# Patient Record
Sex: Female | Born: 1935 | Race: White | Hispanic: No | Smoking: Former smoker
Health system: Southern US, Community
[De-identification: ages and names within clinical notes are randomized; demographics above are authoritative.]

## PROBLEM LIST (undated history)

## (undated) DIAGNOSIS — N289 Disorder of kidney and ureter, unspecified: Secondary | ICD-10-CM

## (undated) DIAGNOSIS — K449 Diaphragmatic hernia without obstruction or gangrene: Secondary | ICD-10-CM

## (undated) DIAGNOSIS — M1990 Unspecified osteoarthritis, unspecified site: Secondary | ICD-10-CM

## (undated) DIAGNOSIS — Z87891 Personal history of nicotine dependence: Secondary | ICD-10-CM

## (undated) DIAGNOSIS — I4891 Unspecified atrial fibrillation: Secondary | ICD-10-CM

## (undated) DIAGNOSIS — D518 Other vitamin B12 deficiency anemias: Secondary | ICD-10-CM

## (undated) DIAGNOSIS — E039 Hypothyroidism, unspecified: Secondary | ICD-10-CM

## (undated) DIAGNOSIS — M8580 Other specified disorders of bone density and structure, unspecified site: Secondary | ICD-10-CM

## (undated) DIAGNOSIS — Z860100 Personal history of colon polyps, unspecified: Secondary | ICD-10-CM

## (undated) DIAGNOSIS — R413 Other amnesia: Secondary | ICD-10-CM

## (undated) DIAGNOSIS — G4733 Obstructive sleep apnea (adult) (pediatric): Secondary | ICD-10-CM

## (undated) DIAGNOSIS — K219 Gastro-esophageal reflux disease without esophagitis: Secondary | ICD-10-CM

## (undated) DIAGNOSIS — D649 Anemia, unspecified: Secondary | ICD-10-CM

## (undated) DIAGNOSIS — Z9989 Dependence on other enabling machines and devices: Secondary | ICD-10-CM

## (undated) DIAGNOSIS — C50919 Malignant neoplasm of unspecified site of unspecified female breast: Secondary | ICD-10-CM

## (undated) DIAGNOSIS — E669 Obesity, unspecified: Secondary | ICD-10-CM

## (undated) DIAGNOSIS — E119 Type 2 diabetes mellitus without complications: Secondary | ICD-10-CM

## (undated) DIAGNOSIS — I10 Essential (primary) hypertension: Secondary | ICD-10-CM

## (undated) DIAGNOSIS — E785 Hyperlipidemia, unspecified: Secondary | ICD-10-CM

## (undated) DIAGNOSIS — I4892 Unspecified atrial flutter: Secondary | ICD-10-CM

## (undated) HISTORY — DX: Unspecified atrial flutter: I48.92

## (undated) HISTORY — DX: Other specified disorders of bone density and structure, unspecified site: M85.80

## (undated) HISTORY — DX: Obstructive sleep apnea (adult) (pediatric): G47.33

## (undated) HISTORY — DX: Other vitamin B12 deficiency anemias: D51.8

## (undated) HISTORY — DX: Anemia, unspecified: D64.9

## (undated) HISTORY — DX: Type 2 diabetes mellitus without complications: E11.9

## (undated) HISTORY — DX: Malignant neoplasm of unspecified site of unspecified female breast: C50.919

## (undated) HISTORY — PX: COLONOSCOPY: SHX174

## (undated) HISTORY — PX: CATARACT EXTRACTION: SUR2

## (undated) HISTORY — DX: Personal history of colonic polyps: Z86.010

## (undated) HISTORY — DX: Personal history of colon polyps, unspecified: Z86.0100

## (undated) HISTORY — DX: Obesity, unspecified: E66.9

## (undated) HISTORY — DX: Personal history of nicotine dependence: Z87.891

## (undated) HISTORY — DX: Hypothyroidism, unspecified: E03.9

## (undated) HISTORY — DX: Dependence on other enabling machines and devices: Z99.89

## (undated) HISTORY — DX: Essential (primary) hypertension: I10

## (undated) HISTORY — DX: Unspecified atrial fibrillation: I48.91

## (undated) HISTORY — DX: Disorder of kidney and ureter, unspecified: N28.9

## (undated) HISTORY — DX: Hyperlipidemia, unspecified: E78.5

---

## 1972-03-10 HISTORY — PX: BOWEL RESECTION: SHX1257

## 1972-03-10 HISTORY — PX: CHOLECYSTECTOMY: SHX55

## 1973-03-10 HISTORY — PX: TOTAL ABDOMINAL HYSTERECTOMY: SHX209

## 1997-12-25 ENCOUNTER — Ambulatory Visit (HOSPITAL_COMMUNITY): Admission: RE | Admit: 1997-12-25 | Discharge: 1997-12-25 | Payer: Self-pay | Admitting: Family Medicine

## 1998-02-07 ENCOUNTER — Ambulatory Visit (HOSPITAL_COMMUNITY): Admission: RE | Admit: 1998-02-07 | Discharge: 1998-02-07 | Payer: Self-pay | Admitting: Family Medicine

## 1998-04-11 ENCOUNTER — Ambulatory Visit (HOSPITAL_COMMUNITY): Admission: RE | Admit: 1998-04-11 | Discharge: 1998-04-11 | Payer: Self-pay | Admitting: Family Medicine

## 1999-03-11 DIAGNOSIS — C50919 Malignant neoplasm of unspecified site of unspecified female breast: Secondary | ICD-10-CM

## 1999-03-11 HISTORY — DX: Malignant neoplasm of unspecified site of unspecified female breast: C50.919

## 1999-03-11 HISTORY — PX: MASTECTOMY: SHX3

## 1999-03-22 ENCOUNTER — Encounter: Admission: RE | Admit: 1999-03-22 | Discharge: 1999-03-22 | Payer: Self-pay | Admitting: Family Medicine

## 1999-03-22 ENCOUNTER — Encounter: Payer: Self-pay | Admitting: Family Medicine

## 1999-04-02 ENCOUNTER — Encounter: Payer: Self-pay | Admitting: General Surgery

## 1999-04-02 ENCOUNTER — Ambulatory Visit (HOSPITAL_COMMUNITY): Admission: RE | Admit: 1999-04-02 | Discharge: 1999-04-02 | Payer: Self-pay | Admitting: General Surgery

## 1999-04-02 ENCOUNTER — Encounter (INDEPENDENT_AMBULATORY_CARE_PROVIDER_SITE_OTHER): Payer: Self-pay | Admitting: Specialist

## 1999-04-16 ENCOUNTER — Encounter: Admission: RE | Admit: 1999-04-16 | Discharge: 1999-04-16 | Payer: Self-pay | Admitting: General Surgery

## 1999-04-16 ENCOUNTER — Encounter: Payer: Self-pay | Admitting: General Surgery

## 1999-05-01 ENCOUNTER — Encounter: Payer: Self-pay | Admitting: General Surgery

## 1999-05-03 ENCOUNTER — Encounter: Payer: Self-pay | Admitting: General Surgery

## 1999-05-03 ENCOUNTER — Inpatient Hospital Stay (HOSPITAL_COMMUNITY): Admission: RE | Admit: 1999-05-03 | Discharge: 1999-05-05 | Payer: Self-pay | Admitting: General Surgery

## 1999-05-03 ENCOUNTER — Encounter (INDEPENDENT_AMBULATORY_CARE_PROVIDER_SITE_OTHER): Payer: Self-pay | Admitting: Specialist

## 1999-05-30 ENCOUNTER — Encounter: Payer: Self-pay | Admitting: *Deleted

## 1999-05-30 ENCOUNTER — Ambulatory Visit (HOSPITAL_COMMUNITY): Admission: RE | Admit: 1999-05-30 | Discharge: 1999-05-30 | Payer: Self-pay | Admitting: *Deleted

## 1999-06-04 ENCOUNTER — Ambulatory Visit (HOSPITAL_COMMUNITY): Admission: RE | Admit: 1999-06-04 | Discharge: 1999-06-04 | Payer: Self-pay | Admitting: General Surgery

## 1999-06-04 ENCOUNTER — Encounter: Payer: Self-pay | Admitting: General Surgery

## 2000-02-18 ENCOUNTER — Encounter: Admission: RE | Admit: 2000-02-18 | Discharge: 2000-02-18 | Payer: Self-pay | Admitting: *Deleted

## 2000-02-18 ENCOUNTER — Encounter: Payer: Self-pay | Admitting: *Deleted

## 2000-05-11 ENCOUNTER — Ambulatory Visit (HOSPITAL_COMMUNITY): Admission: RE | Admit: 2000-05-11 | Discharge: 2000-05-11 | Payer: Self-pay | Admitting: *Deleted

## 2000-05-11 ENCOUNTER — Encounter: Payer: Self-pay | Admitting: *Deleted

## 2000-08-21 ENCOUNTER — Encounter: Admission: RE | Admit: 2000-08-21 | Discharge: 2000-08-21 | Payer: Self-pay | Admitting: Family Medicine

## 2000-08-21 ENCOUNTER — Encounter: Payer: Self-pay | Admitting: Family Medicine

## 2000-10-22 ENCOUNTER — Ambulatory Visit (HOSPITAL_COMMUNITY): Admission: RE | Admit: 2000-10-22 | Discharge: 2000-10-22 | Payer: Self-pay | Admitting: Internal Medicine

## 2000-10-22 ENCOUNTER — Encounter (INDEPENDENT_AMBULATORY_CARE_PROVIDER_SITE_OTHER): Payer: Self-pay | Admitting: Specialist

## 2001-03-30 ENCOUNTER — Encounter (HOSPITAL_COMMUNITY): Payer: Self-pay | Admitting: Oncology

## 2001-03-30 ENCOUNTER — Ambulatory Visit (HOSPITAL_COMMUNITY): Admission: RE | Admit: 2001-03-30 | Discharge: 2001-03-30 | Payer: Self-pay | Admitting: Oncology

## 2002-05-31 ENCOUNTER — Encounter (HOSPITAL_COMMUNITY): Payer: Self-pay | Admitting: Oncology

## 2002-05-31 ENCOUNTER — Ambulatory Visit (HOSPITAL_COMMUNITY): Admission: RE | Admit: 2002-05-31 | Discharge: 2002-05-31 | Payer: Self-pay | Admitting: Oncology

## 2002-08-26 ENCOUNTER — Encounter: Payer: Self-pay | Admitting: Family Medicine

## 2002-08-26 ENCOUNTER — Encounter: Admission: RE | Admit: 2002-08-26 | Discharge: 2002-08-26 | Payer: Self-pay | Admitting: Family Medicine

## 2003-05-31 ENCOUNTER — Ambulatory Visit (HOSPITAL_COMMUNITY): Admission: RE | Admit: 2003-05-31 | Discharge: 2003-05-31 | Payer: Self-pay | Admitting: Oncology

## 2004-01-12 ENCOUNTER — Encounter: Admission: RE | Admit: 2004-01-12 | Discharge: 2004-01-24 | Payer: Self-pay | Admitting: Family Medicine

## 2004-05-28 ENCOUNTER — Ambulatory Visit (HOSPITAL_COMMUNITY): Admission: RE | Admit: 2004-05-28 | Discharge: 2004-05-28 | Payer: Self-pay | Admitting: Oncology

## 2004-05-28 ENCOUNTER — Ambulatory Visit: Payer: Self-pay | Admitting: Oncology

## 2004-05-29 ENCOUNTER — Encounter: Admission: RE | Admit: 2004-05-29 | Discharge: 2004-05-29 | Payer: Self-pay | Admitting: Family Medicine

## 2004-11-25 ENCOUNTER — Ambulatory Visit: Payer: Self-pay | Admitting: Oncology

## 2004-11-26 ENCOUNTER — Ambulatory Visit (HOSPITAL_COMMUNITY): Admission: RE | Admit: 2004-11-26 | Discharge: 2004-11-26 | Payer: Self-pay | Admitting: Oncology

## 2005-05-23 ENCOUNTER — Ambulatory Visit: Payer: Self-pay | Admitting: Oncology

## 2005-05-26 DIAGNOSIS — D518 Other vitamin B12 deficiency anemias: Secondary | ICD-10-CM

## 2005-05-26 HISTORY — DX: Other vitamin B12 deficiency anemias: D51.8

## 2005-07-08 ENCOUNTER — Ambulatory Visit: Payer: Self-pay | Admitting: Oncology

## 2005-09-01 ENCOUNTER — Ambulatory Visit: Payer: Self-pay | Admitting: Oncology

## 2006-05-20 ENCOUNTER — Ambulatory Visit: Payer: Self-pay | Admitting: Oncology

## 2006-05-25 ENCOUNTER — Ambulatory Visit (HOSPITAL_COMMUNITY): Admission: RE | Admit: 2006-05-25 | Discharge: 2006-05-25 | Payer: Self-pay | Admitting: Oncology

## 2006-05-25 LAB — COMPREHENSIVE METABOLIC PANEL
ALT: 9 U/L (ref 0–35)
AST: 11 U/L (ref 0–37)
Albumin: 3.5 g/dL (ref 3.5–5.2)
Alkaline Phosphatase: 43 U/L (ref 39–117)
BUN: 18 mg/dL (ref 6–23)
CO2: 25 mEq/L (ref 19–32)
Calcium: 8.3 mg/dL — ABNORMAL LOW (ref 8.4–10.5)
Chloride: 101 mEq/L (ref 96–112)
Creatinine, Ser: 1.37 mg/dL — ABNORMAL HIGH (ref 0.40–1.20)
Glucose, Bld: 114 mg/dL — ABNORMAL HIGH (ref 70–99)
Potassium: 4 mEq/L (ref 3.5–5.3)
Sodium: 138 mEq/L (ref 135–145)
Total Bilirubin: 0.6 mg/dL (ref 0.3–1.2)
Total Protein: 6.1 g/dL (ref 6.0–8.3)

## 2006-05-25 LAB — CBC WITH DIFFERENTIAL/PLATELET
BASO%: 0.6 % (ref 0.0–2.0)
Basophils Absolute: 0 10*3/uL (ref 0.0–0.1)
EOS%: 2.6 % (ref 0.0–7.0)
Eosinophils Absolute: 0.1 10*3/uL (ref 0.0–0.5)
HCT: 32.6 % — ABNORMAL LOW (ref 34.8–46.6)
HGB: 11.1 g/dL — ABNORMAL LOW (ref 11.6–15.9)
LYMPH%: 16.8 % (ref 14.0–48.0)
MCH: 31.2 pg (ref 26.0–34.0)
MCHC: 34.2 g/dL (ref 32.0–36.0)
MCV: 91.2 fL (ref 81.0–101.0)
MONO#: 0.4 10*3/uL (ref 0.1–0.9)
MONO%: 10.2 % (ref 0.0–13.0)
NEUT#: 2.9 10*3/uL (ref 1.5–6.5)
NEUT%: 69.8 % (ref 39.6–76.8)
Platelets: 250 10*3/uL (ref 145–400)
RBC: 3.57 10*6/uL — ABNORMAL LOW (ref 3.70–5.32)
RDW: 15.5 % — ABNORMAL HIGH (ref 11.3–14.5)
WBC: 4.1 10*3/uL (ref 3.9–10.0)
lymph#: 0.7 10*3/uL — ABNORMAL LOW (ref 0.9–3.3)

## 2006-05-25 LAB — LACTATE DEHYDROGENASE: LDH: 205 U/L (ref 94–250)

## 2006-05-25 LAB — VITAMIN B12: Vitamin B-12: 210 pg/mL — ABNORMAL LOW (ref 211–911)

## 2006-07-16 ENCOUNTER — Ambulatory Visit: Payer: Self-pay | Admitting: Oncology

## 2006-09-09 ENCOUNTER — Ambulatory Visit: Payer: Self-pay | Admitting: Oncology

## 2006-11-05 ENCOUNTER — Ambulatory Visit: Payer: Self-pay | Admitting: Oncology

## 2006-11-23 LAB — LACTATE DEHYDROGENASE: LDH: 179 U/L (ref 94–250)

## 2006-11-23 LAB — COMPREHENSIVE METABOLIC PANEL
ALT: 9 U/L (ref 0–35)
AST: 12 U/L (ref 0–37)
Albumin: 3.7 g/dL (ref 3.5–5.2)
Alkaline Phosphatase: 38 U/L — ABNORMAL LOW (ref 39–117)
BUN: 29 mg/dL — ABNORMAL HIGH (ref 6–23)
CO2: 21 mEq/L (ref 19–32)
Calcium: 8.7 mg/dL (ref 8.4–10.5)
Chloride: 103 mEq/L (ref 96–112)
Creatinine, Ser: 1.43 mg/dL — ABNORMAL HIGH (ref 0.40–1.20)
Glucose, Bld: 114 mg/dL — ABNORMAL HIGH (ref 70–99)
Potassium: 4.8 mEq/L (ref 3.5–5.3)
Sodium: 138 mEq/L (ref 135–145)
Total Bilirubin: 0.5 mg/dL (ref 0.3–1.2)
Total Protein: 6.3 g/dL (ref 6.0–8.3)

## 2006-11-23 LAB — CBC WITH DIFFERENTIAL/PLATELET
BASO%: 0.4 % (ref 0.0–2.0)
Basophils Absolute: 0 10*3/uL (ref 0.0–0.1)
EOS%: 1.9 % (ref 0.0–7.0)
Eosinophils Absolute: 0.1 10*3/uL (ref 0.0–0.5)
HCT: 32.8 % — ABNORMAL LOW (ref 34.8–46.6)
HGB: 11.3 g/dL — ABNORMAL LOW (ref 11.6–15.9)
LYMPH%: 13.4 % — ABNORMAL LOW (ref 14.0–48.0)
MCH: 31.3 pg (ref 26.0–34.0)
MCHC: 34.3 g/dL (ref 32.0–36.0)
MCV: 91.4 fL (ref 81.0–101.0)
MONO#: 0.4 10*3/uL (ref 0.1–0.9)
MONO%: 8.1 % (ref 0.0–13.0)
NEUT#: 3.5 10*3/uL (ref 1.5–6.5)
NEUT%: 76.2 % (ref 39.6–76.8)
Platelets: 255 10*3/uL (ref 145–400)
RBC: 3.59 10*6/uL — ABNORMAL LOW (ref 3.70–5.32)
RDW: 15.8 % — ABNORMAL HIGH (ref 11.3–14.5)
WBC: 4.6 10*3/uL (ref 3.9–10.0)
lymph#: 0.6 10*3/uL — ABNORMAL LOW (ref 0.9–3.3)

## 2006-11-23 LAB — VITAMIN B12: Vitamin B-12: 622 pg/mL (ref 211–911)

## 2007-01-01 ENCOUNTER — Ambulatory Visit: Payer: Self-pay | Admitting: Oncology

## 2007-03-02 ENCOUNTER — Ambulatory Visit: Payer: Self-pay | Admitting: Oncology

## 2007-04-01 ENCOUNTER — Encounter: Admission: RE | Admit: 2007-04-01 | Discharge: 2007-04-01 | Payer: Self-pay | Admitting: Family Medicine

## 2007-04-23 ENCOUNTER — Ambulatory Visit: Payer: Self-pay | Admitting: Oncology

## 2007-05-06 ENCOUNTER — Inpatient Hospital Stay (HOSPITAL_COMMUNITY): Admission: EM | Admit: 2007-05-06 | Discharge: 2007-05-11 | Payer: Self-pay | Admitting: Emergency Medicine

## 2007-05-25 LAB — CBC WITH DIFFERENTIAL/PLATELET
BASO%: 0.2 % (ref 0.0–2.0)
Basophils Absolute: 0 10*3/uL (ref 0.0–0.1)
EOS%: 0.8 % (ref 0.0–7.0)
Eosinophils Absolute: 0 10*3/uL (ref 0.0–0.5)
HCT: 34.7 % — ABNORMAL LOW (ref 34.8–46.6)
HGB: 11.8 g/dL (ref 11.6–15.9)
LYMPH%: 19.9 % (ref 14.0–48.0)
MCH: 30.7 pg (ref 26.0–34.0)
MCHC: 33.9 g/dL (ref 32.0–36.0)
MCV: 90.5 fL (ref 81.0–101.0)
MONO#: 0.6 10*3/uL (ref 0.1–0.9)
MONO%: 13 % (ref 0.0–13.0)
NEUT#: 2.8 10*3/uL (ref 1.5–6.5)
NEUT%: 66.1 % (ref 39.6–76.8)
Platelets: 357 10*3/uL (ref 145–400)
RBC: 3.84 10*6/uL (ref 3.70–5.32)
RDW: 15.3 % — ABNORMAL HIGH (ref 11.3–14.5)
WBC: 4.3 10*3/uL (ref 3.9–10.0)
lymph#: 0.9 10*3/uL (ref 0.9–3.3)

## 2007-05-25 LAB — COMPREHENSIVE METABOLIC PANEL
ALT: 16 U/L (ref 0–35)
AST: 21 U/L (ref 0–37)
Albumin: 3.2 g/dL — ABNORMAL LOW (ref 3.5–5.2)
Alkaline Phosphatase: 63 U/L (ref 39–117)
BUN: 18 mg/dL (ref 6–23)
CO2: 20 mEq/L (ref 19–32)
Calcium: 7.1 mg/dL — ABNORMAL LOW (ref 8.4–10.5)
Chloride: 103 mEq/L (ref 96–112)
Creatinine, Ser: 1.16 mg/dL (ref 0.40–1.20)
Glucose, Bld: 124 mg/dL — ABNORMAL HIGH (ref 70–99)
Potassium: 4.5 mEq/L (ref 3.5–5.3)
Sodium: 136 mEq/L (ref 135–145)
Total Bilirubin: 0.5 mg/dL (ref 0.3–1.2)
Total Protein: 6 g/dL (ref 6.0–8.3)

## 2007-05-25 LAB — LACTATE DEHYDROGENASE: LDH: 264 U/L — ABNORMAL HIGH (ref 94–250)

## 2007-05-25 LAB — VITAMIN B12: Vitamin B-12: 1290 pg/mL — ABNORMAL HIGH (ref 211–911)

## 2007-06-05 ENCOUNTER — Encounter: Admission: RE | Admit: 2007-06-05 | Discharge: 2007-06-05 | Payer: Self-pay | Admitting: Family Medicine

## 2007-06-09 HISTORY — PX: NM MYOCAR PERF WALL MOTION: HXRAD629

## 2007-06-17 ENCOUNTER — Ambulatory Visit: Payer: Self-pay | Admitting: Oncology

## 2007-08-13 ENCOUNTER — Ambulatory Visit: Payer: Self-pay | Admitting: Oncology

## 2007-09-08 DIAGNOSIS — G4733 Obstructive sleep apnea (adult) (pediatric): Secondary | ICD-10-CM

## 2007-09-08 HISTORY — DX: Obstructive sleep apnea (adult) (pediatric): G47.33

## 2007-10-12 ENCOUNTER — Ambulatory Visit: Payer: Self-pay | Admitting: Oncology

## 2007-11-25 LAB — COMPREHENSIVE METABOLIC PANEL
ALT: 14 U/L (ref 0–35)
AST: 13 U/L (ref 0–37)
Albumin: 3.7 g/dL (ref 3.5–5.2)
Alkaline Phosphatase: 35 U/L — ABNORMAL LOW (ref 39–117)
BUN: 36 mg/dL — ABNORMAL HIGH (ref 6–23)
CO2: 27 mEq/L (ref 19–32)
Calcium: 8.7 mg/dL (ref 8.4–10.5)
Chloride: 99 mEq/L (ref 96–112)
Creatinine, Ser: 1.61 mg/dL — ABNORMAL HIGH (ref 0.40–1.20)
Glucose, Bld: 149 mg/dL — ABNORMAL HIGH (ref 70–99)
Potassium: 4.1 mEq/L (ref 3.5–5.3)
Sodium: 138 mEq/L (ref 135–145)
Total Bilirubin: 0.5 mg/dL (ref 0.3–1.2)
Total Protein: 6.1 g/dL (ref 6.0–8.3)

## 2007-11-25 LAB — CBC WITH DIFFERENTIAL/PLATELET
BASO%: 0.9 % (ref 0.0–2.0)
Basophils Absolute: 0 10*3/uL (ref 0.0–0.1)
EOS%: 2.4 % (ref 0.0–7.0)
Eosinophils Absolute: 0.1 10*3/uL (ref 0.0–0.5)
HCT: 30.3 % — ABNORMAL LOW (ref 34.8–46.6)
HGB: 10.3 g/dL — ABNORMAL LOW (ref 11.6–15.9)
LYMPH%: 23.2 % (ref 14.0–48.0)
MCH: 30.9 pg (ref 26.0–34.0)
MCHC: 33.9 g/dL (ref 32.0–36.0)
MCV: 91.2 fL (ref 81.0–101.0)
MONO#: 0.4 10*3/uL (ref 0.1–0.9)
MONO%: 11.3 % (ref 0.0–13.0)
NEUT#: 2.1 10*3/uL (ref 1.5–6.5)
NEUT%: 62.2 % (ref 39.6–76.8)
Platelets: 241 10*3/uL (ref 145–400)
RBC: 3.32 10*6/uL — ABNORMAL LOW (ref 3.70–5.32)
RDW: 15.4 % — ABNORMAL HIGH (ref 11.3–14.5)
WBC: 3.3 10*3/uL — ABNORMAL LOW (ref 3.9–10.0)
lymph#: 0.8 10*3/uL — ABNORMAL LOW (ref 0.9–3.3)

## 2007-11-25 LAB — VITAMIN B12: Vitamin B-12: 677 pg/mL (ref 211–911)

## 2007-11-25 LAB — LACTATE DEHYDROGENASE: LDH: 187 U/L (ref 94–250)

## 2007-12-03 ENCOUNTER — Ambulatory Visit: Payer: Self-pay | Admitting: Oncology

## 2008-02-01 ENCOUNTER — Ambulatory Visit: Payer: Self-pay | Admitting: Oncology

## 2008-03-24 ENCOUNTER — Ambulatory Visit: Payer: Self-pay | Admitting: Oncology

## 2008-05-19 ENCOUNTER — Ambulatory Visit: Payer: Self-pay | Admitting: Oncology

## 2008-05-23 ENCOUNTER — Ambulatory Visit (HOSPITAL_COMMUNITY): Admission: RE | Admit: 2008-05-23 | Discharge: 2008-05-23 | Payer: Self-pay | Admitting: Oncology

## 2008-05-23 LAB — CBC WITH DIFFERENTIAL/PLATELET
BASO%: 0.6 % (ref 0.0–2.0)
Basophils Absolute: 0 10*3/uL (ref 0.0–0.1)
EOS%: 2.7 % (ref 0.0–7.0)
Eosinophils Absolute: 0.1 10*3/uL (ref 0.0–0.5)
HCT: 35 % (ref 34.8–46.6)
HGB: 11.8 g/dL (ref 11.6–15.9)
LYMPH%: 23.1 % (ref 14.0–49.7)
MCH: 30.8 pg (ref 25.1–34.0)
MCHC: 33.8 g/dL (ref 31.5–36.0)
MCV: 91.2 fL (ref 79.5–101.0)
MONO#: 0.5 10*3/uL (ref 0.1–0.9)
MONO%: 10.1 % (ref 0.0–14.0)
NEUT#: 2.8 10*3/uL (ref 1.5–6.5)
NEUT%: 63.5 % (ref 38.4–76.8)
Platelets: 231 10*3/uL (ref 145–400)
RBC: 3.84 10*6/uL (ref 3.70–5.45)
RDW: 14.6 % — ABNORMAL HIGH (ref 11.2–14.5)
WBC: 4.5 10*3/uL (ref 3.9–10.3)
lymph#: 1 10*3/uL (ref 0.9–3.3)

## 2008-05-23 LAB — COMPREHENSIVE METABOLIC PANEL
ALT: 15 U/L (ref 0–35)
AST: 15 U/L (ref 0–37)
Albumin: 3.9 g/dL (ref 3.5–5.2)
Alkaline Phosphatase: 42 U/L (ref 39–117)
BUN: 40 mg/dL — ABNORMAL HIGH (ref 6–23)
CO2: 23 mEq/L (ref 19–32)
Calcium: 9 mg/dL (ref 8.4–10.5)
Chloride: 101 mEq/L (ref 96–112)
Creatinine, Ser: 1.71 mg/dL — ABNORMAL HIGH (ref 0.40–1.20)
Glucose, Bld: 160 mg/dL — ABNORMAL HIGH (ref 70–99)
Potassium: 4.1 mEq/L (ref 3.5–5.3)
Sodium: 139 mEq/L (ref 135–145)
Total Bilirubin: 0.5 mg/dL (ref 0.3–1.2)
Total Protein: 6.6 g/dL (ref 6.0–8.3)

## 2008-05-23 LAB — VITAMIN B12: Vitamin B-12: 710 pg/mL (ref 211–911)

## 2008-05-23 LAB — LACTATE DEHYDROGENASE: LDH: 187 U/L (ref 94–250)

## 2008-07-17 ENCOUNTER — Ambulatory Visit: Payer: Self-pay | Admitting: Oncology

## 2008-09-08 ENCOUNTER — Ambulatory Visit: Payer: Self-pay | Admitting: Oncology

## 2008-10-09 ENCOUNTER — Ambulatory Visit: Payer: Self-pay | Admitting: Oncology

## 2008-11-30 ENCOUNTER — Ambulatory Visit: Payer: Self-pay | Admitting: Oncology

## 2008-12-05 LAB — COMPREHENSIVE METABOLIC PANEL
ALT: 11 U/L (ref 0–35)
AST: 12 U/L (ref 0–37)
Albumin: 3.5 g/dL (ref 3.5–5.2)
Alkaline Phosphatase: 50 U/L (ref 39–117)
BUN: 32 mg/dL — ABNORMAL HIGH (ref 6–23)
CO2: 24 mEq/L (ref 19–32)
Calcium: 8.6 mg/dL (ref 8.4–10.5)
Chloride: 105 mEq/L (ref 96–112)
Creatinine, Ser: 2.25 mg/dL — ABNORMAL HIGH (ref 0.40–1.20)
Glucose, Bld: 148 mg/dL — ABNORMAL HIGH (ref 70–99)
Potassium: 4.7 mEq/L (ref 3.5–5.3)
Sodium: 142 mEq/L (ref 135–145)
Total Bilirubin: 0.5 mg/dL (ref 0.3–1.2)
Total Protein: 6.1 g/dL (ref 6.0–8.3)

## 2008-12-05 LAB — CBC WITH DIFFERENTIAL/PLATELET
BASO%: 0.5 % (ref 0.0–2.0)
Basophils Absolute: 0 10*3/uL (ref 0.0–0.1)
EOS%: 3 % (ref 0.0–7.0)
Eosinophils Absolute: 0.1 10*3/uL (ref 0.0–0.5)
HCT: 27.8 % — ABNORMAL LOW (ref 34.8–46.6)
HGB: 9.6 g/dL — ABNORMAL LOW (ref 11.6–15.9)
LYMPH%: 16.6 % (ref 14.0–49.7)
MCH: 31.7 pg (ref 25.1–34.0)
MCHC: 34.5 g/dL (ref 31.5–36.0)
MCV: 91.9 fL (ref 79.5–101.0)
MONO#: 0.5 10*3/uL (ref 0.1–0.9)
MONO%: 12.3 % (ref 0.0–14.0)
NEUT#: 2.8 10*3/uL (ref 1.5–6.5)
NEUT%: 67.6 % (ref 38.4–76.8)
Platelets: 208 10*3/uL (ref 145–400)
RBC: 3.02 10*6/uL — ABNORMAL LOW (ref 3.70–5.45)
RDW: 14.9 % — ABNORMAL HIGH (ref 11.2–14.5)
WBC: 4.2 10*3/uL (ref 3.9–10.3)
lymph#: 0.7 10*3/uL — ABNORMAL LOW (ref 0.9–3.3)

## 2008-12-05 LAB — LACTATE DEHYDROGENASE: LDH: 236 U/L (ref 94–250)

## 2008-12-05 LAB — VITAMIN B12: Vitamin B-12: 677 pg/mL (ref 211–911)

## 2008-12-08 ENCOUNTER — Encounter (HOSPITAL_COMMUNITY): Admission: RE | Admit: 2008-12-08 | Discharge: 2009-03-08 | Payer: Self-pay | Admitting: Nephrology

## 2009-01-02 ENCOUNTER — Ambulatory Visit: Payer: Self-pay | Admitting: Oncology

## 2009-02-23 ENCOUNTER — Ambulatory Visit: Payer: Self-pay | Admitting: Oncology

## 2009-03-26 ENCOUNTER — Ambulatory Visit: Payer: Self-pay | Admitting: Oncology

## 2009-05-18 ENCOUNTER — Ambulatory Visit: Payer: Self-pay | Admitting: Oncology

## 2009-05-20 ENCOUNTER — Emergency Department (HOSPITAL_COMMUNITY)
Admission: EM | Admit: 2009-05-20 | Discharge: 2009-05-21 | Payer: Self-pay | Source: Home / Self Care | Admitting: Emergency Medicine

## 2009-05-22 ENCOUNTER — Emergency Department (HOSPITAL_COMMUNITY): Admission: EM | Admit: 2009-05-22 | Discharge: 2009-05-22 | Payer: Self-pay | Admitting: Emergency Medicine

## 2009-05-28 ENCOUNTER — Inpatient Hospital Stay (HOSPITAL_COMMUNITY): Admission: EM | Admit: 2009-05-28 | Discharge: 2009-06-04 | Payer: Self-pay | Admitting: Orthopedic Surgery

## 2009-05-30 ENCOUNTER — Encounter (INDEPENDENT_AMBULATORY_CARE_PROVIDER_SITE_OTHER): Payer: Self-pay | Admitting: Orthopedic Surgery

## 2009-05-30 HISTORY — PX: BACK SURGERY: SHX140

## 2009-05-31 ENCOUNTER — Ambulatory Visit: Payer: Self-pay | Admitting: Physical Medicine & Rehabilitation

## 2009-07-05 ENCOUNTER — Inpatient Hospital Stay (HOSPITAL_COMMUNITY): Admission: EM | Admit: 2009-07-05 | Discharge: 2009-07-09 | Payer: Self-pay | Admitting: Emergency Medicine

## 2009-07-05 ENCOUNTER — Ambulatory Visit: Payer: Self-pay | Admitting: Surgery

## 2009-07-05 ENCOUNTER — Encounter (INDEPENDENT_AMBULATORY_CARE_PROVIDER_SITE_OTHER): Payer: Self-pay | Admitting: Internal Medicine

## 2009-07-06 ENCOUNTER — Encounter (INDEPENDENT_AMBULATORY_CARE_PROVIDER_SITE_OTHER): Payer: Self-pay | Admitting: Internal Medicine

## 2010-01-18 ENCOUNTER — Ambulatory Visit: Payer: Self-pay | Admitting: Oncology

## 2010-01-22 LAB — CBC WITH DIFFERENTIAL/PLATELET
BASO%: 0.5 % (ref 0.0–2.0)
Basophils Absolute: 0 10*3/uL (ref 0.0–0.1)
EOS%: 3.2 % (ref 0.0–7.0)
Eosinophils Absolute: 0.2 10*3/uL (ref 0.0–0.5)
HCT: 37.7 % (ref 34.8–46.6)
HGB: 12.7 g/dL (ref 11.6–15.9)
LYMPH%: 20.9 % (ref 14.0–49.7)
MCH: 30.5 pg (ref 25.1–34.0)
MCHC: 33.6 g/dL (ref 31.5–36.0)
MCV: 90.8 fL (ref 79.5–101.0)
MONO#: 0.5 10*3/uL (ref 0.1–0.9)
MONO%: 9 % (ref 0.0–14.0)
NEUT#: 3.9 10*3/uL (ref 1.5–6.5)
NEUT%: 66.4 % (ref 38.4–76.8)
Platelets: 249 10*3/uL (ref 145–400)
RBC: 4.15 10*6/uL (ref 3.70–5.45)
RDW: 13.7 % (ref 11.2–14.5)
WBC: 5.9 10*3/uL (ref 3.9–10.3)
lymph#: 1.2 10*3/uL (ref 0.9–3.3)

## 2010-01-22 LAB — LACTATE DEHYDROGENASE: LDH: 205 U/L (ref 94–250)

## 2010-01-22 LAB — COMPREHENSIVE METABOLIC PANEL
ALT: 17 U/L (ref 0–35)
AST: 20 U/L (ref 0–37)
Albumin: 4 g/dL (ref 3.5–5.2)
Alkaline Phosphatase: 95 U/L (ref 39–117)
BUN: 23 mg/dL (ref 6–23)
CO2: 27 mEq/L (ref 19–32)
Calcium: 9 mg/dL (ref 8.4–10.5)
Chloride: 101 mEq/L (ref 96–112)
Creatinine, Ser: 1.59 mg/dL — ABNORMAL HIGH (ref 0.40–1.20)
Glucose, Bld: 240 mg/dL — ABNORMAL HIGH (ref 70–99)
Potassium: 3.9 mEq/L (ref 3.5–5.3)
Sodium: 138 mEq/L (ref 135–145)
Total Bilirubin: 0.5 mg/dL (ref 0.3–1.2)
Total Protein: 6.8 g/dL (ref 6.0–8.3)

## 2010-01-22 LAB — VITAMIN B12: Vitamin B-12: 726 pg/mL (ref 211–911)

## 2010-02-07 HISTORY — PX: TRANSTHORACIC ECHOCARDIOGRAM: SHX275

## 2010-03-19 ENCOUNTER — Ambulatory Visit: Payer: Self-pay | Admitting: Oncology

## 2010-03-20 ENCOUNTER — Encounter
Admission: RE | Admit: 2010-03-20 | Discharge: 2010-04-09 | Payer: Self-pay | Source: Home / Self Care | Attending: Family Medicine | Admitting: Family Medicine

## 2010-04-16 ENCOUNTER — Encounter (HOSPITAL_BASED_OUTPATIENT_CLINIC_OR_DEPARTMENT_OTHER): Payer: Medicare Other | Admitting: Oncology

## 2010-04-16 DIAGNOSIS — E538 Deficiency of other specified B group vitamins: Secondary | ICD-10-CM

## 2010-04-25 ENCOUNTER — Encounter: Payer: Medicare Other | Attending: Family Medicine

## 2010-04-25 DIAGNOSIS — Z713 Dietary counseling and surveillance: Secondary | ICD-10-CM | POA: Insufficient documentation

## 2010-04-25 DIAGNOSIS — E119 Type 2 diabetes mellitus without complications: Secondary | ICD-10-CM | POA: Insufficient documentation

## 2010-05-02 ENCOUNTER — Encounter: Payer: Medicare Other | Attending: Family Medicine

## 2010-05-02 DIAGNOSIS — Z713 Dietary counseling and surveillance: Secondary | ICD-10-CM | POA: Insufficient documentation

## 2010-05-02 DIAGNOSIS — E119 Type 2 diabetes mellitus without complications: Secondary | ICD-10-CM | POA: Insufficient documentation

## 2010-05-14 ENCOUNTER — Encounter (HOSPITAL_BASED_OUTPATIENT_CLINIC_OR_DEPARTMENT_OTHER): Payer: Medicare Other | Admitting: Oncology

## 2010-05-14 DIAGNOSIS — E538 Deficiency of other specified B group vitamins: Secondary | ICD-10-CM

## 2010-05-23 ENCOUNTER — Ambulatory Visit: Payer: Self-pay

## 2010-05-28 LAB — BASIC METABOLIC PANEL
BUN: 26 mg/dL — ABNORMAL HIGH (ref 6–23)
BUN: 26 mg/dL — ABNORMAL HIGH (ref 6–23)
BUN: 27 mg/dL — ABNORMAL HIGH (ref 6–23)
BUN: 28 mg/dL — ABNORMAL HIGH (ref 6–23)
CO2: 25 mEq/L (ref 19–32)
CO2: 26 mEq/L (ref 19–32)
CO2: 26 mEq/L (ref 19–32)
CO2: 27 mEq/L (ref 19–32)
Calcium: 7.9 mg/dL — ABNORMAL LOW (ref 8.4–10.5)
Calcium: 8.1 mg/dL — ABNORMAL LOW (ref 8.4–10.5)
Calcium: 8.3 mg/dL — ABNORMAL LOW (ref 8.4–10.5)
Calcium: 8.4 mg/dL (ref 8.4–10.5)
Chloride: 100 mEq/L (ref 96–112)
Chloride: 105 mEq/L (ref 96–112)
Chloride: 100 mEq/L (ref 96–112)
Chloride: 103 mEq/L (ref 96–112)
Creatinine, Ser: 1.86 mg/dL — ABNORMAL HIGH (ref 0.4–1.2)
Creatinine, Ser: 1.94 mg/dL — ABNORMAL HIGH (ref 0.4–1.2)
Creatinine, Ser: 2.02 mg/dL — ABNORMAL HIGH (ref 0.4–1.2)
Creatinine, Ser: 2.24 mg/dL — ABNORMAL HIGH (ref 0.4–1.2)
GFR calc Af Amer: 31 mL/min — ABNORMAL LOW (ref >60)
GFR calc Af Amer: 32 mL/min — ABNORMAL LOW (ref >60)
GFR calc Af Amer: 26 mL/min — ABNORMAL LOW (ref >60)
GFR calc Af Amer: 29 mL/min — ABNORMAL LOW (ref >60)
GFR calc non Af Amer: 25 mL/min — ABNORMAL LOW (ref >60)
GFR calc non Af Amer: 27 mL/min — ABNORMAL LOW (ref >60)
GFR calc non Af Amer: 21 mL/min — ABNORMAL LOW (ref >60)
GFR calc non Af Amer: 24 mL/min — ABNORMAL LOW (ref >60)
Glucose, Bld: 138 mg/dL — ABNORMAL HIGH (ref 70–99)
Glucose, Bld: 92 mg/dL (ref 70–99)
Glucose, Bld: 110 mg/dL — ABNORMAL HIGH (ref 70–99)
Glucose, Bld: 146 mg/dL — ABNORMAL HIGH (ref 70–99)
Potassium: 4.6 mEq/L (ref 3.5–5.1)
Potassium: 5 mEq/L (ref 3.5–5.1)
Potassium: 4.1 mEq/L (ref 3.5–5.1)
Potassium: 4.1 mEq/L (ref 3.5–5.1)
Sodium: 134 mEq/L — ABNORMAL LOW (ref 135–145)
Sodium: 134 mEq/L — ABNORMAL LOW (ref 135–145)
Sodium: 134 mEq/L — ABNORMAL LOW (ref 135–145)
Sodium: 136 mEq/L (ref 135–145)

## 2010-05-28 LAB — GLUCOSE, CAPILLARY
Glucose-Capillary: 100 mg/dL — ABNORMAL HIGH (ref 70–99)
Glucose-Capillary: 140 mg/dL — ABNORMAL HIGH (ref 70–99)
Glucose-Capillary: 141 mg/dL — ABNORMAL HIGH (ref 70–99)
Glucose-Capillary: 171 mg/dL — ABNORMAL HIGH (ref 70–99)
Glucose-Capillary: 182 mg/dL — ABNORMAL HIGH (ref 70–99)
Glucose-Capillary: 74 mg/dL (ref 70–99)
Glucose-Capillary: 111 mg/dL — ABNORMAL HIGH (ref 70–99)
Glucose-Capillary: 113 mg/dL — ABNORMAL HIGH (ref 70–99)
Glucose-Capillary: 113 mg/dL — ABNORMAL HIGH (ref 70–99)
Glucose-Capillary: 132 mg/dL — ABNORMAL HIGH (ref 70–99)
Glucose-Capillary: 139 mg/dL — ABNORMAL HIGH (ref 70–99)
Glucose-Capillary: 151 mg/dL — ABNORMAL HIGH (ref 70–99)
Glucose-Capillary: 157 mg/dL — ABNORMAL HIGH (ref 70–99)
Glucose-Capillary: 159 mg/dL — ABNORMAL HIGH (ref 70–99)
Glucose-Capillary: 163 mg/dL — ABNORMAL HIGH (ref 70–99)
Glucose-Capillary: 168 mg/dL — ABNORMAL HIGH (ref 70–99)
Glucose-Capillary: 175 mg/dL — ABNORMAL HIGH (ref 70–99)
Glucose-Capillary: 212 mg/dL — ABNORMAL HIGH (ref 70–99)
Glucose-Capillary: 72 mg/dL (ref 70–99)
Glucose-Capillary: 77 mg/dL (ref 70–99)

## 2010-05-28 LAB — CBC
HCT: 26.3 % — ABNORMAL LOW (ref 36.0–46.0)
HCT: 24.8 % — ABNORMAL LOW (ref 36.0–46.0)
HCT: 25.9 % — ABNORMAL LOW (ref 36.0–46.0)
HCT: 28 % — ABNORMAL LOW (ref 36.0–46.0)
HCT: 28.8 % — ABNORMAL LOW (ref 36.0–46.0)
Hemoglobin: 8.7 g/dL — ABNORMAL LOW (ref 12.0–15.0)
Hemoglobin: 8.4 g/dL — ABNORMAL LOW (ref 12.0–15.0)
Hemoglobin: 8.6 g/dL — ABNORMAL LOW (ref 12.0–15.0)
Hemoglobin: 9.2 g/dL — ABNORMAL LOW (ref 12.0–15.0)
Hemoglobin: 9.8 g/dL — ABNORMAL LOW (ref 12.0–15.0)
MCHC: 33 g/dL (ref 30.0–36.0)
MCHC: 33 g/dL (ref 30.0–36.0)
MCHC: 33.3 g/dL (ref 30.0–36.0)
MCHC: 33.9 g/dL (ref 30.0–36.0)
MCHC: 34 g/dL (ref 30.0–36.0)
MCV: 93.9 fL (ref 78.0–100.0)
MCV: 92.8 fL (ref 78.0–100.0)
MCV: 92.8 fL (ref 78.0–100.0)
MCV: 93.2 fL (ref 78.0–100.0)
MCV: 93.6 fL (ref 78.0–100.0)
Platelets: 220 10*3/uL (ref 150–400)
Platelets: 209 10*3/uL (ref 150–400)
Platelets: 215 10*3/uL (ref 150–400)
Platelets: 245 10*3/uL (ref 150–400)
Platelets: 252 10*3/uL (ref 150–400)
RBC: 2.8 MIL/uL — ABNORMAL LOW (ref 3.87–5.11)
RBC: 2.67 MIL/uL — ABNORMAL LOW (ref 3.87–5.11)
RBC: 2.79 MIL/uL — ABNORMAL LOW (ref 3.87–5.11)
RBC: 3 MIL/uL — ABNORMAL LOW (ref 3.87–5.11)
RBC: 3.08 MIL/uL — ABNORMAL LOW (ref 3.87–5.11)
RDW: 17.5 % — ABNORMAL HIGH (ref 11.5–15.5)
RDW: 17.6 % — ABNORMAL HIGH (ref 11.5–15.5)
RDW: 17.6 % — ABNORMAL HIGH (ref 11.5–15.5)
RDW: 17.7 % — ABNORMAL HIGH (ref 11.5–15.5)
RDW: 17.9 % — ABNORMAL HIGH (ref 11.5–15.5)
WBC: 6.2 10*3/uL (ref 4.0–10.5)
WBC: 5.3 10*3/uL (ref 4.0–10.5)
WBC: 5.7 10*3/uL (ref 4.0–10.5)
WBC: 6.7 10*3/uL (ref 4.0–10.5)
WBC: 7.2 10*3/uL (ref 4.0–10.5)

## 2010-05-28 LAB — RETICULOCYTES
RBC.: 3.17 MIL/uL — ABNORMAL LOW (ref 3.87–5.11)
Retic Count, Absolute: 47.6 10*3/uL (ref 19.0–186.0)
Retic Ct Pct: 1.5 % (ref 0.4–3.1)

## 2010-05-28 LAB — DIFFERENTIAL
Basophils Absolute: 0 10*3/uL (ref 0.0–0.1)
Basophils Absolute: 0 10*3/uL (ref 0.0–0.1)
Basophils Relative: 0 % (ref 0–1)
Basophils Relative: 0 % (ref 0–1)
Eosinophils Absolute: 0.1 10*3/uL (ref 0.0–0.7)
Eosinophils Absolute: 0.1 10*3/uL (ref 0.0–0.7)
Eosinophils Relative: 1 % (ref 0–5)
Eosinophils Relative: 2 % (ref 0–5)
Lymphocytes Relative: 12 % (ref 12–46)
Lymphocytes Relative: 8 % — ABNORMAL LOW (ref 12–46)
Lymphs Abs: 0.5 10*3/uL — ABNORMAL LOW (ref 0.7–4.0)
Lymphs Abs: 0.8 10*3/uL (ref 0.7–4.0)
Monocytes Absolute: 0.7 10*3/uL (ref 0.1–1.0)
Monocytes Absolute: 1 10*3/uL (ref 0.1–1.0)
Monocytes Relative: 13 % — ABNORMAL HIGH (ref 3–12)
Monocytes Relative: 15 % — ABNORMAL HIGH (ref 3–12)
Neutro Abs: 4.4 10*3/uL (ref 1.7–7.7)
Neutro Abs: 4.8 10*3/uL (ref 1.7–7.7)
Neutrophils Relative %: 72 % (ref 43–77)
Neutrophils Relative %: 78 % — ABNORMAL HIGH (ref 43–77)

## 2010-05-28 LAB — URINALYSIS, ROUTINE W REFLEX MICROSCOPIC
Bilirubin Urine: NEGATIVE
Glucose, UA: NEGATIVE mg/dL
Hgb urine dipstick: NEGATIVE
Ketones, ur: NEGATIVE mg/dL
Nitrite: NEGATIVE
Protein, ur: NEGATIVE mg/dL
Specific Gravity, Urine: 1.007 (ref 1.005–1.030)
Urobilinogen, UA: 0.2 mg/dL (ref 0.0–1.0)
pH: 5.5 (ref 5.0–8.0)

## 2010-05-28 LAB — COMPREHENSIVE METABOLIC PANEL
ALT: 14 U/L (ref 0–35)
AST: 17 U/L (ref 0–37)
Albumin: 2.8 g/dL — ABNORMAL LOW (ref 3.5–5.2)
Alkaline Phosphatase: 55 U/L (ref 39–117)
BUN: 32 mg/dL — ABNORMAL HIGH (ref 6–23)
CO2: 26 mEq/L (ref 19–32)
Calcium: 8.5 mg/dL (ref 8.4–10.5)
Chloride: 100 mEq/L (ref 96–112)
Creatinine, Ser: 2.47 mg/dL — ABNORMAL HIGH (ref 0.4–1.2)
GFR calc Af Amer: 23 mL/min — ABNORMAL LOW (ref >60)
GFR calc non Af Amer: 19 mL/min — ABNORMAL LOW (ref >60)
Glucose, Bld: 148 mg/dL — ABNORMAL HIGH (ref 70–99)
Potassium: 3.9 mEq/L (ref 3.5–5.1)
Sodium: 134 mEq/L — ABNORMAL LOW (ref 135–145)
Total Bilirubin: 0.9 mg/dL (ref 0.3–1.2)
Total Protein: 5.3 g/dL — ABNORMAL LOW (ref 6.0–8.3)

## 2010-05-28 LAB — LIPID PANEL
Cholesterol: 115 mg/dL (ref 0–200)
HDL: 42 mg/dL (ref >39)
LDL Cholesterol: 51 mg/dL (ref 0–99)
Total CHOL/HDL Ratio: 2.7 RATIO
Triglycerides: 108 mg/dL (ref <150)
VLDL: 22 mg/dL (ref 0–40)

## 2010-05-28 LAB — HEMOGLOBIN A1C
Hgb A1c MFr Bld: 6.1 % — ABNORMAL HIGH (ref <5.7)
Mean Plasma Glucose: 128 mg/dL — ABNORMAL HIGH (ref <117)

## 2010-05-28 LAB — POCT CARDIAC MARKERS
CKMB, poc: <1 ng/mL — ABNORMAL LOW (ref 1.0–8.0)
Myoglobin, poc: 101 ng/mL (ref 12–200)
Troponin i, poc: <0.05 ng/mL (ref 0.00–0.09)

## 2010-05-28 LAB — CARDIAC PANEL(CRET KIN+CKTOT+MB+TROPI)
CK, MB: 0.8 ng/mL (ref 0.3–4.0)
CK, MB: 0.9 ng/mL (ref 0.3–4.0)
CK, MB: 0.9 ng/mL (ref 0.3–4.0)
Relative Index: INVALID (ref 0.0–2.5)
Relative Index: INVALID (ref 0.0–2.5)
Relative Index: INVALID (ref 0.0–2.5)
Total CK: 37 U/L (ref 7–177)
Total CK: 41 U/L (ref 7–177)
Total CK: 44 U/L (ref 7–177)
Troponin I: 0.01 ng/mL (ref 0.00–0.06)
Troponin I: 0.02 ng/mL (ref 0.00–0.06)
Troponin I: 0.02 ng/mL (ref 0.00–0.06)

## 2010-05-28 LAB — FOLATE: Folate: 9.9 ng/mL

## 2010-05-28 LAB — FERRITIN: Ferritin: 260 ng/mL (ref 10–291)

## 2010-05-28 LAB — IRON AND TIBC
Iron: 44 ug/dL (ref 42–135)
Saturation Ratios: 17 % — ABNORMAL LOW (ref 20–55)
TIBC: 265 ug/dL (ref 250–470)
UIBC: 221 ug/dL

## 2010-05-28 LAB — VITAMIN B12: Vitamin B-12: 463 pg/mL (ref 211–911)

## 2010-05-28 LAB — POCT I-STAT, CHEM 8
BUN: 37 mg/dL — ABNORMAL HIGH (ref 6–23)
Calcium, Ion: 1.09 mmol/L — ABNORMAL LOW (ref 1.12–1.32)
Chloride: 100 mEq/L (ref 96–112)
Creatinine, Ser: 2.7 mg/dL — ABNORMAL HIGH (ref 0.4–1.2)
Glucose, Bld: 173 mg/dL — ABNORMAL HIGH (ref 70–99)
HCT: 27 % — ABNORMAL LOW (ref 36.0–46.0)
Hemoglobin: 9.2 g/dL — ABNORMAL LOW (ref 12.0–15.0)
Potassium: 4.6 mEq/L (ref 3.5–5.1)
Sodium: 130 mEq/L — ABNORMAL LOW (ref 135–145)
TCO2: 23 mmol/L (ref 0–100)

## 2010-05-28 LAB — URINE CULTURE: Colony Count: >=100000

## 2010-05-28 LAB — HEMOCCULT GUIAC POC 1CARD (OFFICE): Fecal Occult Bld: NEGATIVE

## 2010-05-28 LAB — TSH: TSH: 3.045 u[IU]/mL (ref 0.350–4.500)

## 2010-05-28 LAB — BRAIN NATRIURETIC PEPTIDE: Pro B Natriuretic peptide (BNP): 202 pg/mL — ABNORMAL HIGH (ref 0.0–100.0)

## 2010-06-03 LAB — COMPREHENSIVE METABOLIC PANEL
ALT: 19 U/L (ref 0–35)
AST: 22 U/L (ref 0–37)
Albumin: 2.9 g/dL — ABNORMAL LOW (ref 3.5–5.2)
Alkaline Phosphatase: 115 U/L (ref 39–117)
BUN: 46 mg/dL — ABNORMAL HIGH (ref 6–23)
CO2: 26 mEq/L (ref 19–32)
Calcium: 9 mg/dL (ref 8.4–10.5)
Chloride: 95 mEq/L — ABNORMAL LOW (ref 96–112)
Creatinine, Ser: 2.05 mg/dL — ABNORMAL HIGH (ref 0.4–1.2)
GFR calc Af Amer: 29 mL/min — ABNORMAL LOW (ref >60)
GFR calc non Af Amer: 24 mL/min — ABNORMAL LOW (ref >60)
Glucose, Bld: 238 mg/dL — ABNORMAL HIGH (ref 70–99)
Potassium: 5.3 mEq/L — ABNORMAL HIGH (ref 3.5–5.1)
Sodium: 131 mEq/L — ABNORMAL LOW (ref 135–145)
Total Bilirubin: 0.8 mg/dL (ref 0.3–1.2)
Total Protein: 6.5 g/dL (ref 6.0–8.3)

## 2010-06-03 LAB — GLUCOSE, CAPILLARY
Glucose-Capillary: 100 mg/dL — ABNORMAL HIGH (ref 70–99)
Glucose-Capillary: 100 mg/dL — ABNORMAL HIGH (ref 70–99)
Glucose-Capillary: 105 mg/dL — ABNORMAL HIGH (ref 70–99)
Glucose-Capillary: 106 mg/dL — ABNORMAL HIGH (ref 70–99)
Glucose-Capillary: 112 mg/dL — ABNORMAL HIGH (ref 70–99)
Glucose-Capillary: 120 mg/dL — ABNORMAL HIGH (ref 70–99)
Glucose-Capillary: 125 mg/dL — ABNORMAL HIGH (ref 70–99)
Glucose-Capillary: 132 mg/dL — ABNORMAL HIGH (ref 70–99)
Glucose-Capillary: 133 mg/dL — ABNORMAL HIGH (ref 70–99)
Glucose-Capillary: 133 mg/dL — ABNORMAL HIGH (ref 70–99)
Glucose-Capillary: 137 mg/dL — ABNORMAL HIGH (ref 70–99)
Glucose-Capillary: 147 mg/dL — ABNORMAL HIGH (ref 70–99)
Glucose-Capillary: 149 mg/dL — ABNORMAL HIGH (ref 70–99)
Glucose-Capillary: 153 mg/dL — ABNORMAL HIGH (ref 70–99)
Glucose-Capillary: 158 mg/dL — ABNORMAL HIGH (ref 70–99)
Glucose-Capillary: 161 mg/dL — ABNORMAL HIGH (ref 70–99)
Glucose-Capillary: 171 mg/dL — ABNORMAL HIGH (ref 70–99)
Glucose-Capillary: 178 mg/dL — ABNORMAL HIGH (ref 70–99)
Glucose-Capillary: 179 mg/dL — ABNORMAL HIGH (ref 70–99)
Glucose-Capillary: 187 mg/dL — ABNORMAL HIGH (ref 70–99)
Glucose-Capillary: 189 mg/dL — ABNORMAL HIGH (ref 70–99)
Glucose-Capillary: 200 mg/dL — ABNORMAL HIGH (ref 70–99)
Glucose-Capillary: 211 mg/dL — ABNORMAL HIGH (ref 70–99)
Glucose-Capillary: 240 mg/dL — ABNORMAL HIGH (ref 70–99)
Glucose-Capillary: 252 mg/dL — ABNORMAL HIGH (ref 70–99)
Glucose-Capillary: 253 mg/dL — ABNORMAL HIGH (ref 70–99)
Glucose-Capillary: 92 mg/dL (ref 70–99)

## 2010-06-03 LAB — DIFFERENTIAL
Basophils Absolute: 0 10*3/uL (ref 0.0–0.1)
Basophils Relative: 0 % (ref 0–1)
Eosinophils Absolute: 0 10*3/uL (ref 0.0–0.7)
Eosinophils Relative: 1 % (ref 0–5)
Lymphocytes Relative: 8 % — ABNORMAL LOW (ref 12–46)
Lymphs Abs: 0.6 10*3/uL — ABNORMAL LOW (ref 0.7–4.0)
Monocytes Absolute: 0.6 10*3/uL (ref 0.1–1.0)
Monocytes Relative: 8 % (ref 3–12)
Neutro Abs: 6.1 10*3/uL (ref 1.7–7.7)
Neutrophils Relative %: 83 % — ABNORMAL HIGH (ref 43–77)

## 2010-06-03 LAB — BASIC METABOLIC PANEL
BUN: 23 mg/dL (ref 6–23)
CO2: 22 mEq/L (ref 19–32)
Calcium: 8.4 mg/dL (ref 8.4–10.5)
Chloride: 101 mEq/L (ref 96–112)
Creatinine, Ser: 1.53 mg/dL — ABNORMAL HIGH (ref 0.4–1.2)
GFR calc Af Amer: 40 mL/min — ABNORMAL LOW (ref >60)
GFR calc non Af Amer: 33 mL/min — ABNORMAL LOW (ref >60)
Glucose, Bld: 119 mg/dL — ABNORMAL HIGH (ref 70–99)
Potassium: 3.8 mEq/L (ref 3.5–5.1)
Sodium: 133 mEq/L — ABNORMAL LOW (ref 135–145)

## 2010-06-03 LAB — URINALYSIS, ROUTINE W REFLEX MICROSCOPIC
Glucose, UA: NEGATIVE mg/dL
Hgb urine dipstick: NEGATIVE
Ketones, ur: NEGATIVE mg/dL
Nitrite: NEGATIVE
Protein, ur: NEGATIVE mg/dL
Specific Gravity, Urine: 1.02 (ref 1.005–1.030)
Urobilinogen, UA: 0.2 mg/dL (ref 0.0–1.0)
pH: 5 (ref 5.0–8.0)

## 2010-06-03 LAB — URINE MICROSCOPIC-ADD ON

## 2010-06-03 LAB — CBC
HCT: 35.9 % — ABNORMAL LOW (ref 36.0–46.0)
HCT: 37.8 % (ref 36.0–46.0)
Hemoglobin: 11.8 g/dL — ABNORMAL LOW (ref 12.0–15.0)
Hemoglobin: 12.7 g/dL (ref 12.0–15.0)
MCHC: 33 g/dL (ref 30.0–36.0)
MCHC: 33.6 g/dL (ref 30.0–36.0)
MCV: 92.4 fL (ref 78.0–100.0)
MCV: 92.8 fL (ref 78.0–100.0)
Platelets: 267 10*3/uL (ref 150–400)
Platelets: 298 10*3/uL (ref 150–400)
RBC: 3.87 MIL/uL (ref 3.87–5.11)
RBC: 4.09 MIL/uL (ref 3.87–5.11)
RDW: 17.3 % — ABNORMAL HIGH (ref 11.5–15.5)
RDW: 17.5 % — ABNORMAL HIGH (ref 11.5–15.5)
WBC: 4.7 10*3/uL (ref 4.0–10.5)
WBC: 7.3 10*3/uL (ref 4.0–10.5)

## 2010-06-03 LAB — PROTIME-INR
INR: 1.11 (ref 0.00–1.49)
Prothrombin Time: 14.2 seconds (ref 11.6–15.2)

## 2010-06-03 LAB — APTT: aPTT: 36 seconds (ref 24–37)

## 2010-06-11 ENCOUNTER — Encounter (HOSPITAL_BASED_OUTPATIENT_CLINIC_OR_DEPARTMENT_OTHER): Payer: Medicare Other | Admitting: Oncology

## 2010-06-11 DIAGNOSIS — E538 Deficiency of other specified B group vitamins: Secondary | ICD-10-CM

## 2010-06-14 LAB — POCT HEMOGLOBIN-HEMACUE: Hemoglobin: 9.6 g/dL — ABNORMAL LOW (ref 12.0–15.0)

## 2010-07-12 ENCOUNTER — Other Ambulatory Visit (HOSPITAL_COMMUNITY): Payer: Self-pay | Admitting: Oncology

## 2010-07-12 ENCOUNTER — Ambulatory Visit (HOSPITAL_BASED_OUTPATIENT_CLINIC_OR_DEPARTMENT_OTHER): Payer: Medicare Other | Admitting: Oncology

## 2010-07-12 ENCOUNTER — Ambulatory Visit (HOSPITAL_COMMUNITY)
Admission: RE | Admit: 2010-07-12 | Discharge: 2010-07-12 | Disposition: A | Discharge status: Home / Self Care | Payer: Medicare Other | Source: Ambulatory Visit | Attending: Oncology | Admitting: Oncology

## 2010-07-12 DIAGNOSIS — C50919 Malignant neoplasm of unspecified site of unspecified female breast: Secondary | ICD-10-CM | POA: Insufficient documentation

## 2010-07-12 DIAGNOSIS — E538 Deficiency of other specified B group vitamins: Secondary | ICD-10-CM

## 2010-07-12 DIAGNOSIS — I10 Essential (primary) hypertension: Secondary | ICD-10-CM | POA: Insufficient documentation

## 2010-07-12 DIAGNOSIS — Z87891 Personal history of nicotine dependence: Secondary | ICD-10-CM | POA: Insufficient documentation

## 2010-07-12 LAB — COMPREHENSIVE METABOLIC PANEL
ALT: 16 U/L (ref 0–35)
AST: 19 U/L (ref 0–37)
Albumin: 4.1 g/dL (ref 3.5–5.2)
Alkaline Phosphatase: 75 U/L (ref 39–117)
BUN: 24 mg/dL — ABNORMAL HIGH (ref 6–23)
CO2: 29 mEq/L (ref 19–32)
Calcium: 8.9 mg/dL (ref 8.4–10.5)
Chloride: 97 mEq/L (ref 96–112)
Creatinine, Ser: 1.62 mg/dL — ABNORMAL HIGH (ref 0.40–1.20)
Glucose, Bld: 231 mg/dL — ABNORMAL HIGH (ref 70–99)
Potassium: 3.8 mEq/L (ref 3.5–5.3)
Sodium: 136 mEq/L (ref 135–145)
Total Bilirubin: 0.7 mg/dL (ref 0.3–1.2)
Total Protein: 6.9 g/dL (ref 6.0–8.3)

## 2010-07-12 LAB — CBC WITH DIFFERENTIAL/PLATELET
BASO%: 0.4 % (ref 0.0–2.0)
Basophils Absolute: 0 10*3/uL (ref 0.0–0.1)
EOS%: 2.7 % (ref 0.0–7.0)
Eosinophils Absolute: 0.2 10*3/uL (ref 0.0–0.5)
HCT: 37.2 % (ref 34.8–46.6)
HGB: 12.8 g/dL (ref 11.6–15.9)
LYMPH%: 16.6 % (ref 14.0–49.7)
MCH: 31.3 pg (ref 25.1–34.0)
MCHC: 34.4 g/dL (ref 31.5–36.0)
MCV: 90.9 fL (ref 79.5–101.0)
MONO#: 0.7 10*3/uL (ref 0.1–0.9)
MONO%: 11.2 % (ref 0.0–14.0)
NEUT#: 4.1 10*3/uL (ref 1.5–6.5)
NEUT%: 69.1 % (ref 38.4–76.8)
Platelets: 219 10*3/uL (ref 145–400)
RBC: 4.09 10*6/uL (ref 3.70–5.45)
RDW: 12.9 % (ref 11.2–14.5)
WBC: 6 10*3/uL (ref 3.9–10.3)
lymph#: 1 10*3/uL (ref 0.9–3.3)

## 2010-07-12 LAB — LACTATE DEHYDROGENASE: LDH: 189 U/L (ref 94–250)

## 2010-07-12 LAB — VITAMIN B12: Vitamin B-12: 1301 pg/mL — ABNORMAL HIGH (ref 211–911)

## 2010-07-23 NOTE — Consult Note (Signed)
NAME:  Diana [name], Diana [name]          ACCOUNT NO.:  [id number]   MEDICAL RECORD NO.:  [id number]          PATIENT TYPE:  INP   LOCATION:  5525                         FACILITY:  [facility]   PHYSICIAN:  Maree Krabbe, M.D.DATE OF BIRTH:  1933/05/31   DATE OF CONSULTATION:  DATE OF DISCHARGE:                                 CONSULTATION   REASON FOR CONSULTATION:  Hyponatremia   HISTORY:  This is a 77 year old white female with history of breast  cancer and bilateral mastectomy in 2001, diabetes mellitus type 2,  hypertension, hyperlipidemia, and hypothyroidism.  She was in her usual  state of health, but until the last several days prior to admission, she  had a profuse watery diarrhea, too numerous to count with some vomiting  and progressive generalized weakness.  She presented on February 26, was  admitted to the hospital with a serum sodium of 114.  Her creatinine was  1.5 at the time.  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  She  received normal saline at 100-125 cc/hour, with no real change in her  serum sodium concentration.  Today's level was 115.  The patient has  been fully alert and oriented.  Her diarrhea has improved here in the  hospital.   PAST MEDICAL HISTORY:  As above.   MEDICATIONS ON ADMISSION:  Included:  Glucophage, glipizide, Actos,  verapamil 180 daily, Benicar, HCTZ, Lipitor, fluoxetine, Synthroid,  Fosamax, Combigan eye drops,  p.r.n. Lasix and potassium.   ALLERGIES:  CODEINE.   SOCIAL HISTORY:  No alcohol or tobacco.  She lives with her husband who  is currently hospitalized.   FAMILY HISTORY:  Noncontributory.   REVIEW OF SYSTEMS:  She had been having some falls at home.  She has  some bruises on her leg, somewhat of a vague historian.  Denies any  shortness of breath, cough, productive cough, abdominal pain, difficulty  voiding, or ankle swelling.   PHYSICAL EXAM:  VITAL  SIGNS:  Blood pressure 130/80, heart rate 80,  respirations 618, temperature 96.7.  HEENT:  Unremarkable.  NECK:  Supple without JVD.  Neck veins are flat.  CHEST:  Clear throughout bilaterally.  CARDIAC:  Regular rate and rhythm without murmur, rub, or gallop.  She  has scars from prior mastectomy bilaterally.  ABDOMEN:  Obese, nontender, no organomegaly or ascites.  EXTREMITIES:  Trace ankle edema, otherwise no edema.  Moves all  extremities equally.  She has some ecchymosis on the right calf and  shin.  NEUROLOGIC:  She moves all extremities equally, no focal deficits.   LABS:  Sodium 115, potassium 4.4, CO2 24, BUN 15, creatinine 1.3, urine  sodium 48, urine osmolality elevated at 644,000,000 per kilogram,  cortisol level 22.  TSH 5.3, CT of the head showed chronic ischemic  changes.  No acute abnormality.  Chest x-ray on the 26th showed no  active disease.   IMPRESSION:  Severe hyponatremia which appears to be chronic and not  significantly symptomatic.  Suspect SIADH related to medications,  particularly SSRI and/or a thiazide.  Agree with holding these  medications and also holding her angiotensin receptor blocker.  No  response to normal saline might be predicted, given the fact that she  has relatively hypertonic urine with elevated urine osmolality of 644.  She will need hypertonic saline, in order to improve her serum sodium.  Her sodium deficits calculated to be 240 mEq needed to raise the serum  sodium by 8 mEq over 24 hours.  We will administer this IV, as 480 mL of  3% sodium over 24 hours, check labs twice a day, will follow.      Maree Krabbe, M.D.  Electronically Signed     RDS/MEDQ  D:  05/09/2007  T:  05/09/2007  Job:  [id number]

## 2010-07-23 NOTE — Discharge Summary (Signed)
Diana [name], Diana [name]          ACCOUNT NO.:  [id number]   MEDICAL RECORD NO.:  [id number]          PATIENT TYPE:  INP   LOCATION:  5525                         FACILITY:  [facility]   PHYSICIAN:  Elliot Cousin, M.D.    DATE OF BIRTH:  1933/04/05   DATE OF ADMISSION:  05/06/2007  DATE OF DISCHARGE:  05/11/2007                               DISCHARGE SUMMARY   DISCHARGE DIAGNOSES:  1. Altered mental status, thought to be secondary to hyponatremia.  2. Hyponatremia with an admission serum sodium of 114.  Etiology      probably secondary to syndrome of inappropriate antidiuretic      hormone; fluoxetine, and hydrochlorothiazide..  3. Acute renal insufficiency.  4. Borderline hyperkalemia.  5. Hypertension.  6. Diabetes mellitus type 2.  7. Hypothyroidism.  8. Depression.  9. Prior history of nausea, vomiting and diarrhea.   DISCHARGE MEDICATIONS:  1. Do not take Benicar, HCTZ, fluoxetine, Lasix, glipizide ER, and      potassium.  2. Metformin 1000 mg b.i.d.  3. Actos 45 mg daily.  4. Levothyroxine 0.5 mg daily.  5. Verapamil ER 180 mg daily.  6. Lipitor 10 mg daily.  7. Promethazine 25 mg every 4 hours as needed for nausea.  8. ComBgen  1 drop in appropriate eyes b.i.d.  9. Flomax 70 mg daily.  10.MiraLax laxative take as directed and as needed   DISCHARGE DISPOSITION:  The patient is being discharged home in improved  and stable condition on May 11, 2007.  She was advised to follow up  with Dr. Artis Flock for hospital follow-up and blood work in 2-3 days..   CONSULTATIONS:  Nephrologists,  Maree Krabbe, M.D. and Cecille Aver, M.D.   PROCEDURE PERFORMED:  1. CT scan of the head on May 06, 2007.  The results revealed      chronic ischemic changes with no acute abnormalities.  2. Chest x-ray on May 06, 2007.  The results revealed cardiac      enlargement with no active cardiopulmonary disease.   HISTORY OF PRESENT ILLNESS:  The patient is a 77 year old  woman with a  past medical history significant for type 2 diabetes mellitus, breast  cancer, hypothyroidism, and depression.  She presented to the emergency  department on May 06, 2007 with a chief complaint of nausea,  vomiting and diarrhea.  The patient was also noted to have some  confusion.  When she was evaluated in the emergency department, her  serum sodium was found to be low at 114.  The patient was, therefore,  admitted for further evaluation and management.   For additional details, please see the dictated history and physical.   HOSPITAL COURSE:  1. Hyponatremia.  The patient was started on normal saline for volume      repletion, given her recent history of nausea, vomiting and      diarrhea.  The patient was totally afebrile and had a normal white      blood cell count and, therefore, no antibiotics or antiparasitic      medications were given.  During the first 24 hours of the  hospitalization and throughout the hospital course, the patient had      no evidence of nausea, vomiting or diarrhea.  Her diet was advanced      from clear liquids to a diabetic diet and the patient tolerated the      advancement well.  Her serum sodium was followed closely.  For      further evaluation, urine osmolality, urine sodium, blood cortisol,      uric acid level, and a TSH were ordered.  The urine osmolality was      644, the urine sodium was 48, blood cortisol was 22.5, uric acid      was 4.9, and TSH was 5.3.  The lab indices appeared to be more      consistent with syndrome of inappropriate antidiuretic hormone      secretion.  Therefore, following 2 days of treatment with normal      saline, the IV fluids were discontinued.  However, the patient's      serum sodium only improved to 115.  Of note, the      hydrochlorothiazide, Benicar, and fluoxetine were discontinued on      hospital day #2.  For further evaluation and recommendations,      nephrologist Dr. Arlean Hopping was  consulted.  He agreed with the      management up to the point of his assessment.  He started the      patient on hypertonic saline (3%) for 2 days.  The hypertonic      saline proved to be effective  as the patient's serum sodium      improved to 123.  The patient is currently alert and oriented with      no signs of delirium, lethargy or altered mental status.  She has      been ambulating in the hallway with the nursing staff.  The patient      will be discharged home with instructions to discontinue the      fluoxetine, Benicar, HCTZ, and Lasix.  The patient was strongly      advised to follow up with Dr. Artis Flock  in 2-3 days.  2. Mild hyperkalemia.  As the patient's serum sodium improved, so did      her serum potassium.  Over the past few days, her serum potassium      has increased slightly from 4.8 to 5.2.  She will be given      Kayexalate today.  As indicated above, she was advised to follow up      with Dr. Artis Flock in 2-3 days to have not only her serum sodium      rechecked but also her serum potassium.  The patient was advised to      discontinue the Benicar and potassium supplement prior to hospital      discharge.  She voiced understanding.  3. Hypertension.  The patient was maintained on verapamil during the      hospital course.  As indicated above, the Benicar and HCTZ were      discontinued.  Her blood pressures remained stable during the      hospital course.  4. Type 2 diabetes mellitus.  The patient's capillary blood glucose      was well controlled on sliding scale NovoLog, Lantus, and Actos.      The Glucophage and glipizide were withheld during the hospital      course because of acute renal insufficiency and the potential for  hypoglycemia.  Prior to hospital discharge, the patient was advised      to restart metformin and Actos at its previous dose but to not take      the glipizide unless her capillary blood glucose was consistently      above 150.  Her  hemoglobin A1C was 5.3 during the hospital course.  5. Acute renal insufficiency.  At the time of the initial hospital      assessment, the patients BUN was 14 and her creatinine was 1.5.      She was repleted with normal saline as stated above.  As of today      her BUN is 12 and her creatinine is 1.27.  6. Hypothyroidism.  The patient's TSH was within normal limits as      stated above.  She was maintained on Synthroid during hospital      course.   Additional laboratory results during the hospital course include folic  acid 14.1; RPR nonreactive, total cholesterol 163, HDL cholesterol 49  and LDL cholesterol 97.  Urine culture negative.  Vitamin B12 greater  than 2000.      Elliot Cousin, M.D.  Electronically Signed     DF/MEDQ  D:  05/11/2007  T:  05/11/2007  Job:  [id number]   cc:   Quita Skye. Artis Flock, M.D.

## 2010-07-23 NOTE — H&P (Signed)
NAME:  Diana [name], Diana [name]          ACCOUNT NO.:  [id number]   MEDICAL RECORD NO.:  [id number]          PATIENT TYPE:  EMS   LOCATION:  MAJO                         FACILITY:  [facility]   PHYSICIAN:  Hettie Holstein, D.O.    DATE OF BIRTH:  [birth year]   DATE OF ADMISSION:  05/06/2007  DATE OF DISCHARGE:                              HISTORY & PHYSICAL   PRIMARY CARE PHYSICIAN:  Rodolph Bong, M.D.   CHIEF COMPLAINT:  Quite difficult to discern, as the patient is a very  poor historian; however, it is reported by m-STAT  reports that the  patient had diarrhea x8 days.  She went to her primary care physician  and got some Phenergan thought has not taken it.  She had some emesis  upon arrival to the emergency department, though she cannot recall.  In  any event, she does have diabetes.  Her primary caregiver had been her  husband who has been hospitalized due to pneumonia, and Diana [name]  seems out of sorts.  It is unclear whether this is related to her  profound hyponatremia.  Will need to repeat this lab to assure that it  is correct.  In any event, she does have some emesis on her gown.  She  does appear quite slow to respond and nauseated.  She is belching upon  questioning, and history is limited, as she does appear to be a bit  restless.   She is also in the hallway examination area, and currently all ER beds  are full, and she declined to provide a urine sample due to being in the  hallway.  This can be performed when she arrives upstairs.   PAST MEDICAL HISTORY:  1. History of breast cancer status post right mastectomy status post      chemotherapy of the right breast.  I believe this was in 2001.  2. History of diabetes mellitus.  3. Hypertension.  4. Hypothyroidism.  5. Apparently glaucoma based on her medications.  6. Hyperlipidemia.  (Please note that all history is obtained from      what can be gleaned from records and what has been able to be      elicited from Ms.  [name] at the time).   MEDICATIONS:  I contacted Karin Golden on [employer].  Medications were confirmed as follows:  1. Glucophage 1 gram b.i.d.  2. Glipizide ER 10 mg daily.  3. Actos 45 mg daily.  4. Verapamil ER 180 mg daily.  5. Benicar/HCT 20/12.5 daily.  6. Lipitor 10 mg daily.  7. Fluoxetine 20 mg daily.  8. Synthroid 0.05 mg daily.  9. Fosamax 70 mg weekly.  10.Combigan 1 drop b.i.d.  11.Potassium.  12.Lasix on a p.r.n. basis.  It is not clear whether or not Mrs.      [name] is taking this.   ALLERGIES:  CODEINE.   SOCIAL HISTORY:  She denies alcohol.  She states she smoked in the  distant past.  She typically lives with husband, but husband is  currently hospitalized.   FAMILY HISTORY:  Family history cannot be obtained.   REVIEW OF  SYSTEMS:  Review of systems was unreliable, though it was  gleaned that she had been having some falls at home, though she denied  any head trauma.  Her review of systems is very inconsistent, as  sometimes she states that she is having nausea and vomiting and other  times she is not.   PHYSICAL EXAMINATION:  VITAL SIGNS:  Blood pressure 134/88, respirations  18, heart rate 81, respirations 99%, temperature 96.7.  HEENT:  Head is normocephalic, atraumatic.  Extraocular muscles are  intact.  NECK:  Supple, nontender.  No palpable thyromegaly or mass.  GENERAL:  She does appear disoriented, but upon questioning, she is able  to answer some simple questions.  She is oriented.  CARDIOVASCULAR:  Normal S1 and S2.  LUNGS:  Clear bilaterally.  ABDOMEN:  Soft and nontender.  EXTREMITIES:  She does have some bruising about her right toe, with some  brown to purple bruising about her knees and her thigh on the left.  These appear different ages.   LABORATORY DATA:  Sodium 114, potassium 4.4, BUN 14, creatinine 1.5,  glucose 170.  HDL 31, ALT 22, albumin 3.6.  WBC is 6.9, hemoglobin 12.9,  platelet count 274, MCV is 91.    Chest x-ray revealed cardiac enlargement.  There is no active disease.   ASSESSMENT:  1. Alteration of mental status.  Uncertain of Diana [name]'s      baseline.  2. Hyponatremia.  This is I-STAT labs performed in the emergency      department.  Will need a repeat to confirm to assure that this is      accurate.  This certainly could be in the setting of volume      depletion.  Continue diuretics and ACE inhibitor and rehydration.      Will pursue volume replacement and followup.  Basic metabolic panel      this evening to move towards normalization of her sodium and assure      that we are not correcting her too rapidly.  3. Diabetes mellitus  4. Morbid obesity.  5. History of breast cancer.  6. Hyperlipidemia.  7. Falls at home.   PLAN:  At this time, we are ordering a CT to rule out head trauma or  bleed.  Will administer generous IV fluids and follow up basic metabolic  panel this evening to assure appropriate correction of her hyponatremia.  Will check a urine osmolarity and a TSH and administer DVT prophylactic  measures.  This is the end of dictation.  Please cc a copy of to Dr.  Penni Bombard.      Hettie Holstein, D.O.  Electronically Signed     ESS/MEDQ  D:  05/06/2007  T:  05/07/2007  Job:  [id number]   cc:   Rodolph Bong, M.D.

## 2010-07-26 NOTE — Procedures (Signed)
[facility]  Patient:    Diana [name], Diana [name]                 MRN: [id number] Proc. Date: 10/22/00 Adm. Date:  [id number] Attending:  Mervin Hack CC:         Quita Skye. Artis Flock, M.D.   Procedure Report  PROCEDURE:  Colonoscopy.  INDICATIONS:  This 77 year old white female has a history of colon polyps found about 10 years ago in a flexible sigmoidoscopy by Dr. Artis Flock. She also has a family history of colon polyps in her brother and father. She had a previous colon resection in the past for benign disease. The details of this are not known. She is now undergoing a colonoscopy for followup of colon polyps.  ENDOSCOPE:  Olympus single-channel videoscope.  SEDATION:  Versed 8 mg IV, Demerol 80 mg IV.  FINDINGS:  Olympus single-channel videoscope was passed routinely through rectum to sigmoid colon. The patient was monitored by pulse oximetry. Oxygen saturations were satisfactory. Her prep was good.  Rectal tone was decreased. There was a polyp in the rectal ampulla which measured about 5 to 6 mm and it was removed with snare. At the level of 25 cm was a semisessile 1 cm polyp with several lobes which was removed with snare a and sent to pathology in the same specimen bottle. Between 50 and 70 cm from the rectum were two polyps which were ablated with whole biopsies. Both of them were sent to pathology in specimen bottle #2 together with another polyp at 90 cm. Scattered diverticula were noted in the right colon. Two more polyps were found in the right colon, both ablated with whole biopsies and sent to pathology in specimen bottle #3. Colonoscope was then retracted and the colon decompressed. Patient tolerated the procedure well.  IMPRESSION: 1. Multiple colon polyps, transverse and right colon, status post    multiple snare and cautery polypectomies. 2. Universal diverticulosis of the colon.  PLAN: 1. Post-polypectomy instructions which  will include avoidance of aspirin. 2. Await results of the colon pathologies. 3. Patient will need repeat colonoscopy in two years because of the multiple    polyps found. DD:  10/22/00 TD:  10/22/00 Job: [id number]

## 2010-07-26 NOTE — Op Note (Signed)
[facility]  Patient:    Diana [name], Diana [name]                 MRN: [id number] Proc. Date: 06/04/99 Adm. Date:  [id number] Disc. Date: [id number] Attending:  Carson Myrtle                           Operative Report  PREOPERATIVE DIAGNOSES: 1. Bilateral breast cancer. 2. Need for chemotherapy.  POSTOPERATIVE DIAGNOSES: 1. Bilateral breast cancer. 2. Need for chemotherapy.  PROCEDURE:  Insertion of Port-A-Cath.  SURGEON:  Timothy E. Earlene Plater, M.D.  ANESTHESIA:  Local standby.  BRIEF HISTORY:  Diana [name] is well known to me.  She is a 77 year old Caucasian female who upon her first mammogram was found to have bilateral abnormalities.  Core biopsies were done revealing 3 separate synchronous carcinomas of the left  breast, 1 carcinoma of the right breast.  After careful consideration she underwent bilateral mastectomies and bilateral sentinel node biopsies.  She is now prepared to undergo chemotherapy by Dr. Dolan Amen.  They require Port-A-Cath for hat chemotherapy.  The patient agrees and understands.  PROCEDURE:  The patient was brought to room 14, placed supine.  IV started, sedation given.  Arms tucked.  Head and shoulders carefully positioned.  The upper chest and neck were prepped and draped in the usual fashion.  Approach to the right subclavian area after anesthetization of the skin with 0.5% Marcaine.  I was able to locate and puncture the right subclavian vein with the  introducer needle.  A wire was introduced and ascertained to be in good position with fluoroscopy. The puncture needle was removed. The puncture site was enlarged and the #11 introducer was placed over the wire, wire removed, and the introducer in place by fluoroscopy.  The catheter portion of the Port-A-Cath was then irrigated and inserted through the introducer which was then removed.  The catheter was positioned by fluoroscopy to be in good  position.  Then in a separate side in the right upper chest, under local anesthesia, a pocket was prepared for the port of the Port-A-Cath.  This was done without difficulty. Catheter was placed subcutaneously from the puncture site to the port site. All equipment irrigated and then connected and fastened.  It was in good position. It flowed well.  It was then attached to the prepectoral fascia with 3 sutures of interrupted 2-0 Prolene.  Again, it was irrigated and flowed well and seemed to be in good position.  The wound was then closed with Vicryl suture, Steri-strips and OpSite.  Concentrated heparin was then injected percutaneously and again the flow was good.  She tolerated it well.  There were no complications.  The counts were correct and she was removed to recovery room in good condition.  FOLLOWUP:  Follow up will be as an outpatient. DD:  06/04/99 TD:  06/05/99 Job: [id number]

## 2010-08-13 ENCOUNTER — Encounter: Payer: Medicare Other | Attending: Family Medicine | Admitting: Dietician

## 2010-09-30 ENCOUNTER — Encounter: Payer: Self-pay | Admitting: Dietician

## 2010-09-30 ENCOUNTER — Encounter: Payer: Medicare Other | Attending: Family Medicine | Admitting: Dietician

## 2010-09-30 NOTE — Progress Notes (Signed)
  Medical Nutrition Therapy:  Appt start time: 1730 end time:  1800.  Assessment:  Primary concerns today: Blood glucose control and weight loss.   MEDICATIONS: Continues with previous medications.  Currently at 11 units of Lantus insulin, instead of 10 units.  Other medications that computer will not allow for documentation and I have no set dosage include:Potassium and Magnesium (taken on alternate days), Glucosemine, B12 injection monthly, Citracal, and Fish oil,   DIETARY INTAKE:  24-hr recall:  B (10:00-12:00 noon): maybe a cereal or maybe a sandwich.  Snk ( AM) :Generally, reads late and gets up late and has late breakfast  L (3:00 PM):  Eats a variety of items, May have salsa with chips, celery and pimento cheese or a hot dog with onions slaw and chili  Snk (3-4:00 PM): Depends on if she had a lunch meal.  Often skips lunch and has a large afternoon snack. D (7:00-8:00 PM): Times and foods vary.  This may be a large snack or it maybe a full meal. Often will have a shrimp salad with a starchy vegetable. Snk (10-11:00 PM): Some nights has popcorn.  Every night has 5 small dove bars as she reads late into the early morning.  Calories=200.  Recent physical activity: Activity continues to be limited.  Continues to use her cane and experience joint pain in the knees.  Estimated energy needs:We arrived at the fact that she needs to keep calories at 1200 /day.  She has kept a daily diary and counted calories.  She varies from [phone]. Since August 14, 2010, she has lost 7.1 lbs.  I feel that this is due to the fact that she is closely monitoring her diet. She prefers to count calories rather than carbohydrates.   Progress Towards Goal(s):  Some progress. Has demonstrated lower fasting levels over the last 3 weeks.   Nutritional Diagnosis: Impaired nutrient process, glucose with increased fasting blood glucose as evidenced by fasting levels at 145, 183, 141.     Intervention:  Nutrition  Plan to continue to keep the daily calorie diary.  Try to keep intake at approximately 1200 calories per day.  Monitoring/Evaluation:  Dietary intake, exercise, blood glucose levels, and body weight prn.

## 2010-10-01 NOTE — Patient Instructions (Signed)
-  Maintain a daily diary with calorie count, blood glucose levels and at least a weekly weight. -Try to keep calorie intake at approximately 1100-1200 calories per day. -Please let me know the results of your HgA1c on Wednesday.  Leave a message. -Try to incorporate more daily activity into your scheduls.

## 2010-12-02 LAB — COMPREHENSIVE METABOLIC PANEL
ALT: 22
ALT: 27
AST: 31
AST: 32
Albumin: 3.6
Albumin: 2.8 — ABNORMAL LOW
Alkaline Phosphatase: 44
Alkaline Phosphatase: 36 — ABNORMAL LOW
BUN: 14
BUN: 13
CO2: 24
CO2: 24
Calcium: 8.6
Calcium: 7.3 — ABNORMAL LOW
Chloride: 79 — CL
Chloride: 85 — ABNORMAL LOW
Creatinine, Ser: 1.5 — ABNORMAL HIGH
Creatinine, Ser: 1.34 — ABNORMAL HIGH
GFR calc Af Amer: 41 — ABNORMAL LOW
GFR calc Af Amer: 47 — ABNORMAL LOW
GFR calc non Af Amer: 34 — ABNORMAL LOW
GFR calc non Af Amer: 39 — ABNORMAL LOW
Glucose, Bld: 170 — ABNORMAL HIGH
Glucose, Bld: 117 — ABNORMAL HIGH
Potassium: 4.4
Potassium: 4.5
Sodium: 114 — CL
Sodium: 116 — CL
Total Bilirubin: 1
Total Bilirubin: 1.1
Total Protein: 6.9
Total Protein: 5.4 — ABNORMAL LOW

## 2010-12-02 LAB — URIC ACID: Uric Acid, Serum: 4.9

## 2010-12-02 LAB — BASIC METABOLIC PANEL
BUN: 15
BUN: 16
BUN: 19
BUN: 12
BUN: 12
BUN: 12
BUN: 13
CO2: 23
CO2: 23
CO2: 24
CO2: 20
CO2: 23
CO2: 25
CO2: 25
Calcium: 7.3 — ABNORMAL LOW
Calcium: 7.8 — ABNORMAL LOW
Calcium: 7.9 — ABNORMAL LOW
Calcium: 7.2 — ABNORMAL LOW
Calcium: 7.2 — ABNORMAL LOW
Calcium: 7.3 — ABNORMAL LOW
Calcium: 7.7 — ABNORMAL LOW
Chloride: 79 — CL
Chloride: 81 — ABNORMAL LOW
Chloride: 83 — ABNORMAL LOW
Chloride: 85 — ABNORMAL LOW
Chloride: 89 — ABNORMAL LOW
Chloride: 92 — ABNORMAL LOW
Chloride: 93 — ABNORMAL LOW
Creatinine, Ser: 1.3 — ABNORMAL HIGH
Creatinine, Ser: 1.51 — ABNORMAL HIGH
Creatinine, Ser: 1.55 — ABNORMAL HIGH
Creatinine, Ser: 1.19
Creatinine, Ser: 1.19
Creatinine, Ser: 1.27 — ABNORMAL HIGH
Creatinine, Ser: 1.29 — ABNORMAL HIGH
GFR calc Af Amer: 40 — ABNORMAL LOW
GFR calc Af Amer: 41 — ABNORMAL LOW
GFR calc Af Amer: 49 — ABNORMAL LOW
GFR calc Af Amer: 49 — ABNORMAL LOW
GFR calc Af Amer: 50 — ABNORMAL LOW
GFR calc Af Amer: 54 — ABNORMAL LOW
GFR calc Af Amer: 54 — ABNORMAL LOW
GFR calc non Af Amer: 33 — ABNORMAL LOW
GFR calc non Af Amer: 34 — ABNORMAL LOW
GFR calc non Af Amer: 40 — ABNORMAL LOW
GFR calc non Af Amer: 41 — ABNORMAL LOW
GFR calc non Af Amer: 41 — ABNORMAL LOW
GFR calc non Af Amer: 45 — ABNORMAL LOW
GFR calc non Af Amer: 45 — ABNORMAL LOW
Glucose, Bld: 128 — ABNORMAL HIGH
Glucose, Bld: 172 — ABNORMAL HIGH
Glucose, Bld: 234 — ABNORMAL HIGH
Glucose, Bld: 105 — ABNORMAL HIGH
Glucose, Bld: 111 — ABNORMAL HIGH
Glucose, Bld: 130 — ABNORMAL HIGH
Glucose, Bld: 173 — ABNORMAL HIGH
Potassium: 3.8
Potassium: 4.3
Potassium: 4.4
Potassium: 4.8
Potassium: 5.1
Potassium: 5.2 — ABNORMAL HIGH
Potassium: 5.3 — ABNORMAL HIGH
Sodium: 113 — CL
Sodium: 114 — CL
Sodium: 115 — CL
Sodium: 115 — CL
Sodium: 116 — CL
Sodium: 122 — ABNORMAL LOW
Sodium: 123 — ABNORMAL LOW

## 2010-12-02 LAB — DIFFERENTIAL
Basophils Absolute: 0
Basophils Relative: 0
Eosinophils Absolute: 0
Eosinophils Relative: 0
Lymphocytes Relative: 12
Lymphs Abs: 0.8
Monocytes Absolute: 1.1 — ABNORMAL HIGH
Monocytes Relative: 16 — ABNORMAL HIGH
Neutro Abs: 5
Neutrophils Relative %: 72

## 2010-12-02 LAB — URINALYSIS, ROUTINE W REFLEX MICROSCOPIC
Bilirubin Urine: NEGATIVE
Glucose, UA: 100 — AB
Ketones, ur: 15 — AB
Nitrite: NEGATIVE
Protein, ur: 100 — AB
Specific Gravity, Urine: 1.025
Urobilinogen, UA: 0.2
pH: 5.5

## 2010-12-02 LAB — CBC
HCT: 32.8 — ABNORMAL LOW
HCT: 38.1
HCT: 33 — ABNORMAL LOW
Hemoglobin: 11.2 — ABNORMAL LOW
Hemoglobin: 12.9
Hemoglobin: 11.3 — ABNORMAL LOW
MCHC: 33.8
MCHC: 34.2
MCHC: 34.4
MCV: 89.6
MCV: 91.1
MCV: 90.4
Platelets: 252
Platelets: 274
Platelets: 292
RBC: 3.66 — ABNORMAL LOW
RBC: 4.18
RBC: 3.65 — ABNORMAL LOW
RDW: 14
RDW: 14.1
RDW: 14.3
WBC: 6.7
WBC: 6.9
WBC: 3.4 — ABNORMAL LOW

## 2010-12-02 LAB — LIPID PANEL
Cholesterol: 163
HDL: 49
LDL Cholesterol: 97
Total CHOL/HDL Ratio: 3.3
Triglycerides: 86
VLDL: 17

## 2010-12-02 LAB — URINE CULTURE
Colony Count: NO GROWTH
Culture: NO GROWTH

## 2010-12-02 LAB — FOLATE: Folate: 14.1

## 2010-12-02 LAB — URINE MICROSCOPIC-ADD ON

## 2010-12-02 LAB — MAGNESIUM: Magnesium: 1.7

## 2010-12-02 LAB — VITAMIN B12: Vitamin B-12: >2000 — ABNORMAL HIGH (ref 211–911)

## 2010-12-02 LAB — RPR: RPR Ser Ql: NONREACTIVE

## 2010-12-02 LAB — TSH: TSH: 5.303

## 2010-12-02 LAB — CORTISOL: Cortisol, Plasma: 22.5

## 2010-12-02 LAB — LIPASE, BLOOD: Lipase: 30

## 2010-12-02 LAB — HEMOGLOBIN A1C
Hgb A1c MFr Bld: 5.3
Mean Plasma Glucose: 111

## 2010-12-02 LAB — SODIUM, URINE, RANDOM: Sodium, Ur: 48

## 2010-12-02 LAB — OSMOLALITY, URINE: Osmolality, Ur: 644

## 2011-01-02 ENCOUNTER — Encounter: Payer: Self-pay | Admitting: Oncology

## 2011-01-24 ENCOUNTER — Ambulatory Visit: Payer: Medicare Other | Admitting: Oncology

## 2011-01-24 ENCOUNTER — Other Ambulatory Visit: Payer: Medicare Other | Admitting: Lab

## 2011-03-18 ENCOUNTER — Ambulatory Visit (HOSPITAL_BASED_OUTPATIENT_CLINIC_OR_DEPARTMENT_OTHER): Payer: Medicare Other | Admitting: Oncology

## 2011-03-18 ENCOUNTER — Other Ambulatory Visit (HOSPITAL_COMMUNITY): Payer: Self-pay | Admitting: Oncology

## 2011-03-18 ENCOUNTER — Other Ambulatory Visit: Payer: Medicare Other | Admitting: Lab

## 2011-03-18 ENCOUNTER — Encounter: Payer: Self-pay | Admitting: Oncology

## 2011-03-18 VITALS — BP 126/68 | HR 64 | Temp 97.0°F | Ht 68.0 in | Wt 244.7 lb

## 2011-03-18 DIAGNOSIS — C50919 Malignant neoplasm of unspecified site of unspecified female breast: Secondary | ICD-10-CM | POA: Insufficient documentation

## 2011-03-18 DIAGNOSIS — E538 Deficiency of other specified B group vitamins: Secondary | ICD-10-CM

## 2011-03-18 LAB — COMPREHENSIVE METABOLIC PANEL
ALT: 14 U/L (ref 0–35)
AST: 17 U/L (ref 0–37)
Albumin: 3.7 g/dL (ref 3.5–5.2)
Alkaline Phosphatase: 73 U/L (ref 39–117)
BUN: 27 mg/dL — ABNORMAL HIGH (ref 6–23)
CO2: 26 mEq/L (ref 19–32)
Calcium: 8.4 mg/dL (ref 8.4–10.5)
Chloride: 102 mEq/L (ref 96–112)
Creatinine, Ser: 1.7 mg/dL — ABNORMAL HIGH (ref 0.50–1.10)
Glucose, Bld: 281 mg/dL — ABNORMAL HIGH (ref 70–99)
Potassium: 3.7 mEq/L (ref 3.5–5.3)
Sodium: 139 mEq/L (ref 135–145)
Total Bilirubin: 0.7 mg/dL (ref 0.3–1.2)
Total Protein: 6.3 g/dL (ref 6.0–8.3)

## 2011-03-18 LAB — CBC WITH DIFFERENTIAL/PLATELET
BASO%: 0.3 % (ref 0.0–2.0)
Basophils Absolute: 0 10*3/uL (ref 0.0–0.1)
EOS%: 1.6 % (ref 0.0–7.0)
Eosinophils Absolute: 0.1 10*3/uL (ref 0.0–0.5)
HCT: 35.8 % (ref 34.8–46.6)
HGB: 12.1 g/dL (ref 11.6–15.9)
LYMPH%: 8.3 % — ABNORMAL LOW (ref 14.0–49.7)
MCH: 30.9 pg (ref 25.1–34.0)
MCHC: 33.8 g/dL (ref 31.5–36.0)
MCV: 91.3 fL (ref 79.5–101.0)
MONO#: 0.7 10*3/uL (ref 0.1–0.9)
MONO%: 7.3 % (ref 0.0–14.0)
NEUT#: 7.4 10*3/uL — ABNORMAL HIGH (ref 1.5–6.5)
NEUT%: 82.5 % — ABNORMAL HIGH (ref 38.4–76.8)
Platelets: 195 10*3/uL (ref 145–400)
RBC: 3.93 10*6/uL (ref 3.70–5.45)
RDW: 13.5 % (ref 11.2–14.5)
WBC: 9 10*3/uL (ref 3.9–10.3)
lymph#: 0.7 10*3/uL — ABNORMAL LOW (ref 0.9–3.3)

## 2011-03-18 LAB — LACTATE DEHYDROGENASE: LDH: 216 U/L (ref 94–250)

## 2011-03-18 LAB — VITAMIN B12: Vitamin B-12: >2000 pg/mL — ABNORMAL HIGH (ref 211–911)

## 2011-03-18 NOTE — Progress Notes (Signed)
CC:   Marjory Lies, M.D. Nicki Guadalajara, M.D. Cecille Aver, M.D.  HISTORY:  Diana [name] was seen today for followup of her bilateral multicentric multifocal breast cancers dating back to February 2001.  The patient is currently without evidence of disease, now 12 years from the time of diagnosis and bilateral simple mastectomies with bilateral sentinel lymph node biopsies all of which were negative.  Mrs. Back that tells me that her husband passed away approximately 2 years ago.  She is living alone, but seems quite self-sufficient.  She had a rough time of it in March and April of 2011.  She apparently suffered a vertebral fracture in March, required hospitalization and what sounds like kyphoplasty.  She needed to go to [facility] in [place].  There she apparently fell and required another hospitalization.  During that period of time she apparently gained a lot of fluid with her weight getting up to almost 300 pounds.  She apparently had some kidney and heart problems related to fluid overload, but those problems apparently got straightened out in the weeks and months subsequently and the patient seems to be doing quite well over the past year and a half.  She gets around with a cane.  She is really without any major complaints today, certainly none that suggest a recurrence of her breast cancer.  She denies any respiratory problems, any symptoms that sound like congestive heart failure such as orthopnea or PND.  She does have some swelling of her legs.  Her main issue is decreased energy.  PROBLEM LIST:  Fairly extensive and reads as follows. 1. Bilateral multicentric multifocal breast cancer status post     bilateral simple mastectomies and bilateral sentinel lymph node     biopsies all of which were negative.  This dates back to February     of 2001.  The patient had 3 discrete lesions in her left breast     with the largest being 1.4 cm.   She had 1 lesion in her right     breast measuring 1.1 cm.  One sentinel lymph node was removed from     the left axilla, 2 sentinel lymph nodes from the right axilla.     These were all negative.  Hormone receptors were positive, HER-     2/neu was negative.  The patient received 4 cycles of adjuvant     Adriamycin and Cytoxan under the direction of Dr. Nevada Crane.  She     was on Arimidex from September 2001 through September 2006.  She     was last seen by [place] on 07/12/2010.  She is without evidence of     disease now 12 years from the time of diagnosis. 2. Diabetes mellitus type 2. 3. Hypertension. 4. Renal insufficiency. 5. Osteopenia. 6. Colonic polyps. 7. Cardiomegaly with possible history of congestive heart failure. 8. Obesity. 9. Dyslipidemia. 10.History of low vitamin B12 level in March 2007. 11.Status post lumbar kyphoplasty on 05/30/2009.  MEDICATIONS:  The patient is on an extensive list of medicines which were reviewed and recorded.  She tells me that she is now on insulin.  PHYSICAL EXAM:  [name] is very pleasant and interactive.  She is now 78 years old.  Weight is 244 pounds, height 5 feet 8 inches, body surface area 2.31 m square.  Blood pressure 126/68.  Other vital signs are normal.  She is afebrile.  There is no scleral icterus.  Mouth and pharynx are benign.  There is no peripheral adenopathy palpable. Heart/lungs:  Normal.  The patient has had bilateral mastectomies. There is redundant skin, but no evidence for recurrent breast cancer. No axillary adenopathy.  Abdomen:  Massively obese, nontender with no organomegaly or masses palpable.  Extremities:  Trace to 1+ bilateral edema of the lower legs.  No obvious lymphedema of the arms.  LABORATORY DATA:  Today white count 9.0, ANC 7.4, hemoglobin 12.1, hematocrit 35.8, platelets 195,000.  Chemistries and vitamin B12 level are pending.  Chemistries from 07/12/2010 notable for a BUN of 24, creatinine  1.62, glucose 131.  Vitamin B12 level on 07/12/2010 was 1301. The patient had been on vitamin B12 shots at that point in time.  We recommended that she could stop the B12 shots given monthly and try vitamin B12 1000 mcg daily.  We are checking a vitamin B12 level today. We will try to remember to call the patient with the results.  The patient's last chest x-ray was on 07/12/2010.  She does need mammograms as she has had bilateral mastectomies.  IMPRESSION AND PLAN:  [name] continues to do well, now 12 years from the time of diagnosis and her bilateral mastectomies without evidence for recurrent disease.  She is on no medicine for her breast cancer.  We have told the patient that at this point she does not need ongoing follow up, although we would be happy to see her should any questions or concerns arise in the future.  The patient is quite happy with this.    ______________________________ Samul Dada, M.D. DSM/MEDQ  D:  03/18/2011  T:  03/18/2011  Job:  [id number]

## 2011-03-18 NOTE — Progress Notes (Signed)
This office note has been dictated.  #[id number]

## 2011-03-19 ENCOUNTER — Telehealth: Payer: Self-pay | Admitting: Medical Oncology

## 2011-03-19 NOTE — Telephone Encounter (Signed)
I called pt and left her a message regarding her Vit B12. Per Dr. Arline Asp she take it every other day instead of daily. Her level was greater than 2000.

## 2011-05-08 ENCOUNTER — Other Ambulatory Visit (HOSPITAL_COMMUNITY): Payer: Self-pay | Admitting: Family Medicine

## 2011-05-08 ENCOUNTER — Other Ambulatory Visit: Payer: Self-pay | Admitting: Family Medicine

## 2011-05-08 DIAGNOSIS — N6310 Unspecified lump in the right breast, unspecified quadrant: Secondary | ICD-10-CM

## 2011-05-08 DIAGNOSIS — R222 Localized swelling, mass and lump, trunk: Secondary | ICD-10-CM

## 2011-05-09 ENCOUNTER — Ambulatory Visit
Admission: RE | Admit: 2011-05-09 | Discharge: 2011-05-09 | Disposition: A | Discharge status: Home / Self Care | Payer: Medicare Other | Source: Ambulatory Visit | Attending: Family Medicine | Admitting: Family Medicine

## 2011-05-09 DIAGNOSIS — R222 Localized swelling, mass and lump, trunk: Secondary | ICD-10-CM

## 2012-01-22 ENCOUNTER — Encounter: Payer: Self-pay | Admitting: Internal Medicine

## 2012-01-22 ENCOUNTER — Other Ambulatory Visit: Payer: Self-pay | Admitting: Family Medicine

## 2012-01-22 DIAGNOSIS — M810 Age-related osteoporosis without current pathological fracture: Secondary | ICD-10-CM

## 2012-01-22 DIAGNOSIS — Z139 Encounter for screening, unspecified: Secondary | ICD-10-CM

## 2012-01-26 ENCOUNTER — Ambulatory Visit
Admission: RE | Admit: 2012-01-26 | Discharge: 2012-01-26 | Disposition: A | Discharge status: Home / Self Care | Payer: Medicare Other | Source: Ambulatory Visit | Attending: Family Medicine | Admitting: Family Medicine

## 2012-01-26 ENCOUNTER — Other Ambulatory Visit: Payer: Self-pay | Admitting: Family Medicine

## 2012-01-26 DIAGNOSIS — Z139 Encounter for screening, unspecified: Secondary | ICD-10-CM

## 2012-01-27 ENCOUNTER — Ambulatory Visit
Admission: RE | Admit: 2012-01-27 | Discharge: 2012-01-27 | Disposition: A | Discharge status: Home / Self Care | Payer: Medicare Other | Source: Ambulatory Visit | Attending: Family Medicine | Admitting: Family Medicine

## 2012-01-27 DIAGNOSIS — M810 Age-related osteoporosis without current pathological fracture: Secondary | ICD-10-CM

## 2012-01-29 ENCOUNTER — Ambulatory Visit
Admission: RE | Admit: 2012-01-29 | Discharge: 2012-01-29 | Disposition: A | Discharge status: Home / Self Care | Payer: Medicare Other | Source: Ambulatory Visit | Attending: Family Medicine | Admitting: Family Medicine

## 2012-03-05 ENCOUNTER — Ambulatory Visit (AMBULATORY_SURGERY_CENTER): Payer: Medicare Other

## 2012-03-05 VITALS — Ht 66.0 in | Wt 251.6 lb

## 2012-03-05 DIAGNOSIS — Z860100 Personal history of colon polyps, unspecified: Secondary | ICD-10-CM

## 2012-03-05 MED ORDER — MOVIPREP 100 G PO SOLR: 1.0000 | Freq: Once | ORAL | Status: DC

## 2012-03-08 ENCOUNTER — Encounter: Payer: Self-pay | Admitting: Internal Medicine

## 2012-03-19 ENCOUNTER — Ambulatory Visit (AMBULATORY_SURGERY_CENTER): Payer: Medicare Other | Admitting: Internal Medicine

## 2012-03-19 ENCOUNTER — Encounter: Payer: Self-pay | Admitting: Internal Medicine

## 2012-03-19 VITALS — BP 133/72 | HR 52 | Temp 97.6°F | Resp 41 | Ht 66.0 in | Wt 251.0 lb

## 2012-03-19 DIAGNOSIS — Z86010 Personal history of colonic polyps: Secondary | ICD-10-CM

## 2012-03-19 DIAGNOSIS — D126 Benign neoplasm of colon, unspecified: Secondary | ICD-10-CM

## 2012-03-19 LAB — GLUCOSE, CAPILLARY
Glucose-Capillary: 176 mg/dL — ABNORMAL HIGH (ref 70–99)
Glucose-Capillary: 134 mg/dL — ABNORMAL HIGH (ref 70–99)

## 2012-03-19 MED ORDER — SODIUM CHLORIDE 0.9 % IV SOLN: 500.0000 mL | INTRAVENOUS | Status: DC

## 2012-03-19 NOTE — Op Note (Signed)
[facility] [address] [place], [address]   COLONOSCOPY PROCEDURE REPORT  PATIENT: Diana [name], Name  MR#: [id number] BIRTHDATE: 10-28-1933 , 78  yrs. old GENDER: Female ENDOSCOPIST: Hart Carwin, MD REFERRED BY:  Marjory Lies, M.D. PROCEDURE DATE:  03/19/2012 PROCEDURE:   Colonoscopy with snare polypectomy ASA CLASS:   Class III INDICATIONS:Patient's personal history of adenomatous colon polyps and last colonoscopy 2002 showed 3 adenomatous polyps. MEDICATIONS: MAC sedation, administered by CRNA and propofol (Diprivan) 400mg  IV  DESCRIPTION OF PROCEDURE:   After the risks and benefits and of the procedure were explained, informed consent was obtained.  A digital rectal exam revealed no abnormalities of the rectum.    The LB [id number]-[id number]  endoscope was introduced through the anus and advanced to the cecum, which was identified by both the appendix and ileocecal valve .  The quality of the prep was good, using MoviPrep .  The instrument was then slowly withdrawn as the colon was fully examined.     COLON FINDINGS: Four smooth sessile polyps ranging between 5-23mm in size were found in the ascending colon. at 100, 90 80 and70 cm.  A polypectomy was performed using snare cautery.all polyps removed in multiple pieces,  The resection was complete and the polyp tissue was completely retrieved. There was severe diverticulosis of the sigmoid colon    Retroflexed views revealed no abnormalities. The scope was then withdrawn from the patient and the procedure completed.  COMPLICATIONS: There were no complications. ENDOSCOPIC IMPRESSION: Four sessile polyps ranging between 5-59mm in size were found in the ascending colon; polypectomy was performed using snare cautery severe diverticulosis of the sigmoid colon with  narrow lumen and musculat hypertrophy  RECOMMENDATIONS: await pathology report high fiber diet  REPEAT EXAM: In 1 year(s)  for  Colonoscopy. due to multiple polyps and sessile nature of them, to assure complete removal  cc:  _______________________________ eSigned:  Hart Carwin, MD 03/19/2012 12:14 PM     PATIENT NAME:  Diana [name], [place] MR#: [id number]

## 2012-03-19 NOTE — Progress Notes (Signed)
1210 a/ox3 pleased with MAC report to April RN

## 2012-03-19 NOTE — Patient Instructions (Addendum)

## 2012-03-19 NOTE — Progress Notes (Signed)
Patient did not experience any of the following events: a burn prior to discharge; a fall within the facility; wrong site/side/patient/procedure/implant event; or a hospital transfer or hospital admission upon discharge from the facility. (G8907) Patient did not have preoperative order for IV antibiotic SSI prophylaxis. (G8918)  

## 2012-03-22 ENCOUNTER — Telehealth: Payer: Self-pay | Admitting: *Deleted

## 2012-03-22 NOTE — Telephone Encounter (Signed)
  Follow up Call-  Call back number 03/19/2012  Post procedure Call Back phone  # [phone]  Permission to leave phone message Yes     Patient questions:  Do you have a fever, pain , or abdominal swelling? no Pain Score  0 *  Have you tolerated food without any problems? yes  Have you been able to return to your normal activities? yes  Do you have any questions about your discharge instructions: Diet   no Medications  no Follow up visit  no  Do you have questions or concerns about your Care? no  Actions: * If pain score is 4 or above: No action needed, pain <4.

## 2012-03-23 ENCOUNTER — Encounter: Payer: Self-pay | Admitting: Internal Medicine

## 2012-08-17 ENCOUNTER — Other Ambulatory Visit: Payer: Self-pay | Admitting: *Deleted

## 2012-08-17 MED ORDER — VERAPAMIL HCL 240 MG (CO) PO TB24: 240.0000 mg | ORAL_TABLET | Freq: Every day | ORAL | Status: DC

## 2012-09-17 ENCOUNTER — Other Ambulatory Visit: Payer: Self-pay | Admitting: *Deleted

## 2012-09-17 MED ORDER — LOSARTAN POTASSIUM 50 MG PO TABS: 50.0000 mg | ORAL_TABLET | Freq: Every day | ORAL | Status: DC

## 2012-09-17 NOTE — Telephone Encounter (Signed)
Rx was sent to pharmacy electronically. 

## 2012-11-28 ENCOUNTER — Telehealth: Payer: Self-pay | Admitting: *Deleted

## 2012-11-28 NOTE — Telephone Encounter (Signed)
Faxed CPAP order supply back.

## 2012-12-08 ENCOUNTER — Encounter: Payer: Self-pay | Admitting: Cardiovascular Disease

## 2013-02-16 ENCOUNTER — Encounter: Payer: Self-pay | Admitting: Internal Medicine

## 2013-05-05 ENCOUNTER — Encounter: Payer: Self-pay | Admitting: *Deleted

## 2013-05-09 ENCOUNTER — Ambulatory Visit (INDEPENDENT_AMBULATORY_CARE_PROVIDER_SITE_OTHER): Payer: Medicare Other | Admitting: Cardiovascular Disease

## 2013-05-09 ENCOUNTER — Encounter: Payer: Self-pay | Admitting: Cardiovascular Disease

## 2013-05-09 VITALS — BP 140/90 | HR 68 | Ht 66.0 in | Wt 250.4 lb

## 2013-05-09 DIAGNOSIS — R609 Edema, unspecified: Secondary | ICD-10-CM

## 2013-05-09 DIAGNOSIS — N289 Disorder of kidney and ureter, unspecified: Secondary | ICD-10-CM

## 2013-05-09 DIAGNOSIS — I10 Essential (primary) hypertension: Secondary | ICD-10-CM

## 2013-05-09 DIAGNOSIS — G4733 Obstructive sleep apnea (adult) (pediatric): Secondary | ICD-10-CM

## 2013-05-09 DIAGNOSIS — E119 Type 2 diabetes mellitus without complications: Secondary | ICD-10-CM

## 2013-05-09 DIAGNOSIS — R600 Localized edema: Secondary | ICD-10-CM

## 2013-05-09 DIAGNOSIS — Z9989 Dependence on other enabling machines and devices: Secondary | ICD-10-CM

## 2013-05-09 DIAGNOSIS — E039 Hypothyroidism, unspecified: Secondary | ICD-10-CM

## 2013-05-09 NOTE — Patient Instructions (Signed)
Your physician recommends that you schedule a follow-up appointment  And echocardiogram in 1 year.

## 2013-05-10 ENCOUNTER — Telehealth (HOSPITAL_COMMUNITY): Payer: Self-pay | Admitting: *Deleted

## 2013-05-15 ENCOUNTER — Encounter: Payer: Self-pay | Admitting: Cardiovascular Disease

## 2013-05-15 DIAGNOSIS — R600 Localized edema: Secondary | ICD-10-CM | POA: Insufficient documentation

## 2013-05-15 DIAGNOSIS — E119 Type 2 diabetes mellitus without complications: Secondary | ICD-10-CM | POA: Insufficient documentation

## 2013-05-15 DIAGNOSIS — E041 Nontoxic single thyroid nodule: Secondary | ICD-10-CM | POA: Insufficient documentation

## 2013-05-15 DIAGNOSIS — I10 Essential (primary) hypertension: Secondary | ICD-10-CM | POA: Insufficient documentation

## 2013-05-15 DIAGNOSIS — G4733 Obstructive sleep apnea (adult) (pediatric): Secondary | ICD-10-CM | POA: Insufficient documentation

## 2013-05-15 DIAGNOSIS — N289 Disorder of kidney and ureter, unspecified: Secondary | ICD-10-CM | POA: Insufficient documentation

## 2013-05-15 DIAGNOSIS — I129 Hypertensive chronic kidney disease with stage 1 through stage 4 chronic kidney disease, or unspecified chronic kidney disease: Secondary | ICD-10-CM | POA: Insufficient documentation

## 2013-05-15 DIAGNOSIS — E669 Obesity, unspecified: Secondary | ICD-10-CM | POA: Insufficient documentation

## 2013-05-15 DIAGNOSIS — E039 Hypothyroidism, unspecified: Secondary | ICD-10-CM | POA: Insufficient documentation

## 2013-05-15 DIAGNOSIS — N183 Type 2 diabetes mellitus with diabetic chronic kidney disease: Secondary | ICD-10-CM | POA: Insufficient documentation

## 2013-05-15 DIAGNOSIS — Z9989 Dependence on other enabling machines and devices: Secondary | ICD-10-CM

## 2013-05-15 NOTE — Progress Notes (Signed)
Patient ID: Diana [name], female   DOB: Jun 11, 1933, 80 y.o.   MRN: [id number]     HPI: Diana [name] is a 80 y.o. female presents to the office for one year cardiology evaluation.  Diana [name] has a history of hypertension, hyperlipidemia, obesity, type 2 diabetes mellitus, hypothyroidism, renal insufficiency, as well as obstructive sleep apnea for which he is on CPAP therapy. Her last echo Doppler study was in 2011 which showed normal systolic function with grade 1 diastolic dysfunction. She had mild to moderate LA dilatation, mild/moderate annular calcification with mild MR, mild TR, and also had moderately sclerotic aortic valve with peak and mean gradients of 15 and 7 mm, respectively. In 2009 she had undergone a nuclear perfusion study which showed diaphragmatic attenuation but was without ischemia.  Over the past year, she has been on losartan 50 mg furosemide 40 mg and for rapid alternative 40 mg for blood pressure control as well as leg swelling. She has been on Synthroid 50 mcg for her hypothyroidism. She is taking Zocor 20 mg in addition to fish oil for hyperlipidemia. She has been on Amaryl and Lantus insulin for her diabetes.  She lives by herself. She denies recent chest pain or shortness of breath. She did sustain a broken metatarsal in her left foot. She also notes bilateral leg swelling left greater than right. She presents for one-year evaluation.  Past Medical History  Diagnosis Date  . Osteopenia   . Cataract   . Obesity   . Hypertension   . Type 2 diabetes mellitus   . Hyperlipidemia   . Anemia   . Anemia of decreased vitamin B12 absorption 05/26/2005  . Renal insufficiency   . Hx of colonic polyps   . Breast cancer 2001  . Hypothyroidism   . OSA on CPAP 09/2007    AHI 10.6/hr overall, 43.64/hr during REM  . History of tobacco abuse     Past Surgical History  Procedure Laterality Date  . Mastectomy Bilateral 2001    bilateral sentinel lymph nodes  bio  . Back surgery  05/30/2009  . Cataract extraction Bilateral     bilateral  . Cholecystectomy  1974  . Bowel resection  1974  . Total abdominal hysterectomy  1975  . Transthoracic echocardiogram  02/2010    MO=>29%, stage 1 diastolic dysfunction; borderline RV enlargement; LA mild-mod dilated; mild mitral annular calcif & mild MR; mild TR with normal RSVP, AV moderately sclerotics  . Nm myocar perf wall motion  06/2007    dipyridamole; perfusion defect in inferior myocardium consistent with diaphragmatic attenuation, remaining myocardium with no ischemia/infarct, EF 73%; normal, low risk scan     Allergies  Allergen Reactions  . Codeine Sulfate Nausea Only    Current Outpatient Prescriptions  Medication Sig Dispense Refill  . aspirin 81 MG tablet Take 81 mg by mouth daily.       . calcium-vitamin D (OSCAL WITH D) 500-200 MG-UNIT per tablet Take 2 tablets by mouth daily.        . cholecalciferol (VITAMIN D) 1000 UNITS tablet Take 1,000 Units by mouth daily. 2 tabs      . co-enzyme Q-10 30 MG capsule Take 30 mg by mouth 3 (three) times daily.        . COMBIGAN 0.2-0.5 % ophthalmic solution       . Cyanocobalamin (VITAMIN B 12 PO) Take by mouth.        . fish oil-omega-3 fatty acids 1000 MG capsule Take  1 g by mouth daily.       Marland Kitchen FLUoxetine (PROZAC) 20 MG capsule Take 20 mg by mouth.        . furosemide (LASIX) 40 MG tablet Take 40 mg by mouth daily.       Marland Kitchen glimepiride (AMARYL) 1 MG tablet Take 1 mg by mouth 2 (two) times daily before a meal.        . insulin glargine (LANTUS) 100 UNIT/ML injection Inject into the skin at bedtime. Sliding scale      . levothyroxine (SYNTHROID, LEVOTHROID) 50 MCG tablet Take 50 mcg by mouth daily.        Marland Kitchen losartan (COZAAR) 50 MG tablet Take 1 tablet (50 mg total) by mouth daily.  30 tablet  11  . magnesium oxide (MAG-OX) 400 MG tablet Take 400 mg by mouth every other day.        . Multiple Vitamins-Minerals (ICAPS) CAPS Take 1 capsule by mouth  daily.      . multivitamin (THERAGRAN) per tablet Take 1 tablet by mouth daily.        . NON FORMULARY CPAP      . potassium chloride (KLOR-CON) 10 MEQ CR tablet Take 20 mEq by mouth every other day.       . simvastatin (ZOCOR) 20 MG tablet Take 20 mg by mouth at bedtime.        . verapamil (COVERA HS) 240 MG (CO) 24 hr tablet Take 1 tablet (240 mg total) by mouth at bedtime.  30 tablet  6   No current facility-administered medications for this visit.    History   Social History  . Marital Status: Married    Spouse Name: N/A    Number of Children: N/A  . Years of Education: N/A   Occupational History  . Not on file.   Social History Main Topics  . Smoking status: Former Smoker -- 13 years    Types: Cigarettes    Quit date: 05/02/2002  . Smokeless tobacco: Not on file  . Alcohol Use: No  . Drug Use: Not on file  . Sexual Activity: Not on file   Other Topics Concern  . Not on file   Social History Narrative  . No narrative on file   Socially she lives by herself. She is widowed for 4 years. 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 She has a BA degree from college.  Family History  Problem Relation Age of Onset  . Colon cancer Neg Hx   . Heart attack Mother   . Heart attack Father     ROS is negative for fevers, chills or night sweats. She denies skin rash. She denies change in vision or hearing. She denies lymphadenopathy. She does admit to significant musculoskeletal symptoms and states she has bone-on-bone in her knees. She walks with a cane. She denies PND orthopnea. She is unaware of syncope or presyncope. She denies cough or increased sputum production. She denies chest pressure. She denies nausea vomiting or diarrhea. She denies change in bowel or bladder habits. She denies myalgias. She denies claudication. She does admit to leg swelling. She is diabetic. She does have hypothyroidism. She does have obstructive  sleep apnea.  Other comprehensive 14 point system review is negative.  PE BP 140/90  Pulse 68  Ht $R'5\' 6"'dY$  (1.676 m)  Wt 250 lb 6.4 oz (113.581 kg)  BMI 40.44 kg/m2  General: Alert, oriented, no distress.  Skin: normal turgor, no rashes HEENT: Normocephalic, atraumatic. Pupils round and reactive; sclera anicteric;no lid lag. Extraocular muscles intact; no xanthelasmas. Nose without nasal septal hypertrophy Mouth/Parynx benign; Mallinpatti scale 3 Neck: No JVD, no carotid bruits; normal carotid upstroke Lungs: clear to ausculatation and percussion; no wheezing or rales Chest wall: no tenderness to palpitation Heart: RRR, s1 s2 normal; 2/6 systolic murmur in the aortic region no diastolic murmur, rub thrills or heaves Abdomen: soft, nontender; no hepatosplenomehaly, BS+; abdominal aorta nontender and not dilated by palpation. Back: no CVA tenderness Pulses 2+ Extremities: Mild bilateral ankle swelling left greater than right no clubbing cyanosis, Homan's sign negative  Neurologic: grossly nonfocal; cranial nerves grossly normal. Psychologic: normal affect and mood.  ECG (independently read by me): Normal sinus rhythm at 68 beats per minute with atrial premature complex. No significant ST-T changes.  I did review and obstructive sleep apnea download dated from 01/02/2012 through 11/29/2012. This revealed excellent CPAP compliance with use of 98.8%. She is on a fixed CPAP pressure of 13 cm and her AHI was excellent at 2.7. She is averaging 9 hours and 40 minutes of CPAP use daily.  LABS:  BMET    Component Value Date/Time   NA 139 03/18/2011 1135   K 3.7 03/18/2011 1135   CL 102 03/18/2011 1135   CO2 26 03/18/2011 1135   GLUCOSE 281* 03/18/2011 1135   BUN 27* 03/18/2011 1135   CREATININE 1.70* 03/18/2011 1135   CALCIUM 8.4 03/18/2011 1135   GFRNONAA 25* 07/09/2009 0520   GFRAA  Value: 31        The eGFR has been calculated using the MDRD equation. This calculation has not been validated in all  clinical situations. eGFR's persistently <60 mL/min signify possible Chronic Kidney Disease.* 07/09/2009 0520     Hepatic Function Panel     Component Value Date/Time   PROT 6.3 03/18/2011 1135   ALBUMIN 3.7 03/18/2011 1135   AST 17 03/18/2011 1135   ALT 14 03/18/2011 1135   ALKPHOS 73 03/18/2011 1135   BILITOT 0.7 03/18/2011 1135     CBC    Component Value Date/Time   WBC 9.0 03/18/2011 1135   WBC 6.2 07/08/2009 0617   RBC 3.93 03/18/2011 1135   RBC 2.80* 07/08/2009 0617   RBC 3.17* 07/05/2009 1005   HGB 12.1 03/18/2011 1135   HGB 8.7* 07/08/2009 0617   HCT 35.8 03/18/2011 1135   HCT 26.3* 07/08/2009 0617   PLT 195 03/18/2011 1135   PLT 220 07/08/2009 0617   MCV 91.3 03/18/2011 1135   MCV 93.9 07/08/2009 0617   MCH 30.9 03/18/2011 1135   MCHC 33.8 03/18/2011 1135   MCHC 33.0 07/08/2009 0617   RDW 13.5 03/18/2011 1135   RDW 17.5* 07/08/2009 0617   LYMPHSABS 0.7* 03/18/2011 1135   LYMPHSABS 0.5* 07/05/2009 1005   MONOABS 0.7 03/18/2011 1135   MONOABS 0.7 07/05/2009 1005   EOSABS 0.1 03/18/2011 1135   EOSABS 0.1 07/05/2009 1005   BASOSABS 0.0 03/18/2011 1135   BASOSABS 0.0 07/05/2009 1005     BNP    Component Value Date/Time   PROBNP 202.0* 07/05/2009 1005    Lipid Panel     Component Value Date/Time   CHOL  Value: 115        ATP III CLASSIFICATION:  <200     mg/dL   Desirable  200-239  mg/dL   Borderline High  >=240    mg/dL   High  07/05/2009 1005   TRIG 108 07/05/2009 1005   HDL 42 07/05/2009 1005   CHOLHDL 2.7 07/05/2009 1005   VLDL 22 07/05/2009 1005   LDLCALC  Value: 51        Total Cholesterol/HDL:CHD Risk Coronary Heart Disease Risk Table                     Men   Women  1/2 Average Risk   3.4   3.3  Average Risk       5.0   4.4  2 X Average Risk   9.6   7.1  3 X Average Risk  23.4   11.0        Use the calculated Patient Ratio above and the CHD Risk Table to determine the patient's CHD Risk.        ATP III CLASSIFICATION (LDL):  <100     mg/dL   Optimal  100-129  mg/dL   Near or Above                     Optimal  130-159  mg/dL   Borderline  160-189  mg/dL   High  >190     mg/dL   Very High 07/05/2009 1005     RADIOLOGY: No results found.    ASSESSMENT AND PLAN: Diana [name] is a 80 year old female with history of hypertension, hyperlipidemia, obesity, type 2 diabetes mellitus, hypothyroidism, renal insufficiency, as well as obstructive sleep apnea. Her blood pressure today is well controlled on her current therapy as noted above. I reviewed her CPAP download with over a years [facility] continues to show excellent CPAP therapy and usage with AHI at 2.7. She sees Dr. Rolan Lipa for her disease. If she's not had recent laboratory obtained laboratory in the fasting state. Clinically, she is well compensated on her multiple medical regimen. I have recommended that next year she undergo a five-year followup echo Doppler study to further evaluate her systolic and diastolic function and particularly to reassess her aortic valve. I will see her one year for cardiology reevaluation or sooner if problems arise.     Troy Sine, MD, [facility]  05/15/2013 12:49 PM

## 2013-06-09 ENCOUNTER — Telehealth (HOSPITAL_COMMUNITY): Payer: Self-pay | Admitting: *Deleted

## 2013-06-10 ENCOUNTER — Telehealth (HOSPITAL_COMMUNITY): Payer: Self-pay | Admitting: *Deleted

## 2013-07-05 ENCOUNTER — Telehealth (HOSPITAL_COMMUNITY): Payer: Self-pay | Admitting: *Deleted

## 2013-07-12 ENCOUNTER — Ambulatory Visit (HOSPITAL_COMMUNITY)
Admission: RE | Admit: 2013-07-12 | Discharge: 2013-07-12 | Disposition: A | Discharge status: Home / Self Care | Payer: Medicare Other | Source: Ambulatory Visit | Attending: Cardiovascular Disease | Admitting: Cardiovascular Disease

## 2013-07-12 DIAGNOSIS — I369 Nonrheumatic tricuspid valve disorder, unspecified: Secondary | ICD-10-CM

## 2013-07-12 DIAGNOSIS — I10 Essential (primary) hypertension: Secondary | ICD-10-CM

## 2013-07-12 NOTE — Progress Notes (Signed)
2D Echocardiogram Complete.  07/12/2013   Austan Nicholl, RDCS 

## 2013-07-15 ENCOUNTER — Ambulatory Visit (AMBULATORY_SURGERY_CENTER): Payer: Self-pay

## 2013-07-15 ENCOUNTER — Other Ambulatory Visit: Payer: Self-pay | Admitting: Cardiovascular Disease

## 2013-07-15 VITALS — Ht 66.0 in | Wt 253.0 lb

## 2013-07-15 DIAGNOSIS — Z860100 Personal history of colon polyps, unspecified: Secondary | ICD-10-CM

## 2013-07-15 MED ORDER — MOVIPREP 100 G PO SOLR: 1.0000 | Freq: Once | ORAL | Status: DC

## 2013-07-15 NOTE — Progress Notes (Signed)
No allergies to eggs or soy No home oxygen except CPAP No diet/weight loss meds No past problems with anesthesia No email

## 2013-07-15 NOTE — Telephone Encounter (Signed)
Rx refill sent to patient pharmacy   

## 2013-07-29 ENCOUNTER — Encounter: Payer: Self-pay | Admitting: Internal Medicine

## 2013-07-29 ENCOUNTER — Ambulatory Visit (AMBULATORY_SURGERY_CENTER): Payer: Medicare Other | Admitting: Internal Medicine

## 2013-07-29 VITALS — BP 123/71 | HR 67 | Temp 97.2°F | Resp 21 | Ht 66.0 in | Wt 253.0 lb

## 2013-07-29 DIAGNOSIS — Z86010 Personal history of colonic polyps: Secondary | ICD-10-CM

## 2013-07-29 DIAGNOSIS — D126 Benign neoplasm of colon, unspecified: Secondary | ICD-10-CM

## 2013-07-29 LAB — GLUCOSE, CAPILLARY: Glucose-Capillary: 210 mg/dL — ABNORMAL HIGH (ref 70–99)

## 2013-07-29 MED ORDER — FLEET ENEMA 7-19 GM/118ML RE ENEM
1.0000 | ENEMA | Freq: Once | RECTAL | Status: AC
  Administered 2013-07-29: 1 via RECTAL

## 2013-07-29 MED ORDER — FLEET PEDIATRIC 3.5-9.5 GM/59ML RE ENEM: 1.0000 | ENEMA | Freq: Once | RECTAL | Status: DC

## 2013-07-29 MED ORDER — SODIUM CHLORIDE 0.9 % IV SOLN: 500.0000 mL | INTRAVENOUS | Status: DC

## 2013-07-29 NOTE — Progress Notes (Signed)
0935 Fleet enema instilled.  Clear results with occasional fecal particle.

## 2013-07-29 NOTE — Patient Instructions (Addendum)
YOU HAD AN ENDOSCOPIC PROCEDURE TODAY AT THE [facility]: Refer to the procedure report that was given to you for any specific questions about what was found during the examination.  If the procedure report does not answer your questions, please call your gastroenterologist to clarify.  If you requested that your care partner not be given the details of your procedure findings, then the procedure report has been included in a sealed envelope for you to review at your convenience later.  YOU SHOULD EXPECT: Some feelings of bloating in the abdomen. Passage of more gas than usual.  Walking can help get rid of the air that was put into your GI tract during the procedure and reduce the bloating. If you had a lower endoscopy (such as a colonoscopy or flexible sigmoidoscopy) you may notice spotting of blood in your stool or on the toilet paper. If you underwent a bowel prep for your procedure, then you may not have a normal bowel movement for a few days.  DIET: Your first meal following the procedure should be a light meal and then it is ok to progress to your normal diet.  A half-sandwich or bowl of soup is an example of a good first meal.  Heavy or fried foods are harder to digest and may make you feel nauseous or bloated.  Likewise meals heavy in dairy and vegetables can cause extra gas to form and this can also increase the bloating.  Drink plenty of fluids but you should avoid alcoholic beverages for 24 hours.  ACTIVITY: Your care partner should take you home directly after the procedure.  You should plan to take it easy, moving slowly for the rest of the day.  You can resume normal activity the day after the procedure however you should NOT DRIVE or use heavy machinery for 24 hours (because of the sedation medicines used during the test).    SYMPTOMS TO REPORT IMMEDIATELY: A gastroenterologist can be reached at any hour.  During normal business hours, 8:30 AM to 5:00 PM Monday through Friday,  call [phone].  After hours and on weekends, please call the GI answering service at [phone] who will take a message and have the physician on call contact you.   Following lower endoscopy (colonoscopy or flexible sigmoidoscopy):  Excessive amounts of blood in the stool  Significant tenderness or worsening of abdominal pains  Swelling of the abdomen that is new, acute  Fever of 100F or higher  FOLLOW UP: If any biopsies were taken you will be contacted by phone or by letter within the next 1-3 weeks.  Call your gastroenterologist if you have not heard about the biopsies in 3 weeks.  Our staff will call the home number listed on your records the next business day following your procedure to check on you and address any questions or concerns that you may have at that time regarding the information given to you following your procedure. This is a courtesy call and so if there is no answer at the home number and we have not heard from you through the emergency physician on call, we will assume that you have returned to your regular daily activities without incident.  SIGNATURES/CONFIDENTIALITY: You and/or your care partner have signed paperwork which will be entered into your electronic medical record.  These signatures attest to the fact that that the information above on your After Visit Summary has been reviewed and is understood.  Full responsibility of the confidentiality of this   discharge information lies with you and/or your care-partner.  Recommendations Await pathology results No aspirin or anti-inflammatory medications for 2 weeks High fiber diet

## 2013-07-29 NOTE — Op Note (Signed)
[facility]  [employer] [place], [id number]   COLONOSCOPY PROCEDURE REPORT  PATIENT: Diana [name], Diana [name]  MR#: [id number] BIRTHDATE: Aug 04, 1933 , 31  yrs. old GENDER: Female ENDOSCOPIST: Lafayette Dragon, MD REFERRED OV:FIEPP Tollie Pizza, M.D. PROCEDURE DATE:  07/29/2013 PROCEDURE:   Colonoscopy with cold biopsy polypectomy and Colonoscopy with snare polypectomy First Screening Colonoscopy - Avg.  risk and is 50 yrs.  old or older - No.  Prior Negative Screening - Now for repeat screening. N/A  History of Adenoma - Now for follow-up colonoscopy & has been > or = to 3 yrs.  N/A  Polyps Removed Today? Yes. ASA CLASS:   Class II INDICATIONS:mmultiple adenomatous polyps found on colonoscopy in 2002 and in January 2014 one of them was serrated adenoma. MEDICATIONS: MAC sedation, administered by CRNA and Propofol (Diprivan) 220 mg IV  DESCRIPTION OF PROCEDURE:   After the risks benefits and alternatives of the procedure were thoroughly explained, informed consent was obtained.  A digital rectal exam revealed no abnormalities of the rectum.   The LB [id number]  endoscope was introduced through the anus and advanced to the cecum, which was identified by both the appendix and ileocecal valve. No adverse events experienced.   The quality of the prep was Moviprep fair The instrument was then slowly withdrawn as the colon was fully examined.      COLON FINDINGS: There was moderate diverticulosis noted throughout the entire examined colon with associated angulation, tortuosity and muscular hypertrophy.   Eight polypoid shaped semi-pedunculated and sessile  polyps measuring 3- 15 mm in size were found throughout the entire examined colon, most of them in the ascending and transverse colon..  A polypectomy was performed with cold forceps, with a cold snare and using snare cautery.  The resection was complete and the polyp tissue was completely retrieved. Retroflexed  views revealed no abnormalities. The time to cecum=7 minutes 54 seconds.  Withdrawal time=18 minutes 39 seconds.  The scope was withdrawn and the procedure completed. COMPLICATIONS: There were no complications.  ENDOSCOPIC IMPRESSION: 1.   There was moderate diverticulosis noted throughout the entire examined colon 2.   Eight semi-pedunculated and sessile  polyps measuring 3-15 mm in size were found throughout the entire examined colon; polypectomy was performed with cold forceps, with a cold snare and using snare cautery  RECOMMENDATIONS: 1.  Await pathology results 2.  no aspirin or anti-inflammatory medications for 2 weeks High-fiber diet Recall colonoscopy pending path report,we will take into consideration patient's age I deciding whether she should have a recall colonoscopy   eSigned:  Lafayette Dragon, MD 07/29/2013 10:51 AM   cc:   PATIENT NAME:  Diana [name], Diana [name] MR#: [id number]

## 2013-07-29 NOTE — Progress Notes (Signed)
Procedure ends, to recovery, report given and VSS. 

## 2013-07-29 NOTE — Progress Notes (Signed)
Called to room to assist during endoscopic procedure.  Patient ID and intended procedure confirmed with present staff. Received instructions for my participation in the procedure from the performing physician.  

## 2013-08-02 ENCOUNTER — Encounter: Payer: Self-pay | Admitting: *Deleted

## 2013-08-02 ENCOUNTER — Telehealth: Payer: Self-pay | Admitting: *Deleted

## 2013-08-02 LAB — GLUCOSE, CAPILLARY: Glucose-Capillary: 190 mg/dL — ABNORMAL HIGH (ref 70–99)

## 2013-08-02 NOTE — Telephone Encounter (Signed)
  Follow up Call-  Call back number 07/29/2013 03/19/2012  Post procedure Call Back phone  # [phone]  Permission to leave phone message Yes Yes     Patient questions:  Do you have a fever, pain , or abdominal swelling? no Pain Score  0 *  Have you tolerated food without any problems? yes  Have you been able to return to your normal activities? yes  Do you have any questions about your discharge instructions: Diet   no Medications  no Follow up visit  no  Do you have questions or concerns about your Care? no  Actions: * If pain score is 4 or above: No action needed, pain <4.

## 2013-08-05 ENCOUNTER — Encounter: Payer: Self-pay | Admitting: Internal Medicine

## 2013-08-09 ENCOUNTER — Encounter: Payer: Self-pay | Admitting: *Deleted

## 2013-08-12 ENCOUNTER — Other Ambulatory Visit: Payer: Self-pay | Admitting: Cardiovascular Disease

## 2013-08-15 NOTE — Telephone Encounter (Signed)
Rx was sent to pharmacy electronically. 

## 2013-08-16 ENCOUNTER — Other Ambulatory Visit: Payer: Self-pay | Admitting: Cardiovascular Disease

## 2013-08-17 NOTE — Telephone Encounter (Signed)
Refilled #30 capsule with 5 refills on 07/15/2013 Refill request refused.

## 2013-09-17 ENCOUNTER — Other Ambulatory Visit: Payer: Self-pay | Admitting: Cardiovascular Disease

## 2013-09-19 NOTE — Telephone Encounter (Signed)
Refilled # 30 tablet with 9 refills on 08/15/13

## 2014-01-12 ENCOUNTER — Other Ambulatory Visit: Payer: Self-pay | Admitting: Cardiovascular Disease

## 2014-01-13 NOTE — Telephone Encounter (Signed)
E sent to pharmacy 

## 2014-06-20 ENCOUNTER — Ambulatory Visit (INDEPENDENT_AMBULATORY_CARE_PROVIDER_SITE_OTHER): Payer: Medicare Other | Admitting: Cardiovascular Disease

## 2014-06-20 ENCOUNTER — Encounter: Payer: Self-pay | Admitting: Cardiovascular Disease

## 2014-06-20 VITALS — BP 118/82 | HR 82 | Ht 66.0 in | Wt 247.1 lb

## 2014-06-20 DIAGNOSIS — I10 Essential (primary) hypertension: Secondary | ICD-10-CM | POA: Diagnosis not present

## 2014-06-20 DIAGNOSIS — Z9989 Dependence on other enabling machines and devices: Secondary | ICD-10-CM

## 2014-06-20 DIAGNOSIS — R600 Localized edema: Secondary | ICD-10-CM

## 2014-06-20 DIAGNOSIS — N289 Disorder of kidney and ureter, unspecified: Secondary | ICD-10-CM | POA: Diagnosis not present

## 2014-06-20 DIAGNOSIS — E039 Hypothyroidism, unspecified: Secondary | ICD-10-CM

## 2014-06-20 DIAGNOSIS — E119 Type 2 diabetes mellitus without complications: Secondary | ICD-10-CM

## 2014-06-20 DIAGNOSIS — G4733 Obstructive sleep apnea (adult) (pediatric): Secondary | ICD-10-CM

## 2014-06-20 NOTE — Patient Instructions (Signed)
Your physician wants you to follow-up in: 1 year or sooner if needed. You will receive a reminder letter in the mail two months in advance. If you don't receive a letter, please call our office to schedule the follow-up appointment.  

## 2014-06-20 NOTE — Progress Notes (Signed)
Patient ID: Diana [name], female   DOB: 02-22-1934, 81 y.o.   MRN: [id number]     HPI: Diana [name] is a 81 y.o. female presents to the office for one year cardiology evaluation.  [name] has a history of hypertension, hyperlipidemia, obesity, type 2 diabetes mellitus, hypothyroidism, renal insufficiency, as well as obstructive sleep apnea on CPAP therapy.  In 2009  a nuclear perfusion study showed diaphragmatic attenuation but was without ischemia. An echo Doppler study in 2011 showed normal systolic function with grade 1 diastolic dysfunction. She had mild to moderate LA dilatation, mild/moderate annular calcification with mild MR, mild TR, and also had moderately sclerotic aortic valve with peak and mean gradients of 15 and 7 mm, respectively.  Since I saw her one year ago, she underwent a four-year follow-up echo Doppler study in May 2015.  This revealed an ejection fraction of 55-60%.  Left reticular diastolic parameters were normal.  There was trivial aortic insufficiency with mild aortic valve sclerosis without stenosis.  The peak gradient was 13 mm.  Over the past year, she states she has done fairly well.  She denies recurrent episodes of chest pain or significant shortness of breath.  She walks with a cane.  At times she experiences some mild ankle swelling.  She sees Dr. Vanita Ingles for her renal insufficiency.  She is been using her CPAP with 100% compliance.  She has bilateral bone-on-bone of her knees may require future injections.  She tells me she will be seeing Dr. Tollie Pizza next week and laboratory will be obtained.   Past Medical History  Diagnosis Date  . Osteopenia   . Cataract   . Obesity   . Hypertension   . Type 2 diabetes mellitus   . Hyperlipidemia   . Anemia   . Anemia of decreased vitamin B12 absorption 05/26/2005  . Renal insufficiency   . Hx of colonic polyps   . Breast cancer 2001  . Hypothyroidism   . OSA on CPAP 09/2007    AHI 10.6/hr  overall, 43.64/hr during REM  . History of tobacco abuse     Past Surgical History  Procedure Laterality Date  . Mastectomy Bilateral 2001    bilateral sentinel lymph nodes bio  . Back surgery  05/30/2009  . Cataract extraction Bilateral     bilateral  . Cholecystectomy  1974  . Bowel resection  1974  . Total abdominal hysterectomy  1975  . Transthoracic echocardiogram  02/2010    OB=>09%, stage 1 diastolic dysfunction; borderline RV enlargement; LA mild-mod dilated; mild mitral annular calcif & mild MR; mild TR with normal RSVP, AV moderately sclerotics  . Nm myocar perf wall motion  06/2007    dipyridamole; perfusion defect in inferior myocardium consistent with diaphragmatic attenuation, remaining myocardium with no ischemia/infarct, EF 73%; normal, low risk scan     Allergies  Allergen Reactions  . Codeine Sulfate Nausea Only    Current Outpatient Prescriptions  Medication Sig Dispense Refill  . aspirin 81 MG tablet Take 81 mg by mouth daily.     . calcium-vitamin D (OSCAL WITH D) 500-200 MG-UNIT per tablet Take 2 tablets by mouth daily.      . cholecalciferol (VITAMIN D) 1000 UNITS tablet Take 1,000 Units by mouth daily. 2 tabs    . co-enzyme Q-10 30 MG capsule Take 30 mg by mouth 3 (three) times daily.      . COMBIGAN 0.2-0.5 % ophthalmic solution     . Cyanocobalamin (VITAMIN  B 12 PO) Take by mouth.      . fish oil-omega-3 fatty acids 1000 MG capsule Take 1 g by mouth daily.     Marland Kitchen FLUoxetine (PROZAC) 20 MG capsule Take 20 mg by mouth.      . furosemide (LASIX) 40 MG tablet Take 40 mg by mouth daily.     Marland Kitchen glimepiride (AMARYL) 1 MG tablet Take 1 mg by mouth 2 (two) times daily before a meal.      . GLUCOSAMINE-CHONDROITIN PO Take 1 capsule by mouth daily. 1500/1200    . insulin glargine (LANTUS) 100 UNIT/ML injection Inject into the skin at bedtime. Sliding scale    . levothyroxine (SYNTHROID, LEVOTHROID) 50 MCG tablet Take 50 mcg by mouth daily.      Marland Kitchen losartan (COZAAR)  50 MG tablet TAKE 1 TABLET (50 MG TOTAL) BY MOUTH DAILY. 30 tablet 9  . magnesium oxide (MAG-OX) 400 MG tablet Take 400 mg by mouth every other day.      . Multiple Vitamins-Minerals (ICAPS) CAPS Take 1 capsule by mouth daily.    . multivitamin (THERAGRAN) per tablet Take 1 tablet by mouth daily.      . NON FORMULARY CPAP    . OVER THE COUNTER MEDICATION Eye caps- vitamins twice daily    . potassium chloride (KLOR-CON) 10 MEQ CR tablet Take 20 mEq by mouth every other day.     . Probiotic Product (PROBIOTIC DAILY PO) Take 1 capsule by mouth daily.    . simvastatin (ZOCOR) 20 MG tablet Take 20 mg by mouth at bedtime.      . verapamil (VERELAN PM) 240 MG 24 hr capsule TAKE 1 CAPSULE (240 MG TOTAL) BY MOUTH AT BEDTIME. 30 capsule 4   No current facility-administered medications for this visit.    History   Social History  . Marital Status: Married    Spouse Name: N/A  . Number of Children: N/A  . Years of Education: N/A   Occupational History  . Not on file.   Social History Main Topics  . Smoking status: Former Smoker -- 13 years    Types: Cigarettes    Quit date: 05/02/2002  . Smokeless tobacco: Not on file  . Alcohol Use: No  . Drug Use: Not on file  . Sexual Activity: Not on file   Other Topics Concern  . Not on file   Social History Narrative   Socially she lives by herself. She is widowed for 4 years. 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 She has a BA degree from college.  Family History  Problem Relation Age of Onset  . Colon cancer Neg Hx   . Heart attack Mother   . Heart attack Father    ROS General: Negative; No fevers, chills, or night sweats;  HEENT: Negative; No changes in vision or hearing, sinus congestion, difficulty swallowing Pulmonary: Negative; No cough, wheezing, shortness of breath, hemoptysis Cardiovascular: Negative; No chest pain, presyncope, syncope, palpitations GI: Negative; No  nausea, vomiting, diarrhea, or abdominal pain GU: Negative; No dysuria, hematuria, or difficulty voiding Musculoskeletal: Positive for arthritis.  Bone-on-bone in her knees.  She walks with a cane. Hematologic/Oncology: Negative; no easy bruising, bleeding Endocrine: Positive for diabetes mellitus and hypothyroidism. Neuro: Negative; no changes in balance, headaches Skin: Negative; No rashes or skin lesions Psychiatric: Negative; No behavioral problems, depression Sleep: Positive for obstructive sleep apnea on CPAP therapy.  No snoring, daytime sleepiness, hypersomnolence, bruxism,  restless legs, hypnogognic hallucinations, no cataplexy Other comprehensive 14 point system review is negative.   PE BP 118/82 mmHg  Pulse 82  Ht _0  (1.676 m)  Wt 247 lb 1.6 oz (112.084 kg)  BMI 39.90 kg/m2  General: Alert, oriented, no distress.  Skin: normal turgor, no rashes HEENT: Normocephalic, atraumatic. Pupils round and reactive; sclera anicteric;no lid lag. Extraocular muscles intact; no xanthelasmas. Nose without nasal septal hypertrophy Mouth/Parynx benign; Mallinpatti scale 3 Neck: No JVD, no carotid bruits; normal carotid upstroke Lungs: clear to ausculatation and percussion; no wheezing or rales Chest wall: no tenderness to palpitation Heart: RRR, s1 s2 normal; 2/6 systolic murmur in the aortic region no diastolic murmur, rub thrills or heaves Abdomen: soft, nontender; no hepatosplenomehaly, BS+; abdominal aorta nontender and not dilated by palpation. Back: no CVA tenderness Pulses 2+ Extremities: Trace ankle swelling left greater than right no clubbing cyanosis, Homan's sign negative  Neurologic: grossly nonfocal; cranial nerves grossly normal. Psychologic: normal affect and mood.  ECG  (independently read by me): Normal sinus rhythm at 82 bpm.  No ectopy.  QTc interval 453 ms.  ECG (independently read by me): Normal sinus rhythm at 68 beats per minute with atrial premature complex. No  significant ST-T changes.  LABS:  BMET  BMP Latest Ref Rng 03/18/2011 07/12/2010 01/22/2010  Glucose 70 - 99 mg/dL 281(H) 231(H) 240(H)  BUN 6 - 23 mg/dL 27(H) 24(H) 23  Creatinine 0.50 - 1.10 mg/dL 1.70(H) 1.62(H) 1.59(H)  Sodium 135 - 145 mEq/L 139 136 138  Potassium 3.5 - 5.3 mEq/L 3.7 3.8 3.9  Chloride 96 - 112 mEq/L 102 97 101  CO2 19 - 32 mEq/L _1 Calcium 8.4 - 10.5 mg/dL 8.4 8.9 9.0     Hepatic Function Panel   Hepatic Function Latest Ref Rng 03/18/2011 07/12/2010 01/22/2010  Total Protein 6.0 - 8.3 g/dL 6.3 6.9 6.8  Albumin 3.5 - 5.2 g/dL 3.7 4.1 4.0  AST 0 - 37 U/L _2 ALT 0 - 35 U/L _3 Alk Phosphatase 39 - 117 U/L 73 75 95  Total Bilirubin 0.3 - 1.2 mg/dL 0.7 0.7 0.5    CBC  CBC Latest Ref Rng 03/18/2011 07/12/2010 01/22/2010  WBC 3.9 - 10.3 10e3/uL 9.0 6.0 5.9  Hemoglobin 11.6 - 15.9 g/dL 12.1 12.8 12.7  Hematocrit 34.8 - 46.6 % 35.8 37.2 37.7  Platelets 145 - 400 10e3/uL 195 219 249    BNP    Component Value Date/Time   PROBNP 202.0* 07/05/2009 1005    Lipid Panel     Component Value Date/Time   CHOL  07/05/2009 1005    115        ATP III CLASSIFICATION:  <200     mg/dL   Desirable  200-239  mg/dL   Borderline High  >=240    mg/dL   High          TRIG 108 07/05/2009 1005   HDL 42 07/05/2009 1005   CHOLHDL 2.7 07/05/2009 1005   VLDL 22 07/05/2009 1005   LDLCALC  07/05/2009 1005    51        Total Cholesterol/HDL:CHD Risk Coronary Heart Disease Risk Table                     Men   Women  1/2 Average Risk   3.4   3.3  Average Risk       5.0   4.4  2  X Average Risk   9.6   7.1  3 X Average Risk  23.4   11.0        Use the calculated Patient Ratio above and the CHD Risk Table to determine the patient's CHD Risk.        ATP III CLASSIFICATION (LDL):  <100     mg/dL   Optimal  100-129  mg/dL   Near or Above                    Optimal  130-159  mg/dL   Borderline  160-189  mg/dL   High  >190     mg/dL   Very High      RADIOLOGY: No results found.    ASSESSMENT AND PLAN: Ms. Emanii Bugbee is a 81 year old female with history of hypertension, hyperlipidemia, obesity, type 2 diabetes mellitus, hypothyroidism, renal insufficiency, as well as obstructive sleep apnea.  She admits to 100% compliance with CPAP therapy and denies residual snoring or daytime sleepiness.  Her blood pressure today is well controlled on furosemide 40 mg daily, losartan 50 mg and verapamil 240 mg.  She has renal insufficiency and apparently had recent laboratory done by Dr. Corliss Parish.  I do not have these results.  She is on simvastatin 20 mg for hyperlipidemia and is taking fish oil with target LDL less than 70 in this diabetic female.  She is currently taking Lantus insulin in addition to glimepiride for her type 2 diabetes mellitus.  I reviewed her most recent echo Doppler study with her in detail.  This confirms systolic function is normal.  There is aortic sclerosis/minimal stenosis.  She is approaching morbid obesity with her weight and BMI 39.9.  Weight reduction was strongly recommended.  She will also undergoing complete laboratory by Dr. Tollie Pizza.  I will see her one year for cardiology evaluation.  Time spent: 25 minutes   Troy Sine, MD, [facility]  06/20/2014 5:43 PM

## 2014-07-19 ENCOUNTER — Other Ambulatory Visit: Payer: Self-pay | Admitting: Cardiovascular Disease

## 2014-07-19 NOTE — Telephone Encounter (Signed)
Rx has been sent to the pharmacy electronically. ° °

## 2014-07-19 NOTE — Telephone Encounter (Signed)
Rx refill sent to patient pharmacy   

## 2015-04-12 ENCOUNTER — Telehealth: Payer: Self-pay

## 2015-04-12 MED ORDER — VERAPAMIL HCL ER 240 MG PO CP24: 240.0000 mg | ORAL_CAPSULE | Freq: Every day | ORAL | Status: DC

## 2015-04-12 NOTE — Telephone Encounter (Signed)
Rx(s) sent to pharmacy electronically.  

## 2015-04-25 ENCOUNTER — Other Ambulatory Visit: Payer: Self-pay | Admitting: Cardiovascular Disease

## 2015-04-25 ENCOUNTER — Other Ambulatory Visit: Payer: Self-pay | Admitting: *Deleted

## 2015-04-25 NOTE — Telephone Encounter (Signed)
Rx(s) sent to pharmacy electronically. Patient due for MD visit April 2017

## 2015-05-31 DIAGNOSIS — I509 Heart failure, unspecified: Secondary | ICD-10-CM | POA: Insufficient documentation

## 2015-05-31 DIAGNOSIS — E7800 Pure hypercholesterolemia, unspecified: Secondary | ICD-10-CM | POA: Insufficient documentation

## 2015-05-31 DIAGNOSIS — N183 Chronic kidney disease, stage 3 unspecified: Secondary | ICD-10-CM

## 2015-05-31 DIAGNOSIS — M810 Age-related osteoporosis without current pathological fracture: Secondary | ICD-10-CM

## 2015-05-31 HISTORY — DX: Age-related osteoporosis without current pathological fracture: M81.0

## 2015-07-17 ENCOUNTER — Encounter: Payer: Self-pay | Admitting: Cardiovascular Disease

## 2015-07-17 ENCOUNTER — Ambulatory Visit (INDEPENDENT_AMBULATORY_CARE_PROVIDER_SITE_OTHER): Payer: Medicare Other | Admitting: Cardiovascular Disease

## 2015-07-17 VITALS — BP 166/86 | HR 59 | Ht 66.0 in | Wt 246.0 lb

## 2015-07-17 DIAGNOSIS — R600 Localized edema: Secondary | ICD-10-CM

## 2015-07-17 DIAGNOSIS — Z9989 Dependence on other enabling machines and devices: Secondary | ICD-10-CM

## 2015-07-17 DIAGNOSIS — G4733 Obstructive sleep apnea (adult) (pediatric): Secondary | ICD-10-CM | POA: Diagnosis not present

## 2015-07-17 DIAGNOSIS — I10 Essential (primary) hypertension: Secondary | ICD-10-CM

## 2015-07-17 DIAGNOSIS — N289 Disorder of kidney and ureter, unspecified: Secondary | ICD-10-CM

## 2015-07-17 DIAGNOSIS — E039 Hypothyroidism, unspecified: Secondary | ICD-10-CM

## 2015-07-17 NOTE — Patient Instructions (Signed)
Your physician wants you to follow-up in: 6 MONTHS OR SOONER IF NEEDED. You will receive a reminder letter in the mail two months in advance. If you don't receive a letter, please call our office to schedule the follow-up appointment.   If you need a refill on your cardiac medications before your next appointment, please call your pharmacy.   

## 2015-07-18 ENCOUNTER — Encounter: Payer: Self-pay | Admitting: Cardiovascular Disease

## 2015-07-18 NOTE — Progress Notes (Signed)
Patient ID: Diana [name], female   DOB: [birth year], 82 y.o.   MRN: [id number]    Primary M.D.: Stephens Shire, M.D. ] HPI: Diana [name] is a 82 y.o. female presents to the office for one year cardiology evaluation.  Diana [name] has a history of hypertension, hyperlipidemia, obesity, type 2 diabetes mellitus, hypothyroidism, renal insufficiency, as well as obstructive sleep apnea on CPAP therapy.  In 2009  a nuclear perfusion study showed diaphragmatic attenuation but was without ischemia. An echo Doppler study in 2011 showed normal systolic function with grade 1 diastolic dysfunction. She had mild to moderate LA dilatation, mild/moderate annular calcification with mild MR, mild TR, and also had moderately sclerotic aortic valve with peak and mean gradients of 15 and 7 mm, respectively.  Since I saw her one year ago, she underwent a four-year follow-up echo Doppler study in May 2015.  This revealed an ejection fraction of 55-60%.  Left reticular diastolic parameters were normal.  There was trivial aortic insufficiency with mild aortic valve sclerosis without stenosis.  The peak gradient was 13 mm.  Over the past year, she states she has done fairly well.  She denies recurrent episodes of chest pain or significant shortness of breath.  She walks with a cane.  At times she experiences some mild ankle swelling.  She sees Dr.[place] for her renal insufficiency.  She is been using her CPAP with 100% compliance.  She has bilateral bone-on-bone of her knees.  She has experienced some occasional vertigo.  She has been gradually reducing her furosemide and now only takes this as an as-needed basis.  She has a history of hypothyroidism on Synthroid replacement.  She has been on simvastatin for hyperlipidemia.  She is diabetic on insulin and Amaryl.  She tells me blood work was recently done by Dr. Tollie Pizza.  These are not available for my review.   Past Medical History  Diagnosis Date  .  Osteopenia   . Cataract   . Obesity   . Hypertension   . Type 2 diabetes mellitus ([place])   . Hyperlipidemia   . Anemia   . Anemia of decreased vitamin B12 absorption 05/26/2005  . Renal insufficiency   . Hx of colonic polyps   . Breast cancer ([place]) 2001  . Hypothyroidism   . OSA on CPAP 09/2007    AHI 10.6/hr overall, 43.64/hr during REM  . History of tobacco abuse     Past Surgical History  Procedure Laterality Date  . Mastectomy Bilateral 2001    bilateral sentinel lymph nodes bio  . Back surgery  05/30/2009  . Cataract extraction Bilateral     bilateral  . Cholecystectomy  1974  . Bowel resection  1974  . Total abdominal hysterectomy  1975  . Transthoracic echocardiogram  02/2010    HE=>17%, stage 1 diastolic dysfunction; borderline RV enlargement; LA mild-mod dilated; mild mitral annular calcif & mild MR; mild TR with normal RSVP, AV moderately sclerotics  . Nm myocar perf wall motion  06/2007    dipyridamole; perfusion defect in inferior myocardium consistent with diaphragmatic attenuation, remaining myocardium with no ischemia/infarct, EF 73%; normal, low risk scan     Allergies  Allergen Reactions  . Codeine Sulfate Nausea Only    Current Outpatient Prescriptions  Medication Sig Dispense Refill  . aspirin 81 MG tablet Take 81 mg by mouth daily.     . calcium-vitamin D (OSCAL WITH D) 500-200 MG-UNIT per tablet Take 2 tablets by mouth daily.      Marland Kitchen  cholecalciferol (VITAMIN D) 1000 UNITS tablet Take 1,000 Units by mouth daily. 2 tabs    . co-enzyme Q-10 30 MG capsule Take 30 mg by mouth 3 (three) times daily.      . COMBIGAN 0.2-0.5 % ophthalmic solution     . Cyanocobalamin (VITAMIN B 12 PO) Take by mouth.      . fish oil-omega-3 fatty acids 1000 MG capsule Take 1 g by mouth daily.     Marland Kitchen FLUoxetine (PROZAC) 20 MG capsule Take 20 mg by mouth.      . furosemide (LASIX) 40 MG tablet Take 40 mg by mouth daily.     Marland Kitchen glimepiride (AMARYL) 1 MG tablet Take 1 mg by mouth 2  (two) times daily before a meal.      . GLUCOSAMINE-CHONDROITIN PO Take 1 capsule by mouth daily. 1500/1200    . insulin glargine (LANTUS) 100 UNIT/ML injection Inject into the skin at bedtime. Sliding scale    . levothyroxine (SYNTHROID, LEVOTHROID) 50 MCG tablet Take 50 mcg by mouth daily.      Marland Kitchen losartan (COZAAR) 50 MG tablet TAKE 1 TABLET (50 MG TOTAL) BY MOUTH DAILY. 30 tablet 2  . magnesium oxide (MAG-OX) 400 MG tablet Take 400 mg by mouth every other day.      . Multiple Vitamins-Minerals (ICAPS) CAPS Take 1 capsule by mouth daily.    . multivitamin (THERAGRAN) per tablet Take 1 tablet by mouth daily.      . NON FORMULARY CPAP    . OVER THE COUNTER MEDICATION Eye caps- vitamins twice daily    . potassium chloride (KLOR-CON) 10 MEQ CR tablet Take 20 mEq by mouth every other day.     . Probiotic Product (PROBIOTIC DAILY PO) Take 1 capsule by mouth daily.    . simvastatin (ZOCOR) 20 MG tablet Take 20 mg by mouth at bedtime.      . verapamil (VERELAN PM) 240 MG 24 hr capsule Take 1 capsule (240 mg total) by mouth at bedtime. 30 capsule 2   No current facility-administered medications for this visit.    Social History   Social History  . Marital Status: Married    Spouse Name: N/A  . Number of Children: N/A  . Years of Education: N/A   Occupational History  . Not on file.   Social History Main Topics  . Smoking status: Former Smoker -- 13 years    Types: Cigarettes    Quit date: 05/02/2002  . Smokeless tobacco: Not on file  . Alcohol Use: No  . Drug Use: Not on file  . Sexual Activity: Not on file   Other Topics Concern  . Not on file   Social History Narrative   Socially she lives by herself. She is widowed for 5 years. 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 She has a BA degree from college.  Family History  Problem Relation Age of Onset  . Colon cancer Neg Hx   . Heart attack Mother   . Heart attack Father      ROS General: Negative; No fevers, chills, or night sweats;  HEENT: Negative; No changes in vision or hearing, sinus congestion, difficulty swallowing Pulmonary: Negative; No cough, wheezing, shortness of breath, hemoptysis Cardiovascular: Negative; No chest pain, presyncope, syncope, palpitations GI: Negative; No nausea, vomiting, diarrhea, or abdominal pain GU: Negative; No dysuria, hematuria, or difficulty voiding Musculoskeletal: Positive for arthritis.  Bone-on-bone in her knees.  She walks  with a cane. Hematologic/Oncology: Negative; no easy bruising, bleeding Endocrine: Positive for diabetes mellitus and hypothyroidism. Neuro: Negative; no changes in balance, headaches Skin: Negative; No rashes or skin lesions Psychiatric: Negative; No behavioral problems, depression Sleep: Positive for obstructive sleep apnea on CPAP therapy.  No snoring, daytime sleepiness, hypersomnolence, bruxism, restless legs, hypnogognic hallucinations, no cataplexy Other comprehensive 14 point system review is negative.   PE BP 166/86 mmHg  Pulse 59  Ht 5' 6"  (1.676 m)  Wt 246 lb (111.585 kg)  BMI 39.72 kg/m2   Repeat blood pressure by me was 148/88.  Wt Readings from Last 3 Encounters:  07/17/15 246 lb (111.585 kg)  06/20/14 247 lb 1.6 oz (112.084 kg)  07/29/13 253 lb (114.76 kg)   General: Alert, oriented, no distress.  Skin: normal turgor, no rashes HEENT: Normocephalic, atraumatic. Pupils round and reactive; sclera anicteric;no lid lag. Extraocular muscles intact; no xanthelasmas. Nose without nasal septal hypertrophy Mouth/Parynx benign; Mallinpatti scale 3 Neck: No JVD, no carotid bruits; normal carotid upstroke Lungs: clear to ausculatation and percussion; no wheezing or rales Chest wall: no tenderness to palpitation Heart: RRR, s1 s2 normal; 2/6 systolic murmur in the aortic region no diastolic murmur, rub thrills or heaves Abdomen: soft, nontender; no hepatosplenomehaly, BS+;  abdominal aorta nontender and not dilated by palpation. Back: no CVA tenderness Pulses 2+ Extremities: Trace ankle swelling;  no clubbing cyanosis, Homan's sign negative  Neurologic: grossly nonfocal; cranial nerves grossly normal. Psychologic: normal affect and mood.  ECG (independently read by me): Sinus bradycardia 59 bpm.  PR interval 190 ms.  QTc interval 449 ms.  No ST segment changes.  ECG  (independently read by me): Normal sinus rhythm at 82 bpm.  No ectopy.  QTc interval 453 ms.  ECG (independently read by me): Normal sinus rhythm at 68 beats per minute with atrial premature complex. No significant ST-T changes.  LABS:  BMP Latest Ref Rng 03/18/2011 07/12/2010 01/22/2010  Glucose 70 - 99 mg/dL 281(H) 231(H) 240(H)  BUN 6 - 23 mg/dL 27(H) 24(H) 23  Creatinine 0.50 - 1.10 mg/dL 1.70(H) 1.62(H) 1.59(H)  Sodium 135 - 145 mEq/L 139 136 138  Potassium 3.5 - 5.3 mEq/L 3.7 3.8 3.9  Chloride 96 - 112 mEq/L 102 97 101  CO2 19 - 32 mEq/L 26 29 27   Calcium 8.4 - 10.5 mg/dL 8.4 8.9 9.0    Hepatic Function Latest Ref Rng 03/18/2011 07/12/2010 01/22/2010  Total Protein 6.0 - 8.3 g/dL 6.3 6.9 6.8  Albumin 3.5 - 5.2 g/dL 3.7 4.1 4.0  AST 0 - 37 U/L 17 19 20   ALT 0 - 35 U/L 14 16 17   Alk Phosphatase 39 - 117 U/L 73 75 95  Total Bilirubin 0.3 - 1.2 mg/dL 0.7 0.7 0.5    CBC Latest Ref Rng 03/18/2011 07/12/2010 01/22/2010  WBC 3.9 - 10.3 10e3/uL 9.0 6.0 5.9  Hemoglobin 11.6 - 15.9 g/dL 12.1 12.8 12.7  Hematocrit 34.8 - 46.6 % 35.8 37.2 37.7  Platelets 145 - 400 10e3/uL 195 219 249   Lab Results  Component Value Date   MCV 91.3 03/18/2011   MCV 90.9 07/12/2010   MCV 90.8 01/22/2010     Lab Results  Component Value Date   HGBA1C * 07/05/2009    6.1 (NOTE)  According to the ADA Clinical Practice Recommendations for 2011, when HbA1c is used as a screening test:   >=6.5%   Diagnostic of Diabetes Mellitus           (if  abnormal result  is confirmed)  5.7-6.4%   Increased risk of developing Diabetes Mellitus  References:Diagnosis and Classification of Diabetes Mellitus,Diabetes CVEL,3810,17(PZWCH 1):S62-S69 and Standards of Medical Care in         Diabetes - 2011,Diabetes ENID,7824,23  (Suppl 1):S11-S61.   BNP    Component Value Date/Time   PROBNP 202.0* 07/05/2009 1005    Lipid Panel     Component Value Date/Time   CHOL  07/05/2009 1005    115        ATP III CLASSIFICATION:  <200     mg/dL   Desirable  200-239  mg/dL   Borderline High  >=240    mg/dL   High          TRIG 108 07/05/2009 1005   HDL 42 07/05/2009 1005   CHOLHDL 2.7 07/05/2009 1005   VLDL 22 07/05/2009 1005   LDLCALC  07/05/2009 1005    51        Total Cholesterol/HDL:CHD Risk Coronary Heart Disease Risk Table                     Men   Women  1/2 Average Risk   3.4   3.3  Average Risk       5.0   4.4  2 X Average Risk   9.6   7.1  3 X Average Risk  23.4   11.0        Use the calculated Patient Ratio above and the CHD Risk Table to determine the patient's CHD Risk.        ATP III CLASSIFICATION (LDL):  <100     mg/dL   Optimal  100-129  mg/dL   Near or Above                    Optimal  130-159  mg/dL   Borderline  160-189  mg/dL   High  >190     mg/dL   Very High     RADIOLOGY: No results found.    ASSESSMENT AND PLAN: Diana [name] is a 82 year old female who will be turning 82 years old in 4 days.  She has a  history of hypertension, hyperlipidemia, obesity, type 2 diabetes mellitus, hypothyroidism, renal insufficiency, as well as obstructive sleep apnea.  She admits to 100% compliance with CPAP therapy and denies residual snoring or daytime sleepiness.  Recently, she has been on losartan 50 mg daily, verapamil 240 mg at bedtime, Her blood pressure today is elevated and on repeat by me was 148/88.  She has stopped her furosemide due to concerns of her kidneys and drying herself out.  I do not have the  results of her most recent blood work done by Dr. Tollie Pizza.  If her renal function is stable.  It may be possible to slightly titrate her losartan to 75 mg but I will not do this.  Depending upon her renal function.  She continues to have trace ankle edema and has been taking furosemide approximate once or perhaps twice a week.  She continues to be on simvastatin 20 mg for hyperlipidemia.  She denies myalgias.  I do not know the results of her recent lipids.  She is diabetic on insulin.  She tells me  she will be seeing her kidney doctor in the near future.  Presently, I will continue her current medications as prescribed, but she will continue to monitor her blood pressure at home, which he states has been stable in the 355E to 174J systolically.  Her last echo has shown normal systolic function with evidence for aortic sclerosis.She is obese and is borderline morbid obesity with a body mass index of 39.7.  Weight reduction was strongly recommended.  I will see her in 6 months for reevaluation or sooner if problems arise.  Time spent: 25 minutes   Troy Sine, MD, [facility]  07/18/2015 4:51 PM

## 2015-08-05 ENCOUNTER — Other Ambulatory Visit: Payer: Self-pay | Admitting: Cardiovascular Disease

## 2015-08-07 NOTE — Telephone Encounter (Signed)
Rx request sent to pharmacy.  

## 2015-10-09 ENCOUNTER — Other Ambulatory Visit: Payer: Self-pay

## 2015-10-09 MED ORDER — SIMVASTATIN 20 MG PO TABS: 20.0000 mg | ORAL_TABLET | Freq: Every day | ORAL | 1 refills | Status: DC

## 2015-10-09 MED ORDER — VERAPAMIL HCL ER 240 MG PO CP24
240.0000 mg | ORAL_CAPSULE | Freq: Every day | ORAL | 1 refills | Status: DC
Start: 2015-10-09 — End: 2016-06-13

## 2015-10-09 MED ORDER — LOSARTAN POTASSIUM 50 MG PO TABS: ORAL_TABLET | ORAL | 6 refills | Status: DC

## 2015-10-09 MED ORDER — LOSARTAN POTASSIUM 50 MG PO TABS: ORAL_TABLET | ORAL | 1 refills | Status: DC

## 2016-01-30 DIAGNOSIS — H8110 Benign paroxysmal vertigo, unspecified ear: Secondary | ICD-10-CM | POA: Insufficient documentation

## 2016-01-30 DIAGNOSIS — K5900 Constipation, unspecified: Secondary | ICD-10-CM | POA: Insufficient documentation

## 2016-06-09 ENCOUNTER — Telehealth: Payer: Self-pay | Admitting: *Deleted

## 2016-06-09 NOTE — Telephone Encounter (Signed)
Fax received from [facility]  For purpose of CPAP supply order approval  Reviewed and signed by provider Dr. Claiborne Billings on 06/09/16  Faxed back to requesting party on 06/09/16

## 2016-06-13 ENCOUNTER — Other Ambulatory Visit: Payer: Self-pay | Admitting: *Deleted

## 2016-06-13 MED ORDER — VERAPAMIL HCL ER 240 MG PO CP24: 240.0000 mg | ORAL_CAPSULE | Freq: Every day | ORAL | 0 refills | Status: DC

## 2016-07-27 ENCOUNTER — Other Ambulatory Visit: Payer: Self-pay | Admitting: Cardiovascular Disease

## 2016-07-28 NOTE — Telephone Encounter (Signed)
Rx(s) sent to pharmacy electronically.  

## 2016-09-04 ENCOUNTER — Encounter: Payer: Self-pay | Admitting: Cardiovascular Disease

## 2016-09-04 ENCOUNTER — Ambulatory Visit (INDEPENDENT_AMBULATORY_CARE_PROVIDER_SITE_OTHER): Payer: Medicare Other | Admitting: Cardiovascular Disease

## 2016-09-04 VITALS — BP 160/84 | HR 61 | Ht 66.0 in | Wt 237.0 lb

## 2016-09-04 DIAGNOSIS — E118 Type 2 diabetes mellitus with unspecified complications: Secondary | ICD-10-CM | POA: Diagnosis not present

## 2016-09-04 DIAGNOSIS — Z794 Long term (current) use of insulin: Secondary | ICD-10-CM

## 2016-09-04 DIAGNOSIS — G4733 Obstructive sleep apnea (adult) (pediatric): Secondary | ICD-10-CM

## 2016-09-04 DIAGNOSIS — I10 Essential (primary) hypertension: Secondary | ICD-10-CM

## 2016-09-04 DIAGNOSIS — Z9989 Dependence on other enabling machines and devices: Secondary | ICD-10-CM

## 2016-09-04 DIAGNOSIS — N289 Disorder of kidney and ureter, unspecified: Secondary | ICD-10-CM

## 2016-09-04 NOTE — Patient Instructions (Signed)
Your physician recommends that you schedule a follow-up appointment pending new CPAP machine.

## 2016-09-06 NOTE — Progress Notes (Signed)
Patient ID: Diana [name], female   DOB: 01/30/1934, 83 y.o.   MRN: [id number]    Primary M.D.: Stephens Shire, M.D. ] HPI: Diana [name] is a 83 y.o. female presents to the office for one year cardiology/sleep clinic evaluation.  Diana [name] has a history of hypertension, hyperlipidemia, obesity, type 2 diabetes mellitus, hypothyroidism, renal insufficiency, as well as obstructive sleep apnea on CPAP therapy.  In 2009  a nuclear perfusion study showed diaphragmatic attenuation but was without ischemia. An echo Doppler study in 2011 showed normal systolic function with grade 1 diastolic dysfunction. She had mild to moderate LA dilatation, mild/moderate annular calcification with mild MR, mild TR, and also had moderately sclerotic aortic valve with peak and mean gradients of 15 and 7 mm, respectively.  Since I saw her one year ago, she underwent a four-year follow-up echo Doppler study in May 2015.  This revealed an ejection fraction of 55-60%.  Left reticular diastolic parameters were normal.  There was trivial aortic insufficiency with mild aortic valve sclerosis without stenosis.  The peak gradient was 13 mm.  Since I last saw her in May 2017 she denies recurrent episodes of chest pain or significant shortness of breath.  She walks with a cane.  At times she experiences some mild ankle swelling.  She sees Dr.[place] for her renal insufficiency.  She has bilateral bone-on-bone of her knees.  She has experienced occasional positional vertigo and was just given a prescription for meclizine.  She has a history of hypothyroidism on Synthroid replacement.  She has been on simvastatin for hyperlipidemia.  She is diabetic on insulin.   Her initial sleep study was in May 2009 which showed an overall AHI of 10.6, but during REM sleep AHI was 64 per hour.  She continues to use CPAP recently but because of her vertigo issues she has used it very much over the past month.  She has an old 2009  Respironics device and is eligible for a new CPAP device.  At her last download her pressure was 13 cm.  Her machine is old and has a large card which we were unable to obtain a new download today in the office.  When her machine was interrogated, she has only used her device one time over the past 7 days and 13 times over the past 30 days. AHI is 1.9/h and her 30 day AHI is 3.7 per hour.  She has a large leak at 46.9.  Epworth scale was recalculated in the office today and this endorsed at 6, arguing against significant residual daytime sleepiness with the highest chance of dozing while lying down to rest in the afternoon when circumstances per minute and only is very slight chance of dozing while sitting and reading, watching television, was a passenger in a car for an hour without a break.  She presents for evaluation.  Past Medical History:  Diagnosis Date  . Anemia   . Anemia of decreased vitamin B12 absorption 05/26/2005  . Breast cancer ([place]) 2001  . Cataract   . History of tobacco abuse   . Hx of colonic polyps   . Hyperlipidemia   . Hypertension   . Hypothyroidism   . Obesity   . OSA on CPAP 09/2007   AHI 10.6/hr overall, 43.64/hr during REM  . Osteopenia   . Renal insufficiency   . Type 2 diabetes mellitus ([place])     Past Surgical History:  Procedure Laterality Date  . BACK SURGERY  05/30/2009  . BOWEL RESECTION  1974  . CATARACT EXTRACTION Bilateral    bilateral  . CHOLECYSTECTOMY  1974  . MASTECTOMY Bilateral 2001   bilateral sentinel lymph nodes bio  . NM MYOCAR PERF WALL MOTION  06/2007   dipyridamole; perfusion defect in inferior myocardium consistent with diaphragmatic attenuation, remaining myocardium with no ischemia/infarct, EF 73%; normal, low risk scan   . TOTAL ABDOMINAL HYSTERECTOMY  1975  . TRANSTHORACIC ECHOCARDIOGRAM  02/2010   BP=>10%, stage 1 diastolic dysfunction; borderline RV enlargement; LA mild-mod dilated; mild mitral annular calcif & mild MR; mild TR  with normal RSVP, AV moderately sclerotics    Allergies  Allergen Reactions  . Codeine Sulfate Nausea Only    Current Outpatient Prescriptions  Medication Sig Dispense Refill  . aspirin 81 MG tablet Take 162 mg by mouth daily.     . calcium-vitamin D (OSCAL WITH D) 500-200 MG-UNIT per tablet Take 2 tablets by mouth.     . cholecalciferol (VITAMIN D) 1000 UNITS tablet Take 1,000 Units by mouth. 2 tabs    . co-enzyme Q-10 30 MG capsule Take 30 mg by mouth.     . COMBIGAN 0.2-0.5 % ophthalmic solution Place 1 drop into both eyes every 12 (twelve) hours.     . Cyanocobalamin (VITAMIN B 12 PO) Take 1 tablet by mouth daily.     . fish oil-omega-3 fatty acids 1000 MG capsule Take 1 g by mouth daily.     Marland Kitchen FLUoxetine (PROZAC) 20 MG capsule Take 20 mg by mouth daily.     Marland Kitchen FLUoxetine (PROZAC) 20 MG capsule Take 1 capsule by mouth daily.    . furosemide (LASIX) 40 MG tablet Take 40 mg by mouth as needed for edema.     Marland Kitchen glimepiride (AMARYL) 1 MG tablet Take 1 mg by mouth 2 (two) times daily before a meal.      . GLUCOSAMINE-CHONDROITIN PO Take 1 capsule by mouth. 1500/1200     . insulin glargine (LANTUS) 100 UNIT/ML injection Inject 43 Units into the skin at bedtime. Sliding scale    . levothyroxine (SYNTHROID, LEVOTHROID) 50 MCG tablet Take 50 mcg by mouth daily.      Marland Kitchen losartan (COZAAR) 50 MG tablet TAKE 1 TABLET (50 MG TOTAL) BY MOUTH DAILY. 90 tablet 2  . magnesium oxide (MAG-OX) 400 MG tablet Take 400 mg by mouth.     . meclizine (ANTIVERT) 25 MG tablet Take 25 mg by mouth.    . Multiple Vitamins-Minerals (PRESERVISION AREDS 2) CAPS Take 2 capsules by mouth daily.    . multivitamin (THERAGRAN) per tablet Take 1 tablet by mouth daily.      . NON FORMULARY CPAP    . potassium chloride (KLOR-CON) 10 MEQ CR tablet Take 20 mEq by mouth.     . Probiotic Product (PROBIOTIC DAILY PO) Take 1 capsule by mouth daily.    . simvastatin (ZOCOR) 20 MG tablet Take 1 tablet (20 mg total) by mouth at  bedtime. 90 tablet 1  . verapamil (VERELAN PM) 240 MG 24 hr capsule Take 1 capsule (240 mg total) by mouth at bedtime. 90 capsule 0   No current facility-administered medications for this visit.     Social History   Social History  . Marital status: Married    Spouse name: N/A  . Number of children: N/A  . Years of education: N/A   Occupational History  . Not on file.   Social History Main Topics  . Smoking  status: Former Smoker    Years: 13.00    Types: Cigarettes    Quit date: 05/02/2002  . Smokeless tobacco: Never Used  . Alcohol use No  . Drug use: Unknown  . Sexual activity: Not on file   Other Topics Concern  . Not on file   Social History Narrative  . No narrative on file   Socially she lives by herself. She is widowed for 5 years. 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 She has a BA degree from college.  Family History  Problem Relation Age of Onset  . Colon cancer Neg Hx   . Heart attack Mother   . Heart attack Father    ROS General: Negative; No fevers, chills, or night sweats;  HEENT: Negative; No changes in vision or hearing, sinus congestion, difficulty swallowing Pulmonary: Negative; No cough, wheezing, shortness of breath, hemoptysis Cardiovascular: Negative; No chest pain, presyncope, syncope, palpitations GI: Negative; No nausea, vomiting, diarrhea, or abdominal pain GU: Negative; No dysuria, hematuria, or difficulty voiding Musculoskeletal: Positive for arthritis.  Bone-on-bone in her knees.  She walks with a cane. Hematologic/Oncology: Negative; no easy bruising, bleeding Endocrine: Positive for diabetes mellitus and hypothyroidism. Neuro: Negative; no changes in balance, headaches Skin: Negative; No rashes or skin lesions Psychiatric: Negative; No behavioral problems, depression Sleep: Positive for obstructive sleep apnea on CPAP therapy.  No snoring, daytime sleepiness, hypersomnolence,  bruxism, restless legs, hypnogognic hallucinations, no cataplexy Other comprehensive 14 point system review is negative.   PE BP (!) 160/84 (BP Location: Left Arm, Patient Position: Sitting, Cuff Size: Large)   Pulse 61   Ht 5' 6"  (1.676 m)   Wt 237 lb (107.5 kg)   SpO2 100%   BMI 38.25 kg/m    Repeat blood pressure by me was 140/88.  Wt Readings from Last 3 Encounters:  09/04/16 237 lb (107.5 kg)  07/17/15 246 lb (111.6 kg)  06/20/14 247 lb 1.6 oz (112.1 kg)   General: Alert, oriented, no distress.  Skin: normal turgor, no rashes, warm and dry HEENT: Normocephalic, atraumatic. Pupils equal round and reactive to light; sclera anicteric; extraocular muscles intact;  Nose without nasal septal hypertrophy Mouth/Parynx benign; Mallinpatti scale 3 Neck: No JVD, no carotid bruits; normal carotid upstroke Lungs: clear to ausculatation and percussion; no wheezing or rales Chest wall: without tenderness to palpitation Heart: PMI not displaced, RRR, s1 s2 normal, 2/6 systolic murmur in the aortic region, no diastolic murmur, no rubs, gallops, thrills, or heaves Abdomen: soft, nontender; no hepatosplenomehaly, BS+; abdominal aorta nontender and not dilated by palpation. Back: no CVA tenderness Pulses 2+ Musculoskeletal: full range of motion, normal strength, no joint deformities Extremities: Trivial edema no clubbing cyanosis, Homan's sign negative  Neurologic: grossly nonfocal; Cranial nerves grossly wnl Psychologic: Normal mood and affect   07/17/2015 ECG (independently read by me): Sinus bradycardia 59 bpm.  PR interval 190 ms.  QTc interval 449 ms.  No ST segment changes.  April 2016ECG  (independently read by me): Normal sinus rhythm at 82 bpm.  No ectopy.  QTc interval 453 ms.  March 2014 ECG (independently read by me): Normal sinus rhythm at 68 beats per minute with atrial premature complex. No significant ST-T changes.  LABS:  BMP Latest Ref Rng & Units 03/18/2011 07/12/2010  01/22/2010  Glucose 70 - 99 mg/dL 281(H) 231(H) 240(H)  BUN 6 - 23 mg/dL 27(H) 24(H) 23  Creatinine 0.50 - 1.10 mg/dL 1.70(H) 1.62(H) 1.59(H)  Sodium 135 - 145 mEq/L 139 136 138  Potassium 3.5 - 5.3 mEq/L 3.7 3.8 3.9  Chloride 96 - 112 mEq/L 102 97 101  CO2 19 - 32 mEq/L 26 29 27   Calcium 8.4 - 10.5 mg/dL 8.4 8.9 9.0    Hepatic Function Latest Ref Rng & Units 03/18/2011 07/12/2010 01/22/2010  Total Protein 6.0 - 8.3 g/dL 6.3 6.9 6.8  Albumin 3.5 - 5.2 g/dL 3.7 4.1 4.0  AST 0 - 37 U/L 17 19 20   ALT 0 - 35 U/L 14 16 17   Alk Phosphatase 39 - 117 U/L 73 75 95  Total Bilirubin 0.3 - 1.2 mg/dL 0.7 0.7 0.5    CBC Latest Ref Rng & Units 03/18/2011 07/12/2010 01/22/2010  WBC 3.9 - 10.3 10e3/uL 9.0 6.0 5.9  Hemoglobin 11.6 - 15.9 g/dL 12.1 12.8 12.7  Hematocrit 34.8 - 46.6 % 35.8 37.2 37.7  Platelets 145 - 400 10e3/uL 195 219 249   Lab Results  Component Value Date   MCV 91.3 03/18/2011   MCV 90.9 07/12/2010   MCV 90.8 01/22/2010     Lab Results  Component Value Date   HGBA1C (H) 07/05/2009    6.1 (NOTE)                                                                       According to the ADA Clinical Practice Recommendations for 2011, when HbA1c is used as a screening test:   >=6.5%   Diagnostic of Diabetes Mellitus           (if abnormal result  is confirmed)  5.7-6.4%   Increased risk of developing Diabetes Mellitus  References:Diagnosis and Classification of Diabetes Mellitus,Diabetes FVCB,4496,75(FFMBW 1):S62-S69 and Standards of Medical Care in         Diabetes - 2011,Diabetes Care,2011,34  (Suppl 1):S11-S61.   BNP    Component Value Date/Time   PROBNP 202.0 (H) 07/05/2009 1005    Lipid Panel     Component Value Date/Time   CHOL  07/05/2009 1005    115        ATP III CLASSIFICATION:  <200     mg/dL   Desirable  200-239  mg/dL   Borderline High  >=240    mg/dL   High          TRIG 108 07/05/2009 1005   HDL 42 07/05/2009 1005   CHOLHDL 2.7 07/05/2009 1005   VLDL 22  07/05/2009 1005   LDLCALC  07/05/2009 1005    51        Total Cholesterol/HDL:CHD Risk Coronary Heart Disease Risk Table                     Men   Women  1/2 Average Risk   3.4   3.3  Average Risk       5.0   4.4  2 X Average Risk   9.6   7.1  3 X Average Risk  23.4   11.0        Use the calculated Patient Ratio above and the CHD Risk Table to determine the patient's CHD Risk.        ATP III CLASSIFICATION (LDL):  <100     mg/dL  Optimal  100-129  mg/dL   Near or Above                    Optimal  130-159  mg/dL   Borderline  160-189  mg/dL   High  >190     mg/dL   Very High     RADIOLOGY: No results found.  IMPRESSION:  1. OSA on CPAP   2. Essential hypertension, benign   3. Renal insufficiency   4. Morbid obesity, unspecified obesity type ([place])   5. Type 2 diabetes mellitus with complication, with long-term current use of insulin ([place])     ASSESSMENT AND PLAN: Diana [name] is a 83 year old female who has a  history of hypertension, hyperlipidemia, obesity, type 2 diabetes mellitus, hypothyroidism, renal insufficiency, as well as obstructive sleep apnea. Her sleep apnea was severe during REM sleep with an AHI of 64, although overall AHI was 10.62, but RDI was 54 per hour.  She previously was using CPAP with 100% compliance, but interrogation of her machine today reveals that over the past month.  Her use has declined and she states this is due to her positional vertigo.  She was just given a prescription for meclizine, which she has not yet started.  She qualifies for a new machine and would benefit from a ResMed air sent stent auto unit set at 10-20.  However, we may need to document prior improve compliance and if she cannot obtain a new machine.  A new sleep study may be necessary.  [facility] is her DME company and we will contact them to see if she qualifies for new machine without a subsequent sleep study.  I reviewed the records from [facility]  regarding her renal insufficiency.  Her blood pressure today initially was elevated.  There is no edema, and I have suggested that instead of taking furosemide 40 mg as needed for edema to try taking 20 mg every third day.  She continues to be on losartan and depending upon renal function dose adjustment may be needed, but I will defer this to her nephrologist.  She is diabetic on insulin and has morbid obesity.  Weight loss was recommended.  When she gets a new CPAP machine, I will need to see her back in a sleep clinic within 90 days per Medicare compliance guidelines.   Time spent: 25 minutes   Troy Sine, MD, [facility]  09/06/2016 3:17 PM

## 2016-10-26 ENCOUNTER — Other Ambulatory Visit: Payer: Self-pay | Admitting: Cardiovascular Disease

## 2016-10-27 NOTE — Telephone Encounter (Signed)
Rx(s) sent to pharmacy electronically.  

## 2016-11-15 ENCOUNTER — Other Ambulatory Visit: Payer: Self-pay | Admitting: Cardiovascular Disease

## 2016-11-19 ENCOUNTER — Other Ambulatory Visit: Payer: Self-pay | Admitting: Cardiovascular Disease

## 2016-12-04 ENCOUNTER — Encounter: Payer: Self-pay | Admitting: Cardiovascular Disease

## 2016-12-04 ENCOUNTER — Ambulatory Visit (INDEPENDENT_AMBULATORY_CARE_PROVIDER_SITE_OTHER): Payer: Medicare Other | Admitting: Cardiovascular Disease

## 2016-12-04 VITALS — BP 182/84 | HR 70 | Ht 66.0 in | Wt 235.0 lb

## 2016-12-04 DIAGNOSIS — Z794 Long term (current) use of insulin: Secondary | ICD-10-CM | POA: Diagnosis not present

## 2016-12-04 DIAGNOSIS — N289 Disorder of kidney and ureter, unspecified: Secondary | ICD-10-CM

## 2016-12-04 DIAGNOSIS — E118 Type 2 diabetes mellitus with unspecified complications: Secondary | ICD-10-CM | POA: Diagnosis not present

## 2016-12-04 DIAGNOSIS — G4733 Obstructive sleep apnea (adult) (pediatric): Secondary | ICD-10-CM | POA: Diagnosis not present

## 2016-12-04 DIAGNOSIS — Z9989 Dependence on other enabling machines and devices: Secondary | ICD-10-CM | POA: Diagnosis not present

## 2016-12-04 MED ORDER — LOSARTAN POTASSIUM 100 MG PO TABS: ORAL_TABLET | ORAL | 3 refills | Status: DC

## 2016-12-04 NOTE — Patient Instructions (Addendum)
Medication Instructions:  INCREASE Losartan to 100mg  daily-a new prescription has been sent to your pharmacy.  Labwork: Please have lab work at your PCP within 2 weeks.  If you do not get labs at PCP please let me know, we will do labs at our office.  Testing/Procedures: NONE  Follow-Up: Your physician wants you to follow-up in: 6 MONTHS with Dr. Claiborne Billings (sleep clinic). You will receive a reminder letter in the mail two months in advance. If you don't receive a letter, please call our office to schedule the follow-up appointment.   Any Other Special Instructions Will Be Listed Below (If Applicable).     If you need a refill on your cardiac medications before your next appointment, please call your pharmacy.

## 2016-12-05 NOTE — Progress Notes (Signed)
Patient ID: Diana [name], female   DOB: September 24, 1933, 83 y.o.   MRN: [id number]    Primary M.D.: Stephens Shire, M.D. ] HPI: Diana [name] is a 83 y.o. female presents to the office for a cardiology/sleep clinic evaluation after receiving a new CPAP machine..  Diana [name] has a history of hypertension, hyperlipidemia, obesity, type 2 diabetes mellitus, hypothyroidism, renal insufficiency, as well as obstructive sleep apnea on CPAP therapy.  In 2009  a nuclear perfusion study showed diaphragmatic attenuation but was without ischemia. An echo Doppler study in 2011 showed normal systolic function with grade 1 diastolic dysfunction. She had mild to moderate LA dilatation, mild/moderate annular calcification with mild MR, mild TR, and also had moderately sclerotic aortic valve with peak and mean gradients of 15 and 7 mm, respectively.  Since I saw her one year ago, she underwent a four-year follow-up echo Doppler study in May 2015.  This revealed an ejection fraction of 55-60%.  Left reticular diastolic parameters were normal.  There was trivial aortic insufficiency with mild aortic valve sclerosis without stenosis.  The peak gradient was 13 mm.  Since I last saw her in May 2017 she denies recurrent episodes of chest pain or significant shortness of breath.  She walks with a cane.  At times she experiences some mild ankle swelling.  She sees Dr.[place] for her renal insufficiency.  She has bilateral bone-on-bone of her knees.  She has experienced occasional positional vertigo and was just given a prescription for meclizine.  She has a history of hypothyroidism on Synthroid replacement.  She has been on simvastatin for hyperlipidemia.  She is diabetic on insulin.   Her initial sleep study in May 2009 showed an overall AHI of 10.6, but during REM sleep AHI was 64 per hour.  She continues to use CPAP recently but because of her vertigo issues she has used it very much over the past month.  She  has an old 2009 Respironics device and is eligible for a new CPAP device.  At her last download her pressure was 13 cm.  Her machine is old and has a large card which we were unable to obtain a new download today in the office.  When her machine was interrogated, she has only used her device one time over the past 7 days and 13 times over the past 30 days. AHI is 1.9/h and her 30 day AHI is 3.7 per hour.  She has a large leak at 46.9.  Epworth scale was recalculated in the office today and this endorsed at 6, arguing against significant residual daytime sleepiness with the highest chance of dozing while lying down to rest in the afternoon when circumstances per minute and only is very slight chance of dozing while sitting and reading, watching television, was a passenger in a car for an hour without a break.  She presents for evaluation.  Since I last saw her, she had a CPAP replacement ResMed air since 10 AutoSet unit with set up date on 09/24/2016.  She has been set at a 13 cm water pressure.  A download from 11/01/2016 through 11/30/2016 shows 100% compliance with both usage and usage greater than 4 hours.  AHI is excellent at 1.5.  She has been using a ResMed air fifth after 20 medium size mask.  She does have a large leak and despite this AHI continues to be excellent.  An Epworth Sleepiness Scale score was recalculated the office today and this endorsed at  4.  She presents for evaluation.  Past Medical History:  Diagnosis Date  . Anemia   . Anemia of decreased vitamin B12 absorption 05/26/2005  . Breast cancer ([place]) 2001  . Cataract   . History of tobacco abuse   . Hx of colonic polyps   . Hyperlipidemia   . Hypertension   . Hypothyroidism   . Obesity   . OSA on CPAP 09/2007   AHI 10.6/hr overall, 43.64/hr during REM  . Osteopenia   . Renal insufficiency   . Type 2 diabetes mellitus ([place])     Past Surgical History:  Procedure Laterality Date  . BACK SURGERY  05/30/2009  . BOWEL RESECTION   1974  . CATARACT EXTRACTION Bilateral    bilateral  . CHOLECYSTECTOMY  1974  . MASTECTOMY Bilateral 2001   bilateral sentinel lymph nodes bio  . NM MYOCAR PERF WALL MOTION  06/2007   dipyridamole; perfusion defect in inferior myocardium consistent with diaphragmatic attenuation, remaining myocardium with no ischemia/infarct, EF 73%; normal, low risk scan   . TOTAL ABDOMINAL HYSTERECTOMY  1975  . TRANSTHORACIC ECHOCARDIOGRAM  02/2010   PO=>24%, stage 1 diastolic dysfunction; borderline RV enlargement; LA mild-mod dilated; mild mitral annular calcif & mild MR; mild TR with normal RSVP, AV moderately sclerotics    Allergies  Allergen Reactions  . Codeine Sulfate Nausea Only    Current Outpatient Prescriptions  Medication Sig Dispense Refill  . aspirin 81 MG tablet Take 162 mg by mouth daily.     . calcium-vitamin D (OSCAL WITH D) 500-200 MG-UNIT per tablet Take 2 tablets by mouth.     . cholecalciferol (VITAMIN D) 1000 UNITS tablet Take 1,000 Units by mouth. 2 tabs    . co-enzyme Q-10 30 MG capsule Take 30 mg by mouth.     . COMBIGAN 0.2-0.5 % ophthalmic solution Place 1 drop into both eyes every 12 (twelve) hours.     . Cyanocobalamin (VITAMIN B 12 PO) Take 1 tablet by mouth daily.     . fish oil-omega-3 fatty acids 1000 MG capsule Take 1 g by mouth daily.     Marland Kitchen FLUoxetine (PROZAC) 20 MG capsule Take 20 mg by mouth daily.     Marland Kitchen FLUoxetine (PROZAC) 20 MG capsule Take 1 capsule by mouth daily.    . furosemide (LASIX) 40 MG tablet Take 40 mg by mouth as needed for edema.     Marland Kitchen glimepiride (AMARYL) 1 MG tablet Take 1 mg by mouth 2 (two) times daily before a meal.      . GLUCOSAMINE-CHONDROITIN PO Take 1 capsule by mouth. 1500/1200     . insulin glargine (LANTUS) 100 UNIT/ML injection Inject 43 Units into the skin at bedtime. Sliding scale    . levothyroxine (SYNTHROID, LEVOTHROID) 50 MCG tablet Take 50 mcg by mouth daily.      Marland Kitchen losartan (COZAAR) 100 MG tablet TAKE 1 TABLET (100 MG TOTAL)  BY MOUTH DAILY. 90 tablet 3  . magnesium oxide (MAG-OX) 400 MG tablet Take 400 mg by mouth.     . meclizine (ANTIVERT) 25 MG tablet Take 25 mg by mouth.    . Multiple Vitamins-Minerals (PRESERVISION AREDS 2) CAPS Take 2 capsules by mouth daily.    . multivitamin (THERAGRAN) per tablet Take 1 tablet by mouth daily.      . NON FORMULARY CPAP    . potassium chloride (KLOR-CON) 10 MEQ CR tablet Take 20 mEq by mouth.     . Probiotic Product (PROBIOTIC DAILY  PO) Take 1 capsule by mouth daily.    . simvastatin (ZOCOR) 20 MG tablet Take 1 tablet (20 mg total) by mouth at bedtime. 90 tablet 1  . verapamil (VERELAN PM) 240 MG 24 hr capsule TAKE ONE CAPSULE BY MOUTH EVERY NIGHT AT BEDTIME * PLEASE CONTACT DOCTOR'S  OFFICE FOR REFILLS 90 capsule 3   No current facility-administered medications for this visit.     Social History   Social History  . Marital status: Married    Spouse name: N/A  . Number of children: N/A  . Years of education: N/A   Occupational History  . Not on file.   Social History Main Topics  . Smoking status: Former Smoker    Years: 13.00    Types: Cigarettes    Quit date: 05/02/2002  . Smokeless tobacco: Never Used  . Alcohol use No  . Drug use: Unknown  . Sexual activity: Not on file   Other Topics Concern  . Not on file   Social History Narrative  . No narrative on file   Socially she lives by herself. She is widowed for 5 years. 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 She has a BA degree from college.  Family History  Problem Relation Age of Onset  . Colon cancer Neg Hx   . Heart attack Mother   . Heart attack Father    ROS General: Negative; No fevers, chills, or night sweats;  HEENT: Negative; No changes in vision or hearing, sinus congestion, difficulty swallowing Pulmonary: Negative; No cough, wheezing, shortness of breath, hemoptysis Cardiovascular: Negative; No chest pain, presyncope, syncope,  palpitations GI: Negative; No nausea, vomiting, diarrhea, or abdominal pain GU: Negative; No dysuria, hematuria, or difficulty voiding Musculoskeletal: Positive for arthritis.  Bone-on-bone in her knees.  She walks with a cane. Hematologic/Oncology: Negative; no easy bruising, bleeding Endocrine: Positive for diabetes mellitus and hypothyroidism. Neuro: Negative; no changes in balance, headaches Skin: Negative; No rashes or skin lesions Psychiatric: Negative; No behavioral problems, depression Sleep: Positive for obstructive sleep apnea on CPAP therapy.  No snoring, daytime sleepiness, hypersomnolence, bruxism, restless legs, hypnogognic hallucinations, no cataplexy Other comprehensive 14 point system review is negative.   PE BP (!) 182/84   Pulse 70   Ht 5' 6"  (1.676 m)   Wt 235 lb (106.6 kg)   BMI 37.93 kg/m    Repeat blood pressure by me was 174/85  Wt Readings from Last 3 Encounters:  12/04/16 235 lb (106.6 kg)  09/04/16 237 lb (107.5 kg)  07/17/15 246 lb (111.6 kg)     Physical Exam BP (!) 182/84   Pulse 70   Ht 5' 6"  (1.676 m)   Wt 235 lb (106.6 kg)   BMI 37.93 kg/m  General: Alert, oriented, no distress.  Skin: normal turgor, no rashes, warm and dry HEENT: Normocephalic, atraumatic. Pupils equal round and reactive to light; sclera anicteric; extraocular muscles intact; Nose without nasal septal hypertrophy Mouth/Parynx benign; Mallinpatti scale 3 Neck: No JVD, no carotid bruits; normal carotid upstroke Lungs: clear to ausculatation and percussion; no wheezing or rales Chest wall: without tenderness to palpitation Heart: PMI not displaced, RRR, s1 s2 normal, 979 systolic murmur, no diastolic murmur, no rubs, gallops, thrills, or heaves Abdomen: soft, nontender; no hepatosplenomehaly, BS+; abdominal aorta nontender and not dilated by palpation. Back: no CVA tenderness Pulses 2+ Musculoskeletal: full range of motion, normal strength, no joint  deformities Extremities: no clubbing cyanosis or edema,  Homan's sign negative  Neurologic: grossly nonfocal; Cranial nerves grossly wnl Psychologic: Normal mood and affect   07/17/2015 ECG (independently read by me): Sinus bradycardia 59 bpm.  PR interval 190 ms.  QTc interval 449 ms.  No ST segment changes.  April 2016ECG  (independently read by me): Normal sinus rhythm at 82 bpm.  No ectopy.  QTc interval 453 ms.  March 2014 ECG (independently read by me): Normal sinus rhythm at 68 beats per minute with atrial premature complex. No significant ST-T changes.  LABS:  BMP Latest Ref Rng & Units 03/18/2011 07/12/2010 01/22/2010  Glucose 70 - 99 mg/dL 281(H) 231(H) 240(H)  BUN 6 - 23 mg/dL 27(H) 24(H) 23  Creatinine 0.50 - 1.10 mg/dL 1.70(H) 1.62(H) 1.59(H)  Sodium 135 - 145 mEq/L 139 136 138  Potassium 3.5 - 5.3 mEq/L 3.7 3.8 3.9  Chloride 96 - 112 mEq/L 102 97 101  CO2 19 - 32 mEq/L 26 29 27   Calcium 8.4 - 10.5 mg/dL 8.4 8.9 9.0    Hepatic Function Latest Ref Rng & Units 03/18/2011 07/12/2010 01/22/2010  Total Protein 6.0 - 8.3 g/dL 6.3 6.9 6.8  Albumin 3.5 - 5.2 g/dL 3.7 4.1 4.0  AST 0 - 37 U/L 17 19 20   ALT 0 - 35 U/L 14 16 17   Alk Phosphatase 39 - 117 U/L 73 75 95  Total Bilirubin 0.3 - 1.2 mg/dL 0.7 0.7 0.5    CBC Latest Ref Rng & Units 03/18/2011 07/12/2010 01/22/2010  WBC 3.9 - 10.3 10e3/uL 9.0 6.0 5.9  Hemoglobin 11.6 - 15.9 g/dL 12.1 12.8 12.7  Hematocrit 34.8 - 46.6 % 35.8 37.2 37.7  Platelets 145 - 400 10e3/uL 195 219 249   Lab Results  Component Value Date   MCV 91.3 03/18/2011   MCV 90.9 07/12/2010   MCV 90.8 01/22/2010     Lab Results  Component Value Date   HGBA1C (H) 07/05/2009    6.1 (NOTE)                                                                       According to the ADA Clinical Practice Recommendations for 2011, when HbA1c is used as a screening test:   >=6.5%   Diagnostic of Diabetes Mellitus           (if abnormal result  is confirmed)  5.7-6.4%    Increased risk of developing Diabetes Mellitus  References:Diagnosis and Classification of Diabetes Mellitus,Diabetes JYNW,2956,21(HYQMV 1):S62-S69 and Standards of Medical Care in         Diabetes - 2011,Diabetes Care,2011,34  (Suppl 1):S11-S61.   BNP    Component Value Date/Time   PROBNP 202.0 (H) 07/05/2009 1005    Lipid Panel     Component Value Date/Time   CHOL  07/05/2009 1005    115        ATP III CLASSIFICATION:  <200     mg/dL   Desirable  200-239  mg/dL   Borderline High  >=240    mg/dL   High          TRIG 108 07/05/2009 1005   HDL 42 07/05/2009 1005   CHOLHDL 2.7 07/05/2009 1005   VLDL 22 07/05/2009 1005   LDLCALC  07/05/2009 1005  51        Total Cholesterol/HDL:CHD Risk Coronary Heart Disease Risk Table                     Men   Women  1/2 Average Risk   3.4   3.3  Average Risk       5.0   4.4  2 X Average Risk   9.6   7.1  3 X Average Risk  23.4   11.0        Use the calculated Patient Ratio above and the CHD Risk Table to determine the patient's CHD Risk.        ATP III CLASSIFICATION (LDL):  <100     mg/dL   Optimal  100-129  mg/dL   Near or Above                    Optimal  130-159  mg/dL   Borderline  160-189  mg/dL   High  >190     mg/dL   Very High     RADIOLOGY: No results found.  IMPRESSION:  1. OSA on CPAP   2. Renal insufficiency   3. Morbid obesity, unspecified obesity type ([place])   4. Type 2 diabetes mellitus with complication, with long-term current use of insulin ([place])     ASSESSMENT AND PLAN: Diana [name] is a 83 year old female who has a  history of hypertension, hyperlipidemia, obesity, type 2 diabetes mellitus, hypothyroidism, renal insufficiency, as well as obstructive sleep apnea. She was found to have severe sleep apnea during REM sleep on her initial sleep study in 200 with an AHI of 64, although overall AHI was 10.62, but RDI was 54 per hour.  Since I last saw her, she received a new ResMed air 10 AutoSet CPAP  unit.  She is set at 13 cm water pressure.  A download today confirms excellent compliance with 100% usage.  She is averaging 9 hours and 24 minutes of sleep per night.  AHI is excellent at 1.5.  She has a leak.  I discussed with her improved cleansing technique of her cushion which seems to be the source of the leak.  Her blood pressure today is elevated at 182/84 and on repeat by me was 174/85.  She has been on losartan 50 mg, furosemide when necessary, in addition to verapamil 20/40 milligrams for hypertension.  I have recommended titration of losartan to 100 mg.  She is diabetic on Clement per minute and insulin and a recent hemoglobin A1c was 6.7.  She has history of hypothyroidism.  He continues to take levothyroxine 50 g.  I have suggested follow-up laboratory.  With her increased losartan several weeks, which will be done at [facility].  I commended her on her CPAP compliance.  She is morbidly obese with her diabetes mellitus BMI is 37.9.  She also has renal insufficiency history and is also followed by nephrology.  I will see her in 6 months for reevaluation.   Diana Sine, MD, [facility]  12/05/2016 5:54 PM

## 2016-12-23 ENCOUNTER — Telehealth: Payer: Self-pay | Admitting: Cardiovascular Disease

## 2016-12-23 NOTE — Telephone Encounter (Signed)
Spoke with pharmacist  

## 2016-12-23 NOTE — Telephone Encounter (Signed)
New message    Pt c/o medication issue:  1. Name of Medication: verapamil   2. How are you currently taking this medication (dosage and times per day)? 240 mg capsule  3. Are you having a reaction (difficulty breathing--STAT)? no  4. What is your medication issue? Medication is on backorder so pharmacy had to switch to tablet. Is this ok?

## 2017-05-08 LAB — HM DIABETES EYE EXAM

## 2017-10-29 ENCOUNTER — Other Ambulatory Visit: Payer: Self-pay | Admitting: Cardiovascular Disease

## 2017-11-05 ENCOUNTER — Other Ambulatory Visit: Payer: Self-pay | Admitting: Cardiovascular Disease

## 2017-11-08 ENCOUNTER — Other Ambulatory Visit: Payer: Self-pay | Admitting: Cardiovascular Disease

## 2017-11-11 ENCOUNTER — Telehealth: Payer: Self-pay | Admitting: Cardiovascular Disease

## 2017-11-11 ENCOUNTER — Other Ambulatory Visit: Payer: Self-pay

## 2017-11-11 MED ORDER — LOSARTAN POTASSIUM 100 MG PO TABS: ORAL_TABLET | ORAL | 0 refills | Status: DC

## 2017-11-11 MED ORDER — LOSARTAN POTASSIUM 100 MG PO TABS: ORAL_TABLET | ORAL | 3 refills | Status: DC

## 2017-11-11 NOTE — Telephone Encounter (Signed)
New Message:    Estill Bamberg with [facility] has questions about the losartan (COZAAR) 100 MG tablet

## 2017-11-11 NOTE — Telephone Encounter (Signed)
Returned call to [place] at [facility] she stated they just received losartan refill.She wanted to know if ok to dispense 90 tablets instead of 45.Advised ok to dispense 90 tablets.

## 2017-12-03 DIAGNOSIS — Z7409 Other reduced mobility: Secondary | ICD-10-CM | POA: Insufficient documentation

## 2017-12-03 DIAGNOSIS — M129 Arthropathy, unspecified: Secondary | ICD-10-CM | POA: Insufficient documentation

## 2017-12-06 ENCOUNTER — Other Ambulatory Visit: Payer: Self-pay

## 2017-12-06 ENCOUNTER — Emergency Department (HOSPITAL_COMMUNITY): Payer: Medicare Other

## 2017-12-06 ENCOUNTER — Encounter (HOSPITAL_COMMUNITY): Payer: Self-pay

## 2017-12-06 ENCOUNTER — Emergency Department (HOSPITAL_COMMUNITY)
Admission: EM | Admit: 2017-12-06 | Discharge: 2017-12-08 | Disposition: A | Discharge status: Home / Self Care | Payer: Medicare Other | Attending: Emergency Medicine | Admitting: Emergency Medicine

## 2017-12-06 DIAGNOSIS — Z87891 Personal history of nicotine dependence: Secondary | ICD-10-CM | POA: Diagnosis not present

## 2017-12-06 DIAGNOSIS — S300XXA Contusion of lower back and pelvis, initial encounter: Secondary | ICD-10-CM | POA: Insufficient documentation

## 2017-12-06 DIAGNOSIS — Z7982 Long term (current) use of aspirin: Secondary | ICD-10-CM | POA: Diagnosis not present

## 2017-12-06 DIAGNOSIS — N3000 Acute cystitis without hematuria: Secondary | ICD-10-CM | POA: Insufficient documentation

## 2017-12-06 DIAGNOSIS — S0083XA Contusion of other part of head, initial encounter: Secondary | ICD-10-CM | POA: Diagnosis not present

## 2017-12-06 DIAGNOSIS — I10 Essential (primary) hypertension: Secondary | ICD-10-CM | POA: Diagnosis not present

## 2017-12-06 DIAGNOSIS — Z789 Other specified health status: Secondary | ICD-10-CM

## 2017-12-06 DIAGNOSIS — R1030 Lower abdominal pain, unspecified: Secondary | ICD-10-CM | POA: Diagnosis not present

## 2017-12-06 DIAGNOSIS — S8011XA Contusion of right lower leg, initial encounter: Secondary | ICD-10-CM | POA: Insufficient documentation

## 2017-12-06 DIAGNOSIS — Z7409 Other reduced mobility: Secondary | ICD-10-CM | POA: Diagnosis not present

## 2017-12-06 DIAGNOSIS — W06XXXA Fall from bed, initial encounter: Secondary | ICD-10-CM | POA: Insufficient documentation

## 2017-12-06 DIAGNOSIS — S8012XA Contusion of left lower leg, initial encounter: Secondary | ICD-10-CM | POA: Diagnosis not present

## 2017-12-06 DIAGNOSIS — R296 Repeated falls: Secondary | ICD-10-CM | POA: Insufficient documentation

## 2017-12-06 DIAGNOSIS — M5459 Other low back pain: Secondary | ICD-10-CM

## 2017-12-06 DIAGNOSIS — Y999 Unspecified external cause status: Secondary | ICD-10-CM | POA: Insufficient documentation

## 2017-12-06 DIAGNOSIS — Z853 Personal history of malignant neoplasm of breast: Secondary | ICD-10-CM | POA: Insufficient documentation

## 2017-12-06 DIAGNOSIS — E119 Type 2 diabetes mellitus without complications: Secondary | ICD-10-CM | POA: Insufficient documentation

## 2017-12-06 DIAGNOSIS — K118 Other diseases of salivary glands: Secondary | ICD-10-CM | POA: Insufficient documentation

## 2017-12-06 DIAGNOSIS — R531 Weakness: Secondary | ICD-10-CM | POA: Diagnosis not present

## 2017-12-06 DIAGNOSIS — E039 Hypothyroidism, unspecified: Secondary | ICD-10-CM | POA: Insufficient documentation

## 2017-12-06 DIAGNOSIS — Z794 Long term (current) use of insulin: Secondary | ICD-10-CM | POA: Diagnosis not present

## 2017-12-06 DIAGNOSIS — M545 Low back pain: Secondary | ICD-10-CM | POA: Insufficient documentation

## 2017-12-06 DIAGNOSIS — T07XXXA Unspecified multiple injuries, initial encounter: Secondary | ICD-10-CM

## 2017-12-06 DIAGNOSIS — Z79899 Other long term (current) drug therapy: Secondary | ICD-10-CM | POA: Insufficient documentation

## 2017-12-06 DIAGNOSIS — S0003XA Contusion of scalp, initial encounter: Secondary | ICD-10-CM | POA: Diagnosis not present

## 2017-12-06 DIAGNOSIS — Y92003 Bedroom of unspecified non-institutional (private) residence as the place of occurrence of the external cause: Secondary | ICD-10-CM | POA: Insufficient documentation

## 2017-12-06 DIAGNOSIS — Y9384 Activity, sleeping: Secondary | ICD-10-CM | POA: Insufficient documentation

## 2017-12-06 DIAGNOSIS — S6992XA Unspecified injury of left wrist, hand and finger(s), initial encounter: Secondary | ICD-10-CM | POA: Diagnosis present

## 2017-12-06 DIAGNOSIS — S6292XA Unspecified fracture of left wrist and hand, initial encounter for closed fracture: Secondary | ICD-10-CM

## 2017-12-06 HISTORY — DX: Unspecified osteoarthritis, unspecified site: M19.90

## 2017-12-06 LAB — COMPREHENSIVE METABOLIC PANEL
ALT: 41 U/L (ref 0–44)
AST: 45 U/L — ABNORMAL HIGH (ref 15–41)
Albumin: 2.8 g/dL — ABNORMAL LOW (ref 3.5–5.0)
Alkaline Phosphatase: 160 U/L — ABNORMAL HIGH (ref 38–126)
Anion gap: 9 (ref 5–15)
BUN: 36 mg/dL — ABNORMAL HIGH (ref 8–23)
CO2: 23 mmol/L (ref 22–32)
Calcium: 8.3 mg/dL — ABNORMAL LOW (ref 8.9–10.3)
Chloride: 109 mmol/L (ref 98–111)
Creatinine, Ser: 1.33 mg/dL — ABNORMAL HIGH (ref 0.44–1.00)
GFR calc Af Amer: 42 mL/min — ABNORMAL LOW (ref >60)
GFR calc non Af Amer: 36 mL/min — ABNORMAL LOW (ref >60)
Glucose, Bld: 50 mg/dL — ABNORMAL LOW (ref 70–99)
Potassium: 3.9 mmol/L (ref 3.5–5.1)
Sodium: 141 mmol/L (ref 135–145)
Total Bilirubin: 0.8 mg/dL (ref 0.3–1.2)
Total Protein: 5.9 g/dL — ABNORMAL LOW (ref 6.5–8.1)

## 2017-12-06 LAB — CBC WITH DIFFERENTIAL/PLATELET
Basophils Absolute: 0 10*3/uL (ref 0.0–0.1)
Basophils Relative: 0 %
Eosinophils Absolute: 0 10*3/uL (ref 0.0–0.7)
Eosinophils Relative: 0 %
HCT: 34.2 % — ABNORMAL LOW (ref 36.0–46.0)
Hemoglobin: 11 g/dL — ABNORMAL LOW (ref 12.0–15.0)
Lymphocytes Relative: 8 %
Lymphs Abs: 0.8 10*3/uL (ref 0.7–4.0)
MCH: 28.9 pg (ref 26.0–34.0)
MCHC: 32.2 g/dL (ref 30.0–36.0)
MCV: 90 fL (ref 78.0–100.0)
Monocytes Absolute: 1.4 10*3/uL — ABNORMAL HIGH (ref 0.1–1.0)
Monocytes Relative: 13 %
Neutro Abs: 8.4 10*3/uL — ABNORMAL HIGH (ref 1.7–7.7)
Neutrophils Relative %: 79 %
Platelets: 353 10*3/uL (ref 150–400)
RBC: 3.8 MIL/uL — ABNORMAL LOW (ref 3.87–5.11)
RDW: 15.6 % — ABNORMAL HIGH (ref 11.5–15.5)
WBC: 10.6 10*3/uL — ABNORMAL HIGH (ref 4.0–10.5)

## 2017-12-06 LAB — I-STAT TROPONIN, ED: Troponin i, poc: 0.02 ng/mL (ref 0.00–0.08)

## 2017-12-06 LAB — PROTIME-INR
INR: 1.09
Prothrombin Time: 14 seconds (ref 11.4–15.2)

## 2017-12-06 LAB — I-STAT CG4 LACTIC ACID, ED: Lactic Acid, Venous: 0.86 mmol/L (ref 0.5–1.9)

## 2017-12-06 LAB — LIPASE, BLOOD: Lipase: 44 U/L (ref 11–51)

## 2017-12-06 LAB — BRAIN NATRIURETIC PEPTIDE: B Natriuretic Peptide: 143.9 pg/mL — ABNORMAL HIGH (ref 0.0–100.0)

## 2017-12-06 MED ORDER — GLIMEPIRIDE 2 MG PO TABS
2.0000 mg | ORAL_TABLET | Freq: Two times a day (BID) | ORAL | Status: DC
  Administered 2017-12-07 – 2017-12-08 (×3): 2 mg via ORAL
  Filled 2017-12-06 (×5): qty 1

## 2017-12-06 MED ORDER — FUROSEMIDE 20 MG PO TABS
20.0000 mg | ORAL_TABLET | Freq: Every day | ORAL | Status: DC | PRN
  Administered 2017-12-08: 20 mg via ORAL
  Filled 2017-12-06 (×3): qty 1

## 2017-12-06 MED ORDER — LEVOTHYROXINE SODIUM 50 MCG PO TABS
50.0000 ug | ORAL_TABLET | Freq: Every day | ORAL | Status: DC
  Administered 2017-12-07 – 2017-12-08 (×2): 50 ug via ORAL
  Filled 2017-12-06 (×2): qty 1

## 2017-12-06 MED ORDER — FLUOXETINE HCL 20 MG PO CAPS
20.0000 mg | ORAL_CAPSULE | Freq: Every day | ORAL | Status: DC
  Administered 2017-12-07 – 2017-12-08 (×2): 20 mg via ORAL
  Filled 2017-12-06 (×2): qty 1

## 2017-12-06 MED ORDER — COENZYME Q10 30 MG PO CAPS: 30.0000 mg | ORAL_CAPSULE | Freq: Every day | ORAL | Status: DC

## 2017-12-06 MED ORDER — IOPAMIDOL (ISOVUE-300) INJECTION 61%
INTRAVENOUS | Status: AC
  Filled 2017-12-06: qty 100

## 2017-12-06 MED ORDER — OMEGA-3-ACID ETHYL ESTERS 1 G PO CAPS
1.0000 g | ORAL_CAPSULE | Freq: Every day | ORAL | Status: DC
  Administered 2017-12-07 – 2017-12-08 (×2): 1 g via ORAL
  Filled 2017-12-06 (×2): qty 1

## 2017-12-06 MED ORDER — TIMOLOL MALEATE 0.5 % OP SOLN
1.0000 [drp] | Freq: Two times a day (BID) | OPHTHALMIC | Status: DC
  Administered 2017-12-07 – 2017-12-08 (×3): 1 [drp] via OPHTHALMIC
  Filled 2017-12-06: qty 5

## 2017-12-06 MED ORDER — ASPIRIN EC 81 MG PO TBEC
162.0000 mg | DELAYED_RELEASE_TABLET | Freq: Every day | ORAL | Status: DC
  Administered 2017-12-07 – 2017-12-08 (×2): 162 mg via ORAL
  Filled 2017-12-06 (×2): qty 2

## 2017-12-06 MED ORDER — CALCIUM CARBONATE-VITAMIN D 500-200 MG-UNIT PO TABS
2.0000 | ORAL_TABLET | Freq: Every day | ORAL | Status: DC
  Administered 2017-12-07 – 2017-12-08 (×2): 2 via ORAL
  Filled 2017-12-06 (×2): qty 2

## 2017-12-06 MED ORDER — IOPAMIDOL (ISOVUE-300) INJECTION 61%
100.0000 mL | Freq: Once | INTRAVENOUS | Status: AC | PRN
  Administered 2017-12-06: 80 mL via INTRAVENOUS

## 2017-12-06 MED ORDER — BRIMONIDINE TARTRATE 0.2 % OP SOLN
1.0000 [drp] | Freq: Two times a day (BID) | OPHTHALMIC | Status: DC
  Administered 2017-12-07 – 2017-12-08 (×3): 1 [drp] via OPHTHALMIC
  Filled 2017-12-06: qty 5

## 2017-12-06 MED ORDER — BRIMONIDINE TARTRATE-TIMOLOL 0.2-0.5 % OP SOLN: 1.0000 [drp] | Freq: Two times a day (BID) | OPHTHALMIC | Status: DC

## 2017-12-06 MED ORDER — POTASSIUM CHLORIDE CRYS ER 10 MEQ PO TBCR
10.0000 meq | EXTENDED_RELEASE_TABLET | Freq: Every day | ORAL | Status: DC
  Administered 2017-12-07 – 2017-12-08 (×2): 10 meq via ORAL
  Filled 2017-12-06 (×2): qty 1

## 2017-12-06 MED ORDER — INSULIN GLARGINE 100 UNIT/ML ~~LOC~~ SOLN
43.0000 [IU] | Freq: Every day | SUBCUTANEOUS | Status: DC
  Administered 2017-12-08: 43 [IU] via SUBCUTANEOUS
  Filled 2017-12-06 (×2): qty 0.43

## 2017-12-06 MED ORDER — MORPHINE SULFATE (PF) 2 MG/ML IV SOLN
2.0000 mg | Freq: Once | INTRAVENOUS | Status: AC
  Administered 2017-12-06: 2 mg via INTRAVENOUS
  Filled 2017-12-06: qty 1

## 2017-12-06 MED ORDER — LOSARTAN POTASSIUM 50 MG PO TABS
100.0000 mg | ORAL_TABLET | Freq: Every day | ORAL | Status: DC
  Administered 2017-12-07 – 2017-12-08 (×2): 100 mg via ORAL
  Filled 2017-12-06 (×2): qty 2

## 2017-12-06 MED ORDER — TRAMADOL HCL 50 MG PO TABS
50.0000 mg | ORAL_TABLET | Freq: Four times a day (QID) | ORAL | Status: DC | PRN
  Administered 2017-12-07 – 2017-12-08 (×3): 50 mg via ORAL
  Filled 2017-12-06 (×3): qty 1

## 2017-12-06 MED ORDER — OMEGA-3 FATTY ACIDS 1000 MG PO CAPS: 1.0000 g | ORAL_CAPSULE | Freq: Every day | ORAL | Status: DC

## 2017-12-06 MED ORDER — VITAMIN D3 25 MCG (1000 UNIT) PO TABS
1000.0000 [IU] | ORAL_TABLET | Freq: Every day | ORAL | Status: DC
  Administered 2017-12-07 – 2017-12-08 (×2): 1000 [IU] via ORAL
  Filled 2017-12-06 (×2): qty 1

## 2017-12-06 MED ORDER — SODIUM CHLORIDE 0.9 % IV SOLN
Freq: Once | INTRAVENOUS | Status: AC
  Administered 2017-12-06: 15:00:00 via INTRAVENOUS

## 2017-12-06 MED ORDER — APOAEQUORIN 10 MG PO CAPS: ORAL_CAPSULE | Freq: Every day | ORAL | Status: DC

## 2017-12-06 MED ORDER — VERAPAMIL HCL ER 240 MG PO TBCR
240.0000 mg | EXTENDED_RELEASE_TABLET | Freq: Every day | ORAL | Status: DC
  Administered 2017-12-07 (×2): 240 mg via ORAL
  Filled 2017-12-06 (×3): qty 1

## 2017-12-06 MED ORDER — ROSUVASTATIN CALCIUM 20 MG PO TABS
20.0000 mg | ORAL_TABLET | Freq: Every day | ORAL | Status: DC
  Administered 2017-12-07 – 2017-12-08 (×2): 20 mg via ORAL
  Filled 2017-12-06 (×2): qty 1

## 2017-12-06 MED ORDER — VITAMIN B-12 1000 MCG PO TABS
1000.0000 ug | ORAL_TABLET | Freq: Every day | ORAL | Status: DC
  Administered 2017-12-07 – 2017-12-08 (×2): 1000 ug via ORAL
  Filled 2017-12-06 (×2): qty 1

## 2017-12-06 NOTE — ED Provider Notes (Signed)
[name] DEPT Provider Note   CSN: [id number] Arrival date & time: 12/06/17  1241     History   Chief Complaint Chief Complaint  Patient presents with  . Back Pain  . Hand Pain  . Fall    HPI Diana [name] is a 84 y.o. female.  HPI Patient reports she fell almost a month ago and hit the back of her head and required staples.  She seemed to have recovered.  She did not think she had any ongoing serious problems.  At that time she has had other contusions to her back and buttocks.  She continued to live independently and take care of herself.  She reports over the past 2 weeks now she has been steadily declining in her ability to function.  Her back pain has been escalating significantly.  She reported quite low and close to her buttocks.  She reports now it is very hard for her to walk and bear weight due to pain.  She is using a walker and a cane in her home.  She reports her balance has gotten very bad and she has now had another fall.  She rolled out of her bed and injured her hand.  On exam she has facial contusions that she was not aware of.  She reports that must have happened last night as well.  She reports it was extremely difficult but she was able to get herself back up and get into bed.  She reports now she is having so much pain in her back in her extremities that she cannot function independently.  She reports that home health has been arranged but they will be coming 2 times a week to help her with bathing and household tasks.  Patient reports that this point she cannot get up and function independently enough to go to the bathroom or feed herself.  She reports her pain in her lower back\buttocks and legs with the severe she can no longer get up and use her walker as she was previously. Past Medical History:  Diagnosis Date  . Anemia   . Anemia of decreased vitamin B12 absorption 05/26/2005  . Arthritis   . Breast cancer ([place]) 2001  .  Cataract   . History of tobacco abuse   . Hx of colonic polyps   . Hyperlipidemia   . Hypertension   . Hypothyroidism   . Obesity   . OSA on CPAP 09/2007   AHI 10.6/hr overall, 43.64/hr during REM  . Osteopenia   . Renal insufficiency   . Type 2 diabetes mellitus [facility])     Patient Active Problem List   Diagnosis Date Noted  . HTN (hypertension) 05/15/2013  . OSA on CPAP 05/15/2013  . Hypothyroidism 05/15/2013  . DM2 (diabetes mellitus, type 2) ([place]) 05/15/2013  . Lower extremity edema 05/15/2013  . Renal insufficiency 05/15/2013  . Morbid obesity ([place]) 05/15/2013  . Breast cancer ([place]) 03/18/2011    Past Surgical History:  Procedure Laterality Date  . BACK SURGERY  05/30/2009  . BOWEL RESECTION  1974  . CATARACT EXTRACTION Bilateral    bilateral  . CHOLECYSTECTOMY  1974  . MASTECTOMY Bilateral 2001   bilateral sentinel lymph nodes bio  . NM MYOCAR PERF WALL MOTION  06/2007   dipyridamole; perfusion defect in inferior myocardium consistent with diaphragmatic attenuation, remaining myocardium with no ischemia/infarct, EF 73%; normal, low risk scan   . TOTAL ABDOMINAL HYSTERECTOMY  1975  . TRANSTHORACIC ECHOCARDIOGRAM  02/2010   ZO=>10%, stage 1 diastolic dysfunction; borderline RV enlargement; LA mild-mod dilated; mild mitral annular calcif & mild MR; mild TR with normal RSVP, AV moderately sclerotics     OB History   None      Home Medications    Prior to Admission medications   Medication Sig Start Date End Date Taking? Authorizing Provider  aspirin 81 MG tablet Take 162 mg by mouth daily.     [provider]  calcium-vitamin D (OSCAL WITH D) 500-200 MG-UNIT per tablet Take 2 tablets by mouth.     [provider]  cholecalciferol (VITAMIN D) 1000 UNITS tablet Take 1,000 Units by mouth. 2 tabs    [provider]  co-enzyme Q-10 30 MG capsule Take 30 mg by mouth.     [provider]  COMBIGAN 0.2-0.5 % ophthalmic solution Place 1  drop into both eyes every 12 (twelve) hours.  01/13/12   [provider]  Cyanocobalamin (VITAMIN B 12 PO) Take 1 tablet by mouth daily.     [provider]  fish oil-omega-3 fatty acids 1000 MG capsule Take 1 g by mouth daily.     [provider]  FLUoxetine (PROZAC) 20 MG capsule Take 20 mg by mouth daily.     [provider]  FLUoxetine (PROZAC) 20 MG capsule Take 1 capsule by mouth daily. 07/30/16   [provider]  furosemide (LASIX) 40 MG tablet Take 40 mg by mouth as needed for edema.     [provider]  glimepiride (AMARYL) 1 MG tablet Take 1 mg by mouth 2 (two) times daily before a meal.      [provider]  GLUCOSAMINE-CHONDROITIN PO Take 1 capsule by mouth. 1500/1200     [provider]  insulin glargine (LANTUS) 100 UNIT/ML injection Inject 43 Units into the skin at bedtime. Sliding scale    [provider]  levothyroxine (SYNTHROID, LEVOTHROID) 50 MCG tablet Take 50 mcg by mouth daily.      [provider]  losartan (COZAAR) 100 MG tablet TAKE 1 TABLET (100 MG TOTAL) BY MOUTH DAILY. 11/11/17   Troy Sine, MD  losartan (COZAAR) 100 MG tablet TAKE ONE TABLET BY MOUTH DAILY 11/11/17   Troy Sine, MD  magnesium oxide (MAG-OX) 400 MG tablet Take 400 mg by mouth.     [provider]  meclizine (ANTIVERT) 25 MG tablet Take 25 mg by mouth.    [provider]  Multiple Vitamins-Minerals (PRESERVISION AREDS 2) CAPS Take 2 capsules by mouth daily.    [provider]  multivitamin [facility]) per tablet Take 1 tablet by mouth daily.      [provider]  NON FORMULARY CPAP    [provider]  potassium chloride (KLOR-CON) 10 MEQ CR tablet Take 20 mEq by mouth.     [provider]  Probiotic Product (PROBIOTIC DAILY PO) Take 1 capsule by mouth daily.    [provider]  simvastatin (ZOCOR) 20 MG tablet Take 1 tablet (20 mg total) by mouth at  bedtime. 10/09/15   Troy Sine, MD  verapamil (CALAN-SR) 240 MG CR tablet Take 1 tablet (240 mg total) by mouth at bedtime. Please keep upcoming appt for further refills. 11/05/17   Troy Sine, MD    Family History Family History  Problem Relation Age of Onset  . Heart attack Mother   . Heart attack Father   . Colon cancer Neg Hx  Social History Social History   Tobacco Use  . Smoking status: Former Smoker    Years: 13.00    Types: Cigarettes    Last attempt to quit: 05/02/2002    Years since quitting: 15.6  . Smokeless tobacco: Never Used  Substance Use Topics  . Alcohol use: No    Alcohol/week: 0.0 standard drinks  . Drug use: Never     Allergies   Codeine sulfate   Review of Systems Review of Systems 10 Systems reviewed and are negative for acute change except as noted in the HPI.  Physical Exam Updated Vital Signs BP (!) 168/93   Pulse 74   Temp 98.2 F (36.8 C) (Oral)   Resp 18   Ht 5\' 5"  (1.651 m)   Wt 102.1 kg   SpO2 95%   BMI 37.44 kg/m   Physical Exam  Constitutional:  Patient is alert and nontoxic.  No respiratory distress at rest.  Obesity and deconditioning.  HENT:  Patient has a contusion with small hematoma to the right zygomatic arch.  Small hematoma to the right forehead less than 2 cm.  No lacerations.  Mucous membranes slightly dry.  Eyes: Pupils are equal, round, and reactive to light. EOM are normal.  Neck:  C-spine nontender to palpation.  Cardiovascular:  2\6 systolic ejection murmur.  No gross rub or gallop.  Occasional ectopy.  Pulmonary/Chest:  No respiratory distress.  Breath sounds are symmetric with good airflow.  No contusions or abrasions to the thoracic back.  Patient denies palpation to the thoracic back.  She denies pain to palpation of anterior chest wall.  Abdominal:  Patient endorses moderate pain in the central and suprapubic area.  No guarding.  No rash in intertriginous groin fold.  Musculoskeletal:    Patient expresses severe pain when certain movements cause motion in her lower back.  Patient cannot sit upright in the stretcher due to severe pain.  She can roll onto her side.  She reports the pain that she is experiencing is down to the very low back and sacral area.  On examination, patient has residual contusion that is linear and purple on her right buttocks.  This is approximately 15 cm x 4 cm.  No surrounding erythema.  Skin condition is otherwise good.  Patient has a large area of swelling\contusion to the left dorsum of the hand over the fourth and fifth metacarpal.  She has several bruises to the lower extremities.  No deformities however of the lower extremities.  I can put lower extremity range of motion without severe pain into the hip or the knee.  It however on the left exacerbate patient's low back\sacral pain.  Skin: Skin is warm and dry.  Psychiatric: She has a normal mood and affect.     ED Treatments / Results  Labs (all labs ordered are listed, but only abnormal results are displayed) Labs Reviewed  CBC WITH DIFFERENTIAL/PLATELET - Abnormal; Notable for the following components:      Result Value   WBC 10.6 (*)    RBC 3.80 (*)    Hemoglobin 11.0 (*)    HCT 34.2 (*)    RDW 15.6 (*)    Neutro Abs 8.4 (*)    Monocytes Absolute 1.4 (*)    All other components within normal limits  URINE CULTURE  COMPREHENSIVE METABOLIC PANEL  LIPASE, BLOOD  BRAIN NATRIURETIC PEPTIDE  PROTIME-INR  URINALYSIS, ROUTINE W REFLEX MICROSCOPIC  I-STAT CG4 LACTIC ACID, ED  I-STAT TROPONIN, ED  EKG EKG Interpretation  Date/Time:  Sunday December 06 2017 15:14:15 EDT Ventricular Rate:  72 PR Interval:    QRS Duration: 92 QT Interval:  418 QTC Calculation: 458 R Axis:   -15 Text Interpretation:  Sinus rhythm Atrial premature complex Borderline left axis deviation no ischemic changes, no sig change from old Confirmed by Charlesetta Shanks [phone]) on 12/06/2017 3:23:36  PM   Radiology Dg Hand Complete Left  Result Date: 12/06/2017 CLINICAL DATA:  Left hand injury with bruising EXAM: LEFT HAND - COMPLETE 3+ VIEW COMPARISON:  None. FINDINGS: Acute fracture involving the shaft of the fifth metacarpal with mild volar angulation and about 1 bone with of volar displacement of distal fracture fragment. There is overriding of the fracture segments. There is no subluxation. IMPRESSION: Acute displaced and angulated fracture involving the fifth metacarpal Electronically Signed   By: Donavan Foil M.D.   On: 12/06/2017 14:41    Procedures Procedures (including critical care time)  Medications Ordered in ED Medications  morphine 2 MG/ML injection 2 mg (2 mg Intravenous Given 12/06/17 1524)  0.9 %  sodium chloride infusion ( Intravenous New Bag/Given 12/06/17 1524)     Initial Impression / Assessment and Plan / ED Course  I have reviewed the triage vital signs and the nursing notes.  Pertinent labs & imaging results that were available during my care of the patient were reviewed by me and considered in my medical decision making (see chart for details).    Patient has had a fall about a month ago and now subsequent repeat fall.  She has severe lower back pain impeding her ability to ambulate and care for herself in her home.  She also had a repeat injury to her face and head.  CT scan will be obtained.  Positive fracture identified in the left hand.  Plan will be for review of remainder of diagnostic studies by Dr. Zenia Resides.  Patient's final disposition will be dependent on additional identified injuries or metabolic derangements.  Patient has had failure of ADLs and independent living.  Patient will also require social work consultation.  Final Clinical Impressions(s) / ED Diagnoses   Final diagnoses:  Frequent falls  Intractable low back pain  Closed fracture of left hand, initial encounter  Multiple contusions  General weakness  Impaired mobility and activities  of daily living    ED Discharge Orders    None       Charlesetta Shanks, MD 12/06/17 1547

## 2017-12-06 NOTE — ED Triage Notes (Addendum)
Per EMS- Patient's son reports that the patient had a fall on 11/08/17 and has had decreased mobility since the fall. The patient's son reported that the patient is unable to ambulate without assistance and told EMS that he wanted her placed in a facility.  In triage- Patient stated, that she lives by herself and was able to take care of herself until recently. Patient does not have a son, but does have great-nephew who checks on her often. Patient states she needs help because she can not help herself. Patient states she fell out of bed last night and has bruising to the left hand and c/o pain 5th left finger. Patient is able to move.

## 2017-12-06 NOTE — ED Provider Notes (Signed)
Patient sent to me by Dr. Vallery Ridge.  X-rays reviewed and no acute injuries noted.  Patient's lower extremity neurological exam is stable.  No concern for cauda equina.  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  Patient has had gait trouble for quite some time and family have been looking at nursing home placement.  She has a broken hand at this time and therefore since she cannot use a walker has no home health will need nursing home placement.  Orders have been placed   Lacretia Leigh, MD 12/06/17 1826

## 2017-12-06 NOTE — Progress Notes (Signed)
PHARMACIST - PHYSICIAN ORDER COMMUNICATION  CONCERNING: P&T Medication Policy on Herbal Medications  DESCRIPTION:  This patient's order for:  CoEnzymeQ10 & Apoaequorin  has been noted.  This product(s) is classified as an "herbal" or natural product. Due to a lack of definitive safety studies or FDA approval, nonstandard manufacturing practices, plus the potential risk of unknown drug-drug interactions while on inpatient medications, the Pharmacy and Therapeutics Committee does not permit the use of "herbal" or natural products of this type within [facility].   ACTION TAKEN: The pharmacy department is unable to verify this order at this time and your patient has been informed of this safety policy. Please reevaluate patient's clinical condition at discharge and address if the herbal or natural product(s) should be resumed at that time.  Netta Cedars, PharmD, BCPS 12/06/2017@7 :58 PM

## 2017-12-06 NOTE — ED Notes (Signed)
Pt care assumed, obtained verbal report.  Pt is resting and appears comfortable.  Her nephew is at bedside.  Reason for delay explained to nephew.

## 2017-12-07 LAB — URINALYSIS, ROUTINE W REFLEX MICROSCOPIC
Bilirubin Urine: NEGATIVE
Glucose, UA: NEGATIVE mg/dL
Ketones, ur: NEGATIVE mg/dL
Nitrite: POSITIVE — AB
Protein, ur: 30 mg/dL — AB
Specific Gravity, Urine: 1.03 (ref 1.005–1.030)
pH: 6 (ref 5.0–8.0)

## 2017-12-07 LAB — CBG MONITORING, ED
Glucose-Capillary: 43 mg/dL — CL (ref 70–99)
Glucose-Capillary: 153 mg/dL — ABNORMAL HIGH (ref 70–99)
Glucose-Capillary: 169 mg/dL — ABNORMAL HIGH (ref 70–99)

## 2017-12-07 NOTE — ED Notes (Signed)
Pt had tomato soup

## 2017-12-07 NOTE — Evaluation (Signed)
Physical Therapy Evaluation Patient Details Name: Diana [name] MRN: [id number] DOB: 07/08/1933 Today's Date: 12/07/2017   History of Present Illness  Pt in ED with recent fall out of bed with L hand 5th metacarpel displaced fracture, and great amount of low back pain and pain radiating down both legs. Pt reports she fell at the beginning of Sept and had been trying to continue to manage independently at home but it continue to be difficult as her low back pain contiued to increase. She lives alone, prior to the Sept fall completely indepenet , driving, grocery shopping, uses a cane and walker occassionally depending on the pain in her LEs. She does have a Chartered certified accountant that helpws with cleaning 1x/week.   Clinical Impression  Pt with a great amount of pain in her low back with very little movement today. She was able to stand at bedside with RW using L UE and limiting R UE due to R hand metacarpal fracture.Pt with decreased ability and with all mobility at this time, recommending ST-SNF to help pt progress with her mobility to eventually return home independently again.     Follow Up Recommendations SNF    Equipment Recommendations  None recommended by PT    Recommendations for Other Services       Precautions / Restrictions Precautions Required Braces or Orthoses: Other Brace/Splint Other Brace/Splint: L hand in ulnar gutter in soft ace wrap at the moment.  Restrictions Weight Bearing Restrictions: (assuming NWB on L hand at this moment, no orders or reference to this in the chart . just being conservative on the 5th metacarpel displaced fracture at this time until other orders or instruction.  )      Mobility  Bed Mobility Overal bed mobility: Needs Assistance Bed Mobility: Supine to Sit;Sit to Supine     Supine to sit: Mod assist Sit to supine: Max assist   General bed mobility comments: had to assist with LEs and upper body due to the pain. Tried to educate patient with  log roll forst (sidelying ) to decreae the pain, however needs continued instruction and practice of this.   Transfers Overall transfer level: Needs assistance Equipment used: Rolling walker (2 wheeled)(need to try to use platform RW next time . minmized use of R hand with standing today. ) Transfers: Sit to/from Stand Sit to Stand: Mod assist            Ambulation/Gait Ambulation/Gait assistance: Mod assist(attemepted side stepping at bed, but when pt weight shifted she had great pain , so she sat down )              Stairs            Wheelchair Mobility    Modified Rankin (Stroke Patients Only)       Balance                                             Pertinent Vitals/Pain Pain Assessment: 0-10 Pain Score: 8  Pain Location: middle of low back , lumbar area, and then shoots down both legs withe weight bearing.  Pain Descriptors / Indicators: Shooting;Stabbing;Sharp Pain Intervention(s): Limited activity within patient's tolerance    Home Living Family/patient expects to be discharged to:: Skilled nursing facility Living Arrangements: Alone Available Help at Discharge: Family;Friend(s);Available PRN/intermittently Type of Home: House(split level) Home Access: Stairs to enter  Entrance Stairs-Rails: Right Entrance Stairs-Number of Steps: 5 or more  Home Layout: Multi-level Home Equipment: Walker - 2 wheels;Walker - standard;Cane - single point      Prior Function Level of Independence: Independent         Comments: prior to her initial fall beginning of Sept , pt states she was doing fine. Used a cane occassionally, drove, grocery shopped, etc. She does have a Chartered certified accountant for house cleaning and [place]. Since Sept she has been able to move less neediing RW and cane more and then in last 2 weeks has been a lot harder to even do basics. Since fall 2 nights ago, she has not been able to move at all.      Hand Dominance         Extremity/Trunk Assessment        Lower Extremity Assessment Lower Extremity Assessment: Overall WFL for tasks assessed(pt was grossly strong in her LEs , just with little movment she got sharp pains in her low back. )       Communication      Cognition Arousal/Alertness: Awake/alert Behavior During Therapy: WFL for tasks assessed/performed Overall Cognitive Status: Within Functional Limits for tasks assessed                                        General Comments      Exercises     Assessment/Plan    PT Assessment Patient needs continued PT services  PT Problem List Decreased strength;Decreased activity tolerance;Decreased mobility       PT Treatment Interventions Gait training;DME instruction;Therapeutic activities;Therapeutic exercise;Stair training;Functional mobility training;Patient/family education    PT Goals (Current goals can be found in the Care Plan section)  Acute Rehab PT Goals Patient Stated Goal: I want to get better and be independent in my home again .  PT Goal Formulation: With patient Time For Goal Achievement: 12/21/17 Potential to Achieve Goals: Good    Frequency Min 3X/week   Barriers to discharge Decreased caregiver support      Co-evaluation               AM-PAC PT "6 Clicks" Daily Activity  Outcome Measure Difficulty turning over in bed (including adjusting bedclothes, sheets and blankets)?: Unable Difficulty moving from lying on back to sitting on the side of the bed? : Unable Difficulty sitting down on and standing up from a chair with arms (e.g., wheelchair, bedside commode, etc,.)?: Unable Help needed moving to and from a bed to chair (including a wheelchair)?: Total Help needed walking in hospital room?: Total Help needed climbing 3-5 steps with a railing? : Total 6 Click Score: 6    End of Session   Activity Tolerance: Patient limited by pain Patient left: in bed;with call bell/phone within  reach;with nursing/sitter in room Nurse Communication: Mobility status PT Visit Diagnosis: Other abnormalities of gait and mobility (R26.89);Pain Pain - part of body: (back )    Time: 4403-4742 PT Time Calculation (min) (ACUTE ONLY): 30 min   Charges:   PT Evaluation $PT Eval Low Complexity: 1 Low PT Treatments $Therapeutic Activity: 8-22 mins        Clide Dales, PT Acute Rehabilitation Services Pager: [phone] Office: [phone] 12/07/2017   Clide Dales 12/07/2017, 12:29 PM

## 2017-12-07 NOTE — ED Notes (Addendum)
Pt given 8 oz of orange juice and is eating breakfast

## 2017-12-07 NOTE — ED Notes (Signed)
Pt transferred into a hospital bed for comfort.

## 2017-12-07 NOTE — ED Notes (Signed)
Pt to room #29. Pt pleasant, informs this nurse she is unable to care for herself independently, reports fall earlier this month.

## 2017-12-07 NOTE — Progress Notes (Addendum)
CSW consulted for assistance with possible placement of patient. CSW spoke with patient at bedside. Patient lives at home by herself. Patient reports that she has been able to take care of herself up until recently. Patient requesting to go to a rehab facility until she feels stronger. CSW aware patient has [facility] insurance and that authorization may take some time. CSW has requested PT consult to get patient assessed. CSW will continue to follow.  1:12pm- CSW spoke with patient at bedside who has expressed interest in going to [facility]. CSW spoke with Elyse Hsu in admissions [phone], who stated she would have nurse review patient information and call CSW back. Per Elyse Hsu, they do have a female rehab bed available. CSW will await return call.  1:39pm- CSW received return call from Elyse Hsu who stated she would be able to offer patient a bed at [facility]. Per Elyse Hsu, she would submit for authorization now and anticipates they will be able to accept patient tomorrow. CSW to update patient and RN.   Ollen Barges, [place] Work Department  [employer]  [phone]

## 2017-12-07 NOTE — NC FL2 (Signed)
[place] LEVEL OF CARE SCREENING TOOL     IDENTIFICATION  Patient Name: Diana [name] Birthdate: 03-01-1934 Sex: female Admission Date (Current Location): 12/06/2017  [facility] and [place] Number:  Herbalist and Address:  [facility],  [place] [address], [place]      Provider Number: [id number]  Attending Physician Name and Address:  Default, Provider, MD  Relative Name and Phone Number:       Current Level of Care: Hospital Recommended Level of Care: [place] Prior Approval Number:    Date Approved/Denied:   PASRR Number: [id number]  Discharge Plan: SNF    Current Diagnoses: Patient Active Problem List   Diagnosis Date Noted  . HTN (hypertension) 05/15/2013  . OSA on CPAP 05/15/2013  . Hypothyroidism 05/15/2013  . DM2 (diabetes mellitus, type 2) ([place]) 05/15/2013  . Lower extremity edema 05/15/2013  . Renal insufficiency 05/15/2013  . Morbid obesity ([place]) 05/15/2013  . Breast cancer ([place]) 03/18/2011    Orientation RESPIRATION BLADDER Height & Weight     Self, Time, Situation, Place  Normal Incontinent Weight: 225 lb (102.1 kg) Height:  5\' 5"  (165.1 cm)  BEHAVIORAL SYMPTOMS/MOOD NEUROLOGICAL BOWEL NUTRITION STATUS      Incontinent    AMBULATORY STATUS COMMUNICATION OF NEEDS Skin   Extensive Assist Verbally Normal, Bruising                       Personal Care Assistance Level of Assistance  Bathing, Feeding, Dressing Bathing Assistance: Limited assistance Feeding assistance: Independent Dressing Assistance: Limited assistance     Functional Limitations Info             SPECIAL CARE FACTORS FREQUENCY  PT (By licensed PT), OT (By licensed OT)     PT Frequency: 5 OT Frequency: 5            Contractures      Additional Factors Info  Allergies   Allergies Info: Codeine Sulfate           Current Medications (12/07/2017):  This is the current hospital active  medication list Current Facility-Administered Medications  Medication Dose Route Frequency Provider Last Rate Last Dose  . aspirin EC tablet 162 mg  162 mg Oral Daily Lacretia Leigh, MD   162 mg at 12/07/17 1212  . brimonidine (ALPHAGAN) 0.2 % ophthalmic solution 1 drop  1 drop Both Eyes BID Lacretia Leigh, MD   1 drop at 12/07/17 6789   And  . timolol (TIMOPTIC) 0.5 % ophthalmic solution 1 drop  1 drop Both Eyes BID Lacretia Leigh, MD   1 drop at 12/07/17 [phone]  . calcium-vitamin D (OSCAL WITH D) 500-200 MG-UNIT per tablet 2 tablet  2 tablet Oral Q breakfast Lacretia Leigh, MD   2 tablet at 12/07/17 0830  . cholecalciferol (VITAMIN D) tablet 1,000 Units  1,000 Units Oral Daily Lacretia Leigh, MD   1,000 Units at 12/07/17 1213  . FLUoxetine (PROZAC) capsule 20 mg  20 mg Oral Daily Lacretia Leigh, MD   20 mg at 12/07/17 1211  . furosemide (LASIX) tablet 20 mg  20 mg Oral Daily PRN Lacretia Leigh, MD      . glimepiride (AMARYL) tablet 2 mg  2 mg Oral BID WC Lacretia Leigh, MD   2 mg at 12/07/17 0830  . insulin glargine (LANTUS) injection 43 Units  43 Units Subcutaneous QHS Lacretia Leigh, MD      . levothyroxine (SYNTHROID, [place])  tablet 50 mcg  50 mcg Oral QAC breakfast Lacretia Leigh, MD   50 mcg at 12/07/17 0831  . losartan (COZAAR) tablet 100 mg  100 mg Oral Daily Lacretia Leigh, MD   100 mg at 12/07/17 1212  . omega-3 acid ethyl esters (LOVAZA) capsule 1 g  1 g Oral Daily Lacretia Leigh, MD   1 g at 12/07/17 1213  . potassium chloride (K-DUR,KLOR-CON) CR tablet 10 mEq  10 mEq Oral Daily Lacretia Leigh, MD   10 mEq at 12/07/17 1211  . rosuvastatin (CRESTOR) tablet 20 mg  20 mg Oral Daily Lacretia Leigh, MD   20 mg at 12/07/17 1212  . traMADol (ULTRAM) tablet 50 mg  50 mg Oral Q6H PRN Lacretia Leigh, MD   50 mg at 12/07/17 0916  . verapamil (CALAN-SR) CR tablet 240 mg  240 mg Oral QHS Lacretia Leigh, MD   240 mg at 12/07/17 0917  . vitamin B-12 (CYANOCOBALAMIN) tablet 1,000 mcg  1,000 mcg  Oral Daily Lacretia Leigh, MD   1,000 mcg at 12/07/17 1212   Current Outpatient Medications  Medication Sig Dispense Refill  . Apoaequorin (PREVAGEN PO) Take 1 capsule by mouth daily.    Marland Kitchen aspirin 81 MG tablet Take 162 mg by mouth daily.     . COMBIGAN 0.2-0.5 % ophthalmic solution Place 1 drop into both eyes every 12 (twelve) hours.     Marland Kitchen FLUoxetine (PROZAC) 20 MG capsule Take 20 mg by mouth daily.     . furosemide (LASIX) 40 MG tablet Take 20 mg by mouth daily as needed for fluid or edema.     Marland Kitchen glimepiride (AMARYL) 2 MG tablet Take 2 mg by mouth 2 (two) times daily.    . insulin glargine (LANTUS) 100 UNIT/ML injection Inject 43 Units into the skin at bedtime. Sliding scale    . levothyroxine (SYNTHROID, LEVOTHROID) 50 MCG tablet Take 50 mcg by mouth daily.      Marland Kitchen losartan (COZAAR) 100 MG tablet TAKE 1 TABLET (100 MG TOTAL) BY MOUTH DAILY. 90 tablet 3  . Multiple Vitamins-Minerals (PRESERVISION AREDS 2) CAPS Take 2 capsules by mouth daily.    . potassium chloride (KLOR-CON) 10 MEQ CR tablet Take 20 mEq by mouth daily as needed (takes when she takes the lasix.).     Marland Kitchen Probiotic Product (PROBIOTIC DAILY PO) Take 1 capsule by mouth daily.    . rosuvastatin (CRESTOR) 20 MG tablet Take 20 mg by mouth daily.    . traMADol (ULTRAM) 50 MG tablet Take 50 mg by mouth every 6 (six) hours as needed for moderate pain.    . verapamil (CALAN-SR) 240 MG CR tablet Take 1 tablet (240 mg total) by mouth at bedtime. Please keep upcoming appt for further refills. 90 tablet 0  . calcium-vitamin D (OSCAL WITH D) 500-200 MG-UNIT per tablet Take 2 tablets by mouth.     . cholecalciferol (VITAMIN D) 1000 UNITS tablet Take 1,000 Units by mouth. 2 tabs    . co-enzyme Q-10 30 MG capsule Take 30 mg by mouth.     . Cyanocobalamin (VITAMIN B 12 PO) Take 1 tablet by mouth daily.     . fish oil-omega-3 fatty acids 1000 MG capsule Take 1 g by mouth daily.     Marland Kitchen GLUCOSAMINE-CHONDROITIN PO Take 1 capsule by mouth. 1500/1200      . losartan (COZAAR) 100 MG tablet TAKE ONE TABLET BY MOUTH DAILY (Patient not taking: Reported on 12/06/2017) 90 tablet 0  . magnesium  oxide (MAG-OX) 400 MG tablet Take 400 mg by mouth.     . meclizine (ANTIVERT) 25 MG tablet Take 25 mg by mouth.    . multivitamin (THERAGRAN) per tablet Take 1 tablet by mouth daily.      . NON FORMULARY CPAP    . simvastatin (ZOCOR) 20 MG tablet Take 1 tablet (20 mg total) by mouth at bedtime. (Patient not taking: Reported on 12/06/2017) 90 tablet 1     Discharge Medications: Please see discharge summary for a list of discharge medications.  Relevant Imaging Results:  Relevant Lab Results:   Additional Information SSN: [id number]  Ollen Barges, LCSWA

## 2017-12-07 NOTE — ED Notes (Signed)
Linen changed.  Pt incontinent of urine.

## 2017-12-08 MED ORDER — CEPHALEXIN 500 MG PO CAPS: 500.0000 mg | ORAL_CAPSULE | Freq: Three times a day (TID) | ORAL | 0 refills | Status: DC

## 2017-12-08 MED ORDER — CEPHALEXIN 500 MG PO CAPS
500.0000 mg | ORAL_CAPSULE | Freq: Once | ORAL | Status: AC
  Administered 2017-12-08: 500 mg via ORAL
  Filled 2017-12-08: qty 1

## 2017-12-08 MED ORDER — TRAMADOL HCL 50 MG PO TABS: 50.0000 mg | ORAL_TABLET | Freq: Four times a day (QID) | ORAL | 0 refills | Status: DC | PRN

## 2017-12-08 NOTE — ED Notes (Signed)
[facility] called for transport to [facility].

## 2017-12-08 NOTE — ED Provider Notes (Signed)
  Physical Exam  BP (!) 146/78 (BP Location: Right Arm) Comment: 146/78  Pulse 70   Temp 98.4 F (36.9 C) (Oral)   Resp 18 Comment: 18  Ht 5\' 5"  (1.651 m)   Wt 102.1 kg   SpO2 93%   BMI 37.44 kg/m   Physical Exam  ED Course/Procedures     Procedures  MDM  Patient seen this AM. Patient had L wrist fracture and splint placed. Patient unable to care for herself at home so social work and physical therapy involved. She is comfortable this morning. No issues per nursing. Patient able to be placed at [facility]. Patient's pain managed on tramadol in the ED so will continue that at the nursing home. Also had UTI so will start keflex 500 mg TID x 5 days. Will have her follow up with ortho outpatient.       Drenda Freeze, MD 12/08/17 [phone]

## 2017-12-08 NOTE — ED Notes (Signed)
Pt resting in bed with even unlabored respirations. No distress noted. Will continue to monitor.  

## 2017-12-08 NOTE — Progress Notes (Signed)
Patient has been accepted to [facility] for today 10/1. Patient, RN and EDP agreeable to discharge plan. CSW briefly spoke with patient's friend Ginger, at patient's request, regarding discharge plans. Please call report to [phone] and ask for [employer]. RN to call for transport via PT[facility].   No further social work needs. Please reconsult if needs arise.  Ollen Barges, [place] Work Department  [employer]  [phone]

## 2017-12-08 NOTE — Progress Notes (Signed)
CSW still following for discharge planning. CSW currently awaiting insurance authorization from [facility] [place] [facility]. CSW to follow up with facility regarding updates.  Ollen Barges, [place] Work Department  [employer]  [phone]

## 2017-12-08 NOTE — Discharge Instructions (Addendum)
Take tramadol every 6 hrs as needed for pain.   Keep splint in place until you see hand surgery, Dr. Amedeo Plenty. Please call Dr. Vanetta Shawl office for appointment   You have urinary tract infection. Take keflex 500 mg three times daily for 5 days   See primary care doctor  Fall precautions   Return to ER if you have worse weakness, fever, vomiting, another fall.

## 2017-12-09 LAB — URINE CULTURE: Culture: >=100000 — AB

## 2017-12-10 ENCOUNTER — Telehealth: Payer: Self-pay | Admitting: *Deleted

## 2017-12-10 NOTE — Telephone Encounter (Signed)
Post ED Visit - Positive Culture Follow-up  Culture report reviewed by antimicrobial stewardship pharmacist:  []  Elenor Quinones, Pharm.D. []  Heide Guile, Pharm.D., BCPS AQ-ID []  Parks Neptune, Pharm.D., BCPS []  Alycia Rossetti, Pharm.D., BCPS []  [place], Pharm.D., BCPS, AAHIVP []  Legrand Como, Pharm.D., BCPS, AAHIVP []  Salome Arnt, PharmD, BCPS []  Johnnette Gourd, PharmD, BCPS []  Hughes Better, PharmD, BCPS []  Leeroy Cha, PharmD Adin Hector, PharmD  Positive urine culture Treated with *Cephalexin organism sensitive to the same and no further patient follow-up is required at this time.  Harlon [facility] 12/10/2017, 10:55 AM

## 2018-02-01 ENCOUNTER — Ambulatory Visit: Payer: Medicare Other | Admitting: Cardiovascular Disease

## 2018-02-25 ENCOUNTER — Ambulatory Visit: Payer: Medicare Other | Admitting: Cardiovascular Disease

## 2018-12-15 ENCOUNTER — Other Ambulatory Visit: Payer: Self-pay | Admitting: Cardiovascular Disease

## 2018-12-21 ENCOUNTER — Other Ambulatory Visit: Payer: Self-pay | Admitting: Cardiovascular Disease

## 2018-12-22 NOTE — Telephone Encounter (Signed)
Rx(s) sent to pharmacy electronically.  

## 2018-12-29 ENCOUNTER — Other Ambulatory Visit: Payer: Self-pay | Admitting: Cardiovascular Disease

## 2018-12-30 ENCOUNTER — Encounter (HOSPITAL_COMMUNITY): Payer: Self-pay | Admitting: Emergency Medicine

## 2018-12-30 ENCOUNTER — Other Ambulatory Visit: Payer: Self-pay

## 2018-12-30 ENCOUNTER — Emergency Department (HOSPITAL_COMMUNITY): Payer: Medicare Other

## 2018-12-30 ENCOUNTER — Inpatient Hospital Stay (HOSPITAL_COMMUNITY)
Admission: EM | Admit: 2018-12-30 | Discharge: 2019-01-07 | DRG: 368 | Disposition: A | Discharge status: Skilled Nursing Facility | Payer: Medicare Other | Attending: Student | Admitting: Student

## 2018-12-30 DIAGNOSIS — N289 Disorder of kidney and ureter, unspecified: Secondary | ICD-10-CM | POA: Diagnosis present

## 2018-12-30 DIAGNOSIS — N390 Urinary tract infection, site not specified: Secondary | ICD-10-CM | POA: Diagnosis present

## 2018-12-30 DIAGNOSIS — E1122 Type 2 diabetes mellitus with diabetic chronic kidney disease: Secondary | ICD-10-CM | POA: Diagnosis present

## 2018-12-30 DIAGNOSIS — D649 Anemia, unspecified: Secondary | ICD-10-CM

## 2018-12-30 DIAGNOSIS — Z9842 Cataract extraction status, left eye: Secondary | ICD-10-CM

## 2018-12-30 DIAGNOSIS — Z9013 Acquired absence of bilateral breasts and nipples: Secondary | ICD-10-CM

## 2018-12-30 DIAGNOSIS — N183 Chronic kidney disease, stage 3 unspecified: Secondary | ICD-10-CM | POA: Diagnosis present

## 2018-12-30 DIAGNOSIS — E669 Obesity, unspecified: Secondary | ICD-10-CM | POA: Diagnosis present

## 2018-12-30 DIAGNOSIS — E119 Type 2 diabetes mellitus without complications: Secondary | ICD-10-CM

## 2018-12-30 DIAGNOSIS — Z853 Personal history of malignant neoplasm of breast: Secondary | ICD-10-CM

## 2018-12-30 DIAGNOSIS — E039 Hypothyroidism, unspecified: Secondary | ICD-10-CM | POA: Diagnosis present

## 2018-12-30 DIAGNOSIS — Z20828 Contact with and (suspected) exposure to other viral communicable diseases: Secondary | ICD-10-CM | POA: Diagnosis present

## 2018-12-30 DIAGNOSIS — R471 Dysarthria and anarthria: Secondary | ICD-10-CM | POA: Diagnosis not present

## 2018-12-30 DIAGNOSIS — Z9989 Dependence on other enabling machines and devices: Secondary | ICD-10-CM

## 2018-12-30 DIAGNOSIS — Z885 Allergy status to narcotic agent status: Secondary | ICD-10-CM

## 2018-12-30 DIAGNOSIS — Z7982 Long term (current) use of aspirin: Secondary | ICD-10-CM

## 2018-12-30 DIAGNOSIS — B961 Klebsiella pneumoniae [K. pneumoniae] as the cause of diseases classified elsewhere: Secondary | ICD-10-CM | POA: Diagnosis present

## 2018-12-30 DIAGNOSIS — I129 Hypertensive chronic kidney disease with stage 1 through stage 4 chronic kidney disease, or unspecified chronic kidney disease: Secondary | ICD-10-CM

## 2018-12-30 DIAGNOSIS — G9341 Metabolic encephalopathy: Secondary | ICD-10-CM | POA: Diagnosis not present

## 2018-12-30 DIAGNOSIS — Z9049 Acquired absence of other specified parts of digestive tract: Secondary | ICD-10-CM

## 2018-12-30 DIAGNOSIS — E785 Hyperlipidemia, unspecified: Secondary | ICD-10-CM | POA: Diagnosis present

## 2018-12-30 DIAGNOSIS — I10 Essential (primary) hypertension: Secondary | ICD-10-CM | POA: Diagnosis present

## 2018-12-30 DIAGNOSIS — Z6830 Body mass index (BMI) 30.0-30.9, adult: Secondary | ICD-10-CM

## 2018-12-30 DIAGNOSIS — S2242XA Multiple fractures of ribs, left side, initial encounter for closed fracture: Secondary | ICD-10-CM | POA: Diagnosis present

## 2018-12-30 DIAGNOSIS — I472 Ventricular tachycardia: Secondary | ICD-10-CM | POA: Diagnosis not present

## 2018-12-30 DIAGNOSIS — Z87891 Personal history of nicotine dependence: Secondary | ICD-10-CM

## 2018-12-30 DIAGNOSIS — K2101 Gastro-esophageal reflux disease with esophagitis, with bleeding: Principal | ICD-10-CM | POA: Diagnosis present

## 2018-12-30 DIAGNOSIS — E041 Nontoxic single thyroid nodule: Secondary | ICD-10-CM | POA: Diagnosis present

## 2018-12-30 DIAGNOSIS — I480 Paroxysmal atrial fibrillation: Secondary | ICD-10-CM | POA: Diagnosis present

## 2018-12-30 DIAGNOSIS — Z79899 Other long term (current) drug therapy: Secondary | ICD-10-CM

## 2018-12-30 DIAGNOSIS — I672 Cerebral atherosclerosis: Secondary | ICD-10-CM | POA: Diagnosis present

## 2018-12-30 DIAGNOSIS — E871 Hypo-osmolality and hyponatremia: Secondary | ICD-10-CM | POA: Diagnosis present

## 2018-12-30 DIAGNOSIS — E162 Hypoglycemia, unspecified: Secondary | ICD-10-CM | POA: Diagnosis not present

## 2018-12-30 DIAGNOSIS — R4701 Aphasia: Secondary | ICD-10-CM | POA: Diagnosis not present

## 2018-12-30 DIAGNOSIS — Y92009 Unspecified place in unspecified non-institutional (private) residence as the place of occurrence of the external cause: Secondary | ICD-10-CM

## 2018-12-30 DIAGNOSIS — E1165 Type 2 diabetes mellitus with hyperglycemia: Secondary | ICD-10-CM | POA: Diagnosis present

## 2018-12-30 DIAGNOSIS — Z90710 Acquired absence of both cervix and uterus: Secondary | ICD-10-CM

## 2018-12-30 DIAGNOSIS — I4819 Other persistent atrial fibrillation: Secondary | ICD-10-CM | POA: Diagnosis present

## 2018-12-30 DIAGNOSIS — E43 Unspecified severe protein-calorie malnutrition: Secondary | ICD-10-CM | POA: Diagnosis present

## 2018-12-30 DIAGNOSIS — D509 Iron deficiency anemia, unspecified: Secondary | ICD-10-CM | POA: Diagnosis present

## 2018-12-30 DIAGNOSIS — Z86010 Personal history of colonic polyps: Secondary | ICD-10-CM

## 2018-12-30 DIAGNOSIS — Z794 Long term (current) use of insulin: Secondary | ICD-10-CM

## 2018-12-30 DIAGNOSIS — Z993 Dependence on wheelchair: Secondary | ICD-10-CM

## 2018-12-30 DIAGNOSIS — G4733 Obstructive sleep apnea (adult) (pediatric): Secondary | ICD-10-CM | POA: Diagnosis present

## 2018-12-30 DIAGNOSIS — Z79890 Hormone replacement therapy (postmenopausal): Secondary | ICD-10-CM

## 2018-12-30 DIAGNOSIS — W010XXA Fall on same level from slipping, tripping and stumbling without subsequent striking against object, initial encounter: Secondary | ICD-10-CM | POA: Diagnosis present

## 2018-12-30 DIAGNOSIS — R54 Age-related physical debility: Secondary | ICD-10-CM | POA: Diagnosis present

## 2018-12-30 DIAGNOSIS — Z9181 History of falling: Secondary | ICD-10-CM

## 2018-12-30 DIAGNOSIS — R29701 NIHSS score 1: Secondary | ICD-10-CM | POA: Diagnosis not present

## 2018-12-30 DIAGNOSIS — C50919 Malignant neoplasm of unspecified site of unspecified female breast: Secondary | ICD-10-CM | POA: Diagnosis present

## 2018-12-30 DIAGNOSIS — Z9841 Cataract extraction status, right eye: Secondary | ICD-10-CM

## 2018-12-30 DIAGNOSIS — R296 Repeated falls: Secondary | ICD-10-CM | POA: Diagnosis present

## 2018-12-30 DIAGNOSIS — K5909 Other constipation: Secondary | ICD-10-CM | POA: Diagnosis present

## 2018-12-30 DIAGNOSIS — Z8249 Family history of ischemic heart disease and other diseases of the circulatory system: Secondary | ICD-10-CM

## 2018-12-30 DIAGNOSIS — M8580 Other specified disorders of bone density and structure, unspecified site: Secondary | ICD-10-CM | POA: Diagnosis present

## 2018-12-30 DIAGNOSIS — E11649 Type 2 diabetes mellitus with hypoglycemia without coma: Secondary | ICD-10-CM | POA: Diagnosis present

## 2018-12-30 DIAGNOSIS — K449 Diaphragmatic hernia without obstruction or gangrene: Secondary | ICD-10-CM

## 2018-12-30 DIAGNOSIS — S2249XA Multiple fractures of ribs, unspecified side, initial encounter for closed fracture: Secondary | ICD-10-CM | POA: Diagnosis present

## 2018-12-30 DIAGNOSIS — D631 Anemia in chronic kidney disease: Secondary | ICD-10-CM | POA: Diagnosis present

## 2018-12-30 LAB — CBC WITH DIFFERENTIAL/PLATELET
Abs Immature Granulocytes: 0.04 10*3/uL (ref 0.00–0.07)
Basophils Absolute: 0 10*3/uL (ref 0.0–0.1)
Basophils Relative: 0 %
Eosinophils Absolute: 0 10*3/uL (ref 0.0–0.5)
Eosinophils Relative: 0 %
HCT: 20 % — ABNORMAL LOW (ref 36.0–46.0)
Hemoglobin: 5.2 g/dL — CL (ref 12.0–15.0)
Immature Granulocytes: 1 %
Lymphocytes Relative: 7 %
Lymphs Abs: 0.5 10*3/uL — ABNORMAL LOW (ref 0.7–4.0)
MCH: 16.4 pg — ABNORMAL LOW (ref 26.0–34.0)
MCHC: 26 g/dL — ABNORMAL LOW (ref 30.0–36.0)
MCV: 62.9 fL — ABNORMAL LOW (ref 80.0–100.0)
Monocytes Absolute: 0.8 10*3/uL (ref 0.1–1.0)
Monocytes Relative: 12 %
Neutro Abs: 5.4 10*3/uL (ref 1.7–7.7)
Neutrophils Relative %: 80 %
Platelets: 402 10*3/uL — ABNORMAL HIGH (ref 150–400)
RBC: 3.18 MIL/uL — ABNORMAL LOW (ref 3.87–5.11)
RDW: 21.3 % — ABNORMAL HIGH (ref 11.5–15.5)
WBC: 6.7 10*3/uL (ref 4.0–10.5)
nRBC: 1.1 % — ABNORMAL HIGH (ref 0.0–0.2)

## 2018-12-30 LAB — COMPREHENSIVE METABOLIC PANEL
ALT: 19 U/L (ref 0–44)
AST: 25 U/L (ref 15–41)
Albumin: 2.7 g/dL — ABNORMAL LOW (ref 3.5–5.0)
Alkaline Phosphatase: 70 U/L (ref 38–126)
Anion gap: 11 (ref 5–15)
BUN: 16 mg/dL (ref 8–23)
CO2: 21 mmol/L — ABNORMAL LOW (ref 22–32)
Calcium: 8 mg/dL — ABNORMAL LOW (ref 8.9–10.3)
Chloride: 99 mmol/L (ref 98–111)
Creatinine, Ser: 1.17 mg/dL — ABNORMAL HIGH (ref 0.44–1.00)
GFR calc Af Amer: 50 mL/min — ABNORMAL LOW (ref >60)
GFR calc non Af Amer: 43 mL/min — ABNORMAL LOW (ref >60)
Glucose, Bld: 130 mg/dL — ABNORMAL HIGH (ref 70–99)
Potassium: 3.7 mmol/L (ref 3.5–5.1)
Sodium: 131 mmol/L — ABNORMAL LOW (ref 135–145)
Total Bilirubin: 1.4 mg/dL — ABNORMAL HIGH (ref 0.3–1.2)
Total Protein: 5.6 g/dL — ABNORMAL LOW (ref 6.5–8.1)

## 2018-12-30 LAB — PREPARE RBC (CROSSMATCH)

## 2018-12-30 LAB — CBG MONITORING, ED
Glucose-Capillary: 132 mg/dL — ABNORMAL HIGH (ref 70–99)
Glucose-Capillary: 128 mg/dL — ABNORMAL HIGH (ref 70–99)

## 2018-12-30 LAB — POC OCCULT BLOOD, ED: Fecal Occult Bld: NEGATIVE

## 2018-12-30 MED ORDER — SODIUM CHLORIDE 0.9% IV SOLUTION: Freq: Once | INTRAVENOUS | Status: DC

## 2018-12-30 MED ORDER — IOHEXOL 300 MG/ML  SOLN
75.0000 mL | Freq: Once | INTRAMUSCULAR | Status: AC | PRN
  Administered 2018-12-30: 75 mL via INTRAVENOUS

## 2018-12-30 NOTE — ED Triage Notes (Signed)
Pt. BIB [facility] from home with home health RN. EMS reports call out r/t weakness and unable to get up. Pt has also had recent falls and decreased appetite today.   Upon EMS arrival pt. Was very lethargic with initial CBG *44. Given 250 ml of D10. Repeat CBG 151.   EMS VS EKG a fib  BP 129/77 HR 100 RR 19 SpO2 99%

## 2018-12-30 NOTE — ED Notes (Signed)
PA at bedside.

## 2018-12-30 NOTE — ED Provider Notes (Signed)
[facility] EMERGENCY DEPARTMENT Provider Note   CSN: [id number] Arrival date & time: 12/30/18  2103     History   Chief Complaint Chief Complaint  Patient presents with   Hypoglycemia    HPI Diana [name] is a 85 y.o. female with PMHx HTN, HLD, hypothyroidism, OSA on CPAP, diabetes, who presents to the ED via EMS for hypoglycemia.  Per triage report EMS was called from home health RN related to weakness and night inability to get up.  Patient reports that she has "been in and out of it all day."  EMS arrived patient was noticed to be lethargic with initial CBG of 44.  She was given 250 mL of D10 with repeat CBG 151.  CBG in the ED 132.  Patient states that she has had a "catch" in her left side for the past couple of days.  She reports that she has been falling quite frequently recently and reports that she fell a couple of days ago and landed onto her left side.  No head injury or loss of consciousness.  She has no other complaints at this time. Denies fever, chills, shortness of breath, abdominal pain, nausea, vomiting, diarrhea, or any other associated symptoms.        Past Medical History:  Diagnosis Date   Anemia    Anemia of decreased vitamin B12 absorption 05/26/2005   Arthritis    Breast cancer ([place]) 2001   Cataract    History of tobacco abuse    Hx of colonic polyps    Hyperlipidemia    Hypertension    Hypothyroidism    Obesity    OSA on CPAP 09/2007   AHI 10.6/hr overall, 43.64/hr during REM   Osteopenia    Renal insufficiency    Type 2 diabetes mellitus ([place])     Patient Active Problem List   Diagnosis Date Noted   HTN (hypertension) 05/15/2013   OSA on CPAP 05/15/2013   Hypothyroidism 05/15/2013   DM2 (diabetes mellitus, type 2) ([place]) 05/15/2013   Lower extremity edema 05/15/2013   Renal insufficiency 05/15/2013   Morbid obesity ([place]) 05/15/2013   Breast cancer ([place]) 03/18/2011    Past Surgical History:   Procedure Laterality Date   BACK SURGERY  05/30/2009   BOWEL RESECTION  1974   CATARACT EXTRACTION Bilateral    bilateral   CHOLECYSTECTOMY  1974   MASTECTOMY Bilateral 2001   bilateral sentinel lymph nodes bio   NM MYOCAR PERF WALL MOTION  06/2007   dipyridamole; perfusion defect in inferior myocardium consistent with diaphragmatic attenuation, remaining myocardium with no ischemia/infarct, EF 73%; normal, low risk scan    TOTAL ABDOMINAL HYSTERECTOMY  1975   TRANSTHORACIC ECHOCARDIOGRAM  02/2010   123456, stage 1 diastolic dysfunction; borderline RV enlargement; LA mild-mod dilated; mild mitral annular calcif & mild MR; mild TR with normal RSVP, AV moderately sclerotics     OB History   No obstetric history on file.      Home Medications    Prior to Admission medications   Medication Sig Start Date End Date Taking? Authorizing Provider  Apoaequorin (PREVAGEN PO) Take 1 capsule by mouth daily.    [provider]  aspirin 81 MG tablet Take 162 mg by mouth daily.     [provider]  calcium-vitamin D (OSCAL WITH D) 500-200 MG-UNIT per tablet Take 2 tablets by mouth.     [provider]  cephALEXin (KEFLEX) 500 MG capsule Take 1 capsule (500 mg  total) by mouth 3 (three) times daily. 12/08/17   Drenda Freeze, MD  cholecalciferol (VITAMIN D) 1000 UNITS tablet Take 1,000 Units by mouth. 2 tabs    [provider]  co-enzyme Q-10 30 MG capsule Take 30 mg by mouth.     [provider]  COMBIGAN 0.2-0.5 % ophthalmic solution Place 1 drop into both eyes every 12 (twelve) hours.  01/13/12   [provider]  Cyanocobalamin (VITAMIN B 12 PO) Take 1 tablet by mouth daily.     [provider]  fish oil-omega-3 fatty acids 1000 MG capsule Take 1 g by mouth daily.     [provider]  FLUoxetine (PROZAC) 20 MG capsule Take 20 mg by mouth daily.     [provider]  furosemide (LASIX) 40 MG tablet Take 20  mg by mouth daily as needed for fluid or edema.     [provider]  glimepiride (AMARYL) 2 MG tablet Take 2 mg by mouth 2 (two) times daily.    [provider]  GLUCOSAMINE-CHONDROITIN PO Take 1 capsule by mouth. 1500/1200     [provider]  insulin glargine (LANTUS) 100 UNIT/ML injection Inject 43 Units into the skin at bedtime. Sliding scale    [provider]  levothyroxine (SYNTHROID, LEVOTHROID) 50 MCG tablet Take 50 mcg by mouth daily.      [provider]  losartan (COZAAR) 100 MG tablet Take 1 tablet (100 mg total) by mouth daily. NEEDS APPOINTMENT FOR FUTURE REFILLS 12/22/18   Troy Sine, MD  magnesium oxide (MAG-OX) 400 MG tablet Take 400 mg by mouth.     [provider]  meclizine (ANTIVERT) 25 MG tablet Take 25 mg by mouth.    [provider]  Multiple Vitamins-Minerals (PRESERVISION AREDS 2) CAPS Take 2 capsules by mouth daily.    [provider]  multivitamin [facility]) per tablet Take 1 tablet by mouth daily.      [provider]  NON FORMULARY CPAP    [provider]  potassium chloride (KLOR-CON) 10 MEQ CR tablet Take 20 mEq by mouth daily as needed (takes when she takes the lasix.).     [provider]  Probiotic Product (PROBIOTIC DAILY PO) Take 1 capsule by mouth daily.    [provider]  rosuvastatin (CRESTOR) 20 MG tablet Take 20 mg by mouth daily.    [provider]  simvastatin (ZOCOR) 20 MG tablet Take 1 tablet (20 mg total) by mouth at bedtime. Patient not taking: Reported on 12/06/2017 10/09/15   Troy Sine, MD  traMADol (ULTRAM) 50 MG tablet Take 1 tablet (50 mg total) by mouth every 6 (six) hours as needed for moderate pain. 12/08/17   Drenda Freeze, MD  verapamil (CALAN-SR) 240 MG CR tablet Take 1 tablet (240 mg total) by mouth at bedtime. Please keep upcoming appt for further refills. 11/05/17   Troy Sine, MD    Family  History Family History  Problem Relation Age of Onset   Heart attack Mother    Heart attack Father    Colon cancer Neg Hx     Social History Social History   Tobacco Use   Smoking status: Former Smoker    Years: 13.00    Types: Cigarettes    Quit date: 05/02/2002    Years since quitting: 16.6   Smokeless tobacco: Never Used  Substance Use Topics   Alcohol use: No    Alcohol/week: 0.0  standard drinks   Drug use: Never     Allergies   Codeine sulfate   Review of Systems Review of Systems  Constitutional: Negative for chills and fever.  Eyes: Negative for visual disturbance.  Respiratory: Negative for shortness of breath.   Cardiovascular: Positive for chest pain (left rib pain).  Gastrointestinal: Negative for abdominal pain, nausea and vomiting.  All other systems reviewed and are negative.    Physical Exam Updated Vital Signs BP (!) 130/94    Pulse 92    Temp 97.9 F (36.6 C) (Oral)    Resp 12    SpO2 100%   Physical Exam Vitals signs and nursing note reviewed.  Constitutional:      Appearance: She is not ill-appearing.  HENT:     Head: Normocephalic and atraumatic.     Comments: No raccoon's sign or battle's sign. Negative hemotympanum bilaterally.  Eyes:     Conjunctiva/sclera: Conjunctivae normal.  Neck:     Musculoskeletal: Neck supple.  Cardiovascular:     Rate and Rhythm: Normal rate. Rhythm irregular.  Pulmonary:     Effort: Pulmonary effort is normal.     Breath sounds: Normal breath sounds. No wheezing, rhonchi or rales.     Comments: Left lateral rib tenderness to palpation Chest:     Chest wall: Tenderness present.  Abdominal:     Palpations: Abdomen is soft.     Tenderness: There is no abdominal tenderness. There is no right CVA tenderness, left CVA tenderness, guarding or rebound.  Skin:    General: Skin is warm and dry.     Coloration: Skin is pale.  Neurological:     Mental Status: She is alert.      ED Treatments /  Results  Labs (all labs ordered are listed, but only abnormal results are displayed) Labs Reviewed  COMPREHENSIVE METABOLIC PANEL - Abnormal; Notable for the following components:      Result Value   Sodium 131 (*)    CO2 21 (*)    Glucose, Bld 130 (*)    Creatinine, Ser 1.17 (*)    Calcium 8.0 (*)    Total Protein 5.6 (*)    Albumin 2.7 (*)    Total Bilirubin 1.4 (*)    GFR calc non Af Amer 43 (*)    GFR calc Af Amer 50 (*)    All other components within normal limits  CBC WITH DIFFERENTIAL/PLATELET - Abnormal; Notable for the following components:   RBC 3.18 (*)    Hemoglobin 5.2 (*)    HCT 20.0 (*)    MCV 62.9 (*)    MCH 16.4 (*)    MCHC 26.0 (*)    RDW 21.3 (*)    Platelets 402 (*)    nRBC 1.1 (*)    Lymphs Abs 0.5 (*)    All other components within normal limits  CBG MONITORING, ED - Abnormal; Notable for the following components:   Glucose-Capillary 132 (*)    All other components within normal limits  CBG MONITORING, ED - Abnormal; Notable for the following components:   Glucose-Capillary 128 (*)    All other components within normal limits  SARS CORONAVIRUS 2 (TAT 6-24 HRS)  URINALYSIS, ROUTINE W REFLEX MICROSCOPIC  OCCULT BLOOD X 1 CARD TO LAB, STOOL  POC OCCULT BLOOD, ED  TYPE AND SCREEN  PREPARE RBC (CROSSMATCH)  ABO/RH    EKG EKG Interpretation  Date/Time:  Thursday December 30 2018 23:37:49 EDT Ventricular Rate:  109 PR Interval:  QRS Duration: 86 QT Interval:  370 QTC Calculation: 499 R Axis:   -12 Text Interpretation:  Atrial fibrillation Abnormal R-wave progression, late transition Probable LVH with secondary repol abnrm Borderline prolonged QT interval Confirmed by Ripley Fraise [phone]) on 12/30/2018 11:52:46 PM   Radiology Dg Ribs Unilateral W/chest Left  Result Date: 12/30/2018 CLINICAL DATA:  Left-sided rib pain fall EXAM: LEFT RIBS AND CHEST - 3+ VIEW COMPARISON:  07/12/2010 FINDINGS: Single-view chest demonstrates no acute  airspace disease. There may be small left pleural effusion. Mild cardiomegaly with aortic atherosclerosis. No pneumothorax. Clips in the right axillary region. Left rib series demonstrates old appearing left fifth and 6 rib fractures. Acute mildly displaced left seventh, eighth, ninth and tenth anterolateral rib fractures. IMPRESSION: 1. Acute displaced left seventh through tenth rib fractures with adjacent small left effusion. 2. Negative for pneumothorax 3. Cardiomegaly Electronically Signed   By: Donavan Foil M.D.   On: 12/30/2018 22:23    Procedures .Critical Care Performed by: Eustaquio Maize, PA-C Authorized by: Eustaquio Maize, PA-C   Critical care provider statement:    Critical care time (minutes):  45   Critical care was time spent personally by me on the following activities:  Discussions with consultants, evaluation of patient's response to treatment, examination of patient, ordering and performing treatments and interventions, ordering and review of laboratory studies, ordering and review of radiographic studies, pulse oximetry, re-evaluation of patient's condition, obtaining history from patient or surrogate and review of old charts   (including critical care time)  Medications Ordered in ED Medications  0.9 %  sodium chloride infusion (Manually program via Guardrails IV Fluids) (has no administration in time range)  iohexol (OMNIPAQUE) 300 MG/ML solution 75 mL (75 mLs Intravenous Contrast Given 12/30/18 2353)     Initial Impression / Assessment and Plan / ED Course  I have reviewed the triage vital signs and the nursing notes.  Pertinent labs & imaging results that were available during my care of the patient were reviewed by me and considered in my medical decision making (see chart for details).  85 year old female presenting to the ED initially for hypoglycemia - CBG 44 with EMS. Already given 250 CCs D10. CBG - 132. Pt does appear pale on exam. She is complaining of some  left rib pain x a couple of days s/p fall. On exam she is quite tender to lateral ribs on left side. Will obtain CXR. Denies head injury. Pt currently does not have any family or caregiver with her at this time; reports they are on their way. Hoping to get more information when she returns. Will obtain baseline screening labs as well.   Caregiver at bedside - reports that she sees patient every other day.  Reports frequent falls recently and does admit to the left rib pain.  She reports that EMS evaluated patient and did not find anything acute; pt declined coming to the ED after her fall several days ago.   Caregiver went to see patient today and noted that she seemed much weaker than normal; reports able to typically transfer on her own from bed to wheelchair and get around on her own but she was not able to do that today. Pt does normally take her own medications - states she has not had much of an appetite recently but has still been taking all of her medications including her glimepiride.   Xray positive for displaced rib fractures 7th-10th on left; given this will obtain CT Head, CT  neck, and CT chest as pt likely had an impressive fall and could have other fractures.   Prior to going to CT scan nursing staff informed that pt's Hgb 5.2; will transfuse at this time. Will add on CT A/P as there could be concern for bleeding internally s/2 to injuries. FOBT negative. Will consult trauma surgery at this time.   Have discussed case with Dr. Grandville Silos with trauma; he will evaluate patient and await CT scans.   CT scans with findings of rib fracture 6th-9th on left side; no other acute findings. Does not appear to be bleeding internally.   Discussed case with Dr. Grandville Silos who will consult on patient but recommends medicine admission to work up anemia of unknown origin.   12:49 AM At shift change case signed out to Charlann Lange, PA-C, who will consult medicine for admission.   Clinical Course as of  Dec 30 29  Thu Dec 30, 2018  2222 Patient was seen by myself as well as PA provider.  Briefly this is an 85 year old female presents to emergency department with hypoglycemia and recurrent falls.  History is provided in part by her caretaker at the bedside.  Her caretaker reports the patient has had multiple falls in the past week.  The patient lives by herself and only has a caretaker and family stop by to visit.  Today they were concerned that the patient again fallen.  The patient reports that she feels generally weak.  She denies any headache.  She denies any cough congestion or fevers.  She denies any dysuria abdominal pain or diarrhea.  She says she feels tired.  She reports that she takes her medications.  This includes glimepiride which she takes once daily 2 mg.  My exam the patient appears tired.  She appears pale.  She is mildly tachycardic.  She was noted by EMS to have a blood sugar in the 40s and did get glucose from them.  Here her blood sugar is 132.  Differential is broad and includes recurrent hypoglycemia which may be medication induced with her glimepiride, versus anemia, versus infection, versus other.  Will obtain labs and work-up.    [MT]  2235 Multiple rib fractures, will consult trauma, obtain further imaging of head, c-spine, chest   [MT]  2235 Afebrile, lower suspicion for infection at this time   [MT]  2324 Hemoglobin(!!): 5.2 [MT]  2330 Trauma consulted, pending CT imaging to evaluate for internal bleeding.  2 units pRBC ordered   [MT]    Clinical Course User Index [MT] Wyvonnia Dusky, MD         Final Clinical Impressions(s) / ED Diagnoses   Final diagnoses:  Hypoglycemia  Closed fracture of multiple ribs of left side, initial encounter  Anemia, unspecified type    ED Discharge Orders    None       Eustaquio Maize, PA-C 12/31/18 0050    Wyvonnia Dusky, MD 12/31/18 [phone]

## 2018-12-30 NOTE — ED Notes (Signed)
Patient given sandwich and orange juice for po intake.

## 2018-12-31 ENCOUNTER — Emergency Department (HOSPITAL_COMMUNITY): Payer: Medicare Other

## 2018-12-31 ENCOUNTER — Observation Stay (HOSPITAL_BASED_OUTPATIENT_CLINIC_OR_DEPARTMENT_OTHER): Payer: Medicare Other

## 2018-12-31 ENCOUNTER — Encounter (HOSPITAL_COMMUNITY): Payer: Self-pay | Admitting: Internal Medicine

## 2018-12-31 DIAGNOSIS — S2242XA Multiple fractures of ribs, left side, initial encounter for closed fracture: Secondary | ICD-10-CM | POA: Diagnosis not present

## 2018-12-31 DIAGNOSIS — G4733 Obstructive sleep apnea (adult) (pediatric): Secondary | ICD-10-CM | POA: Diagnosis not present

## 2018-12-31 DIAGNOSIS — Z9989 Dependence on other enabling machines and devices: Secondary | ICD-10-CM

## 2018-12-31 DIAGNOSIS — E162 Hypoglycemia, unspecified: Secondary | ICD-10-CM

## 2018-12-31 DIAGNOSIS — I4891 Unspecified atrial fibrillation: Secondary | ICD-10-CM

## 2018-12-31 DIAGNOSIS — D649 Anemia, unspecified: Secondary | ICD-10-CM

## 2018-12-31 DIAGNOSIS — I10 Essential (primary) hypertension: Secondary | ICD-10-CM

## 2018-12-31 DIAGNOSIS — S2249XA Multiple fractures of ribs, unspecified side, initial encounter for closed fracture: Secondary | ICD-10-CM

## 2018-12-31 LAB — FERRITIN: Ferritin: 14 ng/mL (ref 11–307)

## 2018-12-31 LAB — FOLATE: Folate: 10.4 ng/mL (ref >5.9)

## 2018-12-31 LAB — IRON AND TIBC
Iron: 42 ug/dL (ref 28–170)
Saturation Ratios: 13 % (ref 10.4–31.8)
TIBC: 325 ug/dL (ref 250–450)
UIBC: 283 ug/dL

## 2018-12-31 LAB — CBG MONITORING, ED
Glucose-Capillary: 152 mg/dL — ABNORMAL HIGH (ref 70–99)
Glucose-Capillary: 81 mg/dL (ref 70–99)
Glucose-Capillary: 191 mg/dL — ABNORMAL HIGH (ref 70–99)

## 2018-12-31 LAB — RETICULOCYTES
Immature Retic Fract: 18.8 % — ABNORMAL HIGH (ref 2.3–15.9)
RBC.: 4.33 MIL/uL (ref 3.87–5.11)
Retic Count, Absolute: 44.6 10*3/uL (ref 19.0–186.0)
Retic Ct Pct: 1 % (ref 0.4–3.1)

## 2018-12-31 LAB — CBC
HCT: 30.4 % — ABNORMAL LOW (ref 36.0–46.0)
Hemoglobin: 8.8 g/dL — ABNORMAL LOW (ref 12.0–15.0)
MCH: 20.2 pg — ABNORMAL LOW (ref 26.0–34.0)
MCHC: 28.9 g/dL — ABNORMAL LOW (ref 30.0–36.0)
MCV: 69.7 fL — ABNORMAL LOW (ref 80.0–100.0)
Platelets: 379 10*3/uL (ref 150–400)
RBC: 4.36 MIL/uL (ref 3.87–5.11)
RDW: 25.7 % — ABNORMAL HIGH (ref 11.5–15.5)
WBC: 8.8 10*3/uL (ref 4.0–10.5)
nRBC: 2 % — ABNORMAL HIGH (ref 0.0–0.2)

## 2018-12-31 LAB — SARS CORONAVIRUS 2 (TAT 6-24 HRS): SARS Coronavirus 2: NEGATIVE

## 2018-12-31 LAB — COMPREHENSIVE METABOLIC PANEL
ALT: 18 U/L (ref 0–44)
AST: 18 U/L (ref 15–41)
Albumin: 2.6 g/dL — ABNORMAL LOW (ref 3.5–5.0)
Alkaline Phosphatase: 74 U/L (ref 38–126)
Anion gap: 11 (ref 5–15)
BUN: 19 mg/dL (ref 8–23)
CO2: 22 mmol/L (ref 22–32)
Calcium: 7.9 mg/dL — ABNORMAL LOW (ref 8.9–10.3)
Chloride: 100 mmol/L (ref 98–111)
Creatinine, Ser: 1.43 mg/dL — ABNORMAL HIGH (ref 0.44–1.00)
GFR calc Af Amer: 39 mL/min — ABNORMAL LOW (ref >60)
GFR calc non Af Amer: 34 mL/min — ABNORMAL LOW (ref >60)
Glucose, Bld: 209 mg/dL — ABNORMAL HIGH (ref 70–99)
Potassium: 3.8 mmol/L (ref 3.5–5.1)
Sodium: 133 mmol/L — ABNORMAL LOW (ref 135–145)
Total Bilirubin: 1.7 mg/dL — ABNORMAL HIGH (ref 0.3–1.2)
Total Protein: 5.6 g/dL — ABNORMAL LOW (ref 6.5–8.1)

## 2018-12-31 LAB — BRAIN NATRIURETIC PEPTIDE: B Natriuretic Peptide: 613.3 pg/mL — ABNORMAL HIGH (ref 0.0–100.0)

## 2018-12-31 LAB — HEMOGLOBIN A1C
Hgb A1c MFr Bld: 5.2 % (ref 4.8–5.6)
Mean Plasma Glucose: 102.54 mg/dL

## 2018-12-31 LAB — VITAMIN B12: Vitamin B-12: 1040 pg/mL — ABNORMAL HIGH (ref 180–914)

## 2018-12-31 LAB — GLUCOSE, CAPILLARY
Glucose-Capillary: 254 mg/dL — ABNORMAL HIGH (ref 70–99)
Glucose-Capillary: 259 mg/dL — ABNORMAL HIGH (ref 70–99)

## 2018-12-31 LAB — ECHOCARDIOGRAM COMPLETE

## 2018-12-31 LAB — TSH: TSH: 4.185 u[IU]/mL (ref 0.350–4.500)

## 2018-12-31 LAB — ABO/RH: ABO/RH(D): A POS

## 2018-12-31 MED ORDER — SODIUM CHLORIDE 0.9 % IV SOLN
INTRAVENOUS | Status: DC
  Administered 2018-12-31: 19:00:00 via INTRAVENOUS

## 2018-12-31 MED ORDER — ONDANSETRON HCL 4 MG PO TABS: 4.0000 mg | ORAL_TABLET | Freq: Four times a day (QID) | ORAL | Status: DC | PRN

## 2018-12-31 MED ORDER — ROSUVASTATIN CALCIUM 20 MG PO TABS
20.0000 mg | ORAL_TABLET | Freq: Every day | ORAL | Status: DC
  Administered 2018-12-31 – 2019-01-07 (×7): 20 mg via ORAL
  Filled 2018-12-31 (×7): qty 1

## 2018-12-31 MED ORDER — SODIUM CHLORIDE 0.9% IV SOLUTION: Freq: Once | INTRAVENOUS | Status: DC

## 2018-12-31 MED ORDER — ONDANSETRON HCL 4 MG/2ML IJ SOLN: 4.0000 mg | Freq: Four times a day (QID) | INTRAMUSCULAR | Status: DC | PRN

## 2018-12-31 MED ORDER — BRIMONIDINE TARTRATE 0.2 % OP SOLN
1.0000 [drp] | Freq: Two times a day (BID) | OPHTHALMIC | Status: DC
  Administered 2018-12-31 – 2019-01-07 (×14): 1 [drp] via OPHTHALMIC
  Filled 2018-12-31 (×2): qty 5

## 2018-12-31 MED ORDER — ACETAMINOPHEN 650 MG RE SUPP: 650.0000 mg | Freq: Four times a day (QID) | RECTAL | Status: DC | PRN

## 2018-12-31 MED ORDER — TIMOLOL MALEATE 0.5 % OP SOLN
1.0000 [drp] | Freq: Two times a day (BID) | OPHTHALMIC | Status: DC
  Administered 2018-12-31 – 2019-01-07 (×14): 1 [drp] via OPHTHALMIC
  Filled 2018-12-31: qty 5

## 2018-12-31 MED ORDER — LEVOTHYROXINE SODIUM 50 MCG PO TABS
50.0000 ug | ORAL_TABLET | Freq: Every day | ORAL | Status: DC
  Administered 2018-12-31 – 2019-01-07 (×8): 50 ug via ORAL
  Filled 2018-12-31 (×8): qty 1

## 2018-12-31 MED ORDER — ACETAMINOPHEN 325 MG PO TABS: 650.0000 mg | ORAL_TABLET | Freq: Four times a day (QID) | ORAL | Status: DC | PRN

## 2018-12-31 MED ORDER — LOSARTAN POTASSIUM 50 MG PO TABS
100.0000 mg | ORAL_TABLET | Freq: Every day | ORAL | Status: DC
  Administered 2018-12-31: 100 mg via ORAL
  Filled 2018-12-31: qty 2

## 2018-12-31 MED ORDER — POLYETHYLENE GLYCOL 3350 17 G PO PACK
17.0000 g | PACK | Freq: Every day | ORAL | Status: DC
  Administered 2018-12-31 – 2019-01-03 (×3): 17 g via ORAL
  Filled 2018-12-31 (×2): qty 1

## 2018-12-31 MED ORDER — FLUOXETINE HCL 20 MG PO CAPS
20.0000 mg | ORAL_CAPSULE | Freq: Every day | ORAL | Status: DC
  Administered 2018-12-31 – 2019-01-07 (×7): 20 mg via ORAL
  Filled 2018-12-31 (×7): qty 1

## 2018-12-31 MED ORDER — VERAPAMIL HCL ER 240 MG PO TBCR
240.0000 mg | EXTENDED_RELEASE_TABLET | Freq: Every day | ORAL | Status: DC
  Administered 2018-12-31 – 2019-01-01 (×2): 240 mg via ORAL
  Filled 2018-12-31 (×3): qty 1

## 2018-12-31 MED ORDER — BRIMONIDINE TARTRATE-TIMOLOL 0.2-0.5 % OP SOLN: 1.0000 [drp] | Freq: Two times a day (BID) | OPHTHALMIC | Status: DC

## 2018-12-31 NOTE — H&P (Signed)
History and Physical    Diana [name] [id number] DOB: Feb 29, 1934 DOA: 12/30/2018  PCP: [name], PA-C  Patient coming from: Home.  Chief Complaint: Fall.  HPI: Diana [name] is a 85 y.o. female with history of diabetes mellitus type 2 on insulin, hypertension, hypothyroidism, history of breast cancer in remission, sleep apnea had a fall at home and was found to be very weak and patient states she called her neighbor and eventually EMS was called.  On arrival patient was found to be weak and mildly confused and when EMS checked her blood sugar it was around 44 patient was given D50 following which blood sugar improved more than 100 and patient was brought to the ER.  Patient states she has been having frequent falls recently most of the time because she feels weak.  Not sure if she lost consciousness.  Denies hitting her head.  During one of the falls 2 days ago she had a left side of the chest and since been hurting her.  Denies any difficulty breathing of any nausea vomiting or diarrhea but has been having some epigastric discomfort with a poor appetite last few weeks.  ED Course: In the ER patient was afebrile.  Labs show creatinine 1.1 glucose 130 albumin 2.7 total protein 5.6 hemoglobin was 5.2 WBC 6.7 platelets 402.  COVID-19 test was negative stool for occult blood was negative.  Patient had CT head CT C-spine CT chest CT abdomen and pelvis which showed left-sided rib fracture with some continuation of the left chest wall.  Trauma service was consulted.  Given the multiple medical issues including severe anemia hypoglycemic episodes patient is being admitted under medical service.  EKG shows A. fib rate controlled.  This happened to be new.  Review of Systems: As per HPI, rest all negative.   Past Medical History:  Diagnosis Date   Anemia    Anemia of decreased vitamin B12 absorption 05/26/2005   Arthritis    Breast cancer ([place]) 2001   Cataract     History of tobacco abuse    Hx of colonic polyps    Hyperlipidemia    Hypertension    Hypothyroidism    Obesity    OSA on CPAP 09/2007   AHI 10.6/hr overall, 43.64/hr during REM   Osteopenia    Renal insufficiency    Type 2 diabetes mellitus ([place])     Past Surgical History:  Procedure Laterality Date   BACK SURGERY  05/30/2009   BOWEL RESECTION  1974   CATARACT EXTRACTION Bilateral    bilateral   CHOLECYSTECTOMY  1974   MASTECTOMY Bilateral 2001   bilateral sentinel lymph nodes bio   NM MYOCAR PERF WALL MOTION  06/2007   dipyridamole; perfusion defect in inferior myocardium consistent with diaphragmatic attenuation, remaining myocardium with no ischemia/infarct, EF 73%; normal, low risk scan    TOTAL ABDOMINAL HYSTERECTOMY  1975   TRANSTHORACIC ECHOCARDIOGRAM  02/2010   123456, stage 1 diastolic dysfunction; borderline RV enlargement; LA mild-mod dilated; mild mitral annular calcif & mild MR; mild TR with normal RSVP, AV moderately sclerotics     reports that she quit smoking about 16 years ago. Her smoking use included cigarettes. She quit after 13.00 years of use. She has never used smokeless tobacco. She reports that she does not drink alcohol or use drugs.  Allergies  Allergen Reactions   Codeine Sulfate Nausea Only    Family History  Problem Relation Age of Onset   Heart attack Mother  Heart attack Father    Colon cancer Neg Hx     Prior to Admission medications   Medication Sig Start Date End Date Taking? Authorizing Provider  Apoaequorin (PREVAGEN PO) Take 1 capsule by mouth daily.    [provider]  aspirin 81 MG tablet Take 162 mg by mouth daily.     [provider]  calcium-vitamin D (OSCAL WITH D) 500-200 MG-UNIT per tablet Take 2 tablets by mouth.     [provider]  cephALEXin (KEFLEX) 500 MG capsule Take 1 capsule (500 mg total) by mouth 3 (three) times daily. 12/08/17   Drenda Freeze, MD    cholecalciferol (VITAMIN D) 1000 UNITS tablet Take 1,000 Units by mouth. 2 tabs    [provider]  co-enzyme Q-10 30 MG capsule Take 30 mg by mouth.     [provider]  COMBIGAN 0.2-0.5 % ophthalmic solution Place 1 drop into both eyes every 12 (twelve) hours.  01/13/12   [provider]  Cyanocobalamin (VITAMIN B 12 PO) Take 1 tablet by mouth daily.     [provider]  fish oil-omega-3 fatty acids 1000 MG capsule Take 1 g by mouth daily.     [provider]  FLUoxetine (PROZAC) 20 MG capsule Take 20 mg by mouth daily.     [provider]  furosemide (LASIX) 40 MG tablet Take 20 mg by mouth daily as needed for fluid or edema.     [provider]  glimepiride (AMARYL) 2 MG tablet Take 2 mg by mouth 2 (two) times daily.    [provider]  GLUCOSAMINE-CHONDROITIN PO Take 1 capsule by mouth. 1500/1200     [provider]  insulin glargine (LANTUS) 100 UNIT/ML injection Inject 43 Units into the skin at bedtime. Sliding scale    [provider]  levothyroxine (SYNTHROID, LEVOTHROID) 50 MCG tablet Take 50 mcg by mouth daily.      [provider]  losartan (COZAAR) 100 MG tablet Take 1 tablet (100 mg total) by mouth daily. NEEDS APPOINTMENT FOR FUTURE REFILLS 12/22/18   Troy Sine, MD  magnesium oxide (MAG-OX) 400 MG tablet Take 400 mg by mouth.     [provider]  meclizine (ANTIVERT) 25 MG tablet Take 25 mg by mouth.    [provider]  Multiple Vitamins-Minerals (PRESERVISION AREDS 2) CAPS Take 2 capsules by mouth daily.    [provider]  multivitamin [facility]) per tablet Take 1 tablet by mouth daily.      [provider]  NON FORMULARY CPAP    [provider]  potassium chloride (KLOR-CON) 10 MEQ CR tablet Take 20 mEq by mouth daily as needed (takes when she takes the lasix.).     [provider]  Probiotic Product (PROBIOTIC DAILY PO)  Take 1 capsule by mouth daily.    [provider]  rosuvastatin (CRESTOR) 20 MG tablet Take 20 mg by mouth daily.    [provider]  simvastatin (ZOCOR) 20 MG tablet Take 1 tablet (20 mg total) by mouth at bedtime. Patient not taking: Reported on 12/06/2017 10/09/15   Troy Sine, MD  traMADol (ULTRAM) 50 MG tablet Take 1 tablet (50 mg total) by mouth every 6 (six) hours as needed for moderate pain. 12/08/17   Drenda Freeze, MD  verapamil (CALAN-SR) 240 MG CR tablet Take 1 tablet (240 mg total) by mouth at bedtime. Please keep upcoming appt for further refills. 11/05/17  Troy Sine, MD    Physical Exam: Constitutional: Moderately built and nourished. Vitals:   12/31/18 0050 12/31/18 0105 12/31/18 0145 12/31/18 0250  BP: (!) 130/56 (!) 148/85 (!) 142/82 (!) 163/85  Pulse: 100 95 99 86  Resp: 16 18 20 16   Temp: 98.1 F (36.7 C) 98.5 F (36.9 C)  98.3 F (36.8 C)  TempSrc: Oral Oral  Oral  SpO2: 99% 97% 99% 100%   Eyes: Anicteric no pallor. ENMT: No discharge from the ears eyes nose or mouth. Neck: No mass felt.  No neck rigidity. Respiratory: No rhonchi or crepitations. Cardiovascular: S1 S2 heard. Abdomen: Soft nontender bowel sound present. Musculoskeletal: Some ecchymotic areas seen. Skin: Some ecchymotic areas seen. Neurologic: Alert awake oriented to time place and person.  Moves all extremities. Psychiatric: Appears normal.   Labs on Admission: I have personally reviewed following labs and imaging studies  CBC: Recent Labs  Lab 12/30/18 2246  WBC 6.7  NEUTROABS 5.4  HGB 5.2*  HCT 20.0*  MCV 62.9*  PLT [id number]*   Basic Metabolic Panel: Recent Labs  Lab 12/30/18 2246  NA 131*  K 3.7  CL 99  CO2 21*  GLUCOSE 130*  BUN 16  CREATININE 1.17*  CALCIUM 8.0*   GFR: CrCl cannot be calculated (Unknown ideal weight.). Liver Function Tests: Recent Labs  Lab 12/30/18 2246  AST 25  ALT 19  ALKPHOS 70  BILITOT 1.4*  PROT 5.6*    ALBUMIN 2.7*   No results for input(s): LIPASE, AMYLASE in the last 168 hours. No results for input(s): AMMONIA in the last 168 hours. Coagulation Profile: No results for input(s): INR, PROTIME in the last 168 hours. Cardiac Enzymes: No results for input(s): CKTOTAL, CKMB, CKMBINDEX, TROPONINI in the last 168 hours. BNP (last 3 results) No results for input(s): PROBNP in the last 8760 hours. HbA1C: No results for input(s): HGBA1C in the last 72 hours. CBG: Recent Labs  Lab 12/30/18 2108 12/30/18 2322 12/31/18 0241  GLUCAP 132* 128* 152*   Lipid Profile: No results for input(s): CHOL, HDL, LDLCALC, TRIG, CHOLHDL, LDLDIRECT in the last 72 hours. Thyroid Function Tests: No results for input(s): TSH, T4TOTAL, FREET4, T3FREE, THYROIDAB in the last 72 hours. Anemia Panel: No results for input(s): VITAMINB12, FOLATE, FERRITIN, TIBC, IRON, RETICCTPCT in the last 72 hours. Urine analysis:    Component Value Date/Time   COLORURINE YELLOW 12/07/2017 0021   APPEARANCEUR CLEAR 12/07/2017 0021   LABSPEC 1.030 12/07/2017 0021   PHURINE 6.0 12/07/2017 0021   GLUCOSEU NEGATIVE 12/07/2017 0021   HGBUR MODERATE (A) 12/07/2017 0021   BILIRUBINUR NEGATIVE 12/07/2017 0021   KETONESUR NEGATIVE 12/07/2017 0021   PROTEINUR 30 (A) 12/07/2017 0021   UROBILINOGEN 0.2 07/05/2009 0331   NITRITE POSITIVE (A) 12/07/2017 0021   LEUKOCYTESUR SMALL (A) 12/07/2017 0021   Sepsis Labs: @LABRCNTIP (procalcitonin:4,lacticidven:4) )No results found for this or any previous visit (from the past 240 hour(s)).   Radiological Exams on Admission: Dg Ribs Unilateral W/chest Left  Result Date: 12/30/2018 CLINICAL DATA:  Left-sided rib pain fall EXAM: LEFT RIBS AND CHEST - 3+ VIEW COMPARISON:  07/12/2010 FINDINGS: Single-view chest demonstrates no acute airspace disease. There may be small left pleural effusion. Mild cardiomegaly with aortic atherosclerosis. No pneumothorax. Clips in the right axillary region.  Left rib series demonstrates old appearing left fifth and 6 rib fractures. Acute mildly displaced left seventh, eighth, ninth and tenth anterolateral rib fractures. IMPRESSION: 1. Acute displaced left seventh through tenth rib fractures with adjacent small  left effusion. 2. Negative for pneumothorax 3. Cardiomegaly Electronically Signed   By: Donavan Foil M.D.   On: 12/30/2018 22:23   Ct Head Wo Contrast  Result Date: 12/31/2018 CLINICAL DATA:  Head trauma, ataxia EXAM: CT HEAD, cervical spine WITHOUT CONTRAST TECHNIQUE: Contiguous axial images were obtained from the base of the skull through the vertex, cervical spine without intravenous contrast. COMPARISON:  December 06, 2017 FINDINGS: Brain: No evidence of acute territorial infarction, hemorrhage, hydrocephalus,extra-axial collection or mass lesion/mass effect. There is dilatation the ventricles and sulci consistent with age-related atrophy. Low-attenuation changes in the deep white matter consistent with small vessel ischemia. Vascular: No hyperdense vessel or unexpected calcification. Skull: The skull is intact. No fracture or focal lesion identified. Sinuses/Orbits: The visualized paranasal sinuses are clear. There is fluid within the right mastoid air cell. The orbits and globes intact. Other: Unchanged small 1 cm in right parotid space nodule again identified. Cervical spine: Alignment: Physiologic Skull base and vertebrae: Visualized skull base is intact. No atlanto-occipital dissociation. The vertebral body heights are well maintained. No fracture or pathologic osseous lesion seen. Soft tissues and spinal canal: The visualized paraspinal soft tissues are unremarkable. No prevertebral soft tissue swelling is seen. The spinal canal is grossly unremarkable, no large epidural collection or significant canal narrowing. Disc levels: Mild disc height loss with disc osteophyte and uncovertebral osteophytes is most notable at C6-C7. Upper chest: Again noted  is a calcified nodule within the right thyroid lobe. There is chronic left-sided pleural thickening with a posterior fourth healed rib fracture deformity. Other: None IMPRESSION: No acute intracranial abnormality. Findings consistent with age related atrophy and chronic small vessel ischemia Fluid within the right mastoid air cell as on prior exam. No acute fracture or malalignment of the spine. Electronically Signed   By: Prudencio Pair M.D.   On: 12/31/2018 00:42   Ct Chest W Contrast  Result Date: 12/31/2018 CLINICAL DATA:  Fall with left-sided pain. Rib fractures on radiograph. EXAM: CT CHEST, ABDOMEN, AND PELVIS WITH CONTRAST TECHNIQUE: Multidetector CT imaging of the chest, abdomen and pelvis was performed following the standard protocol during bolus administration of intravenous contrast. CONTRAST:  41mL OMNIPAQUE IOHEXOL 300 MG/ML  SOLN COMPARISON:  Rib radiographs yesterday. Chest abdomen pelvis CT 12/06/2017 FINDINGS: CT CHEST FINDINGS Cardiovascular: No acute vascular injury. Aortic atherosclerosis and tortuosity. Normal heart size with coronary artery calcifications. Small pericardial effusion. Mediastinum/Nodes: No mediastinal hemorrhage or hematoma. No pneumomediastinum. Moderate hiatal hernia with fluid-filled esophagus. Peripherally calcified right thyroid nodule. No adenopathy. Lungs/Pleura: Small left pleural effusion and adjacent compressive atelectasis. No pneumothorax. Minor dependent atelectasis in the right lower lobe with trace right pleural effusion. Lungs are otherwise clear. Trachea and mainstem bronchi are patent. Musculoskeletal: Motion artifact partially obscures the known left anterior sixth through ninth rib fractures (reported as 7 through tenth rib fractures on radiograph). No radiographically occult rib fracture. No fracture of the sternum or included clavicles and shoulder girdles. No acute fracture of the lumbar spine. Vertebral body hemangioma within T11. Subcutaneous  contusion involving the left lateral chest wall. No subcutaneous emphysema. CT ABDOMEN PELVIS FINDINGS Hepatobiliary: No hepatic injury or perihepatic hematoma. Gallbladder is surgically absent. Pancreas: No ductal dilatation or inflammation. Parenchymal atrophy without evidence of injury. Spleen: No splenic injury or perisplenic hematoma. Adrenals/Urinary Tract: No adrenal hemorrhage or renal injury identified. Multiple bilateral renal cysts. Absent renal excretion on delayed phase imaging suggest renal dysfunction. Possible solid lesion in the mid left kidney measuring 16 mm, series 3, image  62, not significantly changed from prior. Bladder is unremarkable. Stomach/Bowel: Moderate to large hiatal hernia. No evidence of bowel injury. No mesenteric hematoma. No bowel wall thickening. Distal colonic diverticulosis without diverticulitis. Appendix not confidently visualized. Vascular/Lymphatic: No vascular injury. Aorto bi-iliac atherosclerosis and tortuosity. No retroperitoneal fluid. The IVC is intact. Portal vein is patent. No suspicious adenopathy. Reproductive: Status post hysterectomy. No adnexal masses. Other: No free air or free fluid. Mild patchy contusion in the left flank. Musculoskeletal: Chronic L1 compression fracture with vertebral augmentation. Hemangioma within L3 vertebral body. Multilevel degenerative change in the spine. Bony pelvis is intact. Degenerative change of the pubic symphysis. 1 IMPRESSION: 1. Minimally displaced left anterior sixth through ninth rib fractures. Small left pleural effusion and adjacent compressive atelectasis. No pneumothorax. 2. Subcutaneous contusion of the left lateral chest wall. 3. No additional acute traumatic injury to the chest, abdomen, or pelvis. 4. Moderate to large hiatal hernia with fluid-filled esophagus. Colonic diverticulosis. 5. Absent renal excretion on delayed phase imaging suggests renal dysfunction. Indeterminate lesion in the left mid kidney measuring  16 mm is unchanged from CT 1 year ago. Aortic Atherosclerosis (ICD10-I70.0). Electronically Signed   By: Keith Rake M.D.   On: 12/31/2018 00:37   Ct Cervical Spine Wo Contrast  Result Date: 12/31/2018 CLINICAL DATA:  Head trauma, ataxia EXAM: CT HEAD, cervical spine WITHOUT CONTRAST TECHNIQUE: Contiguous axial images were obtained from the base of the skull through the vertex, cervical spine without intravenous contrast. COMPARISON:  December 06, 2017 FINDINGS: Brain: No evidence of acute territorial infarction, hemorrhage, hydrocephalus,extra-axial collection or mass lesion/mass effect. There is dilatation the ventricles and sulci consistent with age-related atrophy. Low-attenuation changes in the deep white matter consistent with small vessel ischemia. Vascular: No hyperdense vessel or unexpected calcification. Skull: The skull is intact. No fracture or focal lesion identified. Sinuses/Orbits: The visualized paranasal sinuses are clear. There is fluid within the right mastoid air cell. The orbits and globes intact. Other: Unchanged small 1 cm in right parotid space nodule again identified. Cervical spine: Alignment: Physiologic Skull base and vertebrae: Visualized skull base is intact. No atlanto-occipital dissociation. The vertebral body heights are well maintained. No fracture or pathologic osseous lesion seen. Soft tissues and spinal canal: The visualized paraspinal soft tissues are unremarkable. No prevertebral soft tissue swelling is seen. The spinal canal is grossly unremarkable, no large epidural collection or significant canal narrowing. Disc levels: Mild disc height loss with disc osteophyte and uncovertebral osteophytes is most notable at C6-C7. Upper chest: Again noted is a calcified nodule within the right thyroid lobe. There is chronic left-sided pleural thickening with a posterior fourth healed rib fracture deformity. Other: None IMPRESSION: No acute intracranial abnormality. Findings  consistent with age related atrophy and chronic small vessel ischemia Fluid within the right mastoid air cell as on prior exam. No acute fracture or malalignment of the spine. Electronically Signed   By: Prudencio Pair M.D.   On: 12/31/2018 00:42   Ct Abdomen Pelvis W Contrast  Result Date: 12/31/2018 CLINICAL DATA:  Fall with left-sided pain. Rib fractures on radiograph. EXAM: CT CHEST, ABDOMEN, AND PELVIS WITH CONTRAST TECHNIQUE: Multidetector CT imaging of the chest, abdomen and pelvis was performed following the standard protocol during bolus administration of intravenous contrast. CONTRAST:  49mL OMNIPAQUE IOHEXOL 300 MG/ML  SOLN COMPARISON:  Rib radiographs yesterday. Chest abdomen pelvis CT 12/06/2017 FINDINGS: CT CHEST FINDINGS Cardiovascular: No acute vascular injury. Aortic atherosclerosis and tortuosity. Normal heart size with coronary artery calcifications. Small pericardial effusion.  Mediastinum/Nodes: No mediastinal hemorrhage or hematoma. No pneumomediastinum. Moderate hiatal hernia with fluid-filled esophagus. Peripherally calcified right thyroid nodule. No adenopathy. Lungs/Pleura: Small left pleural effusion and adjacent compressive atelectasis. No pneumothorax. Minor dependent atelectasis in the right lower lobe with trace right pleural effusion. Lungs are otherwise clear. Trachea and mainstem bronchi are patent. Musculoskeletal: Motion artifact partially obscures the known left anterior sixth through ninth rib fractures (reported as 7 through tenth rib fractures on radiograph). No radiographically occult rib fracture. No fracture of the sternum or included clavicles and shoulder girdles. No acute fracture of the lumbar spine. Vertebral body hemangioma within T11. Subcutaneous contusion involving the left lateral chest wall. No subcutaneous emphysema. CT ABDOMEN PELVIS FINDINGS Hepatobiliary: No hepatic injury or perihepatic hematoma. Gallbladder is surgically absent. Pancreas: No ductal  dilatation or inflammation. Parenchymal atrophy without evidence of injury. Spleen: No splenic injury or perisplenic hematoma. Adrenals/Urinary Tract: No adrenal hemorrhage or renal injury identified. Multiple bilateral renal cysts. Absent renal excretion on delayed phase imaging suggest renal dysfunction. Possible solid lesion in the mid left kidney measuring 16 mm, series 3, image 62, not significantly changed from prior. Bladder is unremarkable. Stomach/Bowel: Moderate to large hiatal hernia. No evidence of bowel injury. No mesenteric hematoma. No bowel wall thickening. Distal colonic diverticulosis without diverticulitis. Appendix not confidently visualized. Vascular/Lymphatic: No vascular injury. Aorto bi-iliac atherosclerosis and tortuosity. No retroperitoneal fluid. The IVC is intact. Portal vein is patent. No suspicious adenopathy. Reproductive: Status post hysterectomy. No adnexal masses. Other: No free air or free fluid. Mild patchy contusion in the left flank. Musculoskeletal: Chronic L1 compression fracture with vertebral augmentation. Hemangioma within L3 vertebral body. Multilevel degenerative change in the spine. Bony pelvis is intact. Degenerative change of the pubic symphysis. 1 IMPRESSION: 1. Minimally displaced left anterior sixth through ninth rib fractures. Small left pleural effusion and adjacent compressive atelectasis. No pneumothorax. 2. Subcutaneous contusion of the left lateral chest wall. 3. No additional acute traumatic injury to the chest, abdomen, or pelvis. 4. Moderate to large hiatal hernia with fluid-filled esophagus. Colonic diverticulosis. 5. Absent renal excretion on delayed phase imaging suggests renal dysfunction. Indeterminate lesion in the left mid kidney measuring 16 mm is unchanged from CT 1 year ago. Aortic Atherosclerosis (ICD10-I70.0). Electronically Signed   By: Keith Rake M.D.   On: 12/31/2018 00:37    EKG: Independently reviewed.  A. fib rate  controlled.  Assessment/Plan Principal Problem:   Symptomatic anemia Active Problems:   Breast cancer ([facility])   HTN (hypertension)   OSA on CPAP   Hypothyroidism   DM2 (diabetes mellitus, type 2) ([facility])   Hypoglycemia   Ribs, multiple fractures    1. Symptomatic anemia -patient has not noticed any bleeding per rectum or any obvious bleeding.  Stool for occult blood was negative.  Patient is receiving 2 units of packed red blood cell transfusion following which we had a repeat CBC.  Last colonoscopy 2015 showed diverticulosis.  Consult GI in the morning.  Presently does not be appear to be having acute GI bleed. 2. Hyporglycemia with history of diabetes mellitus on insulin with poor appetite lately with inducing hypoglycemia.  Will need to check hemoglobin A1c with hydroxybutyrate cortisol and TSH levels of once patient has had her blood transfusion however.  Holding oral hypoglycemic agents including insulin.  CBG checks every 2. 3. New onset A. Fib -I am not starting anticoagulation given that patient has significant anemia and also has had falls.  Patient is on verapamil for rate control.  Check 2D echo and check TSH when patient blood transfusion is done. 4. Left-sided rib fracture status post fall being followed by trauma surgery with incentive spirometer. 5. Hypothyroidism on Synthroid check TSH we will dose Synthroid IV for now. 6. Hypertension -we will continue home medications.  Including verapamil.  Patient is also on ARB you may need to closely monitor her creatinine. 7. Abnormal left kidney lesion seen in the CAT scan which will need follow-up. 8. Large hiatal hernia seen in the CAT scan.  Will need follow-up. 9. Sleep apnea on CPAP. 10. History of breast cancer in remission. 11. Severe protein calorie malnutrition will need nutrition consult.  Patient's repeat labs has to be ordered once blood transfusion is over.   DVT prophylaxis: SCDs for now given that patient has  significant anemia. Code Status: Full code confirmed with patient. Family Communication: Discussed with patient. Disposition Plan: May need rehab. Consults called: Trauma surgery. Admission status: Observation.   [name] MD [facility] Pager [phone].  If 7PM-7AM, please contact night-coverage [email or web address] Password [email or web address]  12/31/2018, 3:17 AM

## 2018-12-31 NOTE — Evaluation (Signed)
Physical Therapy Evaluation Patient Details Name: Diana [name] MRN: [id number] DOB: 08-25-1933 Today's Date: 12/31/2018   History of Present Illness  85 y.o. female admitted on 12/30/18 for fall with symptomatic anemia s/p 2 units PRBCs, hypoglycemia, A-fib, and rib fractures L anterior 6-9.  Pt with other significant PMH of DM2, renal insufficiency, obesity, HTN, breast CA, anemia (due to poor B12 absorption), back surgery, bowel resection, bil mastectomy.    Clinical Impression  Pt is weak, HR 100-137 A-fib, BPs 150s/90s-100 both supine and seated.  Pt needed two person assist to stand and was unable to side step up to [facility].  She vomited at end of session (RN made aware). Per pt and her caregiver, [name], this is weaker than usual and she has had two falls in the past two weeks.  Pt may need SNF for rehab, but [name] is hopeful to avoid SNF due to "COVID".  I asked for OT to come see her.  She does not have 24/7 assist at discharge which she would need right now to go home safely.   PT to follow acutely for deficits listed below.      Follow Up Recommendations SNF    Equipment Recommendations  None recommended by PT    Recommendations for Other Services OT consult     Precautions / Restrictions Precautions Precautions: Fall Precaution Comments: 2 falls in the past week trying to get to her WC      Mobility  Bed Mobility Overal bed mobility: Needs Assistance Bed Mobility: Supine to Sit;Sit to Supine     Supine to sit: Min assist Sit to supine: Min assist   General bed mobility comments: Min assist to provide a hand to come up to sitting and assist at her feet to return to supine.    Transfers Overall transfer level: Needs assistance Equipment used: 2 person hand held assist Transfers: Sit to/from Stand Sit to Stand: Min assist         General transfer comment: Min assist to stand from high stretcher. Therapist and her housekeeper assisting on each side.     Ambulation/Gait Ambulation/Gait assistance: Mod assist;+2 physical assistance Gait Distance (Feet): 2 Feet Assistive device: 2 person hand held assist Gait Pattern/deviations: Step-to pattern     General Gait Details: Attempted to side step up HOB, but pt very shakey and at risk for buckling over her legs.        Balance Overall balance assessment: Needs assistance Sitting-balance support: Feet unsupported;Bilateral upper extremity supported Sitting balance-Leahy Scale: Fair Sitting balance - Comments: close supervision EOB, bil UE prop   Standing balance support: Bilateral upper extremity supported Standing balance-Leahy Scale: Poor Standing balance comment: needs external support in standing.                              Pertinent Vitals/Pain Pain Assessment: Faces Faces Pain Scale: Hurts little more Pain Location: L ribs Pain Descriptors / Indicators: Grimacing;Guarding Pain Intervention(s): Limited activity within patient's tolerance;Monitored during session;Repositioned    Home Living Family/patient expects to be discharged to:: Private residence Living Arrangements: Alone Available Help at Discharge: Personal care attendant;Available PRN/intermittently Type of Home: House Home Access: Ramped entrance     Home Layout: Two level;Able to live on main level with bedroom/bathroom Home Equipment: Gilford Rile - 2 wheels;Bedside commode;Shower seat;Wheelchair - manual      Prior Function Level of Independence: Needs assistance   Gait / Transfers Assistance Needed: Pt  does most of her mobility from bed to WC, WC to bathroom 2-3 steps to the toilet holding the counters and back to [place].  Per her "housekeeper" she was able to walk further in January 2020 when she had home therapy just after coming home from rehab.   ADL's / Homemaking Assistance Needed: Pt is generally able to toilet herself.  She needs assist bathing and dressing.         Hand Dominance         Extremity/Trunk Assessment   Upper Extremity Assessment Upper Extremity Assessment: Generalized weakness    Lower Extremity Assessment Lower Extremity Assessment: Generalized weakness    Cervical / Trunk Assessment Cervical / Trunk Assessment: Kyphotic  Communication   Communication: Other (comment)(slurred speech)  Cognition Arousal/Alertness: Awake/alert Behavior During Therapy: WFL for tasks assessed/performed Overall Cognitive Status: Within Functional Limits for tasks assessed                                        General Comments General comments (skin integrity, edema, etc.): BPs 150s/90s supine, and 150s/100s seated.  Pt burping throughout and then vomited at end of session (? dizziness?) HR up to 137 in A-fib from 100s at rest, O2 sats stable on RA.        Assessment/Plan    PT Assessment Patient needs continued PT services  PT Problem List Decreased strength;Decreased activity tolerance;Decreased balance;Decreased mobility;Decreased knowledge of use of DME;Cardiopulmonary status limiting activity       PT Treatment Interventions DME instruction;Gait training;Functional mobility training;Therapeutic activities;Therapeutic exercise;Balance training;Patient/family education;Wheelchair mobility training    PT Goals (Current goals can be found in the Care Plan section)  Acute Rehab PT Goals Patient Stated Goal: to feel better, get back home PT Goal Formulation: With patient Time For Goal Achievement: 01/14/19 Potential to Achieve Goals: Good    Frequency Min 3X/week           AM-PAC PT "6 Clicks" Mobility  Outcome Measure Help needed turning from your back to your side while in a flat bed without using bedrails?: A Little Help needed moving from lying on your back to sitting on the side of a flat bed without using bedrails?: A Little Help needed moving to and from a bed to a chair (including a wheelchair)?: A Lot Help needed standing up  from a chair using your arms (e.g., wheelchair or bedside chair)?: A Lot Help needed to walk in hospital room?: A Lot Help needed climbing 3-5 steps with a railing? : Total 6 Click Score: 13    End of Session   Activity Tolerance: Patient limited by fatigue Patient left: in bed;with call bell/phone within reach   PT Visit Diagnosis: Muscle weakness (generalized) (M62.81);Repeated falls (R29.6)    Time: [id number] PT Time Calculation (min) (ACUTE ONLY): 40 min   Charges:       Wells Guiles B. Brennen Camper, PT, DPT  Acute Rehabilitation [phone] pager #[phone] office  @ [facility]: [phone]   PT Evaluation $PT Eval Moderate Complexity: 1 Mod PT Treatments $Therapeutic Activity: 23-37 mins       12/31/2018, 5:24 PM

## 2018-12-31 NOTE — Progress Notes (Signed)
PROGRESS NOTE    [name]  [id number] DOB: [birth year] DOA: 12/30/2018 PCP: [name], PA-C   Brief Narrative:  Diana [name] is Diana [name] 85 y.o. female with history of diabetes mellitus type 2 on insulin, hypertension, hypothyroidism, history of breast cancer in remission, sleep apnea had Dominika Losey fall at home and was found to be very weak and patient states she called her neighbor and eventually EMS was called.  On arrival patient was found to be weak and mildly confused and when EMS checked her blood sugar it was around 44 patient was given D50 following which blood sugar improved more than 100 and patient was brought to the ER.  Patient states she has been having frequent falls recently most of the time because she feels weak.  Not sure if she lost consciousness.  Denies hitting her head.  During one of the falls 2 days ago she had Diana [name] left side of the chest and since been hurting her.  Denies any difficulty breathing of any nausea vomiting or diarrhea but has been having some epigastric discomfort with Diana [name] poor appetite last few weeks.  ED Course: In the ER patient was afebrile.  Labs show creatinine 1.1 glucose 130 albumin 2.7 total protein 5.6 hemoglobin was 5.2 WBC 6.7 platelets 402.  COVID-19 test was negative stool for occult blood was negative.  Patient had CT head CT C-spine CT chest CT abdomen and pelvis which showed left-sided rib fracture with some continuation of the left chest wall.  Trauma service was consulted.  Given the multiple medical issues including severe anemia hypoglycemic episodes patient is being admitted under medical service.  EKG shows Ayelet Gruenewald. fib rate controlled.  This happened to be new.  Assessment & Plan:   Principal Problem:   Symptomatic anemia Active Problems:   Breast cancer (HCC)   HTN (hypertension)   OSA on CPAP   Hypothyroidism   DM2 (diabetes mellitus, type 2) (HCC)   Hypoglycemia   Ribs, multiple fractures   1. Symptomatic anemia -  seems to be most likely cause of her recent falls and weakness.  No obvous GI bleeding.  Negative FOBT. 1. Transfuse 2 units pRBC 2. Follow post transfusion H/H 3. Add on iron panel, b12, folate, ferritin, retics to previous blood draw if possible 4. GI c/s, appreciate recs   2. Hypoglycemia - most likely 2/2 insulin with her amaryl.  Will hold both and follow BG q6.  1. Will need adjustment in dose at d/c vs holding  2. Follow A1c and TSH.  Hold off on checking cortisol for now.  3. New onset Diana [name]. Fib - hold off on anticoagulation for now given severe anemia 1. Follow echo, TSH 2. Continue verapamil, uptitrate as needed 3. Appreciate cardiology c/s  4. Left-sided rib fracture status post fall being followed by trauma surgery.  Pain control, IS, pulm toilet, PT.   5. Hypothyroidism on Synthroid check TSH we will dose Synthroid IV for now.  6. Hypertension -verapamil and losartan   7. Abnormal left kidney lesion seen in the CAT scan which will need follow-up.  8. Large hiatal hernia seen in the CAT scan.  Will need follow-up.  9. Sleep apnea on CPAP.  10. History of breast cancer in remission.  11. Severe protein calorie malnutrition will need nutrition consult.   DVT prophylaxis: SCD Code Status: full  Family Communication: none at bedside Disposition Plan: pending further improvement   Consultants:   GI  Cardiology  Trauma surgery  Procedures:  Echo IMPRESSIONS    1. Left ventricular ejection fraction, by visual estimation, is 60 to 65%. The left ventricle has normal function. Normal left ventricular size. There is no left ventricular hypertrophy.  2. Left ventricular diastolic function could not be evaluated pattern of LV diastolic filling.  3. Global right ventricle has normal systolic function.The right ventricular size is not well visualized. No increase in right ventricular wall thickness.  4. Left atrial size was normal.  5. Right atrial size was not well  visualized.  6. Moderate mitral annular calcification.  7. The mitral valve was not well visualized. Trace mitral valve regurgitation. No evidence of mitral stenosis.  8. The tricuspid valve is normal in structure. Tricuspid valve regurgitation was not visualized by color flow Doppler.  9. The aortic valve is normal in structure. Aortic valve regurgitation was not visualized by color flow Doppler. Structurally normal aortic valve, with no evidence of sclerosis or stenosis. 10. The pulmonic valve was not well visualized. Pulmonic valve regurgitation is not visualized by color flow Doppler. 11. TR signal is inadequate for assessing pulmonary artery systolic pressure.  Antimicrobials:  Anti-infectives (From admission, onward)   None      Subjective: Feels generally poorly Was having low BG's, falling at home  Objective: Vitals:   12/31/18 1645 12/31/18 1700 12/31/18 1715 12/31/18 1730  BP: (!) 170/90 (!) 151/114 (!) 133/91 (!) 155/93  Pulse: (!) 48 (!) 119 (!) 109 (!) 113  Resp: (!) 21 (!) 25 (!) 24 18  Temp:      TempSrc:      SpO2: 95% 98% 98% 96%   No intake or output data in the 24 hours ending 12/31/18 1759 There were no vitals filed for this visit.  Examination:  General exam: Appears calm and comfortable  Respiratory system: Clear to auscultation. Respiratory effort normal. Cardiovascular system: S1 & S2 heard, RRR. Gastrointestinal system: Abdomen is nondistended, soft and nontender. Central nervous system: Alert and oriented. No focal neurological deficits. Extremities: trace LE edema Skin: No rashes, lesions or ulcers Psychiatry: Judgement and insight appear normal. Mood & affect appropriate.     Data Reviewed: I have personally reviewed following labs and imaging studies  CBC: Recent Labs  Lab 12/30/18 2246  WBC 6.7  NEUTROABS 5.4  HGB 5.2*  HCT 20.0*  MCV 62.9*  PLT [id number]*   Basic Metabolic Panel: Recent Labs  Lab 12/30/18 2246  NA 131*  K 3.7  CL  99  CO2 21*  GLUCOSE 130*  BUN 16  CREATININE 1.17*  CALCIUM 8.0*   GFR: CrCl cannot be calculated (Unknown ideal weight.). Liver Function Tests: Recent Labs  Lab 12/30/18 2246  AST 25  ALT 19  ALKPHOS 70  BILITOT 1.4*  PROT 5.6*  ALBUMIN 2.7*   No results for input(s): LIPASE, AMYLASE in the last 168 hours. No results for input(s): AMMONIA in the last 168 hours. Coagulation Profile: No results for input(s): INR, PROTIME in the last 168 hours. Cardiac Enzymes: No results for input(s): CKTOTAL, CKMB, CKMBINDEX, TROPONINI in the last 168 hours. BNP (last 3 results) No results for input(s): PROBNP in the last 8760 hours. HbA1C: No results for input(s): HGBA1C in the last 72 hours. CBG: Recent Labs  Lab 12/30/18 2108 12/30/18 2322 12/31/18 0241 12/31/18 1032 12/31/18 1535  GLUCAP 132* 128* 152* 81 191*   Lipid Profile: No results for input(s): CHOL, HDL, LDLCALC, TRIG, CHOLHDL, LDLDIRECT in the last 72 hours. Thyroid Function Tests: No results for input(s): TSH,  T4TOTAL, FREET4, T3FREE, THYROIDAB in the last 72 hours. Anemia Panel: No results for input(s): VITAMINB12, FOLATE, FERRITIN, TIBC, IRON, RETICCTPCT in the last 72 hours. Sepsis Labs: No results for input(s): PROCALCITON, LATICACIDVEN in the last 168 hours.  Recent Results (from the past 240 hour(s))  SARS CORONAVIRUS 2 (TAT 6-24 HRS) Nasopharyngeal Nasopharyngeal Swab     Status: None   Collection Time: 12/30/18 11:32 PM   Specimen: Nasopharyngeal Swab  Result Value Ref Range Status   SARS Coronavirus 2 NEGATIVE NEGATIVE Final    Comment: (NOTE) SARS-CoV-2 target nucleic acids are NOT DETECTED. The SARS-CoV-2 RNA is generally detectable in upper and lower respiratory specimens during the acute phase of infection. Negative results do not preclude SARS-CoV-2 infection, do not rule out co-infections with other pathogens, and should not be used as the sole basis for treatment or other patient management  decisions. Negative results must be combined with clinical observations, patient history, and epidemiological information. The expected result is Negative. Fact Sheet for Patients: SugarRoll.be Fact Sheet for Healthcare Providers: https://www.woods-mathews.com/ This test is not yet approved or cleared by the [place] FDA and  has been authorized for detection and/or diagnosis of SARS-CoV-2 by FDA under an Emergency Use Authorization (EUA). This EUA will remain  in effect (meaning this test can be used) for the duration of the COVID-19 declaration under Section 56 4(b)(1) of the Act, 21 U.S.C. section 360bbb-3(b)(1), unless the authorization is terminated or revoked sooner. Performed at [facility], [place] [address] [place], [place] [address]          Radiology Studies: Dg Ribs Unilateral W/chest Left  Result Date: 12/30/2018 CLINICAL DATA:  Left-sided rib pain fall EXAM: LEFT RIBS AND CHEST - 3+ VIEW COMPARISON:  07/12/2010 FINDINGS: Single-view chest demonstrates no acute airspace disease. There may be small left pleural effusion. Mild cardiomegaly with aortic atherosclerosis. No pneumothorax. Clips in the right axillary region. Left rib series demonstrates old appearing left fifth and 6 rib fractures. Acute mildly displaced left seventh, eighth, ninth and tenth anterolateral rib fractures. IMPRESSION: 1. Acute displaced left seventh through tenth rib fractures with adjacent small left effusion. 2. Negative for pneumothorax 3. Cardiomegaly Electronically Signed   By: Donavan Foil M.D.   On: 12/30/2018 22:23   Ct Head Wo Contrast  Result Date: 12/31/2018 CLINICAL DATA:  Head trauma, ataxia EXAM: CT HEAD, cervical spine WITHOUT CONTRAST TECHNIQUE: Contiguous axial images were obtained from the base of the skull through the vertex, cervical spine without intravenous contrast. COMPARISON:  December 06, 2017 FINDINGS: Brain: No  evidence of acute territorial infarction, hemorrhage, hydrocephalus,extra-axial collection or mass lesion/mass effect. There is dilatation the ventricles and sulci consistent with age-related atrophy. Low-attenuation changes in the deep white matter consistent with small vessel ischemia. Vascular: No hyperdense vessel or unexpected calcification. Skull: The skull is intact. No fracture or focal lesion identified. Sinuses/Orbits: The visualized paranasal sinuses are clear. There is fluid within the right mastoid air cell. The orbits and globes intact. Other: Unchanged small 1 cm in right parotid space nodule again identified. Cervical spine: Alignment: Physiologic Skull base and vertebrae: Visualized skull base is intact. No atlanto-occipital dissociation. The vertebral body heights are well maintained. No fracture or pathologic osseous lesion seen. Soft tissues and spinal canal: The visualized paraspinal soft tissues are unremarkable. No prevertebral soft tissue swelling is seen. The spinal canal is grossly unremarkable, no large epidural collection or significant canal narrowing. Disc levels: Mild disc height loss with disc osteophyte and  uncovertebral osteophytes is most notable at C6-C7. Upper chest: Again noted is Jaylie Neaves calcified nodule within the right thyroid lobe. There is chronic left-sided pleural thickening with Pearly Apachito posterior fourth healed rib fracture deformity. Other: None IMPRESSION: No acute intracranial abnormality. Findings consistent with age related atrophy and chronic small vessel ischemia Fluid within the right mastoid air cell as on prior exam. No acute fracture or malalignment of the spine. Electronically Signed   By: Prudencio Pair M.D.   On: 12/31/2018 00:42   Ct Chest W Contrast  Result Date: 12/31/2018 CLINICAL DATA:  Fall with left-sided pain. Rib fractures on radiograph. EXAM: CT CHEST, ABDOMEN, AND PELVIS WITH CONTRAST TECHNIQUE: Multidetector CT imaging of the chest, abdomen and pelvis was  performed following the standard protocol during bolus administration of intravenous contrast. CONTRAST:  98mL OMNIPAQUE IOHEXOL 300 MG/ML  SOLN COMPARISON:  Rib radiographs yesterday. Chest abdomen pelvis CT 12/06/2017 FINDINGS: CT CHEST FINDINGS Cardiovascular: No acute vascular injury. Aortic atherosclerosis and tortuosity. Normal heart size with coronary artery calcifications. Small pericardial effusion. Mediastinum/Nodes: No mediastinal hemorrhage or hematoma. No pneumomediastinum. Moderate hiatal hernia with fluid-filled esophagus. Peripherally calcified right thyroid nodule. No adenopathy. Lungs/Pleura: Small left pleural effusion and adjacent compressive atelectasis. No pneumothorax. Minor dependent atelectasis in the right lower lobe with trace right pleural effusion. Lungs are otherwise clear. Trachea and mainstem bronchi are patent. Musculoskeletal: Motion artifact partially obscures the known left anterior sixth through ninth rib fractures (reported as 7 through tenth rib fractures on radiograph). No radiographically occult rib fracture. No fracture of the sternum or included clavicles and shoulder girdles. No acute fracture of the lumbar spine. Vertebral body hemangioma within T11. Subcutaneous contusion involving the left lateral chest wall. No subcutaneous emphysema. CT ABDOMEN PELVIS FINDINGS Hepatobiliary: No hepatic injury or perihepatic hematoma. Gallbladder is surgically absent. Pancreas: No ductal dilatation or inflammation. Parenchymal atrophy without evidence of injury. Spleen: No splenic injury or perisplenic hematoma. Adrenals/Urinary Tract: No adrenal hemorrhage or renal injury identified. Multiple bilateral renal cysts. Absent renal excretion on delayed phase imaging suggest renal dysfunction. Possible solid lesion in the mid left kidney measuring 16 mm, series 3, image 62, not significantly changed from prior. Bladder is unremarkable. Stomach/Bowel: Moderate to large hiatal hernia. No  evidence of bowel injury. No mesenteric hematoma. No bowel wall thickening. Distal colonic diverticulosis without diverticulitis. Appendix not confidently visualized. Vascular/Lymphatic: No vascular injury. Aorto bi-iliac atherosclerosis and tortuosity. No retroperitoneal fluid. The IVC is intact. Portal vein is patent. No suspicious adenopathy. Reproductive: Status post hysterectomy. No adnexal masses. Other: No free air or free fluid. Mild patchy contusion in the left flank. Musculoskeletal: Chronic L1 compression fracture with vertebral augmentation. Hemangioma within L3 vertebral body. Multilevel degenerative change in the spine. Bony pelvis is intact. Degenerative change of the pubic symphysis. 1 IMPRESSION: 1. Minimally displaced left anterior sixth through ninth rib fractures. Small left pleural effusion and adjacent compressive atelectasis. No pneumothorax. 2. Subcutaneous contusion of the left lateral chest wall. 3. No additional acute traumatic injury to the chest, abdomen, or pelvis. 4. Moderate to large hiatal hernia with fluid-filled esophagus. Colonic diverticulosis. 5. Absent renal excretion on delayed phase imaging suggests renal dysfunction. Indeterminate lesion in the left mid kidney measuring 16 mm is unchanged from CT 1 year ago. Aortic Atherosclerosis (ICD10-I70.0). Electronically Signed   By: Keith Rake M.D.   On: 12/31/2018 00:37   Ct Cervical Spine Wo Contrast  Result Date: 12/31/2018 CLINICAL DATA:  Head trauma, ataxia EXAM: CT HEAD, cervical spine WITHOUT CONTRAST  TECHNIQUE: Contiguous axial images were obtained from the base of the skull through the vertex, cervical spine without intravenous contrast. COMPARISON:  December 06, 2017 FINDINGS: Brain: No evidence of acute territorial infarction, hemorrhage, hydrocephalus,extra-axial collection or mass lesion/mass effect. There is dilatation the ventricles and sulci consistent with age-related atrophy. Low-attenuation changes in  the deep white matter consistent with small vessel ischemia. Vascular: No hyperdense vessel or unexpected calcification. Skull: The skull is intact. No fracture or focal lesion identified. Sinuses/Orbits: The visualized paranasal sinuses are clear. There is fluid within the right mastoid air cell. The orbits and globes intact. Other: Unchanged small 1 cm in right parotid space nodule again identified. Cervical spine: Alignment: Physiologic Skull base and vertebrae: Visualized skull base is intact. No atlanto-occipital dissociation. The vertebral body heights are well maintained. No fracture or pathologic osseous lesion seen. Soft tissues and spinal canal: The visualized paraspinal soft tissues are unremarkable. No prevertebral soft tissue swelling is seen. The spinal canal is grossly unremarkable, no large epidural collection or significant canal narrowing. Disc levels: Mild disc height loss with disc osteophyte and uncovertebral osteophytes is most notable at C6-C7. Upper chest: Again noted is Brack Shaddock calcified nodule within the right thyroid lobe. There is chronic left-sided pleural thickening with Chanese Hartsough posterior fourth healed rib fracture deformity. Other: None IMPRESSION: No acute intracranial abnormality. Findings consistent with age related atrophy and chronic small vessel ischemia Fluid within the right mastoid air cell as on prior exam. No acute fracture or malalignment of the spine. Electronically Signed   By: Prudencio Pair M.D.   On: 12/31/2018 00:42   Ct Abdomen Pelvis W Contrast  Result Date: 12/31/2018 CLINICAL DATA:  Fall with left-sided pain. Rib fractures on radiograph. EXAM: CT CHEST, ABDOMEN, AND PELVIS WITH CONTRAST TECHNIQUE: Multidetector CT imaging of the chest, abdomen and pelvis was performed following the standard protocol during bolus administration of intravenous contrast. CONTRAST:  49mL OMNIPAQUE IOHEXOL 300 MG/ML  SOLN COMPARISON:  Rib radiographs yesterday. Chest abdomen pelvis CT  12/06/2017 FINDINGS: CT CHEST FINDINGS Cardiovascular: No acute vascular injury. Aortic atherosclerosis and tortuosity. Normal heart size with coronary artery calcifications. Small pericardial effusion. Mediastinum/Nodes: No mediastinal hemorrhage or hematoma. No pneumomediastinum. Moderate hiatal hernia with fluid-filled esophagus. Peripherally calcified right thyroid nodule. No adenopathy. Lungs/Pleura: Small left pleural effusion and adjacent compressive atelectasis. No pneumothorax. Minor dependent atelectasis in the right lower lobe with trace right pleural effusion. Lungs are otherwise clear. Trachea and mainstem bronchi are patent. Musculoskeletal: Motion artifact partially obscures the known left anterior sixth through ninth rib fractures (reported as 7 through tenth rib fractures on radiograph). No radiographically occult rib fracture. No fracture of the sternum or included clavicles and shoulder girdles. No acute fracture of the lumbar spine. Vertebral body hemangioma within T11. Subcutaneous contusion involving the left lateral chest wall. No subcutaneous emphysema. CT ABDOMEN PELVIS FINDINGS Hepatobiliary: No hepatic injury or perihepatic hematoma. Gallbladder is surgically absent. Pancreas: No ductal dilatation or inflammation. Parenchymal atrophy without evidence of injury. Spleen: No splenic injury or perisplenic hematoma. Adrenals/Urinary Tract: No adrenal hemorrhage or renal injury identified. Multiple bilateral renal cysts. Absent renal excretion on delayed phase imaging suggest renal dysfunction. Possible solid lesion in the mid left kidney measuring 16 mm, series 3, image 62, not significantly changed from prior. Bladder is unremarkable. Stomach/Bowel: Moderate to large hiatal hernia. No evidence of bowel injury. No mesenteric hematoma. No bowel wall thickening. Distal colonic diverticulosis without diverticulitis. Appendix not confidently visualized. Vascular/Lymphatic: No vascular injury. Aorto  bi-iliac atherosclerosis  and tortuosity. No retroperitoneal fluid. The IVC is intact. Portal vein is patent. No suspicious adenopathy. Reproductive: Status post hysterectomy. No adnexal masses. Other: No free air or free fluid. Mild patchy contusion in the left flank. Musculoskeletal: Chronic L1 compression fracture with vertebral augmentation. Hemangioma within L3 vertebral body. Multilevel degenerative change in the spine. Bony pelvis is intact. Degenerative change of the pubic symphysis. 1 IMPRESSION: 1. Minimally displaced left anterior sixth through ninth rib fractures. Small left pleural effusion and adjacent compressive atelectasis. No pneumothorax. 2. Subcutaneous contusion of the left lateral chest wall. 3. No additional acute traumatic injury to the chest, abdomen, or pelvis. 4. Moderate to large hiatal hernia with fluid-filled esophagus. Colonic diverticulosis. 5. Absent renal excretion on delayed phase imaging suggests renal dysfunction. Indeterminate lesion in the left mid kidney measuring 16 mm is unchanged from CT 1 year ago. Aortic Atherosclerosis (ICD10-I70.0). Electronically Signed   By: Keith Rake M.D.   On: 12/31/2018 00:37        Scheduled Meds:  sodium chloride   Intravenous Once   brimonidine  1 drop Both Eyes BID   And   timolol  1 drop Both Eyes BID   FLUoxetine  20 mg Oral Daily   levothyroxine  50 mcg Oral Daily   losartan  100 mg Oral Daily   polyethylene glycol  17 g Oral Daily   rosuvastatin  20 mg Oral Daily   verapamil  240 mg Oral QHS   Continuous Infusions:   LOS: 0 days    Time spent: over 30 min    Fayrene Helper, MD [facility] Pager [email or web address]  If 7PM-7AM, please contact night-coverage [email or web address] Password [email or web address] 12/31/2018, 5:59 PM

## 2018-12-31 NOTE — Consult Note (Signed)
[facility] Consult: 1:20 PM 12/31/2018  LOS: 0 days    Referring Provider: Dr Florene Glen  Primary Care Physician:  [name], PA-C Primary Gastroenterologist:  Delfin Edis    Reason for Consultation:  Anemia.     HPI: Diana [name] is a 85 y.o. female.  Hx OSA.  Hypertension.  Hyperlipidemia.  Hypothyroidism.  Obesity.  CKD.  bil breast cancer, bil mastectomy 2001  Cholecystectomy 1974.  Bowel resection 1974, no details on this surgery. 07/2013 colonoscopy.  For surveillance of adenomatous polyps.  Multiple polyps in 2002 and 03/2012.  Moderate pandiverticulosis.  8 semipedunculated and sessile polyps, 3 to 15 mm in size, found/removed throughout the colon (tubular adenomas, TVA.  no high-grade dysplasia)  Progressive decline over the last few months.  Increasing difficulty taking care of herself.  At baseline she is wheelchair-bound and she has been losing her balance and falling down during attempts to transfer from wheelchair to bed.  When she falls she is so weak its been hard for her to even get back up.  Presented to ED yesterday when she had fallen and was too weak to get up.  No LOC X-rays confirm left-sided rib fractures.  New onset atrial fibrillation. Hb 5.2, MCV 62.   FOBT negative Mild elevation of creatinine to 1.1, normal BUN. Na 131 otherwise normal electrolytes.   T bili slightly elevated at 1.4 and albumin low at 2.7 but otherwise LFTs normal. Covid 19 negative. CTAP with contrast shows moderate to large hiatal hernia with fluid-filled esophagus.  Colonic diverticulosis.  Non-GI findings include rib fractures, small left pleural effusion, left chest wall contusion.  Stable indeterminant 81mm left kidney lesion.  Absent renal excretion suggests renal dysfunction  Has received 2  PRBCs, await follow-up CBC  Patient has never previously received red cell transfusion.  Remotely took iron supplements.  Appetite is fair.  No nausea, vomiting, abdominal pain, dysphagia.  Chronic constipation.  Takes Metamucil at least 2 times, sometimes 3 times a day but still is constipated and requires self disimpaction.  BMs are nonbloody and occur 3 to 4 days a week.  Drinks very little water.  Eats chicken livers regularly.  No excessive or unusual bleeding or bruising.  She is lost an undetermined amount of weight but at least 30 pounds in the last couple of years, more than 10 pounds in the last year.   Past Medical History:  Diagnosis Date   Anemia    Anemia of decreased vitamin B12 absorption 05/26/2005   Arthritis    Breast cancer ([place]) 2001   Cataract    History of tobacco abuse    Hx of colonic polyps    Hyperlipidemia    Hypertension    Hypothyroidism    Obesity    OSA on CPAP 09/2007   AHI 10.6/hr overall, 43.64/hr during REM   Osteopenia    Renal insufficiency    Type 2 diabetes mellitus ([place])     Past Surgical History:  Procedure Laterality Date   BACK SURGERY  05/30/2009   BOWEL RESECTION  1974   CATARACT EXTRACTION Bilateral    bilateral   CHOLECYSTECTOMY  1974   MASTECTOMY Bilateral 2001   bilateral sentinel lymph nodes bio   NM MYOCAR PERF WALL MOTION  06/2007   dipyridamole; perfusion defect in inferior myocardium consistent with diaphragmatic attenuation, remaining myocardium with no ischemia/infarct, EF 73%; normal, low risk scan    TOTAL ABDOMINAL HYSTERECTOMY  1975   TRANSTHORACIC ECHOCARDIOGRAM  02/2010   123456, stage 1 diastolic dysfunction; borderline RV enlargement; LA mild-mod dilated; mild mitral annular calcif & mild MR; mild TR with normal RSVP, AV moderately sclerotics    Prior to Admission medications   Medication Sig Start Date End Date Taking? Authorizing Provider  Apoaequorin (PREVAGEN PO) Take 1 capsule by mouth  daily.   Yes [provider]  aspirin 81 MG tablet Take 81 mg by mouth 2 (two) times daily.    Yes [provider]  COMBIGAN 0.2-0.5 % ophthalmic solution Place 1 drop into both eyes every 12 (twelve) hours.  01/13/12  Yes [provider]  FLUoxetine (PROZAC) 20 MG capsule Take 20 mg by mouth daily.    Yes [provider]  glimepiride (AMARYL) 2 MG tablet Take 2 mg by mouth daily.    Yes [provider]  insulin glargine (LANTUS) 100 UNIT/ML injection Inject 20 Units into the skin at bedtime. Sliding scale   Yes [provider]  levothyroxine (SYNTHROID, LEVOTHROID) 50 MCG tablet Take 50 mcg by mouth daily.     Yes [provider]  losartan (COZAAR) 100 MG tablet Take 1 tablet (100 mg total) by mouth daily. NEEDS APPOINTMENT FOR FUTURE REFILLS Patient taking differently: Take 100 mg by mouth daily.  12/22/18  Yes Troy Sine, MD  Multiple Vitamins-Minerals (PRESERVISION AREDS 2) CAPS Take 2 capsules by mouth daily.   Yes [provider]  rosuvastatin (CRESTOR) 20 MG tablet Take 20 mg by mouth daily.   Yes [provider]  traMADol (ULTRAM) 50 MG tablet Take 1 tablet (50 mg total) by mouth every 6 (six) hours as needed for moderate pain. Patient taking differently: Take 50 mg by mouth 3 (three) times daily as needed for moderate pain.  12/08/17  Yes Drenda Freeze, MD  verapamil (CALAN-SR) 240 MG CR tablet Take 1 tablet (240 mg total) by mouth at bedtime. Please keep upcoming appt for further refills. 11/05/17  Yes Troy Sine, MD    Scheduled Meds:  sodium chloride   Intravenous Once   brimonidine  1 drop Both Eyes BID   And   timolol  1 drop Both Eyes BID   FLUoxetine  20 mg Oral Daily   levothyroxine  50 mcg Oral Daily   losartan  100 mg Oral Daily   rosuvastatin  20 mg Oral Daily   verapamil  240 mg Oral QHS   Infusions:  PRN Meds: acetaminophen **OR** acetaminophen, ondansetron **OR**  ondansetron (ZOFRAN) IV   Allergies as of 12/30/2018 - Review Complete 12/30/2018  Allergen Reaction Noted   Codeine sulfate Nausea Only 01/02/2011    Family History  Problem Relation Age of Onset   Heart attack Mother    Heart attack Father    Colon cancer Neg Hx     Social History   Socioeconomic History   Marital status: Married    Spouse name: Not on file   Number of children: Not on file   Years of education: Not on file   Highest education level: Not on file  Occupational History   Not on file  Social Needs   Financial resource strain: Not on file   Food insecurity    Worry: Not on file    Inability: Not on file   Transportation needs    Medical: Not on file    Non-medical: Not on file  Tobacco Use   Smoking status: Former Smoker    Years: 13.00    Types: Cigarettes    Quit date: 05/02/2002    Years since quitting: 16.6   Smokeless tobacco: Never Used  Substance and Sexual Activity   Alcohol use: No    Alcohol/week: 0.0 standard drinks   Drug use: Never   Sexual activity: Not on file  Lifestyle   Physical activity    Days per week: Not on file    Minutes per session: Not on file   Stress: Not on file  Relationships   Social connections    Talks on phone: Not on file    Gets together: Not on file    Attends religious service: Not on file    Active member of club or organization: Not on file    Attends meetings of clubs or organizations: Not on file    Relationship status: Not on file   Intimate partner violence    Fear of current or ex partner: Not on file    Emotionally abused: Not on file    Physically abused: Not on file    Forced sexual activity: Not on file  Other Topics Concern   Not on file  Social History Narrative   Not on file    REVIEW OF SYSTEMS: Constitutional: Weakness. ENT:  No nose bleeds Pulm: No current shortness of breath. CV:  No palpitations, no LE edema.  No cough, no dyspnea. GU: Incontinent of  urine.  No frequency or hematuria. GI: See HPI Heme: See HPI Transfusions: See HPI Neuro:  No headaches, no peripheral tingling or numbness.  Syncope.  No seizures. Derm:  No itching, no rash or sores.  Endocrine:  No sweats or chills.  No polyuria or dysuria Immunization: No records of previous or current immunizations. Travel:  None beyond local counties in last few months.    PHYSICAL EXAM: Vital signs in last 24 hours: Vitals:   12/31/18 1015 12/31/18 1231  BP: (!) 145/87 (!) 151/91  Pulse:  (!) 103  Resp: 20 (!) 23  Temp:    SpO2:  95%   Wt Readings from Last 3 Encounters:  12/06/17 102.1 kg  12/04/16 106.6 kg  09/04/16 107.5 kg    General: Obese, chronically ill looking, pale WF.  Comfortable, alert. Head: No facial asymmetry or swelling.  No signs of head trauma. Eyes: No scleral icterus.  No conjunctival pallor.  EOMI. Ears: Not hard of hearing Nose: No congestion or discharge Mouth: Oral mucosa moist, pink, clear.  Fair dentition.  Tongue midline.  Smile symmetric. Neck: No JVD, no masses, no thyromegaly Lungs: Clear bilaterally.  No labored breathing or cough. Bilateral mastectomies, no reconstructive surgery.  ICD on left anterior shoulder region. Heart: Slightly irregular, rate in the 90s. Abdomen: Obese.  Soft.  Active bowel sounds.  No HSM, masses, or organomegaly, bruits..  GU: Smells strongly of urine Rectal: Deferred.  Stool tested FOBT negative in the lab late last night. Musc/Skeltl: No joint redness or swelling.  Do not see bruising in the left thorax. Extremities: Slight, nonpitting pedal edema. Neurologic: Alert.  Oriented x3.  Speech a little bit slurred but  accurate.  Moves all 4 limbs, no tremor.  Symmetric weakness on flexion at elbow. Skin: No rash, no sores, no bruising. Nodes: No cervical adenopathy Psych: cooperative, pleasant  Intake/Output from previous day: No intake/output data recorded. Intake/Output this shift: No intake/output  data recorded.  LAB RESULTS: Recent Labs    12/30/18 2246  WBC 6.7  HGB 5.2*  HCT 20.0*  PLT 402*   BMET Lab Results  Component Value Date   NA 131 (L) 12/30/2018   NA 141 12/06/2017   NA 139 03/18/2011   K 3.7 12/30/2018   K 3.9 12/06/2017   K 3.7 03/18/2011   CL 99 12/30/2018   CL 109 12/06/2017   CL 102 03/18/2011   CO2 21 (L) 12/30/2018   CO2 23 12/06/2017   CO2 26 03/18/2011   GLUCOSE 130 (H) 12/30/2018   GLUCOSE 50 (L) 12/06/2017   GLUCOSE 281 (H) 03/18/2011   BUN 16 12/30/2018   BUN 36 (H) 12/06/2017   BUN 27 (H) 03/18/2011   CREATININE 1.17 (H) 12/30/2018   CREATININE 1.33 (H) 12/06/2017   CREATININE 1.70 (H) 03/18/2011   CALCIUM 8.0 (L) 12/30/2018   CALCIUM 8.3 (L) 12/06/2017   CALCIUM 8.4 03/18/2011   LFT Recent Labs    12/30/18 2246  PROT 5.6*  ALBUMIN 2.7*  AST 25  ALT 19  ALKPHOS 70  BILITOT 1.4*   PT/INR Lab Results  Component Value Date   INR 1.09 12/06/2017   INR 1.11 05/28/2009   Hepatitis Panel No results for input(s): HEPBSAG, HCVAB, HEPAIGM, HEPBIGM in the last 72 hours. C-Diff No components found for: CDIFF Lipase     Component Value Date/Time   LIPASE 44 12/06/2017 1436    Drugs of Abuse  No results found for: LABOPIA, COCAINSCRNUR, LABBENZ, AMPHETMU, THCU, LABBARB   RADIOLOGY STUDIES: Dg Ribs Unilateral W/chest Left  Result Date: 12/30/2018 CLINICAL DATA:  Left-sided rib pain fall EXAM: LEFT RIBS AND CHEST - 3+ VIEW COMPARISON:  07/12/2010 FINDINGS: Single-view chest demonstrates no acute airspace disease. There may be small left pleural effusion. Mild cardiomegaly with aortic atherosclerosis. No pneumothorax. Clips in the right axillary region. Left rib series demonstrates old appearing left fifth and 6 rib fractures. Acute mildly displaced left seventh, eighth, ninth and tenth anterolateral rib fractures. IMPRESSION: 1. Acute displaced left seventh through tenth rib fractures with adjacent small left effusion. 2.  Negative for pneumothorax 3. Cardiomegaly Electronically Signed   By: Donavan Foil M.D.   On: 12/30/2018 22:23   Ct Head Wo Contrast  Result Date: 12/31/2018 CLINICAL DATA:  Head trauma, ataxia EXAM: CT HEAD, cervical spine WITHOUT CONTRAST TECHNIQUE: Contiguous axial images were obtained from the base of the skull through the vertex, cervical spine without intravenous contrast. COMPARISON:  December 06, 2017 FINDINGS: Brain: No evidence of acute territorial infarction, hemorrhage, hydrocephalus,extra-axial collection or mass lesion/mass effect. There is dilatation the ventricles and sulci consistent with age-related atrophy. Low-attenuation changes in the deep white matter consistent with small vessel ischemia. Vascular: No hyperdense vessel or unexpected calcification. Skull: The skull is intact. No fracture or focal lesion identified. Sinuses/Orbits: The visualized paranasal sinuses are clear. There is fluid within the right mastoid air cell. The orbits and globes intact. Other: Unchanged small 1 cm in right parotid space nodule again identified. Cervical spine: Alignment: Physiologic Skull base and vertebrae: Visualized skull base is intact. No atlanto-occipital dissociation. The vertebral body heights are well maintained. No fracture or pathologic osseous lesion seen. Soft tissues and spinal  canal: The visualized paraspinal soft tissues are unremarkable. No prevertebral soft tissue swelling is seen. The spinal canal is grossly unremarkable, no large epidural collection or significant canal narrowing. Disc levels: Mild disc height loss with disc osteophyte and uncovertebral osteophytes is most notable at C6-C7. Upper chest: Again noted is a calcified nodule within the right thyroid lobe. There is chronic left-sided pleural thickening with a posterior fourth healed rib fracture deformity. Other: None IMPRESSION: No acute intracranial abnormality. Findings consistent with age related atrophy and chronic  small vessel ischemia Fluid within the right mastoid air cell as on prior exam. No acute fracture or malalignment of the spine. Electronically Signed   By: Prudencio Pair M.D.   On: 12/31/2018 00:42   Ct Chest W Contrast  Result Date: 12/31/2018 CLINICAL DATA:  Fall with left-sided pain. Rib fractures on radiograph. EXAM: CT CHEST, ABDOMEN, AND PELVIS WITH CONTRAST TECHNIQUE: Multidetector CT imaging of the chest, abdomen and pelvis was performed following the standard protocol during bolus administration of intravenous contrast. CONTRAST:  88mL OMNIPAQUE IOHEXOL 300 MG/ML  SOLN COMPARISON:  Rib radiographs yesterday. Chest abdomen pelvis CT 12/06/2017 FINDINGS: CT CHEST FINDINGS Cardiovascular: No acute vascular injury. Aortic atherosclerosis and tortuosity. Normal heart size with coronary artery calcifications. Small pericardial effusion. Mediastinum/Nodes: No mediastinal hemorrhage or hematoma. No pneumomediastinum. Moderate hiatal hernia with fluid-filled esophagus. Peripherally calcified right thyroid nodule. No adenopathy. Lungs/Pleura: Small left pleural effusion and adjacent compressive atelectasis. No pneumothorax. Minor dependent atelectasis in the right lower lobe with trace right pleural effusion. Lungs are otherwise clear. Trachea and mainstem bronchi are patent. Musculoskeletal: Motion artifact partially obscures the known left anterior sixth through ninth rib fractures (reported as 7 through tenth rib fractures on radiograph). No radiographically occult rib fracture. No fracture of the sternum or included clavicles and shoulder girdles. No acute fracture of the lumbar spine. Vertebral body hemangioma within T11. Subcutaneous contusion involving the left lateral chest wall. No subcutaneous emphysema. CT ABDOMEN PELVIS FINDINGS Hepatobiliary: No hepatic injury or perihepatic hematoma. Gallbladder is surgically absent. Pancreas: No ductal dilatation or inflammation. Parenchymal atrophy without  evidence of injury. Spleen: No splenic injury or perisplenic hematoma. Adrenals/Urinary Tract: No adrenal hemorrhage or renal injury identified. Multiple bilateral renal cysts. Absent renal excretion on delayed phase imaging suggest renal dysfunction. Possible solid lesion in the mid left kidney measuring 16 mm, series 3, image 62, not significantly changed from prior. Bladder is unremarkable. Stomach/Bowel: Moderate to large hiatal hernia. No evidence of bowel injury. No mesenteric hematoma. No bowel wall thickening. Distal colonic diverticulosis without diverticulitis. Appendix not confidently visualized. Vascular/Lymphatic: No vascular injury. Aorto bi-iliac atherosclerosis and tortuosity. No retroperitoneal fluid. The IVC is intact. Portal vein is patent. No suspicious adenopathy. Reproductive: Status post hysterectomy. No adnexal masses. Other: No free air or free fluid. Mild patchy contusion in the left flank. Musculoskeletal: Chronic L1 compression fracture with vertebral augmentation. Hemangioma within L3 vertebral body. Multilevel degenerative change in the spine. Bony pelvis is intact. Degenerative change of the pubic symphysis. 1 IMPRESSION: 1. Minimally displaced left anterior sixth through ninth rib fractures. Small left pleural effusion and adjacent compressive atelectasis. No pneumothorax. 2. Subcutaneous contusion of the left lateral chest wall. 3. No additional acute traumatic injury to the chest, abdomen, or pelvis. 4. Moderate to large hiatal hernia with fluid-filled esophagus. Colonic diverticulosis. 5. Absent renal excretion on delayed phase imaging suggests renal dysfunction. Indeterminate lesion in the left mid kidney measuring 16 mm is unchanged from CT 1 year ago. Aortic Atherosclerosis (  ICD10-I70.0). Electronically Signed   By: Keith Rake M.D.   On: 12/31/2018 00:37   Ct Cervical Spine Wo Contrast  Result Date: 12/31/2018 CLINICAL DATA:  Head trauma, ataxia EXAM: CT HEAD,  cervical spine WITHOUT CONTRAST TECHNIQUE: Contiguous axial images were obtained from the base of the skull through the vertex, cervical spine without intravenous contrast. COMPARISON:  December 06, 2017 FINDINGS: Brain: No evidence of acute territorial infarction, hemorrhage, hydrocephalus,extra-axial collection or mass lesion/mass effect. There is dilatation the ventricles and sulci consistent with age-related atrophy. Low-attenuation changes in the deep white matter consistent with small vessel ischemia. Vascular: No hyperdense vessel or unexpected calcification. Skull: The skull is intact. No fracture or focal lesion identified. Sinuses/Orbits: The visualized paranasal sinuses are clear. There is fluid within the right mastoid air cell. The orbits and globes intact. Other: Unchanged small 1 cm in right parotid space nodule again identified. Cervical spine: Alignment: Physiologic Skull base and vertebrae: Visualized skull base is intact. No atlanto-occipital dissociation. The vertebral body heights are well maintained. No fracture or pathologic osseous lesion seen. Soft tissues and spinal canal: The visualized paraspinal soft tissues are unremarkable. No prevertebral soft tissue swelling is seen. The spinal canal is grossly unremarkable, no large epidural collection or significant canal narrowing. Disc levels: Mild disc height loss with disc osteophyte and uncovertebral osteophytes is most notable at C6-C7. Upper chest: Again noted is a calcified nodule within the right thyroid lobe. There is chronic left-sided pleural thickening with a posterior fourth healed rib fracture deformity. Other: None IMPRESSION: No acute intracranial abnormality. Findings consistent with age related atrophy and chronic small vessel ischemia Fluid within the right mastoid air cell as on prior exam. No acute fracture or malalignment of the spine. Electronically Signed   By: Prudencio Pair M.D.   On: 12/31/2018 00:42   Ct Abdomen Pelvis  W Contrast  Result Date: 12/31/2018 CLINICAL DATA:  Fall with left-sided pain. Rib fractures on radiograph. EXAM: CT CHEST, ABDOMEN, AND PELVIS WITH CONTRAST TECHNIQUE: Multidetector CT imaging of the chest, abdomen and pelvis was performed following the standard protocol during bolus administration of intravenous contrast. CONTRAST:  37mL OMNIPAQUE IOHEXOL 300 MG/ML  SOLN COMPARISON:  Rib radiographs yesterday. Chest abdomen pelvis CT 12/06/2017 FINDINGS: CT CHEST FINDINGS Cardiovascular: No acute vascular injury. Aortic atherosclerosis and tortuosity. Normal heart size with coronary artery calcifications. Small pericardial effusion. Mediastinum/Nodes: No mediastinal hemorrhage or hematoma. No pneumomediastinum. Moderate hiatal hernia with fluid-filled esophagus. Peripherally calcified right thyroid nodule. No adenopathy. Lungs/Pleura: Small left pleural effusion and adjacent compressive atelectasis. No pneumothorax. Minor dependent atelectasis in the right lower lobe with trace right pleural effusion. Lungs are otherwise clear. Trachea and mainstem bronchi are patent. Musculoskeletal: Motion artifact partially obscures the known left anterior sixth through ninth rib fractures (reported as 7 through tenth rib fractures on radiograph). No radiographically occult rib fracture. No fracture of the sternum or included clavicles and shoulder girdles. No acute fracture of the lumbar spine. Vertebral body hemangioma within T11. Subcutaneous contusion involving the left lateral chest wall. No subcutaneous emphysema. CT ABDOMEN PELVIS FINDINGS Hepatobiliary: No hepatic injury or perihepatic hematoma. Gallbladder is surgically absent. Pancreas: No ductal dilatation or inflammation. Parenchymal atrophy without evidence of injury. Spleen: No splenic injury or perisplenic hematoma. Adrenals/Urinary Tract: No adrenal hemorrhage or renal injury identified. Multiple bilateral renal cysts. Absent renal excretion on delayed phase  imaging suggest renal dysfunction. Possible solid lesion in the mid left kidney measuring 16 mm, series 3, image 62, not significantly changed  from prior. Bladder is unremarkable. Stomach/Bowel: Moderate to large hiatal hernia. No evidence of bowel injury. No mesenteric hematoma. No bowel wall thickening. Distal colonic diverticulosis without diverticulitis. Appendix not confidently visualized. Vascular/Lymphatic: No vascular injury. Aorto bi-iliac atherosclerosis and tortuosity. No retroperitoneal fluid. The IVC is intact. Portal vein is patent. No suspicious adenopathy. Reproductive: Status post hysterectomy. No adnexal masses. Other: No free air or free fluid. Mild patchy contusion in the left flank. Musculoskeletal: Chronic L1 compression fracture with vertebral augmentation. Hemangioma within L3 vertebral body. Multilevel degenerative change in the spine. Bony pelvis is intact. Degenerative change of the pubic symphysis. 1 IMPRESSION: 1. Minimally displaced left anterior sixth through ninth rib fractures. Small left pleural effusion and adjacent compressive atelectasis. No pneumothorax. 2. Subcutaneous contusion of the left lateral chest wall. 3. No additional acute traumatic injury to the chest, abdomen, or pelvis. 4. Moderate to large hiatal hernia with fluid-filled esophagus. Colonic diverticulosis. 5. Absent renal excretion on delayed phase imaging suggests renal dysfunction. Indeterminate lesion in the left mid kidney measuring 16 mm is unchanged from CT 1 year ago. Aortic Atherosclerosis (ICD10-I70.0). Electronically Signed   By: Keith Rake M.D.   On: 12/31/2018 00:37     IMPRESSION:   *      Microcytic anemia.  *     History adenomatous and serrated adenomatous colon polyps.  Last colonoscopy in 2015 with recurrent adenomas.  No recall colonoscopy suggested due to patient age. Chronic constipation despite regular doses Metamucil.  *     New onset A. Fib.  Rate controlled with verapamil.   Decision not yet made regarding initiating long-term anticoagulation.  *     Physically debilitated.  Mostly spends her time in a wheelchair in bed.  She is able to take steps using cane or other devices for balance when necessary but prefers to avoid walking according to her home caretaker who is in the room today.   PLAN:     *   Patient is quite frail.  I do not think that colonoscopy would be advisable.  She could certainly tolerate EGD.  Dr. [name] will be rounding on her and making decisions as to further work-up.  *     Await pending TSH, iron, B12, folate testing  *      Add MiraLAX daily.     Azucena Freed  12/31/2018, 1:20 PM Phone [phone]

## 2018-12-31 NOTE — Consult Note (Signed)
Reason for Consult: Left-sided rib fractures Referring Physician: Lebron Conners  Diana [name] is an 85 y.o. female.  HPI: 85 year old female with multiple medical problems was referred to the emergency department for hypoglycemia.  During evaluation here it was noted that she had a fall a few days ago and was having left-sided chest wall pain.  Work-up revealed left-sided rib fractures and I was asked to see her in consultation.  Additionally, she was found to be anemic with hemoglobin of 5.2.  She reports that she fell a few days ago out of her wheelchair onto the floor.  Her neighbors helped her up but she has had some chest wall soreness since that time.  She does not think she lost consciousness when she fell.  She reports she has not been eating well at home.  No significant shortness of breath.  Denies upper respiratory infection.  Past Medical History:  Diagnosis Date  . Anemia   . Anemia of decreased vitamin B12 absorption 05/26/2005  . Arthritis   . Breast cancer ([place]) 2001  . Cataract   . History of tobacco abuse   . Hx of colonic polyps   . Hyperlipidemia   . Hypertension   . Hypothyroidism   . Obesity   . OSA on CPAP 09/2007   AHI 10.6/hr overall, 43.64/hr during REM  . Osteopenia   . Renal insufficiency   . Type 2 diabetes mellitus ([place])     Past Surgical History:  Procedure Laterality Date  . BACK SURGERY  05/30/2009  . BOWEL RESECTION  1974  . CATARACT EXTRACTION Bilateral    bilateral  . CHOLECYSTECTOMY  1974  . MASTECTOMY Bilateral 2001   bilateral sentinel lymph nodes bio  . NM MYOCAR PERF WALL MOTION  06/2007   dipyridamole; perfusion defect in inferior myocardium consistent with diaphragmatic attenuation, remaining myocardium with no ischemia/infarct, EF 73%; normal, low risk scan   . TOTAL ABDOMINAL HYSTERECTOMY  1975  . TRANSTHORACIC ECHOCARDIOGRAM  02/2010   123456, stage 1 diastolic dysfunction; borderline RV enlargement; LA mild-mod dilated; mild mitral  annular calcif & mild MR; mild TR with normal RSVP, AV moderately sclerotics    Family History  Problem Relation Age of Onset  . Heart attack Mother   . Heart attack Father   . Colon cancer Neg Hx     Social History:  reports that she quit smoking about 16 years ago. Her smoking use included cigarettes. She quit after 13.00 years of use. She has never used smokeless tobacco. She reports that she does not drink alcohol or use drugs.  Allergies:  Allergies  Allergen Reactions  . Codeine Sulfate Nausea Only    Medications: I have reviewed the patient's current medications.  Results for orders placed or performed during the hospital encounter of 12/30/18 (from the past 48 hour(s))  CBG monitoring, ED     Status: Abnormal   Collection Time: 12/30/18  9:08 PM  Result Value Ref Range   Glucose-Capillary 132 (H) 70 - 99 mg/dL  Comprehensive metabolic panel     Status: Abnormal   Collection Time: 12/30/18 10:46 PM  Result Value Ref Range   Sodium 131 (L) 135 - 145 mmol/L   Potassium 3.7 3.5 - 5.1 mmol/L   Chloride 99 98 - 111 mmol/L   CO2 21 (L) 22 - 32 mmol/L   Glucose, Bld 130 (H) 70 - 99 mg/dL   BUN 16 8 - 23 mg/dL   Creatinine, Ser 1.17 (H) 0.44 -  1.00 mg/dL   Calcium 8.0 (L) 8.9 - 10.3 mg/dL   Total Protein 5.6 (L) 6.5 - 8.1 g/dL   Albumin 2.7 (L) 3.5 - 5.0 g/dL   AST 25 15 - 41 U/L   ALT 19 0 - 44 U/L   Alkaline Phosphatase 70 38 - 126 U/L   Total Bilirubin 1.4 (H) 0.3 - 1.2 mg/dL   GFR calc non Af Amer 43 (L) >60 mL/min   GFR calc Af Amer 50 (L) >60 mL/min   Anion gap 11 5 - 15    Comment: Performed at [facility] [address], [place], [place] [address]  CBC with Differential     Status: Abnormal   Collection Time: 12/30/18 10:46 PM  Result Value Ref Range   WBC 6.7 4.0 - 10.5 K/uL   RBC 3.18 (L) 3.87 - 5.11 MIL/uL   Hemoglobin 5.2 (LL) 12.0 - 15.0 g/dL    Comment: REPEATED TO VERIFY Reticulocyte Hemoglobin testing may be clinically indicated, consider  ordering this additional test [id number] THIS CRITICAL RESULT HAS VERIFIED AND BEEN CALLED TO RN WILSON SHANIECE BY MESSAN HOUEGNIFIO ON 10 22 2020 AT 2316, AND HAS BEEN READ BACK.     HCT 20.0 (L) 36.0 - 46.0 %   MCV 62.9 (L) 80.0 - 100.0 fL   MCH 16.4 (L) 26.0 - 34.0 pg   MCHC 26.0 (L) 30.0 - 36.0 g/dL   RDW 21.3 (H) 11.5 - 15.5 %   Platelets 402 (H) 150 - 400 K/uL   nRBC 1.1 (H) 0.0 - 0.2 %   Neutrophils Relative % 80 %   Neutro Abs 5.4 1.7 - 7.7 K/uL   Lymphocytes Relative 7 %   Lymphs Abs 0.5 (L) 0.7 - 4.0 K/uL   Monocytes Relative 12 %   Monocytes Absolute 0.8 0.1 - 1.0 K/uL   Eosinophils Relative 0 %   Eosinophils Absolute 0.0 0.0 - 0.5 K/uL   Basophils Relative 0 %   Basophils Absolute 0.0 0.0 - 0.1 K/uL   Immature Granulocytes 1 %   Abs Immature Granulocytes 0.04 0.00 - 0.07 K/uL    Comment: Performed at [facility], [place] [address], [place], [place] [address]  CBG monitoring, ED     Status: Abnormal   Collection Time: 12/30/18 11:22 PM  Result Value Ref Range   Glucose-Capillary 128 (H) 70 - 99 mg/dL  Type and screen [place]     Status: None (Preliminary result)   Collection Time: 12/30/18 11:25 PM  Result Value Ref Range   ABO/RH(D) A POS    Antibody Screen NEG    Sample Expiration 01/02/2019,2359    Unit Number [id number]    Blood Component Type RED CELLS,LR    Unit division 00    Status of Unit ALLOCATED    Transfusion Status OK TO TRANSFUSE    Crossmatch Result Compatible    Unit Number [id number]    Blood Component Type RED CELLS,LR    Unit division 00    Status of Unit ISSUED    Transfusion Status OK TO TRANSFUSE    Crossmatch Result      Compatible Performed at [facility], [place] [address], [place], [place] [address]   Prepare RBC     Status: None   Collection Time: 12/30/18 11:25 PM  Result Value Ref Range   Order Confirmation      ORDER PROCESSED BY BLOOD BANK Performed at [facility], [address] [place], [place] [address]   ABO/Rh     Status: None   Collection Time: 12/30/18 11:25 PM  Result Value Ref Range   ABO/RH(D)      A POS Performed at [facility] [address], [place], [place] [address]   POC occult blood, ED     Status: None   Collection Time: 12/30/18 11:49 PM  Result Value Ref Range   Fecal Occult Bld NEGATIVE NEGATIVE    Dg Ribs Unilateral W/chest Left  Result Date: 12/30/2018 CLINICAL DATA:  Left-sided rib pain fall EXAM: LEFT RIBS AND CHEST - 3+ VIEW COMPARISON:  07/12/2010 FINDINGS: Single-view chest demonstrates no acute airspace disease. There may be small left pleural effusion. Mild cardiomegaly with aortic atherosclerosis. No pneumothorax. Clips in the right axillary region. Left rib series demonstrates old appearing left fifth and 6 rib fractures. Acute mildly displaced left seventh, eighth, ninth and tenth anterolateral rib fractures. IMPRESSION: 1. Acute displaced left seventh through tenth rib fractures with adjacent small left effusion. 2. Negative for pneumothorax 3. Cardiomegaly Electronically Signed   By: Donavan Foil M.D.   On: 12/30/2018 22:23   Ct Head Wo Contrast  Result Date: 12/31/2018 CLINICAL DATA:  Head trauma, ataxia EXAM: CT HEAD, cervical spine WITHOUT CONTRAST TECHNIQUE: Contiguous axial images were obtained from the base of the skull through the vertex, cervical spine without intravenous contrast. COMPARISON:  December 06, 2017 FINDINGS: Brain: No evidence of acute territorial infarction, hemorrhage, hydrocephalus,extra-axial collection or mass lesion/mass effect. There is dilatation the ventricles and sulci consistent with age-related atrophy. Low-attenuation changes in the deep white matter consistent with small vessel ischemia. Vascular: No hyperdense vessel or unexpected calcification. Skull: The skull is intact. No fracture or focal lesion identified. Sinuses/Orbits: The visualized paranasal sinuses are clear. There is fluid  within the right mastoid air cell. The orbits and globes intact. Other: Unchanged small 1 cm in right parotid space nodule again identified. Cervical spine: Alignment: Physiologic Skull base and vertebrae: Visualized skull base is intact. No atlanto-occipital dissociation. The vertebral body heights are well maintained. No fracture or pathologic osseous lesion seen. Soft tissues and spinal canal: The visualized paraspinal soft tissues are unremarkable. No prevertebral soft tissue swelling is seen. The spinal canal is grossly unremarkable, no large epidural collection or significant canal narrowing. Disc levels: Mild disc height loss with disc osteophyte and uncovertebral osteophytes is most notable at C6-C7. Upper chest: Again noted is a calcified nodule within the right thyroid lobe. There is chronic left-sided pleural thickening with a posterior fourth healed rib fracture deformity. Other: None IMPRESSION: No acute intracranial abnormality. Findings consistent with age related atrophy and chronic small vessel ischemia Fluid within the right mastoid air cell as on prior exam. No acute fracture or malalignment of the spine. Electronically Signed   By: Prudencio Pair M.D.   On: 12/31/2018 00:42   Ct Chest W Contrast  Result Date: 12/31/2018 CLINICAL DATA:  Fall with left-sided pain. Rib fractures on radiograph. EXAM: CT CHEST, ABDOMEN, AND PELVIS WITH CONTRAST TECHNIQUE: Multidetector CT imaging of the chest, abdomen and pelvis was performed following the standard protocol during bolus administration of intravenous contrast. CONTRAST:  46mL OMNIPAQUE IOHEXOL 300 MG/ML  SOLN COMPARISON:  Rib radiographs yesterday. Chest abdomen pelvis CT 12/06/2017 FINDINGS: CT CHEST FINDINGS Cardiovascular: No acute vascular injury. Aortic atherosclerosis and tortuosity. Normal heart size with coronary artery calcifications. Small pericardial effusion. Mediastinum/Nodes: No mediastinal hemorrhage or hematoma. No pneumomediastinum.  Moderate hiatal hernia  with fluid-filled esophagus. Peripherally calcified right thyroid nodule. No adenopathy. Lungs/Pleura: Small left pleural effusion and adjacent compressive atelectasis. No pneumothorax. Minor dependent atelectasis in the right lower lobe with trace right pleural effusion. Lungs are otherwise clear. Trachea and mainstem bronchi are patent. Musculoskeletal: Motion artifact partially obscures the known left anterior sixth through ninth rib fractures (reported as 7 through tenth rib fractures on radiograph). No radiographically occult rib fracture. No fracture of the sternum or included clavicles and shoulder girdles. No acute fracture of the lumbar spine. Vertebral body hemangioma within T11. Subcutaneous contusion involving the left lateral chest wall. No subcutaneous emphysema. CT ABDOMEN PELVIS FINDINGS Hepatobiliary: No hepatic injury or perihepatic hematoma. Gallbladder is surgically absent. Pancreas: No ductal dilatation or inflammation. Parenchymal atrophy without evidence of injury. Spleen: No splenic injury or perisplenic hematoma. Adrenals/Urinary Tract: No adrenal hemorrhage or renal injury identified. Multiple bilateral renal cysts. Absent renal excretion on delayed phase imaging suggest renal dysfunction. Possible solid lesion in the mid left kidney measuring 16 mm, series 3, image 62, not significantly changed from prior. Bladder is unremarkable. Stomach/Bowel: Moderate to large hiatal hernia. No evidence of bowel injury. No mesenteric hematoma. No bowel wall thickening. Distal colonic diverticulosis without diverticulitis. Appendix not confidently visualized. Vascular/Lymphatic: No vascular injury. Aorto bi-iliac atherosclerosis and tortuosity. No retroperitoneal fluid. The IVC is intact. Portal vein is patent. No suspicious adenopathy. Reproductive: Status post hysterectomy. No adnexal masses. Other: No free air or free fluid. Mild patchy contusion in the left flank.  Musculoskeletal: Chronic L1 compression fracture with vertebral augmentation. Hemangioma within L3 vertebral body. Multilevel degenerative change in the spine. Bony pelvis is intact. Degenerative change of the pubic symphysis. 1 IMPRESSION: 1. Minimally displaced left anterior sixth through ninth rib fractures. Small left pleural effusion and adjacent compressive atelectasis. No pneumothorax. 2. Subcutaneous contusion of the left lateral chest wall. 3. No additional acute traumatic injury to the chest, abdomen, or pelvis. 4. Moderate to large hiatal hernia with fluid-filled esophagus. Colonic diverticulosis. 5. Absent renal excretion on delayed phase imaging suggests renal dysfunction. Indeterminate lesion in the left mid kidney measuring 16 mm is unchanged from CT 1 year ago. Aortic Atherosclerosis (ICD10-I70.0). Electronically Signed   By: Keith Rake M.D.   On: 12/31/2018 00:37   Ct Cervical Spine Wo Contrast  Result Date: 12/31/2018 CLINICAL DATA:  Head trauma, ataxia EXAM: CT HEAD, cervical spine WITHOUT CONTRAST TECHNIQUE: Contiguous axial images were obtained from the base of the skull through the vertex, cervical spine without intravenous contrast. COMPARISON:  December 06, 2017 FINDINGS: Brain: No evidence of acute territorial infarction, hemorrhage, hydrocephalus,extra-axial collection or mass lesion/mass effect. There is dilatation the ventricles and sulci consistent with age-related atrophy. Low-attenuation changes in the deep white matter consistent with small vessel ischemia. Vascular: No hyperdense vessel or unexpected calcification. Skull: The skull is intact. No fracture or focal lesion identified. Sinuses/Orbits: The visualized paranasal sinuses are clear. There is fluid within the right mastoid air cell. The orbits and globes intact. Other: Unchanged small 1 cm in right parotid space nodule again identified. Cervical spine: Alignment: Physiologic Skull base and vertebrae: Visualized  skull base is intact. No atlanto-occipital dissociation. The vertebral body heights are well maintained. No fracture or pathologic osseous lesion seen. Soft tissues and spinal canal: The visualized paraspinal soft tissues are unremarkable. No prevertebral soft tissue swelling is seen. The spinal canal is grossly unremarkable, no large epidural collection or significant canal narrowing. Disc levels: Mild disc height loss with disc osteophyte and uncovertebral osteophytes  is most notable at C6-C7. Upper chest: Again noted is a calcified nodule within the right thyroid lobe. There is chronic left-sided pleural thickening with a posterior fourth healed rib fracture deformity. Other: None IMPRESSION: No acute intracranial abnormality. Findings consistent with age related atrophy and chronic small vessel ischemia Fluid within the right mastoid air cell as on prior exam. No acute fracture or malalignment of the spine. Electronically Signed   By: Prudencio Pair M.D.   On: 12/31/2018 00:42   Ct Abdomen Pelvis W Contrast  Result Date: 12/31/2018 CLINICAL DATA:  Fall with left-sided pain. Rib fractures on radiograph. EXAM: CT CHEST, ABDOMEN, AND PELVIS WITH CONTRAST TECHNIQUE: Multidetector CT imaging of the chest, abdomen and pelvis was performed following the standard protocol during bolus administration of intravenous contrast. CONTRAST:  47mL OMNIPAQUE IOHEXOL 300 MG/ML  SOLN COMPARISON:  Rib radiographs yesterday. Chest abdomen pelvis CT 12/06/2017 FINDINGS: CT CHEST FINDINGS Cardiovascular: No acute vascular injury. Aortic atherosclerosis and tortuosity. Normal heart size with coronary artery calcifications. Small pericardial effusion. Mediastinum/Nodes: No mediastinal hemorrhage or hematoma. No pneumomediastinum. Moderate hiatal hernia with fluid-filled esophagus. Peripherally calcified right thyroid nodule. No adenopathy. Lungs/Pleura: Small left pleural effusion and adjacent compressive atelectasis. No  pneumothorax. Minor dependent atelectasis in the right lower lobe with trace right pleural effusion. Lungs are otherwise clear. Trachea and mainstem bronchi are patent. Musculoskeletal: Motion artifact partially obscures the known left anterior sixth through ninth rib fractures (reported as 7 through tenth rib fractures on radiograph). No radiographically occult rib fracture. No fracture of the sternum or included clavicles and shoulder girdles. No acute fracture of the lumbar spine. Vertebral body hemangioma within T11. Subcutaneous contusion involving the left lateral chest wall. No subcutaneous emphysema. CT ABDOMEN PELVIS FINDINGS Hepatobiliary: No hepatic injury or perihepatic hematoma. Gallbladder is surgically absent. Pancreas: No ductal dilatation or inflammation. Parenchymal atrophy without evidence of injury. Spleen: No splenic injury or perisplenic hematoma. Adrenals/Urinary Tract: No adrenal hemorrhage or renal injury identified. Multiple bilateral renal cysts. Absent renal excretion on delayed phase imaging suggest renal dysfunction. Possible solid lesion in the mid left kidney measuring 16 mm, series 3, image 62, not significantly changed from prior. Bladder is unremarkable. Stomach/Bowel: Moderate to large hiatal hernia. No evidence of bowel injury. No mesenteric hematoma. No bowel wall thickening. Distal colonic diverticulosis without diverticulitis. Appendix not confidently visualized. Vascular/Lymphatic: No vascular injury. Aorto bi-iliac atherosclerosis and tortuosity. No retroperitoneal fluid. The IVC is intact. Portal vein is patent. No suspicious adenopathy. Reproductive: Status post hysterectomy. No adnexal masses. Other: No free air or free fluid. Mild patchy contusion in the left flank. Musculoskeletal: Chronic L1 compression fracture with vertebral augmentation. Hemangioma within L3 vertebral body. Multilevel degenerative change in the spine. Bony pelvis is intact. Degenerative change of  the pubic symphysis. 1 IMPRESSION: 1. Minimally displaced left anterior sixth through ninth rib fractures. Small left pleural effusion and adjacent compressive atelectasis. No pneumothorax. 2. Subcutaneous contusion of the left lateral chest wall. 3. No additional acute traumatic injury to the chest, abdomen, or pelvis. 4. Moderate to large hiatal hernia with fluid-filled esophagus. Colonic diverticulosis. 5. Absent renal excretion on delayed phase imaging suggests renal dysfunction. Indeterminate lesion in the left mid kidney measuring 16 mm is unchanged from CT 1 year ago. Aortic Atherosclerosis (ICD10-I70.0). Electronically Signed   By: Keith Rake M.D.   On: 12/31/2018 00:37    Review of Systems  Constitutional: Negative for chills and fever.  HENT: Negative.   Eyes: Negative.   Respiratory: Negative for  cough and shortness of breath.   Cardiovascular:       Left rib pain  Gastrointestinal: Negative for abdominal pain, nausea and vomiting.  Genitourinary: Negative.   Musculoskeletal: Negative.   Skin: Negative.   Neurological: Negative.   Endo/Heme/Allergies: Negative.   Psychiatric/Behavioral: Negative.    Blood pressure (!) 130/56, pulse 100, temperature 98.1 F (36.7 C), temperature source Oral, resp. rate 16, SpO2 99 %. Physical Exam  Constitutional: She is oriented to person, place, and time. She appears well-developed and well-nourished. No distress.  HENT:  Right Ear: External ear normal.  Left Ear: External ear normal.  Mouth/Throat: Oropharynx is clear and moist.  Eyes: Pupils are equal, round, and reactive to light. EOM are normal.  Neck: Neck supple.  Nontender  Cardiovascular: Normal rate, regular rhythm and normal heart sounds.  Respiratory: Effort normal and breath sounds normal. No respiratory distress. She has no wheezes. She exhibits tenderness.  Left rib tenderness without crepitance  GI: Soft. She exhibits no distension. There is no abdominal tenderness.  There is no rebound and no guarding.  Musculoskeletal:     Comments: Some edema bilateral lower extremities, no focal tenderness  Neurological: She is alert and oriented to person, place, and time. She displays no tremor. She exhibits normal muscle tone. She displays no seizure activity. GCS eye subscore is 4. GCS verbal subscore is 5. GCS motor subscore is 6.  Skin: Skin is warm.  Psychiatric: She has a normal mood and affect.    Assessment/Plan: Status post fall several days ago with left rib fracture 6-9.  Recommend multimodal pain control, pulmonary toilet.  We will follow-up.  Agree with medical admission for anemia work-up and treatment of multiple medical issues.  Zenovia Jarred 12/31/2018, 12:59 AM

## 2018-12-31 NOTE — ED Notes (Signed)
Tele   Breakfast ordered  

## 2018-12-31 NOTE — Progress Notes (Signed)
  Echocardiogram 2D Echocardiogram has been performed.  Diana [name] 12/31/2018, 12:13 PM

## 2018-12-31 NOTE — Progress Notes (Signed)
Subjective: CC: Patient complains of pain along the left chest wall. She has not received her IS yet. She denies any SOB.   Objective: Vital signs in last 24 hours: Temp:  [97.9 F (36.6 C)-98.8 F (37.1 C)] 98.8 F (37.1 C) (10/23 0505) Pulse Rate:  [86-103] 103 (10/23 1231) Resp:  [12-23] 23 (10/23 1231) BP: (106-164)/(56-99) 151/91 (10/23 1231) SpO2:  [95 %-100 %] 95 % (10/23 1231)    Intake/Output from previous day: No intake/output data recorded. Intake/Output this shift: No intake/output data recorded.  PE: Gen:  Alert, NAD, pleasant. Slurred speech Card:  Irr, Irr Pulm: CTA b/l, normal rate and effort. Left sided chest wall tenderness without crepitus Abd: Soft, NT/ND, +BS Ext:  Moves all extremities Skin: no rashes noted, warm and dry  Lab Results:  Recent Labs    12/30/18 2246  WBC 6.7  HGB 5.2*  HCT 20.0*  PLT 402*   BMET Recent Labs    12/30/18 2246  NA 131*  K 3.7  CL 99  CO2 21*  GLUCOSE 130*  BUN 16  CREATININE 1.17*  CALCIUM 8.0*   PT/INR No results for input(s): LABPROT, INR in the last 72 hours. CMP     Component Value Date/Time   NA 131 (L) 12/30/2018 2246   K 3.7 12/30/2018 2246   CL 99 12/30/2018 2246   CO2 21 (L) 12/30/2018 2246   GLUCOSE 130 (H) 12/30/2018 2246   BUN 16 12/30/2018 2246   CREATININE 1.17 (H) 12/30/2018 2246   CALCIUM 8.0 (L) 12/30/2018 2246   PROT 5.6 (L) 12/30/2018 2246   ALBUMIN 2.7 (L) 12/30/2018 2246   AST 25 12/30/2018 2246   ALT 19 12/30/2018 2246   ALKPHOS 70 12/30/2018 2246   BILITOT 1.4 (H) 12/30/2018 2246   GFRNONAA 43 (L) 12/30/2018 2246   GFRAA 50 (L) 12/30/2018 2246   Lipase     Component Value Date/Time   LIPASE 44 12/06/2017 1436       Studies/Results: Dg Ribs Unilateral W/chest Left  Result Date: 12/30/2018 CLINICAL DATA:  Left-sided rib pain fall EXAM: LEFT RIBS AND CHEST - 3+ VIEW COMPARISON:  07/12/2010 FINDINGS: Single-view chest demonstrates no acute airspace  disease. There may be small left pleural effusion. Mild cardiomegaly with aortic atherosclerosis. No pneumothorax. Clips in the right axillary region. Left rib series demonstrates old appearing left fifth and 6 rib fractures. Acute mildly displaced left seventh, eighth, ninth and tenth anterolateral rib fractures. IMPRESSION: 1. Acute displaced left seventh through tenth rib fractures with adjacent small left effusion. 2. Negative for pneumothorax 3. Cardiomegaly Electronically Signed   By: Donavan Foil M.D.   On: 12/30/2018 22:23   Ct Head Wo Contrast  Result Date: 12/31/2018 CLINICAL DATA:  Head trauma, ataxia EXAM: CT HEAD, cervical spine WITHOUT CONTRAST TECHNIQUE: Contiguous axial images were obtained from the base of the skull through the vertex, cervical spine without intravenous contrast. COMPARISON:  December 06, 2017 FINDINGS: Brain: No evidence of acute territorial infarction, hemorrhage, hydrocephalus,extra-axial collection or mass lesion/mass effect. There is dilatation the ventricles and sulci consistent with age-related atrophy. Low-attenuation changes in the deep white matter consistent with small vessel ischemia. Vascular: No hyperdense vessel or unexpected calcification. Skull: The skull is intact. No fracture or focal lesion identified. Sinuses/Orbits: The visualized paranasal sinuses are clear. There is fluid within the right mastoid air cell. The orbits and globes intact. Other: Unchanged small 1 cm in right parotid space nodule again identified. Cervical  spine: Alignment: Physiologic Skull base and vertebrae: Visualized skull base is intact. No atlanto-occipital dissociation. The vertebral body heights are well maintained. No fracture or pathologic osseous lesion seen. Soft tissues and spinal canal: The visualized paraspinal soft tissues are unremarkable. No prevertebral soft tissue swelling is seen. The spinal canal is grossly unremarkable, no large epidural collection or significant  canal narrowing. Disc levels: Mild disc height loss with disc osteophyte and uncovertebral osteophytes is most notable at C6-C7. Upper chest: Again noted is a calcified nodule within the right thyroid lobe. There is chronic left-sided pleural thickening with a posterior fourth healed rib fracture deformity. Other: None IMPRESSION: No acute intracranial abnormality. Findings consistent with age related atrophy and chronic small vessel ischemia Fluid within the right mastoid air cell as on prior exam. No acute fracture or malalignment of the spine. Electronically Signed   By: Prudencio Pair M.D.   On: 12/31/2018 00:42   Ct Chest W Contrast  Result Date: 12/31/2018 CLINICAL DATA:  Fall with left-sided pain. Rib fractures on radiograph. EXAM: CT CHEST, ABDOMEN, AND PELVIS WITH CONTRAST TECHNIQUE: Multidetector CT imaging of the chest, abdomen and pelvis was performed following the standard protocol during bolus administration of intravenous contrast. CONTRAST:  53mL OMNIPAQUE IOHEXOL 300 MG/ML  SOLN COMPARISON:  Rib radiographs yesterday. Chest abdomen pelvis CT 12/06/2017 FINDINGS: CT CHEST FINDINGS Cardiovascular: No acute vascular injury. Aortic atherosclerosis and tortuosity. Normal heart size with coronary artery calcifications. Small pericardial effusion. Mediastinum/Nodes: No mediastinal hemorrhage or hematoma. No pneumomediastinum. Moderate hiatal hernia with fluid-filled esophagus. Peripherally calcified right thyroid nodule. No adenopathy. Lungs/Pleura: Small left pleural effusion and adjacent compressive atelectasis. No pneumothorax. Minor dependent atelectasis in the right lower lobe with trace right pleural effusion. Lungs are otherwise clear. Trachea and mainstem bronchi are patent. Musculoskeletal: Motion artifact partially obscures the known left anterior sixth through ninth rib fractures (reported as 7 through tenth rib fractures on radiograph). No radiographically occult rib fracture. No fracture of  the sternum or included clavicles and shoulder girdles. No acute fracture of the lumbar spine. Vertebral body hemangioma within T11. Subcutaneous contusion involving the left lateral chest wall. No subcutaneous emphysema. CT ABDOMEN PELVIS FINDINGS Hepatobiliary: No hepatic injury or perihepatic hematoma. Gallbladder is surgically absent. Pancreas: No ductal dilatation or inflammation. Parenchymal atrophy without evidence of injury. Spleen: No splenic injury or perisplenic hematoma. Adrenals/Urinary Tract: No adrenal hemorrhage or renal injury identified. Multiple bilateral renal cysts. Absent renal excretion on delayed phase imaging suggest renal dysfunction. Possible solid lesion in the mid left kidney measuring 16 mm, series 3, image 62, not significantly changed from prior. Bladder is unremarkable. Stomach/Bowel: Moderate to large hiatal hernia. No evidence of bowel injury. No mesenteric hematoma. No bowel wall thickening. Distal colonic diverticulosis without diverticulitis. Appendix not confidently visualized. Vascular/Lymphatic: No vascular injury. Aorto bi-iliac atherosclerosis and tortuosity. No retroperitoneal fluid. The IVC is intact. Portal vein is patent. No suspicious adenopathy. Reproductive: Status post hysterectomy. No adnexal masses. Other: No free air or free fluid. Mild patchy contusion in the left flank. Musculoskeletal: Chronic L1 compression fracture with vertebral augmentation. Hemangioma within L3 vertebral body. Multilevel degenerative change in the spine. Bony pelvis is intact. Degenerative change of the pubic symphysis. 1 IMPRESSION: 1. Minimally displaced left anterior sixth through ninth rib fractures. Small left pleural effusion and adjacent compressive atelectasis. No pneumothorax. 2. Subcutaneous contusion of the left lateral chest wall. 3. No additional acute traumatic injury to the chest, abdomen, or pelvis. 4. Moderate to large hiatal hernia with fluid-filled  esophagus. Colonic  diverticulosis. 5. Absent renal excretion on delayed phase imaging suggests renal dysfunction. Indeterminate lesion in the left mid kidney measuring 16 mm is unchanged from CT 1 year ago. Aortic Atherosclerosis (ICD10-I70.0). Electronically Signed   By: Keith Rake M.D.   On: 12/31/2018 00:37   Ct Cervical Spine Wo Contrast  Result Date: 12/31/2018 CLINICAL DATA:  Head trauma, ataxia EXAM: CT HEAD, cervical spine WITHOUT CONTRAST TECHNIQUE: Contiguous axial images were obtained from the base of the skull through the vertex, cervical spine without intravenous contrast. COMPARISON:  December 06, 2017 FINDINGS: Brain: No evidence of acute territorial infarction, hemorrhage, hydrocephalus,extra-axial collection or mass lesion/mass effect. There is dilatation the ventricles and sulci consistent with age-related atrophy. Low-attenuation changes in the deep white matter consistent with small vessel ischemia. Vascular: No hyperdense vessel or unexpected calcification. Skull: The skull is intact. No fracture or focal lesion identified. Sinuses/Orbits: The visualized paranasal sinuses are clear. There is fluid within the right mastoid air cell. The orbits and globes intact. Other: Unchanged small 1 cm in right parotid space nodule again identified. Cervical spine: Alignment: Physiologic Skull base and vertebrae: Visualized skull base is intact. No atlanto-occipital dissociation. The vertebral body heights are well maintained. No fracture or pathologic osseous lesion seen. Soft tissues and spinal canal: The visualized paraspinal soft tissues are unremarkable. No prevertebral soft tissue swelling is seen. The spinal canal is grossly unremarkable, no large epidural collection or significant canal narrowing. Disc levels: Mild disc height loss with disc osteophyte and uncovertebral osteophytes is most notable at C6-C7. Upper chest: Again noted is a calcified nodule within the right thyroid lobe. There is chronic  left-sided pleural thickening with a posterior fourth healed rib fracture deformity. Other: None IMPRESSION: No acute intracranial abnormality. Findings consistent with age related atrophy and chronic small vessel ischemia Fluid within the right mastoid air cell as on prior exam. No acute fracture or malalignment of the spine. Electronically Signed   By: Prudencio Pair M.D.   On: 12/31/2018 00:42   Ct Abdomen Pelvis W Contrast  Result Date: 12/31/2018 CLINICAL DATA:  Fall with left-sided pain. Rib fractures on radiograph. EXAM: CT CHEST, ABDOMEN, AND PELVIS WITH CONTRAST TECHNIQUE: Multidetector CT imaging of the chest, abdomen and pelvis was performed following the standard protocol during bolus administration of intravenous contrast. CONTRAST:  52mL OMNIPAQUE IOHEXOL 300 MG/ML  SOLN COMPARISON:  Rib radiographs yesterday. Chest abdomen pelvis CT 12/06/2017 FINDINGS: CT CHEST FINDINGS Cardiovascular: No acute vascular injury. Aortic atherosclerosis and tortuosity. Normal heart size with coronary artery calcifications. Small pericardial effusion. Mediastinum/Nodes: No mediastinal hemorrhage or hematoma. No pneumomediastinum. Moderate hiatal hernia with fluid-filled esophagus. Peripherally calcified right thyroid nodule. No adenopathy. Lungs/Pleura: Small left pleural effusion and adjacent compressive atelectasis. No pneumothorax. Minor dependent atelectasis in the right lower lobe with trace right pleural effusion. Lungs are otherwise clear. Trachea and mainstem bronchi are patent. Musculoskeletal: Motion artifact partially obscures the known left anterior sixth through ninth rib fractures (reported as 7 through tenth rib fractures on radiograph). No radiographically occult rib fracture. No fracture of the sternum or included clavicles and shoulder girdles. No acute fracture of the lumbar spine. Vertebral body hemangioma within T11. Subcutaneous contusion involving the left lateral chest wall. No subcutaneous  emphysema. CT ABDOMEN PELVIS FINDINGS Hepatobiliary: No hepatic injury or perihepatic hematoma. Gallbladder is surgically absent. Pancreas: No ductal dilatation or inflammation. Parenchymal atrophy without evidence of injury. Spleen: No splenic injury or perisplenic hematoma. Adrenals/Urinary Tract: No adrenal hemorrhage or renal injury  identified. Multiple bilateral renal cysts. Absent renal excretion on delayed phase imaging suggest renal dysfunction. Possible solid lesion in the mid left kidney measuring 16 mm, series 3, image 62, not significantly changed from prior. Bladder is unremarkable. Stomach/Bowel: Moderate to large hiatal hernia. No evidence of bowel injury. No mesenteric hematoma. No bowel wall thickening. Distal colonic diverticulosis without diverticulitis. Appendix not confidently visualized. Vascular/Lymphatic: No vascular injury. Aorto bi-iliac atherosclerosis and tortuosity. No retroperitoneal fluid. The IVC is intact. Portal vein is patent. No suspicious adenopathy. Reproductive: Status post hysterectomy. No adnexal masses. Other: No free air or free fluid. Mild patchy contusion in the left flank. Musculoskeletal: Chronic L1 compression fracture with vertebral augmentation. Hemangioma within L3 vertebral body. Multilevel degenerative change in the spine. Bony pelvis is intact. Degenerative change of the pubic symphysis. 1 IMPRESSION: 1. Minimally displaced left anterior sixth through ninth rib fractures. Small left pleural effusion and adjacent compressive atelectasis. No pneumothorax. 2. Subcutaneous contusion of the left lateral chest wall. 3. No additional acute traumatic injury to the chest, abdomen, or pelvis. 4. Moderate to large hiatal hernia with fluid-filled esophagus. Colonic diverticulosis. 5. Absent renal excretion on delayed phase imaging suggests renal dysfunction. Indeterminate lesion in the left mid kidney measuring 16 mm is unchanged from CT 1 year ago. Aortic Atherosclerosis  (ICD10-I70.0). Electronically Signed   By: Keith Rake M.D.   On: 12/31/2018 00:37    Anti-infectives: Anti-infectives (From admission, onward)   None      Assessment/Plan HTN HLD Hypothyroidism DM2 Tobacco use OSA Remote Hx of Breast CA Hyponatremia  Elevated Cr Hypoalbuminemia  Left Kidney Lesion on CT Symptomatic anemia  Hypoglycemia New onset A. Fib - above per medicine -   Left anterior sixth through ninth rib fractures - Pain control. 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 PT already on board.  FEN - HH/CM VTE - SCDs, chemical prophylaxis on hold for anemia  ID - None    LOS: 0 days    Jillyn Ledger , [facility] 12/31/2018, 2:23 PM Pager: [phone]

## 2018-12-31 NOTE — H&P (View-Only) (Signed)
[facility] Consult: 1:20 PM 12/31/2018  LOS: 0 days    Referring Provider: Dr Florene Glen  Primary Care Physician:  [name], PA-C Primary Gastroenterologist:  Delfin Edis    Reason for Consultation:  Anemia.     HPI: Diana [name] is a 85 y.o. female.  Hx OSA.  Hypertension.  Hyperlipidemia.  Hypothyroidism.  Obesity.  CKD.  bil breast cancer, bil mastectomy 2001  Cholecystectomy 1974.  Bowel resection 1974, no details on this surgery. 07/2013 colonoscopy.  For surveillance of adenomatous polyps.  Multiple polyps in 2002 and 03/2012.  Moderate pandiverticulosis.  8 semipedunculated and sessile polyps, 3 to 15 mm in size, found/removed throughout the colon (tubular adenomas, TVA.  no high-grade dysplasia)  Progressive decline over the last few months.  Increasing difficulty taking care of herself.  At baseline she is wheelchair-bound and she has been losing her balance and falling down during attempts to transfer from wheelchair to bed.  When she falls she is so weak its been hard for her to even get back up.  Presented to ED yesterday when she had fallen and was too weak to get up.  No LOC X-rays confirm left-sided rib fractures.  New onset atrial fibrillation. Hb 5.2, MCV 62.   FOBT negative Mild elevation of creatinine to 1.1, normal BUN. Na 131 otherwise normal electrolytes.   T bili slightly elevated at 1.4 and albumin low at 2.7 but otherwise LFTs normal. Covid 19 negative. CTAP with contrast shows moderate to large hiatal hernia with fluid-filled esophagus.  Colonic diverticulosis.  Non-GI findings include rib fractures, small left pleural effusion, left chest wall contusion.  Stable indeterminant 67mm left kidney lesion.  Absent renal excretion suggests renal dysfunction  Has received 2  PRBCs, await follow-up CBC  Patient has never previously received red cell transfusion.  Remotely took iron supplements.  Appetite is fair.  No nausea, vomiting, abdominal pain, dysphagia.  Chronic constipation.  Takes Metamucil at least 2 times, sometimes 3 times a day but still is constipated and requires self disimpaction.  BMs are nonbloody and occur 3 to 4 days a week.  Drinks very little water.  Eats chicken livers regularly.  No excessive or unusual bleeding or bruising.  She is lost an undetermined amount of weight but at least 30 pounds in the last couple of years, more than 10 pounds in the last year.   Past Medical History:  Diagnosis Date   Anemia    Anemia of decreased vitamin B12 absorption 05/26/2005   Arthritis    Breast cancer ([place]) 2001   Cataract    History of tobacco abuse    Hx of colonic polyps    Hyperlipidemia    Hypertension    Hypothyroidism    Obesity    OSA on CPAP 09/2007   AHI 10.6/hr overall, 43.64/hr during REM   Osteopenia    Renal insufficiency    Type 2 diabetes mellitus ([place])     Past Surgical History:  Procedure Laterality Date   BACK SURGERY  05/30/2009   BOWEL RESECTION  1974   CATARACT EXTRACTION Bilateral    bilateral   CHOLECYSTECTOMY  1974   MASTECTOMY Bilateral 2001   bilateral sentinel lymph nodes bio   NM MYOCAR PERF WALL MOTION  06/2007   dipyridamole; perfusion defect in inferior myocardium consistent with diaphragmatic attenuation, remaining myocardium with no ischemia/infarct, EF 73%; normal, low risk scan    TOTAL ABDOMINAL HYSTERECTOMY  1975   TRANSTHORACIC ECHOCARDIOGRAM  02/2010   123456, stage 1 diastolic dysfunction; borderline RV enlargement; LA mild-mod dilated; mild mitral annular calcif & mild MR; mild TR with normal RSVP, AV moderately sclerotics    Prior to Admission medications   Medication Sig Start Date End Date Taking? Authorizing Provider  Apoaequorin (PREVAGEN PO) Take 1 capsule by mouth  daily.   Yes [provider]  aspirin 81 MG tablet Take 81 mg by mouth 2 (two) times daily.    Yes [provider]  COMBIGAN 0.2-0.5 % ophthalmic solution Place 1 drop into both eyes every 12 (twelve) hours.  01/13/12  Yes [provider]  FLUoxetine (PROZAC) 20 MG capsule Take 20 mg by mouth daily.    Yes [provider]  glimepiride (AMARYL) 2 MG tablet Take 2 mg by mouth daily.    Yes [provider]  insulin glargine (LANTUS) 100 UNIT/ML injection Inject 20 Units into the skin at bedtime. Sliding scale   Yes [provider]  levothyroxine (SYNTHROID, LEVOTHROID) 50 MCG tablet Take 50 mcg by mouth daily.     Yes [provider]  losartan (COZAAR) 100 MG tablet Take 1 tablet (100 mg total) by mouth daily. NEEDS APPOINTMENT FOR FUTURE REFILLS Patient taking differently: Take 100 mg by mouth daily.  12/22/18  Yes Troy Sine, MD  Multiple Vitamins-Minerals (PRESERVISION AREDS 2) CAPS Take 2 capsules by mouth daily.   Yes [provider]  rosuvastatin (CRESTOR) 20 MG tablet Take 20 mg by mouth daily.   Yes [provider]  traMADol (ULTRAM) 50 MG tablet Take 1 tablet (50 mg total) by mouth every 6 (six) hours as needed for moderate pain. Patient taking differently: Take 50 mg by mouth 3 (three) times daily as needed for moderate pain.  12/08/17  Yes Drenda Freeze, MD  verapamil (CALAN-SR) 240 MG CR tablet Take 1 tablet (240 mg total) by mouth at bedtime. Please keep upcoming appt for further refills. 11/05/17  Yes Troy Sine, MD    Scheduled Meds:  sodium chloride   Intravenous Once   brimonidine  1 drop Both Eyes BID   And   timolol  1 drop Both Eyes BID   FLUoxetine  20 mg Oral Daily   levothyroxine  50 mcg Oral Daily   losartan  100 mg Oral Daily   rosuvastatin  20 mg Oral Daily   verapamil  240 mg Oral QHS   Infusions:  PRN Meds: acetaminophen **OR** acetaminophen, ondansetron **OR**  ondansetron (ZOFRAN) IV   Allergies as of 12/30/2018 - Review Complete 12/30/2018  Allergen Reaction Noted   Codeine sulfate Nausea Only 01/02/2011    Family History  Problem Relation Age of Onset   Heart attack Mother    Heart attack Father    Colon cancer Neg Hx     Social History   Socioeconomic History   Marital status: Married    Spouse name: Not on file   Number of children: Not on file   Years of education: Not on file   Highest education level: Not on file  Occupational History   Not on file  Social Needs   Financial resource strain: Not on file   Food insecurity    Worry: Not on file    Inability: Not on file   Transportation needs    Medical: Not on file    Non-medical: Not on file  Tobacco Use   Smoking status: Former Smoker    Years: 13.00    Types: Cigarettes    Quit date: 05/02/2002    Years since quitting: 16.6   Smokeless tobacco: Never Used  Substance and Sexual Activity   Alcohol use: No    Alcohol/week: 0.0 standard drinks   Drug use: Never   Sexual activity: Not on file  Lifestyle   Physical activity    Days per week: Not on file    Minutes per session: Not on file   Stress: Not on file  Relationships   Social connections    Talks on phone: Not on file    Gets together: Not on file    Attends religious service: Not on file    Active member of club or organization: Not on file    Attends meetings of clubs or organizations: Not on file    Relationship status: Not on file   Intimate partner violence    Fear of current or ex partner: Not on file    Emotionally abused: Not on file    Physically abused: Not on file    Forced sexual activity: Not on file  Other Topics Concern   Not on file  Social History Narrative   Not on file    REVIEW OF SYSTEMS: Constitutional: Weakness. ENT:  No nose bleeds Pulm: No current shortness of breath. CV:  No palpitations, no LE edema.  No cough, no dyspnea. GU: Incontinent of  urine.  No frequency or hematuria. GI: See HPI Heme: See HPI Transfusions: See HPI Neuro:  No headaches, no peripheral tingling or numbness.  Syncope.  No seizures. Derm:  No itching, no rash or sores.  Endocrine:  No sweats or chills.  No polyuria or dysuria Immunization: No records of previous or current immunizations. Travel:  None beyond local counties in last few months.    PHYSICAL EXAM: Vital signs in last 24 hours: Vitals:   12/31/18 1015 12/31/18 1231  BP: (!) 145/87 (!) 151/91  Pulse:  (!) 103  Resp: 20 (!) 23  Temp:    SpO2:  95%   Wt Readings from Last 3 Encounters:  12/06/17 102.1 kg  12/04/16 106.6 kg  09/04/16 107.5 kg    General: Obese, chronically ill looking, pale WF.  Comfortable, alert. Head: No facial asymmetry or swelling.  No signs of head trauma. Eyes: No scleral icterus.  No conjunctival pallor.  EOMI. Ears: Not hard of hearing Nose: No congestion or discharge Mouth: Oral mucosa moist, pink, clear.  Fair dentition.  Tongue midline.  Smile symmetric. Neck: No JVD, no masses, no thyromegaly Lungs: Clear bilaterally.  No labored breathing or cough. Bilateral mastectomies, no reconstructive surgery.  ICD on left anterior shoulder region. Heart: Slightly irregular, rate in the 90s. Abdomen: Obese.  Soft.  Active bowel sounds.  No HSM, masses, or organomegaly, bruits..  GU: Smells strongly of urine Rectal: Deferred.  Stool tested FOBT negative in the lab late last night. Musc/Skeltl: No joint redness or swelling.  Do not see bruising in the left thorax. Extremities: Slight, nonpitting pedal edema. Neurologic: Alert.  Oriented x3.  Speech a little bit slurred but  accurate.  Moves all 4 limbs, no tremor.  Symmetric weakness on flexion at elbow. Skin: No rash, no sores, no bruising. Nodes: No cervical adenopathy Psych: cooperative, pleasant  Intake/Output from previous day: No intake/output data recorded. Intake/Output this shift: No intake/output  data recorded.  LAB RESULTS: Recent Labs    12/30/18 2246  WBC 6.7  HGB 5.2*  HCT 20.0*  PLT 402*   BMET Lab Results  Component Value Date   NA 131 (L) 12/30/2018   NA 141 12/06/2017   NA 139 03/18/2011   K 3.7 12/30/2018   K 3.9 12/06/2017   K 3.7 03/18/2011   CL 99 12/30/2018   CL 109 12/06/2017   CL 102 03/18/2011   CO2 21 (L) 12/30/2018   CO2 23 12/06/2017   CO2 26 03/18/2011   GLUCOSE 130 (H) 12/30/2018   GLUCOSE 50 (L) 12/06/2017   GLUCOSE 281 (H) 03/18/2011   BUN 16 12/30/2018   BUN 36 (H) 12/06/2017   BUN 27 (H) 03/18/2011   CREATININE 1.17 (H) 12/30/2018   CREATININE 1.33 (H) 12/06/2017   CREATININE 1.70 (H) 03/18/2011   CALCIUM 8.0 (L) 12/30/2018   CALCIUM 8.3 (L) 12/06/2017   CALCIUM 8.4 03/18/2011   LFT Recent Labs    12/30/18 2246  PROT 5.6*  ALBUMIN 2.7*  AST 25  ALT 19  ALKPHOS 70  BILITOT 1.4*   PT/INR Lab Results  Component Value Date   INR 1.09 12/06/2017   INR 1.11 05/28/2009   Hepatitis Panel No results for input(s): HEPBSAG, HCVAB, HEPAIGM, HEPBIGM in the last 72 hours. C-Diff No components found for: CDIFF Lipase     Component Value Date/Time   LIPASE 44 12/06/2017 1436    Drugs of Abuse  No results found for: LABOPIA, COCAINSCRNUR, LABBENZ, AMPHETMU, THCU, LABBARB   RADIOLOGY STUDIES: Dg Ribs Unilateral W/chest Left  Result Date: 12/30/2018 CLINICAL DATA:  Left-sided rib pain fall EXAM: LEFT RIBS AND CHEST - 3+ VIEW COMPARISON:  07/12/2010 FINDINGS: Single-view chest demonstrates no acute airspace disease. There may be small left pleural effusion. Mild cardiomegaly with aortic atherosclerosis. No pneumothorax. Clips in the right axillary region. Left rib series demonstrates old appearing left fifth and 6 rib fractures. Acute mildly displaced left seventh, eighth, ninth and tenth anterolateral rib fractures. IMPRESSION: 1. Acute displaced left seventh through tenth rib fractures with adjacent small left effusion. 2.  Negative for pneumothorax 3. Cardiomegaly Electronically Signed   By: Donavan Foil M.D.   On: 12/30/2018 22:23   Ct Head Wo Contrast  Result Date: 12/31/2018 CLINICAL DATA:  Head trauma, ataxia EXAM: CT HEAD, cervical spine WITHOUT CONTRAST TECHNIQUE: Contiguous axial images were obtained from the base of the skull through the vertex, cervical spine without intravenous contrast. COMPARISON:  December 06, 2017 FINDINGS: Brain: No evidence of acute territorial infarction, hemorrhage, hydrocephalus,extra-axial collection or mass lesion/mass effect. There is dilatation the ventricles and sulci consistent with age-related atrophy. Low-attenuation changes in the deep white matter consistent with small vessel ischemia. Vascular: No hyperdense vessel or unexpected calcification. Skull: The skull is intact. No fracture or focal lesion identified. Sinuses/Orbits: The visualized paranasal sinuses are clear. There is fluid within the right mastoid air cell. The orbits and globes intact. Other: Unchanged small 1 cm in right parotid space nodule again identified. Cervical spine: Alignment: Physiologic Skull base and vertebrae: Visualized skull base is intact. No atlanto-occipital dissociation. The vertebral body heights are well maintained. No fracture or pathologic osseous lesion seen. Soft tissues and spinal  canal: The visualized paraspinal soft tissues are unremarkable. No prevertebral soft tissue swelling is seen. The spinal canal is grossly unremarkable, no large epidural collection or significant canal narrowing. Disc levels: Mild disc height loss with disc osteophyte and uncovertebral osteophytes is most notable at C6-C7. Upper chest: Again noted is a calcified nodule within the right thyroid lobe. There is chronic left-sided pleural thickening with a posterior fourth healed rib fracture deformity. Other: None IMPRESSION: No acute intracranial abnormality. Findings consistent with age related atrophy and chronic  small vessel ischemia Fluid within the right mastoid air cell as on prior exam. No acute fracture or malalignment of the spine. Electronically Signed   By: Prudencio Pair M.D.   On: 12/31/2018 00:42   Ct Chest W Contrast  Result Date: 12/31/2018 CLINICAL DATA:  Fall with left-sided pain. Rib fractures on radiograph. EXAM: CT CHEST, ABDOMEN, AND PELVIS WITH CONTRAST TECHNIQUE: Multidetector CT imaging of the chest, abdomen and pelvis was performed following the standard protocol during bolus administration of intravenous contrast. CONTRAST:  70mL OMNIPAQUE IOHEXOL 300 MG/ML  SOLN COMPARISON:  Rib radiographs yesterday. Chest abdomen pelvis CT 12/06/2017 FINDINGS: CT CHEST FINDINGS Cardiovascular: No acute vascular injury. Aortic atherosclerosis and tortuosity. Normal heart size with coronary artery calcifications. Small pericardial effusion. Mediastinum/Nodes: No mediastinal hemorrhage or hematoma. No pneumomediastinum. Moderate hiatal hernia with fluid-filled esophagus. Peripherally calcified right thyroid nodule. No adenopathy. Lungs/Pleura: Small left pleural effusion and adjacent compressive atelectasis. No pneumothorax. Minor dependent atelectasis in the right lower lobe with trace right pleural effusion. Lungs are otherwise clear. Trachea and mainstem bronchi are patent. Musculoskeletal: Motion artifact partially obscures the known left anterior sixth through ninth rib fractures (reported as 7 through tenth rib fractures on radiograph). No radiographically occult rib fracture. No fracture of the sternum or included clavicles and shoulder girdles. No acute fracture of the lumbar spine. Vertebral body hemangioma within T11. Subcutaneous contusion involving the left lateral chest wall. No subcutaneous emphysema. CT ABDOMEN PELVIS FINDINGS Hepatobiliary: No hepatic injury or perihepatic hematoma. Gallbladder is surgically absent. Pancreas: No ductal dilatation or inflammation. Parenchymal atrophy without  evidence of injury. Spleen: No splenic injury or perisplenic hematoma. Adrenals/Urinary Tract: No adrenal hemorrhage or renal injury identified. Multiple bilateral renal cysts. Absent renal excretion on delayed phase imaging suggest renal dysfunction. Possible solid lesion in the mid left kidney measuring 16 mm, series 3, image 62, not significantly changed from prior. Bladder is unremarkable. Stomach/Bowel: Moderate to large hiatal hernia. No evidence of bowel injury. No mesenteric hematoma. No bowel wall thickening. Distal colonic diverticulosis without diverticulitis. Appendix not confidently visualized. Vascular/Lymphatic: No vascular injury. Aorto bi-iliac atherosclerosis and tortuosity. No retroperitoneal fluid. The IVC is intact. Portal vein is patent. No suspicious adenopathy. Reproductive: Status post hysterectomy. No adnexal masses. Other: No free air or free fluid. Mild patchy contusion in the left flank. Musculoskeletal: Chronic L1 compression fracture with vertebral augmentation. Hemangioma within L3 vertebral body. Multilevel degenerative change in the spine. Bony pelvis is intact. Degenerative change of the pubic symphysis. 1 IMPRESSION: 1. Minimally displaced left anterior sixth through ninth rib fractures. Small left pleural effusion and adjacent compressive atelectasis. No pneumothorax. 2. Subcutaneous contusion of the left lateral chest wall. 3. No additional acute traumatic injury to the chest, abdomen, or pelvis. 4. Moderate to large hiatal hernia with fluid-filled esophagus. Colonic diverticulosis. 5. Absent renal excretion on delayed phase imaging suggests renal dysfunction. Indeterminate lesion in the left mid kidney measuring 16 mm is unchanged from CT 1 year ago. Aortic Atherosclerosis (  ICD10-I70.0). Electronically Signed   By: Keith Rake M.D.   On: 12/31/2018 00:37   Ct Cervical Spine Wo Contrast  Result Date: 12/31/2018 CLINICAL DATA:  Head trauma, ataxia EXAM: CT HEAD,  cervical spine WITHOUT CONTRAST TECHNIQUE: Contiguous axial images were obtained from the base of the skull through the vertex, cervical spine without intravenous contrast. COMPARISON:  December 06, 2017 FINDINGS: Brain: No evidence of acute territorial infarction, hemorrhage, hydrocephalus,extra-axial collection or mass lesion/mass effect. There is dilatation the ventricles and sulci consistent with age-related atrophy. Low-attenuation changes in the deep white matter consistent with small vessel ischemia. Vascular: No hyperdense vessel or unexpected calcification. Skull: The skull is intact. No fracture or focal lesion identified. Sinuses/Orbits: The visualized paranasal sinuses are clear. There is fluid within the right mastoid air cell. The orbits and globes intact. Other: Unchanged small 1 cm in right parotid space nodule again identified. Cervical spine: Alignment: Physiologic Skull base and vertebrae: Visualized skull base is intact. No atlanto-occipital dissociation. The vertebral body heights are well maintained. No fracture or pathologic osseous lesion seen. Soft tissues and spinal canal: The visualized paraspinal soft tissues are unremarkable. No prevertebral soft tissue swelling is seen. The spinal canal is grossly unremarkable, no large epidural collection or significant canal narrowing. Disc levels: Mild disc height loss with disc osteophyte and uncovertebral osteophytes is most notable at C6-C7. Upper chest: Again noted is a calcified nodule within the right thyroid lobe. There is chronic left-sided pleural thickening with a posterior fourth healed rib fracture deformity. Other: None IMPRESSION: No acute intracranial abnormality. Findings consistent with age related atrophy and chronic small vessel ischemia Fluid within the right mastoid air cell as on prior exam. No acute fracture or malalignment of the spine. Electronically Signed   By: Prudencio Pair M.D.   On: 12/31/2018 00:42   Ct Abdomen Pelvis  W Contrast  Result Date: 12/31/2018 CLINICAL DATA:  Fall with left-sided pain. Rib fractures on radiograph. EXAM: CT CHEST, ABDOMEN, AND PELVIS WITH CONTRAST TECHNIQUE: Multidetector CT imaging of the chest, abdomen and pelvis was performed following the standard protocol during bolus administration of intravenous contrast. CONTRAST:  56mL OMNIPAQUE IOHEXOL 300 MG/ML  SOLN COMPARISON:  Rib radiographs yesterday. Chest abdomen pelvis CT 12/06/2017 FINDINGS: CT CHEST FINDINGS Cardiovascular: No acute vascular injury. Aortic atherosclerosis and tortuosity. Normal heart size with coronary artery calcifications. Small pericardial effusion. Mediastinum/Nodes: No mediastinal hemorrhage or hematoma. No pneumomediastinum. Moderate hiatal hernia with fluid-filled esophagus. Peripherally calcified right thyroid nodule. No adenopathy. Lungs/Pleura: Small left pleural effusion and adjacent compressive atelectasis. No pneumothorax. Minor dependent atelectasis in the right lower lobe with trace right pleural effusion. Lungs are otherwise clear. Trachea and mainstem bronchi are patent. Musculoskeletal: Motion artifact partially obscures the known left anterior sixth through ninth rib fractures (reported as 7 through tenth rib fractures on radiograph). No radiographically occult rib fracture. No fracture of the sternum or included clavicles and shoulder girdles. No acute fracture of the lumbar spine. Vertebral body hemangioma within T11. Subcutaneous contusion involving the left lateral chest wall. No subcutaneous emphysema. CT ABDOMEN PELVIS FINDINGS Hepatobiliary: No hepatic injury or perihepatic hematoma. Gallbladder is surgically absent. Pancreas: No ductal dilatation or inflammation. Parenchymal atrophy without evidence of injury. Spleen: No splenic injury or perisplenic hematoma. Adrenals/Urinary Tract: No adrenal hemorrhage or renal injury identified. Multiple bilateral renal cysts. Absent renal excretion on delayed phase  imaging suggest renal dysfunction. Possible solid lesion in the mid left kidney measuring 16 mm, series 3, image 62, not significantly changed  from prior. Bladder is unremarkable. Stomach/Bowel: Moderate to large hiatal hernia. No evidence of bowel injury. No mesenteric hematoma. No bowel wall thickening. Distal colonic diverticulosis without diverticulitis. Appendix not confidently visualized. Vascular/Lymphatic: No vascular injury. Aorto bi-iliac atherosclerosis and tortuosity. No retroperitoneal fluid. The IVC is intact. Portal vein is patent. No suspicious adenopathy. Reproductive: Status post hysterectomy. No adnexal masses. Other: No free air or free fluid. Mild patchy contusion in the left flank. Musculoskeletal: Chronic L1 compression fracture with vertebral augmentation. Hemangioma within L3 vertebral body. Multilevel degenerative change in the spine. Bony pelvis is intact. Degenerative change of the pubic symphysis. 1 IMPRESSION: 1. Minimally displaced left anterior sixth through ninth rib fractures. Small left pleural effusion and adjacent compressive atelectasis. No pneumothorax. 2. Subcutaneous contusion of the left lateral chest wall. 3. No additional acute traumatic injury to the chest, abdomen, or pelvis. 4. Moderate to large hiatal hernia with fluid-filled esophagus. Colonic diverticulosis. 5. Absent renal excretion on delayed phase imaging suggests renal dysfunction. Indeterminate lesion in the left mid kidney measuring 16 mm is unchanged from CT 1 year ago. Aortic Atherosclerosis (ICD10-I70.0). Electronically Signed   By: Keith Rake M.D.   On: 12/31/2018 00:37     IMPRESSION:   *      Microcytic anemia.  *     History adenomatous and serrated adenomatous colon polyps.  Last colonoscopy in 2015 with recurrent adenomas.  No recall colonoscopy suggested due to patient age. Chronic constipation despite regular doses Metamucil.  *     New onset A. Fib.  Rate controlled with verapamil.   Decision not yet made regarding initiating long-term anticoagulation.  *     Physically debilitated.  Mostly spends her time in a wheelchair in bed.  She is able to take steps using cane or other devices for balance when necessary but prefers to avoid walking according to her home caretaker who is in the room today.   PLAN:     *   Patient is quite frail.  I do not think that colonoscopy would be advisable.  She could certainly tolerate EGD.  Dr. [name] will be rounding on her and making decisions as to further work-up.  *     Await pending TSH, iron, B12, folate testing  *      Add MiraLAX daily.     Azucena Freed  12/31/2018, 1:20 PM Phone [phone]

## 2018-12-31 NOTE — ED Notes (Signed)
ED TO INPATIENT HANDOFF REPORT  ED Nurse Name and Phone #: Annie Main [phone] Name/Age/Gender Diana [name] 85 y.o. female Room/Bed: [id number]  Code Status   Code Status: Full Code  Home/SNF/Other Home Patient oriented to: self, place, time and situation Is this baseline? Yes   Triage Complete: Triage complete  Chief Complaint hypoglycemia  Triage Note Pt. BIB [facility] from home with home health RN. EMS reports call out r/t weakness and unable to get up. Pt has also had recent falls and decreased appetite today.   Upon EMS arrival pt. Was very lethargic with initial CBG *44. Given 250 ml of D10. Repeat CBG 151.   EMS VS EKG a fib  BP 129/77 HR 100 RR 19 SpO2 99%      Allergies Allergies  Allergen Reactions  . Codeine Sulfate Nausea Only    Level of Care/Admitting Diagnosis ED Disposition    ED Disposition Condition Comment   Admit  Hospital Area: [facility] [[id number]]  Level of Care: Telemetry Medical [104]  I expect the patient will be discharged within 24 hours: No (not a candidate for 5C-Observation unit)  Covid Evaluation: Asymptomatic Screening Protocol (No Symptoms)  Diagnosis: Symptomatic anemia [id number]  Admitting Physician: [name] [phone]  Attending Physician: [name] [email or web address]  PT Class (Do Not Modify): Observation [104]  PT Acc Code (Do Not Modify): Observation [10022]       B Medical/Surgery History Past Medical History:  Diagnosis Date  . Anemia   . Anemia of decreased vitamin B12 absorption 05/26/2005  . Arthritis   . Breast cancer ([place]) 2001  . Cataract   . History of tobacco abuse   . Hx of colonic polyps   . Hyperlipidemia   . Hypertension   . Hypothyroidism   . Obesity   . OSA on CPAP 09/2007   AHI 10.6/hr overall, 43.64/hr during REM  . Osteopenia   . Renal insufficiency   . Type 2 diabetes mellitus ([place])    Past Surgical History:  Procedure Laterality Date  . BACK SURGERY   05/30/2009  . BOWEL RESECTION  1974  . CATARACT EXTRACTION Bilateral    bilateral  . CHOLECYSTECTOMY  1974  . MASTECTOMY Bilateral 2001   bilateral sentinel lymph nodes bio  . NM MYOCAR PERF WALL MOTION  06/2007   dipyridamole; perfusion defect in inferior myocardium consistent with diaphragmatic attenuation, remaining myocardium with no ischemia/infarct, EF 73%; normal, low risk scan   . TOTAL ABDOMINAL HYSTERECTOMY  1975  . TRANSTHORACIC ECHOCARDIOGRAM  02/2010   123456, stage 1 diastolic dysfunction; borderline RV enlargement; LA mild-mod dilated; mild mitral annular calcif & mild MR; mild TR with normal RSVP, AV moderately sclerotics     A IV Location/Drains/Wounds Patient Lines/Drains/Airways Status   Active Line/Drains/Airways    Name:   Placement date:   Placement time:   Site:   Days:   Peripheral IV 12/30/18 Left Antecubital   12/30/18    2104    Antecubital   1   External Urinary Catheter   12/06/17    2159    -   390          Intake/Output Last 24 hours No intake or output data in the 24 hours ending 12/31/18 1727  Labs/Imaging Results for orders placed or performed during the hospital encounter of 12/30/18 (from the past 48 hour(s))  CBG monitoring, ED     Status: Abnormal   Collection Time: 12/30/18  9:08 PM  Result Value Ref Range   Glucose-Capillary 132 (H) 70 - 99 mg/dL  Comprehensive metabolic panel     Status: Abnormal   Collection Time: 12/30/18 10:46 PM  Result Value Ref Range   Sodium 131 (L) 135 - 145 mmol/L   Potassium 3.7 3.5 - 5.1 mmol/L   Chloride 99 98 - 111 mmol/L   CO2 21 (L) 22 - 32 mmol/L   Glucose, Bld 130 (H) 70 - 99 mg/dL   BUN 16 8 - 23 mg/dL   Creatinine, Ser 1.17 (H) 0.44 - 1.00 mg/dL   Calcium 8.0 (L) 8.9 - 10.3 mg/dL   Total Protein 5.6 (L) 6.5 - 8.1 g/dL   Albumin 2.7 (L) 3.5 - 5.0 g/dL   AST 25 15 - 41 U/L   ALT 19 0 - 44 U/L   Alkaline Phosphatase 70 38 - 126 U/L   Total Bilirubin 1.4 (H) 0.3 - 1.2 mg/dL   GFR calc non Af  Amer 43 (L) >60 mL/min   GFR calc Af Amer 50 (L) >60 mL/min   Anion gap 11 5 - 15    Comment: Performed at [facility], [place] [address], [place], [place] [address]  CBC with Differential     Status: Abnormal   Collection Time: 12/30/18 10:46 PM  Result Value Ref Range   WBC 6.7 4.0 - 10.5 K/uL   RBC 3.18 (L) 3.87 - 5.11 MIL/uL   Hemoglobin 5.2 (LL) 12.0 - 15.0 g/dL    Comment: REPEATED TO VERIFY Reticulocyte Hemoglobin testing may be clinically indicated, consider ordering this additional test [id number] THIS CRITICAL RESULT HAS VERIFIED AND BEEN CALLED TO RN WILSON SHANIECE BY MESSAN HOUEGNIFIO ON 10 22 2020 AT 2316, AND HAS BEEN READ BACK.     HCT 20.0 (L) 36.0 - 46.0 %   MCV 62.9 (L) 80.0 - 100.0 fL   MCH 16.4 (L) 26.0 - 34.0 pg   MCHC 26.0 (L) 30.0 - 36.0 g/dL   RDW 21.3 (H) 11.5 - 15.5 %   Platelets 402 (H) 150 - 400 K/uL   nRBC 1.1 (H) 0.0 - 0.2 %   Neutrophils Relative % 80 %   Neutro Abs 5.4 1.7 - 7.7 K/uL   Lymphocytes Relative 7 %   Lymphs Abs 0.5 (L) 0.7 - 4.0 K/uL   Monocytes Relative 12 %   Monocytes Absolute 0.8 0.1 - 1.0 K/uL   Eosinophils Relative 0 %   Eosinophils Absolute 0.0 0.0 - 0.5 K/uL   Basophils Relative 0 %   Basophils Absolute 0.0 0.0 - 0.1 K/uL   Immature Granulocytes 1 %   Abs Immature Granulocytes 0.04 0.00 - 0.07 K/uL    Comment: Performed at [facility], [place] [address] [place], [place] [address]  CBG monitoring, ED     Status: Abnormal   Collection Time: 12/30/18 11:22 PM  Result Value Ref Range   Glucose-Capillary 128 (H) 70 - 99 mg/dL  Type and screen [place]     Status: None (Preliminary result)   Collection Time: 12/30/18 11:25 PM  Result Value Ref Range   ABO/RH(D) A POS    Antibody Screen NEG    Sample Expiration 01/02/2019,2359    Unit Number [id number]    Blood Component Type RED CELLS,LR    Unit division 00    Status of Unit ISSUED    Transfusion Status OK TO TRANSFUSE    Crossmatch Result       Compatible Performed  at [facility], [place] [address], [place], [place] [address]    Unit Number [id number]    Blood Component Type RED CELLS,LR    Unit division 00    Status of Unit ISSUED    Transfusion Status OK TO TRANSFUSE    Crossmatch Result Compatible   Prepare RBC     Status: None   Collection Time: 12/30/18 11:25 PM  Result Value Ref Range   Order Confirmation      ORDER PROCESSED BY BLOOD BANK Performed at [facility], [place] [address] [place], [place] [address]   ABO/Rh     Status: None   Collection Time: 12/30/18 11:25 PM  Result Value Ref Range   ABO/RH(D)      A POS Performed at [facility] [address] [place], [place] [address]   SARS CORONAVIRUS 2 (TAT 6-24 HRS) Nasopharyngeal Nasopharyngeal Swab     Status: None   Collection Time: 12/30/18 11:32 PM   Specimen: Nasopharyngeal Swab  Result Value Ref Range   SARS Coronavirus 2 NEGATIVE NEGATIVE    Comment: (NOTE) SARS-CoV-2 target nucleic acids are NOT DETECTED. The SARS-CoV-2 RNA is generally detectable in upper and lower respiratory specimens during the acute phase of infection. Negative results do not preclude SARS-CoV-2 infection, do not rule out co-infections with other pathogens, and should not be used as the sole basis for treatment or other patient management decisions. Negative results must be combined with clinical observations, patient history, and epidemiological information. The expected result is Negative. Fact Sheet for Patients: SugarRoll.be Fact Sheet for Healthcare Providers: https://www.woods-mathews.com/ This test is not yet approved or cleared by the [place] FDA and  has been authorized for detection and/or diagnosis of SARS-CoV-2 by FDA under an Emergency Use Authorization (EUA). This EUA will remain  in effect (meaning this test can be used) for the duration of the COVID-19 declaration under Section  56 4(b)(1) of the Act, 21 U.S.C. section 360bbb-3(b)(1), unless the authorization is terminated or revoked sooner. Performed at [facility], [place] [address] [place], [place] [address]   POC occult blood, ED     Status: None   Collection Time: 12/30/18 11:49 PM  Result Value Ref Range   Fecal Occult Bld NEGATIVE NEGATIVE  CBG monitoring, ED     Status: Abnormal   Collection Time: 12/31/18  2:41 AM  Result Value Ref Range   Glucose-Capillary 152 (H) 70 - 99 mg/dL  CBG monitoring, ED     Status: None   Collection Time: 12/31/18 10:32 AM  Result Value Ref Range   Glucose-Capillary 81 70 - 99 mg/dL   Comment 1 Notify RN    Comment 2 Document in Chart   CBG monitoring, ED     Status: Abnormal   Collection Time: 12/31/18  3:35 PM  Result Value Ref Range   Glucose-Capillary 191 (H) 70 - 99 mg/dL   Dg Ribs Unilateral W/chest Left  Result Date: 12/30/2018 CLINICAL DATA:  Left-sided rib pain fall EXAM: LEFT RIBS AND CHEST - 3+ VIEW COMPARISON:  07/12/2010 FINDINGS: Single-view chest demonstrates no acute airspace disease. There may be small left pleural effusion. Mild cardiomegaly with aortic atherosclerosis. No pneumothorax. Clips in the right axillary region. Left rib series demonstrates old appearing left fifth and 6 rib fractures. Acute mildly displaced left seventh, eighth, ninth and tenth anterolateral rib fractures. IMPRESSION: 1. Acute displaced left seventh through tenth rib fractures with adjacent small left effusion. 2. Negative  for pneumothorax 3. Cardiomegaly Electronically Signed   By: Donavan Foil M.D.   On: 12/30/2018 22:23   Ct Head Wo Contrast  Result Date: 12/31/2018 CLINICAL DATA:  Head trauma, ataxia EXAM: CT HEAD, cervical spine WITHOUT CONTRAST TECHNIQUE: Contiguous axial images were obtained from the base of the skull through the vertex, cervical spine without intravenous contrast. COMPARISON:  December 06, 2017 FINDINGS: Brain: No evidence of acute territorial  infarction, hemorrhage, hydrocephalus,extra-axial collection or mass lesion/mass effect. There is dilatation the ventricles and sulci consistent with age-related atrophy. Low-attenuation changes in the deep white matter consistent with small vessel ischemia. Vascular: No hyperdense vessel or unexpected calcification. Skull: The skull is intact. No fracture or focal lesion identified. Sinuses/Orbits: The visualized paranasal sinuses are clear. There is fluid within the right mastoid air cell. The orbits and globes intact. Other: Unchanged small 1 cm in right parotid space nodule again identified. Cervical spine: Alignment: Physiologic Skull base and vertebrae: Visualized skull base is intact. No atlanto-occipital dissociation. The vertebral body heights are well maintained. No fracture or pathologic osseous lesion seen. Soft tissues and spinal canal: The visualized paraspinal soft tissues are unremarkable. No prevertebral soft tissue swelling is seen. The spinal canal is grossly unremarkable, no large epidural collection or significant canal narrowing. Disc levels: Mild disc height loss with disc osteophyte and uncovertebral osteophytes is most notable at C6-C7. Upper chest: Again noted is a calcified nodule within the right thyroid lobe. There is chronic left-sided pleural thickening with a posterior fourth healed rib fracture deformity. Other: None IMPRESSION: No acute intracranial abnormality. Findings consistent with age related atrophy and chronic small vessel ischemia Fluid within the right mastoid air cell as on prior exam. No acute fracture or malalignment of the spine. Electronically Signed   By: Prudencio Pair M.D.   On: 12/31/2018 00:42   Ct Chest W Contrast  Result Date: 12/31/2018 CLINICAL DATA:  Fall with left-sided pain. Rib fractures on radiograph. EXAM: CT CHEST, ABDOMEN, AND PELVIS WITH CONTRAST TECHNIQUE: Multidetector CT imaging of the chest, abdomen and pelvis was performed following the  standard protocol during bolus administration of intravenous contrast. CONTRAST:  51mL OMNIPAQUE IOHEXOL 300 MG/ML  SOLN COMPARISON:  Rib radiographs yesterday. Chest abdomen pelvis CT 12/06/2017 FINDINGS: CT CHEST FINDINGS Cardiovascular: No acute vascular injury. Aortic atherosclerosis and tortuosity. Normal heart size with coronary artery calcifications. Small pericardial effusion. Mediastinum/Nodes: No mediastinal hemorrhage or hematoma. No pneumomediastinum. Moderate hiatal hernia with fluid-filled esophagus. Peripherally calcified right thyroid nodule. No adenopathy. Lungs/Pleura: Small left pleural effusion and adjacent compressive atelectasis. No pneumothorax. Minor dependent atelectasis in the right lower lobe with trace right pleural effusion. Lungs are otherwise clear. Trachea and mainstem bronchi are patent. Musculoskeletal: Motion artifact partially obscures the known left anterior sixth through ninth rib fractures (reported as 7 through tenth rib fractures on radiograph). No radiographically occult rib fracture. No fracture of the sternum or included clavicles and shoulder girdles. No acute fracture of the lumbar spine. Vertebral body hemangioma within T11. Subcutaneous contusion involving the left lateral chest wall. No subcutaneous emphysema. CT ABDOMEN PELVIS FINDINGS Hepatobiliary: No hepatic injury or perihepatic hematoma. Gallbladder is surgically absent. Pancreas: No ductal dilatation or inflammation. Parenchymal atrophy without evidence of injury. Spleen: No splenic injury or perisplenic hematoma. Adrenals/Urinary Tract: No adrenal hemorrhage or renal injury identified. Multiple bilateral renal cysts. Absent renal excretion on delayed phase imaging suggest renal dysfunction. Possible solid lesion in the mid left kidney measuring 16 mm, series 3, image 62, not significantly changed  from prior. Bladder is unremarkable. Stomach/Bowel: Moderate to large hiatal hernia. No evidence of bowel injury.  No mesenteric hematoma. No bowel wall thickening. Distal colonic diverticulosis without diverticulitis. Appendix not confidently visualized. Vascular/Lymphatic: No vascular injury. Aorto bi-iliac atherosclerosis and tortuosity. No retroperitoneal fluid. The IVC is intact. Portal vein is patent. No suspicious adenopathy. Reproductive: Status post hysterectomy. No adnexal masses. Other: No free air or free fluid. Mild patchy contusion in the left flank. Musculoskeletal: Chronic L1 compression fracture with vertebral augmentation. Hemangioma within L3 vertebral body. Multilevel degenerative change in the spine. Bony pelvis is intact. Degenerative change of the pubic symphysis. 1 IMPRESSION: 1. Minimally displaced left anterior sixth through ninth rib fractures. Small left pleural effusion and adjacent compressive atelectasis. No pneumothorax. 2. Subcutaneous contusion of the left lateral chest wall. 3. No additional acute traumatic injury to the chest, abdomen, or pelvis. 4. Moderate to large hiatal hernia with fluid-filled esophagus. Colonic diverticulosis. 5. Absent renal excretion on delayed phase imaging suggests renal dysfunction. Indeterminate lesion in the left mid kidney measuring 16 mm is unchanged from CT 1 year ago. Aortic Atherosclerosis (ICD10-I70.0). Electronically Signed   By: Keith Rake M.D.   On: 12/31/2018 00:37   Ct Cervical Spine Wo Contrast  Result Date: 12/31/2018 CLINICAL DATA:  Head trauma, ataxia EXAM: CT HEAD, cervical spine WITHOUT CONTRAST TECHNIQUE: Contiguous axial images were obtained from the base of the skull through the vertex, cervical spine without intravenous contrast. COMPARISON:  December 06, 2017 FINDINGS: Brain: No evidence of acute territorial infarction, hemorrhage, hydrocephalus,extra-axial collection or mass lesion/mass effect. There is dilatation the ventricles and sulci consistent with age-related atrophy. Low-attenuation changes in the deep white matter  consistent with small vessel ischemia. Vascular: No hyperdense vessel or unexpected calcification. Skull: The skull is intact. No fracture or focal lesion identified. Sinuses/Orbits: The visualized paranasal sinuses are clear. There is fluid within the right mastoid air cell. The orbits and globes intact. Other: Unchanged small 1 cm in right parotid space nodule again identified. Cervical spine: Alignment: Physiologic Skull base and vertebrae: Visualized skull base is intact. No atlanto-occipital dissociation. The vertebral body heights are well maintained. No fracture or pathologic osseous lesion seen. Soft tissues and spinal canal: The visualized paraspinal soft tissues are unremarkable. No prevertebral soft tissue swelling is seen. The spinal canal is grossly unremarkable, no large epidural collection or significant canal narrowing. Disc levels: Mild disc height loss with disc osteophyte and uncovertebral osteophytes is most notable at C6-C7. Upper chest: Again noted is a calcified nodule within the right thyroid lobe. There is chronic left-sided pleural thickening with a posterior fourth healed rib fracture deformity. Other: None IMPRESSION: No acute intracranial abnormality. Findings consistent with age related atrophy and chronic small vessel ischemia Fluid within the right mastoid air cell as on prior exam. No acute fracture or malalignment of the spine. Electronically Signed   By: Prudencio Pair M.D.   On: 12/31/2018 00:42   Ct Abdomen Pelvis W Contrast  Result Date: 12/31/2018 CLINICAL DATA:  Fall with left-sided pain. Rib fractures on radiograph. EXAM: CT CHEST, ABDOMEN, AND PELVIS WITH CONTRAST TECHNIQUE: Multidetector CT imaging of the chest, abdomen and pelvis was performed following the standard protocol during bolus administration of intravenous contrast. CONTRAST:  3mL OMNIPAQUE IOHEXOL 300 MG/ML  SOLN COMPARISON:  Rib radiographs yesterday. Chest abdomen pelvis CT 12/06/2017 FINDINGS: CT CHEST  FINDINGS Cardiovascular: No acute vascular injury. Aortic atherosclerosis and tortuosity. Normal heart size with coronary artery calcifications. Small pericardial effusion. Mediastinum/Nodes: No mediastinal  hemorrhage or hematoma. No pneumomediastinum. Moderate hiatal hernia with fluid-filled esophagus. Peripherally calcified right thyroid nodule. No adenopathy. Lungs/Pleura: Small left pleural effusion and adjacent compressive atelectasis. No pneumothorax. Minor dependent atelectasis in the right lower lobe with trace right pleural effusion. Lungs are otherwise clear. Trachea and mainstem bronchi are patent. Musculoskeletal: Motion artifact partially obscures the known left anterior sixth through ninth rib fractures (reported as 7 through tenth rib fractures on radiograph). No radiographically occult rib fracture. No fracture of the sternum or included clavicles and shoulder girdles. No acute fracture of the lumbar spine. Vertebral body hemangioma within T11. Subcutaneous contusion involving the left lateral chest wall. No subcutaneous emphysema. CT ABDOMEN PELVIS FINDINGS Hepatobiliary: No hepatic injury or perihepatic hematoma. Gallbladder is surgically absent. Pancreas: No ductal dilatation or inflammation. Parenchymal atrophy without evidence of injury. Spleen: No splenic injury or perisplenic hematoma. Adrenals/Urinary Tract: No adrenal hemorrhage or renal injury identified. Multiple bilateral renal cysts. Absent renal excretion on delayed phase imaging suggest renal dysfunction. Possible solid lesion in the mid left kidney measuring 16 mm, series 3, image 62, not significantly changed from prior. Bladder is unremarkable. Stomach/Bowel: Moderate to large hiatal hernia. No evidence of bowel injury. No mesenteric hematoma. No bowel wall thickening. Distal colonic diverticulosis without diverticulitis. Appendix not confidently visualized. Vascular/Lymphatic: No vascular injury. Aorto bi-iliac atherosclerosis and  tortuosity. No retroperitoneal fluid. The IVC is intact. Portal vein is patent. No suspicious adenopathy. Reproductive: Status post hysterectomy. No adnexal masses. Other: No free air or free fluid. Mild patchy contusion in the left flank. Musculoskeletal: Chronic L1 compression fracture with vertebral augmentation. Hemangioma within L3 vertebral body. Multilevel degenerative change in the spine. Bony pelvis is intact. Degenerative change of the pubic symphysis. 1 IMPRESSION: 1. Minimally displaced left anterior sixth through ninth rib fractures. Small left pleural effusion and adjacent compressive atelectasis. No pneumothorax. 2. Subcutaneous contusion of the left lateral chest wall. 3. No additional acute traumatic injury to the chest, abdomen, or pelvis. 4. Moderate to large hiatal hernia with fluid-filled esophagus. Colonic diverticulosis. 5. Absent renal excretion on delayed phase imaging suggests renal dysfunction. Indeterminate lesion in the left mid kidney measuring 16 mm is unchanged from CT 1 year ago. Aortic Atherosclerosis (ICD10-I70.0). Electronically Signed   By: Keith Rake M.D.   On: 12/31/2018 00:37    Pending Labs Unresulted Labs (From admission, onward)    Start     Ordered   12/31/18 1336  TSH  Add-on,   AD     12/31/18 1335   12/31/18 1332  Brain natriuretic peptide  Once,   STAT     12/31/18 1333   12/31/18 1150  Reticulocytes  Add-on,   AD     12/31/18 1149   12/31/18 0759  Vitamin B12  Add-on,   AD     12/31/18 0758   12/31/18 0759  Folate  Add-on,   AD     12/31/18 0758   12/31/18 0758  Iron and TIBC  Add-on,   AD     12/31/18 0758   12/31/18 0758  Ferritin  Add-on,   AD     12/31/18 0758   12/31/18 0757  CBC  Once,   STAT     12/31/18 0756   12/31/18 0757  Comprehensive metabolic panel  Once,   STAT     12/31/18 0756   12/30/18 2119  Urinalysis, Routine w reflex microscopic  ONCE - STAT,   STAT     12/30/18 2119  Vitals/Pain Today's Vitals    12/31/18 1015 12/31/18 1231 12/31/18 1245 12/31/18 1300  BP: (!) 145/87 (!) 151/91 (!) 164/99 (!) 172/95  Pulse:  (!) 103 (!) 128 (!) 130  Resp: 20 (!) 23 (!) 23 (!) 26  Temp:      TempSrc:      SpO2:  95% 96% 96%  PainSc:        Isolation Precautions No active isolations  Medications Medications  0.9 %  sodium chloride infusion (Manually program via Guardrails IV Fluids) ( Intravenous Not Given 12/31/18 0047)  rosuvastatin (CRESTOR) tablet 20 mg (20 mg Oral Given 12/31/18 1246)  FLUoxetine (PROZAC) capsule 20 mg (20 mg Oral Given 12/31/18 1246)  levothyroxine (SYNTHROID) tablet 50 mcg (50 mcg Oral Given 12/31/18 1246)  acetaminophen (TYLENOL) tablet 650 mg (has no administration in time range)    Or  acetaminophen (TYLENOL) suppository 650 mg (has no administration in time range)  ondansetron (ZOFRAN) tablet 4 mg (has no administration in time range)    Or  ondansetron (ZOFRAN) injection 4 mg (has no administration in time range)  verapamil (CALAN-SR) CR tablet 240 mg (has no administration in time range)  losartan (COZAAR) tablet 100 mg (100 mg Oral Given 12/31/18 1246)  brimonidine (ALPHAGAN) 0.2 % ophthalmic solution 1 drop (0 drops Both Eyes Hold 12/31/18 1433)    And  timolol (TIMOPTIC) 0.5 % ophthalmic solution 1 drop (0 drops Both Eyes Hold 12/31/18 1433)  polyethylene glycol (MIRALAX / GLYCOLAX) packet 17 g (17 g Oral Given 12/31/18 1433)  iohexol (OMNIPAQUE) 300 MG/ML solution 75 mL (75 mLs Intravenous Contrast Given 12/30/18 2353)    Mobility walks with person assist High fall risk   Focused Assessments    R Recommendations: See Admitting Provider Note  Report given to:   Additional Notes:

## 2018-12-31 NOTE — ED Provider Notes (Signed)
Patient care signed out at end of shift by Eustaquio Maize, PA-C.   Patient found today by home health RN with hypoglycemia, CBG 44. She reports multiple recent falls and now has a "catch" in left chest. No fever, SOB, AP, nausea, diarrhea.   Work up included trauma scans after multiple rib fractures visualized on chest x ray. CT scans show no PTX, minimally displaced rib fx's left 6-9. Hgb found to be 5.2. Transfusion started.   Trauma surgeon, Dr. Grandville Silos, consulted but advises anemia is not likely due to trauma. His team will follow but medicine to admit.   Hospitalist paged.  Dr. Hal Hope accepting.    Charlann Lange, PA-C 12/31/18 0103    Wyvonnia Dusky, MD 01/01/19 1154

## 2018-12-31 NOTE — Consult Note (Addendum)
The patient has been seen in conjunction with Gilberto Better, MD. All aspects of care have been considered and discussed. The patient has been personally interviewed, examined, and all clinical data has been reviewed.   The patient was interviewed and examined. Slurred speech. Some dyspnea.  Agree with diagnosis of New Atrial Fibrillation, with rate control due to Verapamil. Dose can be up-titrated if needed.  No evidence CHF or hemodynamic compromise associated with AF  Echocardiogram with no significant structural abnormality and EF > 60%.  CHADS VASC > 4. Stroke risk > 7.5%/year.  HAS-BLED score 3. Bleeding risk 3.7-5.8%. Weigh risk versus benefit of long-term anticoagulation. If iron deficient, will need to determine if GI blood loss or nutritional.  Severe anemia without evidence of active bleeding.  Iron studies do not suggest iron deficiency.  Frequent falls with injury would not be a contraindication for anticoagulation.  BNP. Consider IV lasix with transfusion to avoid acute diastolic HF.    Cardiology Consultation:   Patient ID: Diana [name]; [id number]; 05/27/1933   Admit date: 12/30/2018 Date of Consult: 12/31/2018  Primary Care Provider: [name], PA-C Primary Cardiologist: [facility] Primary Electrophysiologist:  N/A   Patient Profile:   Diana [name] is a 85 y.o. female with a hx of HTN, HLD, IDDM, Hypothyroidism, CKD, Obesity and OSA who is being seen today for the evaluation of Atrial Fibrillation at the request of Dr.Powell.  History of Present Illness:   Ms. Diana [name] was examined and evaluated at bedside in the ED this am. She was observed resting comfortably in bed this am. She mentions that she has had progressive decline over the last couple months with having difficulty taking care of herself. She mentions that she has no family nearby and has been taking care of herself at home but has noted that she is losing ability to do so. She  mentions her most difficulty task to be transferring from her bed to her wheelchair as she keeps losing balance and falling down. She recounts hx of multiple falls with development of rib pain after a recent fall. She mentions feeling 'too weak' to get up from a fall on her own and mentions she is beginning to feel unable to care for herself. She also mentions that she tries to administer her own medications but admits she often forgets.  Per chart review, she has no history of arryhthmias. She has had prior echo in [id number] w/ normal systolic function w/ normal diastolic parameters and mild aortic stenosis. She also had an echo in 2011 which showed normal systolic function at that time as well. She had no further cardiac work-up in the past.  Past Medical History:  Diagnosis Date   Anemia    Anemia of decreased vitamin B12 absorption 05/26/2005   Arthritis    Breast cancer ([place]) 2001   Cataract    History of tobacco abuse    Hx of colonic polyps    Hyperlipidemia    Hypertension    Hypothyroidism    Obesity    OSA on CPAP 09/2007   AHI 10.6/hr overall, 43.64/hr during REM   Osteopenia    Renal insufficiency    Type 2 diabetes mellitus ([place])     Past Surgical History:  Procedure Laterality Date   BACK SURGERY  05/30/2009   BOWEL RESECTION  1974   CATARACT EXTRACTION Bilateral    bilateral   CHOLECYSTECTOMY  1974   MASTECTOMY Bilateral 2001   bilateral sentinel lymph nodes bio  NM MYOCAR PERF WALL MOTION  06/2007   dipyridamole; perfusion defect in inferior myocardium consistent with diaphragmatic attenuation, remaining myocardium with no ischemia/infarct, EF 73%; normal, low risk scan    TOTAL ABDOMINAL HYSTERECTOMY  1975   TRANSTHORACIC ECHOCARDIOGRAM  02/2010   123456, stage 1 diastolic dysfunction; borderline RV enlargement; LA mild-mod dilated; mild mitral annular calcif & mild MR; mild TR with normal RSVP, AV moderately sclerotics     Home Medications:    Prior to Admission medications   Medication Sig Start Date End Date Taking? Authorizing Provider  Apoaequorin (PREVAGEN PO) Take 1 capsule by mouth daily.   Yes [provider]  aspirin 81 MG tablet Take 81 mg by mouth 2 (two) times daily.    Yes [provider]  COMBIGAN 0.2-0.5 % ophthalmic solution Place 1 drop into both eyes every 12 (twelve) hours.  01/13/12  Yes [provider]  FLUoxetine (PROZAC) 20 MG capsule Take 20 mg by mouth daily.    Yes [provider]  glimepiride (AMARYL) 2 MG tablet Take 2 mg by mouth daily.    Yes [provider]  insulin glargine (LANTUS) 100 UNIT/ML injection Inject 20 Units into the skin at bedtime. Sliding scale   Yes [provider]  levothyroxine (SYNTHROID, LEVOTHROID) 50 MCG tablet Take 50 mcg by mouth daily.     Yes [provider]  losartan (COZAAR) 100 MG tablet Take 1 tablet (100 mg total) by mouth daily. NEEDS APPOINTMENT FOR FUTURE REFILLS Patient taking differently: Take 100 mg by mouth daily.  12/22/18  Yes Troy Sine, MD  Multiple Vitamins-Minerals (PRESERVISION AREDS 2) CAPS Take 2 capsules by mouth daily.   Yes [provider]  rosuvastatin (CRESTOR) 20 MG tablet Take 20 mg by mouth daily.   Yes [provider]  traMADol (ULTRAM) 50 MG tablet Take 1 tablet (50 mg total) by mouth every 6 (six) hours as needed for moderate pain. Patient taking differently: Take 50 mg by mouth 3 (three) times daily as needed for moderate pain.  12/08/17  Yes Drenda Freeze, MD  verapamil (CALAN-SR) 240 MG CR tablet Take 1 tablet (240 mg total) by mouth at bedtime. Please keep upcoming appt for further refills. 11/05/17   Troy Sine, MD    Inpatient Medications: Scheduled Meds:  sodium chloride   Intravenous Once   brimonidine  1 drop Both Eyes BID   And   timolol  1 drop Both Eyes BID   FLUoxetine  20 mg Oral Daily   levothyroxine  50 mcg Oral Daily    losartan  100 mg Oral Daily   rosuvastatin  20 mg Oral Daily   verapamil  240 mg Oral QHS   Continuous Infusions:  PRN Meds: acetaminophen **OR** acetaminophen, ondansetron **OR** ondansetron (ZOFRAN) IV  Allergies:    Allergies  Allergen Reactions   Codeine Sulfate Nausea Only    Social History:   Denies any tobacco, alcohol, IVDU. Lives at home by herself but has 'friends' that check up on her  Social History   Socioeconomic History   Marital status: Married    Spouse name: Not on file   Number of children: Not on file   Years of education: Not on file   Highest education level: Not on file  Occupational History   Not on file  Social Needs   Financial resource strain: Not on file   Food insecurity    Worry: Not on file    Inability:  Not on file   Transportation needs    Medical: Not on file    Non-medical: Not on file  Tobacco Use   Smoking status: Former Smoker    Years: 13.00    Types: Cigarettes    Quit date: 05/02/2002    Years since quitting: 16.6   Smokeless tobacco: Never Used  Substance and Sexual Activity   Alcohol use: No    Alcohol/week: 0.0 standard drinks   Drug use: Never   Sexual activity: Not on file  Lifestyle   Physical activity    Days per week: Not on file    Minutes per session: Not on file   Stress: Not on file  Relationships   Social connections    Talks on phone: Not on file    Gets together: Not on file    Attends religious service: Not on file    Active member of club or organization: Not on file    Attends meetings of clubs or organizations: Not on file    Relationship status: Not on file   Intimate partner violence    Fear of current or ex partner: Not on file    Emotionally abused: Not on file    Physically abused: Not on file    Forced sexual activity: Not on file  Other Topics Concern   Not on file  Social History Narrative   Not on file    Family History:   Mother, Father, Brother had 'heart  issues' but at later age.  Family History  Problem Relation Age of Onset   Heart attack Mother    Heart attack Father    Colon cancer Neg Hx      ROS:  Please see the history of present illness.  Review of Systems  Constitution: Positive for decreased appetite. Negative for chills and malaise/fatigue.  Cardiovascular: Negative for chest pain and irregular heartbeat.  Respiratory: Negative for shortness of breath and wheezing.   Gastrointestinal: Negative for constipation, diarrhea, nausea and vomiting.  Neurological: Positive for weakness. Negative for light-headedness and loss of balance.    All other ROS reviewed and negative.     Physical Exam/Data:   Vitals:   12/31/18 0437 12/31/18 0505 12/31/18 0815 12/31/18 0830  BP: (!) 157/90 (!) 144/83 (!) 156/99 (!) 164/96  Pulse: (!) 101 99 88 91  Resp: 16 16 12 20   Temp: 98.5 F (36.9 C) 98.8 F (37.1 C)    TempSrc: Oral Oral    SpO2: 99% 98% 98% 98%   No intake or output data in the 24 hours ending 12/31/18 1147 There were no vitals filed for this visit. There is no height or weight on file to calculate BMI.  General:  Well nourished, obese, NAD HEENT: EOMI, hearing intact,  Neck: no JVD Vascular: + carotid bruits; FA pulses 2+ bilaterally without bruits  Cardiac:  normal S1, S2; Irregularly irregular; 2/6 systolic murmur loudest at RSB w/ radiation to left  Lungs:  clear to auscultation bilaterally, no wheezing, rhonchi or rales  Abd: soft, nontender, no hepatomegaly  Ext: trace pitting edema Musculoskeletal:  No deformities, BUE and BLE strength normal and equal Skin: warm and dry  Neuro:  CNs 2-12 intact, no focal abnormalities noted Psych:  Normal affect   EKG:  The EKG was personally reviewed and demonstrates:  Irregularly irregular, normal sinus, no significant ST changes  Telemetry:  Telemetry was personally reviewed and demonstrates:  A.fib with HR ~100-110  Relevant CV Studies:  In 2009  a nuclear  perfusion study showed diaphragmatic attenuation but was without ischemia. An echo Doppler study in 2011 showed normal systolic function with grade 1 diastolic dysfunction. She had mild to moderate LA dilatation, mild/moderate annular calcification with mild MR, mild TR, and also had moderately sclerotic aortic valve with peak and mean gradients of 15 and 7 mm, respectively.  Since I saw her one year ago, she underwent a four-year follow-up echo Doppler study in May 2015.  This revealed an ejection fraction of 55-60%.  Left reticular diastolic parameters were normal.  There was trivial aortic insufficiency with mild aortic valve sclerosis without stenosis.  The peak gradient was 13 mm.   Laboratory Data:  Chemistry Recent Labs  Lab 12/30/18 2246  NA 131*  K 3.7  CL 99  CO2 21*  GLUCOSE 130*  BUN 16  CREATININE 1.17*  CALCIUM 8.0*  GFRNONAA 43*  GFRAA 50*  ANIONGAP 11    Recent Labs  Lab 12/30/18 2246  PROT 5.6*  ALBUMIN 2.7*  AST 25  ALT 19  ALKPHOS 70  BILITOT 1.4*   Hematology Recent Labs  Lab 12/30/18 2246  WBC 6.7  RBC 3.18*  HGB 5.2*  HCT 20.0*  MCV 62.9*  MCH 16.4*  MCHC 26.0*  RDW 21.3*  PLT 402*   Cardiac EnzymesNo results for input(s): TROPONINI in the last 168 hours. No results for input(s): TROPIPOC in the last 168 hours.  BNPNo results for input(s): BNP, PROBNP in the last 168 hours.  DDimer No results for input(s): DDIMER in the last 168 hours.  Radiology/Studies:  Dg Ribs Unilateral W/chest Left  Result Date: 12/30/2018 CLINICAL DATA:  Left-sided rib pain fall EXAM: LEFT RIBS AND CHEST - 3+ VIEW COMPARISON:  07/12/2010 FINDINGS: Single-view chest demonstrates no acute airspace disease. There may be small left pleural effusion. Mild cardiomegaly with aortic atherosclerosis. No pneumothorax. Clips in the right axillary region. Left rib series demonstrates old appearing left fifth and 6 rib fractures. Acute mildly displaced left seventh, eighth, ninth  and tenth anterolateral rib fractures. IMPRESSION: 1. Acute displaced left seventh through tenth rib fractures with adjacent small left effusion. 2. Negative for pneumothorax 3. Cardiomegaly Electronically Signed   By: Donavan Foil M.D.   On: 12/30/2018 22:23   Ct Head Wo Contrast  Result Date: 12/31/2018 CLINICAL DATA:  Head trauma, ataxia EXAM: CT HEAD, cervical spine WITHOUT CONTRAST TECHNIQUE: Contiguous axial images were obtained from the base of the skull through the vertex, cervical spine without intravenous contrast. COMPARISON:  December 06, 2017 FINDINGS: Brain: No evidence of acute territorial infarction, hemorrhage, hydrocephalus,extra-axial collection or mass lesion/mass effect. There is dilatation the ventricles and sulci consistent with age-related atrophy. Low-attenuation changes in the deep white matter consistent with small vessel ischemia. Vascular: No hyperdense vessel or unexpected calcification. Skull: The skull is intact. No fracture or focal lesion identified. Sinuses/Orbits: The visualized paranasal sinuses are clear. There is fluid within the right mastoid air cell. The orbits and globes intact. Other: Unchanged small 1 cm in right parotid space nodule again identified. Cervical spine: Alignment: Physiologic Skull base and vertebrae: Visualized skull base is intact. No atlanto-occipital dissociation. The vertebral body heights are well maintained. No fracture or pathologic osseous lesion seen. Soft tissues and spinal canal: The visualized paraspinal soft tissues are unremarkable. No prevertebral soft tissue swelling is seen. The spinal canal is grossly unremarkable, no large epidural collection or significant canal narrowing. Disc levels: Mild disc height loss with disc osteophyte and uncovertebral osteophytes is  most notable at C6-C7. Upper chest: Again noted is a calcified nodule within the right thyroid lobe. There is chronic left-sided pleural thickening with a posterior fourth  healed rib fracture deformity. Other: None IMPRESSION: No acute intracranial abnormality. Findings consistent with age related atrophy and chronic small vessel ischemia Fluid within the right mastoid air cell as on prior exam. No acute fracture or malalignment of the spine. Electronically Signed   By: Prudencio Pair M.D.   On: 12/31/2018 00:42   Ct Chest W Contrast  Result Date: 12/31/2018 CLINICAL DATA:  Fall with left-sided pain. Rib fractures on radiograph. EXAM: CT CHEST, ABDOMEN, AND PELVIS WITH CONTRAST TECHNIQUE: Multidetector CT imaging of the chest, abdomen and pelvis was performed following the standard protocol during bolus administration of intravenous contrast. CONTRAST:  33mL OMNIPAQUE IOHEXOL 300 MG/ML  SOLN COMPARISON:  Rib radiographs yesterday. Chest abdomen pelvis CT 12/06/2017 FINDINGS: CT CHEST FINDINGS Cardiovascular: No acute vascular injury. Aortic atherosclerosis and tortuosity. Normal heart size with coronary artery calcifications. Small pericardial effusion. Mediastinum/Nodes: No mediastinal hemorrhage or hematoma. No pneumomediastinum. Moderate hiatal hernia with fluid-filled esophagus. Peripherally calcified right thyroid nodule. No adenopathy. Lungs/Pleura: Small left pleural effusion and adjacent compressive atelectasis. No pneumothorax. Minor dependent atelectasis in the right lower lobe with trace right pleural effusion. Lungs are otherwise clear. Trachea and mainstem bronchi are patent. Musculoskeletal: Motion artifact partially obscures the known left anterior sixth through ninth rib fractures (reported as 7 through tenth rib fractures on radiograph). No radiographically occult rib fracture. No fracture of the sternum or included clavicles and shoulder girdles. No acute fracture of the lumbar spine. Vertebral body hemangioma within T11. Subcutaneous contusion involving the left lateral chest wall. No subcutaneous emphysema. CT ABDOMEN PELVIS FINDINGS Hepatobiliary: No hepatic  injury or perihepatic hematoma. Gallbladder is surgically absent. Pancreas: No ductal dilatation or inflammation. Parenchymal atrophy without evidence of injury. Spleen: No splenic injury or perisplenic hematoma. Adrenals/Urinary Tract: No adrenal hemorrhage or renal injury identified. Multiple bilateral renal cysts. Absent renal excretion on delayed phase imaging suggest renal dysfunction. Possible solid lesion in the mid left kidney measuring 16 mm, series 3, image 62, not significantly changed from prior. Bladder is unremarkable. Stomach/Bowel: Moderate to large hiatal hernia. No evidence of bowel injury. No mesenteric hematoma. No bowel wall thickening. Distal colonic diverticulosis without diverticulitis. Appendix not confidently visualized. Vascular/Lymphatic: No vascular injury. Aorto bi-iliac atherosclerosis and tortuosity. No retroperitoneal fluid. The IVC is intact. Portal vein is patent. No suspicious adenopathy. Reproductive: Status post hysterectomy. No adnexal masses. Other: No free air or free fluid. Mild patchy contusion in the left flank. Musculoskeletal: Chronic L1 compression fracture with vertebral augmentation. Hemangioma within L3 vertebral body. Multilevel degenerative change in the spine. Bony pelvis is intact. Degenerative change of the pubic symphysis. 1 IMPRESSION: 1. Minimally displaced left anterior sixth through ninth rib fractures. Small left pleural effusion and adjacent compressive atelectasis. No pneumothorax. 2. Subcutaneous contusion of the left lateral chest wall. 3. No additional acute traumatic injury to the chest, abdomen, or pelvis. 4. Moderate to large hiatal hernia with fluid-filled esophagus. Colonic diverticulosis. 5. Absent renal excretion on delayed phase imaging suggests renal dysfunction. Indeterminate lesion in the left mid kidney measuring 16 mm is unchanged from CT 1 year ago. Aortic Atherosclerosis (ICD10-I70.0). Electronically Signed   By: Keith Rake M.D.    On: 12/31/2018 00:37   Ct Cervical Spine Wo Contrast  Result Date: 12/31/2018 CLINICAL DATA:  Head trauma, ataxia EXAM: CT HEAD, cervical spine WITHOUT CONTRAST TECHNIQUE: Contiguous  axial images were obtained from the base of the skull through the vertex, cervical spine without intravenous contrast. COMPARISON:  December 06, 2017 FINDINGS: Brain: No evidence of acute territorial infarction, hemorrhage, hydrocephalus,extra-axial collection or mass lesion/mass effect. There is dilatation the ventricles and sulci consistent with age-related atrophy. Low-attenuation changes in the deep white matter consistent with small vessel ischemia. Vascular: No hyperdense vessel or unexpected calcification. Skull: The skull is intact. No fracture or focal lesion identified. Sinuses/Orbits: The visualized paranasal sinuses are clear. There is fluid within the right mastoid air cell. The orbits and globes intact. Other: Unchanged small 1 cm in right parotid space nodule again identified. Cervical spine: Alignment: Physiologic Skull base and vertebrae: Visualized skull base is intact. No atlanto-occipital dissociation. The vertebral body heights are well maintained. No fracture or pathologic osseous lesion seen. Soft tissues and spinal canal: The visualized paraspinal soft tissues are unremarkable. No prevertebral soft tissue swelling is seen. The spinal canal is grossly unremarkable, no large epidural collection or significant canal narrowing. Disc levels: Mild disc height loss with disc osteophyte and uncovertebral osteophytes is most notable at C6-C7. Upper chest: Again noted is a calcified nodule within the right thyroid lobe. There is chronic left-sided pleural thickening with a posterior fourth healed rib fracture deformity. Other: None IMPRESSION: No acute intracranial abnormality. Findings consistent with age related atrophy and chronic small vessel ischemia Fluid within the right mastoid air cell as on prior exam. No  acute fracture or malalignment of the spine. Electronically Signed   By: Prudencio Pair M.D.   On: 12/31/2018 00:42   Ct Abdomen Pelvis W Contrast  Result Date: 12/31/2018 CLINICAL DATA:  Fall with left-sided pain. Rib fractures on radiograph. EXAM: CT CHEST, ABDOMEN, AND PELVIS WITH CONTRAST TECHNIQUE: Multidetector CT imaging of the chest, abdomen and pelvis was performed following the standard protocol during bolus administration of intravenous contrast. CONTRAST:  32mL OMNIPAQUE IOHEXOL 300 MG/ML  SOLN COMPARISON:  Rib radiographs yesterday. Chest abdomen pelvis CT 12/06/2017 FINDINGS: CT CHEST FINDINGS Cardiovascular: No acute vascular injury. Aortic atherosclerosis and tortuosity. Normal heart size with coronary artery calcifications. Small pericardial effusion. Mediastinum/Nodes: No mediastinal hemorrhage or hematoma. No pneumomediastinum. Moderate hiatal hernia with fluid-filled esophagus. Peripherally calcified right thyroid nodule. No adenopathy. Lungs/Pleura: Small left pleural effusion and adjacent compressive atelectasis. No pneumothorax. Minor dependent atelectasis in the right lower lobe with trace right pleural effusion. Lungs are otherwise clear. Trachea and mainstem bronchi are patent. Musculoskeletal: Motion artifact partially obscures the known left anterior sixth through ninth rib fractures (reported as 7 through tenth rib fractures on radiograph). No radiographically occult rib fracture. No fracture of the sternum or included clavicles and shoulder girdles. No acute fracture of the lumbar spine. Vertebral body hemangioma within T11. Subcutaneous contusion involving the left lateral chest wall. No subcutaneous emphysema. CT ABDOMEN PELVIS FINDINGS Hepatobiliary: No hepatic injury or perihepatic hematoma. Gallbladder is surgically absent. Pancreas: No ductal dilatation or inflammation. Parenchymal atrophy without evidence of injury. Spleen: No splenic injury or perisplenic hematoma.  Adrenals/Urinary Tract: No adrenal hemorrhage or renal injury identified. Multiple bilateral renal cysts. Absent renal excretion on delayed phase imaging suggest renal dysfunction. Possible solid lesion in the mid left kidney measuring 16 mm, series 3, image 62, not significantly changed from prior. Bladder is unremarkable. Stomach/Bowel: Moderate to large hiatal hernia. No evidence of bowel injury. No mesenteric hematoma. No bowel wall thickening. Distal colonic diverticulosis without diverticulitis. Appendix not confidently visualized. Vascular/Lymphatic: No vascular injury. Aorto bi-iliac atherosclerosis and tortuosity. No  retroperitoneal fluid. The IVC is intact. Portal vein is patent. No suspicious adenopathy. Reproductive: Status post hysterectomy. No adnexal masses. Other: No free air or free fluid. Mild patchy contusion in the left flank. Musculoskeletal: Chronic L1 compression fracture with vertebral augmentation. Hemangioma within L3 vertebral body. Multilevel degenerative change in the spine. Bony pelvis is intact. Degenerative change of the pubic symphysis. 1 IMPRESSION: 1. Minimally displaced left anterior sixth through ninth rib fractures. Small left pleural effusion and adjacent compressive atelectasis. No pneumothorax. 2. Subcutaneous contusion of the left lateral chest wall. 3. No additional acute traumatic injury to the chest, abdomen, or pelvis. 4. Moderate to large hiatal hernia with fluid-filled esophagus. Colonic diverticulosis. 5. Absent renal excretion on delayed phase imaging suggests renal dysfunction. Indeterminate lesion in the left mid kidney measuring 16 mm is unchanged from CT 1 year ago. Aortic Atherosclerosis (ICD10-I70.0). Electronically Signed   By: Keith Rake M.D.   On: 12/31/2018 00:37    Assessment and Plan:   1. Atrial Fibrillation No prior hx of arrhythmia. On verapamil at home due to HTN and OSA. Mentions poor medication adherence due to weakness. Likely  exacerbated by acute illness (hypoglycemia, anemia). Hypothyroidism likely contributing as she mentions missing doses. Current HR ~100 not in RVR. Vitals stable. CHADS-VASC score of 5 (age, F, HTN, DM) Not anticoagulation candidate due to frequent falls and anemia.  - Resume home verapamil  - Check TSH - Correct underlying issues (hypoglycemia, anemia) - F/u Echocardiogram - Check/replete K/Mag  2. Chronic diastolic heart failure Hx of prior echo w/ grade 1 diastolic dysfunction, EF [id number]. Old med list shows furosemide. Appear mildly hypervolemic on exam. - F/u Echo - Daily weights, I&O - No need for diuresis at this time  3. Symptomatic Microcytic Anemia On admission found to have hgb of 5.2. No evidence of bleed on exam. Hx of poor oral intake and weight loss per pt. FOBT negative. S/p 2prbc. Iron studies ordered - F/u post-transfusion H&H - F/u iron studies - Transfuse if hgb <7  4. Recent fall w/ rib fractures History of worsening ability to care for herself at home. Incidental finding of rib fractures. Trauma consulted but no surgical intervention indicated. - PT/OT - Social work consult - Will likely need to be discharged to SNF  5. Hypoglycemia / Insulin dependent Diabetes Mellitus Mentions administrating insulin herself at home. Also mentions poor oral intake due to weakness and loss of appetite. On admission glucose 44. Improved to 152 overnight. - Glucose checks - Need med rec to find optimal insulin dose prior to discharge - Check hgb a1c  For questions or updates, please contact [place] Please consult [email or web address] for contact info under Cardiology/STEMI.   Signed, Gilberto Better, MD PGY-2, [facility] Pager: [phone] 12/31/2018 11:47 AM  Attending attestation to follow

## 2018-12-31 NOTE — ED Notes (Signed)
Lunch Tray Ordered @ 1143. 

## 2018-12-31 NOTE — ED Notes (Signed)
Patient CBG was 81. I tried to get patient blood in 2 different area, I wasn't able to get any.

## 2018-12-31 NOTE — ED Notes (Signed)
Ordered a hospital bed per RN Kelly--Aisley Whan  

## 2019-01-01 ENCOUNTER — Encounter (HOSPITAL_COMMUNITY): Payer: Self-pay | Admitting: Emergency Medicine

## 2019-01-01 ENCOUNTER — Inpatient Hospital Stay (HOSPITAL_COMMUNITY): Payer: Medicare Other

## 2019-01-01 ENCOUNTER — Encounter (HOSPITAL_COMMUNITY)
Admission: EM | Disposition: A | Discharge status: Skilled Nursing Facility | Payer: Self-pay | Source: Home / Self Care | Attending: Family Medicine

## 2019-01-01 DIAGNOSIS — I472 Ventricular tachycardia: Secondary | ICD-10-CM | POA: Diagnosis not present

## 2019-01-01 DIAGNOSIS — D62 Acute posthemorrhagic anemia: Secondary | ICD-10-CM | POA: Diagnosis not present

## 2019-01-01 DIAGNOSIS — K449 Diaphragmatic hernia without obstruction or gangrene: Secondary | ICD-10-CM

## 2019-01-01 DIAGNOSIS — D509 Iron deficiency anemia, unspecified: Secondary | ICD-10-CM

## 2019-01-01 DIAGNOSIS — R4182 Altered mental status, unspecified: Secondary | ICD-10-CM | POA: Diagnosis not present

## 2019-01-01 DIAGNOSIS — K2101 Gastro-esophageal reflux disease with esophagitis, with bleeding: Secondary | ICD-10-CM | POA: Diagnosis present

## 2019-01-01 DIAGNOSIS — E162 Hypoglycemia, unspecified: Secondary | ICD-10-CM | POA: Diagnosis present

## 2019-01-01 DIAGNOSIS — W010XXA Fall on same level from slipping, tripping and stumbling without subsequent striking against object, initial encounter: Secondary | ICD-10-CM | POA: Diagnosis present

## 2019-01-01 DIAGNOSIS — Z853 Personal history of malignant neoplasm of breast: Secondary | ICD-10-CM | POA: Diagnosis not present

## 2019-01-01 DIAGNOSIS — E43 Unspecified severe protein-calorie malnutrition: Secondary | ICD-10-CM | POA: Diagnosis present

## 2019-01-01 DIAGNOSIS — E039 Hypothyroidism, unspecified: Secondary | ICD-10-CM | POA: Diagnosis present

## 2019-01-01 DIAGNOSIS — R4701 Aphasia: Secondary | ICD-10-CM | POA: Diagnosis not present

## 2019-01-01 DIAGNOSIS — E1165 Type 2 diabetes mellitus with hyperglycemia: Secondary | ICD-10-CM | POA: Diagnosis present

## 2019-01-01 DIAGNOSIS — Y92009 Unspecified place in unspecified non-institutional (private) residence as the place of occurrence of the external cause: Secondary | ICD-10-CM | POA: Diagnosis not present

## 2019-01-01 DIAGNOSIS — K2100 Gastro-esophageal reflux disease with esophagitis, without bleeding: Secondary | ICD-10-CM | POA: Diagnosis not present

## 2019-01-01 DIAGNOSIS — D649 Anemia, unspecified: Secondary | ICD-10-CM | POA: Diagnosis present

## 2019-01-01 DIAGNOSIS — R471 Dysarthria and anarthria: Secondary | ICD-10-CM | POA: Diagnosis not present

## 2019-01-01 DIAGNOSIS — I4819 Other persistent atrial fibrillation: Secondary | ICD-10-CM | POA: Diagnosis present

## 2019-01-01 DIAGNOSIS — Z6830 Body mass index (BMI) 30.0-30.9, adult: Secondary | ICD-10-CM | POA: Diagnosis not present

## 2019-01-01 DIAGNOSIS — R410 Disorientation, unspecified: Secondary | ICD-10-CM | POA: Diagnosis not present

## 2019-01-01 DIAGNOSIS — B961 Klebsiella pneumoniae [K. pneumoniae] as the cause of diseases classified elsewhere: Secondary | ICD-10-CM | POA: Diagnosis present

## 2019-01-01 DIAGNOSIS — I10 Essential (primary) hypertension: Secondary | ICD-10-CM | POA: Diagnosis not present

## 2019-01-01 DIAGNOSIS — S2249XA Multiple fractures of ribs, unspecified side, initial encounter for closed fracture: Secondary | ICD-10-CM | POA: Diagnosis not present

## 2019-01-01 DIAGNOSIS — I639 Cerebral infarction, unspecified: Secondary | ICD-10-CM | POA: Diagnosis not present

## 2019-01-01 DIAGNOSIS — Z9989 Dependence on other enabling machines and devices: Secondary | ICD-10-CM | POA: Diagnosis not present

## 2019-01-01 DIAGNOSIS — E119 Type 2 diabetes mellitus without complications: Secondary | ICD-10-CM | POA: Diagnosis not present

## 2019-01-01 DIAGNOSIS — N390 Urinary tract infection, site not specified: Secondary | ICD-10-CM | POA: Diagnosis present

## 2019-01-01 DIAGNOSIS — N289 Disorder of kidney and ureter, unspecified: Secondary | ICD-10-CM | POA: Diagnosis present

## 2019-01-01 DIAGNOSIS — Z20828 Contact with and (suspected) exposure to other viral communicable diseases: Secondary | ICD-10-CM | POA: Diagnosis present

## 2019-01-01 DIAGNOSIS — G4733 Obstructive sleep apnea (adult) (pediatric): Secondary | ICD-10-CM | POA: Diagnosis present

## 2019-01-01 DIAGNOSIS — E871 Hypo-osmolality and hyponatremia: Secondary | ICD-10-CM | POA: Diagnosis present

## 2019-01-01 DIAGNOSIS — I480 Paroxysmal atrial fibrillation: Secondary | ICD-10-CM | POA: Diagnosis present

## 2019-01-01 DIAGNOSIS — S2242XA Multiple fractures of ribs, left side, initial encounter for closed fracture: Secondary | ICD-10-CM | POA: Diagnosis present

## 2019-01-01 DIAGNOSIS — N183 Chronic kidney disease, stage 3 unspecified: Secondary | ICD-10-CM | POA: Diagnosis present

## 2019-01-01 DIAGNOSIS — Z993 Dependence on wheelchair: Secondary | ICD-10-CM | POA: Diagnosis not present

## 2019-01-01 DIAGNOSIS — G9341 Metabolic encephalopathy: Secondary | ICD-10-CM | POA: Diagnosis not present

## 2019-01-01 DIAGNOSIS — E1122 Type 2 diabetes mellitus with diabetic chronic kidney disease: Secondary | ICD-10-CM | POA: Diagnosis present

## 2019-01-01 HISTORY — PX: ESOPHAGOGASTRODUODENOSCOPY: SHX5428

## 2019-01-01 HISTORY — PX: BIOPSY: SHX5522

## 2019-01-01 LAB — COMPREHENSIVE METABOLIC PANEL
ALT: 16 U/L (ref 0–44)
AST: 21 U/L (ref 15–41)
Albumin: 2.3 g/dL — ABNORMAL LOW (ref 3.5–5.0)
Alkaline Phosphatase: 63 U/L (ref 38–126)
Anion gap: 10 (ref 5–15)
BUN: 19 mg/dL (ref 8–23)
CO2: 19 mmol/L — ABNORMAL LOW (ref 22–32)
Calcium: 7.8 mg/dL — ABNORMAL LOW (ref 8.9–10.3)
Chloride: 102 mmol/L (ref 98–111)
Creatinine, Ser: 1.32 mg/dL — ABNORMAL HIGH (ref 0.44–1.00)
GFR calc Af Amer: 43 mL/min — ABNORMAL LOW (ref >60)
GFR calc non Af Amer: 37 mL/min — ABNORMAL LOW (ref >60)
Glucose, Bld: 230 mg/dL — ABNORMAL HIGH (ref 70–99)
Potassium: 4.2 mmol/L (ref 3.5–5.1)
Sodium: 131 mmol/L — ABNORMAL LOW (ref 135–145)
Total Bilirubin: 1.2 mg/dL (ref 0.3–1.2)
Total Protein: 5 g/dL — ABNORMAL LOW (ref 6.5–8.1)

## 2019-01-01 LAB — GLUCOSE, CAPILLARY
Glucose-Capillary: 165 mg/dL — ABNORMAL HIGH (ref 70–99)
Glucose-Capillary: 183 mg/dL — ABNORMAL HIGH (ref 70–99)
Glucose-Capillary: 212 mg/dL — ABNORMAL HIGH (ref 70–99)
Glucose-Capillary: 214 mg/dL — ABNORMAL HIGH (ref 70–99)
Glucose-Capillary: 266 mg/dL — ABNORMAL HIGH (ref 70–99)

## 2019-01-01 LAB — TYPE AND SCREEN
ABO/RH(D): A POS
Antibody Screen: NEGATIVE
Unit division: 0
Unit division: 0

## 2019-01-01 LAB — BPAM RBC
Blood Product Expiration Date: 202011112359
Blood Product Expiration Date: 202011112359
ISSUE DATE / TIME: 202010230040
ISSUE DATE / TIME: 202010230427
Unit Type and Rh: 6200
Unit Type and Rh: 6200

## 2019-01-01 LAB — CBC
HCT: 26.8 % — ABNORMAL LOW (ref 36.0–46.0)
Hemoglobin: 8.1 g/dL — ABNORMAL LOW (ref 12.0–15.0)
MCH: 20.8 pg — ABNORMAL LOW (ref 26.0–34.0)
MCHC: 30.2 g/dL (ref 30.0–36.0)
MCV: 68.9 fL — ABNORMAL LOW (ref 80.0–100.0)
Platelets: 321 10*3/uL (ref 150–400)
RBC: 3.89 MIL/uL (ref 3.87–5.11)
RDW: 25.8 % — ABNORMAL HIGH (ref 11.5–15.5)
WBC: 7.7 10*3/uL (ref 4.0–10.5)
nRBC: 0.9 % — ABNORMAL HIGH (ref 0.0–0.2)

## 2019-01-01 LAB — MAGNESIUM: Magnesium: 1.8 mg/dL (ref 1.7–2.4)

## 2019-01-01 SURGERY — EGD (ESOPHAGOGASTRODUODENOSCOPY)
Anesthesia: Moderate Sedation

## 2019-01-01 MED ORDER — ENSURE ENLIVE PO LIQD
237.0000 mL | Freq: Two times a day (BID) | ORAL | Status: DC
  Administered 2019-01-03: 237 mL via ORAL

## 2019-01-01 MED ORDER — ALUM & MAG HYDROXIDE-SIMETH 200-200-20 MG/5ML PO SUSP
30.0000 mL | ORAL | Status: DC | PRN
  Administered 2019-01-01: 30 mL via ORAL
  Filled 2019-01-01: qty 30

## 2019-01-01 MED ORDER — ADULT MULTIVITAMIN W/MINERALS CH
1.0000 | ORAL_TABLET | Freq: Every day | ORAL | Status: DC
  Administered 2019-01-01 – 2019-01-07 (×7): 1 via ORAL
  Filled 2019-01-01 (×7): qty 1

## 2019-01-01 MED ORDER — SODIUM CHLORIDE 0.9 % IV SOLN
510.0000 mg | Freq: Once | INTRAVENOUS | Status: AC
  Administered 2019-01-01: 510 mg via INTRAVENOUS
  Filled 2019-01-01: qty 17

## 2019-01-01 MED ORDER — INSULIN GLARGINE 100 UNIT/ML ~~LOC~~ SOLN
10.0000 [IU] | Freq: Every day | SUBCUTANEOUS | Status: DC
  Administered 2019-01-01: 10 [IU] via SUBCUTANEOUS
  Filled 2019-01-01 (×2): qty 0.1

## 2019-01-01 MED ORDER — INSULIN ASPART 100 UNIT/ML ~~LOC~~ SOLN
0.0000 [IU] | Freq: Three times a day (TID) | SUBCUTANEOUS | Status: DC
  Administered 2019-01-01 – 2019-01-03 (×2): 2 [IU] via SUBCUTANEOUS
  Administered 2019-01-03: 3 [IU] via SUBCUTANEOUS
  Administered 2019-01-04: 1 [IU] via SUBCUTANEOUS
  Administered 2019-01-04 (×2): 2 [IU] via SUBCUTANEOUS
  Administered 2019-01-05 (×3): 3 [IU] via SUBCUTANEOUS
  Administered 2019-01-06 (×2): 2 [IU] via SUBCUTANEOUS
  Administered 2019-01-06: 3 [IU] via SUBCUTANEOUS
  Administered 2019-01-07 (×2): 2 [IU] via SUBCUTANEOUS
  Administered 2019-01-07: 3 [IU] via SUBCUTANEOUS

## 2019-01-01 MED ORDER — MIDAZOLAM HCL (PF) 5 MG/ML IJ SOLN
INTRAMUSCULAR | Status: AC
  Filled 2019-01-01: qty 2

## 2019-01-01 MED ORDER — FENTANYL CITRATE (PF) 100 MCG/2ML IJ SOLN
INTRAMUSCULAR | Status: AC
  Filled 2019-01-01: qty 2

## 2019-01-01 MED ORDER — FENTANYL CITRATE (PF) 100 MCG/2ML IJ SOLN
INTRAMUSCULAR | Status: DC | PRN
  Administered 2019-01-01 (×2): 25 ug via INTRAVENOUS

## 2019-01-01 MED ORDER — INSULIN ASPART 100 UNIT/ML ~~LOC~~ SOLN
0.0000 [IU] | Freq: Every day | SUBCUTANEOUS | Status: DC
  Administered 2019-01-01 – 2019-01-06 (×4): 2 [IU] via SUBCUTANEOUS

## 2019-01-01 MED ORDER — MIDAZOLAM HCL (PF) 10 MG/2ML IJ SOLN
INTRAMUSCULAR | Status: DC | PRN
  Administered 2019-01-01 (×2): 2 mg via INTRAVENOUS

## 2019-01-01 MED ORDER — PANTOPRAZOLE SODIUM 40 MG PO TBEC
40.0000 mg | DELAYED_RELEASE_TABLET | Freq: Two times a day (BID) | ORAL | Status: DC
  Administered 2019-01-01 – 2019-01-07 (×13): 40 mg via ORAL
  Filled 2019-01-01 (×14): qty 1

## 2019-01-01 NOTE — Progress Notes (Signed)
Mrs. Hoffart was transferred to Endo via bed at 0735.  Received from Endo at [place].  Pt is awake to voice,; responds appropriately.  Denies pain at this time.  Placed on cardiac monitor.

## 2019-01-01 NOTE — Interval H&P Note (Signed)
History and Physical Interval Note:  01/01/2019 8:18 AM  Diana [name]  has presented today for surgery, with the diagnosis of severe anemia.  The various methods of treatment have been discussed with the patient and family. After consideration of risks, benefits and other options for treatment, the patient has consented to  Procedure(s): ESOPHAGOGASTRODUODENOSCOPY (EGD) (N/A) as a surgical intervention.  The patient's history has been reviewed, patient examined, no change in status, stable for surgery.  I have reviewed the patient's chart and labs.  Questions were answered to the patient's satisfaction.     Milus Banister

## 2019-01-01 NOTE — Op Note (Signed)
[facility] Patient Name: Diana [name] Procedure Date : 01/01/2019 MRN: [id number] Attending MD: Milus Banister , MD Date of Birth: 1933-09-24 CSN: [id number] Age: 85 Admit Type: Inpatient Procedure:                Upper GI endoscopy Indications:              Severe microcytic anemia (her ferritin and iron                            were normal but that was AFTER blood transfusion) Providers:                Milus Banister, MD, Ashley Khloie Hamada, RN, Laverda Sorenson, Technician Referring MD:              Medicines:                Fentanyl 50 micrograms IV, Midazolam 4 mg IV Complications:            No immediate complications. Estimated blood loss:                            None. Estimated Blood Loss:     Estimated blood loss: none. Procedure:                Pre-Anesthesia Assessment:                           - Prior to the procedure, a History and Physical                            was performed, and patient medications and                            allergies were reviewed. The patient's tolerance of                            previous anesthesia was also reviewed. The risks                            and benefits of the procedure and the sedation                            options and risks were discussed with the patient.                            All questions were answered, and informed consent                            was obtained. Prior Anticoagulants: The patient has                            taken no previous anticoagulant or antiplatelet  agents. ASA Grade Assessment: IV - A patient with                            severe systemic disease that is a constant threat                            to life. After reviewing the risks and benefits,                            the patient was deemed in satisfactory condition to                            undergo the procedure.                           After  obtaining informed consent, the endoscope was                            passed under direct vision. Throughout the                            procedure, the patient's blood pressure, pulse, and                            oxygen saturations were monitored continuously. The                            [id number]) Olympus gastroscope was                            introduced through the mouth, and advanced to the                            second part of duodenum. The upper GI endoscopy was                            accomplished without difficulty. The patient                            tolerated the procedure well. Scope In: Scope Out: Findings:      Severe, ulcerative esophagitis (LA Grade D) with several long linear,       hematin stained edematous ulcers in the mid to lower third of the       esophagus.      Focal, incomplete, peptic appearing GE junction stricture. Lumen       14-86mm. I biospied this to exclude neoplasm which I think is unlikely.      Large hiatal hernia with obvious Cameron's type erosions.      The exam was otherwise without abnormality. Impression:               Severe, ulcerative esophagitis (LA Grade D) with                            several long linear, hematin stained edematous  ulcers in the mid to lower third of the esophagus.                           Focal incomplete, peptic appearing GE junction                            stricture. Lumen 14-3mm. I biospied this to                            exclude neoplasm which I think is unlikely.                           Large hiatal hernia with obvious Cameron's type                            erosions.                           These findings likely explain her severe anemia, no                            further GI testing is recommended at this point. Recommendation:           - Return patient to hospital ward for ongoing care.                            I will advance her  diet.                           - She will need PPI twice daily, probably                            indefinitely (I will order this)                           - Fereheme infusion while in hospital and then she                            should take a single OTC iron supplement once daily                            indefinitely.                           - My office will contact her to arrange repeat CBC                            in 1 month, office visit with me in 6-8 weeks. Procedure Code(s):        --- Professional ---                           [phone], Esophagogastroduodenoscopy, flexible,                            transoral;  with biopsy, single or multiple Diagnosis Code(s):        --- Professional ---                           K21.0, Gastro-esophageal reflux disease with                            esophagitis                           D50.9, Iron deficiency anemia, unspecified CPT copyright 2019 American Medical Association. All rights reserved. The codes documented in this report are preliminary and upon coder review may  be revised to meet current compliance requirements. Milus Banister, MD 01/01/2019 8:47:50 AM This report has been signed electronically. Number of Addenda: 0

## 2019-01-01 NOTE — Progress Notes (Signed)
Limited evaluation as pt lethargic from earlier EGD. Pt transfers to a her w/c independently and uses her w/c as primary means of mobility. She is assisted for bathing and dressing and IADL, but can toilet, groom and self feed at baseline. Pt presents with generalized weakness and slow processing. Will follow acutely, recommending SNF for further rehab.   01/01/19 1400  OT Visit Information  Last OT Received On 01/01/19  Assistance Needed +2 (for OOB)  History of Present Illness 85 y.o. female admitted on 12/30/18 for fall with symptomatic anemia s/p 2 units PRBCs, hypoglycemia, A-fib, and rib fractures L anterior 6-9.  Pt with other significant PMH of DM2, renal insufficiency, obesity, HTN, breast CA, anemia (due to poor B12 absorption), back surgery, bowel resection, bil mastectomy.    Precautions  Precautions Fall  Precaution Comments 2 falls in the past week trying to get to her [facility]  Restrictions  Weight Bearing Restrictions No  Home Living  Family/patient expects to be discharged to: Private residence  Living Arrangements Alone  Available Help at Discharge Personal care attendant;Available PRN/intermittently  Type of [place] entrance  Home Layout Two level;Able to live on main level with bedroom/bathroom  Animal nutritionist - 2 wheels;BSC;Shower seat;Wheelchair - manual  Prior Function  Level of Independence Needs assistance  Gait / Transfers Assistance Needed Pt does most of her mobility from bed to WC, WC to bathroom 2-3 steps to the toilet holding the counters and back to WC.  Per her caregiver she was able to walk further in January 2020 when she had home therapy just after coming home from rehab.   ADL's / Homemaking Assistance Needed Pt is generally able to toilet herself.  She needs assist bathing and dressing.   Communication  Communication No difficulties (slightly slurred)  Pain  Assessment  Faces Pain Scale 4  Pain Location L ribs  Pain Descriptors / Indicators Grimacing;Guarding  Pain Intervention(s) Monitored during session  Cognition  Arousal/Alertness Awake/alert  Behavior During Therapy WFL for tasks assessed/performed  Overall Cognitive Status Impaired/Different from baseline  Area of Impairment Following commands;Problem solving;Orientation  Orientation Level Disoriented to;Situation;Time  Following Commands Follows one step commands with increased time  Problem Solving Slow processing;Decreased initiation;Difficulty sequencing;Requires verbal cues;Requires tactile cues  General Comments pt awakened for therapy, had sedation earlier today for EGD  Upper Extremity Assessment  Upper Extremity Assessment Generalized weakness  Lower Extremity Assessment  Lower Extremity Assessment Defer to PT evaluation  Cervical / Trunk Assessment  Cervical / Trunk Assessment Kyphotic  ADL  Overall ADL's  Needs assistance/impaired  Eating/Feeding Minimal assistance;Bed level;Sitting  Grooming Bed level;Wash/dry hands;Set up  Upper Body Dressing  Minimal assistance;Bed level  General ADL Comments Pt is assisted for bathing and dressing at baseline, functions from a w/c level. Pt has had a caregiver for 9 years.  Vision- History  Patient Visual Report No change from baseline  Bed Mobility  Overal bed mobility Needs Assistance  Bed Mobility Supine to Sit;Sit to Supine  Supine to sit Min assist  Sit to supine Mod assist  General bed mobility comments assist to elevate trunk and for LEs back into bed  Transfers  General transfer comment NT, pt groggy  Balance  Overall balance assessment Needs assistance  Sitting balance-Leahy Scale Fair  OT - End of Session  Activity Tolerance Patient limited by fatigue  Patient left in bed;with call bell/phone within reach;with bed alarm  set;with family/visitor present  OT Assessment  OT Recommendation/Assessment Patient needs  continued OT Services  OT Visit Diagnosis Unsteadiness on feet (R26.81);Other abnormalities of gait and mobility (R26.89);Muscle weakness (generalized) (M62.81);Other symptoms and signs involving cognitive function;Pain;History of falling (Z91.81)  OT Problem List Decreased strength;Decreased activity tolerance;Impaired balance (sitting and/or standing);Decreased knowledge of use of DME or AE;Pain;Decreased cognition  OT Plan  OT Frequency (ACUTE ONLY) Min 2X/week  OT Treatment/Interventions (ACUTE ONLY) Self-care/ADL training;DME and/or AE instruction;Cognitive remediation/compensation;Patient/family education;Balance training;Therapeutic activities  AM-PAC OT "6 Clicks" Daily Activity Outcome Measure (Version 2)  Help from another person eating meals? 3  Help from another person taking care of personal grooming? 3  Help from another person toileting, which includes using toliet, bedpan, or urinal? 1  Help from another person bathing (including washing, rinsing, drying)? 2  Help from another person to put on and taking off regular upper body clothing? 3  Help from another person to put on and taking off regular lower body clothing? 1  6 Click Score 13  OT Recommendation  Follow Up Recommendations SNF  Individuals Consulted  Consulted and Agree with Results and Recommendations Patient  Acute Rehab OT Goals  Patient Stated Goal to feel better, get back home  OT Goal Formulation With patient  Time For Goal Achievement 01/15/19  Potential to Achieve Goals Good  OT Time Calculation  OT Start Time (ACUTE ONLY) 1434  OT Stop Time (ACUTE ONLY) 1456  OT Time Calculation (min) 22 min  OT General Charges  $OT Visit 1 Visit  OT Evaluation  $OT Eval Moderate Complexity 1 Mod  Written Expression  Dominant Hand Right  Nestor Lewandowsky, OTR/L Acute Rehabilitation Services Pager: [phone] Office: [phone]

## 2019-01-01 NOTE — Final Consult Note (Signed)
Consultant Final Sign-Off Note    Assessment/Final recommendations  Diana [name] is a 85 y.o. female followed by me for rib fractures. Patient currently having no pain issues, breathing well on room air, denies SOB. Patient worked with physical therapy who recommends SNF placement.    Wound care (if applicable): N/A   Diet at discharge: per primary team   Activity at discharge: per primary team   Follow-up appointment:  PCP, TBD   Pending results:  Unresulted Labs (From admission, onward)    Start     Ordered   12/30/18 2119  Urinalysis, Routine w reflex microscopic  ONCE - STAT,   STAT     12/30/18 2119           Medication recommendations: tylenol q6h prn for mild pain, could add robaxin 500 mg q8h prn for more moderate to severe pain   Other recommendations: Continue pulmonary toilet and IS    Thank you for allowing [place] to participate in the care of your patient!  Please consult [place] again if you have further needs for your patient.  Claiborne Billings [place] 01/01/2019 7:27 AM    Subjective   Patient denies SOB or chest pain. Not requiring any supplemental O2 this AM. Discussed getting IS with RN who reports that the floor is out of them but they will get her one when materials brings more up.   Objective  Vital signs in last 24 hours: Temp:  [97.5 F (36.4 C)-97.7 F (36.5 C)] 97.5 F (36.4 C) (10/23 2020) Pulse Rate:  [48-130] 105 (10/23 2020) Resp:  [12-26] 21 (10/23 2020) BP: (133-172)/(81-114) 161/81 (10/23 2020) SpO2:  [95 %-99 %] 99 % (10/23 2020)  Gen:  Alert, NAD, pleasant Card: RRR Pulm: CTA b/l, normal rate and effort. Minimal left sided ttp Abd: Soft, NT/ND, +BS Ext:  Moves all extremities Skin: no rashes noted, warm and dry  Pertinent labs and Studies: Recent Labs    12/30/18 2246 12/31/18 1844 01/01/19 0319  WBC 6.7 8.8 7.7  HGB 5.2* 8.8* 8.1*  HCT 20.0* 30.4* 26.8*   BMET Recent Labs    12/31/18 1844 01/01/19 0319  NA 133* 131*   K 3.8 4.2  CL 100 102  CO2 22 19*  GLUCOSE 209* 230*  BUN 19 19  CREATININE 1.43* 1.32*  CALCIUM 7.9* 7.8*   No results for input(s): LABURIN in the last 72 hours. Results for orders placed or performed during the hospital encounter of 12/30/18  SARS CORONAVIRUS 2 (TAT 6-24 HRS) Nasopharyngeal Nasopharyngeal Swab     Status: None   Collection Time: 12/30/18 11:32 PM   Specimen: Nasopharyngeal Swab  Result Value Ref Range Status   SARS Coronavirus 2 NEGATIVE NEGATIVE Final    Comment: (NOTE) SARS-CoV-2 target nucleic acids are NOT DETECTED. The SARS-CoV-2 RNA is generally detectable in upper and lower respiratory specimens during the acute phase of infection. Negative results do not preclude SARS-CoV-2 infection, do not rule out co-infections with other pathogens, and should not be used as the sole basis for treatment or other patient management decisions. Negative results must be combined with clinical observations, patient history, and epidemiological information. The expected result is Negative. Fact Sheet for Patients: SugarRoll.be Fact Sheet for Healthcare Providers: https://www.woods-mathews.com/ This test is not yet approved or cleared by the [place] FDA and  has been authorized for detection and/or diagnosis of SARS-CoV-2 by FDA under an Emergency Use Authorization (EUA). This EUA will remain  in effect (meaning this test can  be used) for the duration of the COVID-19 declaration under Section 56 4(b)(1) of the Act, 21 U.S.C. section 360bbb-3(b)(1), unless the authorization is terminated or revoked sooner. Performed at [facility], [place] [address], [place], [place] [address]     Imaging: No results found.

## 2019-01-01 NOTE — Progress Notes (Signed)
To MRI via bed

## 2019-01-01 NOTE — Progress Notes (Signed)
Received patient from MRI.  Placed on cardiac monitor.

## 2019-01-01 NOTE — Evaluation (Signed)
Clinical/Bedside Swallow Evaluation Patient Details  Name: Diana [name] MRN: [id number] Date of Birth: May 18, 1933  Today's Date: 01/01/2019 Time: SLP Start Time (ACUTE ONLY): 67 SLP Stop Time (ACUTE ONLY): 1632 SLP Time Calculation (min) (ACUTE ONLY): 17 min  Past Medical History:  Past Medical History:  Diagnosis Date  . Anemia   . Anemia of decreased vitamin B12 absorption 05/26/2005  . Arthritis   . Breast cancer ([place]) 2001  . Cataract   . History of tobacco abuse   . Hx of colonic polyps   . Hyperlipidemia   . Hypertension   . Hypothyroidism   . Obesity   . OSA on CPAP 09/2007   AHI 10.6/hr overall, 43.64/hr during REM  . Osteopenia   . Renal insufficiency   . Type 2 diabetes mellitus ([place])    Past Surgical History:  Past Surgical History:  Procedure Laterality Date  . BACK SURGERY  05/30/2009  . BOWEL RESECTION  1974  . CATARACT EXTRACTION Bilateral    bilateral  . CHOLECYSTECTOMY  1974  . MASTECTOMY Bilateral 2001   bilateral sentinel lymph nodes bio  . NM MYOCAR PERF WALL MOTION  06/2007   dipyridamole; perfusion defect in inferior myocardium consistent with diaphragmatic attenuation, remaining myocardium with no ischemia/infarct, EF 73%; normal, low risk scan   . TOTAL ABDOMINAL HYSTERECTOMY  1975  . TRANSTHORACIC ECHOCARDIOGRAM  02/2010   123456, stage 1 diastolic dysfunction; borderline RV enlargement; LA mild-mod dilated; mild mitral annular calcif & mild MR; mild TR with normal RSVP, AV moderately sclerotics   HPI:  85 y.o. female admitted on 12/30/18 for fall with symptomatic anemia s/p 2 units PRBCs, hypoglycemia, A-fib, and rib fractures L anterior 6-9.  Pt with other significant PMH of DM2, renal insufficiency, obesity, HTN, breast CA, anemia (due to poor B12 absorption), back surgery, bowel resection, bil mastectomy.     Assessment / Plan / Recommendation Clinical Impression  Pt was encountered lethargic but awake, sitting upright in bed with  caregiver present at bedside.  Pt presents with minimal dysphagia c/b prolonged mastication and intermittent s/sx of aspiration.  Pt was observed with trials of thin liquid, puree, and regular solids.  Pt exhibited eructation x3 following thin liquid trials followed by a delayed throat clear.  Suspect an esophageal etiology vs aspiration; however, pt may be at an increased risk for post prandial aspiration.   No additional s/sx of aspiration were observed with any trials. Pt exhibited mildly prolonged, but effective mastication of regular solids with no oral residue observed.  Recommend continuation of regular solids and thin liquid with the following compensatory strategies: 1) Small bites/sips 2) Slow rate of intake 3) Sit upright 90 degrees 4) Remain upright for 20-30 minutes following po intake.    Cognitive-linguistic screen revealed delayed verbal response, reduced speech intelligibility, and anomia in conversational speech.  Pt and caregiver reported acute changes in speech beginning approximately 1 week ago.  Recommend ordering a comprehensive cognitive-linguistic evaluation if it aligns with MD plan.    SLP Visit Diagnosis: Dysphagia, unspecified (R13.10)    Aspiration Risk  Mild aspiration risk    Diet Recommendation Thin liquid;Regular   Liquid Administration via: Cup;Straw Medication Administration: Whole meds with liquid Supervision: Staff to assist with self feeding Compensations: Minimize environmental distractions;Slow rate;Small sips/bites Postural Changes: Seated upright at 90 degrees;Remain upright for at least 30 minutes after po intake    Other  Recommendations Recommended Consults: Consider GI evaluation Oral Care Recommendations: Oral care BID   Follow  up Recommendations (TBD)      Frequency and Duration min 2x/week  2 weeks       Prognosis        Swallow Study   General Date of Onset: 01/01/19 HPI: 85 y.o. female admitted on 12/30/18 for fall with symptomatic  anemia s/p 2 units PRBCs, hypoglycemia, A-fib, and rib fractures L anterior 6-9.  Pt with other significant PMH of DM2, renal insufficiency, obesity, HTN, breast CA, anemia (due to poor B12 absorption), back surgery, bowel resection, bil mastectomy.   Type of Study: Bedside Swallow Evaluation Previous Swallow Assessment: none Diet Prior to this Study: Regular;Thin liquids Temperature Spikes Noted: No Respiratory Status: Room air History of Recent Intubation: No Behavior/Cognition: Cooperative;Pleasant mood;Requires cueing Oral Cavity Assessment: Within Functional Limits Oral Care Completed by SLP: No Oral Cavity - Dentition: Adequate natural dentition Vision: Functional for self-feeding Self-Feeding Abilities: Needs assist Patient Positioning: Upright in bed Baseline Vocal Quality: Normal Volitional Swallow: Able to elicit    Oral/Motor/Sensory Function Overall Oral Motor/Sensory Function: Within functional limits   Ice Chips Ice chips: Not tested   Thin Liquid Thin Liquid: Impaired Presentation: Cup;Straw;Spoon Pharyngeal  Phase Impairments: Throat Clearing - Delayed    Nectar Thick Nectar Thick Liquid: Not tested   Honey Thick Honey Thick Liquid: Not tested   Puree Puree: Within functional limits Presentation: Spoon   Solid     Solid: Impaired Presentation: Spoon Oral Phase Impairments: Impaired mastication Oral Phase Functional Implications: Impaired mastication;Prolonged oral transit      Diana [name] Diana [name] 01/01/2019,4:50 PM

## 2019-01-01 NOTE — Consult Note (Addendum)
NEURO HOSPITALIST CONSULT NOTE   Requesting physician: Dr. Florene Glen  Reason for Consult: Word-finding difficulty  History obtained from:  Chart and Patient's Housekeeper  HPI:                                                                                                                                          Diana [name] is an 85 y.o. female with PMH DM2, OSA ( cpap), HTN, HLD, hypothyroidism, breast cancer ( 2001) who presented to [facility] ED s/p fall 12/30/2018. Neurology consulted for possible stroke/ word finding difficulty.  Per patient and housekeeper: patient fell Sunday night and called her neighbor. They helped get her up off the floor and back into the wheelchair. When  housekeeper came on Monday she stated that patient was completely normal. Not slurring her speech, not having memory issues. Diana [name] fell again Tuesday and this time EMS came and got her off the floor. When housekeeper arrived Wednesday morning. She stated that Diana [name] was not slurring her speech, she was having pain and did not want to get out of bed. At baseline [name] uses a wheelchair to get around the house. She does not drive but does take care of herself, managing her medications and finances. Friday about 3 pm when the housekeeper came back to hospital to see Diana [name], she noticed that her speech had become slurred and her speech was slower than usual.  Hospital course: 12/30/2018: admitted, BG: 44 ( given D50),  10/23: CTH: no hemorrhage. Hgb: 5.2 ( 2 units RBC given) b12: 1040 TSH: WNL Na: 133 10/24: MRI: negative for stroke; MRA: no LVO, neurology consulted  NIHSS: 1; dysarthria Past Medical History:  Diagnosis Date  . Anemia   . Anemia of decreased vitamin B12 absorption 05/26/2005  . Arthritis   . Breast cancer ([place]) 2001  . Cataract   . History of tobacco abuse   . Hx of colonic polyps   . Hyperlipidemia   . Hypertension   . Hypothyroidism   . Obesity   . OSA on CPAP  09/2007   AHI 10.6/hr overall, 43.64/hr during REM  . Osteopenia   . Renal insufficiency   . Type 2 diabetes mellitus ([place])     Past Surgical History:  Procedure Laterality Date  . BACK SURGERY  05/30/2009  . BOWEL RESECTION  1974  . CATARACT EXTRACTION Bilateral    bilateral  . CHOLECYSTECTOMY  1974  . MASTECTOMY Bilateral 2001   bilateral sentinel lymph nodes bio  . NM MYOCAR PERF WALL MOTION  06/2007   dipyridamole; perfusion defect in inferior myocardium consistent with diaphragmatic attenuation, remaining myocardium with no ischemia/infarct, EF 73%; normal, low risk scan   . TOTAL ABDOMINAL HYSTERECTOMY  1975  . TRANSTHORACIC ECHOCARDIOGRAM  02/2010   EF=>55%, stage  1 diastolic dysfunction; borderline RV enlargement; LA mild-mod dilated; mild mitral annular calcif & mild MR; mild TR with normal RSVP, AV moderately sclerotics    Family History  Problem Relation Age of Onset  . Heart attack Mother   . Heart attack Father   . Colon cancer Neg Hx              Social History:  reports that she quit smoking about 16 years ago. Her smoking use included cigarettes. She quit after 13.00 years of use. She has never used smokeless tobacco. She reports that she does not drink alcohol or use drugs.  Allergies  Allergen Reactions  . Codeine Sulfate Nausea Only    MEDICATIONS:                                                                                                                     Scheduled: . sodium chloride   Intravenous Once  . brimonidine  1 drop Both Eyes BID   And  . timolol  1 drop Both Eyes BID  . FLUoxetine  20 mg Oral Daily  . insulin aspart  0-5 Units Subcutaneous QHS  . insulin aspart  0-9 Units Subcutaneous TID WC  . insulin glargine  10 Units Subcutaneous QHS  . levothyroxine  50 mcg Oral Daily  . losartan  100 mg Oral Daily  . polyethylene glycol  17 g Oral Daily  . rosuvastatin  20 mg Oral Daily  . verapamil  240 mg Oral QHS    Continuous:  [id number] **OR** acetaminophen, ondansetron **OR** ondansetron (ZOFRAN) IV   ROS:                                                                                                                                       ROS was performed and is negative except as noted in HPI  Blood pressure 115/69, pulse (!) 54, temperature 97.8 F (36.6 C), temperature source Temporal, resp. rate 17, height 5\' 8"  (1.727 m), weight 86.2 kg, SpO2 97 %.   General Examination:  Physical Exam  HEENT-  Normocephalic, no lesions, without obvious abnormality.  Normal external eye and conjunctiva.   Cardiovascular- pulses palpable throughout   Lungs-, no excessive working breathing.  Saturations within normal limits Extremities- Warm, dry and intact Musculoskeletal-no joint tenderness, deformity or swelling Skin-warm and dry, no hyperpigmentation, vitiligo, or suspicious lesions  Neurological Examination Mental Status: Alert, oriented to all but place. Decreased attention.  Speech fluent without evidence of aphasia in the context of sparse speech output. Mild dysarthria noted. Able to follow commands without difficulty. Cranial Nerves: II: ; Visual fields grossly normal. PERRL.  III,IV, VI: ptosis not present, EOMI V,VII: smile symmetric, facial light touch sensation normal bilaterally VIII: hearing normal bilaterally IX,X: uvula rises symmetrically XI: bilateral shoulder shrug XII: midline tongue extension Motor: Right : Upper extremity   5/5   Left:     Upper extremity   5/5  Lower extremity   5/5    Lower extremity   5/5 Tone and bulk: Normal tone throughout; no atrophy noted Sensory: Light touch intact throughout, bilaterally Cerebellar: normal finger-to-nose  Gait: Deferred   Lab Results: Basic Metabolic Panel: Recent Labs  Lab 12/30/18 2246 12/31/18 1844 01/01/19 0319   NA 131* 133* 131*  K 3.7 3.8 4.2  CL 99 100 102  CO2 21* 22 19*  GLUCOSE 130* 209* 230*  BUN 16 19 19   CREATININE 1.17* 1.43* 1.32*  CALCIUM 8.0* 7.9* 7.8*  MG  --   --  1.8    CBC: Recent Labs  Lab 12/30/18 2246 12/31/18 1844 01/01/19 0319  WBC 6.7 8.8 7.7  NEUTROABS 5.4  --   --   HGB 5.2* 8.8* 8.1*  HCT 20.0* 30.4* 26.8*  MCV 62.9* 69.7* 68.9*  PLT 402* 379 321    Imaging: Dg Ribs Unilateral W/chest Left  Result Date: 12/30/2018 CLINICAL DATA:  Left-sided rib pain fall EXAM: LEFT RIBS AND CHEST - 3+ VIEW COMPARISON:  07/12/2010 FINDINGS: Single-view chest demonstrates no acute airspace disease. There may be small left pleural effusion. Mild cardiomegaly with aortic atherosclerosis. No pneumothorax. Clips in the right axillary region. Left rib series demonstrates old appearing left fifth and 6 rib fractures. Acute mildly displaced left seventh, eighth, ninth and tenth anterolateral rib fractures. IMPRESSION: 1. Acute displaced left seventh through tenth rib fractures with adjacent small left effusion. 2. Negative for pneumothorax 3. Cardiomegaly Electronically Signed   By: Donavan Foil M.D.   On: 12/30/2018 22:23   Ct Head Wo Contrast  Result Date: 12/31/2018 CLINICAL DATA:  Head trauma, ataxia EXAM: CT HEAD, cervical spine WITHOUT CONTRAST TECHNIQUE: Contiguous axial images were obtained from the base of the skull through the vertex, cervical spine without intravenous contrast. COMPARISON:  December 06, 2017 FINDINGS: Brain: No evidence of acute territorial infarction, hemorrhage, hydrocephalus,extra-axial collection or mass lesion/mass effect. There is dilatation the ventricles and sulci consistent with age-related atrophy. Low-attenuation changes in the deep white matter consistent with small vessel ischemia. Vascular: No hyperdense vessel or unexpected calcification. Skull: The skull is intact. No fracture or focal lesion identified. Sinuses/Orbits: The visualized  paranasal sinuses are clear. There is fluid within the right mastoid air cell. The orbits and globes intact. Other: Unchanged small 1 cm in right parotid space nodule again identified. Cervical spine: Alignment: Physiologic Skull base and vertebrae: Visualized skull base is intact. No atlanto-occipital dissociation. The vertebral body heights are well maintained. No fracture or pathologic osseous lesion seen. Soft tissues and spinal canal: The visualized paraspinal soft tissues are unremarkable.  No prevertebral soft tissue swelling is seen. The spinal canal is grossly unremarkable, no large epidural collection or significant canal narrowing. Disc levels: Mild disc height loss with disc osteophyte and uncovertebral osteophytes is most notable at C6-C7. Upper chest: Again noted is a calcified nodule within the right thyroid lobe. There is chronic left-sided pleural thickening with a posterior fourth healed rib fracture deformity. Other: None IMPRESSION: No acute intracranial abnormality. Findings consistent with age related atrophy and chronic small vessel ischemia Fluid within the right mastoid air cell as on prior exam. No acute fracture or malalignment of the spine. Electronically Signed   By: Prudencio Pair M.D.   On: 12/31/2018 00:42   Ct Chest W Contrast  Result Date: 12/31/2018 CLINICAL DATA:  Fall with left-sided pain. Rib fractures on radiograph. EXAM: CT CHEST, ABDOMEN, AND PELVIS WITH CONTRAST TECHNIQUE: Multidetector CT imaging of the chest, abdomen and pelvis was performed following the standard protocol during bolus administration of intravenous contrast. CONTRAST:  5mL OMNIPAQUE IOHEXOL 300 MG/ML  SOLN COMPARISON:  Rib radiographs yesterday. Chest abdomen pelvis CT 12/06/2017 FINDINGS: CT CHEST FINDINGS Cardiovascular: No acute vascular injury. Aortic atherosclerosis and tortuosity. Normal heart size with coronary artery calcifications. Small pericardial effusion. Mediastinum/Nodes: No mediastinal  hemorrhage or hematoma. No pneumomediastinum. Moderate hiatal hernia with fluid-filled esophagus. Peripherally calcified right thyroid nodule. No adenopathy. Lungs/Pleura: Small left pleural effusion and adjacent compressive atelectasis. No pneumothorax. Minor dependent atelectasis in the right lower lobe with trace right pleural effusion. Lungs are otherwise clear. Trachea and mainstem bronchi are patent. Musculoskeletal: Motion artifact partially obscures the known left anterior sixth through ninth rib fractures (reported as 7 through tenth rib fractures on radiograph). No radiographically occult rib fracture. No fracture of the sternum or included clavicles and shoulder girdles. No acute fracture of the lumbar spine. Vertebral body hemangioma within T11. Subcutaneous contusion involving the left lateral chest wall. No subcutaneous emphysema. CT ABDOMEN PELVIS FINDINGS Hepatobiliary: No hepatic injury or perihepatic hematoma. Gallbladder is surgically absent. Pancreas: No ductal dilatation or inflammation. Parenchymal atrophy without evidence of injury. Spleen: No splenic injury or perisplenic hematoma. Adrenals/Urinary Tract: No adrenal hemorrhage or renal injury identified. Multiple bilateral renal cysts. Absent renal excretion on delayed phase imaging suggest renal dysfunction. Possible solid lesion in the mid left kidney measuring 16 mm, series 3, image 62, not significantly changed from prior. Bladder is unremarkable. Stomach/Bowel: Moderate to large hiatal hernia. No evidence of bowel injury. No mesenteric hematoma. No bowel wall thickening. Distal colonic diverticulosis without diverticulitis. Appendix not confidently visualized. Vascular/Lymphatic: No vascular injury. Aorto bi-iliac atherosclerosis and tortuosity. No retroperitoneal fluid. The IVC is intact. Portal vein is patent. No suspicious adenopathy. Reproductive: Status post hysterectomy. No adnexal masses. Other: No free air or free fluid. Mild  patchy contusion in the left flank. Musculoskeletal: Chronic L1 compression fracture with vertebral augmentation. Hemangioma within L3 vertebral body. Multilevel degenerative change in the spine. Bony pelvis is intact. Degenerative change of the pubic symphysis. 1 IMPRESSION: 1. Minimally displaced left anterior sixth through ninth rib fractures. Small left pleural effusion and adjacent compressive atelectasis. No pneumothorax. 2. Subcutaneous contusion of the left lateral chest wall. 3. No additional acute traumatic injury to the chest, abdomen, or pelvis. 4. Moderate to large hiatal hernia with fluid-filled esophagus. Colonic diverticulosis. 5. Absent renal excretion on delayed phase imaging suggests renal dysfunction. Indeterminate lesion in the left mid kidney measuring 16 mm is unchanged from CT 1 year ago. Aortic Atherosclerosis (ICD10-I70.0). Electronically Signed   By: Threasa Beards  Sanford M.D.   On: 12/31/2018 00:37   Ct Cervical Spine Wo Contrast  Result Date: 12/31/2018 CLINICAL DATA:  Head trauma, ataxia EXAM: CT HEAD, cervical spine WITHOUT CONTRAST TECHNIQUE: Contiguous axial images were obtained from the base of the skull through the vertex, cervical spine without intravenous contrast. COMPARISON:  December 06, 2017 FINDINGS: Brain: No evidence of acute territorial infarction, hemorrhage, hydrocephalus,extra-axial collection or mass lesion/mass effect. There is dilatation the ventricles and sulci consistent with age-related atrophy. Low-attenuation changes in the deep white matter consistent with small vessel ischemia. Vascular: No hyperdense vessel or unexpected calcification. Skull: The skull is intact. No fracture or focal lesion identified. Sinuses/Orbits: The visualized paranasal sinuses are clear. There is fluid within the right mastoid air cell. The orbits and globes intact. Other: Unchanged small 1 cm in right parotid space nodule again identified. Cervical spine: Alignment: Physiologic  Skull base and vertebrae: Visualized skull base is intact. No atlanto-occipital dissociation. The vertebral body heights are well maintained. No fracture or pathologic osseous lesion seen. Soft tissues and spinal canal: The visualized paraspinal soft tissues are unremarkable. No prevertebral soft tissue swelling is seen. The spinal canal is grossly unremarkable, no large epidural collection or significant canal narrowing. Disc levels: Mild disc height loss with disc osteophyte and uncovertebral osteophytes is most notable at C6-C7. Upper chest: Again noted is a calcified nodule within the right thyroid lobe. There is chronic left-sided pleural thickening with a posterior fourth healed rib fracture deformity. Other: None IMPRESSION: No acute intracranial abnormality. Findings consistent with age related atrophy and chronic small vessel ischemia Fluid within the right mastoid air cell as on prior exam. No acute fracture or malalignment of the spine. Electronically Signed   By: Prudencio Pair M.D.   On: 12/31/2018 00:42   Mr Angio Head Wo Contrast  Result Date: 01/01/2019 CLINICAL DATA:  Focal neuro deficit. Diabetes and hypertension. Breast cancer. Weakness. Recent fall. EXAM: MRI HEAD WITHOUT CONTRAST MRA HEAD WITHOUT CONTRAST TECHNIQUE: Multiplanar, multiecho pulse sequences of the brain and surrounding structures were obtained without intravenous contrast. Angiographic images of the head were obtained using MRA technique without contrast. COMPARISON:  CT head 12/31/2018 FINDINGS: MRI HEAD FINDINGS Brain: Moderate atrophy. Moderate chronic microvascular ischemic changes in the white matter. Negative for acute infarct.  Negative for hemorrhage or mass. Vascular: Normal arterial flow voids with exception of distal right vertebral artery which is not visualized. This could be congenital or acquired. Skull and upper cervical spine: No focal skeletal lesion Sinuses/Orbits: Mild mucosal edema paranasal sinuses. Large  right mastoid effusion. Small left mastoid effusion. Bilateral cataract surgery. Other: Motion degraded study MRA HEAD FINDINGS Image quality degraded by extensive motion. Limited information is available on the study. Left vertebral artery supplies the basilar. Right vertebral artery not visualized. Basilar is patent. Posterior cerebral arteries are patent but irregular and likely contain atherosclerotic disease Extensive irregularity in the internal carotid artery bilaterally in the precavernous and cavernous segment. Extensive irregularity in the anterior middle cerebral arteries. Suspect underlying atherosclerotic disease however there is considerable artifact. IMPRESSION: 1. No acute infarct. Atrophy and moderate chronic microvascular ischemia. 2. MRA is degraded by motion with little diagnostic information. Suspect underlying intracranial atherosclerotic disease which could be severe. Electronically Signed   By: Franchot Gallo M.D.   On: 01/01/2019 13:57   Mr Brain Wo Contrast  Result Date: 01/01/2019 CLINICAL DATA:  Focal neuro deficit. Diabetes and hypertension. Breast cancer. Weakness. Recent fall. EXAM: MRI HEAD WITHOUT CONTRAST MRA HEAD WITHOUT CONTRAST  TECHNIQUE: Multiplanar, multiecho pulse sequences of the brain and surrounding structures were obtained without intravenous contrast. Angiographic images of the head were obtained using MRA technique without contrast. COMPARISON:  CT head 12/31/2018 FINDINGS: MRI HEAD FINDINGS Brain: Moderate atrophy. Moderate chronic microvascular ischemic changes in the white matter. Negative for acute infarct.  Negative for hemorrhage or mass. Vascular: Normal arterial flow voids with exception of distal right vertebral artery which is not visualized. This could be congenital or acquired. Skull and upper cervical spine: No focal skeletal lesion Sinuses/Orbits: Mild mucosal edema paranasal sinuses. Large right mastoid effusion. Small left mastoid effusion. Bilateral  cataract surgery. Other: Motion degraded study MRA HEAD FINDINGS Image quality degraded by extensive motion. Limited information is available on the study. Left vertebral artery supplies the basilar. Right vertebral artery not visualized. Basilar is patent. Posterior cerebral arteries are patent but irregular and likely contain atherosclerotic disease Extensive irregularity in the internal carotid artery bilaterally in the precavernous and cavernous segment. Extensive irregularity in the anterior middle cerebral arteries. Suspect underlying atherosclerotic disease however there is considerable artifact. IMPRESSION: 1. No acute infarct. Atrophy and moderate chronic microvascular ischemia. 2. MRA is degraded by motion with little diagnostic information. Suspect underlying intracranial atherosclerotic disease which could be severe. Electronically Signed   By: Franchot Gallo M.D.   On: 01/01/2019 13:57   Ct Abdomen Pelvis W Contrast  Result Date: 12/31/2018 CLINICAL DATA:  Fall with left-sided pain. Rib fractures on radiograph. EXAM: CT CHEST, ABDOMEN, AND PELVIS WITH CONTRAST TECHNIQUE: Multidetector CT imaging of the chest, abdomen and pelvis was performed following the standard protocol during bolus administration of intravenous contrast. CONTRAST:  59mL OMNIPAQUE IOHEXOL 300 MG/ML  SOLN COMPARISON:  Rib radiographs yesterday. Chest abdomen pelvis CT 12/06/2017 FINDINGS: CT CHEST FINDINGS Cardiovascular: No acute vascular injury. Aortic atherosclerosis and tortuosity. Normal heart size with coronary artery calcifications. Small pericardial effusion. Mediastinum/Nodes: No mediastinal hemorrhage or hematoma. No pneumomediastinum. Moderate hiatal hernia with fluid-filled esophagus. Peripherally calcified right thyroid nodule. No adenopathy. Lungs/Pleura: Small left pleural effusion and adjacent compressive atelectasis. No pneumothorax. Minor dependent atelectasis in the right lower lobe with trace right pleural  effusion. Lungs are otherwise clear. Trachea and mainstem bronchi are patent. Musculoskeletal: Motion artifact partially obscures the known left anterior sixth through ninth rib fractures (reported as 7 through tenth rib fractures on radiograph). No radiographically occult rib fracture. No fracture of the sternum or included clavicles and shoulder girdles. No acute fracture of the lumbar spine. Vertebral body hemangioma within T11. Subcutaneous contusion involving the left lateral chest wall. No subcutaneous emphysema. CT ABDOMEN PELVIS FINDINGS Hepatobiliary: No hepatic injury or perihepatic hematoma. Gallbladder is surgically absent. Pancreas: No ductal dilatation or inflammation. Parenchymal atrophy without evidence of injury. Spleen: No splenic injury or perisplenic hematoma. Adrenals/Urinary Tract: No adrenal hemorrhage or renal injury identified. Multiple bilateral renal cysts. Absent renal excretion on delayed phase imaging suggest renal dysfunction. Possible solid lesion in the mid left kidney measuring 16 mm, series 3, image 62, not significantly changed from prior. Bladder is unremarkable. Stomach/Bowel: Moderate to large hiatal hernia. No evidence of bowel injury. No mesenteric hematoma. No bowel wall thickening. Distal colonic diverticulosis without diverticulitis. Appendix not confidently visualized. Vascular/Lymphatic: No vascular injury. Aorto bi-iliac atherosclerosis and tortuosity. No retroperitoneal fluid. The IVC is intact. Portal vein is patent. No suspicious adenopathy. Reproductive: Status post hysterectomy. No adnexal masses. Other: No free air or free fluid. Mild patchy contusion in the left flank. Musculoskeletal: Chronic L1 compression fracture with vertebral augmentation.  Hemangioma within L3 vertebral body. Multilevel degenerative change in the spine. Bony pelvis is intact. Degenerative change of the pubic symphysis. 1 IMPRESSION: 1. Minimally displaced left anterior sixth through ninth  rib fractures. Small left pleural effusion and adjacent compressive atelectasis. No pneumothorax. 2. Subcutaneous contusion of the left lateral chest wall. 3. No additional acute traumatic injury to the chest, abdomen, or pelvis. 4. Moderate to large hiatal hernia with fluid-filled esophagus. Colonic diverticulosis. 5. Absent renal excretion on delayed phase imaging suggests renal dysfunction. Indeterminate lesion in the left mid kidney measuring 16 mm is unchanged from CT 1 year ago. Aortic Atherosclerosis (ICD10-I70.0). Electronically Signed   By: Keith Rake M.D.   On: 12/31/2018 00:37    Assessment:  85 y.o. female with PMHx of DM2, OSA ( cpap), HTN, HLD, hypothyroidism, breast cancer (2001) who presented to [facility] ED s/p fall 12/30/2018. Neurology consulted for possible stroke/ word finding difficulty.  1. CTH did not show hemorrhage. MRI of brain was negative for stroke. MRA was motion degraded.  2. Neurological exam nonlateralizing. The patient's speech deficit is not consistent with a lesional expressive or receptive aphasia. Overall exam findings are most consistent with diffuse cortical dysfunction. DDx includes mild hypoglycemic brain injury, hospital delirium and AMS due to UTI.  3. Intermittent unwitnessed or subclinical seizures are possible but felt to be less likely.   Recommendations: - Avoid sedating medications - Treat UTI - EEG - Outpatient Neurology follow up at [facility] or [facility] to assess for possible underlying dementia.  - Neurohospitalist service will sign off. Please call if there are additional questions.   Laurey Morale, MSN, NP-C [facility] [phone]  I have seen and examined the patient. 85 year old female with confusion. Exam best localizes as mild bihemispheric dysfunction. Recommendations as above.  Electronically signed: Dr. Kerney Elbe 01/01/2019, 2:29 PM

## 2019-01-01 NOTE — Progress Notes (Signed)
Initial Nutrition Assessment  DOCUMENTATION CODES:   Not applicable  INTERVENTION:  Ensure Enlive po BID, each supplement provides 350 kcal and 20 grams of protein MVI with minerals daily  NUTRITION DIAGNOSIS:   Increased nutrient needs related to acute illness as evidenced by estimated needs.  GOAL:   Patient will meet greater than or equal to 90% of their needs  MONITOR:   PO intake, Labs, I & O's, Supplement acceptance, Weight trends  REASON FOR ASSESSMENT:   Consult Assessment of nutrition requirement/status  ASSESSMENT:  RD working remotely.  85 year old female with medical history significant of T2DM, HTN, OSA on CPAP, hypothyroidism, hx of breast cancer, who presented via EMS s/p fall with complaints of weakness. Patient reports recent history of falls and having epigastric discomfort with poor appetite over the past few weeks.  Upon EMS arrival, patient found to be weak and mildly confused; BS of 44 and was given D50. Hemoglobin 5.6 in ED s/p 2 units PRBC; CT revealed left-sided rib fx with some continuation of left chest wall. Patient admitted with symptomatic anemia.  Unable to obtain nutrition history at this time. Family reported to have observed patient with new onset difficulty finding words and slurred speech over the past week, concerning for CVA. Patient currently in MRI per chart review. EGD this afternoon, results pending.   Patient receiving HH diet, no recorded meals at this time. Will provide Ensure to aid with calorie/protein needs and continue to monitor for po intake.   Weight history reviewed: noted 35 lb (15.6%) weight loss over the past year, which is insignificant for time frame. Still concerning considering advanced age and history of present illness. Full assessment to follow.  Medications reviewed and include: SSI, Lantus Labs: CBGS 165-266 Na 131 Ca corrects for low albumin 9.2  NUTRITION - FOCUSED PHYSICAL EXAM: Unable to complete at  this time; RD working remotely.  Diet Order:   Diet Order            Diet Carb Modified Fluid consistency: Thin; Room service appropriate? Yes  Diet effective now              EDUCATION NEEDS:   No education needs have been identified at this time  Skin:  Skin Assessment: Reviewed RN Assessment  Last BM:  10/21  Height:   Ht Readings from Last 1 Encounters:  01/01/19 5\' 8"  (1.727 m)    Weight:   Wt Readings from Last 1 Encounters:  01/01/19 86.2 kg    Ideal Body Weight:  63.6 kg  BMI:  Body mass index is 28.89 kg/m.  Estimated Nutritional Needs:   Kcal:  1650-1800  Protein:  83-90  Fluid:  > 1.5 L/day  Lajuan Lines, RD, LDN Clinical Nutrition [facility] Telephone [phone] After Hours/Weekend Pager: [phone]

## 2019-01-01 NOTE — Progress Notes (Signed)
PROGRESS NOTE    Diana [name]  [id number] DOB: 06/18/1933 DOA: 12/30/2018 PCP: [name], PA-C   Brief Narrative:  Diana [name] is Diana [name] 85 y.o. female with history of diabetes mellitus type 2 on insulin, hypertension, hypothyroidism, history of breast cancer in remission, sleep apnea had Diana [name] fall at home and was found to be very weak and patient states she called her neighbor and eventually EMS was called.  On arrival patient was found to be weak and mildly confused and when EMS checked her blood sugar it was around 44 patient was given D50 following which blood sugar improved more than 100 and patient was brought to the ER.  Patient states she has been having frequent falls recently most of the time because she feels weak.  Not sure if she lost consciousness.  Denies hitting her head.  During one of the falls 2 days ago she had Diana [name] left side of the chest and since been hurting her.  Denies any difficulty breathing of any nausea vomiting or diarrhea but has been having some epigastric discomfort with Diana [name] poor appetite last few weeks.  ED Course: In the ER patient was afebrile.  Labs show creatinine 1.1 glucose 130 albumin 2.7 total protein 5.6 hemoglobin was 5.2 WBC 6.7 platelets 402.  COVID-19 test was negative stool for occult blood was negative.  Patient had CT head CT C-spine CT chest CT abdomen and pelvis which showed left-sided rib fracture with some continuation of the left chest wall.  Trauma service was consulted.  Given the multiple medical issues including severe anemia hypoglycemic episodes patient is being admitted under medical service.  EKG shows Diana [name]. fib rate controlled.  This happened to be new.  Assessment & Plan:   Principal Problem:   Symptomatic anemia Active Problems:   Breast cancer (HCC)   HTN (hypertension)   OSA on CPAP   Hypothyroidism   DM2 (diabetes mellitus, type 2) (HCC)   Hypoglycemia   Ribs, multiple fractures   Hiatal  hernia   1. Expressive Aphasia   Slurred Speech: Seems to have word finding difficulties and slurred speech.  No other obvious neuro deficits on exam, but she's had falls over the past week.  She notes that this (speech trouble) started about 1 week ago, not clear how accurate this is (spoke to her sister in law who hasn't seen her in Diana [name] while, but said her housekeeper Diana [name], said things were ok on Monday - she noticed increased falls earlier this week, but did not notice any change in speech until yesterday).  Concerning for CVA.  Will follow MRI brain.  Head CT on presentation without acute intracranial abnormality. 1. PT/OT/SLP 2. Echo as below, Carotid dopplers, MRA brain. 3. Neurology c/s pending imaging   2. Symptomatic anemia - seems to be most likely cause of her recent falls and weakness.  No obvous GI bleeding.  Negative FOBT. 1. Transfuse 2 units pRBC 2. Follow post transfusion H/H - Hb 8.1 today, follow  3. Add on iron panel, b12, folate, ferritin, retics to previous blood draw if possible 4. GI c/s, appreciate recs - EGD with severe ulcerative esophagitis, GE junction stricture -> biopsied.  PPI BID indefinitely.  Feraheme, iron.  Needs f/u with GI in 6-8 weeks.    3. Hypoglycemia   T2DM, now hyperglycemia - most likely 2/2 insulin with her amaryl.  Will hold both and follow BG q6.  1. BG now elevated - start lantus 10 units nightly and SSI,  continue to monitor. 2. Follow A1c 5.2 (though with anemia) and TSH (wnl).  Hold off on checking cortisol for now.  4. New onset Diana [name]. Fib - hold off on anticoagulation for now given severe anemia - with concern for stroke above, will need to discuss with GI when ok for anticoagulation. 1. Follow echo (EF 60-65%, see below), TSH 2. Continue verapamil, uptitrate as needed 3. Appreciate cardiology c/s  5. Left-sided rib fracture status post fall being followed by trauma surgery.  Pain control, IS, pulm toilet, PT.   6. Hypothyroidism on Synthroid  check TSH we will dose Synthroid IV for now.  7. Hypertension -verapamil and losartan   8. Abnormal left kidney lesion seen in the CAT scan which will need follow-up.  9. Large hiatal hernia seen in the CAT scan.  Will need follow-up.  10. Sleep apnea on CPAP.  11. History of breast cancer in remission.  12. Severe protein calorie malnutrition will need nutrition consult.   DVT prophylaxis: SCD Code Status: full  Family Communication: none at bedside Disposition Plan: pending further improvement   Consultants:   GI  Cardiology  Trauma surgery  Procedures:  Echo IMPRESSIONS    1. Left ventricular ejection fraction, by visual estimation, is 60 to 65%. The left ventricle has normal function. Normal left ventricular size. There is no left ventricular hypertrophy.  2. Left ventricular diastolic function could not be evaluated pattern of LV diastolic filling.  3. Global right ventricle has normal systolic function.The right ventricular size is not well visualized. No increase in right ventricular wall thickness.  4. Left atrial size was normal.  5. Right atrial size was not well visualized.  6. Moderate mitral annular calcification.  7. The mitral valve was not well visualized. Trace mitral valve regurgitation. No evidence of mitral stenosis.  8. The tricuspid valve is normal in structure. Tricuspid valve regurgitation was not visualized by color flow Doppler.  9. The aortic valve is normal in structure. Aortic valve regurgitation was not visualized by color flow Doppler. Structurally normal aortic valve, with no evidence of sclerosis or stenosis. 10. The pulmonic valve was not well visualized. Pulmonic valve regurgitation is not visualized by color flow Doppler. 11. TR signal is inadequate for assessing pulmonary artery systolic pressure.  Antimicrobials:  Anti-infectives (From admission, onward)   None      Subjective: Tells me her difficulty with speech started  about 1 week ago Discussed with [name], she hasn't seen her in Diana [name] while Diana [name], caregiver, said she noticed increased falls 1 week ago, she didn't notice speech until yesterday  Objective: Vitals:   01/01/19 0840 01/01/19 0850 01/01/19 0900 01/01/19 0916  BP: (!) 107/41 104/61 (!) 119/47 (!) 98/49  Pulse: (!) 54 (!) 53 (!) 53 (!) 25  Resp: 20 (!) 21 (!) 21 (!) 21  Temp: 97.8 F (36.6 C)     TempSrc: Temporal     SpO2: 100% 95% 96% 94%  Weight:      Height:        Intake/Output Summary (Last 24 hours) at 01/01/2019 1001 Last data filed at 01/01/2019 0800 Gross per 24 hour  Intake 267.4 ml  Output --  Net 267.4 ml   Filed Weights   01/01/19 0818  Weight: 86.2 kg    Examination:  General: No acute distress. Cardiovascular: Heart sounds show Brilynn Biasi regular rate, and rhythm. Lungs: Clear to auscultation bilaterally  Abdomen: Soft, nontender, nondistended  Neurological: Alert and oriented 3. Moves all extremities 4 with  equal strength. CN 2-12 intact.  Speech and word finding difficulties.  Dysarthria. Skin: Warm and dry. No rashes or lesions. Extremities: No clubbing or cyanosis. No edema.  Psychiatric: Mood and affect are normal. Insight and judgment are appropriate.     Data Reviewed: I have personally reviewed following labs and imaging studies  CBC: Recent Labs  Lab 12/30/18 2246 12/31/18 1844 01/01/19 0319  WBC 6.7 8.8 7.7  NEUTROABS 5.4  --   --   HGB 5.2* 8.8* 8.1*  HCT 20.0* 30.4* 26.8*  MCV 62.9* 69.7* 68.9*  PLT 402* 379 [id number]   Basic Metabolic Panel: Recent Labs  Lab 12/30/18 2246 12/31/18 1844 01/01/19 0319  NA 131* 133* 131*  K 3.7 3.8 4.2  CL 99 100 102  CO2 21* 22 19*  GLUCOSE 130* 209* 230*  BUN 16 19 19   CREATININE 1.17* 1.43* 1.32*  CALCIUM 8.0* 7.9* 7.8*  MG  --   --  1.8   GFR: Estimated Creatinine Clearance: 37.1 mL/min (Shaneque Merkle) (by C-G formula based on SCr of 1.32 mg/dL (H)). Liver Function Tests: Recent Labs  Lab 12/30/18 2246  12/31/18 1844 01/01/19 0319  AST 25 18 21   ALT 19 18 16   ALKPHOS 70 74 63  BILITOT 1.4* 1.7* 1.2  PROT 5.6* 5.6* 5.0*  ALBUMIN 2.7* 2.6* 2.3*   No results for input(s): LIPASE, AMYLASE in the last 168 hours. No results for input(s): AMMONIA in the last 168 hours. Coagulation Profile: No results for input(s): INR, PROTIME in the last 168 hours. Cardiac Enzymes: No results for input(s): CKTOTAL, CKMB, CKMBINDEX, TROPONINI in the last 168 hours. BNP (last 3 results) No results for input(s): PROBNP in the last 8760 hours. HbA1C: Recent Labs    12/31/18 1844  HGBA1C 5.2   CBG: Recent Labs  Lab 12/31/18 1535 12/31/18 2018 12/31/18 2211 01/01/19 0018 01/01/19 0628  GLUCAP 191* 254* 259* 266* 214*   Lipid Profile: No results for input(s): CHOL, HDL, LDLCALC, TRIG, CHOLHDL, LDLDIRECT in the last 72 hours. Thyroid Function Tests: Recent Labs    12/31/18 1844  TSH 4.185   Anemia Panel: Recent Labs    12/31/18 1844  VITAMINB12 1,040*  FOLATE 10.4  FERRITIN 14  TIBC 325  IRON 42  RETICCTPCT 1.0   Sepsis Labs: No results for input(s): PROCALCITON, LATICACIDVEN in the last 168 hours.  Recent Results (from the past 240 hour(s))  SARS CORONAVIRUS 2 (TAT 6-24 HRS) Nasopharyngeal Nasopharyngeal Swab     Status: None   Collection Time: 12/30/18 11:32 PM   Specimen: Nasopharyngeal Swab  Result Value Ref Range Status   SARS Coronavirus 2 NEGATIVE NEGATIVE Final    Comment: (NOTE) SARS-CoV-2 target nucleic acids are NOT DETECTED. The SARS-CoV-2 RNA is generally detectable in upper and lower respiratory specimens during the acute phase of infection. Negative results do not preclude SARS-CoV-2 infection, do not rule out co-infections with other pathogens, and should not be used as the sole basis for treatment or other patient management decisions. Negative results must be combined with clinical observations, patient history, and epidemiological information. The  expected result is Negative. Fact Sheet for Patients: SugarRoll.be Fact Sheet for Healthcare Providers: https://www.woods-mathews.com/ This test is not yet approved or cleared by the [place] FDA and  has been authorized for detection and/or diagnosis of SARS-CoV-2 by FDA under an Emergency Use Authorization (EUA). This EUA will remain  in effect (meaning this test can be used) for the duration of the COVID-19 declaration under Section 56  4(b)(1) of the Act, 21 U.S.C. section 360bbb-3(b)(1), unless the authorization is terminated or revoked sooner. Performed at [facility], [place] [address] [place], [place] [address]          Radiology Studies: Dg Ribs Unilateral W/chest Left  Result Date: 12/30/2018 CLINICAL DATA:  Left-sided rib pain fall EXAM: LEFT RIBS AND CHEST - 3+ VIEW COMPARISON:  07/12/2010 FINDINGS: Single-view chest demonstrates no acute airspace disease. There may be small left pleural effusion. Mild cardiomegaly with aortic atherosclerosis. No pneumothorax. Clips in the right axillary region. Left rib series demonstrates old appearing left fifth and 6 rib fractures. Acute mildly displaced left seventh, eighth, ninth and tenth anterolateral rib fractures. IMPRESSION: 1. Acute displaced left seventh through tenth rib fractures with adjacent small left effusion. 2. Negative for pneumothorax 3. Cardiomegaly Electronically Signed   By: Donavan Foil M.D.   On: 12/30/2018 22:23   Ct Head Wo Contrast  Result Date: 12/31/2018 CLINICAL DATA:  Head trauma, ataxia EXAM: CT HEAD, cervical spine WITHOUT CONTRAST TECHNIQUE: Contiguous axial images were obtained from the base of the skull through the vertex, cervical spine without intravenous contrast. COMPARISON:  December 06, 2017 FINDINGS: Brain: No evidence of acute territorial infarction, hemorrhage, hydrocephalus,extra-axial collection or mass lesion/mass effect. There is  dilatation the ventricles and sulci consistent with age-related atrophy. Low-attenuation changes in the deep white matter consistent with small vessel ischemia. Vascular: No hyperdense vessel or unexpected calcification. Skull: The skull is intact. No fracture or focal lesion identified. Sinuses/Orbits: The visualized paranasal sinuses are clear. There is fluid within the right mastoid air cell. The orbits and globes intact. Other: Unchanged small 1 cm in right parotid space nodule again identified. Cervical spine: Alignment: Physiologic Skull base and vertebrae: Visualized skull base is intact. No atlanto-occipital dissociation. The vertebral body heights are well maintained. No fracture or pathologic osseous lesion seen. Soft tissues and spinal canal: The visualized paraspinal soft tissues are unremarkable. No prevertebral soft tissue swelling is seen. The spinal canal is grossly unremarkable, no large epidural collection or significant canal narrowing. Disc levels: Mild disc height loss with disc osteophyte and uncovertebral osteophytes is most notable at C6-C7. Upper chest: Again noted is Aldahir Litaker calcified nodule within the right thyroid lobe. There is chronic left-sided pleural thickening with Kemonte Ullman posterior fourth healed rib fracture deformity. Other: None IMPRESSION: No acute intracranial abnormality. Findings consistent with age related atrophy and chronic small vessel ischemia Fluid within the right mastoid air cell as on prior exam. No acute fracture or malalignment of the spine. Electronically Signed   By: Prudencio Pair M.D.   On: 12/31/2018 00:42   Ct Chest W Contrast  Result Date: 12/31/2018 CLINICAL DATA:  Fall with left-sided pain. Rib fractures on radiograph. EXAM: CT CHEST, ABDOMEN, AND PELVIS WITH CONTRAST TECHNIQUE: Multidetector CT imaging of the chest, abdomen and pelvis was performed following the standard protocol during bolus administration of intravenous contrast. CONTRAST:  23mL OMNIPAQUE  IOHEXOL 300 MG/ML  SOLN COMPARISON:  Rib radiographs yesterday. Chest abdomen pelvis CT 12/06/2017 FINDINGS: CT CHEST FINDINGS Cardiovascular: No acute vascular injury. Aortic atherosclerosis and tortuosity. Normal heart size with coronary artery calcifications. Small pericardial effusion. Mediastinum/Nodes: No mediastinal hemorrhage or hematoma. No pneumomediastinum. Moderate hiatal hernia with fluid-filled esophagus. Peripherally calcified right thyroid nodule. No adenopathy. Lungs/Pleura: Small left pleural effusion and adjacent compressive atelectasis. No pneumothorax. Minor dependent atelectasis in the right lower lobe with trace right pleural effusion. Lungs are otherwise clear. Trachea and mainstem bronchi are patent. Musculoskeletal:  Motion artifact partially obscures the known left anterior sixth through ninth rib fractures (reported as 7 through tenth rib fractures on radiograph). No radiographically occult rib fracture. No fracture of the sternum or included clavicles and shoulder girdles. No acute fracture of the lumbar spine. Vertebral body hemangioma within T11. Subcutaneous contusion involving the left lateral chest wall. No subcutaneous emphysema. CT ABDOMEN PELVIS FINDINGS Hepatobiliary: No hepatic injury or perihepatic hematoma. Gallbladder is surgically absent. Pancreas: No ductal dilatation or inflammation. Parenchymal atrophy without evidence of injury. Spleen: No splenic injury or perisplenic hematoma. Adrenals/Urinary Tract: No adrenal hemorrhage or renal injury identified. Multiple bilateral renal cysts. Absent renal excretion on delayed phase imaging suggest renal dysfunction. Possible solid lesion in the mid left kidney measuring 16 mm, series 3, image 62, not significantly changed from prior. Bladder is unremarkable. Stomach/Bowel: Moderate to large hiatal hernia. No evidence of bowel injury. No mesenteric hematoma. No bowel wall thickening. Distal colonic diverticulosis without  diverticulitis. Appendix not confidently visualized. Vascular/Lymphatic: No vascular injury. Aorto bi-iliac atherosclerosis and tortuosity. No retroperitoneal fluid. The IVC is intact. Portal vein is patent. No suspicious adenopathy. Reproductive: Status post hysterectomy. No adnexal masses. Other: No free air or free fluid. Mild patchy contusion in the left flank. Musculoskeletal: Chronic L1 compression fracture with vertebral augmentation. Hemangioma within L3 vertebral body. Multilevel degenerative change in the spine. Bony pelvis is intact. Degenerative change of the pubic symphysis. 1 IMPRESSION: 1. Minimally displaced left anterior sixth through ninth rib fractures. Small left pleural effusion and adjacent compressive atelectasis. No pneumothorax. 2. Subcutaneous contusion of the left lateral chest wall. 3. No additional acute traumatic injury to the chest, abdomen, or pelvis. 4. Moderate to large hiatal hernia with fluid-filled esophagus. Colonic diverticulosis. 5. Absent renal excretion on delayed phase imaging suggests renal dysfunction. Indeterminate lesion in the left mid kidney measuring 16 mm is unchanged from CT 1 year ago. Aortic Atherosclerosis (ICD10-I70.0). Electronically Signed   By: Keith Rake M.D.   On: 12/31/2018 00:37   Ct Cervical Spine Wo Contrast  Result Date: 12/31/2018 CLINICAL DATA:  Head trauma, ataxia EXAM: CT HEAD, cervical spine WITHOUT CONTRAST TECHNIQUE: Contiguous axial images were obtained from the base of the skull through the vertex, cervical spine without intravenous contrast. COMPARISON:  December 06, 2017 FINDINGS: Brain: No evidence of acute territorial infarction, hemorrhage, hydrocephalus,extra-axial collection or mass lesion/mass effect. There is dilatation the ventricles and sulci consistent with age-related atrophy. Low-attenuation changes in the deep white matter consistent with small vessel ischemia. Vascular: No hyperdense vessel or unexpected  calcification. Skull: The skull is intact. No fracture or focal lesion identified. Sinuses/Orbits: The visualized paranasal sinuses are clear. There is fluid within the right mastoid air cell. The orbits and globes intact. Other: Unchanged small 1 cm in right parotid space nodule again identified. Cervical spine: Alignment: Physiologic Skull base and vertebrae: Visualized skull base is intact. No atlanto-occipital dissociation. The vertebral body heights are well maintained. No fracture or pathologic osseous lesion seen. Soft tissues and spinal canal: The visualized paraspinal soft tissues are unremarkable. No prevertebral soft tissue swelling is seen. The spinal canal is grossly unremarkable, no large epidural collection or significant canal narrowing. Disc levels: Mild disc height loss with disc osteophyte and uncovertebral osteophytes is most notable at C6-C7. Upper chest: Again noted is Servando Kyllonen calcified nodule within the right thyroid lobe. There is chronic left-sided pleural thickening with Jaikob Borgwardt posterior fourth healed rib fracture deformity. Other: None IMPRESSION: No acute intracranial abnormality. Findings consistent with age related atrophy  and chronic small vessel ischemia Fluid within the right mastoid air cell as on prior exam. No acute fracture or malalignment of the spine. Electronically Signed   By: Prudencio Pair M.D.   On: 12/31/2018 00:42   Ct Abdomen Pelvis W Contrast  Result Date: 12/31/2018 CLINICAL DATA:  Fall with left-sided pain. Rib fractures on radiograph. EXAM: CT CHEST, ABDOMEN, AND PELVIS WITH CONTRAST TECHNIQUE: Multidetector CT imaging of the chest, abdomen and pelvis was performed following the standard protocol during bolus administration of intravenous contrast. CONTRAST:  22mL OMNIPAQUE IOHEXOL 300 MG/ML  SOLN COMPARISON:  Rib radiographs yesterday. Chest abdomen pelvis CT 12/06/2017 FINDINGS: CT CHEST FINDINGS Cardiovascular: No acute vascular injury. Aortic atherosclerosis and  tortuosity. Normal heart size with coronary artery calcifications. Small pericardial effusion. Mediastinum/Nodes: No mediastinal hemorrhage or hematoma. No pneumomediastinum. Moderate hiatal hernia with fluid-filled esophagus. Peripherally calcified right thyroid nodule. No adenopathy. Lungs/Pleura: Small left pleural effusion and adjacent compressive atelectasis. No pneumothorax. Minor dependent atelectasis in the right lower lobe with trace right pleural effusion. Lungs are otherwise clear. Trachea and mainstem bronchi are patent. Musculoskeletal: Motion artifact partially obscures the known left anterior sixth through ninth rib fractures (reported as 7 through tenth rib fractures on radiograph). No radiographically occult rib fracture. No fracture of the sternum or included clavicles and shoulder girdles. No acute fracture of the lumbar spine. Vertebral body hemangioma within T11. Subcutaneous contusion involving the left lateral chest wall. No subcutaneous emphysema. CT ABDOMEN PELVIS FINDINGS Hepatobiliary: No hepatic injury or perihepatic hematoma. Gallbladder is surgically absent. Pancreas: No ductal dilatation or inflammation. Parenchymal atrophy without evidence of injury. Spleen: No splenic injury or perisplenic hematoma. Adrenals/Urinary Tract: No adrenal hemorrhage or renal injury identified. Multiple bilateral renal cysts. Absent renal excretion on delayed phase imaging suggest renal dysfunction. Possible solid lesion in the mid left kidney measuring 16 mm, series 3, image 62, not significantly changed from prior. Bladder is unremarkable. Stomach/Bowel: Moderate to large hiatal hernia. No evidence of bowel injury. No mesenteric hematoma. No bowel wall thickening. Distal colonic diverticulosis without diverticulitis. Appendix not confidently visualized. Vascular/Lymphatic: No vascular injury. Aorto bi-iliac atherosclerosis and tortuosity. No retroperitoneal fluid. The IVC is intact. Portal vein is  patent. No suspicious adenopathy. Reproductive: Status post hysterectomy. No adnexal masses. Other: No free air or free fluid. Mild patchy contusion in the left flank. Musculoskeletal: Chronic L1 compression fracture with vertebral augmentation. Hemangioma within L3 vertebral body. Multilevel degenerative change in the spine. Bony pelvis is intact. Degenerative change of the pubic symphysis. 1 IMPRESSION: 1. Minimally displaced left anterior sixth through ninth rib fractures. Small left pleural effusion and adjacent compressive atelectasis. No pneumothorax. 2. Subcutaneous contusion of the left lateral chest wall. 3. No additional acute traumatic injury to the chest, abdomen, or pelvis. 4. Moderate to large hiatal hernia with fluid-filled esophagus. Colonic diverticulosis. 5. Absent renal excretion on delayed phase imaging suggests renal dysfunction. Indeterminate lesion in the left mid kidney measuring 16 mm is unchanged from CT 1 year ago. Aortic Atherosclerosis (ICD10-I70.0). Electronically Signed   By: Keith Rake M.D.   On: 12/31/2018 00:37        Scheduled Meds:  sodium chloride   Intravenous Once   brimonidine  1 drop Both Eyes BID   And   timolol  1 drop Both Eyes BID   FLUoxetine  20 mg Oral Daily   levothyroxine  50 mcg Oral Daily   losartan  100 mg Oral Daily   polyethylene glycol  17 g Oral Daily  rosuvastatin  20 mg Oral Daily   verapamil  240 mg Oral QHS   Continuous Infusions:   LOS: 0 days    Time spent: over 30 min    Fayrene Helper, MD [facility] Pager AMION  If 7PM-7AM, please contact night-coverage [email or web address] Password [email or web address] 01/01/2019, 10:01 AM

## 2019-01-01 NOTE — Progress Notes (Signed)
Carotid has been completed.   Preliminary results in CV Proc.   Abram Sander 01/01/2019 4:50 PM

## 2019-01-02 ENCOUNTER — Encounter (HOSPITAL_COMMUNITY): Payer: Self-pay | Admitting: Gastroenterology

## 2019-01-02 DIAGNOSIS — D649 Anemia, unspecified: Secondary | ICD-10-CM | POA: Diagnosis not present

## 2019-01-02 LAB — GLUCOSE, CAPILLARY
Glucose-Capillary: 102 mg/dL — ABNORMAL HIGH (ref 70–99)
Glucose-Capillary: 121 mg/dL — ABNORMAL HIGH (ref 70–99)
Glucose-Capillary: 140 mg/dL — ABNORMAL HIGH (ref 70–99)
Glucose-Capillary: 192 mg/dL — ABNORMAL HIGH (ref 70–99)
Glucose-Capillary: 202 mg/dL — ABNORMAL HIGH (ref 70–99)
Glucose-Capillary: 94 mg/dL (ref 70–99)

## 2019-01-02 LAB — COMPREHENSIVE METABOLIC PANEL
ALT: 16 U/L (ref 0–44)
AST: 19 U/L (ref 15–41)
Albumin: 2.2 g/dL — ABNORMAL LOW (ref 3.5–5.0)
Alkaline Phosphatase: 57 U/L (ref 38–126)
Anion gap: 7 (ref 5–15)
BUN: 19 mg/dL (ref 8–23)
CO2: 23 mmol/L (ref 22–32)
Calcium: 8.1 mg/dL — ABNORMAL LOW (ref 8.9–10.3)
Chloride: 103 mmol/L (ref 98–111)
Creatinine, Ser: 1.23 mg/dL — ABNORMAL HIGH (ref 0.44–1.00)
GFR calc Af Amer: 47 mL/min — ABNORMAL LOW (ref >60)
GFR calc non Af Amer: 41 mL/min — ABNORMAL LOW (ref >60)
Glucose, Bld: 95 mg/dL (ref 70–99)
Potassium: 3.9 mmol/L (ref 3.5–5.1)
Sodium: 133 mmol/L — ABNORMAL LOW (ref 135–145)
Total Bilirubin: 0.8 mg/dL (ref 0.3–1.2)
Total Protein: 4.9 g/dL — ABNORMAL LOW (ref 6.5–8.1)

## 2019-01-02 LAB — CBC
HCT: 25.4 % — ABNORMAL LOW (ref 36.0–46.0)
Hemoglobin: 7.5 g/dL — ABNORMAL LOW (ref 12.0–15.0)
MCH: 20.8 pg — ABNORMAL LOW (ref 26.0–34.0)
MCHC: 29.5 g/dL — ABNORMAL LOW (ref 30.0–36.0)
MCV: 70.4 fL — ABNORMAL LOW (ref 80.0–100.0)
Platelets: 336 10*3/uL (ref 150–400)
RBC: 3.61 MIL/uL — ABNORMAL LOW (ref 3.87–5.11)
RDW: 26 % — ABNORMAL HIGH (ref 11.5–15.5)
WBC: 9.2 10*3/uL (ref 4.0–10.5)
nRBC: 0.7 % — ABNORMAL HIGH (ref 0.0–0.2)

## 2019-01-02 LAB — HEMOGLOBIN AND HEMATOCRIT, BLOOD
HCT: 25.2 % — ABNORMAL LOW (ref 36.0–46.0)
Hemoglobin: 7.5 g/dL — ABNORMAL LOW (ref 12.0–15.0)

## 2019-01-02 LAB — MAGNESIUM: Magnesium: 1.9 mg/dL (ref 1.7–2.4)

## 2019-01-02 MED ORDER — SODIUM CHLORIDE 0.9 % IV SOLN
1.0000 g | INTRAVENOUS | Status: DC
  Administered 2019-01-02 – 2019-01-03 (×2): 1 g via INTRAVENOUS
  Filled 2019-01-02 (×2): qty 1
  Filled 2019-01-02: qty 10

## 2019-01-02 MED ORDER — VERAPAMIL HCL ER 180 MG PO TBCR
180.0000 mg | EXTENDED_RELEASE_TABLET | Freq: Every day | ORAL | Status: DC
  Administered 2019-01-02: 180 mg via ORAL
  Filled 2019-01-02: qty 1

## 2019-01-02 MED ORDER — INSULIN GLARGINE 100 UNIT/ML ~~LOC~~ SOLN
5.0000 [IU] | Freq: Every day | SUBCUTANEOUS | Status: DC
  Administered 2019-01-02 – 2019-01-04 (×3): 5 [IU] via SUBCUTANEOUS
  Filled 2019-01-02 (×6): qty 0.05

## 2019-01-02 MED ORDER — HEPARIN (PORCINE) 25000 UT/250ML-% IV SOLN
1000.0000 [IU]/h | INTRAVENOUS | Status: DC
  Administered 2019-01-02 – 2019-01-03 (×2): 1000 [IU]/h via INTRAVENOUS
  Filled 2019-01-02 (×2): qty 250

## 2019-01-02 NOTE — Progress Notes (Signed)
PROGRESS NOTE    Diana [name]  [id number] DOB: 21-Mar-1933 DOA: 12/30/2018 PCP: [name], PA-C   Brief Narrative:  Diana [name] is Diana [name] 85 y.o. female with history of diabetes mellitus type 2 on insulin, hypertension, hypothyroidism, history of breast cancer in remission, sleep apnea had Diana [name] fall at home and was found to be very weak and patient states she called her neighbor and eventually EMS was called.  On arrival patient was found to be weak and mildly confused and when EMS checked her blood sugar it was around 44 patient was given D50 following which blood sugar improved more than 100 and patient was brought to the ER.  Patient states she has been having frequent falls recently most of the time because she feels weak.  Not sure if she lost consciousness.  Denies hitting her head.  During one of the falls 2 days ago she had Diana [name] left side of the chest and since been hurting her.  Denies any difficulty breathing of any nausea vomiting or diarrhea but has been having some epigastric discomfort with Diana [name] poor appetite last few weeks.  ED Course: In the ER patient was afebrile.  Labs show creatinine 1.1 glucose 130 albumin 2.7 total protein 5.6 hemoglobin was 5.2 WBC 6.7 platelets 402.  COVID-19 test was negative stool for occult blood was negative.  Patient had CT head CT C-spine CT chest CT abdomen and pelvis which showed left-sided rib fracture with some continuation of the left chest wall.  Trauma service was consulted.  Given the multiple medical issues including severe anemia hypoglycemic episodes patient is being admitted under medical service.  EKG shows Diana [name]. fib rate controlled.  This happened to be new.  Assessment & Plan:   Principal Problem:   Symptomatic anemia Active Problems:   Breast cancer (HCC)   HTN (hypertension)   OSA on CPAP   Hypothyroidism   DM2 (diabetes mellitus, type 2) (HCC)   Hypoglycemia   Ribs, multiple fractures   Hiatal hernia    Anemia   1. Acute Metabolic Encephalopathy with Slurred Speech and word finding difficulties   Urinary tract infection: Seems to have word finding difficulties and slurred speech.  No other obvious neuro deficits on exam, but she's had falls over the past week.  She notes that this (speech trouble) started about 1 week ago, not clear how accurate this is (spoke to her sister in law who hasn't seen her in Diana [name] while, but said her housekeeper Diana [name], said things were ok on Monday - she noticed increased falls earlier this week, but did not notice any change in speech until yesterday).  Concerning for CVA, but w/u negative - UA concerning for UTI - will treat. 1. PT/OT/SLP -> recommending SNF/HH SLP 2. Echo as below, Carotid dopplers (1-39% stenosis bilaterally), MRI/Diana [name] brain without acute infarct - MRA degraded by motion. 3. Neurology c/s - recommending outpatient neurology follow up and treating UTI and avoiding sedating meds  2. Symptomatic anemia - seems to be most likely cause of her recent falls and weakness.  No obvous GI bleeding.  Negative FOBT. 1. Transfuse 2 units pRBC 2. Follow post transfusion H/H - Hb 7.5 today (downtrending) - will follow repeat today 3. Labs appear c/w iron def anemia and AOCD 4. GI c/s, appreciate recs - EGD with severe ulcerative esophagitis, GE junction stricture -> biopsied.  PPI BID indefinitely.  Feraheme, iron.  Needs f/u with GI in 6-8 weeks.  Discussed with Dr. Ardis [name] who notes  ok with starting anticoagulation in house (will follow repeat H/H and start with heparin to ensure she tolerates this)  3. Hypoglycemia   T2DM, now hyperglycemia - most likely 2/2 insulin with her amaryl.  Will hold both and follow BG q6.  1. Follow BG's - decrease lantus 5units nightly and SSI, continue to monitor. 2. Follow A1c 5.2 (though with anemia) and TSH (wnl).  Hold off on checking cortisol for now.  4. New onset Diana [name]. Fib - hold off on anticoagulation for now given severe anemia - with  concern for stroke above, will need to discuss with GI when ok for anticoagulation. 1. Follow echo (EF 60-65%, see below), TSH 2. Continue verapamil, uptitrate as needed  3. Appreciate cardiology c/s - verapamil decreased to 180, stop losartan   5. Left-sided rib fracture status post fall being followed by trauma surgery.  Pain control, IS, pulm toilet, PT.   6. Hypothyroidism on Synthroid check TSH (wnl).  PO synthroid.  7. Hypertension -verapamil and losartan   8. Abnormal left kidney lesion seen in the CAT scan which will need follow-up.  9. Large hiatal hernia seen in the CAT scan.  Will need follow-up.  10. Sleep apnea on CPAP.  11. History of breast cancer in remission.  12. Severe protein calorie malnutrition will need nutrition consult.   DVT prophylaxis: SCD Code Status: full  Family Communication: none at bedside Disposition Plan: pending further improvement - she'll need SNF   Consultants:   GI  Cardiology  Trauma surgery  Procedures:  Echo IMPRESSIONS    1. Left ventricular ejection fraction, by visual estimation, is 60 to 65%. The left ventricle has normal function. Normal left ventricular size. There is no left ventricular hypertrophy.  2. Left ventricular diastolic function could not be evaluated pattern of LV diastolic filling.  3. Global right ventricle has normal systolic function.The right ventricular size is not well visualized. No increase in right ventricular wall thickness.  4. Left atrial size was normal.  5. Right atrial size was not well visualized.  6. Moderate mitral annular calcification.  7. The mitral valve was not well visualized. Trace mitral valve regurgitation. No evidence of mitral stenosis.  8. The tricuspid valve is normal in structure. Tricuspid valve regurgitation was not visualized by color flow Doppler.  9. The aortic valve is normal in structure. Aortic valve regurgitation was not visualized by color flow Doppler.  Structurally normal aortic valve, with no evidence of sclerosis or stenosis. 10. The pulmonic valve was not well visualized. Pulmonic valve regurgitation is not visualized by color flow Doppler. 11. TR signal is inadequate for assessing pulmonary artery systolic pressure.  Antimicrobials:  Anti-infectives (From admission, onward)   None      Subjective: Doing ok today.  No complaints.   Objective: Vitals:   01/02/19 1200 01/02/19 1300 01/02/19 1400 01/02/19 1414  BP: (!) 142/83  (!) 123/59 (!) 123/54  Pulse: 71 67 79 75  Resp: 18 20 (!) 26 (!) 21  Temp:  98.4 F (36.9 C)    TempSrc:  Oral    SpO2: 96% 94% 93% 94%  Weight:      Height:        Intake/Output Summary (Last 24 hours) at 01/02/2019 1541 Last data filed at 01/02/2019 1100 Gross per 24 hour  Intake 300 ml  Output 1200 ml  Net -900 ml   Filed Weights   01/01/19 0818  Weight: 86.2 kg    Examination:  General: No acute distress.  Cardiovascular: Heart sounds show Imari Sivertsen regular rate, and rhythm. Lungs: Clear to auscultation bilaterally  Abdomen: Soft, nontender, nondistended  Neurological: Alert and oriented 3. Moves all extremities 4. Cranial nerves II through XII grossly intact. Skin: Warm and dry. No rashes or lesions. Extremities: No clubbing or cyanosis. No edema. .   Data Reviewed: I have personally reviewed following labs and imaging studies  CBC: Recent Labs  Lab 12/30/18 2246 12/31/18 1844 01/01/19 0319 01/02/19 0350  WBC 6.7 8.8 7.7 9.2  NEUTROABS 5.4  --   --   --   HGB 5.2* 8.8* 8.1* 7.5*  HCT 20.0* 30.4* 26.8* 25.4*  MCV 62.9* 69.7* 68.9* 70.4*  PLT 402* 379 321 [id number]   Basic Metabolic Panel: Recent Labs  Lab 12/30/18 2246 12/31/18 1844 01/01/19 0319 01/02/19 0350  NA 131* 133* 131* 133*  K 3.7 3.8 4.2 3.9  CL 99 100 102 103  CO2 21* 22 19* 23  GLUCOSE 130* 209* 230* 95  BUN 16 19 19 19   CREATININE 1.17* 1.43* 1.32* 1.23*  CALCIUM 8.0* 7.9* 7.8* 8.1*  MG  --   --  1.8 1.9    GFR: Estimated Creatinine Clearance: 39.8 mL/min (Coby Antrobus) (by C-G formula based on SCr of 1.23 mg/dL (H)). Liver Function Tests: Recent Labs  Lab 12/30/18 2246 12/31/18 1844 01/01/19 0319 01/02/19 0350  AST 25 18 21 19   ALT 19 18 16 16   ALKPHOS 70 74 63 57  BILITOT 1.4* 1.7* 1.2 0.8  PROT 5.6* 5.6* 5.0* 4.9*  ALBUMIN 2.7* 2.6* 2.3* 2.2*   No results for input(s): LIPASE, AMYLASE in the last 168 hours. No results for input(s): AMMONIA in the last 168 hours. Coagulation Profile: No results for input(s): INR, PROTIME in the last 168 hours. Cardiac Enzymes: No results for input(s): CKTOTAL, CKMB, CKMBINDEX, TROPONINI in the last 168 hours. BNP (last 3 results) No results for input(s): PROBNP in the last 8760 hours. HbA1C: Recent Labs    12/31/18 1844  HGBA1C 5.2   CBG: Recent Labs  Lab 01/01/19 1723 01/01/19 2211 01/02/19 0037 01/02/19 0618 01/02/19 1155  GLUCAP 183* 212* 121* 102* 94   Lipid Profile: No results for input(s): CHOL, HDL, LDLCALC, TRIG, CHOLHDL, LDLDIRECT in the last 72 hours. Thyroid Function Tests: Recent Labs    12/31/18 1844  TSH 4.185   Anemia Panel: Recent Labs    12/31/18 1844  VITAMINB12 1,040*  FOLATE 10.4  FERRITIN 14  TIBC 325  IRON 42  RETICCTPCT 1.0   Sepsis Labs: No results for input(s): PROCALCITON, LATICACIDVEN in the last 168 hours.  Recent Results (from the past 240 hour(s))  SARS CORONAVIRUS 2 (TAT 6-24 HRS) Nasopharyngeal Nasopharyngeal Swab     Status: None   Collection Time: 12/30/18 11:32 PM   Specimen: Nasopharyngeal Swab  Result Value Ref Range Status   SARS Coronavirus 2 NEGATIVE NEGATIVE Final    Comment: (NOTE) SARS-CoV-2 target nucleic acids are NOT DETECTED. The SARS-CoV-2 RNA is generally detectable in upper and lower respiratory specimens during the acute phase of infection. Negative results do not preclude SARS-CoV-2 infection, do not rule out co-infections with other pathogens, and should not be used  as the sole basis for treatment or other patient management decisions. Negative results must be combined with clinical observations, patient history, and epidemiological information. The expected result is Negative. Fact Sheet for Patients: SugarRoll.be Fact Sheet for Healthcare Providers: https://www.woods-mathews.com/ This test is not yet approved or cleared by the [place] FDA and  has  been authorized for detection and/or diagnosis of SARS-CoV-2 by FDA under an Emergency Use Authorization (EUA). This EUA will remain  in effect (meaning this test can be used) for the duration of the COVID-19 declaration under Section 56 4(b)(1) of the Act, 21 U.S.C. section 360bbb-3(b)(1), unless the authorization is terminated or revoked sooner. Performed at [facility], [place] [address] [place], [place] [address]          Radiology Studies: Mr Angio Head Wo Contrast  Result Date: 01/01/2019 CLINICAL DATA:  Focal neuro deficit. Diabetes and hypertension. Breast cancer. Weakness. Recent fall. EXAM: MRI HEAD WITHOUT CONTRAST MRA HEAD WITHOUT CONTRAST TECHNIQUE: Multiplanar, multiecho pulse sequences of the brain and surrounding structures were obtained without intravenous contrast. Angiographic images of the head were obtained using MRA technique without contrast. COMPARISON:  CT head 12/31/2018 FINDINGS: MRI HEAD FINDINGS Brain: Moderate atrophy. Moderate chronic microvascular ischemic changes in the white matter. Negative for acute infarct.  Negative for hemorrhage or mass. Vascular: Normal arterial flow voids with exception of distal right vertebral artery which is not visualized. This could be congenital or acquired. Skull and upper cervical spine: No focal skeletal lesion Sinuses/Orbits: Mild mucosal edema paranasal sinuses. Large right mastoid effusion. Small left mastoid effusion. Bilateral cataract surgery. Other: Motion degraded study MRA HEAD  FINDINGS Image quality degraded by extensive motion. Limited information is available on the study. Left vertebral artery supplies the basilar. Right vertebral artery not visualized. Basilar is patent. Posterior cerebral arteries are patent but irregular and likely contain atherosclerotic disease Extensive irregularity in the internal carotid artery bilaterally in the precavernous and cavernous segment. Extensive irregularity in the anterior middle cerebral arteries. Suspect underlying atherosclerotic disease however there is considerable artifact. IMPRESSION: 1. No acute infarct. Atrophy and moderate chronic microvascular ischemia. 2. MRA is degraded by motion with little diagnostic information. Suspect underlying intracranial atherosclerotic disease which could be severe. Electronically Signed   By: Franchot Gallo M.D.   On: 01/01/2019 13:57   Mr Brain Wo Contrast  Result Date: 01/01/2019 CLINICAL DATA:  Focal neuro deficit. Diabetes and hypertension. Breast cancer. Weakness. Recent fall. EXAM: MRI HEAD WITHOUT CONTRAST MRA HEAD WITHOUT CONTRAST TECHNIQUE: Multiplanar, multiecho pulse sequences of the brain and surrounding structures were obtained without intravenous contrast. Angiographic images of the head were obtained using MRA technique without contrast. COMPARISON:  CT head 12/31/2018 FINDINGS: MRI HEAD FINDINGS Brain: Moderate atrophy. Moderate chronic microvascular ischemic changes in the white matter. Negative for acute infarct.  Negative for hemorrhage or mass. Vascular: Normal arterial flow voids with exception of distal right vertebral artery which is not visualized. This could be congenital or acquired. Skull and upper cervical spine: No focal skeletal lesion Sinuses/Orbits: Mild mucosal edema paranasal sinuses. Large right mastoid effusion. Small left mastoid effusion. Bilateral cataract surgery. Other: Motion degraded study MRA HEAD FINDINGS Image quality degraded by extensive motion. Limited  information is available on the study. Left vertebral artery supplies the basilar. Right vertebral artery not visualized. Basilar is patent. Posterior cerebral arteries are patent but irregular and likely contain atherosclerotic disease Extensive irregularity in the internal carotid artery bilaterally in the precavernous and cavernous segment. Extensive irregularity in the anterior middle cerebral arteries. Suspect underlying atherosclerotic disease however there is considerable artifact. IMPRESSION: 1. No acute infarct. Atrophy and moderate chronic microvascular ischemia. 2. MRA is degraded by motion with little diagnostic information. Suspect underlying intracranial atherosclerotic disease which could be severe. Electronically Signed   By: Franchot Gallo M.D.   On:  01/01/2019 13:57   Vas US Carotid  Result Date: 01/01/2019 Carotid Arterial Duplex Study Indications:  Stroke like symptoms. Risk Factors: Hypertension, hyperlipidemia, Diabetes. Performing Technologist: Abram Sander RVS  Examination Guidelines: Dovey Fatzinger complete evaluation includes B-mode imaging, spectral Doppler, color Doppler, and power Doppler as needed of all accessible portions of each vessel. Bilateral testing is considered an integral part of Jaila Schellhorn complete examination. Limited examinations for reoccurring indications may be performed as noted.  Right Carotid Findings: +----------+--------+--------+--------+------------------+--------+             PSV cm/s EDV cm/s Stenosis Plaque Description Comments  +----------+--------+--------+--------+------------------+--------+  CCA Prox   60       6                 heterogenous                 +----------+--------+--------+--------+------------------+--------+  CCA Distal 53       7                 heterogenous                 +----------+--------+--------+--------+------------------+--------+  ICA Prox   40       8        1-39%    heterogenous                  +----------+--------+--------+--------+------------------+--------+  ICA Distal 59       14                                             +----------+--------+--------+--------+------------------+--------+  ECA        54                                                      +----------+--------+--------+--------+------------------+--------+ +----------+--------+-------+--------+-------------------+             PSV cm/s EDV cms Describe Arm Pressure (mmHG)  +----------+--------+-------+--------+-------------------+  Subclavian 74                                             +----------+--------+-------+--------+-------------------+ +---------+--------+--+--------+--+---------+  Vertebral PSV cm/s 61 EDV cm/s 14 Antegrade  +---------+--------+--+--------+--+---------+  Left Carotid Findings: +----------+--------+--------+--------+------------------+--------+             PSV cm/s EDV cm/s Stenosis Plaque Description Comments  +----------+--------+--------+--------+------------------+--------+  CCA Prox   83       18                heterogenous                 +----------+--------+--------+--------+------------------+--------+  CCA Distal 45       10                heterogenous                 +----------+--------+--------+--------+------------------+--------+  ICA Prox   47       9        1-39%    heterogenous                 +----------+--------+--------+--------+------------------+--------+  ICA Distal 59       19                                             +----------+--------+--------+--------+------------------+--------+  ECA        46       6                                              +----------+--------+--------+--------+------------------+--------+ +----------+--------+--------+--------+-------------------+             PSV cm/s EDV cm/s Describe Arm Pressure (mmHG)  +----------+--------+--------+--------+-------------------+  Subclavian 63                                               +----------+--------+--------+--------+-------------------+ +---------+--------+--+--------+--+---------+  Vertebral PSV cm/s 49 EDV cm/s 10 Antegrade  +---------+--------+--+--------+--+---------+  Summary: Right Carotid: Velocities in the right ICA are consistent with Jullia Mulligan 1-39% stenosis. Left Carotid: Velocities in the left ICA are consistent with Gladys Deckard 1-39% stenosis. Vertebrals: Bilateral vertebral arteries demonstrate antegrade flow. *See table(s) above for measurements and observations.  Electronically signed by Monica Martinez MD on 01/01/2019 at 6:37:19 PM.    Final         Scheduled Meds:  sodium chloride   Intravenous Once   brimonidine  1 drop Both Eyes BID   And   timolol  1 drop Both Eyes BID   feeding supplement (ENSURE ENLIVE)  237 mL Oral BID BM   FLUoxetine  20 mg Oral Daily   insulin aspart  0-5 Units Subcutaneous QHS   insulin aspart  0-9 Units Subcutaneous TID WC   insulin glargine  10 Units Subcutaneous QHS   levothyroxine  50 mcg Oral Daily   multivitamin with minerals  1 tablet Oral Daily   pantoprazole  40 mg Oral BID AC   polyethylene glycol  17 g Oral Daily   rosuvastatin  20 mg Oral Daily   verapamil  180 mg Oral QHS   Continuous Infusions:   LOS: 1 day    Time spent: over 30 min    Fayrene Helper, MD [facility] Pager [email or web address]  If 7PM-7AM, please contact night-coverage [email or web address] Password [email or web address] 01/02/2019, 3:41 PM

## 2019-01-02 NOTE — Evaluation (Signed)
Speech Language Pathology Evaluation Patient Details Name: Diana [name] MRN: [id number] DOB: [birth year] Today's Date: 01/02/2019 Time: [id number] SLP Time Calculation (min) (ACUTE ONLY): 41 min  Problem List:  Patient Active Problem List   Diagnosis Date Noted  . Anemia 01/01/2019  . Hiatal hernia   . Symptomatic anemia 12/31/2018  . Hypoglycemia 12/31/2018  . Ribs, multiple fractures 12/31/2018  . HTN (hypertension) 05/15/2013  . OSA on CPAP 05/15/2013  . Hypothyroidism 05/15/2013  . DM2 (diabetes mellitus, type 2) ([place]) 05/15/2013  . Lower extremity edema 05/15/2013  . Renal insufficiency 05/15/2013  . Morbid obesity ([place]) 05/15/2013  . Breast cancer ([place]) 03/18/2011   Past Medical History:  Past Medical History:  Diagnosis Date  . Anemia   . Anemia of decreased vitamin B12 absorption 05/26/2005  . Arthritis   . Breast cancer ([place]) 2001  . Cataract   . History of tobacco abuse   . Hx of colonic polyps   . Hyperlipidemia   . Hypertension   . Hypothyroidism   . Obesity   . OSA on CPAP 09/2007   AHI 10.6/hr overall, 43.64/hr during REM  . Osteopenia   . Renal insufficiency   . Type 2 diabetes mellitus ([place])    Past Surgical History:  Past Surgical History:  Procedure Laterality Date  . BACK SURGERY  05/30/2009  . BOWEL RESECTION  1974  . CATARACT EXTRACTION Bilateral    bilateral  . CHOLECYSTECTOMY  1974  . MASTECTOMY Bilateral 2001   bilateral sentinel lymph nodes bio  . NM MYOCAR PERF WALL MOTION  06/2007   dipyridamole; perfusion defect in inferior myocardium consistent with diaphragmatic attenuation, remaining myocardium with no ischemia/infarct, EF 73%; normal, low risk scan   . TOTAL ABDOMINAL HYSTERECTOMY  1975  . TRANSTHORACIC ECHOCARDIOGRAM  02/2010   [id number], stage 1 diastolic dysfunction; borderline RV enlargement; LA mild-mod dilated; mild mitral annular calcif & mild MR; mild TR with normal RSVP, AV moderately sclerotics   HPI:  85 y.o.  female admitted on 12/30/18 for fall with symptomatic anemia s/p 2 units PRBCs, hypoglycemia, A-fib, and rib fractures L anterior 6-9.  Pt with other significant PMH of DM2, renal insufficiency, obesity, HTN, breast CA, anemia (due to poor B12 absorption), back surgery, bowel resection, bil mastectomy.     Assessment / Plan / Recommendation Clinical Impression  Pt was seen for a cognitive-linguistic evaluation in the setting of acute speech changes in the past few days.  Pt reported that she lives alone and is wheelchair bound at baseline; however, she completes most IADLs independently including managing her medications and finances.  She stated that a caregiver comes 2 days each week to bring groceries, prepare food, clean the house, and to transport her to any appointments.  Pt reported changes in speech beginning approximately 2 days ago and stated that she noticed a change in her ability to write approximately 1-2 weeks ago.  Pt was unable to legibly write her name or short words despite being given the word to copy; however, she was able to verbally spell the words without difficulty.  Pt exhibited slow, dysarthric speech that was approximately 75-80% intelligible to an unfamiliar listener at the word, sentence, and conversation level.  Pt additionally exhibited delayed processing, otherwise expressive/receptive language abilities were functional.  Pt was observed to have mild cognitive deficits in the areas of short-term memory and executive functioning with the greatest deficit in attention.  Pt benefited from verbal cues to re-direct attention to task throughout this  evaluation.     Recommend home health ST targeting cognitive-linguistic deficits and assistance with IADLs at time of discharge.      SLP Assessment  SLP Recommendation/Assessment: Patient needs continued Speech Lanaguage Pathology Services SLP Visit Diagnosis: Cognitive communication deficit (R41.841);Dysarthria and anarthria (R47.1)     Follow Up Recommendations  Home health SLP    Frequency and Duration min 2x/week  2 weeks      SLP Evaluation Cognition  Arousal/Alertness: Awake/alert Orientation Level: Oriented X4 Attention: Sustained Sustained Attention: Impaired Sustained Attention Impairment: Verbal complex Memory: Impaired Memory Impairment: Retrieval deficit Immediate Memory Recall: Sock;Blue;Bed Memory Recall Sock: With Cue Memory Recall Blue: Without Cue Memory Recall Bed: Without Cue Awareness: Appears intact Problem Solving: Impaired Problem Solving Impairment: Functional complex;Verbal complex Executive Function: Reasoning;Sequencing Reasoning: Appears intact Sequencing: Impaired Sequencing Impairment: Verbal complex Safety/Judgment: Impaired Comments: Mild impairment        Comprehension  Auditory Comprehension Yes/No Questions: Within Functional Limits Commands: Within Functional Limits Conversation: Complex Other Conversation Comments: delayed response  Interfering Components: Processing speed EffectiveTechniques: Extra processing time    Expression Expression Primary Mode of Expression: Verbal Verbal Expression Automatic Speech: Counting Level of Generative/Spontaneous Verbalization: Conversation Repetition: No impairment Naming: No impairment Interfering Components: Speech intelligibility Written Expression Dominant Hand: Right Written Expression: Exceptions to [facility] Copy Ability: Word Self Formulation Ability: Word   Oral / Motor  Oral Motor/Sensory Function Overall Oral Motor/Sensory Function: Within functional limits Motor Speech Overall Motor Speech: Impaired Respiration: Within functional limits Phonation: Normal Articulation: Impaired Level of Impairment: Word Intelligibility: Intelligibility reduced Word: 75-100% accurate Phrase: 75-100% accurate Sentence: 75-100% accurate Conversation: 75-100% accurate Motor Planning: Impaired Level of Impairment:  Word Motor Speech Errors: Unaware Effective Techniques: Over-articulate   GO                    [name] Stanely Sexson 01/02/2019, 9:05 AM

## 2019-01-02 NOTE — Progress Notes (Signed)
No urine out put in 12 hours.  Bladder scan done, 300 cc recorded.  Page to Dr, Florene Glen, orders received.

## 2019-01-02 NOTE — Progress Notes (Signed)
ANTICOAGULATION CONSULT NOTE - Initial Consult  Pharmacy Consult for heparin  Indication: atrial fibrillation  Allergies  Allergen Reactions  . Codeine Sulfate Nausea Only    Patient Measurements: Height: 5\' 8"  (172.7 cm) Weight: 190 lb (86.2 kg) IBW/kg (Calculated) : 63.9 Heparin Dosing Weight: 81kg  Vital Signs: Temp: 98.4 F (36.9 C) (10/25 1300) Temp Source: Oral (10/25 1300) BP: 123/54 (10/25 1414) Pulse Rate: 75 (10/25 1414)  Labs: Recent Labs    12/31/18 1844 01/01/19 0319 01/02/19 0350 01/02/19 1644  HGB 8.8* 8.1* 7.5* 7.5*  HCT 30.4* 26.8* 25.4* 25.2*  PLT 379 321 336  --   CREATININE 1.43* 1.32* 1.23*  --     Estimated Creatinine Clearance: 39.8 mL/min (A) (by C-G formula based on SCr of 1.23 mg/dL (H)).   Medical History: Past Medical History:  Diagnosis Date  . Anemia   . Anemia of decreased vitamin B12 absorption 05/26/2005  . Arthritis   . Breast cancer ([place]) 2001  . Cataract   . History of tobacco abuse   . Hx of colonic polyps   . Hyperlipidemia   . Hypertension   . Hypothyroidism   . Obesity   . OSA on CPAP 09/2007   AHI 10.6/hr overall, 43.64/hr during REM  . Osteopenia   . Renal insufficiency   . Type 2 diabetes mellitus ([facility])     Medications:  Medications Prior to Admission  Medication Sig Dispense Refill Last Dose  . Apoaequorin (PREVAGEN PO) Take 1 capsule by mouth daily.   12/29/2018  . aspirin 81 MG tablet Take 81 mg by mouth 2 (two) times daily.    12/29/2018  . COMBIGAN 0.2-0.5 % ophthalmic solution Place 1 drop into both eyes every 12 (twelve) hours.    Past Month at Unknown time  . FLUoxetine (PROZAC) 20 MG capsule Take 20 mg by mouth daily.    12/29/2018  . glimepiride (AMARYL) 2 MG tablet Take 2 mg by mouth daily.    12/29/2018  . insulin glargine (LANTUS) 100 UNIT/ML injection Inject 20 Units into the skin at bedtime. Sliding scale   12/29/2018  . levothyroxine (SYNTHROID, LEVOTHROID) 50 MCG tablet Take 50 mcg by mouth  daily.     12/29/2018 at Unknown time  . losartan (COZAAR) 100 MG tablet Take 1 tablet (100 mg total) by mouth daily. NEEDS APPOINTMENT FOR FUTURE REFILLS (Patient taking differently: Take 100 mg by mouth daily. ) 30 tablet 0 12/29/2018  . Multiple Vitamins-Minerals (PRESERVISION AREDS 2) CAPS Take 2 capsules by mouth daily.   12/29/2018  . rosuvastatin (CRESTOR) 20 MG tablet Take 20 mg by mouth daily.   12/29/2018  . traMADol (ULTRAM) 50 MG tablet Take 1 tablet (50 mg total) by mouth every 6 (six) hours as needed for moderate pain. (Patient taking differently: Take 50 mg by mouth 3 (three) times daily as needed for moderate pain. ) 20 tablet 0 Past Month at Unknown time  . verapamil (CALAN-SR) 240 MG CR tablet Take 1 tablet (240 mg total) by mouth at bedtime. Please keep upcoming appt for further refills. 90 tablet 0 12/29/2018   Scheduled:  . sodium chloride   Intravenous Once  . brimonidine  1 drop Both Eyes BID   And  . timolol  1 drop Both Eyes BID  . feeding supplement (ENSURE ENLIVE)  237 mL Oral BID BM  . FLUoxetine  20 mg Oral Daily  . insulin aspart  0-5 Units Subcutaneous QHS  . insulin aspart  0-9 Units  Subcutaneous TID WC  . insulin glargine  5 Units Subcutaneous QHS  . levothyroxine  50 mcg Oral Daily  . multivitamin with minerals  1 tablet Oral Daily  . pantoprazole  40 mg Oral BID AC  . polyethylene glycol  17 g Oral Daily  . rosuvastatin  20 mg Oral Daily  . verapamil  180 mg Oral QHS    Assessment: 85 yo female with new onset afib (CHADSVASC= 4 ) and pharmacy consulted to dose heparin. GI was seeing for anemia and she is now s/p EGD with severe ulcerative esophagitis and biopsy on 10/24 -hg= 7.5 (noted 5.2 on 10/22)  Goal of Therapy:  Heparin level 0.3-0.7 units/ml Monitor platelets by anticoagulation protocol: Yes   Plan:  -No heparin bolus per MD request (recent anemia) -Begin heparin infusion at 1000 units/hr -Heparin level in 8 hours and daily wth CBC  daily  Hildred Laser, PharmD Clinical Pharmacist **Pharmacist phone directory can now be found on [email or web address] ([email or web address]).  Listed under [place].

## 2019-01-02 NOTE — Plan of Care (Signed)

## 2019-01-02 NOTE — Progress Notes (Signed)
  Speech Language Pathology Treatment: Dysphagia  Patient Details Name: Diana [name] MRN: [id number] DOB: 1933-04-27 Today's Date: 01/02/2019 Time: 9144-4584 SLP Time Calculation (min) (ACUTE ONLY): 9 min  Assessment / Plan / Recommendation Clinical Impression  Pt was encountered awake/alert sitting upright in bed and she was agreeable to ST tx session.  Pt was observed with trials of thin liquid via straw sip, regular solids, and whole medication with thin liquid administered by RN.  Pt was re-educated regarding safe swallow strategies and she verbalized understanding with teach back and she demonstrated understanding during po trials.  Pt exhibited timely mastication and AP transport of regular solids with trace oral residue that she was sensate to and cleared independently with a liquid wash.  She exhibited eructation x2 following po trials; however, no clinical s/sx of aspiration or difficulty were observed with any trials.  Recommend continuation of regular solids and thin liquid with intermittent supervision to cue for compensatory strategies and reflux precautions.  Pt has met her goals for dysphagia at this time; however, ST will continue to follow up for cognitive-linguistic treatment per POC.    HPI HPI: 85 y.o. female admitted on 12/30/18 for fall with symptomatic anemia s/p 2 units PRBCs, hypoglycemia, A-fib, and rib fractures L anterior 6-9.  Pt with other significant PMH of DM2, renal insufficiency, obesity, HTN, breast CA, anemia (due to poor B12 absorption), back surgery, bowel resection, bil mastectomy.        SLP Plan  Continue with current plan of care  Patient needs continued Speech Lanaguage Pathology Services    Recommendations  Diet recommendations: Regular;Thin liquid Liquids provided via: Cup;Straw Medication Administration: Whole meds with liquid Supervision: Intermittent supervision to cue for compensatory strategies;Patient able to self feed Compensations:  Minimize environmental distractions;Slow rate;Small sips/bites Postural Changes and/or Swallow Maneuvers: Seated upright 90 degrees;Upright 30-60 min after meal                Follow up Recommendations: Home health SLP SLP Visit Diagnosis: Dysphagia, unspecified (R13.10) Plan: Continue with current plan of care       Bretta Bang, M.S., [place] Office: [phone]               [place] 01/02/2019, 9:20 AM

## 2019-01-02 NOTE — Progress Notes (Addendum)
Progress Note  Patient Name: Diana [name] Date of Encounter: 01/02/2019  Primary Cardiologist:   Subjective   Pt denies CP  NoSOB in bed   Inpatient Medications    Scheduled Meds:  sodium chloride   Intravenous Once   brimonidine  1 drop Both Eyes BID   And   timolol  1 drop Both Eyes BID   feeding supplement (ENSURE ENLIVE)  237 mL Oral BID BM   FLUoxetine  20 mg Oral Daily   insulin aspart  0-5 Units Subcutaneous QHS   insulin aspart  0-9 Units Subcutaneous TID WC   insulin glargine  10 Units Subcutaneous QHS   levothyroxine  50 mcg Oral Daily   losartan  100 mg Oral Daily   multivitamin with minerals  1 tablet Oral Daily   pantoprazole  40 mg Oral BID AC   polyethylene glycol  17 g Oral Daily   rosuvastatin  20 mg Oral Daily   verapamil  240 mg Oral QHS   Continuous Infusions:  PRN Meds: acetaminophen **OR** acetaminophen, alum & mag hydroxide-simeth, ondansetron **OR** ondansetron (ZOFRAN) IV   Vital Signs    Vitals:   01/01/19 1730 01/01/19 2000 01/02/19 0000 01/02/19 0400  BP: (!) 141/70 112/80 (!) 118/99 103/64  Pulse: 70 72 73 (!) 51  Resp: (!) 28 (!) 22 (!) 23 (!) 22  Temp: 97.7 F (36.5 C) 98 F (36.7 C)  97.6 F (36.4 C)  TempSrc: Oral Oral  Axillary  SpO2: 96% 95% 97% 96%  Weight:      Height:        Intake/Output Summary (Last 24 hours) at 01/02/2019 0731 Last data filed at 01/02/2019 0630 Gross per 24 hour  Intake 247.4 ml  Output 1200 ml  Net -952.6 ml   Last 3 Weights 01/01/2019 12/06/2017 12/04/2016  Weight (lbs) 190 lb 225 lb 235 lb  Weight (kg) 86.183 kg 102.059 kg 106.595 kg      Telemetry    Atrial fib   50s to 60s   Personally Reviewed  ECG    Physical Exam   GEN: No acute distress.   Neck: No JVD Cardiac: Irreg irreg  no murmurs, rubs, or gallops.  Respiratory: Clear to auscultation bilaterally. GI: Soft, nontender, non-distended  MS: No edema; No deformity. Psych: Normal affect   Labs    High Sensitivity Troponin:  No results for input(s): TROPONINIHS in the last 720 hours.    Chemistry Recent Labs  Lab 12/31/18 1844 01/01/19 0319 01/02/19 0350  NA 133* 131* 133*  K 3.8 4.2 3.9  CL 100 102 103  CO2 22 19* 23  GLUCOSE 209* 230* 95  BUN 19 19 19   CREATININE 1.43* 1.32* 1.23*  CALCIUM 7.9* 7.8* 8.1*  PROT 5.6* 5.0* 4.9*  ALBUMIN 2.6* 2.3* 2.2*  AST 18 21 19   ALT 18 16 16   ALKPHOS 74 63 57  BILITOT 1.7* 1.2 0.8  GFRNONAA 34* 37* 41*  GFRAA 39* 43* 47*  ANIONGAP 11 10 7      Hematology Recent Labs  Lab 12/31/18 1844 01/01/19 0319 01/02/19 0350  WBC 8.8 7.7 9.2  RBC 4.36   4.33 3.89 3.61*  HGB 8.8* 8.1* 7.5*  HCT 30.4* 26.8* 25.4*  MCV 69.7* 68.9* 70.4*  MCH 20.2* 20.8* 20.8*  MCHC 28.9* 30.2 29.5*  RDW 25.7* 25.8* 26.0*  PLT 379 321 336    BNP Recent Labs  Lab 12/31/18 1844  BNP 613.3*     DDimer No  results for input(s): DDIMER in the last 168 hours.   Radiology    Mr Angio Head Wo Contrast  Result Date: 01/01/2019 CLINICAL DATA:  Focal neuro deficit. Diabetes and hypertension. Breast cancer. Weakness. Recent fall. EXAM: MRI HEAD WITHOUT CONTRAST MRA HEAD WITHOUT CONTRAST TECHNIQUE: Multiplanar, multiecho pulse sequences of the brain and surrounding structures were obtained without intravenous contrast. Angiographic images of the head were obtained using MRA technique without contrast. COMPARISON:  CT head 12/31/2018 FINDINGS: MRI HEAD FINDINGS Brain: Moderate atrophy. Moderate chronic microvascular ischemic changes in the white matter. Negative for acute infarct.  Negative for hemorrhage or mass. Vascular: Normal arterial flow voids with exception of distal right vertebral artery which is not visualized. This could be congenital or acquired. Skull and upper cervical spine: No focal skeletal lesion Sinuses/Orbits: Mild mucosal edema paranasal sinuses. Large right mastoid effusion. Small left mastoid effusion. Bilateral cataract surgery. Other:  Motion degraded study MRA HEAD FINDINGS Image quality degraded by extensive motion. Limited information is available on the study. Left vertebral artery supplies the basilar. Right vertebral artery not visualized. Basilar is patent. Posterior cerebral arteries are patent but irregular and likely contain atherosclerotic disease Extensive irregularity in the internal carotid artery bilaterally in the precavernous and cavernous segment. Extensive irregularity in the anterior middle cerebral arteries. Suspect underlying atherosclerotic disease however there is considerable artifact. IMPRESSION: 1. No acute infarct. Atrophy and moderate chronic microvascular ischemia. 2. MRA is degraded by motion with little diagnostic information. Suspect underlying intracranial atherosclerotic disease which could be severe. Electronically Signed   By: Franchot Gallo M.D.   On: 01/01/2019 13:57   Mr Brain Wo Contrast  Result Date: 01/01/2019 CLINICAL DATA:  Focal neuro deficit. Diabetes and hypertension. Breast cancer. Weakness. Recent fall. EXAM: MRI HEAD WITHOUT CONTRAST MRA HEAD WITHOUT CONTRAST TECHNIQUE: Multiplanar, multiecho pulse sequences of the brain and surrounding structures were obtained without intravenous contrast. Angiographic images of the head were obtained using MRA technique without contrast. COMPARISON:  CT head 12/31/2018 FINDINGS: MRI HEAD FINDINGS Brain: Moderate atrophy. Moderate chronic microvascular ischemic changes in the white matter. Negative for acute infarct.  Negative for hemorrhage or mass. Vascular: Normal arterial flow voids with exception of distal right vertebral artery which is not visualized. This could be congenital or acquired. Skull and upper cervical spine: No focal skeletal lesion Sinuses/Orbits: Mild mucosal edema paranasal sinuses. Large right mastoid effusion. Small left mastoid effusion. Bilateral cataract surgery. Other: Motion degraded study MRA HEAD FINDINGS Image quality degraded  by extensive motion. Limited information is available on the study. Left vertebral artery supplies the basilar. Right vertebral artery not visualized. Basilar is patent. Posterior cerebral arteries are patent but irregular and likely contain atherosclerotic disease Extensive irregularity in the internal carotid artery bilaterally in the precavernous and cavernous segment. Extensive irregularity in the anterior middle cerebral arteries. Suspect underlying atherosclerotic disease however there is considerable artifact. IMPRESSION: 1. No acute infarct. Atrophy and moderate chronic microvascular ischemia. 2. MRA is degraded by motion with little diagnostic information. Suspect underlying intracranial atherosclerotic disease which could be severe. Electronically Signed   By: Franchot Gallo M.D.   On: 01/01/2019 13:57   Vas US Carotid  Result Date: 01/01/2019 Carotid Arterial Duplex Study Indications:  Stroke like symptoms. Risk Factors: Hypertension, hyperlipidemia, Diabetes. Performing Technologist: Abram Sander RVS  Examination Guidelines: A complete evaluation includes B-mode imaging, spectral Doppler, color Doppler, and power Doppler as needed of all accessible portions of each vessel. Bilateral testing is considered an integral part of a  complete examination. Limited examinations for reoccurring indications may be performed as noted.  Right Carotid Findings: +----------+--------+--------+--------+------------------+--------+             PSV cm/s EDV cm/s Stenosis Plaque Description Comments  +----------+--------+--------+--------+------------------+--------+  CCA Prox   60       6                 heterogenous                 +----------+--------+--------+--------+------------------+--------+  CCA Distal 53       7                 heterogenous                 +----------+--------+--------+--------+------------------+--------+  ICA Prox   40       8        1-39%    heterogenous                  +----------+--------+--------+--------+------------------+--------+  ICA Distal 59       14                                             +----------+--------+--------+--------+------------------+--------+  ECA        54                                                      +----------+--------+--------+--------+------------------+--------+ +----------+--------+-------+--------+-------------------+             PSV cm/s EDV cms Describe Arm Pressure (mmHG)  +----------+--------+-------+--------+-------------------+  Subclavian 74                                             +----------+--------+-------+--------+-------------------+ +---------+--------+--+--------+--+---------+  Vertebral PSV cm/s 61 EDV cm/s 14 Antegrade  +---------+--------+--+--------+--+---------+  Left Carotid Findings: +----------+--------+--------+--------+------------------+--------+             PSV cm/s EDV cm/s Stenosis Plaque Description Comments  +----------+--------+--------+--------+------------------+--------+  CCA Prox   83       18                heterogenous                 +----------+--------+--------+--------+------------------+--------+  CCA Distal 45       10                heterogenous                 +----------+--------+--------+--------+------------------+--------+  ICA Prox   47       9        1-39%    heterogenous                 +----------+--------+--------+--------+------------------+--------+  ICA Distal 59       19                                             +----------+--------+--------+--------+------------------+--------+  ECA  46       6                                              +----------+--------+--------+--------+------------------+--------+ +----------+--------+--------+--------+-------------------+             PSV cm/s EDV cm/s Describe Arm Pressure (mmHG)  +----------+--------+--------+--------+-------------------+  Subclavian 63                                               +----------+--------+--------+--------+-------------------+ +---------+--------+--+--------+--+---------+  Vertebral PSV cm/s 49 EDV cm/s 10 Antegrade  +---------+--------+--+--------+--+---------+  Summary: Right Carotid: Velocities in the right ICA are consistent with a 1-39% stenosis. Left Carotid: Velocities in the left ICA are consistent with a 1-39% stenosis. Vertebrals: Bilateral vertebral arteries demonstrate antegrade flow. *See table(s) above for measurements and observations.  Electronically signed by Monica Martinez MD on 01/01/2019 at 6:37:19 PM.    Final     Cardiac Studies  1. Left ventricular ejection fraction, by visual estimation, is 60 to 65%. The left ventricle has normal function. Normal left ventricular size. There is no left ventricular hypertrophy. 2. Left ventricular diastolic function could not be evaluated pattern of LV diastolic filling. 3. Global right ventricle has normal systolic function.The right ventricular size is not well visualized. No increase in right ventricular wall thickness. 4. Left atrial size was normal. 5. Right atrial size was not well visualized. 6. Moderate mitral annular calcification. 7. The mitral valve was not well visualized. Trace mitral valve regurgitation. No evidence of mitral stenosis. 8. The tricuspid valve is normal in structure. Tricuspid valve regurgitation was not visualized by color flow Doppler. 9. The aortic valve is normal in structure. Aortic valve regurgitation was not visualized by color flow Doppler. Structurally normal aortic valve, with no evidence of sclerosis or stenosis. 10. The pulmonic valve was not well visualized. Pulmonic valve regurgitation is not visualized by color flow Doppler. 11. TR signal is inadequate for assessing pulmonary artery systolic pressure.  CAROTID USN:   Mild plaquing bilaterally  Patient Profile      Diana [name] is a 85 y.o. female with a hx of HTN, HLD, IDDM, Hypothyroidism,  CKD, Obesity and OSA who is being seen today for the evaluation of Atrial Fibrillation at the request of Dr.Powell.   Assessment & Plan    1 Atrail fibrillation  Rates controlled   Pt with CHADSVASc of 4   Should be on anticoagulation if able   Hgb this AM 7.5    Will hold   This will need to be followed    Would also like GI input re anticoagulation  On 240 Verapamil   Got it last night  WIll decrease to 180   Follow HR esp with ambulation  2  GI  Endoscopy showed severe esophagitis     3  ANemia  Hgb is 7.5 this am  (best was 8.8 after transfusion)  Received feraheme.   WIll need t obe followed closely    4  HL  ON lipitor  5  HTN   On Losartan  Did not get yesterday   Cut back on verapamil to 180   Follow closely  Stop losartan    6  NSVT  Pt with 11 beat  NSVT yesterday at 1 PM   FOllow on tele  I would not change anything for now   Pt's volume status appears OK   Will need close f/u  Ambulate to follow HR and BP response     Pt lives alone   Need to confirm she is OK with moving  Has neighbors and helper comes in but only 2x per week     For questions or updates, please contact [facility] Please consult [email or web address] for contact info under        Signed, Dorris Carnes, MD  01/02/2019, 7:31 AM

## 2019-01-03 ENCOUNTER — Inpatient Hospital Stay (HOSPITAL_COMMUNITY): Payer: Medicare Other

## 2019-01-03 ENCOUNTER — Telehealth: Payer: Self-pay

## 2019-01-03 DIAGNOSIS — D509 Iron deficiency anemia, unspecified: Secondary | ICD-10-CM

## 2019-01-03 DIAGNOSIS — G4733 Obstructive sleep apnea (adult) (pediatric): Secondary | ICD-10-CM | POA: Diagnosis not present

## 2019-01-03 DIAGNOSIS — D649 Anemia, unspecified: Secondary | ICD-10-CM | POA: Diagnosis not present

## 2019-01-03 DIAGNOSIS — I4819 Other persistent atrial fibrillation: Secondary | ICD-10-CM

## 2019-01-03 DIAGNOSIS — R4182 Altered mental status, unspecified: Secondary | ICD-10-CM

## 2019-01-03 DIAGNOSIS — Z9989 Dependence on other enabling machines and devices: Secondary | ICD-10-CM | POA: Diagnosis not present

## 2019-01-03 LAB — COMPREHENSIVE METABOLIC PANEL
ALT: 17 U/L (ref 0–44)
AST: 19 U/L (ref 15–41)
Albumin: 2.1 g/dL — ABNORMAL LOW (ref 3.5–5.0)
Alkaline Phosphatase: 53 U/L (ref 38–126)
Anion gap: 9 (ref 5–15)
BUN: 19 mg/dL (ref 8–23)
CO2: 21 mmol/L — ABNORMAL LOW (ref 22–32)
Calcium: 8.4 mg/dL — ABNORMAL LOW (ref 8.9–10.3)
Chloride: 103 mmol/L (ref 98–111)
Creatinine, Ser: 1.44 mg/dL — ABNORMAL HIGH (ref 0.44–1.00)
GFR calc Af Amer: 39 mL/min — ABNORMAL LOW (ref >60)
GFR calc non Af Amer: 33 mL/min — ABNORMAL LOW (ref >60)
Glucose, Bld: 129 mg/dL — ABNORMAL HIGH (ref 70–99)
Potassium: 4.1 mmol/L (ref 3.5–5.1)
Sodium: 133 mmol/L — ABNORMAL LOW (ref 135–145)
Total Bilirubin: 0.5 mg/dL (ref 0.3–1.2)
Total Protein: 4.8 g/dL — ABNORMAL LOW (ref 6.5–8.1)

## 2019-01-03 LAB — GLUCOSE, CAPILLARY
Glucose-Capillary: 172 mg/dL — ABNORMAL HIGH (ref 70–99)
Glucose-Capillary: 247 mg/dL — ABNORMAL HIGH (ref 70–99)
Glucose-Capillary: 171 mg/dL — ABNORMAL HIGH (ref 70–99)

## 2019-01-03 LAB — CBC
HCT: 24.7 % — ABNORMAL LOW (ref 36.0–46.0)
Hemoglobin: 7.2 g/dL — ABNORMAL LOW (ref 12.0–15.0)
MCH: 20.8 pg — ABNORMAL LOW (ref 26.0–34.0)
MCHC: 29.1 g/dL — ABNORMAL LOW (ref 30.0–36.0)
MCV: 71.4 fL — ABNORMAL LOW (ref 80.0–100.0)
Platelets: 314 10*3/uL (ref 150–400)
RBC: 3.46 MIL/uL — ABNORMAL LOW (ref 3.87–5.11)
RDW: 27.4 % — ABNORMAL HIGH (ref 11.5–15.5)
WBC: 8.1 10*3/uL (ref 4.0–10.5)
nRBC: 0.6 % — ABNORMAL HIGH (ref 0.0–0.2)

## 2019-01-03 LAB — MAGNESIUM: Magnesium: 1.9 mg/dL (ref 1.7–2.4)

## 2019-01-03 LAB — HEMOGLOBIN AND HEMATOCRIT, BLOOD
HCT: 27 % — ABNORMAL LOW (ref 36.0–46.0)
Hemoglobin: 7.9 g/dL — ABNORMAL LOW (ref 12.0–15.0)

## 2019-01-03 LAB — HEPARIN LEVEL (UNFRACTIONATED): Heparin Unfractionated: 0.56 IU/mL (ref 0.30–0.70)

## 2019-01-03 MED ORDER — GLUCERNA SHAKE PO LIQD
237.0000 mL | Freq: Three times a day (TID) | ORAL | Status: DC
  Administered 2019-01-03 – 2019-01-07 (×11): 237 mL via ORAL

## 2019-01-03 MED ORDER — TAMSULOSIN HCL 0.4 MG PO CAPS
0.4000 mg | ORAL_CAPSULE | Freq: Every day | ORAL | Status: DC
  Administered 2019-01-03 – 2019-01-07 (×5): 0.4 mg via ORAL
  Filled 2019-01-03 (×5): qty 1

## 2019-01-03 MED ORDER — VERAPAMIL HCL ER 120 MG PO TBCR
120.0000 mg | EXTENDED_RELEASE_TABLET | Freq: Every day | ORAL | Status: DC
  Administered 2019-01-03: 120 mg via ORAL
  Filled 2019-01-03: qty 1

## 2019-01-03 MED ORDER — POLYETHYLENE GLYCOL 3350 17 G PO PACK
17.0000 g | PACK | Freq: Two times a day (BID) | ORAL | Status: DC
  Administered 2019-01-03 – 2019-01-07 (×5): 17 g via ORAL
  Filled 2019-01-03 (×7): qty 1

## 2019-01-03 NOTE — Progress Notes (Signed)
Heparin drip on hold due to loss of IV access, IV team consulted for a new IV.

## 2019-01-03 NOTE — Progress Notes (Signed)
Diana [name] for Heparin  Indication: atrial fibrillation  Allergies  Allergen Reactions  . Codeine Sulfate Nausea Only    Patient Measurements: Height: 5\' 8"  (172.7 cm) Weight: 190 lb (86.2 kg) IBW/kg (Calculated) : 63.9 Heparin Dosing Weight: 81kg  Vital Signs: Temp: 98.3 F (36.8 C) (10/26 0445) Temp Source: Oral (10/26 0445) BP: 130/65 (10/26 0445) Pulse Rate: 62 (10/26 0445)  Labs: Recent Labs    12/31/18 1844 01/01/19 0319 01/02/19 0350 01/02/19 1644 01/03/19 0523  HGB 8.8* 8.1* 7.5* 7.5* 7.2*  HCT 30.4* 26.8* 25.4* 25.2* 24.7*  PLT 379 321 336  --  314  HEPARINUNFRC  --   --   --   --  0.56  CREATININE 1.43* 1.32* 1.23*  --   --     Estimated Creatinine Clearance: 39.8 mL/min (A) (by C-G formula based on SCr of 1.23 mg/dL (H)).   Medical History: Past Medical History:  Diagnosis Date  . Anemia   . Anemia of decreased vitamin B12 absorption 05/26/2005  . Arthritis   . Breast cancer ([place]) 2001  . Cataract   . History of tobacco abuse   . Hx of colonic polyps   . Hyperlipidemia   . Hypertension   . Hypothyroidism   . Obesity   . OSA on CPAP 09/2007   AHI 10.6/hr overall, 43.64/hr during REM  . Osteopenia   . Renal insufficiency   . Type 2 diabetes mellitus ([facility])     Medications:  Medications Prior to Admission  Medication Sig Dispense Refill Last Dose  . Apoaequorin (PREVAGEN PO) Take 1 capsule by mouth daily.   12/29/2018  . aspirin 81 MG tablet Take 81 mg by mouth 2 (two) times daily.    12/29/2018  . COMBIGAN 0.2-0.5 % ophthalmic solution Place 1 drop into both eyes every 12 (twelve) hours.    Past Month at Unknown time  . FLUoxetine (PROZAC) 20 MG capsule Take 20 mg by mouth daily.    12/29/2018  . glimepiride (AMARYL) 2 MG tablet Take 2 mg by mouth daily.    12/29/2018  . insulin glargine (LANTUS) 100 UNIT/ML injection Inject 20 Units into the skin at bedtime. Sliding scale   12/29/2018  . levothyroxine  (SYNTHROID, LEVOTHROID) 50 MCG tablet Take 50 mcg by mouth daily.     12/29/2018 at Unknown time  . losartan (COZAAR) 100 MG tablet Take 1 tablet (100 mg total) by mouth daily. NEEDS APPOINTMENT FOR FUTURE REFILLS (Patient taking differently: Take 100 mg by mouth daily. ) 30 tablet 0 12/29/2018  . Multiple Vitamins-Minerals (PRESERVISION AREDS 2) CAPS Take 2 capsules by mouth daily.   12/29/2018  . rosuvastatin (CRESTOR) 20 MG tablet Take 20 mg by mouth daily.   12/29/2018  . traMADol (ULTRAM) 50 MG tablet Take 1 tablet (50 mg total) by mouth every 6 (six) hours as needed for moderate pain. (Patient taking differently: Take 50 mg by mouth 3 (three) times daily as needed for moderate pain. ) 20 tablet 0 Past Month at Unknown time  . verapamil (CALAN-SR) 240 MG CR tablet Take 1 tablet (240 mg total) by mouth at bedtime. Please keep upcoming appt for further refills. 90 tablet 0 12/29/2018   Scheduled:  . sodium chloride   Intravenous Once  . brimonidine  1 drop Both Eyes BID   And  . timolol  1 drop Both Eyes BID  . feeding supplement (ENSURE ENLIVE)  237 mL Oral BID BM  . FLUoxetine  20 mg Oral Daily  . insulin aspart  0-5 Units Subcutaneous QHS  . insulin aspart  0-9 Units Subcutaneous TID WC  . insulin glargine  5 Units Subcutaneous QHS  . levothyroxine  50 mcg Oral Daily  . multivitamin with minerals  1 tablet Oral Daily  . pantoprazole  40 mg Oral BID AC  . polyethylene glycol  17 g Oral Daily  . rosuvastatin  20 mg Oral Daily  . verapamil  180 mg Oral QHS    Assessment: 85 yo female with new onset afib (CHADSVASC= 4 ) and pharmacy consulted to dose heparin. GI was seeing for anemia and she is now s/p EGD with severe ulcerative esophagitis and biopsy on 10/24 -hg= 7.5 (noted 5.2 on 10/22)  10/26 AM update:  Heparin level therapeutic  Hgb low but fairly stable  Goal of Therapy:  Heparin level 0.3-0.7 units/ml Monitor platelets by anticoagulation protocol: Yes   Plan:  -Cont  heparin at 1000 units/hr -Re-check heparin level at [place], PharmD, [place] Pharmacist Phone: [phone]

## 2019-01-03 NOTE — Progress Notes (Signed)
PROGRESS NOTE    Diana [name]  [id number] DOB: 01-14-1934 DOA: 12/30/2018 PCP: [name], PA-C   Brief Narrative:  Diana [name] is a 86 y.o. female with history of diabetes mellitus type 2 on insulin, hypertension, hypothyroidism, history of breast cancer in remission, sleep apnea had a fall at home and was found to be very weak and patient states she called her neighbor and eventually EMS was called.  On arrival patient was found to be weak and mildly confused and when EMS checked her blood sugar it was around 44 patient was given D50 following which blood sugar improved more than 100 and patient was brought to the ER.  Patient states she has been having frequent falls recently most of the time because she feels weak.  Not sure if she lost consciousness.  Denies hitting her head.  During one of the falls 2 days ago she had a left side of the chest and since been hurting her.  Denies any difficulty breathing of any nausea vomiting or diarrhea but has been having some epigastric discomfort with a poor appetite last few weeks.  ED Course: In the ER patient was afebrile.  Labs show creatinine 1.1 glucose 130 albumin 2.7 total protein 5.6 hemoglobin was 5.2 WBC 6.7 platelets 402.  COVID-19 test was negative stool for occult blood was negative.  Patient had CT head CT C-spine CT chest CT abdomen and pelvis which showed left-sided rib fracture with some continuation of the left chest wall.  Trauma service was consulted.  Given the multiple medical issues including severe anemia hypoglycemic episodes patient is being admitted under medical service.  EKG shows A. fib rate controlled.  This happened to be new.  Assessment & Plan:   Principal Problem:   Symptomatic anemia Active Problems:   Breast cancer (HCC)   HTN (hypertension)   OSA on CPAP   Hypothyroidism   DM2 (diabetes mellitus, type 2) (HCC)   Hypoglycemia   Ribs, multiple fractures   Hiatal hernia   Anemia    1. Acute Metabolic Encephalopathy with Slurred Speech and word finding difficulties  Urinary tract infection: Seems to have word finding difficulties and slurred speech.  No other obvious neuro deficits on exam, but she's had falls over the past week.  She notes that this (speech trouble) started about 1 week ago, not clear how accurate this is (spoke to her sister in law who hasn't seen her in a while, but said her housekeeper [name], said things were ok on Monday - she noticed increased falls earlier this week, but did not notice any change in speech until yesterday).  Concerning for CVA, but w/u negative - UA concerning for UTI - will treat. 1. PT/OT/SLP -> recommending SNF/HH SLP 2. Echo as below, Carotid dopplers (1-39% stenosis bilaterally), MRI/A brain without acute infarct - MRA degraded by motion. 3. Neurology c/s - recommending outpatient neurology follow up and treating UTI and avoiding sedating meds 4. Urine cx pending, continue ceftriaxone  2. Symptomatic anemia - seems to be most likely cause of her recent falls and weakness.  No obvous GI bleeding.  Negative FOBT. 1. Transfuse 2 units pRBC 2. Follow post transfusion H/H - Hb 7.2 today  - follow on anticoagulation 3. Labs appear c/w iron def anemia and AOCD 4. GI c/s, appreciate recs - EGD with severe ulcerative esophagitis, GE junction stricture -> biopsied.  PPI BID indefinitely.  Feraheme, iron.  Needs f/u with GI in 6-8 weeks.  Discussed with  Dr. [name] who notes ok with starting anticoagulation in house (will follow repeat H/H and start with heparin to ensure she tolerates this)  3. Hypoglycemia  T2DM, now hyperglycemia - most likely 2/2 insulin with her amaryl.  Will hold both and follow BG q6.  1. Follow BG's - decrease lantus 5units nightly and SSI, continue to monitor. 2. Follow A1c 5.2 (though with anemia) and TSH (wnl).  Hold off on checking cortisol for now.  4. New onset A. Fib - hold off on anticoagulation for now given  severe anemia - with concern for stroke above, will need to discuss with GI when ok for anticoagulation.  Chadsvasc 5. 1. Follow echo (EF 60-65%, see below), TSH (wnl) 2. Bradycardia, will decrease to verapamil to 120 3. Appreciate cardiology c/s - verapamil decreased to 180, stop losartan   5. Left-sided rib fracture status post fall being followed by trauma surgery.  Pain control, IS, pulm toilet, PT.   6. Hypothyroidism on Synthroid check TSH (wnl).  PO synthroid.  7. Hypertension - verapamil and losartan   8. Abnormal left kidney lesion seen in the CAT scan which will need follow-up.  9. Large hiatal hernia seen in the CAT scan.  Will need follow-up.  10. Sleep apnea on CPAP.  11. History of breast cancer in remission.  12. Severe protein calorie malnutrition will need nutrition consult.  13. Urinary Retention: will need foley catheter if this continues today.  Flomax.  Bladder scans.  14. CKD III: baseline creatinine ~1.3.  Fluctuating here.  Follow.    DVT prophylaxis: SCD Code Status: full  Family Communication: none at bedside Disposition Plan: pending further improvement - she'll need SNF   Consultants:   GI  Cardiology  Trauma surgery  Procedures:  Echo IMPRESSIONS    1. Left ventricular ejection fraction, by visual estimation, is 60 to 65%. The left ventricle has normal function. Normal left ventricular size. There is no left ventricular hypertrophy.  2. Left ventricular diastolic function could not be evaluated pattern of LV diastolic filling.  3. Global right ventricle has normal systolic function.The right ventricular size is not well visualized. No increase in right ventricular wall thickness.  4. Left atrial size was normal.  5. Right atrial size was not well visualized.  6. Moderate mitral annular calcification.  7. The mitral valve was not well visualized. Trace mitral valve regurgitation. No evidence of mitral stenosis.  8. The tricuspid valve  is normal in structure. Tricuspid valve regurgitation was not visualized by color flow Doppler.  9. The aortic valve is normal in structure. Aortic valve regurgitation was not visualized by color flow Doppler. Structurally normal aortic valve, with no evidence of sclerosis or stenosis. 10. The pulmonic valve was not well visualized. Pulmonic valve regurgitation is not visualized by color flow Doppler. 11. TR signal is inadequate for assessing pulmonary artery systolic pressure.  Antimicrobials:  Anti-infectives (From admission, onward)   Start     Dose/Rate Route Frequency Ordered Stop   01/02/19 1800  cefTRIAXone (ROCEPHIN) 1 g in sodium chloride 0.9 % 100 mL IVPB    Note to Pharmacy: Give after urine culture has been collected   1 g 200 mL/hr over 30 Minutes Intravenous Every 24 hours 01/02/19 1549        Subjective: Asking for help with breakfast.   Doesn't feel she'll be able to go home yet.   Objective: Vitals:   01/02/19 2008 01/03/19 0445 01/03/19 0824 01/03/19 0825  BP: 116/60 130/65  128/79  Pulse: 71 62  67  Resp: 15 19  19   Temp: 98.1 F (36.7 C) 98.3 F (36.8 C) (!) 97.5 F (36.4 C)   TempSrc: Oral Oral Oral   SpO2: 95% 93%  95%  Weight:      Height:        Intake/Output Summary (Last 24 hours) at 01/03/2019 0922 Last data filed at 01/03/2019 0656 Gross per 24 hour  Intake 580 ml  Output 900 ml  Net -320 ml   Filed Weights   01/01/19 0818  Weight: 86.2 kg    Examination:  General: No acute distress. Cardiovascular: Heart sounds show a regular rate, and rhythm.  Lungs: Clear to auscultation bilaterally. Abdomen: Soft, nontender, nondistended Neurological: Alert.  Moves all extremities 4. Cranial nerves II through XII grossly intact. Skin: Warm and dry. No rashes or lesions. Extremities: No clubbing or cyanosis. No edema.   Data Reviewed: I have personally reviewed following labs and imaging studies  CBC: Recent Labs  Lab 12/30/18 2246 12/31/18  1844 01/01/19 0319 01/02/19 0350 01/02/19 1644 01/03/19 0523  WBC 6.7 8.8 7.7 9.2  --  8.1  NEUTROABS 5.4  --   --   --   --   --   HGB 5.2* 8.8* 8.1* 7.5* 7.5* 7.2*  HCT 20.0* 30.4* 26.8* 25.4* 25.2* 24.7*  MCV 62.9* 69.7* 68.9* 70.4*  --  71.4*  PLT 402* 379 321 336  --  [id number]   Basic Metabolic Panel: Recent Labs  Lab 12/30/18 2246 12/31/18 1844 01/01/19 0319 01/02/19 0350 01/03/19 0523  NA 131* 133* 131* 133* 133*  K 3.7 3.8 4.2 3.9 4.1  CL 99 100 102 103 103  CO2 21* 22 19* 23 21*  GLUCOSE 130* 209* 230* 95 129*  BUN 16 19 19 19 19   CREATININE 1.17* 1.43* 1.32* 1.23* 1.44*  CALCIUM 8.0* 7.9* 7.8* 8.1* 8.4*  MG  --   --  1.8 1.9 1.9   GFR: Estimated Creatinine Clearance: 34 mL/min (A) (by C-G formula based on SCr of 1.44 mg/dL (H)). Liver Function Tests: Recent Labs  Lab 12/30/18 2246 12/31/18 1844 01/01/19 0319 01/02/19 0350 01/03/19 0523  AST 25 18 21 19 19   ALT 19 18 16 16 17   ALKPHOS 70 74 63 57 53  BILITOT 1.4* 1.7* 1.2 0.8 0.5  PROT 5.6* 5.6* 5.0* 4.9* 4.8*  ALBUMIN 2.7* 2.6* 2.3* 2.2* 2.1*   No results for input(s): LIPASE, AMYLASE in the last 168 hours. No results for input(s): AMMONIA in the last 168 hours. Coagulation Profile: No results for input(s): INR, PROTIME in the last 168 hours. Cardiac Enzymes: No results for input(s): CKTOTAL, CKMB, CKMBINDEX, TROPONINI in the last 168 hours. BNP (last 3 results) No results for input(s): PROBNP in the last 8760 hours. HbA1C: Recent Labs    12/31/18 1844  HGBA1C 5.2   CBG: Recent Labs  Lab 01/02/19 0618 01/02/19 1155 01/02/19 1811 01/02/19 1937 01/02/19 2157  GLUCAP 102* 94 140* 202* 192*   Lipid Profile: No results for input(s): CHOL, HDL, LDLCALC, TRIG, CHOLHDL, LDLDIRECT in the last 72 hours. Thyroid Function Tests: Recent Labs    12/31/18 1844  TSH 4.185   Anemia Panel: Recent Labs    12/31/18 1844  VITAMINB12 1,040*  FOLATE 10.4  FERRITIN 14  TIBC 325  IRON 42   RETICCTPCT 1.0   Sepsis Labs: No results for input(s): PROCALCITON, LATICACIDVEN in the last 168 hours.  Recent Results (from the past 240 hour(s))  SARS CORONAVIRUS 2 (TAT 6-24 HRS) Nasopharyngeal Nasopharyngeal Swab     Status: None   Collection Time: 12/30/18 11:32 PM   Specimen: Nasopharyngeal Swab  Result Value Ref Range Status   SARS Coronavirus 2 NEGATIVE NEGATIVE Final    Comment: (NOTE) SARS-CoV-2 target nucleic acids are NOT DETECTED. The SARS-CoV-2 RNA is generally detectable in upper and lower respiratory specimens during the acute phase of infection. Negative results do not preclude SARS-CoV-2 infection, do not rule out co-infections with other pathogens, and should not be used as the sole basis for treatment or other patient management decisions. Negative results must be combined with clinical observations, patient history, and epidemiological information. The expected result is Negative. Fact Sheet for Patients: SugarRoll.be Fact Sheet for Healthcare Providers: https://www.woods-mathews.com/ This test is not yet approved or cleared by the [place] FDA and  has been authorized for detection and/or diagnosis of SARS-CoV-2 by FDA under an Emergency Use Authorization (EUA). This EUA will remain  in effect (meaning this test can be used) for the duration of the COVID-19 declaration under Section 56 4(b)(1) of the Act, 21 U.S.C. section 360bbb-3(b)(1), unless the authorization is terminated or revoked sooner. Performed at [facility], [place] [address], [place], [place] [address]          Radiology Studies: Mr Angio Head Wo Contrast  Result Date: 01/01/2019 CLINICAL DATA:  Focal neuro deficit. Diabetes and hypertension. Breast cancer. Weakness. Recent fall. EXAM: MRI HEAD WITHOUT CONTRAST MRA HEAD WITHOUT CONTRAST TECHNIQUE: Multiplanar, multiecho pulse sequences of the brain and surrounding structures were  obtained without intravenous contrast. Angiographic images of the head were obtained using MRA technique without contrast. COMPARISON:  CT head 12/31/2018 FINDINGS: MRI HEAD FINDINGS Brain: Moderate atrophy. Moderate chronic microvascular ischemic changes in the white matter. Negative for acute infarct.  Negative for hemorrhage or mass. Vascular: Normal arterial flow voids with exception of distal right vertebral artery which is not visualized. This could be congenital or acquired. Skull and upper cervical spine: No focal skeletal lesion Sinuses/Orbits: Mild mucosal edema paranasal sinuses. Large right mastoid effusion. Small left mastoid effusion. Bilateral cataract surgery. Other: Motion degraded study MRA HEAD FINDINGS Image quality degraded by extensive motion. Limited information is available on the study. Left vertebral artery supplies the basilar. Right vertebral artery not visualized. Basilar is patent. Posterior cerebral arteries are patent but irregular and likely contain atherosclerotic disease Extensive irregularity in the internal carotid artery bilaterally in the precavernous and cavernous segment. Extensive irregularity in the anterior middle cerebral arteries. Suspect underlying atherosclerotic disease however there is considerable artifact. IMPRESSION: 1. No acute infarct. Atrophy and moderate chronic microvascular ischemia. 2. MRA is degraded by motion with little diagnostic information. Suspect underlying intracranial atherosclerotic disease which could be severe. Electronically Signed   By: Franchot Gallo M.D.   On: 01/01/2019 13:57   Mr Brain Wo Contrast  Result Date: 01/01/2019 CLINICAL DATA:  Focal neuro deficit. Diabetes and hypertension. Breast cancer. Weakness. Recent fall. EXAM: MRI HEAD WITHOUT CONTRAST MRA HEAD WITHOUT CONTRAST TECHNIQUE: Multiplanar, multiecho pulse sequences of the brain and surrounding structures were obtained without intravenous contrast. Angiographic images of  the head were obtained using MRA technique without contrast. COMPARISON:  CT head 12/31/2018 FINDINGS: MRI HEAD FINDINGS Brain: Moderate atrophy. Moderate chronic microvascular ischemic changes in the white matter. Negative for acute infarct.  Negative for hemorrhage or mass. Vascular: Normal arterial flow voids with exception of distal right vertebral artery which is not visualized. This could be congenital  or acquired. Skull and upper cervical spine: No focal skeletal lesion Sinuses/Orbits: Mild mucosal edema paranasal sinuses. Large right mastoid effusion. Small left mastoid effusion. Bilateral cataract surgery. Other: Motion degraded study MRA HEAD FINDINGS Image quality degraded by extensive motion. Limited information is available on the study. Left vertebral artery supplies the basilar. Right vertebral artery not visualized. Basilar is patent. Posterior cerebral arteries are patent but irregular and likely contain atherosclerotic disease Extensive irregularity in the internal carotid artery bilaterally in the precavernous and cavernous segment. Extensive irregularity in the anterior middle cerebral arteries. Suspect underlying atherosclerotic disease however there is considerable artifact. IMPRESSION: 1. No acute infarct. Atrophy and moderate chronic microvascular ischemia. 2. MRA is degraded by motion with little diagnostic information. Suspect underlying intracranial atherosclerotic disease which could be severe. Electronically Signed   By: Franchot Gallo M.D.   On: 01/01/2019 13:57   Vas US Carotid  Result Date: 01/01/2019 Carotid Arterial Duplex Study Indications:  Stroke like symptoms. Risk Factors: Hypertension, hyperlipidemia, Diabetes. Performing Technologist: Abram Sander RVS  Examination Guidelines: A complete evaluation includes B-mode imaging, spectral Doppler, color Doppler, and power Doppler as needed of all accessible portions of each vessel. Bilateral testing is considered an integral  part of a complete examination. Limited examinations for reoccurring indications may be performed as noted.  Right Carotid Findings: +----------+--------+--------+--------+------------------+--------+           PSV cm/sEDV cm/sStenosisPlaque DescriptionComments +----------+--------+--------+--------+------------------+--------+ CCA Prox  60      6               heterogenous               +----------+--------+--------+--------+------------------+--------+ CCA Distal53      7               heterogenous               +----------+--------+--------+--------+------------------+--------+ ICA Prox  40      8       1-39%   heterogenous               +----------+--------+--------+--------+------------------+--------+ ICA Distal59      14                                         +----------+--------+--------+--------+------------------+--------+ ECA       54                                                 +----------+--------+--------+--------+------------------+--------+ +----------+--------+-------+--------+-------------------+           PSV cm/sEDV cmsDescribeArm Pressure (mmHG) +----------+--------+-------+--------+-------------------+ [id number]                                         +----------+--------+-------+--------+-------------------+ +---------+--------+--+--------+--+---------+ VertebralPSV cm/s61EDV cm/s14Antegrade +---------+--------+--+--------+--+---------+  Left Carotid Findings: +----------+--------+--------+--------+------------------+--------+           PSV cm/sEDV cm/sStenosisPlaque DescriptionComments +----------+--------+--------+--------+------------------+--------+ CCA Prox  83      18              heterogenous               +----------+--------+--------+--------+------------------+--------+ CCA Distal45      10  heterogenous               +----------+--------+--------+--------+------------------+--------+  ICA Prox  47      9       1-39%   heterogenous               +----------+--------+--------+--------+------------------+--------+ ICA Distal59      19                                         +----------+--------+--------+--------+------------------+--------+ ECA       46      6                                          +----------+--------+--------+--------+------------------+--------+ +----------+--------+--------+--------+-------------------+           PSV cm/sEDV cm/sDescribeArm Pressure (mmHG) +----------+--------+--------+--------+-------------------+ [id number]                                          +----------+--------+--------+--------+-------------------+ +---------+--------+--+--------+--+---------+ VertebralPSV cm/s49EDV cm/s10Antegrade +---------+--------+--+--------+--+---------+  Summary: Right Carotid: Velocities in the right ICA are consistent with a 1-39% stenosis. Left Carotid: Velocities in the left ICA are consistent with a 1-39% stenosis. Vertebrals: Bilateral vertebral arteries demonstrate antegrade flow. *See table(s) above for measurements and observations.  Electronically signed by Monica Martinez MD on 01/01/2019 at 6:37:19 PM.    Final         Scheduled Meds: . sodium chloride   Intravenous Once  . brimonidine  1 drop Both Eyes BID   And  . timolol  1 drop Both Eyes BID  . feeding supplement (ENSURE ENLIVE)  237 mL Oral BID BM  . FLUoxetine  20 mg Oral Daily  . insulin aspart  0-5 Units Subcutaneous QHS  . insulin aspart  0-9 Units Subcutaneous TID WC  . insulin glargine  5 Units Subcutaneous QHS  . levothyroxine  50 mcg Oral Daily  . multivitamin with minerals  1 tablet Oral Daily  . pantoprazole  40 mg Oral BID AC  . polyethylene glycol  17 g Oral Daily  . rosuvastatin  20 mg Oral Daily  . verapamil  120 mg Oral QHS   Continuous Infusions: . cefTRIAXone (ROCEPHIN)  IV 1 g (01/02/19 1729)  . heparin 1,000 Units/hr  (01/02/19 2145)     LOS: 2 days    Time spent: over 30 min    Fayrene Helper, MD [facility] Pager AMION  If 7PM-7AM, please contact night-coverage [email or web address] Password [email or web address] 01/03/2019, 9:22 AM

## 2019-01-03 NOTE — Telephone Encounter (Signed)
-----   Message from Milus Banister, MD sent at 01/01/2019  8:50 AM EDT ----- She is going home this weekend.  She needs cbc in 4 weeks and ROV with me in 6-8 weeks.  Remind her she needs to take the PPI TWICE daily every day and also take OTC iron supplement once daily  thanks

## 2019-01-03 NOTE — Progress Notes (Signed)
EEG complete - results pending 

## 2019-01-03 NOTE — Progress Notes (Signed)
PT Cancellation Note  Patient Details Name: Diana [name] MRN: [id number] DOB: 03-Jun-1933   Cancelled Treatment:    Reason Eval/Treat Not Completed: Patient declined, no reason specified Pt eating lunch and declines working with therapy, "I am not wanting to put in the effort." Will follow.   Marguarite Arbour A [place] 01/03/2019, 2:08 PM Wray Kearns, PT, DPT Acute Rehabilitation Services Pager [phone] Office [phone]

## 2019-01-03 NOTE — Progress Notes (Signed)
Progress Note  Patient Name: Diana [name] Date of Encounter: 01/03/2019  Primary Cardiologist: Shelva Majestic, MD   Subjective   Pt is resting in bed. She denies chest discomfort, shortness of breath, palpitations. She says that she did have palpitations over the last 2 weeks- "felt every heart beat". At home she uses a wheelchair to get around. She has been sleeping in her sunroom on the first floor due to being unable to get to her bedroom upstairs. She admits that she has not been using her CPAP as it is still up in her bedroom.  She is unable to tell if she feels stronger than when she presented. She has not done any activity.   Inpatient Medications    Scheduled Meds: . sodium chloride   Intravenous Once  . brimonidine  1 drop Both Eyes BID   And  . timolol  1 drop Both Eyes BID  . feeding supplement (ENSURE ENLIVE)  237 mL Oral BID BM  . FLUoxetine  20 mg Oral Daily  . insulin aspart  0-5 Units Subcutaneous QHS  . insulin aspart  0-9 Units Subcutaneous TID WC  . insulin glargine  5 Units Subcutaneous QHS  . levothyroxine  50 mcg Oral Daily  . multivitamin with minerals  1 tablet Oral Daily  . pantoprazole  40 mg Oral BID AC  . polyethylene glycol  17 g Oral BID  . rosuvastatin  20 mg Oral Daily  . tamsulosin  0.4 mg Oral Daily  . verapamil  120 mg Oral QHS   Continuous Infusions: . cefTRIAXone (ROCEPHIN)  IV 1 g (01/02/19 1729)  . heparin 1,000 Units/hr (01/02/19 2145)   PRN Meds: acetaminophen **OR** acetaminophen, alum & mag hydroxide-simeth, ondansetron **OR** ondansetron (ZOFRAN) IV   Vital Signs    Vitals:   01/02/19 2008 01/03/19 0445 01/03/19 0824 01/03/19 0825  BP: 116/60 130/65  128/79  Pulse: 71 62  67  Resp: 15 19  19   Temp: 98.1 F (36.7 C) 98.3 F (36.8 C) (!) 97.5 F (36.4 C)   TempSrc: Oral Oral Oral   SpO2: 95% 93%  95%  Weight:      Height:        Intake/Output Summary (Last 24 hours) at 01/03/2019 0950 Last data filed at  01/03/2019 0656 Gross per 24 hour  Intake 580 ml  Output 900 ml  Net -320 ml   Last 3 Weights 01/01/2019 12/06/2017 12/04/2016  Weight (lbs) 190 lb 225 lb 235 lb  Weight (kg) 86.183 kg 102.059 kg 106.595 kg      Telemetry    Afib, 60's, Down to 50's during the night.  - Personally Reviewed  ECG    No new tracings for review  Physical Exam   GEN: No acute distress.   Neck: No JVD Cardiac: irregularly irregular rhythm, no murmurs, rubs, or gallops.  Respiratory: Clear to auscultation bilaterally. GI: Soft, nontender, non-distended  MS: No edema; No deformity. Neuro:  Nonfocal  Psych: Normal affect   Labs    High Sensitivity Troponin:  No results for input(s): TROPONINIHS in the last 720 hours.    Chemistry Recent Labs  Lab 01/01/19 0319 01/02/19 0350 01/03/19 0523  NA 131* 133* 133*  K 4.2 3.9 4.1  CL 102 103 103  CO2 19* 23 21*  GLUCOSE 230* 95 129*  BUN 19 19 19   CREATININE 1.32* 1.23* 1.44*  CALCIUM 7.8* 8.1* 8.4*  PROT 5.0* 4.9* 4.8*  ALBUMIN 2.3* 2.2* 2.1*  AST 21 19 19   ALT 16 16 17   ALKPHOS 63 57 53  BILITOT 1.2 0.8 0.5  GFRNONAA 37* 41* 33*  GFRAA 43* 47* 39*  ANIONGAP 10 7 9      Hematology Recent Labs  Lab 01/01/19 0319 01/02/19 0350 01/02/19 1644 01/03/19 0523  WBC 7.7 9.2  --  8.1  RBC 3.89 3.61*  --  3.46*  HGB 8.1* 7.5* 7.5* 7.2*  HCT 26.8* 25.4* 25.2* 24.7*  MCV 68.9* 70.4*  --  71.4*  MCH 20.8* 20.8*  --  20.8*  MCHC 30.2 29.5*  --  29.1*  RDW 25.8* 26.0*  --  27.4*  PLT 321 336  --  314    BNP Recent Labs  Lab 12/31/18 1844  BNP 613.3*     DDimer No results for input(s): DDIMER in the last 168 hours.   Radiology    Mr Angio Head Wo Contrast  Result Date: 01/01/2019 CLINICAL DATA:  Focal neuro deficit. Diabetes and hypertension. Breast cancer. Weakness. Recent fall. EXAM: MRI HEAD WITHOUT CONTRAST MRA HEAD WITHOUT CONTRAST TECHNIQUE: Multiplanar, multiecho pulse sequences of the brain and surrounding structures were  obtained without intravenous contrast. Angiographic images of the head were obtained using MRA technique without contrast. COMPARISON:  CT head 12/31/2018 FINDINGS: MRI HEAD FINDINGS Brain: Moderate atrophy. Moderate chronic microvascular ischemic changes in the white matter. Negative for acute infarct.  Negative for hemorrhage or mass. Vascular: Normal arterial flow voids with exception of distal right vertebral artery which is not visualized. This could be congenital or acquired. Skull and upper cervical spine: No focal skeletal lesion Sinuses/Orbits: Mild mucosal edema paranasal sinuses. Large right mastoid effusion. Small left mastoid effusion. Bilateral cataract surgery. Other: Motion degraded study MRA HEAD FINDINGS Image quality degraded by extensive motion. Limited information is available on the study. Left vertebral artery supplies the basilar. Right vertebral artery not visualized. Basilar is patent. Posterior cerebral arteries are patent but irregular and likely contain atherosclerotic disease Extensive irregularity in the internal carotid artery bilaterally in the precavernous and cavernous segment. Extensive irregularity in the anterior middle cerebral arteries. Suspect underlying atherosclerotic disease however there is considerable artifact. IMPRESSION: 1. No acute infarct. Atrophy and moderate chronic microvascular ischemia. 2. MRA is degraded by motion with little diagnostic information. Suspect underlying intracranial atherosclerotic disease which could be severe. Electronically Signed   By: Franchot Gallo M.D.   On: 01/01/2019 13:57   Mr Brain Wo Contrast  Result Date: 01/01/2019 CLINICAL DATA:  Focal neuro deficit. Diabetes and hypertension. Breast cancer. Weakness. Recent fall. EXAM: MRI HEAD WITHOUT CONTRAST MRA HEAD WITHOUT CONTRAST TECHNIQUE: Multiplanar, multiecho pulse sequences of the brain and surrounding structures were obtained without intravenous contrast. Angiographic images of  the head were obtained using MRA technique without contrast. COMPARISON:  CT head 12/31/2018 FINDINGS: MRI HEAD FINDINGS Brain: Moderate atrophy. Moderate chronic microvascular ischemic changes in the white matter. Negative for acute infarct.  Negative for hemorrhage or mass. Vascular: Normal arterial flow voids with exception of distal right vertebral artery which is not visualized. This could be congenital or acquired. Skull and upper cervical spine: No focal skeletal lesion Sinuses/Orbits: Mild mucosal edema paranasal sinuses. Large right mastoid effusion. Small left mastoid effusion. Bilateral cataract surgery. Other: Motion degraded study MRA HEAD FINDINGS Image quality degraded by extensive motion. Limited information is available on the study. Left vertebral artery supplies the basilar. Right vertebral artery not visualized. Basilar is patent. Posterior cerebral arteries are patent but irregular and likely contain atherosclerotic  disease Extensive irregularity in the internal carotid artery bilaterally in the precavernous and cavernous segment. Extensive irregularity in the anterior middle cerebral arteries. Suspect underlying atherosclerotic disease however there is considerable artifact. IMPRESSION: 1. No acute infarct. Atrophy and moderate chronic microvascular ischemia. 2. MRA is degraded by motion with little diagnostic information. Suspect underlying intracranial atherosclerotic disease which could be severe. Electronically Signed   By: Franchot Gallo M.D.   On: 01/01/2019 13:57   Vas US Carotid  Result Date: 01/01/2019 Carotid Arterial Duplex Study Indications:  Stroke like symptoms. Risk Factors: Hypertension, hyperlipidemia, Diabetes. Performing Technologist: Abram Sander RVS  Examination Guidelines: A complete evaluation includes B-mode imaging, spectral Doppler, color Doppler, and power Doppler as needed of all accessible portions of each vessel. Bilateral testing is considered an integral  part of a complete examination. Limited examinations for reoccurring indications may be performed as noted.  Right Carotid Findings: +----------+--------+--------+--------+------------------+--------+           PSV cm/sEDV cm/sStenosisPlaque DescriptionComments +----------+--------+--------+--------+------------------+--------+ CCA Prox  60      6               heterogenous               +----------+--------+--------+--------+------------------+--------+ CCA Distal53      7               heterogenous               +----------+--------+--------+--------+------------------+--------+ ICA Prox  40      8       1-39%   heterogenous               +----------+--------+--------+--------+------------------+--------+ ICA Distal59      14                                         +----------+--------+--------+--------+------------------+--------+ ECA       54                                                 +----------+--------+--------+--------+------------------+--------+ +----------+--------+-------+--------+-------------------+           PSV cm/sEDV cmsDescribeArm Pressure (mmHG) +----------+--------+-------+--------+-------------------+ [id number]                                         +----------+--------+-------+--------+-------------------+ +---------+--------+--+--------+--+---------+ VertebralPSV cm/s61EDV cm/s14Antegrade +---------+--------+--+--------+--+---------+  Left Carotid Findings: +----------+--------+--------+--------+------------------+--------+           PSV cm/sEDV cm/sStenosisPlaque DescriptionComments +----------+--------+--------+--------+------------------+--------+ CCA Prox  83      18              heterogenous               +----------+--------+--------+--------+------------------+--------+ CCA Distal45      10              heterogenous               +----------+--------+--------+--------+------------------+--------+  ICA Prox  47      9       1-39%   heterogenous               +----------+--------+--------+--------+------------------+--------+ ICA Distal59      19                                         +----------+--------+--------+--------+------------------+--------+  ECA       46      6                                          +----------+--------+--------+--------+------------------+--------+ +----------+--------+--------+--------+-------------------+           PSV cm/sEDV cm/sDescribeArm Pressure (mmHG) +----------+--------+--------+--------+-------------------+ [id number]                                          +----------+--------+--------+--------+-------------------+ +---------+--------+--+--------+--+---------+ VertebralPSV cm/s49EDV cm/s10Antegrade +---------+--------+--+--------+--+---------+  Summary: Right Carotid: Velocities in the right ICA are consistent with a 1-39% stenosis. Left Carotid: Velocities in the left ICA are consistent with a 1-39% stenosis. Vertebrals: Bilateral vertebral arteries demonstrate antegrade flow. *See table(s) above for measurements and observations.  Electronically signed by Monica Martinez MD on 01/01/2019 at 6:37:19 PM.    Final     Cardiac Studies   Echocardiogram 12/31/2018 Sonographer Comments: Image acquisition challenging due to mastectomy and see comments. IMPRESSIONS    1. Left ventricular ejection fraction, by visual estimation, is 60 to 65%. The left ventricle has normal function. Normal left ventricular size. There is no left ventricular hypertrophy.  2. Left ventricular diastolic function could not be evaluated pattern of LV diastolic filling.  3. Global right ventricle has normal systolic function.The right ventricular size is not well visualized. No increase in right ventricular wall thickness.  4. Left atrial size was normal.  5. Right atrial size was not well visualized.  6. Moderate mitral annular  calcification.  7. The mitral valve was not well visualized. Trace mitral valve regurgitation. No evidence of mitral stenosis.  8. The tricuspid valve is normal in structure. Tricuspid valve regurgitation was not visualized by color flow Doppler.  9. The aortic valve is normal in structure. Aortic valve regurgitation was not visualized by color flow Doppler. Structurally normal aortic valve, with no evidence of sclerosis or stenosis. 10. The pulmonic valve was not well visualized. Pulmonic valve regurgitation is not visualized by color flow Doppler. 11. TR signal is inadequate for assessing pulmonary artery systolic pressure.  Carotid US 01/01/2019 Summary: -Right Carotid: Velocities in the right ICA are consistent with a 1-39% stenosis. -Left Carotid: Velocities in the left ICA are consistent with a 1-39% stenosis. -Vertebrals: Bilateral vertebral arteries demonstrate antegrade flow.   Patient Profile     85 y.o. female with a hx of HTN, HLD, IDDM, Hypothyroidism, CKD, Obesity and OSAwho is being seen for the evaluation of Atrial Fibrillationat the request of Dr.Powell. Pt presented with progressive weakness and falls. Found to be in rate controlled afib. No prior hx of arrhythmia. Severe anemia on admission.   Assessment & Plan    Atrial fibrillation -New afib on admission, rate controlled on home verapamil. Rates in the 60's, 50's during sleep.  -Echocardiogram with normal EF.  -CHADS VASC 5 (age (4), F, HTN, DM). There is indication for anticoagulation with increased stroke risk. Also pt presented with slurred speech and difficulty with work finding although no findings of stroke on imaging. She had severe anemia on admission. Per Dr. Abel Presto note, he discussed anticoagulation with GI, Dr. [name], and OK to start. Heparin has been started and following Hgb to see that she tolerates prior to switching to oral.  -Pt has CPAP for sleep apnea but has  not been using. I reinforced use of  CPAP to help with control of afib. (she will need to get someone at home bring it down to where she is now sleeping from her bedroom upstairs)   Symptomatic anemia -Hgb 5.2 on admission. Now S/P transfusion of 2 U PRBCs. Hgb up to 8.8, has been drifting down >>7.2.  -EGD found severe ulcerative esophagitis, GE junction stricture. No on PPI BID, iron. Plan for outpatient GI follow up.   Hypertension -Losartan was stopped and verapamil decreased.  -BP well controlled.   Hyperlipidemia -On lipitor  NSVT -No new episodes noted on tele.   Hypothyroidism -On synthroid. TSH in normal range.   OSA -As above, pt has CPAP but has not been using. Encouraged to resume CPAP at home.   For questions or updates, please contact [place] Please consult [email or web address] for contact info under        Signed, Daune Perch, NP  01/03/2019, 9:50 AM

## 2019-01-03 NOTE — Procedures (Signed)
Patient Name: Diana [name]  MRN: [id number]  Epilepsy Attending: Lora Havens  Referring Physician/Provider: Dr. Fayrene Helper Date: 01/03/2019 Duration: 26.53 mins  Patient history: 85 year old female with altered mental status.  EEG to evaluate for seizures.  Level of alertness: Awake, drowsy  AEDs during EEG study: None  Technical aspects: This EEG study was done with scalp electrodes positioned according to the 10-20 International system of electrode placement. Electrical activity was acquired at a sampling rate of 500Hz  and reviewed with a high frequency filter of 70Hz  and a low frequency filter of 1Hz . EEG data were recorded continuously and digitally stored.   Description: During awake state, the posterior dominant rhythm consists of 7 Hz activity of moderate voltage (25-35 uV) seen predominantly in posterior head regions, symmetric and reactive to eye opening and eye closing. Drowsiness was characterized by attenuation of the posterior background rhythm.  EEG also showed continuous generalized 2 to 5 Hz theta-delta slowing. Hyperventilation and photic stimulation were not performed.  Abnormality -Continuous slow, generalized -Background slow  IMPRESSION: This study is suggestive of moderate diffuse encephalopathy, nonspecific to etiology. No seizures or epileptiform discharges were seen throughout the recording.  Blenda Wisecup Barbra Sarks

## 2019-01-03 NOTE — Telephone Encounter (Signed)
The pt has been scheduled for a follow up appt with Dr [name] on 02/21/19 at 1:50 pm and also lab order in for 4 weeks.  Letter mailed with this information to the pt .

## 2019-01-04 DIAGNOSIS — D649 Anemia, unspecified: Secondary | ICD-10-CM | POA: Diagnosis not present

## 2019-01-04 LAB — URINE CULTURE: Culture: 80000 — AB

## 2019-01-04 LAB — COMPREHENSIVE METABOLIC PANEL
ALT: 16 U/L (ref 0–44)
AST: 15 U/L (ref 15–41)
Albumin: 2 g/dL — ABNORMAL LOW (ref 3.5–5.0)
Alkaline Phosphatase: 58 U/L (ref 38–126)
Anion gap: 8 (ref 5–15)
BUN: 22 mg/dL (ref 8–23)
CO2: 21 mmol/L — ABNORMAL LOW (ref 22–32)
Calcium: 8.3 mg/dL — ABNORMAL LOW (ref 8.9–10.3)
Chloride: 99 mmol/L (ref 98–111)
Creatinine, Ser: 1.48 mg/dL — ABNORMAL HIGH (ref 0.44–1.00)
GFR calc Af Amer: 38 mL/min — ABNORMAL LOW (ref >60)
GFR calc non Af Amer: 32 mL/min — ABNORMAL LOW (ref >60)
Glucose, Bld: 172 mg/dL — ABNORMAL HIGH (ref 70–99)
Potassium: 4.4 mmol/L (ref 3.5–5.1)
Sodium: 128 mmol/L — ABNORMAL LOW (ref 135–145)
Total Bilirubin: 0.7 mg/dL (ref 0.3–1.2)
Total Protein: 4.9 g/dL — ABNORMAL LOW (ref 6.5–8.1)

## 2019-01-04 LAB — GLUCOSE, CAPILLARY
Glucose-Capillary: 139 mg/dL — ABNORMAL HIGH (ref 70–99)
Glucose-Capillary: 179 mg/dL — ABNORMAL HIGH (ref 70–99)
Glucose-Capillary: 159 mg/dL — ABNORMAL HIGH (ref 70–99)
Glucose-Capillary: 245 mg/dL — ABNORMAL HIGH (ref 70–99)

## 2019-01-04 LAB — CBC
HCT: 26 % — ABNORMAL LOW (ref 36.0–46.0)
Hemoglobin: 7.5 g/dL — ABNORMAL LOW (ref 12.0–15.0)
MCH: 20.9 pg — ABNORMAL LOW (ref 26.0–34.0)
MCHC: 28.8 g/dL — ABNORMAL LOW (ref 30.0–36.0)
MCV: 72.6 fL — ABNORMAL LOW (ref 80.0–100.0)
Platelets: 313 10*3/uL (ref 150–400)
RBC: 3.58 MIL/uL — ABNORMAL LOW (ref 3.87–5.11)
RDW: 28.6 % — ABNORMAL HIGH (ref 11.5–15.5)
WBC: 9.4 10*3/uL (ref 4.0–10.5)
nRBC: 0.6 % — ABNORMAL HIGH (ref 0.0–0.2)

## 2019-01-04 LAB — SARS CORONAVIRUS 2 (TAT 6-24 HRS): SARS Coronavirus 2: NEGATIVE

## 2019-01-04 LAB — SURGICAL PATHOLOGY

## 2019-01-04 LAB — HEPARIN LEVEL (UNFRACTIONATED): Heparin Unfractionated: 0.42 IU/mL (ref 0.30–0.70)

## 2019-01-04 LAB — MAGNESIUM: Magnesium: 1.8 mg/dL (ref 1.7–2.4)

## 2019-01-04 MED ORDER — SODIUM CHLORIDE 0.9 % IV SOLN
INTRAVENOUS | Status: AC
  Administered 2019-01-04 – 2019-01-05 (×2): via INTRAVENOUS

## 2019-01-04 MED ORDER — CEPHALEXIN 500 MG PO CAPS
500.0000 mg | ORAL_CAPSULE | Freq: Three times a day (TID) | ORAL | Status: DC
  Administered 2019-01-04 – 2019-01-06 (×5): 500 mg via ORAL
  Filled 2019-01-04 (×5): qty 1

## 2019-01-04 MED ORDER — APIXABAN 5 MG PO TABS
5.0000 mg | ORAL_TABLET | Freq: Two times a day (BID) | ORAL | Status: DC
  Administered 2019-01-04 – 2019-01-05 (×3): 5 mg via ORAL
  Filled 2019-01-04 (×3): qty 1

## 2019-01-04 NOTE — NC FL2 (Signed)
[place] LEVEL OF CARE SCREENING TOOL     IDENTIFICATION  Patient Name: Diana [name] Birthdate: [birth year] Sex: female Admission Date (Current Location): 12/30/2018  [facility] and [place] Number:  Herbalist and Address:  [facility] [place], [place] [address] [place], [place] [address]      Provider Number: [id number]  Attending Physician Name and Address:  Elodia Florence., *  Relative Name and Phone Number:  Lelon Frohlich [phone]    Current Level of Care: Hospital Recommended Level of Care: [place] Prior Approval Number:    Date Approved/Denied:   PASRR Number: [id number]  Discharge Plan: SNF    Current Diagnoses: Patient Active Problem List   Diagnosis Date Noted  . Persistent atrial fibrillation ([place])   . Anemia 01/01/2019  . Hiatal hernia   . Symptomatic anemia 12/31/2018  . Hypoglycemia 12/31/2018  . Ribs, multiple fractures 12/31/2018  . HTN (hypertension) 05/15/2013  . OSA on CPAP 05/15/2013  . Hypothyroidism 05/15/2013  . DM2 (diabetes mellitus, type 2) ([place]) 05/15/2013  . Lower extremity edema 05/15/2013  . Renal insufficiency 05/15/2013  . Morbid obesity ([place]) 05/15/2013  . Breast cancer ([place]) 03/18/2011    Orientation RESPIRATION BLADDER Height & Weight     Self  Normal Incontinent, External catheter Weight: 200 lb 6.4 oz (90.9 kg) Height:  5\' 8"  (172.7 cm)  BEHAVIORAL SYMPTOMS/MOOD NEUROLOGICAL BOWEL NUTRITION STATUS      Continent Diet(see discharge summary)  AMBULATORY STATUS COMMUNICATION OF NEEDS Skin   Extensive Assist Verbally Other (Comment)(ecchymosis on arms)                       Personal Care Assistance Level of Assistance  Bathing, Feeding, Dressing, Total care Bathing Assistance: Limited assistance Feeding assistance: Limited assistance(needs set up) Dressing Assistance: Limited assistance Total Care Assistance: Maximum assistance   Functional Limitations  Info  Sight, Hearing, Speech Sight Info: Adequate Hearing Info: Adequate Speech Info: Adequate    SPECIAL CARE FACTORS FREQUENCY  PT (By licensed PT), OT (By licensed OT)     PT Frequency: min 5x weekly OT Frequency: min 5x weekly            Contractures Contractures Info: Not present    Additional Factors Info  Code Status, Allergies Code Status Info: full Allergies Info: Codeine Sulfate           Current Medications (01/04/2019):  This is the current hospital active medication list Current Facility-Administered Medications  Medication Dose Route Frequency Provider Last Rate Last Dose  . 0.9 %  sodium chloride infusion (Manually program via Guardrails IV Fluids)   Intravenous Once Milus Banister, MD      . acetaminophen (TYLENOL) tablet 650 mg  650 mg Oral Q6H PRN Milus Banister, MD       Or  . acetaminophen (TYLENOL) suppository 650 mg  650 mg Rectal Q6H PRN Milus Banister, MD      . alum & mag hydroxide-simeth (MAALOX/MYLANTA) 200-200-20 MG/5ML suspension 30 mL  30 mL Oral Q4H PRN Elodia Florence., MD   30 mL at 01/01/19 1905  . apixaban (ELIQUIS) tablet 5 mg  5 mg Oral BID Einar Grad, RPH   5 mg at 01/04/19 1222  . brimonidine (ALPHAGAN) 0.2 % ophthalmic solution 1 drop  1 drop Both Eyes BID Milus Banister, MD   1 drop at 01/04/19 0840   And  . timolol (  TIMOPTIC) 0.5 % ophthalmic solution 1 drop  1 drop Both Eyes BID Milus Banister, MD   1 drop at 01/04/19 0840  . cephALEXin (KEFLEX) capsule 500 mg  500 mg Oral Q8H Powell, Kendra Opitz., MD      . feeding supplement (GLUCERNA SHAKE) (GLUCERNA SHAKE) liquid 237 mL  237 mL Oral TID BM Elodia Florence., MD   237 mL at 01/04/19 1535  . FLUoxetine (PROZAC) capsule 20 mg  20 mg Oral Daily Milus Banister, MD   20 mg at 01/04/19 [id number]  . insulin aspart (novoLOG) injection 0-5 Units  0-5 Units Subcutaneous QHS Elodia Florence., MD   2 Units at 01/01/19 2215  . insulin aspart (novoLOG)  injection 0-9 Units  0-9 Units Subcutaneous TID WC Elodia Florence., MD   2 Units at 01/04/19 1222  . insulin glargine (LANTUS) injection 5 Units  5 Units Subcutaneous QHS Elodia Florence., MD   5 Units at 01/03/19 2203  . levothyroxine (SYNTHROID) tablet 50 mcg  50 mcg Oral Daily Milus Banister, MD   50 mcg at 01/04/19 [id number]  . multivitamin with minerals tablet 1 tablet  1 tablet Oral Daily Elodia Florence., MD   1 tablet at 01/04/19 [phone]  . ondansetron (ZOFRAN) tablet 4 mg  4 mg Oral Q6H PRN Milus Banister, MD       Or  . ondansetron [facility]) injection 4 mg  4 mg Intravenous Q6H PRN Milus Banister, MD      . pantoprazole (PROTONIX) EC tablet 40 mg  40 mg Oral BID AC Milus Banister, MD   40 mg at 01/04/19 [id number]  . polyethylene glycol (MIRALAX / GLYCOLAX) packet 17 g  17 g Oral BID Elodia Florence., MD   17 g at 01/03/19 2204  . rosuvastatin (CRESTOR) tablet 20 mg  20 mg Oral Daily Milus Banister, MD   20 mg at 01/04/19 [id number]  . tamsulosin (FLOMAX) capsule 0.4 mg  0.4 mg Oral Daily Elodia Florence., MD   0.4 mg at 01/04/19 [id number]     Discharge Medications: Please see discharge summary for a list of discharge medications.  Relevant Imaging Results:  Relevant Lab Results:   Additional Information SSN: [id number]  Alberteen Sam, LCSW

## 2019-01-04 NOTE — Progress Notes (Signed)
Physical Therapy Treatment Patient Details Name: Diana [name] MRN: [id number] DOB: 08/16/1933 Today's Date: 01/04/2019    History of Present Illness 85 y.o. female admitted on 12/30/18 for fall with symptomatic anemia s/p 2 units PRBCs, hypoglycemia, A-fib, and rib fractures L anterior 6-9.  Pt with other significant PMH of DM2, renal insufficiency, obesity, HTN, breast CA, anemia (due to poor B12 absorption), back surgery, bowel resection, bil mastectomy.      PT Comments    Pt limited by fatigue during session, often reporting that she is not very mobile at home, but does report that she mobilizes herself in manual wheelchair. Pt requires physical assistance and use of RW to perform all OOB mobility at this time, with generalized LE weakness limiting her capacity for ambulation. Pt participates in seated HEP upon completion of mobility with good form and will benefit from continued performance of HEP to improve LE strength and endurance deficits.   Follow Up Recommendations  SNF     Equipment Recommendations  None recommended by PT    Recommendations for Other Services       Precautions / Restrictions Precautions Precautions: Fall Precaution Comments: 2 falls in the past week trying to get to her WC Restrictions Weight Bearing Restrictions: No    Mobility  Bed Mobility Overal bed mobility: Needs Assistance Bed Mobility: Supine to Sit     Supine to sit: Supervision;HOB elevated        Transfers Overall transfer level: Needs assistance Equipment used: Rolling walker (2 wheeled) Transfers: Sit to/from Stand Sit to Stand: Min assist            Ambulation/Gait Ambulation/Gait assistance: Min Web designer (Feet): 5 Feet Assistive device: Rolling walker (2 wheeled) Gait Pattern/deviations: Step-to pattern;Shuffle Gait velocity: reduced Gait velocity interpretation: <1.31 ft/sec, indicative of household ambulator General Gait Details: pt with short  shuffling pattern with increased trunk flexion   Stairs             Wheelchair Mobility    Modified Rankin (Stroke Patients Only)       Balance Overall balance assessment: Needs assistance Sitting-balance support: Bilateral upper extremity supported;Feet supported Sitting balance-Leahy Scale: Good Sitting balance - Comments: supervision of 1 person   Standing balance support: Bilateral upper extremity supported Standing balance-Leahy Scale: Fair Standing balance comment: minG with BUE support of RW                            Cognition Arousal/Alertness: Awake/alert Behavior During Therapy: WFL for tasks assessed/performed Overall Cognitive Status: Within Functional Limits for tasks assessed                                        Exercises General Exercises - Lower Extremity Ankle Circles/Pumps: AROM;Both;20 reps Gluteal Sets: AROM;Both;10 reps Long Arc Quad: AROM;Both;10 reps Hip Flexion/Marching: AROM;Both;10 reps    General Comments        Pertinent Vitals/Pain Pain Assessment: No/denies pain    Home Living                      Prior Function            PT Goals (current goals can now be found in the care plan section) Acute Rehab PT Goals Patient Stated Goal: to feel better, get back home Progress towards PT goals: Progressing  toward goals    Frequency    Min 3X/week      PT Plan Current plan remains appropriate    Co-evaluation              AM-PAC PT "6 Clicks" Mobility   Outcome Measure  Help needed turning from your back to your side while in a flat bed without using bedrails?: None Help needed moving from lying on your back to sitting on the side of a flat bed without using bedrails?: None Help needed moving to and from a bed to a chair (including a wheelchair)?: A Little Help needed standing up from a chair using your arms (e.g., wheelchair or bedside chair)?: A Little Help needed to walk  in hospital room?: A Lot Help needed climbing 3-5 steps with a railing? : Total 6 Click Score: 17    End of Session Equipment Utilized During Treatment: Gait belt Activity Tolerance: Patient limited by fatigue Patient left: in chair;with call bell/phone within reach;with chair alarm set Nurse Communication: Mobility status PT Visit Diagnosis: Muscle weakness (generalized) (M62.81);Repeated falls (R29.6)     Time: [id number] PT Time Calculation (min) (ACUTE ONLY): 23 min  Charges:  $Therapeutic Exercise: 8-22 mins $Therapeutic Activity: 8-22 mins                     Zenaida Niece, PT, DPT Acute Rehabilitation Pager: [phone]  Zenaida Niece 01/04/2019, 12:49 PM

## 2019-01-04 NOTE — TOC Benefit Eligibility Note (Signed)
Transition of Care [facility]) Benefit Eligibility Note    Patient Details  Name: Diana [name] MRN: [id number] Date of Birth: 05/13/1933   Medication/Dose: Arne Cleveland 5 MG BID  Covered?: Yes  Tier: 2 Drug  Prescription Coverage Preferred Pharmacy: [facility]  ,   90 DAY SUPPLY FOR RETAIL OR M/O $ 80.00  Spoke with Person/Company/Phone Number:: FRANCES  @ [employer] AN #  [phone]  Co-Pay: $40.00  Prior Approval: No  Deductible: Met       Memory Argue Phone Number: 01/04/2019, 1:05 PM

## 2019-01-04 NOTE — Progress Notes (Addendum)
PROGRESS NOTE    Diana [name]  [id number] DOB: [birth year] DOA: 12/30/2018 PCP: Diana Shorter, PA-C   Brief Narrative:  Diana [name] is Diana [name] 85 y.o. female with history of diabetes mellitus type 2 on insulin, hypertension, hypothyroidism, history of breast cancer in remission, sleep apnea had Diana [name] fall at home and was found to be very weak and patient states she called her neighbor and eventually EMS was called.  On arrival patient was found to be weak and mildly confused and when EMS checked her blood sugar it was around 44 patient was given D50 following which blood sugar improved more than 100 and patient was brought to the ER.  Patient states she has been having frequent falls recently most of the time because she feels weak.  Not sure if she lost consciousness.  Denies hitting her head.  During one of the falls 2 days ago she had Diana [name] left side of the chest and since been hurting her.  Denies any difficulty breathing of any nausea vomiting or diarrhea but has been having some epigastric discomfort with Diana [name] poor appetite last few weeks.  ED Course: In the ER patient was afebrile.  Labs show creatinine 1.1 glucose 130 albumin 2.7 total protein 5.6 hemoglobin was 5.2 WBC 6.7 platelets 402.  COVID-19 test was negative stool for occult blood was negative.  Patient had CT head CT C-spine CT chest CT abdomen and pelvis which showed left-sided rib fracture with some continuation of the left chest wall.  Trauma service was consulted.  Given the multiple medical issues including severe anemia hypoglycemic episodes patient is being admitted under medical service.  EKG shows Diana [name]. fib rate controlled.  This happened to be new.  She was admitted for recurrent falls, hypoglycemia, and generalized weakness.  She was found to have significant anemia and was transfused.  She had severe ulcerative esophagitis on endoscopy and is now on Diana [name] PPI BID. She was noted to have slurred speech on hospital day 1 and  worked up for stroke which was negative.  She was found to have Diana [name] UTI and is being treated with keflex.  She was also noted to have new onset Diana [name] fib and is now on anticoagulation.  Discharge currently pending SNF placement.  Assessment & Plan:   Principal Problem:   Symptomatic anemia Active Problems:   Breast cancer (HCC)   HTN (hypertension)   OSA on CPAP   Hypothyroidism   DM2 (diabetes mellitus, type 2) (HCC)   Hypoglycemia   Ribs, multiple fractures   Hiatal hernia   Anemia   Persistent atrial fibrillation (Diana [name])   1. Acute Metabolic Encephalopathy with Slurred Speech and word finding difficulties  Urinary tract infection: Seems to have word finding difficulties and slurred speech.  No other obvious neuro deficits on exam, but she's had falls over the past week.  She notes that this (speech trouble) started about 1 week ago, not clear how accurate this is (spoke to her sister in law who hasn't seen her in Diana [name] while, but said her housekeeper Diana [name], said things were ok on Monday - she noticed increased falls earlier this week, but did not notice any change in speech until yesterday).  Concerning for CVA, but w/u negative - UA concerning for UTI - will treat. 1. PT/OT/SLP -> recommending SNF/HH SLP 2. Echo as below, Carotid dopplers (1-39% stenosis bilaterally), MRI/Diana [name] brain without acute infarct - MRA degraded by motion, but suspect underlying intracranial atherosclerotic disease "which could be severe". 3.  Neurology c/s - recommending outpatient neurology follow up and treating UTI and avoiding sedating meds 4. UTI with klebsiella, will transition to keflex - plan for 7 days total therapy  2. Symptomatic anemia - seems to be most likely cause of her recent falls and weakness.  No obvous GI bleeding.  Negative FOBT. 1. Transfuse 2 units pRBC 2. Follow post transfusion H/H - relatively stable H/H today. 3. Labs appear c/w iron def anemia and AOCD 4. GI c/s, appreciate recs - EGD with severe  ulcerative esophagitis, GE junction stricture -> biopsied (esophageal squamous and cardiac mucosa with active nonspecific carditis and focal intestinal metaplasia - negative for dysplasia - ddx includes reflux vs drug effect - see report).  PPI BID indefinitely.  Feraheme, iron.  Needs f/u with GI in 6-8 weeks.  Discussed with Diana [name] who notes ok with starting anticoagulation in house.  3. Hypoglycemia  T2DM, now hyperglycemia - most likely 2/2 insulin with her amaryl.  Will hold both and follow BG q6.  1. Follow BG's - decrease lantus 5units nightly and SSI, continue to monitor. 2. Follow A1c 5.2 (though with anemia) and TSH (wnl).  Hold off on checking cortisol for now.  4. New onset Diana [name]. Fib - Chadsvasc 5.  Has now been started on anticoagulation. 1. Follow echo (EF 60-65%, see below), TSH (wnl) 2. Bradycardia, will hold verapamil and follow 3. Transition to eliquis - Hb has been stable on heparin 4. Appreciate cardiology c/s - verapamil decreased to 180, stop losartan   5. Left-sided rib fracture status post fall being followed by trauma surgery.  Pain control, IS, pulm toilet, PT.   6. Hypothyroidism on Synthroid check TSH (wnl).  PO synthroid.  7. Hypertension - verapamil and losartan   8. Abnormal left kidney lesion seen in the CAT scan which will need follow-up.  9. Large hiatal hernia seen in the CAT scan.  Will need follow-up.  10. Sleep apnea on CPAP.  11. History of breast cancer in remission.  12. Severe protein calorie malnutrition will need nutrition consult.  13. Urinary Retention: will need foley catheter if this continues today.  Flomax.  Bladder scans.  14. CKD III: baseline creatinine ~1.3.  Fluctuating here.  Follow.  15. Hyponatremia: will trial NS x 1 day    DVT prophylaxis: SCD Code Status: full  Family Communication: none at bedside Disposition Plan: pending further improvement - she'll need SNF   Consultants:   GI  Cardiology  Trauma  surgery  Procedures:  Echo IMPRESSIONS    1. Left ventricular ejection fraction, by visual estimation, is 60 to 65%. The left ventricle has normal function. Normal left ventricular size. There is no left ventricular hypertrophy.  2. Left ventricular diastolic function could not be evaluated pattern of LV diastolic filling.  3. Global right ventricle has normal systolic function.The right ventricular size is not well visualized. No increase in right ventricular wall thickness.  4. Left atrial size was normal.  5. Right atrial size was not well visualized.  6. Moderate mitral annular calcification.  7. The mitral valve was not well visualized. Trace mitral valve regurgitation. No evidence of mitral stenosis.  8. The tricuspid valve is normal in structure. Tricuspid valve regurgitation was not visualized by color flow Doppler.  9. The aortic valve is normal in structure. Aortic valve regurgitation was not visualized by color flow Doppler. Structurally normal aortic valve, with no evidence of sclerosis or stenosis. 10. The pulmonic valve was not well visualized. Pulmonic  valve regurgitation is not visualized by color flow Doppler. 11. TR signal is inadequate for assessing pulmonary artery systolic pressure.  eeg IMPRESSION: This study is suggestive of moderate diffuse encephalopathy, nonspecific to etiology. No seizures or epileptiform discharges were seen throughout the recording.  Carotid US Summary: Right Carotid: Velocities in the right ICA are consistent with Shelvia Fojtik 1-39% stenosis.  Left Carotid: Velocities in the left ICA are consistent with Stevenson Windmiller 1-39% stenosis.  Vertebrals: Bilateral vertebral arteries demonstrate antegrade flow.  Antimicrobials:  Anti-infectives (From admission, onward)   Start     Dose/Rate Route Frequency Ordered Stop   01/02/19 1800  cefTRIAXone (ROCEPHIN) 1 g in sodium chloride 0.9 % 100 mL IVPB    Note to Pharmacy: Give after urine culture has been collected    1 g 200 mL/hr over 30 Minutes Intravenous Every 24 hours 01/02/19 1549        Subjective: No complaints today  Objective: Vitals:   01/04/19 0200 01/04/19 0300 01/04/19 0505 01/04/19 0800  BP:   (!) 103/47 (!) 124/55  Pulse: 63 (!) 59 61 64  Resp: (!) 24 (!) 24 19   Temp:   98.3 F (36.8 C) 98.2 F (36.8 C)  TempSrc:   Oral Oral  SpO2: 93% 95% 95% 97%  Weight:   90.9 kg   Height:        Intake/Output Summary (Last 24 hours) at 01/04/2019 1040 Last data filed at 01/04/2019 0800 Gross per 24 hour  Intake 451.72 ml  Output 750 ml  Net -298.28 ml   Filed Weights   01/01/19 0818 01/04/19 0505  Weight: 86.2 kg 90.9 kg    Examination:  General: No acute distress. Cardiovascular: Heart sounds show Leven Hoel regular rate, and rhythm.  Lungs: Clear to auscultation bilaterally. Abdomen: Soft, nontender, nondistended Neurological: Alert.  Moves all extremities 4. Cranial nerves II through XII grossly intact. Skin: Warm and dry. No rashes or lesions. Extremities: No clubbing or cyanosis. No edema.   Data Reviewed: I have personally reviewed following labs and imaging studies  CBC: Recent Labs  Lab 12/30/18 2246 12/31/18 1844 01/01/19 0319 01/02/19 0350 01/02/19 1644 01/03/19 0523 01/03/19 1906 01/04/19 0330  WBC 6.7 8.8 7.7 9.2  --  8.1  --  9.4  NEUTROABS 5.4  --   --   --   --   --   --   --   HGB 5.2* 8.8* 8.1* 7.5* 7.5* 7.2* 7.9* 7.5*  HCT 20.0* 30.4* 26.8* 25.4* 25.2* 24.7* 27.0* 26.0*  MCV 62.9* 69.7* 68.9* 70.4*  --  71.4*  --  72.6*  PLT 402* 379 321 336  --  314  --  [id number]   Basic Metabolic Panel: Recent Labs  Lab 12/31/18 1844 01/01/19 0319 01/02/19 0350 01/03/19 0523 01/04/19 0330  NA 133* 131* 133* 133* 128*  K 3.8 4.2 3.9 4.1 4.4  CL 100 102 103 103 99  CO2 22 19* 23 21* 21*  GLUCOSE 209* 230* 95 129* 172*  BUN 19 19 19 19 22   CREATININE 1.43* 1.32* 1.23* 1.44* 1.48*  CALCIUM 7.9* 7.8* 8.1* 8.4* 8.3*  MG  --  1.8 1.9 1.9 1.8   GFR:  Estimated Creatinine Clearance: 34 mL/min (Loribeth Katich) (by C-G formula based on SCr of 1.48 mg/dL (H)). Liver Function Tests: Recent Labs  Lab 12/31/18 1844 01/01/19 0319 01/02/19 0350 01/03/19 0523 01/04/19 0330  AST 18 21 19 19 15   ALT 18 16 16 17 16   ALKPHOS 74 63 57 53  58  BILITOT 1.7* 1.2 0.8 0.5 0.7  PROT 5.6* 5.0* 4.9* 4.8* 4.9*  ALBUMIN 2.6* 2.3* 2.2* 2.1* 2.0*   No results for input(s): LIPASE, AMYLASE in the last 168 hours. No results for input(s): AMMONIA in the last 168 hours. Coagulation Profile: No results for input(s): INR, PROTIME in the last 168 hours. Cardiac Enzymes: No results for input(s): CKTOTAL, CKMB, CKMBINDEX, TROPONINI in the last 168 hours. BNP (last 3 results) No results for input(s): PROBNP in the last 8760 hours. HbA1C: No results for input(s): HGBA1C in the last 72 hours. CBG: Recent Labs  Lab 01/02/19 2157 01/03/19 1211 01/03/19 1658 01/03/19 2115 01/04/19 0624  GLUCAP 192* 172* 247* 171* 139*   Lipid Profile: No results for input(s): CHOL, HDL, LDLCALC, TRIG, CHOLHDL, LDLDIRECT in the last 72 hours. Thyroid Function Tests: No results for input(s): TSH, T4TOTAL, FREET4, T3FREE, THYROIDAB in the last 72 hours. Anemia Panel: No results for input(s): VITAMINB12, FOLATE, FERRITIN, TIBC, IRON, RETICCTPCT in the last 72 hours. Sepsis Labs: No results for input(s): PROCALCITON, LATICACIDVEN in the last 168 hours.  Recent Results (from the past 240 hour(s))  SARS CORONAVIRUS 2 (TAT 6-24 HRS) Nasopharyngeal Nasopharyngeal Swab     Status: None   Collection Time: 12/30/18 11:32 PM   Specimen: Nasopharyngeal Swab  Result Value Ref Range Status   SARS Coronavirus 2 NEGATIVE NEGATIVE Final    Comment: (NOTE) SARS-CoV-2 target nucleic acids are NOT DETECTED. The SARS-CoV-2 RNA is generally detectable in upper and lower respiratory specimens during the acute phase of infection. Negative results do not preclude SARS-CoV-2 infection, do not rule out  co-infections with other pathogens, and should not be used as the sole basis for treatment or other patient management decisions. Negative results must be combined with clinical observations, patient history, and epidemiological information. The expected result is Negative. Fact Sheet for Patients: SugarRoll.be Fact Sheet for Healthcare Providers: https://www.woods-mathews.com/ This test is not yet approved or cleared by the [place] FDA and  has been authorized for detection and/or diagnosis of SARS-CoV-2 by FDA under an Emergency Use Authorization (EUA). This EUA will remain  in effect (meaning this test can be used) for the duration of the COVID-19 declaration under Section 56 4(b)(1) of the Act, 21 U.S.C. section 360bbb-3(b)(1), unless the authorization is terminated or revoked sooner. Performed at [facility], [place] [address], [place], [place] [address]   Culture, Urine     Status: Abnormal   Collection Time: 01/02/19  7:46 AM   Specimen: Urine, Random  Result Value Ref Range Status   Specimen Description URINE, RANDOM  Final   Special Requests   Final    NONE Performed at [facility], [place] [address], [place], [place] [address]    Culture 80,000 COLONIES/mL KLEBSIELLA PNEUMONIAE (Adali Pennings)  Final   Report Status 01/04/2019 FINAL  Final   Organism ID, Bacteria KLEBSIELLA PNEUMONIAE (Sae Handrich)  Final      Susceptibility   Klebsiella pneumoniae - MIC*    AMPICILLIN >=32 RESISTANT Resistant     CEFAZOLIN <=4 SENSITIVE Sensitive     CEFTRIAXONE <=1 SENSITIVE Sensitive     CIPROFLOXACIN <=0.25 SENSITIVE Sensitive     GENTAMICIN <=1 SENSITIVE Sensitive     IMIPENEM 2 SENSITIVE Sensitive     NITROFURANTOIN 32 SENSITIVE Sensitive     TRIMETH/SULFA <=20 SENSITIVE Sensitive     AMPICILLIN/SULBACTAM <=2 SENSITIVE Sensitive     PIP/TAZO <=4 SENSITIVE Sensitive     Extended ESBL NEGATIVE Sensitive     *  80,000 [place]         Radiology Studies: No results found.      Scheduled Meds: . sodium chloride   Intravenous Once  . brimonidine  1 drop Both Eyes BID   And  . timolol  1 drop Both Eyes BID  . feeding supplement (GLUCERNA SHAKE)  237 mL Oral TID BM  . FLUoxetine  20 mg Oral Daily  . insulin aspart  0-5 Units Subcutaneous QHS  . insulin aspart  0-9 Units Subcutaneous TID WC  . insulin glargine  5 Units Subcutaneous QHS  . levothyroxine  50 mcg Oral Daily  . multivitamin with minerals  1 tablet Oral Daily  . pantoprazole  40 mg Oral BID AC  . polyethylene glycol  17 g Oral BID  . rosuvastatin  20 mg Oral Daily  . tamsulosin  0.4 mg Oral Daily  . verapamil  120 mg Oral QHS   Continuous Infusions: . cefTRIAXone (ROCEPHIN)  IV 1 g (01/03/19 1753)  . heparin 1,000 Units/hr (01/03/19 2038)     LOS: 3 days    Time spent: over 30 min    Fayrene Helper, MD [facility] Pager AMION  If 7PM-7AM, please contact night-coverage [email or web address] Password [email or web address] 01/04/2019, 10:40 AM

## 2019-01-04 NOTE — Progress Notes (Signed)
Pt states she doesn't use her CPAP at home and does not wish to use the CPAP here tonight.  Pt is on RA Sp02 95%. NO distress noted.

## 2019-01-04 NOTE — Progress Notes (Addendum)
[place] for Heparin  Indication: atrial fibrillation  Allergies  Allergen Reactions  . Codeine Sulfate Nausea Only    Patient Measurements: Height: 5\' 8"  (172.7 cm) Weight: 200 lb 6.4 oz (90.9 kg) IBW/kg (Calculated) : 63.9 Heparin Dosing Weight: 81kg  Vital Signs: Temp: 98.3 F (36.8 C) (10/27 0505) Temp Source: Oral (10/27 0505) BP: 103/47 (10/27 0505) Pulse Rate: 61 (10/27 0505)  Labs: Recent Labs    01/02/19 0350  01/03/19 0523 01/03/19 1906 01/04/19 0330  HGB 7.5*   < > 7.2* 7.9* 7.5*  HCT 25.4*   < > 24.7* 27.0* 26.0*  PLT 336  --  314  --  313  HEPARINUNFRC  --   --  0.56  --  0.42  CREATININE 1.23*  --  1.44*  --  1.48*   < > = values in this interval not displayed.    Estimated Creatinine Clearance: 34 mL/min (A) (by C-G formula based on SCr of 1.48 mg/dL (H)).   Medical History: Past Medical History:  Diagnosis Date  . Anemia   . Anemia of decreased vitamin B12 absorption 05/26/2005  . Arthritis   . Breast cancer ([place]) 2001  . Cataract   . History of tobacco abuse   . Hx of colonic polyps   . Hyperlipidemia   . Hypertension   . Hypothyroidism   . Obesity   . OSA on CPAP 09/2007   AHI 10.6/hr overall, 43.64/hr during REM  . Osteopenia   . Renal insufficiency   . Type 2 diabetes mellitus (HCC)     Medications:  Medications Prior to Admission  Medication Sig Dispense Refill Last Dose  . Apoaequorin (PREVAGEN PO) Take 1 capsule by mouth daily.   12/29/2018  . aspirin 81 MG tablet Take 81 mg by mouth 2 (two) times daily.    12/29/2018  . COMBIGAN 0.2-0.5 % ophthalmic solution Place 1 drop into both eyes every 12 (twelve) hours.    Past Month at Unknown time  . FLUoxetine (PROZAC) 20 MG capsule Take 20 mg by mouth daily.    12/29/2018  . glimepiride (AMARYL) 2 MG tablet Take 2 mg by mouth daily.    12/29/2018  . insulin glargine (LANTUS) 100 UNIT/ML injection Inject 20 Units into the skin at bedtime.  Sliding scale   12/29/2018  . levothyroxine (SYNTHROID, LEVOTHROID) 50 MCG tablet Take 50 mcg by mouth daily.     12/29/2018 at Unknown time  . losartan (COZAAR) 100 MG tablet Take 1 tablet (100 mg total) by mouth daily. NEEDS APPOINTMENT FOR FUTURE REFILLS (Patient taking differently: Take 100 mg by mouth daily. ) 30 tablet 0 12/29/2018  . Multiple Vitamins-Minerals (PRESERVISION AREDS 2) CAPS Take 2 capsules by mouth daily.   12/29/2018  . rosuvastatin (CRESTOR) 20 MG tablet Take 20 mg by mouth daily.   12/29/2018  . traMADol (ULTRAM) 50 MG tablet Take 1 tablet (50 mg total) by mouth every 6 (six) hours as needed for moderate pain. (Patient taking differently: Take 50 mg by mouth 3 (three) times daily as needed for moderate pain. ) 20 tablet 0 Past Month at Unknown time  . verapamil (CALAN-SR) 240 MG CR tablet Take 1 tablet (240 mg total) by mouth at bedtime. Please keep upcoming appt for further refills. 90 tablet 0 12/29/2018   Scheduled:  . sodium chloride   Intravenous Once  . brimonidine  1 drop Both Eyes BID   And  . timolol  1 drop  Both Eyes BID  . feeding supplement (GLUCERNA SHAKE)  237 mL Oral TID BM  . FLUoxetine  20 mg Oral Daily  . insulin aspart  0-5 Units Subcutaneous QHS  . insulin aspart  0-9 Units Subcutaneous TID WC  . insulin glargine  5 Units Subcutaneous QHS  . levothyroxine  50 mcg Oral Daily  . multivitamin with minerals  1 tablet Oral Daily  . pantoprazole  40 mg Oral BID AC  . polyethylene glycol  17 g Oral BID  . rosuvastatin  20 mg Oral Daily  . tamsulosin  0.4 mg Oral Daily  . verapamil  120 mg Oral QHS    Assessment: 85 yo female with new onset afib (CHADSVASC= 4 ) and pharmacy consulted to dose heparin. GI was seeing for anemia and she is now s/p EGD with severe ulcerative esophagitis and biopsy on 10/24, hospitalist discussed with GI who agreed to IV heparin to assess tolerance of anticoagulation.  Heparin level remains therapeutic at 0.42, H/H is low  but stable, no active bleeding noted. Heparin infusion off briefly with loss of access but resumed.   Goal of Therapy:  Heparin level 0.3-0.7 units/ml Monitor platelets by anticoagulation protocol: Yes   Plan:  -Continue heparin at 1000 units/hr -Daily heparin level and CBC  ADDENDUM: Transitioning pt to apixaban for new AFib. Cr is borderline at 1.48 but baseline appears closer to 1.3.  Plan: Stop IV heparin Apixaban 5mg  BID   Arrie Senate, PharmD, BCPS Clinical Pharmacist [phone] Please check AMION for all [place] numbers 01/04/2019

## 2019-01-04 NOTE — Discharge Instructions (Signed)

## 2019-01-04 NOTE — Plan of Care (Signed)
  Problem: Clinical Measurements: Goal: Respiratory complications will improve Outcome: Progressing   

## 2019-01-04 NOTE — TOC Initial Note (Signed)
Transition of Care [facility]) - Initial/Assessment Note    Patient Details  Name: XEE GOARD MRN: [id number] Date of Birth: Jun 12, 1933  [facility] CM/SW Contact:    Vinie Sill, [place] Phone Number: 01/04/2019, 1:56 PM  Clinical Narrative:                  CSW visit with the patient at bedside. CSW introduced self and explained role. Patient states she lives home alone. CSW discussed PT recommendation of [facility] at [facility]. Patient was agreeable to discharge to SNF for ST rehab before going home. Patient states no preferred SNF. CSW was given permission to send referrals to SNFs.   Patient gave CSW permission to speak with her sister-in-law, Lelon Frohlich, regarding placement options. Lelon Frohlich, suggested [place] or somewhere near the hospital.   CSW will provide bed offers once available. CSW will continue to follow and assist with discharge planning.  Thurmond Butts, MSW, [facility] Clinical Social Worker [phone]    Expected Discharge Plan: Skilled Nursing Facility Barriers to Discharge: SNF Pending bed offer, Insurance Authorization, Continued Medical Work up   Patient Goals and CMS Choice        Expected Discharge Plan and Services Expected Discharge Plan: [place] In-house Referral: Clinical Social Work     Living arrangements for the past 2 months: [place]                                      Prior Living Arrangements/Services Living arrangements for the past 2 months: Single Family Home Lives with:: Self Patient language and need for interpreter reviewed:: No        Need for Family Participation in Patient Care: Yes (Comment) Care giver support system in place?: Yes (comment)   Criminal Activity/Legal Involvement Pertinent to Current Situation/Hospitalization: No - Comment as needed  Activities of Daily Living      Permission Sought/Granted Permission sought to share information with : Family Supports,  Customer service manager, Case Optician, dispensing granted to share information with : Yes, Verbal Permission Granted  Share Information with NAME: Ulyses Jarred  Permission granted to share info w AGENCY: SNFs  Permission granted to share info w Relationship: sister in law  Permission granted to share info w Contact Information: [phone]  Emotional Assessment Appearance:: Appears stated age Attitude/Demeanor/Rapport: Engaged Affect (typically observed): Anxious, Appropriate Orientation: : Oriented to Self, Oriented to Place, Oriented to  Time, Oriented to Situation Alcohol / Substance Use: Not Applicable Psych Involvement: No (comment)  Admission diagnosis:  Hypoglycemia [E16.2] Closed fracture of multiple ribs of left side, initial encounter [S22.42XA] Anemia, unspecified type [D64.9] Symptomatic anemia [D64.9] Patient Active Problem List   Diagnosis Date Noted  . Persistent atrial fibrillation ([place])   . Anemia 01/01/2019  . Hiatal hernia   . Symptomatic anemia 12/31/2018  . Hypoglycemia 12/31/2018  . Ribs, multiple fractures 12/31/2018  . HTN (hypertension) 05/15/2013  . OSA on CPAP 05/15/2013  . Hypothyroidism 05/15/2013  . DM2 (diabetes mellitus, type 2) ([place]) 05/15/2013  . Lower extremity edema 05/15/2013  . Renal insufficiency 05/15/2013  . Morbid obesity ([place]) 05/15/2013  . Breast cancer ([place]) 03/18/2011   PCP:  [name], PA-C Pharmacy:   [facility] [address] [place] [address] [place] [address] Phone: [phone] Fax: [phone]     Social Determinants of Health (  SDOH) Interventions    Readmission Risk Interventions No flowsheet data found.

## 2019-01-05 DIAGNOSIS — Y92009 Unspecified place in unspecified non-institutional (private) residence as the place of occurrence of the external cause: Secondary | ICD-10-CM

## 2019-01-05 DIAGNOSIS — W19XXXA Unspecified fall, initial encounter: Secondary | ICD-10-CM

## 2019-01-05 DIAGNOSIS — E119 Type 2 diabetes mellitus without complications: Secondary | ICD-10-CM

## 2019-01-05 DIAGNOSIS — E039 Hypothyroidism, unspecified: Secondary | ICD-10-CM

## 2019-01-05 DIAGNOSIS — N390 Urinary tract infection, site not specified: Secondary | ICD-10-CM | POA: Diagnosis not present

## 2019-01-05 DIAGNOSIS — D500 Iron deficiency anemia secondary to blood loss (chronic): Secondary | ICD-10-CM

## 2019-01-05 DIAGNOSIS — I10 Essential (primary) hypertension: Secondary | ICD-10-CM | POA: Diagnosis not present

## 2019-01-05 DIAGNOSIS — I480 Paroxysmal atrial fibrillation: Secondary | ICD-10-CM

## 2019-01-05 DIAGNOSIS — D649 Anemia, unspecified: Secondary | ICD-10-CM | POA: Diagnosis not present

## 2019-01-05 DIAGNOSIS — B9689 Other specified bacterial agents as the cause of diseases classified elsewhere: Secondary | ICD-10-CM

## 2019-01-05 DIAGNOSIS — S2242XD Multiple fractures of ribs, left side, subsequent encounter for fracture with routine healing: Secondary | ICD-10-CM

## 2019-01-05 LAB — COMPREHENSIVE METABOLIC PANEL
ALT: 23 U/L (ref 0–44)
AST: 28 U/L (ref 15–41)
Albumin: 1.9 g/dL — ABNORMAL LOW (ref 3.5–5.0)
Alkaline Phosphatase: 62 U/L (ref 38–126)
Anion gap: 7 (ref 5–15)
BUN: 26 mg/dL — ABNORMAL HIGH (ref 8–23)
CO2: 21 mmol/L — ABNORMAL LOW (ref 22–32)
Calcium: 8.1 mg/dL — ABNORMAL LOW (ref 8.9–10.3)
Chloride: 103 mmol/L (ref 98–111)
Creatinine, Ser: 1.58 mg/dL — ABNORMAL HIGH (ref 0.44–1.00)
GFR calc Af Amer: 35 mL/min — ABNORMAL LOW (ref >60)
GFR calc non Af Amer: 30 mL/min — ABNORMAL LOW (ref >60)
Glucose, Bld: 240 mg/dL — ABNORMAL HIGH (ref 70–99)
Potassium: 4.9 mmol/L (ref 3.5–5.1)
Sodium: 131 mmol/L — ABNORMAL LOW (ref 135–145)
Total Bilirubin: 0.2 mg/dL — ABNORMAL LOW (ref 0.3–1.2)
Total Protein: 4.6 g/dL — ABNORMAL LOW (ref 6.5–8.1)

## 2019-01-05 LAB — CBC
HCT: 25.7 % — ABNORMAL LOW (ref 36.0–46.0)
Hemoglobin: 7.3 g/dL — ABNORMAL LOW (ref 12.0–15.0)
MCH: 21.2 pg — ABNORMAL LOW (ref 26.0–34.0)
MCHC: 28.4 g/dL — ABNORMAL LOW (ref 30.0–36.0)
MCV: 74.5 fL — ABNORMAL LOW (ref 80.0–100.0)
Platelets: 301 10*3/uL (ref 150–400)
RBC: 3.45 MIL/uL — ABNORMAL LOW (ref 3.87–5.11)
RDW: 29.7 % — ABNORMAL HIGH (ref 11.5–15.5)
WBC: 6.9 10*3/uL (ref 4.0–10.5)
nRBC: 0 % (ref 0.0–0.2)

## 2019-01-05 LAB — MAGNESIUM: Magnesium: 1.7 mg/dL (ref 1.7–2.4)

## 2019-01-05 LAB — GLUCOSE, CAPILLARY
Glucose-Capillary: 208 mg/dL — ABNORMAL HIGH (ref 70–99)
Glucose-Capillary: 212 mg/dL — ABNORMAL HIGH (ref 70–99)
Glucose-Capillary: 173 mg/dL — ABNORMAL HIGH (ref 70–99)
Glucose-Capillary: 220 mg/dL — ABNORMAL HIGH (ref 70–99)

## 2019-01-05 MED ORDER — APIXABAN 2.5 MG PO TABS
2.5000 mg | ORAL_TABLET | Freq: Two times a day (BID) | ORAL | Status: DC
  Administered 2019-01-05 – 2019-01-07 (×4): 2.5 mg via ORAL
  Filled 2019-01-05 (×4): qty 1

## 2019-01-05 MED ORDER — INSULIN GLARGINE 100 UNIT/ML ~~LOC~~ SOLN
8.0000 [IU] | Freq: Every day | SUBCUTANEOUS | Status: DC
  Administered 2019-01-05 – 2019-01-06 (×2): 8 [IU] via SUBCUTANEOUS
  Filled 2019-01-05 (×3): qty 0.08

## 2019-01-05 NOTE — Plan of Care (Signed)
Poc progressing.  

## 2019-01-05 NOTE — Progress Notes (Signed)
  ANTICOAGULATION CONSULT NOTE - Follow Up Consult  Pharmacy Consult for Apixaban Indication: atrial fibrillation (CHADS2VASc = 5)  Allergies  Allergen Reactions  . Codeine Sulfate Nausea Only    Patient Measurements: Height: 5\' 8"  (172.7 cm) Weight: 200 lb 6.4 oz (90.9 kg) IBW/kg (Calculated) : 63.9   Vital Signs: Temp: 98 F (36.7 C) (10/28 1156) Temp Source: Oral (10/28 1156) BP: 123/71 (10/28 1156) Pulse Rate: 68 (10/28 1156)  Labs: Recent Labs    01/03/19 0523 01/03/19 1906 01/04/19 0330 01/05/19 0403  HGB 7.2* 7.9* 7.5* 7.3*  HCT 24.7* 27.0* 26.0* 25.7*  PLT 314  --  313 301  HEPARINUNFRC 0.56  --  0.42  --   CREATININE 1.44*  --  1.48* 1.58*    Estimated Creatinine Clearance: 31.8 mL/min (A) (by C-G formula based on SCr of 1.58 mg/dL (H)).  Assessment: 85 yo W with new Afib started on apixaban. Patient's renal function is improving slowly but borderline for apixaban adjustment. H/H stable, no documentation of bleeding. Given age, recurrent falls, chronic anemia, and renal function will decrease dose. Hospitalist may DC apixaban at discharge.     Plan:  -Decrease apixaban to 2.5mg  BID  Benetta Spar, PharmD, BCPS, [facility] Clinical Pharmacist  Please check AMION for all [place] phone numbers After 10:00 PM, call [place] [phone]

## 2019-01-05 NOTE — Progress Notes (Signed)
PROGRESS NOTE  Diana [name] [id number] DOB: 1933/09/03   PCP: [name], PA-C  Patient is from: Home  DOA: 12/30/2018 LOS: 4  Brief Narrative / Interim history: 85 year old female with history of DM-2, HTN, hypothyroidism, breast cancer in remission, OSA on CPAP and anemia presenting with generalized weakness and fall at home.  Mildly confused and hypoglycemic to 44 when EMS arrived.  In ED, Hgb 5.  2.  COVID-19 negative.  FOBT negative.  EKG revealed rate controlled A. fib.  CT head/cervical spine/chest/abdomen/pelvis revealed left-sided rib fractures with contusion of left chest wall but no other acute finding.  Trauma consulted.  She was admitted for recurrent falls, hypoglycemia, and generalized weakness.  She was found to have significant anemia and was transfused.  She had severe ulcerative esophagitis on endoscopy and is now on a PPI BID. She was noted to have slurred speech on hospital day 1 and worked up for stroke which was negative.  She was found to have a UTI and is being treated with keflex.  She was also noted to have new onset a fib and is now on anticoagulation.  Discharge currently pending SNF placement.  Subjective: No major events overnight of this morning.  No complaint this morning.  Sitting in bed trying to eat her breakfast.  Denies chest pain, shortness of breath, cough, nausea, vomiting or abdominal pain but not a reliable historian.  She is alert and awake but only oriented to self.  Objective: Vitals:   01/04/19 1620 01/04/19 2200 01/05/19 0337 01/05/19 1156  BP: 134/67 137/63 (!) 134/57 123/71  Pulse: 69 71 73 68  Resp: (!) 22 17 17 20   Temp: 98 F (36.7 C) 97.8 F (36.6 C) 98 F (36.7 C) 98 F (36.7 C)  TempSrc: Oral Oral Oral Oral  SpO2: 95% 97% 94% 96%  Weight:      Height:        Intake/Output Summary (Last 24 hours) at 01/05/2019 1226 Last data filed at 01/05/2019 0600 Gross per 24 hour  Intake 743.1 ml  Output 1450 ml   Net -706.9 ml   Filed Weights   01/01/19 0818 01/04/19 0505  Weight: 86.2 kg 90.9 kg    Examination:  GENERAL: No acute distress.  Appears well.  HEENT: MMM.  Vision and hearing grossly intact.  NECK: Supple.  No apparent JVD.  RESP:  No IWOB. Good air movement bilaterally. CVS:  RRR. Heart sounds normal.  ABD/GI/GU: Bowel sounds present. Soft. Non tender.  MSK/EXT:  Moves extremities. No apparent deformity.  1+ edema bilaterally. SKIN: no apparent skin lesion or wound NEURO: Awake, alert but only oriented to self.  Cranial nerves grossly intact.  Moves all extremities. PSYCH: Calm. Normal affect.   Assessment & Plan: Acute metabolic encephalopathy/slurred speech: Unknown baseline mental status.  No focal neuro deficit.  CVA work-up including CT head, MRI brain, TTE and carotid Doppler without acute finding but but motion degraded MRA with intracranial atherosclerotic disease which could be severe.  Neurology recommended outpatient follow-up.  Not on sedating medications.  She also have Klebsiella UTI for which he is on Keflex.  She is awake and alert but only oriented to self.  Follows commands. -Treat treatable causes -Frequent reorientation and delirium precautions -Outpatient neurology follow-up  Fall at home Left-sided rib fractures-no flail chest -Conservative care per trauma surgery -PT/OT/IS and pain control.  Symptomatic anemia: FOBT negative. -Hgb 5.2 (admit)> 2 units> 8.8 (exaggerated)> 7.5>> 7.3 -EGD with severe ulcerative esophagitis, GE junction  stricture (biopsy negative for dysplasia) -Continue Protonix twice daily per GI-outpatient GI follow-up in 6 to 8 weeks -Eliquis resumed after discussion between GI and previous attending.   Klebsiella UTI:  -Continue Keflex  Uncontrolled DM-2 with hypo and hyperglycemia: A1c 5.2% though after transfusion. CBG (last 3)  Recent Labs    01/04/19 2127 01/05/19 0634 01/05/19 1201  GLUCAP 245* 208* 212*  -Increase  Lantus to 8 units -Continue SSI and CBG monitoring  New onset paroxysmal A. fib?: Now in NSR.  CHA2DS2-VASc score 5.  Started on anticoagulation but high risk for fall.  Echo with EF of 60 to 65%.  TSH normal. -Cardizem discontinued due to bradycardia -Started on Eliquis-doubt candidacy given recurrent fall.  Will recommend discontinuing at discharge  Hypothyroidism: TSH within normal -Continue home Synthroid  Essential hypertension: Normotensive -Continue current regimen  Abnormal lesion on left kidney: Seen on CAT scan -Outpatient follow-up  OSA on CPAP -Continue nightly CPAP  CKD-3: Stable  History of breast cancer in remission    Severe protein calorie malnutrition Nutrition Problem: Increased nutrient needs Etiology: acute illness  Signs/Symptoms: estimated needs  Interventions: Ensure Enlive (each supplement provides 350kcal and 20 grams of protein), MVI   DVT prophylaxis: On Eliquis for A. fib Code Status: Full code Family Communication: Patient and/or RN. Available if any question. Disposition Plan: Remains inpatient.  Patient continues to be encephalopathic Consultants: Neurology  Procedures:  None  Microbiology summarized: [id number] negative 10/22 and 10/27 Urine culture with Klebsiella pneumonia.   Sch Meds:  Scheduled Meds: . sodium chloride   Intravenous Once  . apixaban  5 mg Oral BID  . brimonidine  1 drop Both Eyes BID   And  . timolol  1 drop Both Eyes BID  . cephALEXin  500 mg Oral Q8H  . feeding supplement (GLUCERNA SHAKE)  237 mL Oral TID BM  . FLUoxetine  20 mg Oral Daily  . insulin aspart  0-5 Units Subcutaneous QHS  . insulin aspart  0-9 Units Subcutaneous TID WC  . insulin glargine  8 Units Subcutaneous QHS  . levothyroxine  50 mcg Oral Daily  . multivitamin with minerals  1 tablet Oral Daily  . pantoprazole  40 mg Oral BID AC  . polyethylene glycol  17 g Oral BID  . rosuvastatin  20 mg Oral Daily  . tamsulosin  0.4 mg Oral Daily    Continuous Infusions: . sodium chloride 50 mL/hr at 01/04/19 1707   PRN Meds:.acetaminophen **OR** acetaminophen, alum & mag hydroxide-simeth, ondansetron **OR** ondansetron (ZOFRAN) IV  Antimicrobials: Anti-infectives (From admission, onward)   Start     Dose/Rate Route Frequency Ordered Stop   01/04/19 1700  cephALEXin (KEFLEX) capsule 500 mg     500 mg Oral Every 8 hours 01/04/19 1041 01/09/19 1359   01/02/19 1800  cefTRIAXone (ROCEPHIN) 1 g in sodium chloride 0.9 % 100 mL IVPB  Status:  Discontinued    Note to Pharmacy: Give after urine culture has been collected   1 g 200 mL/hr over 30 Minutes Intravenous Every 24 hours 01/02/19 1549 01/04/19 1041       I have personally reviewed the following labs and images: CBC: Recent Labs  Lab 12/30/18 2246  01/01/19 0319 01/02/19 0350 01/02/19 1644 01/03/19 0523 01/03/19 1906 01/04/19 0330 01/05/19 0403  WBC 6.7   < > 7.7 9.2  --  8.1  --  9.4 6.9  NEUTROABS 5.4  --   --   --   --   --   --   --   --  HGB 5.2*   < > 8.1* 7.5* 7.5* 7.2* 7.9* 7.5* 7.3*  HCT 20.0*   < > 26.8* 25.4* 25.2* 24.7* 27.0* 26.0* 25.7*  MCV 62.9*   < > 68.9* 70.4*  --  71.4*  --  72.6* 74.5*  PLT 402*   < > 321 336  --  314  --  313 301   < > = values in this interval not displayed.   BMP &GFR Recent Labs  Lab 01/01/19 0319 01/02/19 0350 01/03/19 0523 01/04/19 0330 01/05/19 0403  NA 131* 133* 133* 128* 131*  K 4.2 3.9 4.1 4.4 4.9  CL 102 103 103 99 103  CO2 19* 23 21* 21* 21*  GLUCOSE 230* 95 129* 172* 240*  BUN 19 19 19 22  26*  CREATININE 1.32* 1.23* 1.44* 1.48* 1.58*  CALCIUM 7.8* 8.1* 8.4* 8.3* 8.1*  MG 1.8 1.9 1.9 1.8 1.7   Estimated Creatinine Clearance: 31.8 mL/min (A) (by C-G formula based on SCr of 1.58 mg/dL (H)). Liver & Pancreas: Recent Labs  Lab 01/01/19 0319 01/02/19 0350 01/03/19 0523 01/04/19 0330 01/05/19 0403  AST 21 19 19 15 28   ALT 16 16 17 16 23   ALKPHOS 63 57 53 58 62  BILITOT 1.2 0.8 0.5 0.7 0.2*  PROT  5.0* 4.9* 4.8* 4.9* 4.6*  ALBUMIN 2.3* 2.2* 2.1* 2.0* 1.9*   No results for input(s): LIPASE, AMYLASE in the last 168 hours. No results for input(s): AMMONIA in the last 168 hours. Diabetic: No results for input(s): HGBA1C in the last 72 hours. Recent Labs  Lab 01/04/19 1119 01/04/19 1619 01/04/19 2127 01/05/19 0634 01/05/19 1201  GLUCAP 179* 159* 245* 208* 212*   Cardiac Enzymes: No results for input(s): CKTOTAL, CKMB, CKMBINDEX, TROPONINI in the last 168 hours. No results for input(s): PROBNP in the last 8760 hours. Coagulation Profile: No results for input(s): INR, PROTIME in the last 168 hours. Thyroid Function Tests: No results for input(s): TSH, T4TOTAL, FREET4, T3FREE, THYROIDAB in the last 72 hours. Lipid Profile: No results for input(s): CHOL, HDL, LDLCALC, TRIG, CHOLHDL, LDLDIRECT in the last 72 hours. Anemia Panel: No results for input(s): VITAMINB12, FOLATE, FERRITIN, TIBC, IRON, RETICCTPCT in the last 72 hours. Urine analysis:    Component Value Date/Time   COLORURINE YELLOW 12/07/2017 0021   APPEARANCEUR CLEAR 12/07/2017 0021   LABSPEC 1.030 12/07/2017 0021   PHURINE 6.0 12/07/2017 0021   GLUCOSEU NEGATIVE 12/07/2017 0021   HGBUR MODERATE (A) 12/07/2017 0021   BILIRUBINUR NEGATIVE 12/07/2017 0021   KETONESUR NEGATIVE 12/07/2017 0021   PROTEINUR 30 (A) 12/07/2017 0021   UROBILINOGEN 0.2 07/05/2009 0331   NITRITE POSITIVE (A) 12/07/2017 0021   LEUKOCYTESUR SMALL (A) 12/07/2017 0021   Sepsis Labs: Invalid input(s): PROCALCITONIN, [place]  Microbiology: Recent Results (from the past 240 hour(s))  SARS CORONAVIRUS 2 (TAT 6-24 HRS) Nasopharyngeal Nasopharyngeal Swab     Status: None   Collection Time: 12/30/18 11:32 PM   Specimen: Nasopharyngeal Swab  Result Value Ref Range Status   SARS Coronavirus 2 NEGATIVE NEGATIVE Final    Comment: (NOTE) SARS-CoV-2 target nucleic acids are NOT DETECTED. The SARS-CoV-2 RNA is generally detectable in upper and  lower respiratory specimens during the acute phase of infection. Negative results do not preclude SARS-CoV-2 infection, do not rule out co-infections with other pathogens, and should not be used as the sole basis for treatment or other patient management decisions. Negative results must be combined with clinical observations, patient history, and epidemiological information. The expected result  is Negative. Fact Sheet for Patients: SugarRoll.be Fact Sheet for Healthcare Providers: https://www.woods-mathews.com/ This test is not yet approved or cleared by the [place] FDA and  has been authorized for detection and/or diagnosis of SARS-CoV-2 by FDA under an Emergency Use Authorization (EUA). This EUA will remain  in effect (meaning this test can be used) for the duration of the COVID-19 declaration under Section 56 4(b)(1) of the Act, 21 U.S.C. section 360bbb-3(b)(1), unless the authorization is terminated or revoked sooner. Performed at [facility], [place] [address] [place], [place] [address]   Culture, Urine     Status: Abnormal   Collection Time: 01/02/19  7:46 AM   Specimen: Urine, Random  Result Value Ref Range Status   Specimen Description URINE, RANDOM  Final   Special Requests   Final    NONE Performed at [facility], [place] [address], [place], [place] [address]    Culture 80,000 COLONIES/mL KLEBSIELLA PNEUMONIAE (A)  Final   Report Status 01/04/2019 FINAL  Final   Organism ID, Bacteria KLEBSIELLA PNEUMONIAE (A)  Final      Susceptibility   Klebsiella pneumoniae - MIC*    AMPICILLIN >=32 RESISTANT Resistant     CEFAZOLIN <=4 SENSITIVE Sensitive     CEFTRIAXONE <=1 SENSITIVE Sensitive     CIPROFLOXACIN <=0.25 SENSITIVE Sensitive     GENTAMICIN <=1 SENSITIVE Sensitive     IMIPENEM 2 SENSITIVE Sensitive     NITROFURANTOIN 32 SENSITIVE Sensitive     TRIMETH/SULFA <=20 SENSITIVE Sensitive     AMPICILLIN/SULBACTAM  <=2 SENSITIVE Sensitive     PIP/TAZO <=4 SENSITIVE Sensitive     Extended ESBL NEGATIVE Sensitive     * 80,000 COLONIES/mL KLEBSIELLA PNEUMONIAE  SARS CORONAVIRUS 2 (TAT 6-24 HRS) Nasopharyngeal Nasopharyngeal Swab     Status: None   Collection Time: 01/04/19  4:47 PM   Specimen: Nasopharyngeal Swab  Result Value Ref Range Status   SARS Coronavirus 2 NEGATIVE NEGATIVE Final    Comment: (NOTE) SARS-CoV-2 target nucleic acids are NOT DETECTED. The SARS-CoV-2 RNA is generally detectable in upper and lower respiratory specimens during the acute phase of infection. Negative results do not preclude SARS-CoV-2 infection, do not rule out co-infections with other pathogens, and should not be used as the sole basis for treatment or other patient management decisions. Negative results must be combined with clinical observations, patient history, and epidemiological information. The expected result is Negative. Fact Sheet for Patients: SugarRoll.be Fact Sheet for Healthcare Providers: https://www.woods-mathews.com/ This test is not yet approved or cleared by the [place] FDA and  has been authorized for detection and/or diagnosis of SARS-CoV-2 by FDA under an Emergency Use Authorization (EUA). This EUA will remain  in effect (meaning this test can be used) for the duration of the COVID-19 declaration under Section 56 4(b)(1) of the Act, 21 U.S.C. section 360bbb-3(b)(1), unless the authorization is terminated or revoked sooner. Performed at [facility], [place] [address], [place], [place] [address]     Radiology Studies: No results found.  40 minutes with more than 50% spent in reviewing records, counseling patient and coordinating care.   T. [place]  If 7PM-7AM, please contact night-coverage [email or web address] Password [facility] 01/05/2019, 12:26 PM

## 2019-01-05 NOTE — Progress Notes (Signed)
  Speech Language Pathology Treatment: Cognitive-Linquistic  Patient Details Name: Diana [name] MRN: [id number] DOB: Dec 01, 1933 Today's Date: 01/05/2019 Time: [id number] SLP Time Calculation (min) (ACUTE ONLY): 16 min  Assessment / Plan / Recommendation Clinical Impression  Pt was encountered awake/alert and she was agreeable to ST treatment session.  Treatment focused on dysarthria and cognitive deficits.  Pt was observed to have improved speech intelligibility in comparison to initial evaluation and she verbalized improvement.  SLP educated regarding speech intelligibility strategies (slow, loud, over-articulate, pause) and she verbalized understanding via teach back.  Pt utilized the "loud" and "over-articulate" strategies in a structured, conversational speech task, which improved her speech intelligibility to >95% given minimal cues.  In addition, pt completed basic verbal problem solving questions with 90% accuracy independently, improving to 100% given minimal verbal cues.  She recalled numeric sequences (up to 6) targeting sustained attention with overall 4/5 (80%) accuracy independently, improving to 100% accuracy given a repetition.  Recommend home health ST and assistance with IADLs (medications, finances, etc.) at time of discharge.  SLP will continue to f/u per POC.    HPI HPI: 85 y.o. female admitted on 12/30/18 for fall with symptomatic anemia s/p 2 units PRBCs, hypoglycemia, A-fib, and rib fractures L anterior 6-9.  Pt with other significant PMH of DM2, renal insufficiency, obesity, HTN, breast CA, anemia (due to poor B12 absorption), back surgery, bowel resection, bil mastectomy.        SLP Plan  Continue with current plan of care       Recommendations                   Oral Care Recommendations: Oral care BID Follow up Recommendations: Home health SLP SLP Visit Diagnosis: Dysarthria and anarthria (R47.1);Cognitive communication deficit (R41.841) Plan: Continue  with current plan of care       Bretta Bang, M.S., [place] Office: [phone]                [place] 01/05/2019, 8:57 AM

## 2019-01-06 ENCOUNTER — Encounter (HOSPITAL_COMMUNITY): Payer: Self-pay | Admitting: General Practice

## 2019-01-06 DIAGNOSIS — E119 Type 2 diabetes mellitus without complications: Secondary | ICD-10-CM | POA: Diagnosis not present

## 2019-01-06 DIAGNOSIS — D649 Anemia, unspecified: Secondary | ICD-10-CM | POA: Diagnosis not present

## 2019-01-06 DIAGNOSIS — S2249XA Multiple fractures of ribs, unspecified side, initial encounter for closed fracture: Secondary | ICD-10-CM | POA: Diagnosis not present

## 2019-01-06 DIAGNOSIS — G4733 Obstructive sleep apnea (adult) (pediatric): Secondary | ICD-10-CM | POA: Diagnosis not present

## 2019-01-06 LAB — CBC
HCT: 25.5 % — ABNORMAL LOW (ref 36.0–46.0)
Hemoglobin: 7.3 g/dL — ABNORMAL LOW (ref 12.0–15.0)
MCH: 21.4 pg — ABNORMAL LOW (ref 26.0–34.0)
MCHC: 28.6 g/dL — ABNORMAL LOW (ref 30.0–36.0)
MCV: 74.8 fL — ABNORMAL LOW (ref 80.0–100.0)
Platelets: 317 10*3/uL (ref 150–400)
RBC: 3.41 MIL/uL — ABNORMAL LOW (ref 3.87–5.11)
RDW: 30.4 % — ABNORMAL HIGH (ref 11.5–15.5)
WBC: 9.5 10*3/uL (ref 4.0–10.5)
nRBC: 0 % (ref 0.0–0.2)

## 2019-01-06 LAB — BASIC METABOLIC PANEL
Anion gap: 7 (ref 5–15)
BUN: 23 mg/dL (ref 8–23)
CO2: 22 mmol/L (ref 22–32)
Calcium: 8.1 mg/dL — ABNORMAL LOW (ref 8.9–10.3)
Chloride: 104 mmol/L (ref 98–111)
Creatinine, Ser: 1.25 mg/dL — ABNORMAL HIGH (ref 0.44–1.00)
GFR calc Af Amer: 46 mL/min — ABNORMAL LOW (ref >60)
GFR calc non Af Amer: 40 mL/min — ABNORMAL LOW (ref >60)
Glucose, Bld: 187 mg/dL — ABNORMAL HIGH (ref 70–99)
Potassium: 4.8 mmol/L (ref 3.5–5.1)
Sodium: 133 mmol/L — ABNORMAL LOW (ref 135–145)

## 2019-01-06 LAB — PHOSPHORUS: Phosphorus: 3.8 mg/dL (ref 2.5–4.6)

## 2019-01-06 LAB — GLUCOSE, CAPILLARY
Glucose-Capillary: 181 mg/dL — ABNORMAL HIGH (ref 70–99)
Glucose-Capillary: 237 mg/dL — ABNORMAL HIGH (ref 70–99)
Glucose-Capillary: 200 mg/dL — ABNORMAL HIGH (ref 70–99)
Glucose-Capillary: 249 mg/dL — ABNORMAL HIGH (ref 70–99)

## 2019-01-06 LAB — MAGNESIUM: Magnesium: 1.7 mg/dL (ref 1.7–2.4)

## 2019-01-06 MED ORDER — SENNA 8.6 MG PO TABS
1.0000 | ORAL_TABLET | Freq: Every day | ORAL | Status: DC
  Administered 2019-01-06 – 2019-01-07 (×2): 8.6 mg via ORAL
  Filled 2019-01-06 (×2): qty 1

## 2019-01-06 MED ORDER — CEPHALEXIN 500 MG PO CAPS
500.0000 mg | ORAL_CAPSULE | Freq: Two times a day (BID) | ORAL | Status: DC
  Administered 2019-01-06 – 2019-01-07 (×2): 500 mg via ORAL
  Filled 2019-01-06 (×2): qty 1

## 2019-01-06 MED ORDER — CHLORHEXIDINE GLUCONATE CLOTH 2 % EX PADS
6.0000 | MEDICATED_PAD | Freq: Every day | CUTANEOUS | Status: DC
  Administered 2019-01-06: 6 via TOPICAL

## 2019-01-06 MED ORDER — FERROUS SULFATE 325 (65 FE) MG PO TABS
325.0000 mg | ORAL_TABLET | Freq: Two times a day (BID) | ORAL | Status: DC
  Administered 2019-01-06 – 2019-01-07 (×4): 325 mg via ORAL
  Filled 2019-01-06 (×4): qty 1

## 2019-01-06 NOTE — Care Management Important Message (Signed)
Important Message  Patient Details  Name: Diana [name] MRN: [id number] Date of Birth: 05-30-1933   Medicare Important Message Given:  Yes     Shelda Altes 01/06/2019, 9:01 AM

## 2019-01-06 NOTE — Progress Notes (Signed)
PROGRESS NOTE  Diana [name] [id number] DOB: 1933/11/09   PCP: [name], PA-C  Patient is from: Home  DOA: 12/30/2018 LOS: 5  Brief Narrative / Interim history: 85 year old female with history of DM-2, HTN, hypothyroidism, breast cancer in remission, OSA on CPAP and anemia presenting with generalized weakness and fall at home.  Mildly confused and hypoglycemic to 44 when EMS arrived.  In ED, Hgb 5.  2.  COVID-19 negative.  FOBT negative.  EKG revealed rate controlled A. fib.  CT head/cervical spine/chest/abdomen/pelvis revealed left-sided rib fractures with contusion of left chest wall but no other acute finding.  Trauma consulted.  She was admitted for recurrent falls, hypoglycemia, and generalized weakness.  She was found to have significant anemia and was transfused.  She had severe ulcerative esophagitis on endoscopy and is now on a PPI BID. She was noted to have slurred speech on hospital day 1 and worked up for stroke which was negative.  She was found to have a UTI and is being treated with keflex.  She was also noted to have new onset a fib and is now on anticoagulation.  Discharge currently pending SNF placement.  Subjective: No major events overnight of this morning.  No complaint this morning.  Denies chest pain, dyspnea, nausea, vomiting or abdominal pain.  Somewhat sleepy but rises to voice.  She is oriented x4.  Objective: Vitals:   01/06/19 0400 01/06/19 0500 01/06/19 0501 01/06/19 0800  BP: 131/68 140/74 140/74 (!) 152/64  Pulse: 71 62 66   Resp: (!) 21 (!) 21 19   Temp:   98.1 F (36.7 C) 98.1 F (36.7 C)  TempSrc:   Oral Oral  SpO2: 97% 98% 97%   Weight:      Height:        Intake/Output Summary (Last 24 hours) at 01/06/2019 1331 Last data filed at 01/06/2019 0543 Gross per 24 hour  Intake 549.35 ml  Output 2325 ml  Net -1775.65 ml   Filed Weights   01/01/19 0818 01/04/19 0505  Weight: 86.2 kg 90.9 kg    Examination:  GENERAL:  No acute distress.  Appears well.  HEENT: MMM.  Vision and hearing grossly intact.  NECK: Supple.  No apparent JVD.  RESP:  No IWOB. Good air movement bilaterally. CVS:  RRR. Heart sounds normal.  ABD/GI/GU: Bowel sounds present. Soft. Non tender.  MSK/EXT:  Moves extremities. No apparent deformity.  1+ edema bilaterally. SKIN: no apparent skin lesion or wound NEURO: Awake, alert but only oriented to self.  Cranial nerves grossly intact.  Moves all extremities. PSYCH: Calm. Normal affect.   Assessment & Plan: Acute metabolic encephalopathy/slurred speech: Resolved.  She is oriented x4 this morning.  No focal neuro deficit.  CVA work-up including CT head, MRI brain, TTE and carotid Doppler without acute finding but but motion degraded MRA with intracranial atherosclerotic disease which could be severe.  Neurology recommended outpatient follow-up.  Not on sedating medications.  She also have Klebsiella UTI for which he is on Keflex. -Treat treatable causes -Frequent reorientation and delirium precautions -Outpatient neurology follow-up  Fall at home Left-sided rib fractures-no flail chest -Conservative care per trauma surgery -PT/OT/IS and pain control.  Symptomatic anemia: FOBT negative. -Hgb 5.2 (admit)> 2 units> 8.8 (exaggerated)> 7.5>> 7.3 -EGD with severe ulcerative esophagitis, GE junction stricture (biopsy negative for dysplasia) -Continue Protonix twice daily per GI-outpatient GI follow-up in 6 to 8 weeks -Eliquis resumed after discussion between GI and previous attending.  Klebsiella UTI:  -  Ceftriaxone 10/25-10/27.  Keflex 10/27>>  Uncontrolled DM-2 with hypo and hyperglycemia: A1c 5.2% though after transfusion. CBG (last 3)  Recent Labs    01/05/19 1624 01/05/19 2134 01/06/19 0559  GLUCAP 173* 220* 181*  -Increase Lantus to 10 units -Continue SSI and CBG monitoring  New onset paroxysmal A. fib?: Now in NSR.  CHA2DS2-VASc score 5.  Started on anticoagulation but high  risk for fall.  Echo with EF of 60 to 65%.  TSH normal. -Cardizem discontinued due to bradycardia -Started on Eliquis-doubt candidacy given recurrent fall.  Will recommend discontinuing at discharge  Hypothyroidism: TSH within normal -Continue home Synthroid  Essential hypertension: Normotensive -Continue current regimen  Abnormal lesion on left kidney: Seen on CAT scan -Outpatient follow-up  OSA on CPAP -Continue nightly CPAP  CKD-3: Stable  History of breast cancer in remission    Severe protein calorie malnutrition Nutrition Problem: Increased nutrient needs Etiology: acute illness  Signs/Symptoms: estimated needs  Interventions: Ensure Enlive (each supplement provides 350kcal and 20 grams of protein), MVI   DVT prophylaxis: On Eliquis for A. fib Code Status: Full code Family Communication: Patient and/or RN. Available if any question. Disposition Plan: Remains inpatient.  Patient continues to be encephalopathic Consultants: Neurology, trauma surgery  Procedures:  None  Microbiology summarized: [id number] negative 10/22 and 10/27 Urine culture with Klebsiella pneumonia.   Sch Meds:  Scheduled Meds:  sodium chloride   Intravenous Once   apixaban  2.5 mg Oral BID   brimonidine  1 drop Both Eyes BID   And   timolol  1 drop Both Eyes BID   cephALEXin  500 mg Oral Q12H   Chlorhexidine Gluconate Cloth  6 each Topical Daily   feeding supplement (GLUCERNA SHAKE)  237 mL Oral TID BM   ferrous sulfate  325 mg Oral BID WC   FLUoxetine  20 mg Oral Daily   insulin aspart  0-5 Units Subcutaneous QHS   insulin aspart  0-9 Units Subcutaneous TID WC   insulin glargine  8 Units Subcutaneous QHS   levothyroxine  50 mcg Oral Daily   multivitamin with minerals  1 tablet Oral Daily   pantoprazole  40 mg Oral BID AC   polyethylene glycol  17 g Oral BID   rosuvastatin  20 mg Oral Daily   senna  1 tablet Oral Daily   tamsulosin  0.4 mg Oral Daily    Continuous Infusions:  PRN Meds:.acetaminophen **OR** acetaminophen, alum & mag hydroxide-simeth, ondansetron **OR** ondansetron (ZOFRAN) IV  Antimicrobials: Anti-infectives (From admission, onward)   Start     Dose/Rate Route Frequency Ordered Stop   01/06/19 2200  cephALEXin (KEFLEX) capsule 500 mg     500 mg Oral Every 12 hours 01/06/19 1045 12/09/19 1359   01/04/19 1700  cephALEXin (KEFLEX) capsule 500 mg  Status:  Discontinued     500 mg Oral Every 8 hours 01/04/19 1041 01/06/19 1045   01/02/19 1800  cefTRIAXone (ROCEPHIN) 1 g in sodium chloride 0.9 % 100 mL IVPB  Status:  Discontinued    Note to Pharmacy: Give after urine culture has been collected   1 g 200 mL/hr over 30 Minutes Intravenous Every 24 hours 01/02/19 1549 01/04/19 1041       I have personally reviewed the following labs and images: CBC: Recent Labs  Lab 12/30/18 2246  01/02/19 0350  01/03/19 0523 01/03/19 1906 01/04/19 0330 01/05/19 0403 01/06/19 0329  WBC 6.7   < > 9.2  --  8.1  --  9.4 6.9 9.5  NEUTROABS 5.4  --   --   --   --   --   --   --   --   HGB 5.2*   < > 7.5*   < > 7.2* 7.9* 7.5* 7.3* 7.3*  HCT 20.0*   < > 25.4*   < > 24.7* 27.0* 26.0* 25.7* 25.5*  MCV 62.9*   < > 70.4*  --  71.4*  --  72.6* 74.5* 74.8*  PLT 402*   < > 336  --  314  --  313 301 317   < > = values in this interval not displayed.   BMP &GFR Recent Labs  Lab 01/02/19 0350 01/03/19 0523 01/04/19 0330 01/05/19 0403 01/06/19 0329  NA 133* 133* 128* 131* 133*  K 3.9 4.1 4.4 4.9 4.8  CL 103 103 99 103 104  CO2 23 21* 21* 21* 22  GLUCOSE 95 129* 172* 240* 187*  BUN 19 19 22  26* 23  CREATININE 1.23* 1.44* 1.48* 1.58* 1.25*  CALCIUM 8.1* 8.4* 8.3* 8.1* 8.1*  MG 1.9 1.9 1.8 1.7 1.7  PHOS  --   --   --   --  3.8   Estimated Creatinine Clearance: 40.2 mL/min (A) (by C-G formula based on SCr of 1.25 mg/dL (H)). Liver & Pancreas: Recent Labs  Lab 01/01/19 0319 01/02/19 0350 01/03/19 0523 01/04/19 0330 01/05/19 0403   AST 21 19 19 15 28   ALT 16 16 17 16 23   ALKPHOS 63 57 53 58 62  BILITOT 1.2 0.8 0.5 0.7 0.2*  PROT 5.0* 4.9* 4.8* 4.9* 4.6*  ALBUMIN 2.3* 2.2* 2.1* 2.0* 1.9*   No results for input(s): LIPASE, AMYLASE in the last 168 hours. No results for input(s): AMMONIA in the last 168 hours. Diabetic: No results for input(s): HGBA1C in the last 72 hours. Recent Labs  Lab 01/05/19 0634 01/05/19 1201 01/05/19 1624 01/05/19 2134 01/06/19 0559  GLUCAP 208* 212* 173* 220* 181*   Cardiac Enzymes: No results for input(s): CKTOTAL, CKMB, CKMBINDEX, TROPONINI in the last 168 hours. No results for input(s): PROBNP in the last 8760 hours. Coagulation Profile: No results for input(s): INR, PROTIME in the last 168 hours. Thyroid Function Tests: No results for input(s): TSH, T4TOTAL, FREET4, T3FREE, THYROIDAB in the last 72 hours. Lipid Profile: No results for input(s): CHOL, HDL, LDLCALC, TRIG, CHOLHDL, LDLDIRECT in the last 72 hours. Anemia Panel: No results for input(s): VITAMINB12, FOLATE, FERRITIN, TIBC, IRON, RETICCTPCT in the last 72 hours. Urine analysis:    Component Value Date/Time   COLORURINE YELLOW 12/07/2017 0021   APPEARANCEUR CLEAR 12/07/2017 0021   LABSPEC 1.030 12/07/2017 0021   PHURINE 6.0 12/07/2017 0021   GLUCOSEU NEGATIVE 12/07/2017 0021   HGBUR MODERATE (A) 12/07/2017 0021   BILIRUBINUR NEGATIVE 12/07/2017 0021   KETONESUR NEGATIVE 12/07/2017 0021   PROTEINUR 30 (A) 12/07/2017 0021   UROBILINOGEN 0.2 07/05/2009 0331   NITRITE POSITIVE (A) 12/07/2017 0021   LEUKOCYTESUR SMALL (A) 12/07/2017 0021   Sepsis Labs: Invalid input(s): PROCALCITONIN, [place]  Microbiology: Recent Results (from the past 240 hour(s))  SARS CORONAVIRUS 2 (TAT 6-24 HRS) Nasopharyngeal Nasopharyngeal Swab     Status: None   Collection Time: 12/30/18 11:32 PM   Specimen: Nasopharyngeal Swab  Result Value Ref Range Status   SARS Coronavirus 2 NEGATIVE NEGATIVE Final    Comment:  (NOTE) SARS-CoV-2 target nucleic acids are NOT DETECTED. The SARS-CoV-2 RNA is generally detectable in upper and lower respiratory specimens during  the acute phase of infection. Negative results do not preclude SARS-CoV-2 infection, do not rule out co-infections with other pathogens, and should not be used as the sole basis for treatment or other patient management decisions. Negative results must be combined with clinical observations, patient history, and epidemiological information. The expected result is Negative. Fact Sheet for Patients: SugarRoll.be Fact Sheet for Healthcare Providers: https://www.woods-mathews.com/ This test is not yet approved or cleared by the [place] FDA and  has been authorized for detection and/or diagnosis of SARS-CoV-2 by FDA under an Emergency Use Authorization (EUA). This EUA will remain  in effect (meaning this test can be used) for the duration of the COVID-19 declaration under Section 56 4(b)(1) of the Act, 21 U.S.C. section 360bbb-3(b)(1), unless the authorization is terminated or revoked sooner. Performed at [facility], [place] [address] [place], [place] [address]   Culture, Urine     Status: Abnormal   Collection Time: 01/02/19  7:46 AM   Specimen: Urine, Random  Result Value Ref Range Status   Specimen Description URINE, RANDOM  Final   Special Requests   Final    NONE Performed at [facility], [place] [address], [place], [place] [address]    Culture 80,000 COLONIES/mL KLEBSIELLA PNEUMONIAE (A)  Final   Report Status 01/04/2019 FINAL  Final   Organism ID, Bacteria KLEBSIELLA PNEUMONIAE (A)  Final      Susceptibility   Klebsiella pneumoniae - MIC*    AMPICILLIN >=32 RESISTANT Resistant     CEFAZOLIN <=4 SENSITIVE Sensitive     CEFTRIAXONE <=1 SENSITIVE Sensitive     CIPROFLOXACIN <=0.25 SENSITIVE Sensitive     GENTAMICIN <=1 SENSITIVE Sensitive     IMIPENEM 2 SENSITIVE Sensitive      NITROFURANTOIN 32 SENSITIVE Sensitive     TRIMETH/SULFA <=20 SENSITIVE Sensitive     AMPICILLIN/SULBACTAM <=2 SENSITIVE Sensitive     PIP/TAZO <=4 SENSITIVE Sensitive     Extended ESBL NEGATIVE Sensitive     * 80,000 COLONIES/mL KLEBSIELLA PNEUMONIAE  SARS CORONAVIRUS 2 (TAT 6-24 HRS) Nasopharyngeal Nasopharyngeal Swab     Status: None   Collection Time: 01/04/19  4:47 PM   Specimen: Nasopharyngeal Swab  Result Value Ref Range Status   SARS Coronavirus 2 NEGATIVE NEGATIVE Final    Comment: (NOTE) SARS-CoV-2 target nucleic acids are NOT DETECTED. The SARS-CoV-2 RNA is generally detectable in upper and lower respiratory specimens during the acute phase of infection. Negative results do not preclude SARS-CoV-2 infection, do not rule out co-infections with other pathogens, and should not be used as the sole basis for treatment or other patient management decisions. Negative results must be combined with clinical observations, patient history, and epidemiological information. The expected result is Negative. Fact Sheet for Patients: SugarRoll.be Fact Sheet for Healthcare Providers: https://www.woods-mathews.com/ This test is not yet approved or cleared by the [place] FDA and  has been authorized for detection and/or diagnosis of SARS-CoV-2 by FDA under an Emergency Use Authorization (EUA). This EUA will remain  in effect (meaning this test can be used) for the duration of the COVID-19 declaration under Section 56 4(b)(1) of the Act, 21 U.S.C. section 360bbb-3(b)(1), unless the authorization is terminated or revoked sooner. Performed at [facility], [place] [address] [place], [place] [address]     Radiology Studies: No results found.   Cayman Brogden T. [place]  If 7PM-7AM, please contact night-coverage [email or web address] Password [email or web address] 01/06/2019, 1:31 PM

## 2019-01-06 NOTE — Progress Notes (Signed)
Physical Therapy Treatment Patient Details Name: Diana [name] MRN: [id number] DOB: [birth year] Today's Date: 01/06/2019    History of Present Illness Pt is a 85 y.o. female admitted 12/30/18 for fall with symptomatic anemia s/p 2 units PRBCs, hypoglycemia, A-fib, and rib fractures L anterior 6-9. Other PMH inludes DM2, renal insufficiency, obesity, HTN, breast CA, anemia (due to poor B12 absorption), back surgery, bowel resection, bil mastectomy.   PT Comments    Pt progressing with mobility. Requiring minA for limited mobility, assist for ADLs; pt limited by generalized weakness, decreased activity tolerance and pain. Continue to recommend SNF-level therapies to maximize functional mobility and independence prior to return home; pt agrees she is not safe to return home in current status.   Follow Up Recommendations  SNF;Supervision for mobility/OOB     Equipment Recommendations  None recommended by PT    Recommendations for Other Services       Precautions / Restrictions Precautions Precautions: Fall Precaution Comments: 2 falls in the past week trying to get to her WC Restrictions Weight Bearing Restrictions: No    Mobility  Bed Mobility Overal bed mobility: Needs Assistance Bed Mobility: Supine to Sit     Supine to sit: Supervision;HOB elevated     General bed mobility comments: Significant increased time and effort, limited by pain, requiring frequent cues to stay on task  Transfers Overall transfer level: Needs assistance Equipment used: Rolling walker (2 wheeled) Transfers: Sit to/from Stand Sit to Stand: Min assist         General transfer comment: Performed multiple sit<>stand from bed and recliner to RW, requiring minA for trunk elevation and steadying assist; heavy reliance on BUE support to push into standing  Ambulation/Gait Ambulation/Gait assistance: Min assist Gait Distance (Feet): 4 Feet Assistive device: Rolling walker (2 wheeled) Gait  Pattern/deviations: Step-to pattern;Trunk flexed;Leaning posteriorly Gait velocity: Decreased   General Gait Details: Short, shuffling steps from bed to recliner with RW and minA for balance; pt easily fatigued requiring seated rest break before standing again for pericare for bowel incontinence   Stairs             Wheelchair Mobility    Modified Rankin (Stroke Patients Only)       Balance Overall balance assessment: Needs assistance Sitting-balance support: Bilateral upper extremity supported;Feet supported Sitting balance-Leahy Scale: Fair       Standing balance-Leahy Scale: Poor Standing balance comment: Reliant on BUE support requiring assist to perform posterior pericare while standing                            Cognition Arousal/Alertness: Awake/alert Behavior During Therapy: WFL for tasks assessed/performed Overall Cognitive Status: Impaired/Different from baseline Area of Impairment: Attention;Safety/judgement;Problem solving;Awareness                   Current Attention Level: Sustained;Selective     Safety/Judgement: Decreased awareness of deficits Awareness: Emergent Problem Solving: Slow processing;Requires verbal cues        Exercises      General Comments General comments (skin integrity, edema, etc.): HR 70-90s with mobility      Pertinent Vitals/Pain Pain Assessment: Faces Faces Pain Scale: Hurts even more Pain Location: "Feels like my rectum is splitting open" Pain Descriptors / Indicators: Grimacing;Guarding;Moaning Pain Intervention(s): Monitored during session    Home Living Family/patient expects to be discharged to:: Unsure Living Arrangements: Alone  Prior Function            PT Goals (current goals can now be found in the care plan section) Progress towards PT goals: Progressing toward goals    Frequency    Min 2X/week      PT Plan Frequency needs to be updated     Co-evaluation              AM-PAC PT "6 Clicks" Mobility   Outcome Measure  Help needed turning from your back to your side while in a flat bed without using bedrails?: A Little   Help needed moving to and from a bed to a chair (including a wheelchair)?: A Little Help needed standing up from a chair using your arms (e.g., wheelchair or bedside chair)?: A Little Help needed to walk in hospital room?: A Little Help needed climbing 3-5 steps with a railing? : A Lot 6 Click Score: 14    End of Session   Activity Tolerance: Patient tolerated treatment well;Patient limited by fatigue Patient left: in chair;with call bell/phone within reach;with chair alarm set Nurse Communication: Mobility status PT Visit Diagnosis: Muscle weakness (generalized) (M62.81);Repeated falls (R29.6)     Time: [id number] PT Time Calculation (min) (ACUTE ONLY): 26 min  Charges:  $Therapeutic Activity: 23-37 mins                    Mabeline Caras, PT, DPT Acute Rehabilitation Services  Pager [phone] Office [place] 01/06/2019, 1:23 PM

## 2019-01-06 NOTE — Progress Notes (Addendum)
Nutrition Follow up  DOCUMENTATION CODES:   Not applicable  INTERVENTION:    Continue Glucerna Shake po TID, each supplement provides 220 kcal and 10 grams of protein  Continue MVI with minerals daily  NUTRITION DIAGNOSIS:   Increased nutrient needs related to acute illness as evidenced by estimated needs.  Ongoing   GOAL:   Patient will meet greater than or equal to 90% of their needs   Progressing   MONITOR:   PO intake, Labs, I & O's, Supplement acceptance, Weight trends  REASON FOR ASSESSMENT:   Consult Assessment of nutrition requirement/status  ASSESSMENT:  RD working remotely.  85 year old female with medical history significant of T2DM, HTN, OSA on CPAP, hypothyroidism, hx of breast cancer, who presented via EMS s/p fall with complaints of weakness. Patient reports recent history of falls and having epigastric discomfort with poor appetite over the past few weeks.   10/24- EGD showed severe ulcerative esophagitis, diet advanced to carb mod  Pt only oriented to self. Unable to obtain information. Meal completions charted as 25-75% for her last 4 meals. Glucerna given this am. Drinking mostly once daily per flowsheet. Will continue with current interventions.   Admission weight: 86.2 kg  Current weight: 90.9 kg    I/O: -4,123 ml since admit UOP: 2,475 ml x 24 hrs    Medications: ferrous sulfate, SS novolog, lantus, MVI with minerals, miralax Labs: Na 133 (L) CBG 173-245  Diet Order:   Diet Order            Diet Carb Modified Fluid consistency: Thin; Room service appropriate? No  Diet effective now              EDUCATION NEEDS:   No education needs have been identified at this time  Skin:  Skin Assessment: Skin Integrity Issues: Skin Integrity Issues:: Other (Comment) Other: MASD- perineum  Last BM:  10/28  Height:   Ht Readings from Last 1 Encounters:  01/01/19 5\' 8"  (1.727 m)    Weight:   Wt Readings from Last 1 Encounters:   01/04/19 90.9 kg    Ideal Body Weight:  63.6 kg  BMI:  Body mass index is 30.47 kg/m.  Estimated Nutritional Needs:   Kcal:  1650-1800  Protein:  83-90  Fluid:  > 1.5 L/day  Mariana Single RD, LDN Clinical Nutrition Pager # [phone]

## 2019-01-06 NOTE — Progress Notes (Signed)
No IV infusions or meds at this time. Notified nurse for vein preservation we will hold PIV restart. Instructed to notify [facility] with further needs. VU. Fran Lowes, RN [facility]

## 2019-01-06 NOTE — TOC Progression Note (Signed)
Transition of Care [facility]) - Progression Note    Patient Details  Name: AVIONA CLUTE MRN: [id number] Date of Birth: Sep 27, 1933  Transition of Care [facility]) CM/SW [place], [place] Phone Number: 01/06/2019, 12:24 PM  Clinical Narrative:     CSW called [place] remains pending.  Thurmond Butts, MSW, [facility] Clinical Social Worker [phone]     Expected Discharge Plan: Skilled Nursing Facility Barriers to Discharge: SNF Pending bed offer, Insurance Authorization, Continued Medical Work up  Expected Discharge Plan and Services Expected Discharge Plan: [place] In-house Referral: Clinical Social Work     Living arrangements for the past 2 months: Single Family Home                                       Social Determinants of Health (SDOH) Interventions    Readmission Risk Interventions No flowsheet data found.

## 2019-01-07 DIAGNOSIS — E119 Type 2 diabetes mellitus without complications: Secondary | ICD-10-CM | POA: Diagnosis not present

## 2019-01-07 DIAGNOSIS — K449 Diaphragmatic hernia without obstruction or gangrene: Secondary | ICD-10-CM

## 2019-01-07 DIAGNOSIS — R531 Weakness: Secondary | ICD-10-CM

## 2019-01-07 DIAGNOSIS — D649 Anemia, unspecified: Secondary | ICD-10-CM | POA: Diagnosis not present

## 2019-01-07 DIAGNOSIS — D62 Acute posthemorrhagic anemia: Secondary | ICD-10-CM

## 2019-01-07 DIAGNOSIS — E162 Hypoglycemia, unspecified: Secondary | ICD-10-CM | POA: Diagnosis not present

## 2019-01-07 LAB — GLUCOSE, CAPILLARY
Glucose-Capillary: 192 mg/dL — ABNORMAL HIGH (ref 70–99)
Glucose-Capillary: 231 mg/dL — ABNORMAL HIGH (ref 70–99)
Glucose-Capillary: 199 mg/dL — ABNORMAL HIGH (ref 70–99)

## 2019-01-07 LAB — MAGNESIUM: Magnesium: 1.7 mg/dL (ref 1.7–2.4)

## 2019-01-07 MED ORDER — PANTOPRAZOLE SODIUM 40 MG PO TBEC: 40.0000 mg | DELAYED_RELEASE_TABLET | Freq: Two times a day (BID) | ORAL | 0 refills | Status: DC

## 2019-01-07 MED ORDER — FERROUS SULFATE 325 (65 FE) MG PO TABS: 325.0000 mg | ORAL_TABLET | Freq: Two times a day (BID) | ORAL | 3 refills | Status: DC

## 2019-01-07 MED ORDER — SENNA 8.6 MG PO TABS: 1.0000 | ORAL_TABLET | Freq: Every day | ORAL | 0 refills | Status: DC | PRN

## 2019-01-07 MED ORDER — ENSURE ENLIVE PO LIQD: 237.0000 mL | Freq: Two times a day (BID) | ORAL | 12 refills | Status: DC

## 2019-01-07 MED ORDER — CEPHALEXIN 500 MG PO CAPS: 500.0000 mg | ORAL_CAPSULE | Freq: Two times a day (BID) | ORAL | 0 refills | Status: DC

## 2019-01-07 MED ORDER — POLYETHYLENE GLYCOL 3350 17 G PO PACK: 17.0000 g | PACK | Freq: Two times a day (BID) | ORAL | 0 refills | Status: DC | PRN

## 2019-01-07 MED ORDER — APIXABAN 2.5 MG PO TABS: 2.5000 mg | ORAL_TABLET | Freq: Two times a day (BID) | ORAL | Status: DC

## 2019-01-07 MED ORDER — LOSARTAN POTASSIUM 25 MG PO TABS: 25.0000 mg | ORAL_TABLET | Freq: Every day | ORAL | 0 refills | Status: DC

## 2019-01-07 MED ORDER — ACETAMINOPHEN 500 MG PO TABS: 1000.0000 mg | ORAL_TABLET | Freq: Three times a day (TID) | ORAL | 2 refills | Status: AC | PRN

## 2019-01-07 MED ORDER — INSULIN ASPART 100 UNIT/ML ~~LOC~~ SOLN: 3.0000 [IU] | Freq: Three times a day (TID) | SUBCUTANEOUS | Status: DC

## 2019-01-07 MED ORDER — VERAPAMIL HCL ER 120 MG PO TBCR: 240.0000 mg | EXTENDED_RELEASE_TABLET | Freq: Every day | ORAL | 1 refills | Status: DC

## 2019-01-07 NOTE — Progress Notes (Signed)
Pt unable to void after multiple attempts on BSC. Bladder scan shows >200. MD order to replace foley and d/c to SNF. SW updated on plan of care.  Clyde Canterbury, RN

## 2019-01-07 NOTE — Discharge Summary (Addendum)
Physician Discharge Summary  Diana [name] [id number] DOB: 05-04-1933 DOA: 12/30/2018  PCP: [name], PA-C  Admit date: 12/30/2018 Discharge date: 01/07/2019  Admitted From: Home Disposition: SNF  Recommendations for Outpatient Follow-up:  1. Follow up with PCP and cardiology in 1-2 weeks 2. Follow-up with neurology in 3 to 4 weeks. 3. Follow-up with gastroenterology in 6 to 8 weeks. 4. Please obtain CBC/BMP/Mag at follow up 5. Please follow up on the following pending results: None  Home Health: Not applicable Equipment/Devices: Not applicable  Discharge Condition: Stable CODE STATUS: Full code  Follow-up Information    Ledora Bottcher, PA Follow up.   Specialties: Physician Assistant, Cardiology, Radiology Why: Cardiology hospital follow up on 01/28/2019 at 11:00.  Please arrive at 10:45 for check in.  Contact information: [address] [place] [address] [phone]        [name], [place]. Schedule an appointment as soon as possible for a visit in 1 week(s).   Specialty: Physician Assistant Contact information: [address] [place] [address] [phone]        [facility] Follow up in 4 day(s).   Specialty: Neurology Contact information: [address] [place] [phone]       [facility]. Schedule an appointment as soon as possible for a visit in 6 week(s).   Specialty: Gastroenterology Contact information: [place] [phone]          Hospital Course: 85 year old female with history of DM-2, HTN, hypothyroidism, breast cancer in remission, OSA on CPAP and anemia presenting with generalized weakness and fall at home.  Mildly confused and hypoglycemic to 44 when EMS arrived.  In ED, Hgb 5.  2.  COVID-19 negative.  FOBT negative.  EKG revealed rate controlled A.  fib.  CT head/cervical spine/chest/abdomen/pelvis revealed left-sided rib fractures with contusion of left chest wall but no other acute finding. Trauma consulted.  She was admitted for recurrent falls, hypoglycemia, and generalized weakness. She was found to have significant anemia and was transfused. She had severe ulcerative esophagitis on endoscopy and is now on a PPI BID. She was noted to have slurred speech on hospital day 1 and worked up for stroke where negative except for motion degraded MRA head concerning for significant atherosclerotic changes.  Neurology consulted and recommended outpatient follow-up. She was found to have a UTI and ceftriaxone and then Keflex. She was also noted to have new onset a fib and started on Eliquis after clearance by gastroenterology.   Hospital course complicated by acute metabolic encephalopathy and acute urinary retention.  Encephalopathy resolved.  She failed voiding trial prior to discharge.  Discharged with Foley catheter.  Recommend voiding trial at SNF in about a week.  Patient was evaluated by PT/OT who recommended SNF.  See individual problem list below for more on hospital course.   Subjective: No major events overnight of this morning.  No complaint this morning.  She denies chest pain, dyspnea, nausea, vomiting, abdominal pain or focal neuro symptoms.  Discharge Diagnoses:  Acute metabolic encephalopathy/slurred speech: Resolved.  Oriented x4 now.  No focal neuro deficit.  CVA work-up including CT head, MRI brain, TTE and carotid Doppler without acute finding. MRA head with intracranial atherosclerotic disease which could be severe but motion degraded.  Neurology recommended outpatient follow-up.  Not on sedating medications.  She also have Klebsiella UTI for which she is on Keflex. -Treat treatable causes -Frequent reorientation  and delirium precautions -Outpatient neurology follow-up  Fall at home Left-sided rib fractures-no flail  chest -Conservative care per trauma surgery -PT/OT/IS-SNF for ongoing therapy -pain control-she has not needed other than Tylenol.  Symptomatic anemia: FOBT negative. -Hgb 5.2 (admit)> 2 units> 8.8 (exaggerated)> 7.5>> 7.3>7.3 -EGD with severe ulcerative esophagitis, GE junction stricture (biopsy negative for dysplasia) -Continue Protonix twice daily per GI-outpatient GI follow-up in 6 to 8 weeks -Eliquis resumed after discussion between GI and previous attending. -Check CBC at follow-up.  Klebsiella UTI -Ceftriaxone 10/25-10/27.  Keflex 10/27>> 10/31  Acute urinary retention:  has history of incontinence. -Failed voiding trial.  Replaced Foley. -Attempt voiding trial in a week.  Uncontrolled DM-2 with hypo and hyperglycemia: A1c 5.2% though could be falsely low after transfusion. CBG (last 3)  Recent Labs (last 2 labs)        Recent Labs    01/06/19 2134 01/07/19 0605 01/07/19 1057  GLUCAP 249* 192* 231*    -Discharged on home Lantus 20 units nightly. -Continue home statin.  New onset paroxysmal A. fib?: Now in NSR.  CHA2DS2-VASc score 5.  Started on anticoagulation but high risk for fall.  Echo with EF of 60 to 65%.  TSH normal. -Cardizem discontinued due to bradycardia.  Remains rate controlled without medication. -Started on Eliquis.  H&H has been stable.  Although she has fall leading to this admission, she is mostly wheelchair-bound.  We have discussed risk and benefits as below and she would like to continue Eliquis and rediscuss this again when she follows up with cardiology.  Discontinued aspirin.   Hypothyroidism: TSH within normal -Continue home Synthroid  Essential hypertension: Normotensive -Continue current regimen  Abnormal lesion on left kidney: incidental finding of and indeterminate lesion in the left mid kidney measuring 16 mm is unchanged from CT 1 year ago -Doubt need for follow-up imaging on this.  OSA on CPAP -Continue nightly  CPAP  CKD-3: Stable  History of breast cancer in remission   Severe protein calorie malnutrition Nutrition Problem: Increased nutrient needs Etiology: acute illness  Signs/Symptoms: estimated needs  Interventions: Ensure Enlive (each supplement provides 350kcal and 20 grams of protein), MVI    Discharge Instructions  Discharge Instructions    Diet - low sodium heart healthy   Complete by: As directed    Diet Carb Modified   Complete by: As directed    Increase activity slowly   Complete by: As directed      Allergies as of 01/07/2019      Reactions   Codeine Sulfate Nausea Only      Medication List    STOP taking these medications   aspirin 81 MG tablet   glimepiride 2 MG tablet Commonly known as: AMARYL   traMADol 50 MG tablet Commonly known as: ULTRAM   verapamil 240 MG CR tablet Commonly known as: CALAN-SR     TAKE these medications   acetaminophen 500 MG tablet Commonly known as: TYLENOL Take 2 tablets (1,000 mg total) by mouth every 8 (eight) hours as needed for mild pain, moderate pain or headache.   apixaban 2.5 MG Tabs tablet Commonly known as: ELIQUIS Take 1 tablet (2.5 mg total) by mouth 2 (two) times daily.   cephALEXin 500 MG capsule Commonly known as: KEFLEX Take 1 capsule (500 mg total) by mouth every 12 (twelve) hours.   Combigan 0.2-0.5 % ophthalmic solution Generic drug: brimonidine-timolol Place 1 drop into both eyes every 12 (twelve) hours.   feeding supplement (ENSURE ENLIVE) Liqd Take  237 mLs by mouth 2 (two) times daily between meals.   ferrous sulfate 325 (65 FE) MG tablet Take 1 tablet (325 mg total) by mouth 2 (two) times daily with a meal.   FLUoxetine 20 MG capsule Commonly known as: PROZAC Take 20 mg by mouth daily.   insulin glargine 100 UNIT/ML injection Commonly known as: LANTUS Inject 20 Units into the skin at bedtime. Sliding scale   levothyroxine 50 MCG tablet Commonly known as: SYNTHROID Take 50 mcg  by mouth daily.   losartan 25 MG tablet Commonly known as: COZAAR Take 1 tablet (25 mg total) by mouth daily. NEEDS APPOINTMENT FOR FUTURE REFILLS What changed:   medication strength  how much to take   pantoprazole 40 MG tablet Commonly known as: PROTONIX Take 1 tablet (40 mg total) by mouth 2 (two) times daily before a meal.   polyethylene glycol 17 g packet Commonly known as: MIRALAX / GLYCOLAX Take 17 g by mouth 2 (two) times daily as needed for moderate constipation.   PreserVision AREDS 2 Caps Take 2 capsules by mouth daily.   PREVAGEN PO Take 1 capsule by mouth daily.   rosuvastatin 20 MG tablet Commonly known as: CRESTOR Take 20 mg by mouth daily.   senna 8.6 MG Tabs tablet Commonly known as: SENOKOT Take 1 tablet (8.6 mg total) by mouth daily as needed for mild constipation.       Consultations:  Gastroenterology  Neurology  Procedures/Studies:  2D Echo on 12/31/2018 1. Left ventricular ejection fraction, by visual estimation, is 60 to 65%. The left ventricle has normal function. Normal left ventricular size. There is no left ventricular hypertrophy.  2. Left ventricular diastolic function could not be evaluated pattern of LV diastolic filling.  3. Global right ventricle has normal systolic function.The right ventricular size is not well visualized. No increase in right ventricular wall thickness.  4. Left atrial size was normal.  5. Right atrial size was not well visualized.  6. Moderate mitral annular calcification.  7. The mitral valve was not well visualized. Trace mitral valve regurgitation. No evidence of mitral stenosis.  8. The tricuspid valve is normal in structure. Tricuspid valve regurgitation was not visualized by color flow Doppler.  9. The aortic valve is normal in structure. Aortic valve regurgitation was not visualized by color flow Doppler. Structurally normal aortic valve, with no evidence of sclerosis or stenosis. 10. The pulmonic  valve was not well visualized. Pulmonic valve regurgitation is not visualized by color flow Doppler. 11. TR signal is inadequate for assessing pulmonary artery systolic pressure.  Dg Ribs Unilateral W/chest Left  Result Date: 12/30/2018 CLINICAL DATA:  Left-sided rib pain fall EXAM: LEFT RIBS AND CHEST - 3+ VIEW COMPARISON:  07/12/2010 FINDINGS: Single-view chest demonstrates no acute airspace disease. There may be small left pleural effusion. Mild cardiomegaly with aortic atherosclerosis. No pneumothorax. Clips in the right axillary region. Left rib series demonstrates old appearing left fifth and 6 rib fractures. Acute mildly displaced left seventh, eighth, ninth and tenth anterolateral rib fractures. IMPRESSION: 1. Acute displaced left seventh through tenth rib fractures with adjacent small left effusion. 2. Negative for pneumothorax 3. Cardiomegaly Electronically Signed   By: Donavan Foil M.D.   On: 12/30/2018 22:23   Ct Head Wo Contrast  Result Date: 12/31/2018 CLINICAL DATA:  Head trauma, ataxia EXAM: CT HEAD, cervical spine WITHOUT CONTRAST TECHNIQUE: Contiguous axial images were obtained from the base of the skull through the vertex, cervical spine without intravenous contrast. COMPARISON:  December 06, 2017 FINDINGS: Brain: No evidence of acute territorial infarction, hemorrhage, hydrocephalus,extra-axial collection or mass lesion/mass effect. There is dilatation the ventricles and sulci consistent with age-related atrophy. Low-attenuation changes in the deep white matter consistent with small vessel ischemia. Vascular: No hyperdense vessel or unexpected calcification. Skull: The skull is intact. No fracture or focal lesion identified. Sinuses/Orbits: The visualized paranasal sinuses are clear. There is fluid within the right mastoid air cell. The orbits and globes intact. Other: Unchanged small 1 cm in right parotid space nodule again identified. Cervical spine: Alignment: Physiologic Skull  base and vertebrae: Visualized skull base is intact. No atlanto-occipital dissociation. The vertebral body heights are well maintained. No fracture or pathologic osseous lesion seen. Soft tissues and spinal canal: The visualized paraspinal soft tissues are unremarkable. No prevertebral soft tissue swelling is seen. The spinal canal is grossly unremarkable, no large epidural collection or significant canal narrowing. Disc levels: Mild disc height loss with disc osteophyte and uncovertebral osteophytes is most notable at C6-C7. Upper chest: Again noted is a calcified nodule within the right thyroid lobe. There is chronic left-sided pleural thickening with a posterior fourth healed rib fracture deformity. Other: None IMPRESSION: No acute intracranial abnormality. Findings consistent with age related atrophy and chronic small vessel ischemia Fluid within the right mastoid air cell as on prior exam. No acute fracture or malalignment of the spine. Electronically Signed   By: Prudencio Pair M.D.   On: 12/31/2018 00:42   Ct Chest W Contrast  Result Date: 12/31/2018 CLINICAL DATA:  Fall with left-sided pain. Rib fractures on radiograph. EXAM: CT CHEST, ABDOMEN, AND PELVIS WITH CONTRAST TECHNIQUE: Multidetector CT imaging of the chest, abdomen and pelvis was performed following the standard protocol during bolus administration of intravenous contrast. CONTRAST:  43mL OMNIPAQUE IOHEXOL 300 MG/ML  SOLN COMPARISON:  Rib radiographs yesterday. Chest abdomen pelvis CT 12/06/2017 FINDINGS: CT CHEST FINDINGS Cardiovascular: No acute vascular injury. Aortic atherosclerosis and tortuosity. Normal heart size with coronary artery calcifications. Small pericardial effusion. Mediastinum/Nodes: No mediastinal hemorrhage or hematoma. No pneumomediastinum. Moderate hiatal hernia with fluid-filled esophagus. Peripherally calcified right thyroid nodule. No adenopathy. Lungs/Pleura: Small left pleural effusion and adjacent compressive  atelectasis. No pneumothorax. Minor dependent atelectasis in the right lower lobe with trace right pleural effusion. Lungs are otherwise clear. Trachea and mainstem bronchi are patent. Musculoskeletal: Motion artifact partially obscures the known left anterior sixth through ninth rib fractures (reported as 7 through tenth rib fractures on radiograph). No radiographically occult rib fracture. No fracture of the sternum or included clavicles and shoulder girdles. No acute fracture of the lumbar spine. Vertebral body hemangioma within T11. Subcutaneous contusion involving the left lateral chest wall. No subcutaneous emphysema. CT ABDOMEN PELVIS FINDINGS Hepatobiliary: No hepatic injury or perihepatic hematoma. Gallbladder is surgically absent. Pancreas: No ductal dilatation or inflammation. Parenchymal atrophy without evidence of injury. Spleen: No splenic injury or perisplenic hematoma. Adrenals/Urinary Tract: No adrenal hemorrhage or renal injury identified. Multiple bilateral renal cysts. Absent renal excretion on delayed phase imaging suggest renal dysfunction. Possible solid lesion in the mid left kidney measuring 16 mm, series 3, image 62, not significantly changed from prior. Bladder is unremarkable. Stomach/Bowel: Moderate to large hiatal hernia. No evidence of bowel injury. No mesenteric hematoma. No bowel wall thickening. Distal colonic diverticulosis without diverticulitis. Appendix not confidently visualized. Vascular/Lymphatic: No vascular injury. Aorto bi-iliac atherosclerosis and tortuosity. No retroperitoneal fluid. The IVC is intact. Portal vein is patent. No suspicious adenopathy. Reproductive: Status post hysterectomy. No adnexal masses. Other:  No free air or free fluid. Mild patchy contusion in the left flank. Musculoskeletal: Chronic L1 compression fracture with vertebral augmentation. Hemangioma within L3 vertebral body. Multilevel degenerative change in the spine. Bony pelvis is intact.  Degenerative change of the pubic symphysis. 1 IMPRESSION: 1. Minimally displaced left anterior sixth through ninth rib fractures. Small left pleural effusion and adjacent compressive atelectasis. No pneumothorax. 2. Subcutaneous contusion of the left lateral chest wall. 3. No additional acute traumatic injury to the chest, abdomen, or pelvis. 4. Moderate to large hiatal hernia with fluid-filled esophagus. Colonic diverticulosis. 5. Absent renal excretion on delayed phase imaging suggests renal dysfunction. Indeterminate lesion in the left mid kidney measuring 16 mm is unchanged from CT 1 year ago. Aortic Atherosclerosis (ICD10-I70.0). Electronically Signed   By: Keith Rake M.D.   On: 12/31/2018 00:37   Ct Cervical Spine Wo Contrast  Result Date: 12/31/2018 CLINICAL DATA:  Head trauma, ataxia EXAM: CT HEAD, cervical spine WITHOUT CONTRAST TECHNIQUE: Contiguous axial images were obtained from the base of the skull through the vertex, cervical spine without intravenous contrast. COMPARISON:  December 06, 2017 FINDINGS: Brain: No evidence of acute territorial infarction, hemorrhage, hydrocephalus,extra-axial collection or mass lesion/mass effect. There is dilatation the ventricles and sulci consistent with age-related atrophy. Low-attenuation changes in the deep white matter consistent with small vessel ischemia. Vascular: No hyperdense vessel or unexpected calcification. Skull: The skull is intact. No fracture or focal lesion identified. Sinuses/Orbits: The visualized paranasal sinuses are clear. There is fluid within the right mastoid air cell. The orbits and globes intact. Other: Unchanged small 1 cm in right parotid space nodule again identified. Cervical spine: Alignment: Physiologic Skull base and vertebrae: Visualized skull base is intact. No atlanto-occipital dissociation. The vertebral body heights are well maintained. No fracture or pathologic osseous lesion seen. Soft tissues and spinal canal: The  visualized paraspinal soft tissues are unremarkable. No prevertebral soft tissue swelling is seen. The spinal canal is grossly unremarkable, no large epidural collection or significant canal narrowing. Disc levels: Mild disc height loss with disc osteophyte and uncovertebral osteophytes is most notable at C6-C7. Upper chest: Again noted is a calcified nodule within the right thyroid lobe. There is chronic left-sided pleural thickening with a posterior fourth healed rib fracture deformity. Other: None IMPRESSION: No acute intracranial abnormality. Findings consistent with age related atrophy and chronic small vessel ischemia Fluid within the right mastoid air cell as on prior exam. No acute fracture or malalignment of the spine. Electronically Signed   By: Prudencio Pair M.D.   On: 12/31/2018 00:42   Mr Angio Head Wo Contrast  Result Date: 01/01/2019 CLINICAL DATA:  Focal neuro deficit. Diabetes and hypertension. Breast cancer. Weakness. Recent fall. EXAM: MRI HEAD WITHOUT CONTRAST MRA HEAD WITHOUT CONTRAST TECHNIQUE: Multiplanar, multiecho pulse sequences of the brain and surrounding structures were obtained without intravenous contrast. Angiographic images of the head were obtained using MRA technique without contrast. COMPARISON:  CT head 12/31/2018 FINDINGS: MRI HEAD FINDINGS Brain: Moderate atrophy. Moderate chronic microvascular ischemic changes in the white matter. Negative for acute infarct.  Negative for hemorrhage or mass. Vascular: Normal arterial flow voids with exception of distal right vertebral artery which is not visualized. This could be congenital or acquired. Skull and upper cervical spine: No focal skeletal lesion Sinuses/Orbits: Mild mucosal edema paranasal sinuses. Large right mastoid effusion. Small left mastoid effusion. Bilateral cataract surgery. Other: Motion degraded study MRA HEAD FINDINGS Image quality degraded by extensive motion. Limited information is available on the study.  Left  vertebral artery supplies the basilar. Right vertebral artery not visualized. Basilar is patent. Posterior cerebral arteries are patent but irregular and likely contain atherosclerotic disease Extensive irregularity in the internal carotid artery bilaterally in the precavernous and cavernous segment. Extensive irregularity in the anterior middle cerebral arteries. Suspect underlying atherosclerotic disease however there is considerable artifact. IMPRESSION: 1. No acute infarct. Atrophy and moderate chronic microvascular ischemia. 2. MRA is degraded by motion with little diagnostic information. Suspect underlying intracranial atherosclerotic disease which could be severe. Electronically Signed   By: Franchot Gallo M.D.   On: 01/01/2019 13:57   Mr Brain Wo Contrast  Result Date: 01/01/2019 CLINICAL DATA:  Focal neuro deficit. Diabetes and hypertension. Breast cancer. Weakness. Recent fall. EXAM: MRI HEAD WITHOUT CONTRAST MRA HEAD WITHOUT CONTRAST TECHNIQUE: Multiplanar, multiecho pulse sequences of the brain and surrounding structures were obtained without intravenous contrast. Angiographic images of the head were obtained using MRA technique without contrast. COMPARISON:  CT head 12/31/2018 FINDINGS: MRI HEAD FINDINGS Brain: Moderate atrophy. Moderate chronic microvascular ischemic changes in the white matter. Negative for acute infarct.  Negative for hemorrhage or mass. Vascular: Normal arterial flow voids with exception of distal right vertebral artery which is not visualized. This could be congenital or acquired. Skull and upper cervical spine: No focal skeletal lesion Sinuses/Orbits: Mild mucosal edema paranasal sinuses. Large right mastoid effusion. Small left mastoid effusion. Bilateral cataract surgery. Other: Motion degraded study MRA HEAD FINDINGS Image quality degraded by extensive motion. Limited information is available on the study. Left vertebral artery supplies the basilar. Right vertebral artery  not visualized. Basilar is patent. Posterior cerebral arteries are patent but irregular and likely contain atherosclerotic disease Extensive irregularity in the internal carotid artery bilaterally in the precavernous and cavernous segment. Extensive irregularity in the anterior middle cerebral arteries. Suspect underlying atherosclerotic disease however there is considerable artifact. IMPRESSION: 1. No acute infarct. Atrophy and moderate chronic microvascular ischemia. 2. MRA is degraded by motion with little diagnostic information. Suspect underlying intracranial atherosclerotic disease which could be severe. Electronically Signed   By: Franchot Gallo M.D.   On: 01/01/2019 13:57   Ct Abdomen Pelvis W Contrast  Result Date: 12/31/2018 CLINICAL DATA:  Fall with left-sided pain. Rib fractures on radiograph. EXAM: CT CHEST, ABDOMEN, AND PELVIS WITH CONTRAST TECHNIQUE: Multidetector CT imaging of the chest, abdomen and pelvis was performed following the standard protocol during bolus administration of intravenous contrast. CONTRAST:  48mL OMNIPAQUE IOHEXOL 300 MG/ML  SOLN COMPARISON:  Rib radiographs yesterday. Chest abdomen pelvis CT 12/06/2017 FINDINGS: CT CHEST FINDINGS Cardiovascular: No acute vascular injury. Aortic atherosclerosis and tortuosity. Normal heart size with coronary artery calcifications. Small pericardial effusion. Mediastinum/Nodes: No mediastinal hemorrhage or hematoma. No pneumomediastinum. Moderate hiatal hernia with fluid-filled esophagus. Peripherally calcified right thyroid nodule. No adenopathy. Lungs/Pleura: Small left pleural effusion and adjacent compressive atelectasis. No pneumothorax. Minor dependent atelectasis in the right lower lobe with trace right pleural effusion. Lungs are otherwise clear. Trachea and mainstem bronchi are patent. Musculoskeletal: Motion artifact partially obscures the known left anterior sixth through ninth rib fractures (reported as 7 through tenth rib  fractures on radiograph). No radiographically occult rib fracture. No fracture of the sternum or included clavicles and shoulder girdles. No acute fracture of the lumbar spine. Vertebral body hemangioma within T11. Subcutaneous contusion involving the left lateral chest wall. No subcutaneous emphysema. CT ABDOMEN PELVIS FINDINGS Hepatobiliary: No hepatic injury or perihepatic hematoma. Gallbladder is surgically absent. Pancreas: No ductal dilatation or inflammation. Parenchymal atrophy without  evidence of injury. Spleen: No splenic injury or perisplenic hematoma. Adrenals/Urinary Tract: No adrenal hemorrhage or renal injury identified. Multiple bilateral renal cysts. Absent renal excretion on delayed phase imaging suggest renal dysfunction. Possible solid lesion in the mid left kidney measuring 16 mm, series 3, image 62, not significantly changed from prior. Bladder is unremarkable. Stomach/Bowel: Moderate to large hiatal hernia. No evidence of bowel injury. No mesenteric hematoma. No bowel wall thickening. Distal colonic diverticulosis without diverticulitis. Appendix not confidently visualized. Vascular/Lymphatic: No vascular injury. Aorto bi-iliac atherosclerosis and tortuosity. No retroperitoneal fluid. The IVC is intact. Portal vein is patent. No suspicious adenopathy. Reproductive: Status post hysterectomy. No adnexal masses. Other: No free air or free fluid. Mild patchy contusion in the left flank. Musculoskeletal: Chronic L1 compression fracture with vertebral augmentation. Hemangioma within L3 vertebral body. Multilevel degenerative change in the spine. Bony pelvis is intact. Degenerative change of the pubic symphysis. 1 IMPRESSION: 1. Minimally displaced left anterior sixth through ninth rib fractures. Small left pleural effusion and adjacent compressive atelectasis. No pneumothorax. 2. Subcutaneous contusion of the left lateral chest wall. 3. No additional acute traumatic injury to the chest, abdomen, or  pelvis. 4. Moderate to large hiatal hernia with fluid-filled esophagus. Colonic diverticulosis. 5. Absent renal excretion on delayed phase imaging suggests renal dysfunction. Indeterminate lesion in the left mid kidney measuring 16 mm is unchanged from CT 1 year ago. Aortic Atherosclerosis (ICD10-I70.0). Electronically Signed   By: Keith Rake M.D.   On: 12/31/2018 00:37   Vas US Carotid  Result Date: 01/01/2019 Carotid Arterial Duplex Study Indications:  Stroke like symptoms. Risk Factors: Hypertension, hyperlipidemia, Diabetes. Performing Technologist: Abram Sander RVS  Examination Guidelines: A complete evaluation includes B-mode imaging, spectral Doppler, color Doppler, and power Doppler as needed of all accessible portions of each vessel. Bilateral testing is considered an integral part of a complete examination. Limited examinations for reoccurring indications may be performed as noted.  Right Carotid Findings: +----------+--------+--------+--------+------------------+--------+             PSV cm/s EDV cm/s Stenosis Plaque Description Comments  +----------+--------+--------+--------+------------------+--------+  CCA Prox   60       6                 heterogenous                 +----------+--------+--------+--------+------------------+--------+  CCA Distal 53       7                 heterogenous                 +----------+--------+--------+--------+------------------+--------+  ICA Prox   40       8        1-39%    heterogenous                 +----------+--------+--------+--------+------------------+--------+  ICA Distal 59       14                                             +----------+--------+--------+--------+------------------+--------+  ECA        54                                                      +----------+--------+--------+--------+------------------+--------+ +----------+--------+-------+--------+-------------------+  PSV cm/s EDV cms Describe Arm Pressure (mmHG)   +----------+--------+-------+--------+-------------------+  Subclavian 74                                             +----------+--------+-------+--------+-------------------+ +---------+--------+--+--------+--+---------+  Vertebral PSV cm/s 61 EDV cm/s 14 Antegrade  +---------+--------+--+--------+--+---------+  Left Carotid Findings: +----------+--------+--------+--------+------------------+--------+             PSV cm/s EDV cm/s Stenosis Plaque Description Comments  +----------+--------+--------+--------+------------------+--------+  CCA Prox   83       18                heterogenous                 +----------+--------+--------+--------+------------------+--------+  CCA Distal 45       10                heterogenous                 +----------+--------+--------+--------+------------------+--------+  ICA Prox   47       9        1-39%    heterogenous                 +----------+--------+--------+--------+------------------+--------+  ICA Distal 59       19                                             +----------+--------+--------+--------+------------------+--------+  ECA        46       6                                              +----------+--------+--------+--------+------------------+--------+ +----------+--------+--------+--------+-------------------+             PSV cm/s EDV cm/s Describe Arm Pressure (mmHG)  +----------+--------+--------+--------+-------------------+  Subclavian 63                                              +----------+--------+--------+--------+-------------------+ +---------+--------+--+--------+--+---------+  Vertebral PSV cm/s 49 EDV cm/s 10 Antegrade  +---------+--------+--+--------+--+---------+  Summary: Right Carotid: Velocities in the right ICA are consistent with a 1-39% stenosis. Left Carotid: Velocities in the left ICA are consistent with a 1-39% stenosis. Vertebrals: Bilateral vertebral arteries demonstrate antegrade flow. *See table(s) above for measurements and  observations.  Electronically signed by Monica Martinez MD on 01/01/2019 at 6:37:19 PM.    Final       Discharge Exam: Vitals:   01/07/19 0435 01/07/19 1059  BP: 113/69 (!) 106/51  Pulse: 73 63  Resp: 19 18  Temp: 97.7 F (36.5 C) 97.6 F (36.4 C)  SpO2: 97% 98%    GENERAL: No acute distress.  Appears well.  HEENT: MMM.  Vision and hearing grossly intact.  NECK: Supple.  No apparent JVD.  RESP:  No IWOB. Good air movement bilaterally. CVS:  RRR. Heart sounds normal.  ABD/GI/GU: Bowel sounds present. Soft. Non tender.  MSK/EXT:  Moves extremities. No apparent deformity or edema.  SKIN: no apparent skin lesion or  wound NEURO: Awake, alert and oriented x4.  No apparent focal neuro deficit other than mild dysarthria. PSYCH: Calm. Normal affect.   The results of significant diagnostics from this hospitalization (including imaging, microbiology, ancillary and laboratory) are listed below for reference.     Microbiology: Recent Results (from the past 240 hour(s))  SARS CORONAVIRUS 2 (TAT 6-24 HRS) Nasopharyngeal Nasopharyngeal Swab     Status: None   Collection Time: 12/30/18 11:32 PM   Specimen: Nasopharyngeal Swab  Result Value Ref Range Status   SARS Coronavirus 2 NEGATIVE NEGATIVE Final    Comment: (NOTE) SARS-CoV-2 target nucleic acids are NOT DETECTED. The SARS-CoV-2 RNA is generally detectable in upper and lower respiratory specimens during the acute phase of infection. Negative results do not preclude SARS-CoV-2 infection, do not rule out co-infections with other pathogens, and should not be used as the sole basis for treatment or other patient management decisions. Negative results must be combined with clinical observations, patient history, and epidemiological information. The expected result is Negative. Fact Sheet for Patients: SugarRoll.be Fact Sheet for Healthcare Providers: https://www.woods-mathews.com/ This test  is not yet approved or cleared by the [place] FDA and  has been authorized for detection and/or diagnosis of SARS-CoV-2 by FDA under an Emergency Use Authorization (EUA). This EUA will remain  in effect (meaning this test can be used) for the duration of the COVID-19 declaration under Section 56 4(b)(1) of the Act, 21 U.S.C. section 360bbb-3(b)(1), unless the authorization is terminated or revoked sooner. Performed at [facility], [place] [address], [place], [place] [address]   Culture, Urine     Status: Abnormal   Collection Time: 01/02/19  7:46 AM   Specimen: Urine, Random  Result Value Ref Range Status   Specimen Description URINE, RANDOM  Final   Special Requests   Final    NONE Performed at [facility], [place] [address] [place], [place] [address]    Culture 80,000 COLONIES/mL KLEBSIELLA PNEUMONIAE (A)  Final   Report Status 01/04/2019 FINAL  Final   Organism ID, Bacteria KLEBSIELLA PNEUMONIAE (A)  Final      Susceptibility   Klebsiella pneumoniae - MIC*    AMPICILLIN >=32 RESISTANT Resistant     CEFAZOLIN <=4 SENSITIVE Sensitive     CEFTRIAXONE <=1 SENSITIVE Sensitive     CIPROFLOXACIN <=0.25 SENSITIVE Sensitive     GENTAMICIN <=1 SENSITIVE Sensitive     IMIPENEM 2 SENSITIVE Sensitive     NITROFURANTOIN 32 SENSITIVE Sensitive     TRIMETH/SULFA <=20 SENSITIVE Sensitive     AMPICILLIN/SULBACTAM <=2 SENSITIVE Sensitive     PIP/TAZO <=4 SENSITIVE Sensitive     Extended ESBL NEGATIVE Sensitive     * 80,000 COLONIES/mL KLEBSIELLA PNEUMONIAE  SARS CORONAVIRUS 2 (TAT 6-24 HRS) Nasopharyngeal Nasopharyngeal Swab     Status: None   Collection Time: 01/04/19  4:47 PM   Specimen: Nasopharyngeal Swab  Result Value Ref Range Status   SARS Coronavirus 2 NEGATIVE NEGATIVE Final    Comment: (NOTE) SARS-CoV-2 target nucleic acids are NOT DETECTED. The SARS-CoV-2 RNA is generally detectable in upper and lower respiratory specimens during the acute phase of infection.  Negative results do not preclude SARS-CoV-2 infection, do not rule out co-infections with other pathogens, and should not be used as the sole basis for treatment or other patient management decisions. Negative results must be combined with clinical observations, patient history, and epidemiological information. The expected result is Negative. Fact Sheet for Patients: SugarRoll.be Fact Sheet for  Healthcare Providers: https://www.woods-mathews.com/ This test is not yet approved or cleared by the [place] and  has been authorized for detection and/or diagnosis of SARS-CoV-2 by FDA under an Emergency Use Authorization (EUA). This EUA will remain  in effect (meaning this test can be used) for the duration of the COVID-19 declaration under Section 56 4(b)(1) of the Act, 21 U.S.C. section 360bbb-3(b)(1), unless the authorization is terminated or revoked sooner. Performed at [facility], [place] [address] [place], [place] [address]      Labs: BNP (last 3 results) Recent Labs    12/31/18 1844  BNP [id number]*   Basic Metabolic Panel: Recent Labs  Lab 01/02/19 0350 01/03/19 0523 01/04/19 0330 01/05/19 0403 01/06/19 0329 01/07/19 0305  NA 133* 133* 128* 131* 133*  --   K 3.9 4.1 4.4 4.9 4.8  --   CL 103 103 99 103 104  --   CO2 23 21* 21* 21* 22  --   GLUCOSE 95 129* 172* 240* 187*  --   BUN 19 19 22  26* 23  --   CREATININE 1.23* 1.44* 1.48* 1.58* 1.25*  --   CALCIUM 8.1* 8.4* 8.3* 8.1* 8.1*  --   MG 1.9 1.9 1.8 1.7 1.7 1.7  PHOS  --   --   --   --  3.8  --    Liver Function Tests: Recent Labs  Lab 01/01/19 0319 01/02/19 0350 01/03/19 0523 01/04/19 0330 01/05/19 0403  AST 21 19 19 15 28   ALT 16 16 17 16 23   ALKPHOS 63 57 53 58 62  BILITOT 1.2 0.8 0.5 0.7 0.2*  PROT 5.0* 4.9* 4.8* 4.9* 4.6*  ALBUMIN 2.3* 2.2* 2.1* 2.0* 1.9*   No results for input(s): LIPASE, AMYLASE in the last 168 hours. No results for input(s):  AMMONIA in the last 168 hours. CBC: Recent Labs  Lab 01/02/19 0350  01/03/19 0523 01/03/19 1906 01/04/19 0330 01/05/19 0403 01/06/19 0329  WBC 9.2  --  8.1  --  9.4 6.9 9.5  HGB 7.5*   < > 7.2* 7.9* 7.5* 7.3* 7.3*  HCT 25.4*   < > 24.7* 27.0* 26.0* 25.7* 25.5*  MCV 70.4*  --  71.4*  --  72.6* 74.5* 74.8*  PLT 336  --  314  --  313 301 317   < > = values in this interval not displayed.   Cardiac Enzymes: No results for input(s): CKTOTAL, CKMB, CKMBINDEX, TROPONINI in the last 168 hours. BNP: Invalid input(s): POCBNP CBG: Recent Labs  Lab 01/06/19 1337 01/06/19 1733 01/06/19 2134 01/07/19 0605 01/07/19 1057  GLUCAP 237* 200* 249* 192* 231*   D-Dimer No results for input(s): DDIMER in the last 72 hours. Hgb A1c No results for input(s): HGBA1C in the last 72 hours. Lipid Profile No results for input(s): CHOL, HDL, LDLCALC, TRIG, CHOLHDL, LDLDIRECT in the last 72 hours. Thyroid function studies No results for input(s): TSH, T4TOTAL, T3FREE, THYROIDAB in the last 72 hours.  Invalid input(s): FREET3 Anemia work up No results for input(s): VITAMINB12, FOLATE, FERRITIN, TIBC, IRON, RETICCTPCT in the last 72 hours. Urinalysis    Component Value Date/Time   COLORURINE YELLOW 12/07/2017 0021   APPEARANCEUR CLEAR 12/07/2017 0021   LABSPEC 1.030 12/07/2017 0021   PHURINE 6.0 12/07/2017 0021   GLUCOSEU NEGATIVE 12/07/2017 0021   HGBUR MODERATE (A) 12/07/2017 0021   BILIRUBINUR NEGATIVE 12/07/2017 0021   KETONESUR NEGATIVE 12/07/2017 0021   PROTEINUR 30 (A) 12/07/2017 0021   UROBILINOGEN  0.2 07/05/2009 0331   NITRITE POSITIVE (A) 12/07/2017 0021   LEUKOCYTESUR SMALL (A) 12/07/2017 0021   Sepsis Labs Invalid input(s): PROCALCITONIN,  WBC,  LACTICIDVEN   Time coordinating discharge: 45 minutes  SIGNED:  Mercy Riding, MD  [facility] 01/07/2019, 12:37 PM  If 7PM-7AM, please contact night-coverage [email or web address] Password [email or web address]

## 2019-01-07 NOTE — Progress Notes (Signed)
Discharge instructions and report given to RN at [facility]. Belongings packed up. [facility] to transfer patient to facility.   Arletta Bale, RN

## 2019-01-07 NOTE — Progress Notes (Signed)
PROGRESS NOTE  Diana [name] [id number] DOB: [birth year]   PCP: [name], PA-C  Patient is from: Home  DOA: 12/30/2018 LOS: 6  Brief Narrative / Interim history: 85 year old female with history of DM-2, HTN, hypothyroidism, breast cancer in remission, OSA on CPAP and anemia presenting with generalized weakness and fall at home.  Mildly confused and hypoglycemic to 44 when EMS arrived.  In ED, Hgb 5.  2.  COVID-19 negative.  FOBT negative.  EKG revealed rate controlled A. fib.  CT head/cervical spine/chest/abdomen/pelvis revealed left-sided rib fractures with contusion of left chest wall but no other acute finding.  Trauma consulted.  She was admitted for recurrent falls, hypoglycemia, and generalized weakness.  She was found to have significant anemia and was transfused.  She had severe ulcerative esophagitis on endoscopy and is now on a PPI BID. She was noted to have slurred speech on hospital day 1 and worked up for stroke which was negative.  She was found to have a UTI and is being treated with keflex.  She was also noted to have new onset a fib and is now on anticoagulation.  Discharge currently pending SNF placement and insurance authorization.  Subjective: No major events overnight of this morning.  No complaint this morning.  Denies chest pain, dyspnea, GI or UTI symptoms.    Objective: Vitals:   01/06/19 0800 01/06/19 2035 01/07/19 0435 01/07/19 1059  BP: (!) 152/64 (!) 133/59 113/69 (!) 106/51  Pulse:  76 73 63  Resp:  18 19 18   Temp: 98.1 F (36.7 C) 98.1 F (36.7 C) 97.7 F (36.5 C) 97.6 F (36.4 C)  TempSrc: Oral Oral Oral Oral  SpO2:  96% 97% 98%  Weight:      Height:        Intake/Output Summary (Last 24 hours) at 01/07/2019 1126 Last data filed at 01/07/2019 0439 Gross per 24 hour  Intake -  Output 1100 ml  Net -1100 ml   Filed Weights   01/01/19 0818 01/04/19 0505  Weight: 86.2 kg 90.9 kg    Examination:   GENERAL: No acute  distress.  Appears well.  HEENT: MMM.  Vision and hearing grossly intact.  NECK: Supple.  No apparent JVD.  RESP:  No IWOB. Good air movement bilaterally. CVS:  RRR. Heart sounds normal.  ABD/GI/GU: Bowel sounds present. Soft. Non tender.  Intraurethral Foley. MSK/EXT:  Moves extremities. No apparent deformity.  1+ edema bilaterally. SKIN: no apparent skin lesion or wound NEURO: Awake, alert and oriented x4 no apparent focal neuro deficit. PSYCH: Calm. Normal affect.  Assessment & Plan: Acute metabolic encephalopathy/slurred speech: Resolved.  Oriented x4 now.  No focal neuro deficit.  CVA work-up including CT head, MRI brain, TTE and carotid Doppler without acute finding. MRA head with intracranial atherosclerotic disease which could be severe but motion degraded.  Neurology recommended outpatient follow-up.  Not on sedating medications.  She also have Klebsiella UTI for which she is on Keflex. -Treat treatable causes -Frequent reorientation and delirium precautions -Outpatient neurology follow-up  Fall at home Left-sided rib fractures-no flail chest -Conservative care per trauma surgery -PT/OT/IS and pain control.  Symptomatic anemia: FOBT negative. -Hgb 5.2 (admit)> 2 units> 8.8 (exaggerated)> 7.5>> 7.3 -EGD with severe ulcerative esophagitis, GE junction stricture (biopsy negative for dysplasia) -Continue Protonix twice daily per GI-outpatient GI follow-up in 6 to 8 weeks -Eliquis resumed after discussion between GI and previous attending.  Klebsiella UTI -Ceftriaxone 10/25-10/27.  Keflex 10/27>> 10/31  Urinary retention? -Discontinue Foley  and voiding trial.  Uncontrolled DM-2 with hypo and hyperglycemia: A1c 5.2% though could be falsely low after transfusion. CBG (last 3)  Recent Labs    01/06/19 2134 01/07/19 0605 01/07/19 1057  GLUCAP 249* 192* 231*  -Continue Lantus to 10 units -Add NovoLog 3 units AC if she eats more than 50% of her meals. -Continue SSI and CBG  monitoring  New onset paroxysmal A. fib?: Now in NSR.  CHA2DS2-VASc score 5.  Started on anticoagulation but high risk for fall.  Echo with EF of 60 to 65%.  TSH normal. -Cardizem discontinued due to bradycardia -Started on Eliquis-doubt candidacy given recurrent fall.  May have to discontinue on discharge.  Hypothyroidism: TSH within normal -Continue home Synthroid  Essential hypertension: Normotensive -Continue current regimen  Abnormal lesion on left kidney: incidental finding on CT scan. -Outpatient follow-up  OSA on CPAP -Continue nightly CPAP  CKD-3: Stable  History of breast cancer in remission    Severe protein calorie malnutrition Nutrition Problem: Increased nutrient needs Etiology: acute illness  Signs/Symptoms: estimated needs  Interventions: Ensure Enlive (each supplement provides 350kcal and 20 grams of protein), MVI   DVT prophylaxis: On Eliquis for A. fib Code Status: Full code Family Communication: Patient and/or RN. Available if any question. Disposition Plan: Medically stable for discharge pending SNF bed and insurance authorization. Consultants: Neurology, trauma surgery, both signed off.  Procedures:  None  Microbiology summarized: [id number] negative 10/22 and 10/27 Urine culture with Klebsiella pneumonia.   Sch Meds:  Scheduled Meds: . sodium chloride   Intravenous Once  . apixaban  2.5 mg Oral BID  . brimonidine  1 drop Both Eyes BID   And  . timolol  1 drop Both Eyes BID  . cephALEXin  500 mg Oral Q12H  . Chlorhexidine Gluconate Cloth  6 each Topical Daily  . feeding supplement (GLUCERNA SHAKE)  237 mL Oral TID BM  . ferrous sulfate  325 mg Oral BID WC  . FLUoxetine  20 mg Oral Daily  . insulin aspart  0-5 Units Subcutaneous QHS  . insulin aspart  0-9 Units Subcutaneous TID WC  . insulin glargine  8 Units Subcutaneous QHS  . levothyroxine  50 mcg Oral Daily  . multivitamin with minerals  1 tablet Oral Daily  . pantoprazole  40 mg  Oral BID AC  . polyethylene glycol  17 g Oral BID  . rosuvastatin  20 mg Oral Daily  . senna  1 tablet Oral Daily  . tamsulosin  0.4 mg Oral Daily   Continuous Infusions:  PRN Meds:.acetaminophen **OR** acetaminophen, alum & mag hydroxide-simeth, ondansetron **OR** ondansetron (ZOFRAN) IV  Antimicrobials: Anti-infectives (From admission, onward)   Start     Dose/Rate Route Frequency Ordered Stop   01/06/19 2200  cephALEXin (KEFLEX) capsule 500 mg     500 mg Oral Every 12 hours 01/06/19 1045 01/09/19 2359   01/04/19 1700  cephALEXin (KEFLEX) capsule 500 mg  Status:  Discontinued     500 mg Oral Every 8 hours 01/04/19 1041 01/06/19 1045   01/02/19 1800  cefTRIAXone (ROCEPHIN) 1 g in sodium chloride 0.9 % 100 mL IVPB  Status:  Discontinued    Note to Pharmacy: Give after urine culture has been collected   1 g 200 mL/hr over 30 Minutes Intravenous Every 24 hours 01/02/19 1549 01/04/19 1041       I have personally reviewed the following labs and images: CBC: Recent Labs  Lab 01/02/19 0350  01/03/19 0523 01/03/19 1906 01/04/19  0330 01/05/19 0403 01/06/19 0329  WBC 9.2  --  8.1  --  9.4 6.9 9.5  HGB 7.5*   < > 7.2* 7.9* 7.5* 7.3* 7.3*  HCT 25.4*   < > 24.7* 27.0* 26.0* 25.7* 25.5*  MCV 70.4*  --  71.4*  --  72.6* 74.5* 74.8*  PLT 336  --  314  --  313 301 317   < > = values in this interval not displayed.   BMP &GFR Recent Labs  Lab 01/02/19 0350 01/03/19 0523 01/04/19 0330 01/05/19 0403 01/06/19 0329 01/07/19 0305  NA 133* 133* 128* 131* 133*  --   K 3.9 4.1 4.4 4.9 4.8  --   CL 103 103 99 103 104  --   CO2 23 21* 21* 21* 22  --   GLUCOSE 95 129* 172* 240* 187*  --   BUN 19 19 22  26* 23  --   CREATININE 1.23* 1.44* 1.48* 1.58* 1.25*  --   CALCIUM 8.1* 8.4* 8.3* 8.1* 8.1*  --   MG 1.9 1.9 1.8 1.7 1.7 1.7  PHOS  --   --   --   --  3.8  --    Estimated Creatinine Clearance: 40.2 mL/min (A) (by C-G formula based on SCr of 1.25 mg/dL (H)). Liver & Pancreas: Recent  Labs  Lab 01/01/19 0319 01/02/19 0350 01/03/19 0523 01/04/19 0330 01/05/19 0403  AST 21 19 19 15 28   ALT 16 16 17 16 23   ALKPHOS 63 57 53 58 62  BILITOT 1.2 0.8 0.5 0.7 0.2*  PROT 5.0* 4.9* 4.8* 4.9* 4.6*  ALBUMIN 2.3* 2.2* 2.1* 2.0* 1.9*   No results for input(s): LIPASE, AMYLASE in the last 168 hours. No results for input(s): AMMONIA in the last 168 hours. Diabetic: No results for input(s): HGBA1C in the last 72 hours. Recent Labs  Lab 01/06/19 1337 01/06/19 1733 01/06/19 2134 01/07/19 0605 01/07/19 1057  GLUCAP 237* 200* 249* 192* 231*   Cardiac Enzymes: No results for input(s): CKTOTAL, CKMB, CKMBINDEX, TROPONINI in the last 168 hours. No results for input(s): PROBNP in the last 8760 hours. Coagulation Profile: No results for input(s): INR, PROTIME in the last 168 hours. Thyroid Function Tests: No results for input(s): TSH, T4TOTAL, FREET4, T3FREE, THYROIDAB in the last 72 hours. Lipid Profile: No results for input(s): CHOL, HDL, LDLCALC, TRIG, CHOLHDL, LDLDIRECT in the last 72 hours. Anemia Panel: No results for input(s): VITAMINB12, FOLATE, FERRITIN, TIBC, IRON, RETICCTPCT in the last 72 hours. Urine analysis:    Component Value Date/Time   COLORURINE YELLOW 12/07/2017 0021   APPEARANCEUR CLEAR 12/07/2017 0021   LABSPEC 1.030 12/07/2017 0021   PHURINE 6.0 12/07/2017 0021   GLUCOSEU NEGATIVE 12/07/2017 0021   HGBUR MODERATE (A) 12/07/2017 0021   BILIRUBINUR NEGATIVE 12/07/2017 0021   KETONESUR NEGATIVE 12/07/2017 0021   PROTEINUR 30 (A) 12/07/2017 0021   UROBILINOGEN 0.2 07/05/2009 0331   NITRITE POSITIVE (A) 12/07/2017 0021   LEUKOCYTESUR SMALL (A) 12/07/2017 0021   Sepsis Labs: Invalid input(s): PROCALCITONIN, [place]  Microbiology: Recent Results (from the past 240 hour(s))  SARS CORONAVIRUS 2 (TAT 6-24 HRS) Nasopharyngeal Nasopharyngeal Swab     Status: None   Collection Time: 12/30/18 11:32 PM   Specimen: Nasopharyngeal Swab  Result Value  Ref Range Status   SARS Coronavirus 2 NEGATIVE NEGATIVE Final    Comment: (NOTE) SARS-CoV-2 target nucleic acids are NOT DETECTED. The SARS-CoV-2 RNA is generally detectable in upper and lower respiratory specimens during the acute  phase of infection. Negative results do not preclude SARS-CoV-2 infection, do not rule out co-infections with other pathogens, and should not be used as the sole basis for treatment or other patient management decisions. Negative results must be combined with clinical observations, patient history, and epidemiological information. The expected result is Negative. Fact Sheet for Patients: SugarRoll.be Fact Sheet for Healthcare Providers: https://www.woods-mathews.com/ This test is not yet approved or cleared by the [place] FDA and  has been authorized for detection and/or diagnosis of SARS-CoV-2 by FDA under an Emergency Use Authorization (EUA). This EUA will remain  in effect (meaning this test can be used) for the duration of the COVID-19 declaration under Section 56 4(b)(1) of the Act, 21 U.S.C. section 360bbb-3(b)(1), unless the authorization is terminated or revoked sooner. Performed at [facility], [place] [address], [place], [place] [address]   Culture, Urine     Status: Abnormal   Collection Time: 01/02/19  7:46 AM   Specimen: Urine, Random  Result Value Ref Range Status   Specimen Description URINE, RANDOM  Final   Special Requests   Final    NONE Performed at [facility], [place] [address] [place], [place] [address]    Culture 80,000 COLONIES/mL KLEBSIELLA PNEUMONIAE (A)  Final   Report Status 01/04/2019 FINAL  Final   Organism ID, Bacteria KLEBSIELLA PNEUMONIAE (A)  Final      Susceptibility   Klebsiella pneumoniae - MIC*    AMPICILLIN >=32 RESISTANT Resistant     CEFAZOLIN <=4 SENSITIVE Sensitive     CEFTRIAXONE <=1 SENSITIVE Sensitive     CIPROFLOXACIN <=0.25 SENSITIVE Sensitive      GENTAMICIN <=1 SENSITIVE Sensitive     IMIPENEM 2 SENSITIVE Sensitive     NITROFURANTOIN 32 SENSITIVE Sensitive     TRIMETH/SULFA <=20 SENSITIVE Sensitive     AMPICILLIN/SULBACTAM <=2 SENSITIVE Sensitive     PIP/TAZO <=4 SENSITIVE Sensitive     Extended ESBL NEGATIVE Sensitive     * 80,000 COLONIES/mL KLEBSIELLA PNEUMONIAE  SARS CORONAVIRUS 2 (TAT 6-24 HRS) Nasopharyngeal Nasopharyngeal Swab     Status: None   Collection Time: 01/04/19  4:47 PM   Specimen: Nasopharyngeal Swab  Result Value Ref Range Status   SARS Coronavirus 2 NEGATIVE NEGATIVE Final    Comment: (NOTE) SARS-CoV-2 target nucleic acids are NOT DETECTED. The SARS-CoV-2 RNA is generally detectable in upper and lower respiratory specimens during the acute phase of infection. Negative results do not preclude SARS-CoV-2 infection, do not rule out co-infections with other pathogens, and should not be used as the sole basis for treatment or other patient management decisions. Negative results must be combined with clinical observations, patient history, and epidemiological information. The expected result is Negative. Fact Sheet for Patients: SugarRoll.be Fact Sheet for Healthcare Providers: https://www.woods-mathews.com/ This test is not yet approved or cleared by the [place] FDA and  has been authorized for detection and/or diagnosis of SARS-CoV-2 by FDA under an Emergency Use Authorization (EUA). This EUA will remain  in effect (meaning this test can be used) for the duration of the COVID-19 declaration under Section 56 4(b)(1) of the Act, 21 U.S.C. section 360bbb-3(b)(1), unless the authorization is terminated or revoked sooner. Performed at [facility], [place] [address], [place], [place] [address]     Radiology Studies: No results found.   Loyd Salvador T. [place]  If 7PM-7AM, please contact night-coverage [email or web address] Password [email or web address]  01/07/2019, 11:26 AM

## 2019-01-10 ENCOUNTER — Encounter: Payer: Self-pay | Admitting: Adult Health

## 2019-01-10 ENCOUNTER — Non-Acute Institutional Stay (SKILLED_NURSING_FACILITY): Payer: Medicare Other | Admitting: Adult Health

## 2019-01-10 DIAGNOSIS — K2210 Ulcer of esophagus without bleeding: Secondary | ICD-10-CM

## 2019-01-10 DIAGNOSIS — E039 Hypothyroidism, unspecified: Secondary | ICD-10-CM

## 2019-01-10 DIAGNOSIS — G4733 Obstructive sleep apnea (adult) (pediatric): Secondary | ICD-10-CM

## 2019-01-10 DIAGNOSIS — D649 Anemia, unspecified: Secondary | ICD-10-CM | POA: Diagnosis not present

## 2019-01-10 DIAGNOSIS — G9341 Metabolic encephalopathy: Secondary | ICD-10-CM

## 2019-01-10 DIAGNOSIS — N390 Urinary tract infection, site not specified: Secondary | ICD-10-CM

## 2019-01-10 DIAGNOSIS — R338 Other retention of urine: Secondary | ICD-10-CM

## 2019-01-10 DIAGNOSIS — I4819 Other persistent atrial fibrillation: Secondary | ICD-10-CM

## 2019-01-10 DIAGNOSIS — N289 Disorder of kidney and ureter, unspecified: Secondary | ICD-10-CM

## 2019-01-10 DIAGNOSIS — W19XXXS Unspecified fall, sequela: Secondary | ICD-10-CM

## 2019-01-10 DIAGNOSIS — Z9989 Dependence on other enabling machines and devices: Secondary | ICD-10-CM

## 2019-01-10 DIAGNOSIS — I10 Essential (primary) hypertension: Secondary | ICD-10-CM

## 2019-01-10 DIAGNOSIS — E1165 Type 2 diabetes mellitus with hyperglycemia: Secondary | ICD-10-CM

## 2019-01-10 DIAGNOSIS — Z794 Long term (current) use of insulin: Secondary | ICD-10-CM

## 2019-01-10 NOTE — Progress Notes (Signed)
Location:  [facility] Room Number: 307A Place of Service:  SNF (31) Provider:  Durenda Age, DNP, FNP-BC  Patient Care Team: Windy Canny as PCP - General (Physician Assistant) Troy Sine, MD as PCP - Cardiology (Cardiology)  Extended Emergency Contact Information Primary Emergency Contact: Callicott,Ann Address: [address], [place] Phone: [phone] Relation: Other  Code Status:  Full Code  Goals of care: Advanced Directive information Advanced Directives 01/10/2019  Does Patient Have a Medical Advance Directive? Yes  Type of Advance Directive (No Data)  Does patient want to make changes to medical advance directive? -  Copy of [place] in Chart? -  Would patient like information on creating a medical advance directive? -  Pre-existing out of facility DNR order (yellow form or pink MOST form) -     Chief Complaint  Patient presents with   Hospitalization Follow-up    Hospitalization Follow Up    HPI:  Pt is a 85 y.o. female who has has been admitted to [facility] on 01/07/19 post hospitalization 12/30/18 to 01/07/19. She has PMH of type 2 diabetes mellitus, hypertension, hypothyroidism, breast cancer in remission, OSA on CPAP and anemia.  She presented to the hospital with generalized weakness and recurrent falls.  She was mildly confused and hypoglycemic to 44 when EMS arrived.  In the ED, hemoglobin was 5.2 and was transfused.  She had severe ulcerative esophagitis on endoscopy and was started on PPI twice a day. FOBT negative and EKG revealed rate controlled A. fib.  She was started on Eliquis after clearance with gastroenterology.  CT head/cervical spine/chest/abdomen/pelvis revealed left-sided rib fractures rib contusion of left chest wall but no other acute finding.  Trauma was consulted.  She was noted to have slurred speech on  hospital day 1 and worked up for stroke were negative except for motion degraded MRA head concerning for significant atherosclerotic changes.  She was found to have a UTI and was started on ceftriaxone then Keflex.  Hospitalization was complicated by acute metabolic encephalopathy and acute urinary retention.  Encephalopathy has now resolved.  She was discharged with Foley catheter and recommended to have voiding trial at SNF in 1 week.  She was seen in the room today.  She is alert and oriented x3.  Noted BPs elevated 172/88, 178/88, 170/90, 180/90.  She denies having headache nor any pain.   Past Medical History:  Diagnosis Date   Anemia    Anemia of decreased vitamin B12 absorption 05/26/2005   Arthritis    Breast cancer ([place]) 2001   Cataract    History of tobacco abuse    Hx of colonic polyps    Hyperlipidemia    Hypertension    Hypothyroidism    Obesity    OSA on CPAP 09/2007   AHI 10.6/hr overall, 43.64/hr during REM   Osteopenia    Renal insufficiency    Type 2 diabetes mellitus [facility])    Past Surgical History:  Procedure Laterality Date   BACK SURGERY  05/30/2009   BIOPSY  01/01/2019   Procedure: BIOPSY;  Surgeon: Milus Banister, MD;  Location: [facility];  Service: Endoscopy;;   BOWEL RESECTION  1974   CATARACT EXTRACTION Bilateral    bilateral   CHOLECYSTECTOMY  1974   ESOPHAGOGASTRODUODENOSCOPY N/A 01/01/2019   Procedure: ESOPHAGOGASTRODUODENOSCOPY (EGD);  Surgeon: Milus Banister, MD;  Location[facility]COPY;  Service: Endoscopy;  Laterality: N/A;   MASTECTOMY Bilateral 2001   bilateral sentinel lymph nodes bio   NM MYOCAR PERF WALL MOTION  06/2007   dipyridamole; perfusion defect in inferior myocardium consistent with diaphragmatic attenuation, remaining myocardium with no ischemia/infarct, EF 73%; normal, low risk scan    TOTAL ABDOMINAL HYSTERECTOMY  1975   TRANSTHORACIC ECHOCARDIOGRAM  02/2010   123456, stage 1 diastolic  dysfunction; borderline RV enlargement; LA mild-mod dilated; mild mitral annular calcif & mild MR; mild TR with normal RSVP, AV moderately sclerotics    Allergies  Allergen Reactions   Codeine Sulfate Nausea Only    Outpatient Encounter Medications as of 01/10/2019  Medication Sig   acetaminophen (TYLENOL) 500 MG tablet Take 2 tablets (1,000 mg total) by mouth every 8 (eight) hours as needed for mild pain, moderate pain or headache.   apixaban (ELIQUIS) 2.5 MG TABS tablet Take 1 tablet (2.5 mg total) by mouth 2 (two) times daily.   Apoaequorin (PREVAGEN PO) Take 1 capsule by mouth daily.   COMBIGAN 0.2-0.5 % ophthalmic solution Place 1 drop into both eyes every 12 (twelve) hours.    feeding supplement, ENSURE ENLIVE, (ENSURE ENLIVE) LIQD Take 237 mLs by mouth 2 (two) times daily between meals.   ferrous sulfate 325 (65 FE) MG tablet Take 1 tablet (325 mg total) by mouth 2 (two) times daily with a meal.   FLUoxetine (PROZAC) 20 MG capsule Take 20 mg by mouth daily.    insulin glargine (LANTUS) 100 UNIT/ML injection Inject 20 Units into the skin at bedtime. Sliding scale   levothyroxine (SYNTHROID, LEVOTHROID) 50 MCG tablet Take 50 mcg by mouth daily.     losartan (COZAAR) 25 MG tablet Take 25 mg by mouth daily.   Multiple Vitamins-Minerals (PRESERVISION AREDS 2) CAPS Take 2 capsules by mouth daily.   pantoprazole (PROTONIX) 40 MG tablet Take 1 tablet (40 mg total) by mouth 2 (two) times daily before a meal.   polyethylene glycol (MIRALAX / GLYCOLAX) 17 g packet Take 17 g by mouth 2 (two) times daily as needed for moderate constipation.   rosuvastatin (CRESTOR) 20 MG tablet Take 20 mg by mouth daily.   senna (SENOKOT) 8.6 MG TABS tablet Take 1 tablet (8.6 mg total) by mouth daily as needed for mild constipation.   [DISCONTINUED] cephALEXin (KEFLEX) 500 MG capsule Take 1 capsule (500 mg total) by mouth every 12 (twelve) hours.   [DISCONTINUED] losartan (COZAAR) 25 MG tablet  Take 1 tablet (25 mg total) by mouth daily. NEEDS APPOINTMENT FOR FUTURE REFILLS (Patient taking differently: Take 25 mg by mouth daily. )   No facility-administered encounter medications on file as of 01/10/2019.     Review of Systems  GENERAL: No change in appetite, no fatigue, no weight changes, no fever, chills or weakness MOUTH and THROAT: Denies oral discomfort, gingival pain or bleeding RESPIRATORY: no cough, SOB, DOE, wheezing, hemoptysis CARDIAC: No chest pain, edema or palpitations GI: No abdominal pain, diarrhea, constipation, heart burn, nausea or vomiting GU: urinary retention NEUROLOGICAL: Denies dizziness, syncope, numbness, or headache PSYCHIATRIC: Denies feelings of depression or anxiety. No report of hallucinations, insomnia, paranoia, or agitation    There is no immunization history on file for this patient. Pertinent  Health Maintenance Due  Topic Date Due   FOOT EXAM  07/21/1943   OPHTHALMOLOGY EXAM  07/21/1943   PNA vac Low Risk Adult (1 of 2 - PCV13) 07/21/2000   COLONOSCOPY  07/30/2014   INFLUENZA VACCINE  10/09/2018  HEMOGLOBIN A1C  07/01/2019   DEXA SCAN  Completed   No flowsheet data found.   Vitals:   01/10/19 1459  BP: (!) 168/90  Pulse: (!) 116  Resp: 20  Temp: 98.6 F (37 C)  TempSrc: Oral  SpO2: 96%  Weight: 200 lb 6.4 oz (90.9 kg)  Height: 5\' 8"  (1.727 m)   Body mass index is 30.47 kg/m.  Physical Exam  GENERAL APPEARANCE: Well nourished. In no acute distress. Obese SKIN:  Skin is warm and dry.  MOUTH and THROAT: Lips are without lesions. Oral mucosa is moist and without lesions. Tongue is normal in shape, size, and color and without lesions RESPIRATORY: Breathing is even & unlabored, BS CTAB CARDIAC: RRR, no murmur,no extra heart sounds, no edema GI: Abdomen soft, normal BS, no masses, no tenderness GU:  Has foley catheter draining clear yellowish urine to urine bag EXTREMITIES:  Able to move X 4  extremities NEUROLOGICAL: There is no tremor. Speech is clear. Alert and oriented X 3. PSYCHIATRIC:  Affect and behavior are appropriate  Labs reviewed: Recent Labs    01/04/19 0330 01/05/19 0403 01/06/19 0329 01/07/19 0305  NA 128* 131* 133*  --   K 4.4 4.9 4.8  --   CL 99 103 104  --   CO2 21* 21* 22  --   GLUCOSE 172* 240* 187*  --   BUN 22 26* 23  --   CREATININE 1.48* 1.58* 1.25*  --   CALCIUM 8.3* 8.1* 8.1*  --   MG 1.8 1.7 1.7 1.7  PHOS  --   --  3.8  --    Recent Labs    01/03/19 0523 01/04/19 0330 01/05/19 0403  AST 19 15 28   ALT 17 16 23   ALKPHOS 53 58 62  BILITOT 0.5 0.7 0.2*  PROT 4.8* 4.9* 4.6*  ALBUMIN 2.1* 2.0* 1.9*   Recent Labs    12/30/18 2246  01/04/19 0330 01/05/19 0403 01/06/19 0329  WBC 6.7   < > 9.4 6.9 9.5  NEUTROABS 5.4  --   --   --   --   HGB 5.2*   < > 7.5* 7.3* 7.3*  HCT 20.0*   < > 26.0* 25.7* 25.5*  MCV 62.9*   < > 72.6* 74.5* 74.8*  PLT 402*   < > 313 301 317   < > = values in this interval not displayed.   Lab Results  Component Value Date   TSH 4.185 12/31/2018   Lab Results  Component Value Date   HGBA1C 5.2 12/31/2018   Lab Results  Component Value Date   CHOL  07/05/2009    115        ATP III CLASSIFICATION:  <200     mg/dL   Desirable  200-239  mg/dL   Borderline High  >=240    mg/dL   High          HDL 42 07/05/2009   LDLCALC  07/05/2009    51        Total Cholesterol/HDL:CHD Risk Coronary Heart Disease Risk Table                     Men   Women  1/2 Average Risk   3.4   3.3  Average Risk       5.0   4.4  2 X Average Risk   9.6   7.1  3 X Average Risk  23.4   11.0  Use the calculated Patient Ratio above and the CHD Risk Table to determine the patient's CHD Risk.        ATP III CLASSIFICATION (LDL):  <100     mg/dL   Optimal  100-129  mg/dL   Near or Above                    Optimal  130-159  mg/dL   Borderline  160-189  mg/dL   High  >190     mg/dL   Very High   TRIG 108 07/05/2009    CHOLHDL 2.7 07/05/2009    Significant Diagnostic Results in last 30 days:  Dg Ribs Unilateral W/chest Left  Result Date: 12/30/2018 CLINICAL DATA:  Left-sided rib pain fall EXAM: LEFT RIBS AND CHEST - 3+ VIEW COMPARISON:  07/12/2010 FINDINGS: Single-view chest demonstrates no acute airspace disease. There may be small left pleural effusion. Mild cardiomegaly with aortic atherosclerosis. No pneumothorax. Clips in the right axillary region. Left rib series demonstrates old appearing left fifth and 6 rib fractures. Acute mildly displaced left seventh, eighth, ninth and tenth anterolateral rib fractures. IMPRESSION: 1. Acute displaced left seventh through tenth rib fractures with adjacent small left effusion. 2. Negative for pneumothorax 3. Cardiomegaly Electronically Signed   By: Donavan Foil M.D.   On: 12/30/2018 22:23   Ct Head Wo Contrast  Result Date: 12/31/2018 CLINICAL DATA:  Head trauma, ataxia EXAM: CT HEAD, cervical spine WITHOUT CONTRAST TECHNIQUE: Contiguous axial images were obtained from the base of the skull through the vertex, cervical spine without intravenous contrast. COMPARISON:  December 06, 2017 FINDINGS: Brain: No evidence of acute territorial infarction, hemorrhage, hydrocephalus,extra-axial collection or mass lesion/mass effect. There is dilatation the ventricles and sulci consistent with age-related atrophy. Low-attenuation changes in the deep white matter consistent with small vessel ischemia. Vascular: No hyperdense vessel or unexpected calcification. Skull: The skull is intact. No fracture or focal lesion identified. Sinuses/Orbits: The visualized paranasal sinuses are clear. There is fluid within the right mastoid air cell. The orbits and globes intact. Other: Unchanged small 1 cm in right parotid space nodule again identified. Cervical spine: Alignment: Physiologic Skull base and vertebrae: Visualized skull base is intact. No atlanto-occipital dissociation. The vertebral body  heights are well maintained. No fracture or pathologic osseous lesion seen. Soft tissues and spinal canal: The visualized paraspinal soft tissues are unremarkable. No prevertebral soft tissue swelling is seen. The spinal canal is grossly unremarkable, no large epidural collection or significant canal narrowing. Disc levels: Mild disc height loss with disc osteophyte and uncovertebral osteophytes is most notable at C6-C7. Upper chest: Again noted is a calcified nodule within the right thyroid lobe. There is chronic left-sided pleural thickening with a posterior fourth healed rib fracture deformity. Other: None IMPRESSION: No acute intracranial abnormality. Findings consistent with age related atrophy and chronic small vessel ischemia Fluid within the right mastoid air cell as on prior exam. No acute fracture or malalignment of the spine. Electronically Signed   By: Prudencio Pair M.D.   On: 12/31/2018 00:42   Ct Chest W Contrast  Result Date: 12/31/2018 CLINICAL DATA:  Fall with left-sided pain. Rib fractures on radiograph. EXAM: CT CHEST, ABDOMEN, AND PELVIS WITH CONTRAST TECHNIQUE: Multidetector CT imaging of the chest, abdomen and pelvis was performed following the standard protocol during bolus administration of intravenous contrast. CONTRAST:  59mL OMNIPAQUE IOHEXOL 300 MG/ML  SOLN COMPARISON:  Rib radiographs yesterday. Chest abdomen pelvis CT 12/06/2017 FINDINGS: CT CHEST FINDINGS Cardiovascular: No  acute vascular injury. Aortic atherosclerosis and tortuosity. Normal heart size with coronary artery calcifications. Small pericardial effusion. Mediastinum/Nodes: No mediastinal hemorrhage or hematoma. No pneumomediastinum. Moderate hiatal hernia with fluid-filled esophagus. Peripherally calcified right thyroid nodule. No adenopathy. Lungs/Pleura: Small left pleural effusion and adjacent compressive atelectasis. No pneumothorax. Minor dependent atelectasis in the right lower lobe with trace right pleural  effusion. Lungs are otherwise clear. Trachea and mainstem bronchi are patent. Musculoskeletal: Motion artifact partially obscures the known left anterior sixth through ninth rib fractures (reported as 7 through tenth rib fractures on radiograph). No radiographically occult rib fracture. No fracture of the sternum or included clavicles and shoulder girdles. No acute fracture of the lumbar spine. Vertebral body hemangioma within T11. Subcutaneous contusion involving the left lateral chest wall. No subcutaneous emphysema. CT ABDOMEN PELVIS FINDINGS Hepatobiliary: No hepatic injury or perihepatic hematoma. Gallbladder is surgically absent. Pancreas: No ductal dilatation or inflammation. Parenchymal atrophy without evidence of injury. Spleen: No splenic injury or perisplenic hematoma. Adrenals/Urinary Tract: No adrenal hemorrhage or renal injury identified. Multiple bilateral renal cysts. Absent renal excretion on delayed phase imaging suggest renal dysfunction. Possible solid lesion in the mid left kidney measuring 16 mm, series 3, image 62, not significantly changed from prior. Bladder is unremarkable. Stomach/Bowel: Moderate to large hiatal hernia. No evidence of bowel injury. No mesenteric hematoma. No bowel wall thickening. Distal colonic diverticulosis without diverticulitis. Appendix not confidently visualized. Vascular/Lymphatic: No vascular injury. Aorto bi-iliac atherosclerosis and tortuosity. No retroperitoneal fluid. The IVC is intact. Portal vein is patent. No suspicious adenopathy. Reproductive: Status post hysterectomy. No adnexal masses. Other: No free air or free fluid. Mild patchy contusion in the left flank. Musculoskeletal: Chronic L1 compression fracture with vertebral augmentation. Hemangioma within L3 vertebral body. Multilevel degenerative change in the spine. Bony pelvis is intact. Degenerative change of the pubic symphysis. 1 IMPRESSION: 1. Minimally displaced left anterior sixth through ninth  rib fractures. Small left pleural effusion and adjacent compressive atelectasis. No pneumothorax. 2. Subcutaneous contusion of the left lateral chest wall. 3. No additional acute traumatic injury to the chest, abdomen, or pelvis. 4. Moderate to large hiatal hernia with fluid-filled esophagus. Colonic diverticulosis. 5. Absent renal excretion on delayed phase imaging suggests renal dysfunction. Indeterminate lesion in the left mid kidney measuring 16 mm is unchanged from CT 1 year ago. Aortic Atherosclerosis (ICD10-I70.0). Electronically Signed   By: Keith Rake M.D.   On: 12/31/2018 00:37   Ct Cervical Spine Wo Contrast  Result Date: 12/31/2018 CLINICAL DATA:  Head trauma, ataxia EXAM: CT HEAD, cervical spine WITHOUT CONTRAST TECHNIQUE: Contiguous axial images were obtained from the base of the skull through the vertex, cervical spine without intravenous contrast. COMPARISON:  December 06, 2017 FINDINGS: Brain: No evidence of acute territorial infarction, hemorrhage, hydrocephalus,extra-axial collection or mass lesion/mass effect. There is dilatation the ventricles and sulci consistent with age-related atrophy. Low-attenuation changes in the deep white matter consistent with small vessel ischemia. Vascular: No hyperdense vessel or unexpected calcification. Skull: The skull is intact. No fracture or focal lesion identified. Sinuses/Orbits: The visualized paranasal sinuses are clear. There is fluid within the right mastoid air cell. The orbits and globes intact. Other: Unchanged small 1 cm in right parotid space nodule again identified. Cervical spine: Alignment: Physiologic Skull base and vertebrae: Visualized skull base is intact. No atlanto-occipital dissociation. The vertebral body heights are well maintained. No fracture or pathologic osseous lesion seen. Soft tissues and spinal canal: The visualized paraspinal soft tissues are unremarkable. No prevertebral soft tissue swelling  is seen. The spinal  canal is grossly unremarkable, no large epidural collection or significant canal narrowing. Disc levels: Mild disc height loss with disc osteophyte and uncovertebral osteophytes is most notable at C6-C7. Upper chest: Again noted is a calcified nodule within the right thyroid lobe. There is chronic left-sided pleural thickening with a posterior fourth healed rib fracture deformity. Other: None IMPRESSION: No acute intracranial abnormality. Findings consistent with age related atrophy and chronic small vessel ischemia Fluid within the right mastoid air cell as on prior exam. No acute fracture or malalignment of the spine. Electronically Signed   By: Prudencio Pair M.D.   On: 12/31/2018 00:42   Mr Angio Head Wo Contrast  Result Date: 01/01/2019 CLINICAL DATA:  Focal neuro deficit. Diabetes and hypertension. Breast cancer. Weakness. Recent fall. EXAM: MRI HEAD WITHOUT CONTRAST MRA HEAD WITHOUT CONTRAST TECHNIQUE: Multiplanar, multiecho pulse sequences of the brain and surrounding structures were obtained without intravenous contrast. Angiographic images of the head were obtained using MRA technique without contrast. COMPARISON:  CT head 12/31/2018 FINDINGS: MRI HEAD FINDINGS Brain: Moderate atrophy. Moderate chronic microvascular ischemic changes in the white matter. Negative for acute infarct.  Negative for hemorrhage or mass. Vascular: Normal arterial flow voids with exception of distal right vertebral artery which is not visualized. This could be congenital or acquired. Skull and upper cervical spine: No focal skeletal lesion Sinuses/Orbits: Mild mucosal edema paranasal sinuses. Large right mastoid effusion. Small left mastoid effusion. Bilateral cataract surgery. Other: Motion degraded study MRA HEAD FINDINGS Image quality degraded by extensive motion. Limited information is available on the study. Left vertebral artery supplies the basilar. Right vertebral artery not visualized. Basilar is patent. Posterior  cerebral arteries are patent but irregular and likely contain atherosclerotic disease Extensive irregularity in the internal carotid artery bilaterally in the precavernous and cavernous segment. Extensive irregularity in the anterior middle cerebral arteries. Suspect underlying atherosclerotic disease however there is considerable artifact. IMPRESSION: 1. No acute infarct. Atrophy and moderate chronic microvascular ischemia. 2. MRA is degraded by motion with little diagnostic information. Suspect underlying intracranial atherosclerotic disease which could be severe. Electronically Signed   By: Franchot Gallo M.D.   On: 01/01/2019 13:57   Mr Brain Wo Contrast  Result Date: 01/01/2019 CLINICAL DATA:  Focal neuro deficit. Diabetes and hypertension. Breast cancer. Weakness. Recent fall. EXAM: MRI HEAD WITHOUT CONTRAST MRA HEAD WITHOUT CONTRAST TECHNIQUE: Multiplanar, multiecho pulse sequences of the brain and surrounding structures were obtained without intravenous contrast. Angiographic images of the head were obtained using MRA technique without contrast. COMPARISON:  CT head 12/31/2018 FINDINGS: MRI HEAD FINDINGS Brain: Moderate atrophy. Moderate chronic microvascular ischemic changes in the white matter. Negative for acute infarct.  Negative for hemorrhage or mass. Vascular: Normal arterial flow voids with exception of distal right vertebral artery which is not visualized. This could be congenital or acquired. Skull and upper cervical spine: No focal skeletal lesion Sinuses/Orbits: Mild mucosal edema paranasal sinuses. Large right mastoid effusion. Small left mastoid effusion. Bilateral cataract surgery. Other: Motion degraded study MRA HEAD FINDINGS Image quality degraded by extensive motion. Limited information is available on the study. Left vertebral artery supplies the basilar. Right vertebral artery not visualized. Basilar is patent. Posterior cerebral arteries are patent but irregular and likely contain  atherosclerotic disease Extensive irregularity in the internal carotid artery bilaterally in the precavernous and cavernous segment. Extensive irregularity in the anterior middle cerebral arteries. Suspect underlying atherosclerotic disease however there is considerable artifact. IMPRESSION: 1. No acute infarct. Atrophy and  moderate chronic microvascular ischemia. 2. MRA is degraded by motion with little diagnostic information. Suspect underlying intracranial atherosclerotic disease which could be severe. Electronically Signed   By: Franchot Gallo M.D.   On: 01/01/2019 13:57   Ct Abdomen Pelvis W Contrast  Result Date: 12/31/2018 CLINICAL DATA:  Fall with left-sided pain. Rib fractures on radiograph. EXAM: CT CHEST, ABDOMEN, AND PELVIS WITH CONTRAST TECHNIQUE: Multidetector CT imaging of the chest, abdomen and pelvis was performed following the standard protocol during bolus administration of intravenous contrast. CONTRAST:  75mL OMNIPAQUE IOHEXOL 300 MG/ML  SOLN COMPARISON:  Rib radiographs yesterday. Chest abdomen pelvis CT 12/06/2017 FINDINGS: CT CHEST FINDINGS Cardiovascular: No acute vascular injury. Aortic atherosclerosis and tortuosity. Normal heart size with coronary artery calcifications. Small pericardial effusion. Mediastinum/Nodes: No mediastinal hemorrhage or hematoma. No pneumomediastinum. Moderate hiatal hernia with fluid-filled esophagus. Peripherally calcified right thyroid nodule. No adenopathy. Lungs/Pleura: Small left pleural effusion and adjacent compressive atelectasis. No pneumothorax. Minor dependent atelectasis in the right lower lobe with trace right pleural effusion. Lungs are otherwise clear. Trachea and mainstem bronchi are patent. Musculoskeletal: Motion artifact partially obscures the known left anterior sixth through ninth rib fractures (reported as 7 through tenth rib fractures on radiograph). No radiographically occult rib fracture. No fracture of the sternum or included  clavicles and shoulder girdles. No acute fracture of the lumbar spine. Vertebral body hemangioma within T11. Subcutaneous contusion involving the left lateral chest wall. No subcutaneous emphysema. CT ABDOMEN PELVIS FINDINGS Hepatobiliary: No hepatic injury or perihepatic hematoma. Gallbladder is surgically absent. Pancreas: No ductal dilatation or inflammation. Parenchymal atrophy without evidence of injury. Spleen: No splenic injury or perisplenic hematoma. Adrenals/Urinary Tract: No adrenal hemorrhage or renal injury identified. Multiple bilateral renal cysts. Absent renal excretion on delayed phase imaging suggest renal dysfunction. Possible solid lesion in the mid left kidney measuring 16 mm, series 3, image 62, not significantly changed from prior. Bladder is unremarkable. Stomach/Bowel: Moderate to large hiatal hernia. No evidence of bowel injury. No mesenteric hematoma. No bowel wall thickening. Distal colonic diverticulosis without diverticulitis. Appendix not confidently visualized. Vascular/Lymphatic: No vascular injury. Aorto bi-iliac atherosclerosis and tortuosity. No retroperitoneal fluid. The IVC is intact. Portal vein is patent. No suspicious adenopathy. Reproductive: Status post hysterectomy. No adnexal masses. Other: No free air or free fluid. Mild patchy contusion in the left flank. Musculoskeletal: Chronic L1 compression fracture with vertebral augmentation. Hemangioma within L3 vertebral body. Multilevel degenerative change in the spine. Bony pelvis is intact. Degenerative change of the pubic symphysis. 1 IMPRESSION: 1. Minimally displaced left anterior sixth through ninth rib fractures. Small left pleural effusion and adjacent compressive atelectasis. No pneumothorax. 2. Subcutaneous contusion of the left lateral chest wall. 3. No additional acute traumatic injury to the chest, abdomen, or pelvis. 4. Moderate to large hiatal hernia with fluid-filled esophagus. Colonic diverticulosis. 5. Absent  renal excretion on delayed phase imaging suggests renal dysfunction. Indeterminate lesion in the left mid kidney measuring 16 mm is unchanged from CT 1 year ago. Aortic Atherosclerosis (ICD10-I70.0). Electronically Signed   By: Keith Rake M.D.   On: 12/31/2018 00:37   Vas US Carotid  Result Date: 01/01/2019 Carotid Arterial Duplex Study Indications:  Stroke like symptoms. Risk Factors: Hypertension, hyperlipidemia, Diabetes. Performing Technologist: Abram Sander RVS  Examination Guidelines: A complete evaluation includes B-mode imaging, spectral Doppler, color Doppler, and power Doppler as needed of all accessible portions of each vessel. Bilateral testing is considered an integral part of a complete examination. Limited examinations for reoccurring indications may be  performed as noted.  Right Carotid Findings: +----------+--------+--------+--------+------------------+--------+             PSV cm/s EDV cm/s Stenosis Plaque Description Comments  +----------+--------+--------+--------+------------------+--------+  CCA Prox   60       6                 heterogenous                 +----------+--------+--------+--------+------------------+--------+  CCA Distal 53       7                 heterogenous                 +----------+--------+--------+--------+------------------+--------+  ICA Prox   40       8        1-39%    heterogenous                 +----------+--------+--------+--------+------------------+--------+  ICA Distal 59       14                                             +----------+--------+--------+--------+------------------+--------+  ECA        54                                                      +----------+--------+--------+--------+------------------+--------+ +----------+--------+-------+--------+-------------------+             PSV cm/s EDV cms Describe Arm Pressure (mmHG)  +----------+--------+-------+--------+-------------------+  Subclavian 74                                              +----------+--------+-------+--------+-------------------+ +---------+--------+--+--------+--+---------+  Vertebral PSV cm/s 61 EDV cm/s 14 Antegrade  +---------+--------+--+--------+--+---------+  Left Carotid Findings: +----------+--------+--------+--------+------------------+--------+             PSV cm/s EDV cm/s Stenosis Plaque Description Comments  +----------+--------+--------+--------+------------------+--------+  CCA Prox   83       18                heterogenous                 +----------+--------+--------+--------+------------------+--------+  CCA Distal 45       10                heterogenous                 +----------+--------+--------+--------+------------------+--------+  ICA Prox   47       9        1-39%    heterogenous                 +----------+--------+--------+--------+------------------+--------+  ICA Distal 59       19                                             +----------+--------+--------+--------+------------------+--------+  ECA        46       6                                              +----------+--------+--------+--------+------------------+--------+ +----------+--------+--------+--------+-------------------+  PSV cm/s EDV cm/s Describe Arm Pressure (mmHG)  +----------+--------+--------+--------+-------------------+  Subclavian 63                                              +----------+--------+--------+--------+-------------------+ +---------+--------+--+--------+--+---------+  Vertebral PSV cm/s 49 EDV cm/s 10 Antegrade  +---------+--------+--+--------+--+---------+  Summary: Right Carotid: Velocities in the right ICA are consistent with a 1-39% stenosis. Left Carotid: Velocities in the left ICA are consistent with a 1-39% stenosis. Vertebrals: Bilateral vertebral arteries demonstrate antegrade flow. *See table(s) above for measurements and observations.  Electronically signed by Monica Martinez MD on 01/01/2019 at 6:37:19 PM.    Final      Assessment/Plan  1. Acute metabolic encephalopathy -Now resolved, CVA work-up including CT head, MRI brain, TTE and carotid Doppler without acute finding.  MRA head intracranial atherosclerotic disease with motion degraded.  Follow-up with neurology.   -Has Klebsiella UTI and was started on Ceftriaxone then Keflex  2. Fall, sequela -Has left-sided rib fractures, conservative care per trauma surgery, for PT and OT, continue acetaminophen 500 mg 2 tabs = 1000 mg every 8 hours as needed  3. Anemia, unspecified type -Had hemoglobin 5.2 and was transfused 2 units of blood -EGD with severe ulcerative esophagitis, GE junction stricture (biopsy negative for dysplasia) -Continue pantoprazole 40 mg twice a day, follow-up with GI in 6 to 8 weeks -Continue ferrous sulfate 325 mg 1 tab twice a day  4. Urinary tract infection without hematuria, site unspecified -Was started on ceftriaxone 10/25 through 10/27, then Keflex 10/27 to 10/31  5. Acute urinary retention  will attempt voiding trial in 1 week  6. Type 2 diabetes mellitus with hyperglycemia, with long-term current use of insulin (HCC) Lab Results  Component Value Date   HGBA1C 5.2 12/31/2018  -CBGs 217, 164, 129, 235, 173, 267 - Continue Lantus 100 unit/mL inject 20 units subcutaneously at bedtime  7. Essential hypertension -BPs elevated 172/88, 178/88, 170/90, 180/90, will increase Cozaar from 25mg  to 50 mg daily  8. Persistent atrial fibrillation (HCC) -Now in NSR, will continue Cozaar, Eliquis -We will follow up with cardiology and rediscuss risk and benefits of Eliquis.  She is wheelchair-bound and has falls at home  9. OSA on CPAP -CPAP at bedtime  10. Hypothyroidism, unspecified type Lab Results  Component Value Date   TSH 4.185 12/31/2018  -Continue Synthroid 50 mcg 1 tab daily  11. Left kidney lesion -Incidental finding of an indeterminate lesion in the left mid kidney measuring 40mm is unchanged from CT 1 year  ago  12.  Ulcerative esophagitis -EGD with severe ulcerative esophagitis, GE junction stricture (biopsy negative for dysplasia) - Continue pantoprazole, follow-up with GI   Family/ staff Communication: Discussed plan of care with resident.  Labs/tests ordered: None  Goals of care:   Short-term care   Durenda Age, DNP, FNP-BC [facility] [phone] (Monday-Friday 8:00 a.m. - 5:00 p.m.) [phone] (after hours)

## 2019-01-10 NOTE — Progress Notes (Signed)
01/10/19 New note due to need for charge resubmission.    01/01/19 1400  OT Time Calculation  OT Start Time (ACUTE ONLY) 1434  OT Stop Time (ACUTE ONLY) 1456  OT Time Calculation (min) 22 min  OT General Charges  $OT Visit 1 Visit  OT Evaluation  $OT Eval Moderate Complexity 1 Mod  Nestor Lewandowsky, OTR/L Acute Rehabilitation Services Pager: [phone] Office: [phone]

## 2019-01-11 ENCOUNTER — Non-Acute Institutional Stay (SKILLED_NURSING_FACILITY): Payer: Medicare Other | Admitting: Internal Medicine

## 2019-01-11 ENCOUNTER — Encounter: Payer: Self-pay | Admitting: Internal Medicine

## 2019-01-11 DIAGNOSIS — E1165 Type 2 diabetes mellitus with hyperglycemia: Secondary | ICD-10-CM

## 2019-01-11 DIAGNOSIS — W19XXXA Unspecified fall, initial encounter: Secondary | ICD-10-CM | POA: Insufficient documentation

## 2019-01-11 DIAGNOSIS — G4733 Obstructive sleep apnea (adult) (pediatric): Secondary | ICD-10-CM

## 2019-01-11 DIAGNOSIS — S2249XA Multiple fractures of ribs, unspecified side, initial encounter for closed fracture: Secondary | ICD-10-CM | POA: Diagnosis not present

## 2019-01-11 DIAGNOSIS — Z794 Long term (current) use of insulin: Secondary | ICD-10-CM

## 2019-01-11 DIAGNOSIS — Z9989 Dependence on other enabling machines and devices: Secondary | ICD-10-CM

## 2019-01-11 DIAGNOSIS — I10 Essential (primary) hypertension: Secondary | ICD-10-CM

## 2019-01-11 DIAGNOSIS — D649 Anemia, unspecified: Secondary | ICD-10-CM

## 2019-01-11 DIAGNOSIS — N390 Urinary tract infection, site not specified: Secondary | ICD-10-CM | POA: Insufficient documentation

## 2019-01-11 DIAGNOSIS — K2210 Ulcer of esophagus without bleeding: Secondary | ICD-10-CM | POA: Diagnosis not present

## 2019-01-11 DIAGNOSIS — G9341 Metabolic encephalopathy: Secondary | ICD-10-CM | POA: Insufficient documentation

## 2019-01-11 DIAGNOSIS — N289 Disorder of kidney and ureter, unspecified: Secondary | ICD-10-CM | POA: Insufficient documentation

## 2019-01-11 DIAGNOSIS — I4819 Other persistent atrial fibrillation: Secondary | ICD-10-CM

## 2019-01-11 DIAGNOSIS — R338 Other retention of urine: Secondary | ICD-10-CM | POA: Insufficient documentation

## 2019-01-11 NOTE — Patient Instructions (Signed)
See assessment and plan under each diagnosis in the problem list and acutely for this visit 

## 2019-01-11 NOTE — Assessment & Plan Note (Addendum)
Following discussed with [id number] of gastric acid may be asymptomatic as this may occur mainly during sleep as here apparently.The triggers for reflux  include stress; the "aspirin family" ; alcohol; peppermint; and caffeine (coffee, tea, cola, and chocolate). The aspirin family would include aspirin and the nonsteroidal agents such as ibuprofen &  Naproxen. Tylenol would not cause reflux. If having symptoms ; food & drink should be avoided for @ least 2 hours before going to bed.

## 2019-01-11 NOTE — Assessment & Plan Note (Addendum)
Blood pressure range at the SNF are 118/88-180/90; the majority are over 165/88.  Metoprolol 25 mg twice daily will be initiated.

## 2019-01-11 NOTE — Assessment & Plan Note (Addendum)
01/11/2019 heart sounds are distant but rhythm clinically was essentially regular.

## 2019-01-11 NOTE — Assessment & Plan Note (Addendum)
Patient is refusing CPAP here @ SNF.  She states that she has not used CPAP for years.

## 2019-01-11 NOTE — Progress Notes (Signed)
NURSING HOME LOCATION:  [facility] ROOM NUMBER:  [id number]  CODE STATUS:  Full Code  PCP:  [name], [place] [id number]   This is a comprehensive admission note to [facility] performed on this date less than 30 days from date of admission. Included are preadmission medical/surgical history; reconciled medication list; family history; social history and comprehensive review of systems.  Corrections and additions to the records were documented. Comprehensive physical exam was also performed. Additionally a clinical summary was entered for each active diagnosis pertinent to this admission in the Problem List to enhance continuity of care.  HPI: Patient was hospitalized 10/22-10/30/2020 admitted from home with generalized weakness associated with a fall.  At admission the patient was mildly confused and was noted to be hypoglycemic with a value of 44 as documented by EMS upon arrival at the home.  In ED hemoglobin was 5; FOBT was negative.  EKG revealed rate controlled A. Fib. Imaging revealed left-sided rib fractures with contusion of the left chest wall.   On day 1 she was noted to have slurred speech; stroke work-up was negative except for motion degraded MRI of the head suggesting significant atherosclerotic changes.  Neurology recommended outpatient follow-up once stable. She was transfused for the profound anemia.  Endoscopy revealed severe ulcerative esophagitis prompting initiation of PPI twice daily.  Once cleared by gastroenterology; Eliquis was initiated for the new onset A. fib. Ceftriaxone was prescribed for UTI; this was transitioned to Keflex.  She failed voiding trial prior to discharge and was discharged with a Foley catheter. PT/OT recommended SNF placement for rehab.  Past medical and surgical history: Includes diabetes, renal insufficiency, osteopenia, prior CPAP therapy for OSA, obesity, hypothyroidism, essential  hypertension, dyslipidemia, history of colon polyps, history of former tobacco abuse, history of breast cancer, and history of B12 deficiency. She had bowel resection in her mid to late 42s.  She is also had cholecystectomy.  Bilateral mastectomies were performed in 2001.  Remotely she also had a TAH.  Social history: Nondrinker; smoked for at least 13 years.  Family history: Is noncontributory because of her age.  Both parents did have a heart attack   Review of systems: She is essentially asymptomatic & denies any cardiac or neurologic prodrome prior to the falls.  She stated that she just "loses equilibrium" when moving from bed to the wheelchair.  She does not describe vertigo. Despite the profound anemia and the endoscopic findings of severe esophagitis; she has no GI symptoms.  She also denies any bleeding dyscrasias.  She denies any hypoglycemia prior to hospitalization.  Constitutional: No fever, significant weight change Eyes: No redness, discharge, pain, vision change ENT/mouth: No nasal congestion, purulent discharge, earache, change in hearing, sore throat  Cardiovascular: No chest pain, palpitations, paroxysmal nocturnal dyspnea, claudication, edema  Respiratory: No cough, sputum production, hemoptysis, DOE, significant snoring, apnea Gastrointestinal: No heartburn, dysphagia, abdominal pain, nausea /vomiting, rectal bleeding, melena, change in bowels Genitourinary: No dysuria, hematuria, pyuria, incontinence, nocturia Musculoskeletal: No joint stiffness, joint swelling, weakness, pain Dermatologic: No rash, pruritus, change in appearance of skin Neurologic: No dizziness, headache, syncope, seizures, numbness, tingling Psychiatric: No significant anxiety, depression, insomnia, anorexia Endocrine: No change in hair/skin/nails, excessive thirst, excessive hunger, excessive urination  Hematologic/lymphatic: No significant bruising, lymphadenopathy, abnormal bleeding  Allergy/immunology: No itchy/watery eyes, significant sneezing, urticaria, angioedema  Physical exam:  Pertinent or positive findings: Affect is somewhat flat.  She has bilateral ptosis greater on the right  than the left.  She has intermittent esotropia of the left eye with some superior deviation as well.  Clinically heart sounds were distant but rhythm was regular.  Breath sounds are decreased.Abdomen protuberant. Decreased pedal pulses. Weak to opposition in all extremities.  She has scattered bruising over the forearms.  She has scattered vitiligo over the back.  General appearance: Adequately nourished; no acute distress, increased work of breathing is present.   Lymphatic: No lymphadenopathy about the head, neck, axilla. Eyes: No conjunctival inflammation or lid edema is present. There is no scleral icterus. Ears:  External ear exam shows no significant lesions or deformities.   Nose:  External nasal examination shows no deformity or inflammation. Nasal mucosa are pink and moist without lesions, exudates Oral exam: Lips and gums are healthy appearing.There is no oropharyngeal erythema or exudate. Neck:  No thyromegaly, masses, tenderness noted.    Heart:  No gallop, murmur, click, rub.  Lungs:  without wheezes, rhonchi, rales, rubs. Abdomen: Bowel sounds are normal.  Abdomen is soft and nontender with no organomegaly, hernias, masses. GU: Deferred  Extremities:  No cyanosis, clubbing, edema. Neurologic exam:  Balance, Rhomberg, finger to nose testing could not be completed due to clinical state Skin: Warm & dry w/o tenting. No significant lesions .  See clinical summary under each active problem in the Problem List with associated updated therapeutic plan

## 2019-01-11 NOTE — Assessment & Plan Note (Addendum)
At SNF a.m. glucoses have ranged from 129 up to 217.  Evening glucose is 235-346. NovoLog 5 units will be given before the evening meal if the glucose is over 250.  No change in basal insulin.

## 2019-01-13 NOTE — Assessment & Plan Note (Signed)
PT/OT @ SNF as tolerated. 

## 2019-01-13 NOTE — Assessment & Plan Note (Signed)
Surprisingly no GI symptoms

## 2019-01-13 NOTE — Assessment & Plan Note (Signed)
Urology consult is Foley can not be weaned

## 2019-01-17 ENCOUNTER — Encounter: Payer: Self-pay | Admitting: Adult Health

## 2019-01-17 ENCOUNTER — Other Ambulatory Visit: Payer: Self-pay | Admitting: Cardiovascular Disease

## 2019-01-17 ENCOUNTER — Non-Acute Institutional Stay (SKILLED_NURSING_FACILITY): Payer: Medicare Other | Admitting: Adult Health

## 2019-01-17 DIAGNOSIS — E1165 Type 2 diabetes mellitus with hyperglycemia: Secondary | ICD-10-CM | POA: Diagnosis not present

## 2019-01-17 DIAGNOSIS — N390 Urinary tract infection, site not specified: Secondary | ICD-10-CM | POA: Diagnosis not present

## 2019-01-17 DIAGNOSIS — R338 Other retention of urine: Secondary | ICD-10-CM | POA: Diagnosis not present

## 2019-01-17 DIAGNOSIS — K2210 Ulcer of esophagus without bleeding: Secondary | ICD-10-CM

## 2019-01-17 DIAGNOSIS — Z794 Long term (current) use of insulin: Secondary | ICD-10-CM

## 2019-01-17 DIAGNOSIS — I4819 Other persistent atrial fibrillation: Secondary | ICD-10-CM

## 2019-01-17 DIAGNOSIS — I10 Essential (primary) hypertension: Secondary | ICD-10-CM

## 2019-01-17 NOTE — Progress Notes (Signed)
Location:  [facility] Room Number: 307 A Place of Service:  SNF (31) Provider:  Durenda Age, DNP, FNP-BC  Patient Care Team: Windy Canny as PCP - General (Physician Assistant) Troy Sine, MD as PCP - Cardiology (Cardiology)  Extended Emergency Contact Information Primary Emergency Contact: Callicott,Ann Address: [address], [place] Phone: [phone] Relation: Other  Code Status:  Full Code  Goals of care: Advanced Directive information Advanced Directives 01/10/2019  Does Patient Have a Medical Advance Directive? Yes  Type of Advance Directive (No Data)  Does patient want to make changes to medical advance directive? -  Copy of Springer in Chart? -  Would patient like information on creating a medical advance directive? -  Pre-existing out of facility DNR order (yellow form or pink MOST form) -     Chief Complaint  Patient presents with   Acute Visit    Routine weekly follow up hospitalization    HPI:  Pt is a 85 y.o. female seen today for routine weekly follow up hospitalization. She has PMH of type 2 diabetes mellitus, hypertension, hypothyroidism, breast cancer in remission, OSA on CPAP and anemia. She was seen in her room today. She denies having pain. She has completed antibiotics for UTI. Continues to have foley catheter draining clear yellow urine. CBGs 158, 193, 275, 134, 289, 154, 173. She takes Novolog and Lantus for diabetes mellitus.  She was admitted to [facility] on 01/07/19 post hospitalization 12/30/18 to 01/07/19 for generalized weakness and recurrent falls.  She had hgb 5.2 and was transfused 2 units of blood. Endoscopy was done and was found to have ulcerative esophagitis. She was started on PPI twice a day. EKG revealed rate-controlled atrial fibrillation. She was started on Eliquis after clearance from gastroenterology.  CT head/cervical spine/chest/abdomen/pelvis revealed left-sided rib fractures, rib contusion of left chest wall.  Hospitalization was complicated by acute metabolic encephalopathy and acute urinary retention.   Past Medical History:  Diagnosis Date   Anemia    Anemia of decreased vitamin B12 absorption 05/26/2005   Arthritis    Breast cancer ([place]) 2001   Cataract    History of tobacco abuse    Hx of colonic polyps    Hyperlipidemia    Hypertension    Hypothyroidism    Obesity    OSA on CPAP 09/2007   AHI 10.6/hr overall, 43.64/hr during REM   Osteopenia    Renal insufficiency    Type 2 diabetes mellitus [facility])    Past Surgical History:  Procedure Laterality Date   BACK SURGERY  05/30/2009   BIOPSY  01/01/2019   Procedure: BIOPSY;  Surgeon: Milus Banister, MD;  Location: [facility];  Service: Endoscopy;;   BOWEL RESECTION  1974   CATARACT EXTRACTION Bilateral    bilateral   CHOLECYSTECTOMY  1974   ESOPHAGOGASTRODUODENOSCOPY N/A 01/01/2019   Procedure: ESOPHAGOGASTRODUODENOSCOPY (EGD);  Surgeon: Milus Banister, MD;  Location: [facility] ENDOSCOPY;  Service: Endoscopy;  Laterality: N/A;   MASTECTOMY Bilateral 2001   bilateral sentinel lymph nodes bio   NM MYOCAR PERF WALL MOTION  06/2007   dipyridamole; perfusion defect in inferior myocardium consistent with diaphragmatic attenuation, remaining myocardium with no ischemia/infarct, EF 73%; normal, low risk scan    TOTAL ABDOMINAL HYSTERECTOMY  1975   TRANSTHORACIC ECHOCARDIOGRAM  02/2010   123456, stage 1 diastolic dysfunction; borderline RV enlargement; LA mild-mod dilated;  mild mitral annular calcif & mild MR; mild TR with normal RSVP, AV moderately sclerotics    Allergies  Allergen Reactions   Codeine Sulfate Nausea Only    Outpatient Encounter Medications as of 01/17/2019  Medication Sig   acetaminophen (TYLENOL) 500 MG tablet Take 2 tablets (1,000 mg total) by mouth every 8 (eight) hours as  needed for mild pain, moderate pain or headache.   apixaban (ELIQUIS) 2.5 MG TABS tablet Take 1 tablet (2.5 mg total) by mouth 2 (two) times daily.   Apoaequorin (PREVAGEN PO) Take 1 capsule by mouth daily.   COMBIGAN 0.2-0.5 % ophthalmic solution Place 1 drop into both eyes every 12 (twelve) hours.    feeding supplement, ENSURE ENLIVE, (ENSURE ENLIVE) LIQD Take 237 mLs by mouth 2 (two) times daily between meals.   ferrous sulfate 325 (65 FE) MG tablet Take 1 tablet (325 mg total) by mouth 2 (two) times daily with a meal.   FLUoxetine (PROZAC) 20 MG capsule Take 20 mg by mouth daily.    insulin glargine (LANTUS) 100 UNIT/ML injection Inject 20 Units into the skin at bedtime. Sliding scale   levothyroxine (SYNTHROID, LEVOTHROID) 50 MCG tablet Take 50 mcg by mouth daily.     losartan (COZAAR) 50 MG tablet Take 50 mg by mouth daily.   Multiple Vitamins-Minerals (PRESERVISION AREDS 2) CAPS Take 2 capsules by mouth daily.   pantoprazole (PROTONIX) 40 MG tablet Take 1 tablet (40 mg total) by mouth 2 (two) times daily before a meal.   polyethylene glycol (MIRALAX / GLYCOLAX) 17 g packet Take 17 g by mouth 2 (two) times daily as needed for moderate constipation.   rosuvastatin (CRESTOR) 20 MG tablet Take 20 mg by mouth daily.   senna (SENOKOT) 8.6 MG TABS tablet Take 1 tablet (8.6 mg total) by mouth daily as needed for mild constipation.   No facility-administered encounter medications on file as of 01/17/2019.     Review of Systems  GENERAL: No change in appetite, no fatigue, no weight changes, no fever, chills or weakness MOUTH and THROAT: Denies oral discomfort, gingival pain or bleeding RESPIRATORY: no cough, SOB, DOE, wheezing, hemoptysis CARDIAC: No chest pain, edema or palpitations GI: No abdominal pain, diarrhea, constipation, heart burn, nausea or vomiting NEUROLOGICAL: Denies dizziness, syncope, numbness, or headache PSYCHIATRIC: Denies feelings of depression or anxiety. No  report of hallucinations, insomnia, paranoia, or agitation    Immunization History  Administered Date(s) Administered   Influenza, High Dose Seasonal PF 01/30/2016   Pneumococcal Conjugate-13 07/25/2013   Pneumococcal Polysaccharide-23 08/06/2017   Tdap 10/07/2017   Zoster 08/08/2013   Zoster Recombinat (Shingrix) 10/07/2017   Pertinent  Health Maintenance Due  Topic Date Due   FOOT EXAM  07/21/1943   OPHTHALMOLOGY EXAM  07/21/1943   COLONOSCOPY  07/30/2014   INFLUENZA VACCINE  10/09/2018   HEMOGLOBIN A1C  07/01/2019   DEXA SCAN  Completed   PNA vac Low Risk Adult  Completed   No flowsheet data found.   Vitals:   01/17/19 2233  BP: 122/64  Pulse: 74  Resp: 18  Temp: 97.6 F (36.4 C)  Weight: 192 lb 3.2 oz (87.2 kg)  Height: 5\' 8"  (1.727 m)   Body mass index is 29.22 kg/m.  Physical Exam  GENERAL APPEARANCE: Well nourished. In no acute distress. Normal body habitus SKIN:  Skin is warm and dry.  MOUTH and THROAT: Lips are without lesions. Oral mucosa is moist and without lesions. Tongue is normal in shape, size, and  color and without lesions RESPIRATORY: Breathing is even & unlabored, BS CTAB CARDIAC: RRR, no murmur,no extra heart sounds, no edema GI: Abdomen soft, normal BS, no masses, no tenderness EXTREMITIES:  Able to move X 4 extremities NEUROLOGICAL: There is no tremor. Speech is clear. Alert and oriented X 3. PSYCHIATRIC:  Affect and behavior are appropriate  Labs reviewed: Recent Labs    01/04/19 0330 01/05/19 0403 01/06/19 0329 01/07/19 0305  NA 128* 131* 133*  --   K 4.4 4.9 4.8  --   CL 99 103 104  --   CO2 21* 21* 22  --   GLUCOSE 172* 240* 187*  --   BUN 22 26* 23  --   CREATININE 1.48* 1.58* 1.25*  --   CALCIUM 8.3* 8.1* 8.1*  --   MG 1.8 1.7 1.7 1.7  PHOS  --   --  3.8  --    Recent Labs    01/03/19 0523 01/04/19 0330 01/05/19 0403  AST 19 15 28   ALT 17 16 23   ALKPHOS 53 58 62  BILITOT 0.5 0.7 0.2*  PROT 4.8* 4.9*  4.6*  ALBUMIN 2.1* 2.0* 1.9*   Recent Labs    12/30/18 2246  01/04/19 0330 01/05/19 0403 01/06/19 0329  WBC 6.7   < > 9.4 6.9 9.5  NEUTROABS 5.4  --   --   --   --   HGB 5.2*   < > 7.5* 7.3* 7.3*  HCT 20.0*   < > 26.0* 25.7* 25.5*  MCV 62.9*   < > 72.6* 74.5* 74.8*  PLT 402*   < > 313 301 317   < > = values in this interval not displayed.   Lab Results  Component Value Date   TSH 4.185 12/31/2018   Lab Results  Component Value Date   HGBA1C 5.2 12/31/2018   Lab Results  Component Value Date   CHOL  07/05/2009    115        ATP III CLASSIFICATION:  <200     mg/dL   Desirable  200-239  mg/dL   Borderline High  >=240    mg/dL   High          HDL 42 07/05/2009   LDLCALC  07/05/2009    51        Total Cholesterol/HDL:CHD Risk Coronary Heart Disease Risk Table                     Men   Women  1/2 Average Risk   3.4   3.3  Average Risk       5.0   4.4  2 X Average Risk   9.6   7.1  3 X Average Risk  23.4   11.0        Use the calculated Patient Ratio above and the CHD Risk Table to determine the patient's CHD Risk.        ATP III CLASSIFICATION (LDL):  <100     mg/dL   Optimal  100-129  mg/dL   Near or Above                    Optimal  130-159  mg/dL   Borderline  160-189  mg/dL   High  >190     mg/dL   Very High   TRIG 108 07/05/2009   CHOLHDL 2.7 07/05/2009    Significant Diagnostic Results in last 30 days:  Dg Ribs Unilateral W/chest  Left  Result Date: 12/30/2018 CLINICAL DATA:  Left-sided rib pain fall EXAM: LEFT RIBS AND CHEST - 3+ VIEW COMPARISON:  07/12/2010 FINDINGS: Single-view chest demonstrates no acute airspace disease. There may be small left pleural effusion. Mild cardiomegaly with aortic atherosclerosis. No pneumothorax. Clips in the right axillary region. Left rib series demonstrates old appearing left fifth and 6 rib fractures. Acute mildly displaced left seventh, eighth, ninth and tenth anterolateral rib fractures. IMPRESSION: 1. Acute  displaced left seventh through tenth rib fractures with adjacent small left effusion. 2. Negative for pneumothorax 3. Cardiomegaly Electronically Signed   By: Donavan Foil M.D.   On: 12/30/2018 22:23   Ct Head Wo Contrast  Result Date: 12/31/2018 CLINICAL DATA:  Head trauma, ataxia EXAM: CT HEAD, cervical spine WITHOUT CONTRAST TECHNIQUE: Contiguous axial images were obtained from the base of the skull through the vertex, cervical spine without intravenous contrast. COMPARISON:  December 06, 2017 FINDINGS: Brain: No evidence of acute territorial infarction, hemorrhage, hydrocephalus,extra-axial collection or mass lesion/mass effect. There is dilatation the ventricles and sulci consistent with age-related atrophy. Low-attenuation changes in the deep white matter consistent with small vessel ischemia. Vascular: No hyperdense vessel or unexpected calcification. Skull: The skull is intact. No fracture or focal lesion identified. Sinuses/Orbits: The visualized paranasal sinuses are clear. There is fluid within the right mastoid air cell. The orbits and globes intact. Other: Unchanged small 1 cm in right parotid space nodule again identified. Cervical spine: Alignment: Physiologic Skull base and vertebrae: Visualized skull base is intact. No atlanto-occipital dissociation. The vertebral body heights are well maintained. No fracture or pathologic osseous lesion seen. Soft tissues and spinal canal: The visualized paraspinal soft tissues are unremarkable. No prevertebral soft tissue swelling is seen. The spinal canal is grossly unremarkable, no large epidural collection or significant canal narrowing. Disc levels: Mild disc height loss with disc osteophyte and uncovertebral osteophytes is most notable at C6-C7. Upper chest: Again noted is a calcified nodule within the right thyroid lobe. There is chronic left-sided pleural thickening with a posterior fourth healed rib fracture deformity. Other: None IMPRESSION: No  acute intracranial abnormality. Findings consistent with age related atrophy and chronic small vessel ischemia Fluid within the right mastoid air cell as on prior exam. No acute fracture or malalignment of the spine. Electronically Signed   By: Prudencio Pair M.D.   On: 12/31/2018 00:42   Ct Chest W Contrast  Result Date: 12/31/2018 CLINICAL DATA:  Fall with left-sided pain. Rib fractures on radiograph. EXAM: CT CHEST, ABDOMEN, AND PELVIS WITH CONTRAST TECHNIQUE: Multidetector CT imaging of the chest, abdomen and pelvis was performed following the standard protocol during bolus administration of intravenous contrast. CONTRAST:  67mL OMNIPAQUE IOHEXOL 300 MG/ML  SOLN COMPARISON:  Rib radiographs yesterday. Chest abdomen pelvis CT 12/06/2017 FINDINGS: CT CHEST FINDINGS Cardiovascular: No acute vascular injury. Aortic atherosclerosis and tortuosity. Normal heart size with coronary artery calcifications. Small pericardial effusion. Mediastinum/Nodes: No mediastinal hemorrhage or hematoma. No pneumomediastinum. Moderate hiatal hernia with fluid-filled esophagus. Peripherally calcified right thyroid nodule. No adenopathy. Lungs/Pleura: Small left pleural effusion and adjacent compressive atelectasis. No pneumothorax. Minor dependent atelectasis in the right lower lobe with trace right pleural effusion. Lungs are otherwise clear. Trachea and mainstem bronchi are patent. Musculoskeletal: Motion artifact partially obscures the known left anterior sixth through ninth rib fractures (reported as 7 through tenth rib fractures on radiograph). No radiographically occult rib fracture. No fracture of the sternum or included clavicles and shoulder girdles. No acute fracture of the lumbar  spine. Vertebral body hemangioma within T11. Subcutaneous contusion involving the left lateral chest wall. No subcutaneous emphysema. CT ABDOMEN PELVIS FINDINGS Hepatobiliary: No hepatic injury or perihepatic hematoma. Gallbladder is surgically  absent. Pancreas: No ductal dilatation or inflammation. Parenchymal atrophy without evidence of injury. Spleen: No splenic injury or perisplenic hematoma. Adrenals/Urinary Tract: No adrenal hemorrhage or renal injury identified. Multiple bilateral renal cysts. Absent renal excretion on delayed phase imaging suggest renal dysfunction. Possible solid lesion in the mid left kidney measuring 16 mm, series 3, image 62, not significantly changed from prior. Bladder is unremarkable. Stomach/Bowel: Moderate to large hiatal hernia. No evidence of bowel injury. No mesenteric hematoma. No bowel wall thickening. Distal colonic diverticulosis without diverticulitis. Appendix not confidently visualized. Vascular/Lymphatic: No vascular injury. Aorto bi-iliac atherosclerosis and tortuosity. No retroperitoneal fluid. The IVC is intact. Portal vein is patent. No suspicious adenopathy. Reproductive: Status post hysterectomy. No adnexal masses. Other: No free air or free fluid. Mild patchy contusion in the left flank. Musculoskeletal: Chronic L1 compression fracture with vertebral augmentation. Hemangioma within L3 vertebral body. Multilevel degenerative change in the spine. Bony pelvis is intact. Degenerative change of the pubic symphysis. 1 IMPRESSION: 1. Minimally displaced left anterior sixth through ninth rib fractures. Small left pleural effusion and adjacent compressive atelectasis. No pneumothorax. 2. Subcutaneous contusion of the left lateral chest wall. 3. No additional acute traumatic injury to the chest, abdomen, or pelvis. 4. Moderate to large hiatal hernia with fluid-filled esophagus. Colonic diverticulosis. 5. Absent renal excretion on delayed phase imaging suggests renal dysfunction. Indeterminate lesion in the left mid kidney measuring 16 mm is unchanged from CT 1 year ago. Aortic Atherosclerosis (ICD10-I70.0). Electronically Signed   By: Keith Rake M.D.   On: 12/31/2018 00:37   Ct Cervical Spine Wo  Contrast  Result Date: 12/31/2018 CLINICAL DATA:  Head trauma, ataxia EXAM: CT HEAD, cervical spine WITHOUT CONTRAST TECHNIQUE: Contiguous axial images were obtained from the base of the skull through the vertex, cervical spine without intravenous contrast. COMPARISON:  December 06, 2017 FINDINGS: Brain: No evidence of acute territorial infarction, hemorrhage, hydrocephalus,extra-axial collection or mass lesion/mass effect. There is dilatation the ventricles and sulci consistent with age-related atrophy. Low-attenuation changes in the deep white matter consistent with small vessel ischemia. Vascular: No hyperdense vessel or unexpected calcification. Skull: The skull is intact. No fracture or focal lesion identified. Sinuses/Orbits: The visualized paranasal sinuses are clear. There is fluid within the right mastoid air cell. The orbits and globes intact. Other: Unchanged small 1 cm in right parotid space nodule again identified. Cervical spine: Alignment: Physiologic Skull base and vertebrae: Visualized skull base is intact. No atlanto-occipital dissociation. The vertebral body heights are well maintained. No fracture or pathologic osseous lesion seen. Soft tissues and spinal canal: The visualized paraspinal soft tissues are unremarkable. No prevertebral soft tissue swelling is seen. The spinal canal is grossly unremarkable, no large epidural collection or significant canal narrowing. Disc levels: Mild disc height loss with disc osteophyte and uncovertebral osteophytes is most notable at C6-C7. Upper chest: Again noted is a calcified nodule within the right thyroid lobe. There is chronic left-sided pleural thickening with a posterior fourth healed rib fracture deformity. Other: None IMPRESSION: No acute intracranial abnormality. Findings consistent with age related atrophy and chronic small vessel ischemia Fluid within the right mastoid air cell as on prior exam. No acute fracture or malalignment of the spine.  Electronically Signed   By: Prudencio Pair M.D.   On: 12/31/2018 00:42   Mr Angio Head  Wo Contrast  Result Date: 01/01/2019 CLINICAL DATA:  Focal neuro deficit. Diabetes and hypertension. Breast cancer. Weakness. Recent fall. EXAM: MRI HEAD WITHOUT CONTRAST MRA HEAD WITHOUT CONTRAST TECHNIQUE: Multiplanar, multiecho pulse sequences of the brain and surrounding structures were obtained without intravenous contrast. Angiographic images of the head were obtained using MRA technique without contrast. COMPARISON:  CT head 12/31/2018 FINDINGS: MRI HEAD FINDINGS Brain: Moderate atrophy. Moderate chronic microvascular ischemic changes in the white matter. Negative for acute infarct.  Negative for hemorrhage or mass. Vascular: Normal arterial flow voids with exception of distal right vertebral artery which is not visualized. This could be congenital or acquired. Skull and upper cervical spine: No focal skeletal lesion Sinuses/Orbits: Mild mucosal edema paranasal sinuses. Large right mastoid effusion. Small left mastoid effusion. Bilateral cataract surgery. Other: Motion degraded study MRA HEAD FINDINGS Image quality degraded by extensive motion. Limited information is available on the study. Left vertebral artery supplies the basilar. Right vertebral artery not visualized. Basilar is patent. Posterior cerebral arteries are patent but irregular and likely contain atherosclerotic disease Extensive irregularity in the internal carotid artery bilaterally in the precavernous and cavernous segment. Extensive irregularity in the anterior middle cerebral arteries. Suspect underlying atherosclerotic disease however there is considerable artifact. IMPRESSION: 1. No acute infarct. Atrophy and moderate chronic microvascular ischemia. 2. MRA is degraded by motion with little diagnostic information. Suspect underlying intracranial atherosclerotic disease which could be severe. Electronically Signed   By: Franchot Gallo M.D.   On:  01/01/2019 13:57   Mr Brain Wo Contrast  Result Date: 01/01/2019 CLINICAL DATA:  Focal neuro deficit. Diabetes and hypertension. Breast cancer. Weakness. Recent fall. EXAM: MRI HEAD WITHOUT CONTRAST MRA HEAD WITHOUT CONTRAST TECHNIQUE: Multiplanar, multiecho pulse sequences of the brain and surrounding structures were obtained without intravenous contrast. Angiographic images of the head were obtained using MRA technique without contrast. COMPARISON:  CT head 12/31/2018 FINDINGS: MRI HEAD FINDINGS Brain: Moderate atrophy. Moderate chronic microvascular ischemic changes in the white matter. Negative for acute infarct.  Negative for hemorrhage or mass. Vascular: Normal arterial flow voids with exception of distal right vertebral artery which is not visualized. This could be congenital or acquired. Skull and upper cervical spine: No focal skeletal lesion Sinuses/Orbits: Mild mucosal edema paranasal sinuses. Large right mastoid effusion. Small left mastoid effusion. Bilateral cataract surgery. Other: Motion degraded study MRA HEAD FINDINGS Image quality degraded by extensive motion. Limited information is available on the study. Left vertebral artery supplies the basilar. Right vertebral artery not visualized. Basilar is patent. Posterior cerebral arteries are patent but irregular and likely contain atherosclerotic disease Extensive irregularity in the internal carotid artery bilaterally in the precavernous and cavernous segment. Extensive irregularity in the anterior middle cerebral arteries. Suspect underlying atherosclerotic disease however there is considerable artifact. IMPRESSION: 1. No acute infarct. Atrophy and moderate chronic microvascular ischemia. 2. MRA is degraded by motion with little diagnostic information. Suspect underlying intracranial atherosclerotic disease which could be severe. Electronically Signed   By: Franchot Gallo M.D.   On: 01/01/2019 13:57   Ct Abdomen Pelvis W Contrast  Result  Date: 12/31/2018 CLINICAL DATA:  Fall with left-sided pain. Rib fractures on radiograph. EXAM: CT CHEST, ABDOMEN, AND PELVIS WITH CONTRAST TECHNIQUE: Multidetector CT imaging of the chest, abdomen and pelvis was performed following the standard protocol during bolus administration of intravenous contrast. CONTRAST:  12mL OMNIPAQUE IOHEXOL 300 MG/ML  SOLN COMPARISON:  Rib radiographs yesterday. Chest abdomen pelvis CT 12/06/2017 FINDINGS: CT CHEST FINDINGS Cardiovascular: No acute vascular injury.  Aortic atherosclerosis and tortuosity. Normal heart size with coronary artery calcifications. Small pericardial effusion. Mediastinum/Nodes: No mediastinal hemorrhage or hematoma. No pneumomediastinum. Moderate hiatal hernia with fluid-filled esophagus. Peripherally calcified right thyroid nodule. No adenopathy. Lungs/Pleura: Small left pleural effusion and adjacent compressive atelectasis. No pneumothorax. Minor dependent atelectasis in the right lower lobe with trace right pleural effusion. Lungs are otherwise clear. Trachea and mainstem bronchi are patent. Musculoskeletal: Motion artifact partially obscures the known left anterior sixth through ninth rib fractures (reported as 7 through tenth rib fractures on radiograph). No radiographically occult rib fracture. No fracture of the sternum or included clavicles and shoulder girdles. No acute fracture of the lumbar spine. Vertebral body hemangioma within T11. Subcutaneous contusion involving the left lateral chest wall. No subcutaneous emphysema. CT ABDOMEN PELVIS FINDINGS Hepatobiliary: No hepatic injury or perihepatic hematoma. Gallbladder is surgically absent. Pancreas: No ductal dilatation or inflammation. Parenchymal atrophy without evidence of injury. Spleen: No splenic injury or perisplenic hematoma. Adrenals/Urinary Tract: No adrenal hemorrhage or renal injury identified. Multiple bilateral renal cysts. Absent renal excretion on delayed phase imaging suggest renal  dysfunction. Possible solid lesion in the mid left kidney measuring 16 mm, series 3, image 62, not significantly changed from prior. Bladder is unremarkable. Stomach/Bowel: Moderate to large hiatal hernia. No evidence of bowel injury. No mesenteric hematoma. No bowel wall thickening. Distal colonic diverticulosis without diverticulitis. Appendix not confidently visualized. Vascular/Lymphatic: No vascular injury. Aorto bi-iliac atherosclerosis and tortuosity. No retroperitoneal fluid. The IVC is intact. Portal vein is patent. No suspicious adenopathy. Reproductive: Status post hysterectomy. No adnexal masses. Other: No free air or free fluid. Mild patchy contusion in the left flank. Musculoskeletal: Chronic L1 compression fracture with vertebral augmentation. Hemangioma within L3 vertebral body. Multilevel degenerative change in the spine. Bony pelvis is intact. Degenerative change of the pubic symphysis. 1 IMPRESSION: 1. Minimally displaced left anterior sixth through ninth rib fractures. Small left pleural effusion and adjacent compressive atelectasis. No pneumothorax. 2. Subcutaneous contusion of the left lateral chest wall. 3. No additional acute traumatic injury to the chest, abdomen, or pelvis. 4. Moderate to large hiatal hernia with fluid-filled esophagus. Colonic diverticulosis. 5. Absent renal excretion on delayed phase imaging suggests renal dysfunction. Indeterminate lesion in the left mid kidney measuring 16 mm is unchanged from CT 1 year ago. Aortic Atherosclerosis (ICD10-I70.0). Electronically Signed   By: Keith Rake M.D.   On: 12/31/2018 00:37   Vas US Carotid  Result Date: 01/01/2019 Carotid Arterial Duplex Study Indications:  Stroke like symptoms. Risk Factors: Hypertension, hyperlipidemia, Diabetes. Performing Technologist: Abram Sander RVS  Examination Guidelines: A complete evaluation includes B-mode imaging, spectral Doppler, color Doppler, and power Doppler as needed of all accessible  portions of each vessel. Bilateral testing is considered an integral part of a complete examination. Limited examinations for reoccurring indications may be performed as noted.  Right Carotid Findings: +----------+--------+--------+--------+------------------+--------+             PSV cm/s EDV cm/s Stenosis Plaque Description Comments  +----------+--------+--------+--------+------------------+--------+  CCA Prox   60       6                 heterogenous                 +----------+--------+--------+--------+------------------+--------+  CCA Distal 53       7                 heterogenous                 +----------+--------+--------+--------+------------------+--------+  ICA Prox   40       8        1-39%    heterogenous                 +----------+--------+--------+--------+------------------+--------+  ICA Distal 59       14                                             +----------+--------+--------+--------+------------------+--------+  ECA        54                                                      +----------+--------+--------+--------+------------------+--------+ +----------+--------+-------+--------+-------------------+             PSV cm/s EDV cms Describe Arm Pressure (mmHG)  +----------+--------+-------+--------+-------------------+  Subclavian 74                                             +----------+--------+-------+--------+-------------------+ +---------+--------+--+--------+--+---------+  Vertebral PSV cm/s 61 EDV cm/s 14 Antegrade  +---------+--------+--+--------+--+---------+  Left Carotid Findings: +----------+--------+--------+--------+------------------+--------+             PSV cm/s EDV cm/s Stenosis Plaque Description Comments  +----------+--------+--------+--------+------------------+--------+  CCA Prox   83       18                heterogenous                 +----------+--------+--------+--------+------------------+--------+  CCA Distal 45       10                heterogenous                  +----------+--------+--------+--------+------------------+--------+  ICA Prox   47       9        1-39%    heterogenous                 +----------+--------+--------+--------+------------------+--------+  ICA Distal 59       19                                             +----------+--------+--------+--------+------------------+--------+  ECA        46       6                                              +----------+--------+--------+--------+------------------+--------+ +----------+--------+--------+--------+-------------------+             PSV cm/s EDV cm/s Describe Arm Pressure (mmHG)  +----------+--------+--------+--------+-------------------+  Subclavian 63                                              +----------+--------+--------+--------+-------------------+ +---------+--------+--+--------+--+---------+  Vertebral PSV cm/s  49 EDV cm/s 10 Antegrade  +---------+--------+--+--------+--+---------+  Summary: Right Carotid: Velocities in the right ICA are consistent with a 1-39% stenosis. Left Carotid: Velocities in the left ICA are consistent with a 1-39% stenosis. Vertebrals: Bilateral vertebral arteries demonstrate antegrade flow. *See table(s) above for measurements and observations.  Electronically signed by Monica Martinez MD on 01/01/2019 at 6:37:19 PM.    Final     Assessment/Plan  1. Urinary tract infection without hematuria, site unspecified - completed antibiotics, resolved  2. Type 2 diabetes mellitus with hyperglycemia, with long-term current use of insulin (HCC) Lab Results  Component Value Date   HGBA1C 5.2 12/31/2018  - continue Novolog and Lantus  3. Ulcerative esophagitis - continue Pantoprazole, follow-up with GI  4. Acute urinary retention - will do voiding trial  5. Essential hypertension - BPs stable, continue Losartan and Metoprolol  6. Persistent atrial fibrillation ([place]) - rate-controlled, continue Eliquis    Family/ staff Communication:  Discussed plan of  care with resident.  Labs/tests ordered:  None  Goals of care:   Short-term care   Durenda Age, DNP, FNP-BC [facility] [phone] (Monday-Friday 8:00 a.m. - 5:00 p.m.) [phone] (after hours)

## 2019-01-18 ENCOUNTER — Encounter: Payer: Self-pay | Admitting: Adult Health

## 2019-01-21 ENCOUNTER — Encounter: Payer: Self-pay | Admitting: Adult Health

## 2019-01-21 ENCOUNTER — Non-Acute Institutional Stay (SKILLED_NURSING_FACILITY): Payer: Medicare Other | Admitting: Adult Health

## 2019-01-21 DIAGNOSIS — E039 Hypothyroidism, unspecified: Secondary | ICD-10-CM

## 2019-01-21 DIAGNOSIS — F339 Major depressive disorder, recurrent, unspecified: Secondary | ICD-10-CM

## 2019-01-21 DIAGNOSIS — I4819 Other persistent atrial fibrillation: Secondary | ICD-10-CM | POA: Diagnosis not present

## 2019-01-21 DIAGNOSIS — D649 Anemia, unspecified: Secondary | ICD-10-CM

## 2019-01-21 DIAGNOSIS — K2210 Ulcer of esophagus without bleeding: Secondary | ICD-10-CM

## 2019-01-21 DIAGNOSIS — E785 Hyperlipidemia, unspecified: Secondary | ICD-10-CM

## 2019-01-21 DIAGNOSIS — S2249XA Multiple fractures of ribs, unspecified side, initial encounter for closed fracture: Secondary | ICD-10-CM

## 2019-01-21 DIAGNOSIS — E1165 Type 2 diabetes mellitus with hyperglycemia: Secondary | ICD-10-CM | POA: Diagnosis not present

## 2019-01-21 DIAGNOSIS — E1169 Type 2 diabetes mellitus with other specified complication: Secondary | ICD-10-CM | POA: Insufficient documentation

## 2019-01-21 DIAGNOSIS — F32A Depression, unspecified: Secondary | ICD-10-CM | POA: Insufficient documentation

## 2019-01-21 DIAGNOSIS — I10 Essential (primary) hypertension: Secondary | ICD-10-CM

## 2019-01-21 DIAGNOSIS — Z794 Long term (current) use of insulin: Secondary | ICD-10-CM

## 2019-01-21 MED ORDER — LOSARTAN POTASSIUM 50 MG PO TABS: 50.0000 mg | ORAL_TABLET | Freq: Every day | ORAL | 0 refills | Status: DC

## 2019-01-21 MED ORDER — FLUOXETINE HCL 20 MG PO CAPS: 20.0000 mg | ORAL_CAPSULE | Freq: Every day | ORAL | 0 refills | Status: DC

## 2019-01-21 MED ORDER — ROSUVASTATIN CALCIUM 20 MG PO TABS: 20.0000 mg | ORAL_TABLET | Freq: Every day | ORAL | 0 refills | Status: DC

## 2019-01-21 MED ORDER — APIXABAN 2.5 MG PO TABS: 2.5000 mg | ORAL_TABLET | Freq: Two times a day (BID) | ORAL | 0 refills | Status: DC

## 2019-01-21 MED ORDER — PANTOPRAZOLE SODIUM 40 MG PO TBEC: 40.0000 mg | DELAYED_RELEASE_TABLET | Freq: Two times a day (BID) | ORAL | 0 refills | Status: DC

## 2019-01-21 MED ORDER — INSULIN ASPART 100 UNIT/ML ~~LOC~~ SOLN: 5.0000 [IU] | SUBCUTANEOUS | 0 refills | Status: DC | PRN

## 2019-01-21 MED ORDER — METOPROLOL TARTRATE 25 MG PO TABS: 12.5000 mg | ORAL_TABLET | Freq: Two times a day (BID) | ORAL | 0 refills | Status: DC

## 2019-01-21 MED ORDER — INSULIN GLARGINE 100 UNIT/ML ~~LOC~~ SOLN: 20.0000 [IU] | Freq: Every day | SUBCUTANEOUS | 0 refills | Status: DC

## 2019-01-21 MED ORDER — COMBIGAN 0.2-0.5 % OP SOLN: 1.0000 [drp] | Freq: Two times a day (BID) | OPHTHALMIC | 0 refills | Status: DC

## 2019-01-21 MED ORDER — LEVOTHYROXINE SODIUM 50 MCG PO TABS: 50.0000 ug | ORAL_TABLET | Freq: Every day | ORAL | 0 refills | Status: DC

## 2019-01-21 NOTE — Progress Notes (Addendum)
Location:  [place] Room Number: [id number] Place of Service:  SNF (31) Provider:  Durenda Age, DNP, FNP-BC  Patient Care Team: Windy Canny as PCP - General (Physician Assistant) Troy Sine, MD as PCP - Cardiology (Cardiology)  Extended Emergency Contact Information Primary Emergency Contact: Callicott,Ann Address: [address], [place] Phone: [phone] Relation: Other  Code Status:  Full Code  Goals of care: Advanced Directive information Advanced Directives 01/18/2019  Does Patient Have a Medical Advance Directive? Yes  Type of Advance Directive (No Data)  Does patient want to make changes to medical advance directive? No - Patient declined  Copy of [place] in Chart? -  Would patient like information on creating a medical advance directive? -  Pre-existing out of facility DNR order (yellow form or pink MOST form) -     Chief Complaint  Patient presents with  . Acute Visit    Supposedly for discharge    HPI:  Addendum:  She was supposedly for discharge but was canceled. She continued with her rehabilitation.   She was admitted to [place] on 01/07/19 post hospitalization on 12/30/2018 to 01/07/2019. She has a PMH of breast cancer, HLD, HTN, hypothyroidism, OSA on CPAP, osteopenia, DM2, renal insufficiency and cataracts.  She presented to the hospital with generalized weakness and recurrent falls.  She was mildly confused and hypoglycemic to 46 when EMS arrived.  In the ED, hemoglobin was 5.2 and was transfused.  She had severe ulcerative esophagitis on endoscopy and was started on PPI.  FOBT negative and EKG reaveled controlled atrial fibrillation.  She was restarted on Eliquis after clearance with gastroenterology.  CT head/cervical spine/chest/abdomen/pelvis revealed left-sided rib fractures with rib contusion of left chest wall.  Trauma  was consulted.  She was noted to have slurred speech and worked up for stroke.  MRA head concerning for significant atherosclerotic changes.  She was found to have a UTI and was started on ceftriaxone then Keflex.  Hospitalization was complicated by acute metabolic encephalopathy and acute urinary retention she was discharged on Foley catheter.   Foley catheter has been discontinued now.  She denies having urinary retention.    Past Medical History:  Diagnosis Date  . Anemia   . Anemia of decreased vitamin B12 absorption 05/26/2005  . Arthritis   . Breast cancer ([place]) 2001  . Cataract   . History of tobacco abuse   . Hx of colonic polyps   . Hyperlipidemia   . Hypertension   . Hypothyroidism   . Obesity   . OSA on CPAP 09/2007   AHI 10.6/hr overall, 43.64/hr during REM  . Osteopenia   . Renal insufficiency   . Type 2 diabetes mellitus ([place])    Past Surgical History:  Procedure Laterality Date  . BACK SURGERY  05/30/2009  . BIOPSY  01/01/2019   Procedure: BIOPSY;  Surgeon: Milus Banister, MD;  Location: [facility] ENDOSCOPY;  Service: Endoscopy;;  . BOWEL RESECTION  1974  . CATARACT EXTRACTION Bilateral    bilateral  . CHOLECYSTECTOMY  1974  . ESOPHAGOGASTRODUODENOSCOPY N/A 01/01/2019   Procedure: ESOPHAGOGASTRODUODENOSCOPY (EGD);  Surgeon: Milus Banister, MD;  Location: [facility] ENDOSCOPY;  Service: Endoscopy;  Laterality: N/A;  . MASTECTOMY Bilateral 2001   bilateral sentinel lymph nodes bio  . NM MYOCAR PERF WALL MOTION  06/2007   dipyridamole; perfusion defect in inferior myocardium consistent with  diaphragmatic attenuation, remaining myocardium with no ischemia/infarct, EF 73%; normal, low risk scan   . TOTAL ABDOMINAL HYSTERECTOMY  1975  . TRANSTHORACIC ECHOCARDIOGRAM  02/2010   123456, stage 1 diastolic dysfunction; borderline RV enlargement; LA mild-mod dilated; mild mitral annular calcif & mild MR; mild TR with normal RSVP, AV moderately sclerotics    Allergies  Allergen  Reactions  . Codeine Sulfate Nausea Only    Outpatient Encounter Medications as of 01/21/2019  Medication Sig  . acetaminophen (TYLENOL) 500 MG tablet Take 2 tablets (1,000 mg total) by mouth every 8 (eight) hours as needed for mild pain, moderate pain or headache.  Marland Kitchen apixaban (ELIQUIS) 2.5 MG TABS tablet Take 1 tablet (2.5 mg total) by mouth 2 (two) times daily.  Marland Kitchen Apoaequorin (PREVAGEN PO) Take 1 capsule by mouth daily.  . brimonidine-timolol (COMBIGAN) 0.2-0.5 % ophthalmic solution Place 1 drop into both eyes every 12 (twelve) hours.  . feeding supplement, ENSURE ENLIVE, (ENSURE ENLIVE) LIQD Take 237 mLs by mouth 2 (two) times daily between meals.  . ferrous sulfate 325 (65 FE) MG tablet Take 1 tablet (325 mg total) by mouth 2 (two) times daily with a meal.  . FLUoxetine (PROZAC) 20 MG capsule Take 1 capsule (20 mg total) by mouth daily.  . insulin aspart (NOVOLOG) 100 UNIT/ML injection Inject 5 Units into the skin as needed for high blood sugar. Give 5units AC evening if glucose>250  . insulin glargine (LANTUS) 100 UNIT/ML injection Inject 0.2 mLs (20 Units total) into the skin at bedtime. Sliding scale  . levothyroxine (SYNTHROID) 50 MCG tablet Take 1 tablet (50 mcg total) by mouth daily.  Marland Kitchen losartan (COZAAR) 50 MG tablet Take 1 tablet (50 mg total) by mouth daily.  . metoprolol tartrate (LOPRESSOR) 25 MG tablet Take 0.5 tablets (12.5 mg total) by mouth 2 (two) times daily.  . Multiple Vitamins-Minerals (PRESERVISION AREDS 2) CAPS Take 2 capsules by mouth daily.  . pantoprazole (PROTONIX) 40 MG tablet Take 1 tablet (40 mg total) by mouth 2 (two) times daily before a meal.  . polyethylene glycol (MIRALAX / GLYCOLAX) 17 g packet Take 17 g by mouth 2 (two) times daily as needed for moderate constipation.  . rosuvastatin (CRESTOR) 20 MG tablet Take 1 tablet (20 mg total) by mouth daily.  Marland Kitchen senna (SENOKOT) 8.6 MG TABS tablet Take 1 tablet (8.6 mg total) by mouth daily as needed for mild  constipation.  . [DISCONTINUED] apixaban (ELIQUIS) 2.5 MG TABS tablet Take 1 tablet (2.5 mg total) by mouth 2 (two) times daily.  . [DISCONTINUED] COMBIGAN 0.2-0.5 % ophthalmic solution Place 1 drop into both eyes every 12 (twelve) hours.   . [DISCONTINUED] FLUoxetine (PROZAC) 20 MG capsule Take 20 mg by mouth daily.   . [DISCONTINUED] insulin aspart (NOVOLOG) 100 UNIT/ML injection Give 5units AC evening if glucose>250  . [DISCONTINUED] insulin glargine (LANTUS) 100 UNIT/ML injection Inject 20 Units into the skin at bedtime. Sliding scale  . [DISCONTINUED] levothyroxine (SYNTHROID, LEVOTHROID) 50 MCG tablet Take 50 mcg by mouth daily.    . [DISCONTINUED] losartan (COZAAR) 50 MG tablet Take 50 mg by mouth daily.  . [DISCONTINUED] metoprolol tartrate (LOPRESSOR) 25 MG tablet Take 12.5 mg by mouth 2 (two) times daily.   . [DISCONTINUED] pantoprazole (PROTONIX) 40 MG tablet Take 1 tablet (40 mg total) by mouth 2 (two) times daily before a meal.  . [DISCONTINUED] rosuvastatin (CRESTOR) 20 MG tablet Take 20 mg by mouth daily.   No facility-administered encounter medications on file  as of 01/21/2019.     Review of Systems  GENERAL: No change in appetite, no fatigue, no weight changes, no fever or chills  MOUTH and THROAT: Denies oral discomfort, gingival pain or bleeding, pain from teeth or hoarseness   RESPIRATORY: no cough, SOB, DOE, wheezing, hemoptysis CARDIAC: No chest pain, edema or palpitations GI: No abdominal pain, diarrhea, constipation, heart burn, nausea or vomiting GU: Denies dysuria, frequency, hematuria, incontinence, or discharge NEUROLOGICAL: Denies dizziness, syncope, numbness, or headache PSYCHIATRIC: Denies feelings of depression or anxiety. No report of hallucinations, insomnia, paranoia, or agitation   Immunization History  Administered Date(s) Administered  . Influenza, High Dose Seasonal PF 01/30/2016  . Influenza-Unspecified 01/11/2019  . Pneumococcal Conjugate-13  07/25/2013  . Pneumococcal Polysaccharide-23 08/06/2017  . Tdap 10/07/2017  . Zoster 08/08/2013  . Zoster Recombinat (Shingrix) 10/07/2017   Pertinent  Health Maintenance Due  Topic Date Due  . FOOT EXAM  07/21/1943  . OPHTHALMOLOGY EXAM  07/21/1943  . COLONOSCOPY  07/30/2014  . HEMOGLOBIN A1C  07/01/2019  . INFLUENZA VACCINE  Completed  . DEXA SCAN  Completed  . PNA vac Low Risk Adult  Completed    Vitals:   01/21/19 1418  BP: 140/68  Pulse: 87  Resp: 20  Temp: 97.7 F (36.5 C)  TempSrc: Oral  SpO2: 96%  Weight: 192 lb 3.2 oz (87.2 kg)  Height: 5\' 8"  (1.727 m)   Body mass index is 29.22 kg/m.  Physical Exam  GENERAL APPEARANCE: Well nourished. In no acute distress. Normal body habitus SKIN:  Skin is warm and dry.  MOUTH and THROAT: Lips are without lesions. Oral mucosa is moist and without lesions. Tongue is normal in shape, size, and color and without lesions RESPIRATORY: Breathing is even & unlabored, BS CTAB CARDIAC: RRR, no murmur,no extra heart sounds, BLE 1+ edema GI: Abdomen soft, normal BS, no masses, no tenderness EXTREMITIES:  Able to move X 4 extremities NEUROLOGICAL: There is no tremor. Speech is clear. Alert and oriented X 3. PSYCHIATRIC:  Affect and behavior are appropriate  Labs reviewed: Recent Labs    01/04/19 0330 01/05/19 0403 01/06/19 0329 01/07/19 0305  NA 128* 131* 133*  --   K 4.4 4.9 4.8  --   CL 99 103 104  --   CO2 21* 21* 22  --   GLUCOSE 172* 240* 187*  --   BUN 22 26* 23  --   CREATININE 1.48* 1.58* 1.25*  --   CALCIUM 8.3* 8.1* 8.1*  --   MG 1.8 1.7 1.7 1.7  PHOS  --   --  3.8  --    Recent Labs    01/03/19 0523 01/04/19 0330 01/05/19 0403  AST 19 15 28   ALT 17 16 23   ALKPHOS 53 58 62  BILITOT 0.5 0.7 0.2*  PROT 4.8* 4.9* 4.6*  ALBUMIN 2.1* 2.0* 1.9*   Recent Labs    12/30/18 2246  01/04/19 0330 01/05/19 0403 01/06/19 0329  WBC 6.7   < > 9.4 6.9 9.5  NEUTROABS 5.4  --   --   --   --   HGB 5.2*   < > 7.5*  7.3* 7.3*  HCT 20.0*   < > 26.0* 25.7* 25.5*  MCV 62.9*   < > 72.6* 74.5* 74.8*  PLT 402*   < > 313 301 317   < > = values in this interval not displayed.   Lab Results  Component Value Date   TSH 4.185 12/31/2018  Lab Results  Component Value Date   HGBA1C 5.2 12/31/2018   Lab Results  Component Value Date   CHOL  07/05/2009    115        ATP III CLASSIFICATION:  <200     mg/dL   Desirable  200-239  mg/dL   Borderline High  >=240    mg/dL   High          HDL 42 07/05/2009   LDLCALC  07/05/2009    51        Total Cholesterol/HDL:CHD Risk Coronary Heart Disease Risk Table                     Men   Women  1/2 Average Risk   3.4   3.3  Average Risk       5.0   4.4  2 X Average Risk   9.6   7.1  3 X Average Risk  23.4   11.0        Use the calculated Patient Ratio above and the CHD Risk Table to determine the patient's CHD Risk.        ATP III CLASSIFICATION (LDL):  <100     mg/dL   Optimal  100-129  mg/dL   Near or Above                    Optimal  130-159  mg/dL   Borderline  160-189  mg/dL   High  >190     mg/dL   Very High   TRIG 108 07/05/2009   CHOLHDL 2.7 07/05/2009     Assessment/Plan  1. Symptomatic anemia Lab Results  Component Value Date   HGB 7.3 (L) 01/06/2019  -S/P transfusion of 2 units of blood, EGD with severe ulcerative esophagitis, GE junction stricture (biopsy negative for dysplasia), continue ferrous sulfate 325 mg 1 tab twice a day  2. Type 2 diabetes mellitus with hyperglycemia, with long-term current use of insulin (HCC) Lab Results  Component Value Date   HGBA1C 5.2 12/31/2018   - insulin aspart (NOVOLOG) 100 UNIT/ML injection; Inject 5 Units into the skin as needed for high blood sugar. Give 5units AC evening if glucose>250  Dispense: 10 mL; Refill: 0 - insulin glargine (LANTUS) 100 UNIT/ML injection; Inject 0.2 mLs (20 Units total) into the skin at bedtime. Sliding scale  Dispense: 10 mL; Refill: 0  3. Persistent atrial  fibrillation (HCC) - apixaban (ELIQUIS) 2.5 MG TABS tablet; Take 1 tablet (2.5 mg total) by mouth 2 (two) times daily.  Dispense: 60 tablet; Refill: 0 - metoprolol tartrate (LOPRESSOR) 25 MG tablet; Take 0.5 tablets (12.5 mg total) by mouth 2 (two) times daily.  Dispense: 30 tablet; Refill: 0 -Follow-up with cardiology and rediscuss risk and benefits of Eliquis.  She is wheelchair-bound and has falls at home  4. Ulcerative esophagitis -EGD with severe ulcerative esophagitis, GE junction stricture (biopsy negative for dysplasia), follow-up with GI  - pantoprazole (PROTONIX) 40 MG tablet; Take 1 tablet (40 mg total) by mouth 2 (two) times daily before a meal.  Dispense: 60 tablet; Refill: 0  5. Hypothyroidism, unspecified type Lab Results  Component Value Date   TSH 4.185 12/31/2018   - levothyroxine (SYNTHROID) 50 MCG tablet; Take 1 tablet (50 mcg total) by mouth daily.  Dispense: 30 tablet; Refill: 0  6. Depression, recurrent (HCC) - FLUoxetine (PROZAC) 20 MG capsule; Take 1 capsule (20 mg total) by mouth daily.  Dispense: 30 capsule;  Refill: 0  7. Essential hypertension -Losartan was recently increased from 50 mg daily to twice a day - losartan (COZAAR) 50 MG tablet; Take 1 tablet (50 mg total) by mouth daily.  Dispense: 30 tablet; Refill: 0 - metoprolol tartrate (LOPRESSOR) 25 MG tablet; Take 0.5 tablets (12.5 mg total) by mouth 2 (two) times daily.  Dispense: 30 tablet; Refill: 0  8. Closed fracture of multiple ribs, unspecified laterality, initial encounter - S/P fall at home, has left-sided rib fractures, continue acetaminophen 500 mg 2 tabs = 1000 mg every 8 hours as needed for pain -We will have home health PT and OT, for therapeutic strengthening exercises  9. Hyperlipidemia associated with type 2 diabetes mellitus ([place]) Lab Results  Component Value Date   CHOL  07/05/2009    115        ATP III CLASSIFICATION:  <200     mg/dL   Desirable  200-239  mg/dL   Borderline High   >=240    mg/dL   High          HDL 42 07/05/2009   LDLCALC  07/05/2009    51        Total Cholesterol/HDL:CHD Risk Coronary Heart Disease Risk Table                     Men   Women  1/2 Average Risk   3.4   3.3  Average Risk       5.0   4.4  2 X Average Risk   9.6   7.1  3 X Average Risk  23.4   11.0        Use the calculated Patient Ratio above and the CHD Risk Table to determine the patient's CHD Risk.        ATP III CLASSIFICATION (LDL):  <100     mg/dL   Optimal  100-129  mg/dL   Near or Above                    Optimal  130-159  mg/dL   Borderline  160-189  mg/dL   High  >190     mg/dL   Very High   TRIG 108 07/05/2009   CHOLHDL 2.7 07/05/2009   - rosuvastatin (CRESTOR) 20 MG tablet; Take 1 tablet (20 mg total) by mouth daily.  Dispense: 30 tablet; Refill: 0   Discussed plan of care with resident and charge nurse.  No labs ordered.  Short-term care.    Durenda Age, DNP, FNP-BC [facility] [phone] (Monday-Friday 8:00 a.m. - 5:00 p.m.) [phone] (after hours)

## 2019-01-24 ENCOUNTER — Other Ambulatory Visit: Payer: Self-pay | Admitting: Cardiovascular Disease

## 2019-01-28 ENCOUNTER — Ambulatory Visit: Payer: Medicare Other | Admitting: Physician Assistant

## 2019-02-16 ENCOUNTER — Non-Acute Institutional Stay (SKILLED_NURSING_FACILITY): Payer: Medicare Other | Admitting: Adult Health

## 2019-02-16 ENCOUNTER — Encounter: Payer: Self-pay | Admitting: Adult Health

## 2019-02-16 DIAGNOSIS — E785 Hyperlipidemia, unspecified: Secondary | ICD-10-CM

## 2019-02-16 DIAGNOSIS — D649 Anemia, unspecified: Secondary | ICD-10-CM | POA: Diagnosis not present

## 2019-02-16 DIAGNOSIS — I4819 Other persistent atrial fibrillation: Secondary | ICD-10-CM | POA: Diagnosis not present

## 2019-02-16 DIAGNOSIS — E1169 Type 2 diabetes mellitus with other specified complication: Secondary | ICD-10-CM

## 2019-02-16 DIAGNOSIS — E1165 Type 2 diabetes mellitus with hyperglycemia: Secondary | ICD-10-CM

## 2019-02-16 DIAGNOSIS — E039 Hypothyroidism, unspecified: Secondary | ICD-10-CM

## 2019-02-16 DIAGNOSIS — I10 Essential (primary) hypertension: Secondary | ICD-10-CM | POA: Diagnosis not present

## 2019-02-16 DIAGNOSIS — F339 Major depressive disorder, recurrent, unspecified: Secondary | ICD-10-CM

## 2019-02-16 DIAGNOSIS — K2210 Ulcer of esophagus without bleeding: Secondary | ICD-10-CM

## 2019-02-16 DIAGNOSIS — S2242XS Multiple fractures of ribs, left side, sequela: Secondary | ICD-10-CM

## 2019-02-16 DIAGNOSIS — Z794 Long term (current) use of insulin: Secondary | ICD-10-CM

## 2019-02-16 MED ORDER — INSULIN ASPART 100 UNIT/ML ~~LOC~~ SOLN: 5.0000 [IU] | SUBCUTANEOUS | 0 refills | Status: DC | PRN

## 2019-02-16 MED ORDER — INSULIN GLARGINE 100 UNIT/ML ~~LOC~~ SOLN: 20.0000 [IU] | Freq: Every day | SUBCUTANEOUS | 0 refills | Status: DC

## 2019-02-16 MED ORDER — FLUOXETINE HCL 20 MG PO CAPS: 20.0000 mg | ORAL_CAPSULE | Freq: Every day | ORAL | 0 refills | Status: DC

## 2019-02-16 MED ORDER — LOSARTAN POTASSIUM 50 MG PO TABS: 50.0000 mg | ORAL_TABLET | Freq: Two times a day (BID) | ORAL | 0 refills | Status: DC

## 2019-02-16 MED ORDER — FERROUS SULFATE 325 (65 FE) MG PO TABS: 325.0000 mg | ORAL_TABLET | Freq: Two times a day (BID) | ORAL | 0 refills | Status: DC

## 2019-02-16 MED ORDER — PANTOPRAZOLE SODIUM 40 MG PO TBEC: 40.0000 mg | DELAYED_RELEASE_TABLET | Freq: Two times a day (BID) | ORAL | 0 refills | Status: DC

## 2019-02-16 MED ORDER — COMBIGAN 0.2-0.5 % OP SOLN: 1.0000 [drp] | Freq: Two times a day (BID) | OPHTHALMIC | 0 refills | Status: DC

## 2019-02-16 MED ORDER — LEVOTHYROXINE SODIUM 50 MCG PO TABS: 50.0000 ug | ORAL_TABLET | Freq: Every day | ORAL | 0 refills | Status: DC

## 2019-02-16 MED ORDER — PRESERVISION AREDS 2 PO CAPS: 2.0000 | ORAL_CAPSULE | Freq: Every day | ORAL | 0 refills | Status: DC

## 2019-02-16 MED ORDER — METOPROLOL TARTRATE 25 MG PO TABS: 12.5000 mg | ORAL_TABLET | Freq: Two times a day (BID) | ORAL | 0 refills | Status: DC

## 2019-02-16 MED ORDER — APIXABAN 2.5 MG PO TABS: 2.5000 mg | ORAL_TABLET | Freq: Two times a day (BID) | ORAL | 0 refills | Status: DC

## 2019-02-16 MED ORDER — ROSUVASTATIN CALCIUM 20 MG PO TABS: 20.0000 mg | ORAL_TABLET | Freq: Every day | ORAL | 0 refills | Status: DC

## 2019-02-16 NOTE — Addendum Note (Signed)
Addended by: Durenda Age C on: 02/16/2019 11:58 AM   Modules accepted: Level of Service

## 2019-02-16 NOTE — Progress Notes (Signed)
Location:  [place] Room Number: [place] of Service:  SNF (31) Provider:  Durenda Age, DNP, FNP-BC  Patient Care Team: Windy Canny as PCP - General (Physician Assistant) Troy Sine, MD as PCP - Cardiology (Cardiology)  Extended Emergency Contact Information Primary Emergency Contact: Callicott,Ann Address: [address], [place] Phone: [phone] Relation: Other  Code Status:  Full Code  Goals of care: Advanced Directive information Advanced Directives 01/18/2019  Does Patient Have a Medical Advance Directive? Yes  Type of Advance Directive (No Data)  Does patient want to make changes to medical advance directive? No - Patient declined  Copy of [place] in Chart? -  Would patient like information on creating a medical advance directive? -  Pre-existing out of facility DNR order (yellow form or pink MOST form) -     Chief Complaint  Patient presents with  . Discharge Note    Discharge Visit    HPI:  Pt is a 85 y.o. female who is for discharge home with Home health PT, OT and Nurse for medication management. Sh rehabilitation. She was supposedly for discharge on 01/21/19 but was canceled and continued with her  She was admitted to [facility] on 01/07/2019 post hospitalization on 12/30/18 to 01/07/19.  She has PMH of breast cancer, hyperlipidemia, hypertension, hypothyroidism, OSA on CPAP, osteopenia, diabetes mellitus type 2, renal insufficiency and cataracts.  She presented to the hospital with generalized weakness and recurrent falls.  She was mildly confused and hypoglycemic to 44 when EMS arrived.  In the ED, hemoglobin was 5.2 and was transfused 2 units of blood. She had severe ulcerative esophagitis on endoscopy and was started on PPI.  FOBT negative and EKG revealed controlled atrial fibrillation.  She was restarted on Eliquis after  clearance by gastroenterology.  CT head/cervical spine/chest/abdomen/pelvis revealed left-sided rib fractures with rib contusion of left chest wall.  Trauma was consulted.  She was noted to have slurred speech and worked up for stroke.  MRA head concerning for significant atherosclerotic changes.  She was found to have a UTI and was started on ceftriaxone then Keflex.  Hospitalization was complicated by acute metabolic encephalopathy and acute urinary retention.  She was discharged on Foley catheter.  Foley catheter has now been discontinued.  No further urinary retention reported.  Patient was admitted to this facility for short-term rehabilitation after the patient's recent hospitalization.  Patient has completed SNF rehabilitation and therapy has cleared the patient for discharge.   Past Medical History:  Diagnosis Date  . Anemia   . Anemia of decreased vitamin B12 absorption 05/26/2005  . Arthritis   . Breast cancer ([place]) 2001  . Cataract   . History of tobacco abuse   . Hx of colonic polyps   . Hyperlipidemia   . Hypertension   . Hypothyroidism   . Obesity   . OSA on CPAP 09/2007   AHI 10.6/hr overall, 43.64/hr during REM  . Osteopenia   . Renal insufficiency   . Type 2 diabetes mellitus ([place])    Past Surgical History:  Procedure Laterality Date  . BACK SURGERY  05/30/2009  . BIOPSY  01/01/2019   Procedure: BIOPSY;  Surgeon: Milus Banister, MD;  Location: [facility] ENDOSCOPY;  Service: Endoscopy;;  . BOWEL RESECTION  1974  . CATARACT EXTRACTION Bilateral    bilateral  . CHOLECYSTECTOMY  1974  .  ESOPHAGOGASTRODUODENOSCOPY N/A 01/01/2019   Procedure: ESOPHAGOGASTRODUODENOSCOPY (EGD);  Surgeon: Milus Banister, MD;  Location: [facility] ENDOSCOPY;  Service: Endoscopy;  Laterality: N/A;  . MASTECTOMY Bilateral 2001   bilateral sentinel lymph nodes bio  . NM MYOCAR PERF WALL MOTION  06/2007   dipyridamole; perfusion defect in inferior myocardium consistent with diaphragmatic attenuation,  remaining myocardium with no ischemia/infarct, EF 73%; normal, low risk scan   . TOTAL ABDOMINAL HYSTERECTOMY  1975  . TRANSTHORACIC ECHOCARDIOGRAM  02/2010   123456, stage 1 diastolic dysfunction; borderline RV enlargement; LA mild-mod dilated; mild mitral annular calcif & mild MR; mild TR with normal RSVP, AV moderately sclerotics    Allergies  Allergen Reactions  . Codeine Sulfate Nausea Only    Outpatient Encounter Medications as of 02/16/2019  Medication Sig  . acetaminophen (TYLENOL) 500 MG tablet Take 2 tablets (1,000 mg total) by mouth every 8 (eight) hours as needed for mild pain, moderate pain or headache.  Marland Kitchen apixaban (ELIQUIS) 2.5 MG TABS tablet Take 1 tablet (2.5 mg total) by mouth 2 (two) times daily.  Marland Kitchen Apoaequorin (PREVAGEN PO) Take 1 capsule by mouth daily.  . brimonidine-timolol (COMBIGAN) 0.2-0.5 % ophthalmic solution Place 1 drop into both eyes every 12 (twelve) hours.  . feeding supplement, ENSURE ENLIVE, (ENSURE ENLIVE) LIQD Take 237 mLs by mouth 2 (two) times daily between meals.  . ferrous sulfate 325 (65 FE) MG tablet Take 1 tablet (325 mg total) by mouth 2 (two) times daily with a meal.  . FLUoxetine (PROZAC) 20 MG capsule Take 1 capsule (20 mg total) by mouth daily.  . insulin aspart (NOVOLOG) 100 UNIT/ML injection Inject 5 Units into the skin as needed for high blood sugar. Give 5units AC evening if glucose>250  . insulin glargine (LANTUS) 100 UNIT/ML injection Inject 0.2 mLs (20 Units total) into the skin at bedtime. Sliding scale  . levothyroxine (SYNTHROID) 50 MCG tablet Take 1 tablet (50 mcg total) by mouth daily.  Marland Kitchen losartan (COZAAR) 50 MG tablet Take 1 tablet (50 mg total) by mouth daily.  . metoprolol tartrate (LOPRESSOR) 25 MG tablet Take 0.5 tablets (12.5 mg total) by mouth 2 (two) times daily.  . Multiple Vitamins-Minerals (PRESERVISION AREDS 2) CAPS Take 2 capsules by mouth daily.  . pantoprazole (PROTONIX) 40 MG tablet Take 1 tablet (40 mg total) by  mouth 2 (two) times daily before a meal.  . polyethylene glycol (MIRALAX / GLYCOLAX) 17 g packet Take 17 g by mouth 2 (two) times daily as needed for moderate constipation.  . rosuvastatin (CRESTOR) 20 MG tablet Take 1 tablet (20 mg total) by mouth daily.  Marland Kitchen senna (SENOKOT) 8.6 MG TABS tablet Take 1 tablet (8.6 mg total) by mouth daily as needed for mild constipation.   No facility-administered encounter medications on file as of 02/16/2019.     Review of Systems  GENERAL: No change in appetite, no fatigue, no weight changes, no fever, chills or weakness MOUTH and THROAT: Denies oral discomfort, gingival pain or bleeding, pain from teeth or hoarseness   RESPIRATORY: no cough, SOB, DOE, wheezing, hemoptysis CARDIAC: No chest pain, edema or palpitations GI: No abdominal pain, diarrhea, constipation, heart burn, nausea or vomiting GU: Denies dysuria, frequency, hematuria, incontinence, or discharge NEUROLOGICAL: Denies dizziness, syncope, numbness, or headache PSYCHIATRIC: Denies feelings of depression or anxiety. No report of hallucinations, insomnia, paranoia, or agitation   Immunization History  Administered Date(s) Administered  . Influenza, High Dose Seasonal PF 01/30/2016  . Influenza-Unspecified 01/11/2019  . Pneumococcal  Conjugate-13 07/25/2013  . Pneumococcal Polysaccharide-23 08/06/2017  . Tdap 10/07/2017  . Zoster 08/08/2013  . Zoster Recombinat (Shingrix) 10/07/2017   Pertinent  Health Maintenance Due  Topic Date Due  . FOOT EXAM  07/21/1943  . OPHTHALMOLOGY EXAM  07/21/1943  . COLONOSCOPY  07/30/2014  . HEMOGLOBIN A1C  07/01/2019  . INFLUENZA VACCINE  Completed  . DEXA SCAN  Completed  . PNA vac Low Risk Adult  Completed   No flowsheet data found.   Vitals:   02/16/19 1202  BP: 124/78  Pulse: 87  Resp: 18  Temp: 98.2 F (36.8 C)  Weight: 193 lb 3.2 oz (87.6 kg)  Height: 5\' 8"  (1.727 m)   Body mass index is 29.38 kg/m.  Physical Exam  GENERAL  APPEARANCE: Well nourished. In no acute distress. Normal body habitus SKIN:  Skin is warm and dry.  MOUTH and THROAT: Lips are without lesions. Oral mucosa is moist and without lesions. Tongue is normal in shape, size, and color and without lesions RESPIRATORY: Breathing is even & unlabored, BS CTAB CARDIAC: Irregular heart rhythm, no murmur,no extra heart sounds GI: Abdomen soft, normal BS, no masses, no tenderness EXTREMITIES:  Able to move X 4 extremities NEUROLOGICAL: There is no tremor. Speech is clear. Alert and oriented X 3. PSYCHIATRIC:  Affect and behavior are appropriate  Labs reviewed: Recent Labs    01/04/19 0330 01/05/19 0403 01/06/19 0329 01/07/19 0305  NA 128* 131* 133*  --   K 4.4 4.9 4.8  --   CL 99 103 104  --   CO2 21* 21* 22  --   GLUCOSE 172* 240* 187*  --   BUN 22 26* 23  --   CREATININE 1.48* 1.58* 1.25*  --   CALCIUM 8.3* 8.1* 8.1*  --   MG 1.8 1.7 1.7 1.7  PHOS  --   --  3.8  --    Recent Labs    01/03/19 0523 01/04/19 0330 01/05/19 0403  AST 19 15 28   ALT 17 16 23   ALKPHOS 53 58 62  BILITOT 0.5 0.7 0.2*  PROT 4.8* 4.9* 4.6*  ALBUMIN 2.1* 2.0* 1.9*   Recent Labs    12/30/18 2246  01/04/19 0330 01/05/19 0403 01/06/19 0329  WBC 6.7   < > 9.4 6.9 9.5  NEUTROABS 5.4  --   --   --   --   HGB 5.2*   < > 7.5* 7.3* 7.3*  HCT 20.0*   < > 26.0* 25.7* 25.5*  MCV 62.9*   < > 72.6* 74.5* 74.8*  PLT 402*   < > 313 301 317   < > = values in this interval not displayed.   Lab Results  Component Value Date   TSH 4.185 12/31/2018   Lab Results  Component Value Date   HGBA1C 5.2 12/31/2018   Lab Results  Component Value Date   CHOL  07/05/2009    115        ATP III CLASSIFICATION:  <200     mg/dL   Desirable  200-239  mg/dL   Borderline High  >=240    mg/dL   High          HDL 42 07/05/2009   LDLCALC  07/05/2009    51        Total Cholesterol/HDL:CHD Risk Coronary Heart Disease Risk Table                     Men  Women  1/2 Average  Risk   3.4   3.3  Average Risk       5.0   4.4  2 X Average Risk   9.6   7.1  3 X Average Risk  23.4   11.0        Use the calculated Patient Ratio above and the CHD Risk Table to determine the patient's CHD Risk.        ATP III CLASSIFICATION (LDL):  <100     mg/dL   Optimal  100-129  mg/dL   Near or Above                    Optimal  130-159  mg/dL   Borderline  160-189  mg/dL   High  >190     mg/dL   Very High   TRIG 108 07/05/2009   CHOLHDL 2.7 07/05/2009     Assessment/Plan  1. Symptomatic anemia - S/P transfusion of 2 units of blood, EGD with severe ulcerative esophagitis, GE junction stricture (biopsy negative for dysplasia) - Multiple Vitamins-Minerals (PRESERVISION AREDS 2) CAPS; Take 2 capsules by mouth daily.  Dispense: 60 capsule; Refill: 0 - ferrous sulfate 325 (65 FE) MG tablet; Take 1 tablet (325 mg total) by mouth 2 (two) times daily with a meal.  Dispense: 60 tablet; Refill: 0  2. Persistent atrial fibrillation ([place]) -Follow-up with cardiology and rediscuss risk and benefits of Eliquis . She has history of falls at home - apixaban (ELIQUIS) 2.5 MG TABS tablet; Take 1 tablet (2.5 mg total) by mouth 2 (two) times daily.  Dispense: 60 tablet; Refill: 0 - metoprolol tartrate (LOPRESSOR) 25 MG tablet; Take 0.5 tablets (12.5 mg total) by mouth 2 (two) times daily.  Dispense: 30 tablet; Refill: 0  3. Essential hypertension - losartan (COZAAR) 50 MG tablet; Take 1 tablet (50 mg total) by mouth 2 (two) times daily.  Dispense: 60 tablet; Refill: 0 - metoprolol tartrate (LOPRESSOR) 25 MG tablet; Take 0.5 tablets (12.5 mg total) by mouth 2 (two) times daily.  Dispense: 30 tablet; Refill: 0  4. Ulcerative esophagitis -EGD with severe ulcerative esophagitis, GE junction stricture (biopsy negative for dysplasia), follow-up with GI - pantoprazole (PROTONIX) 40 MG tablet; Take 1 tablet (40 mg total) by mouth 2 (two) times daily before a meal.  Dispense: 60 tablet; Refill: 0   5. Hypothyroidism, unspecified type Lab Results  Component Value Date   TSH 4.185 12/31/2018   - levothyroxine (SYNTHROID) 50 MCG tablet; Take 1 tablet (50 mcg total) by mouth daily.  Dispense: 30 tablet; Refill: 0  6. Type 2 diabetes mellitus with hyperglycemia, with long-term current use of insulin (HCC) Lab Results  Component Value Date   HGBA1C 5.2 12/31/2018   - insulin aspart (NOVOLOG) 100 UNIT/ML injection; Inject 5 Units into the skin as needed for high blood sugar. Give 5units AC evening if glucose>250  Dispense: 10 mL; Refill: 0 - insulin glargine (LANTUS) 100 UNIT/ML injection; Inject 0.2 mLs (20 Units total) into the skin at bedtime. Sliding scale  Dispense: 10 mL; Refill: 0  7. Depression, recurrent (HCC) - FLUoxetine (PROZAC) 20 MG capsule; Take 1 capsule (20 mg total) by mouth daily.  Dispense: 30 capsule; Refill: 0  8. Closed fracture of multiple ribs of left side, sequela -Continue acetaminophen 500 mg two tabs = 1000 mg every 8 hours as needed for pain, fall precautions  9. Hyperlipidemia associated with type 2 diabetes mellitus ([place]) Lab Results  Component Value  Date   CHOL  07/05/2009    115        ATP III CLASSIFICATION:  <200     mg/dL   Desirable  200-239  mg/dL   Borderline High  >=240    mg/dL   High          HDL 42 07/05/2009   LDLCALC  07/05/2009    51        Total Cholesterol/HDL:CHD Risk Coronary Heart Disease Risk Table                     Men   Women  1/2 Average Risk   3.4   3.3  Average Risk       5.0   4.4  2 X Average Risk   9.6   7.1  3 X Average Risk  23.4   11.0        Use the calculated Patient Ratio above and the CHD Risk Table to determine the patient's CHD Risk.        ATP III CLASSIFICATION (LDL):  <100     mg/dL   Optimal  100-129  mg/dL   Near or Above                    Optimal  130-159  mg/dL   Borderline  160-189  mg/dL   High  >190     mg/dL   Very High   TRIG 108 07/05/2009   CHOLHDL 2.7 07/05/2009    -  rosuvastatin (CRESTOR) 20 MG tablet; Take 1 tablet (20 mg total) by mouth daily.  Dispense: 30 tablet; Refill: 0    I have filled out patient's discharge paperwork and written prescriptions.  Patient will receive home health PT, OT and Nurse for medication management.  DME provided:  None  Total discharge time: Greater than 30 minutes Greater than 50% was spent in counseling and coordination of care.   Discharge time involved coordination of the discharge process with social worker, nursing staff and therapy department. Medical justification for home health services verified.   Durenda Age, DNP, FNP-BC [facility] and Adult Medicine [phone] (Monday-Friday 8:00 a.m. - 5:00 p.m.) [phone] (after hours)

## 2019-02-18 DIAGNOSIS — D631 Anemia in chronic kidney disease: Secondary | ICD-10-CM

## 2019-02-18 DIAGNOSIS — M129 Arthropathy, unspecified: Secondary | ICD-10-CM

## 2019-02-18 DIAGNOSIS — E1169 Type 2 diabetes mellitus with other specified complication: Secondary | ICD-10-CM

## 2019-02-18 DIAGNOSIS — I672 Cerebral atherosclerosis: Secondary | ICD-10-CM

## 2019-02-18 DIAGNOSIS — I129 Hypertensive chronic kidney disease with stage 1 through stage 4 chronic kidney disease, or unspecified chronic kidney disease: Secondary | ICD-10-CM

## 2019-02-18 DIAGNOSIS — K2210 Ulcer of esophagus without bleeding: Secondary | ICD-10-CM

## 2019-02-18 DIAGNOSIS — E1122 Type 2 diabetes mellitus with diabetic chronic kidney disease: Secondary | ICD-10-CM

## 2019-02-18 DIAGNOSIS — S2242XD Multiple fractures of ribs, left side, subsequent encounter for fracture with routine healing: Secondary | ICD-10-CM | POA: Diagnosis not present

## 2019-02-19 ENCOUNTER — Other Ambulatory Visit: Payer: Self-pay | Admitting: Cardiovascular Disease

## 2019-02-21 ENCOUNTER — Ambulatory Visit: Payer: Medicare Other | Admitting: Gastroenterology

## 2019-02-24 ENCOUNTER — Other Ambulatory Visit: Payer: Self-pay | Admitting: Cardiovascular Disease

## 2019-02-24 ENCOUNTER — Ambulatory Visit: Payer: Medicare Other | Admitting: Physician Assistant

## 2019-02-28 DIAGNOSIS — I4819 Other persistent atrial fibrillation: Secondary | ICD-10-CM

## 2019-02-28 DIAGNOSIS — R296 Repeated falls: Secondary | ICD-10-CM | POA: Insufficient documentation

## 2019-02-28 DIAGNOSIS — K449 Diaphragmatic hernia without obstruction or gangrene: Secondary | ICD-10-CM

## 2019-02-28 DIAGNOSIS — E871 Hypo-osmolality and hyponatremia: Secondary | ICD-10-CM | POA: Insufficient documentation

## 2019-03-15 ENCOUNTER — Telehealth: Payer: Self-pay

## 2019-03-15 NOTE — Telephone Encounter (Signed)
Appointment has been canceled.

## 2019-03-15 NOTE — Telephone Encounter (Signed)
Patient's husband left a voicemail stating his wife is being discharged to [facility] and that her appt with Dr. Jaynee Eagles will need to be cancelled.

## 2019-03-17 ENCOUNTER — Ambulatory Visit: Payer: Medicare Other | Admitting: Neurology

## 2019-04-25 ENCOUNTER — Other Ambulatory Visit: Payer: Self-pay | Admitting: Adult Health

## 2019-04-25 DIAGNOSIS — I4819 Other persistent atrial fibrillation: Secondary | ICD-10-CM

## 2019-04-25 DIAGNOSIS — I10 Essential (primary) hypertension: Secondary | ICD-10-CM

## 2019-05-21 ENCOUNTER — Other Ambulatory Visit: Payer: Self-pay | Admitting: Adult Health

## 2019-05-21 DIAGNOSIS — I4819 Other persistent atrial fibrillation: Secondary | ICD-10-CM

## 2019-05-21 DIAGNOSIS — K2210 Ulcer of esophagus without bleeding: Secondary | ICD-10-CM

## 2019-05-21 DIAGNOSIS — I10 Essential (primary) hypertension: Secondary | ICD-10-CM

## 2019-05-23 ENCOUNTER — Other Ambulatory Visit: Payer: Self-pay | Admitting: Adult Health

## 2019-05-23 DIAGNOSIS — I10 Essential (primary) hypertension: Secondary | ICD-10-CM

## 2019-05-23 DIAGNOSIS — I4819 Other persistent atrial fibrillation: Secondary | ICD-10-CM

## 2019-05-23 DIAGNOSIS — K2210 Ulcer of esophagus without bleeding: Secondary | ICD-10-CM

## 2019-05-28 ENCOUNTER — Other Ambulatory Visit: Payer: Self-pay | Admitting: Adult Health

## 2019-05-28 DIAGNOSIS — I10 Essential (primary) hypertension: Secondary | ICD-10-CM

## 2019-07-09 ENCOUNTER — Other Ambulatory Visit: Payer: Self-pay | Admitting: Adult Health

## 2019-07-09 DIAGNOSIS — I10 Essential (primary) hypertension: Secondary | ICD-10-CM

## 2019-08-25 ENCOUNTER — Telehealth: Payer: Self-pay

## 2019-08-25 NOTE — Telephone Encounter (Signed)
Patient called and left voicemail with no name, phone number , etc. I called back and patient friend Remo Lipps states that patient "Diana [name]" is out of Losartan 50mg  and she doesn't understand why medication keeps getting rejected from [facility]. I advised her that this facility is not the patient PCP so that's why medication is being rejected. I advised patient friend to call PCP Office in order to have medication refilled. Patient friend Remo Lipps states that the office will not answer. I volunteered to call office and notify them for her. Patient friend states that her friend has very high BP right now. I recommended they visit the ER if blood pressure is not doing well. She insisted to have medication for friend instead. I called office and office confirmed that they sent medication into pharmacy for patient. Patient was called back and notified to get medication from pharmacy because PCP office already filled medication. Patient friend Remo Lipps agreed to get medication and Thanked me for helping get medication refill figured out.

## 2019-12-26 ENCOUNTER — Other Ambulatory Visit: Payer: Self-pay | Admitting: Adult Health

## 2019-12-26 DIAGNOSIS — E039 Hypothyroidism, unspecified: Secondary | ICD-10-CM

## 2019-12-27 DIAGNOSIS — Z742 Need for assistance at home and no other household member able to render care: Secondary | ICD-10-CM | POA: Insufficient documentation

## 2019-12-27 LAB — HM DIABETES FOOT EXAM

## 2020-02-07 ENCOUNTER — Emergency Department (HOSPITAL_COMMUNITY)
Admission: EM | Admit: 2020-02-07 | Discharge: 2020-02-07 | Disposition: A | Discharge status: Home / Self Care | Payer: Medicare PPO | Attending: Emergency Medicine | Admitting: Emergency Medicine

## 2020-02-07 ENCOUNTER — Emergency Department (HOSPITAL_COMMUNITY): Payer: Medicare PPO

## 2020-02-07 ENCOUNTER — Other Ambulatory Visit: Payer: Self-pay

## 2020-02-07 DIAGNOSIS — S42432A Displaced fracture (avulsion) of lateral epicondyle of left humerus, initial encounter for closed fracture: Secondary | ICD-10-CM | POA: Diagnosis not present

## 2020-02-07 DIAGNOSIS — E039 Hypothyroidism, unspecified: Secondary | ICD-10-CM | POA: Insufficient documentation

## 2020-02-07 DIAGNOSIS — I4891 Unspecified atrial fibrillation: Secondary | ICD-10-CM | POA: Insufficient documentation

## 2020-02-07 DIAGNOSIS — Z794 Long term (current) use of insulin: Secondary | ICD-10-CM | POA: Insufficient documentation

## 2020-02-07 DIAGNOSIS — W19XXXA Unspecified fall, initial encounter: Secondary | ICD-10-CM | POA: Insufficient documentation

## 2020-02-07 DIAGNOSIS — I10 Essential (primary) hypertension: Secondary | ICD-10-CM | POA: Diagnosis not present

## 2020-02-07 DIAGNOSIS — S0990XA Unspecified injury of head, initial encounter: Secondary | ICD-10-CM | POA: Insufficient documentation

## 2020-02-07 DIAGNOSIS — Z7901 Long term (current) use of anticoagulants: Secondary | ICD-10-CM | POA: Diagnosis not present

## 2020-02-07 DIAGNOSIS — S52045A Nondisplaced fracture of coronoid process of left ulna, initial encounter for closed fracture: Secondary | ICD-10-CM

## 2020-02-07 DIAGNOSIS — S4992XA Unspecified injury of left shoulder and upper arm, initial encounter: Secondary | ICD-10-CM | POA: Diagnosis present

## 2020-02-07 DIAGNOSIS — Z79899 Other long term (current) drug therapy: Secondary | ICD-10-CM | POA: Diagnosis not present

## 2020-02-07 DIAGNOSIS — Y92009 Unspecified place in unspecified non-institutional (private) residence as the place of occurrence of the external cause: Secondary | ICD-10-CM

## 2020-02-07 LAB — COMPREHENSIVE METABOLIC PANEL
ALT: 13 U/L (ref 0–44)
AST: 21 U/L (ref 15–41)
Albumin: 3.3 g/dL — ABNORMAL LOW (ref 3.5–5.0)
Alkaline Phosphatase: 69 U/L (ref 38–126)
Anion gap: 12 (ref 5–15)
BUN: 25 mg/dL — ABNORMAL HIGH (ref 8–23)
CO2: 26 mmol/L (ref 22–32)
Calcium: 9 mg/dL (ref 8.9–10.3)
Chloride: 103 mmol/L (ref 98–111)
Creatinine, Ser: 1.36 mg/dL — ABNORMAL HIGH (ref 0.44–1.00)
GFR, Estimated: 38 mL/min — ABNORMAL LOW (ref >60)
Glucose, Bld: 97 mg/dL (ref 70–99)
Potassium: 3.3 mmol/L — ABNORMAL LOW (ref 3.5–5.1)
Sodium: 141 mmol/L (ref 135–145)
Total Bilirubin: 1.2 mg/dL (ref 0.3–1.2)
Total Protein: 6.4 g/dL — ABNORMAL LOW (ref 6.5–8.1)

## 2020-02-07 LAB — CBC WITH DIFFERENTIAL/PLATELET
Abs Immature Granulocytes: 0.04 10*3/uL (ref 0.00–0.07)
Basophils Absolute: 0 10*3/uL (ref 0.0–0.1)
Basophils Relative: 0 %
Eosinophils Absolute: 0 10*3/uL (ref 0.0–0.5)
Eosinophils Relative: 0 %
HCT: 44.8 % (ref 36.0–46.0)
Hemoglobin: 14.2 g/dL (ref 12.0–15.0)
Immature Granulocytes: 0 %
Lymphocytes Relative: 5 %
Lymphs Abs: 0.6 10*3/uL — ABNORMAL LOW (ref 0.7–4.0)
MCH: 29.6 pg (ref 26.0–34.0)
MCHC: 31.7 g/dL (ref 30.0–36.0)
MCV: 93.5 fL (ref 80.0–100.0)
Monocytes Absolute: 1.2 10*3/uL — ABNORMAL HIGH (ref 0.1–1.0)
Monocytes Relative: 11 %
Neutro Abs: 8.5 10*3/uL — ABNORMAL HIGH (ref 1.7–7.7)
Neutrophils Relative %: 84 %
Platelets: 223 10*3/uL (ref 150–400)
RBC: 4.79 MIL/uL (ref 3.87–5.11)
RDW: 14 % (ref 11.5–15.5)
WBC: 10.3 10*3/uL (ref 4.0–10.5)
nRBC: 0 % (ref 0.0–0.2)

## 2020-02-07 LAB — CBG MONITORING, ED: Glucose-Capillary: 101 mg/dL — ABNORMAL HIGH (ref 70–99)

## 2020-02-07 MED ORDER — FENTANYL CITRATE (PF) 100 MCG/2ML IJ SOLN
50.0000 ug | Freq: Once | INTRAMUSCULAR | Status: AC
  Administered 2020-02-07: 50 ug via INTRAVENOUS
  Filled 2020-02-07: qty 2

## 2020-02-07 MED ORDER — ACETAMINOPHEN 500 MG PO TABS
1000.0000 mg | ORAL_TABLET | Freq: Once | ORAL | Status: AC
  Administered 2020-02-07: 1000 mg via ORAL
  Filled 2020-02-07: qty 2

## 2020-02-07 MED ORDER — ONDANSETRON HCL 4 MG/2ML IJ SOLN
4.0000 mg | Freq: Once | INTRAMUSCULAR | Status: AC
  Administered 2020-02-07: 4 mg via INTRAVENOUS
  Filled 2020-02-07: qty 2

## 2020-02-07 NOTE — Progress Notes (Signed)
   02/07/20 1706  TOC ED Mini Assessment  TOC Time spent with patient (minutes): 15  PING Used in TOC Assessment No  Admission or Readmission Diverted No  What brought you to the Emergency Department?  fall at home  Barriers to Discharge  ([place])  ED CM was consulted by EDP for [facility] needs. ED RNCM met with patient in [place] bed 20 concerning [place] recommendations. Discussed [name] services with patient and caregiver.  Patient declined home health services at this time, she states her POA has made arrangements to have [facility] come in to assist due to her fx lt arm, so she states, she will decline the [facility] services.

## 2020-02-07 NOTE — ED Notes (Signed)
Patient transported to X-ray 

## 2020-02-07 NOTE — Discharge Instructions (Addendum)
Please read instructions below. Keep the splint clean, dry and in place at all times. Elevate your arm as much as possible. You can take tylenol every 4-6 hours as needed for pain. Call the orthopedic specialist office the next business day to schedule an appointment for repeat xray and follow up on your injury. Return to the ED for concerning symptoms.  Discuss your incidental CT findings regarding your parotid gland and thyroid gland with your primary care provider.

## 2020-02-07 NOTE — ED Triage Notes (Signed)
BIB [facility] w/ complaints of a fall. Pt found by home health nurse on the ground around 11:15. Unknown of exactly when the patient fell. Pt was found completely soiled per EMS. Pt able to say that she was walking with two walkers and she slid down and fell. Unknown LOC, unknown if pt is on a blood thinner. Pt only complaining of left arm pain.Pt AOx3 Pt also reporting that being aware of sugar dropping is a new thing for her and she is newly on insulin.   Pt v/s BP-176/100  HR-90-120 (in Afib)  Resp-18  99% SpO2  CBG 67 initially and then improved to 84 after given some crackers.

## 2020-02-07 NOTE — ED Provider Notes (Signed)
[facility] EMERGENCY DEPARTMENT Provider Note   CSN: [id number] Arrival date & time: 02/07/20  1224     History Chief Complaint  Patient presents with  . Fall    NA WALDRIP is a 86 y.o. female w PMHx HTN, T2DM, persistent atrial fibrillation on Eliquis, breast cancer, presenting to the emergency department via EMS after unwitnessed fall.  Patient states she believes her feet got tangled up between walkers and she fell.  She thinks she hit her left face because initially she thought her her teeth were bothering her.  She is having may be a mild headache on the left though nothing severe.  She is mostly complaining of left elbow pain it is severely worse with any movement.  She denies numbness to her hand.  She states usually she uses a wheelchair to get around.  Denies any back pain or injuries to her other extremities.  She also states her sugar has been intermittently low due to new insulin regimen.  Triage note reports patient was found soiled, down for unknown period of time. Patient is alert and oriented to person, place, month and year. States she fell this morning trying to get back to her bed, got tangled in the walkers that were surrounding her bed. She states she was not down for an extended period of time.  The history is provided by the patient.       Past Medical History:  Diagnosis Date  . Anemia   . Anemia of decreased vitamin B12 absorption 05/26/2005  . Arthritis   . Breast cancer ([place]) 2001  . Cataract   . History of tobacco abuse   . Hx of colonic polyps   . Hyperlipidemia   . Hypertension   . Hypothyroidism   . Obesity   . OSA on CPAP 09/2007   AHI 10.6/hr overall, 43.64/hr during REM  . Osteopenia   . Renal insufficiency   . Type 2 diabetes mellitus [facility])     Patient Active Problem List   Diagnosis Date Noted  . Depression, recurrent ([place]) 01/21/2019  . Hyperlipidemia associated with type 2 diabetes mellitus ([place])  01/21/2019  . Acute metabolic encephalopathy 19/14/7829  . Fall 01/11/2019  . Klebsiella UTI (urinary tract infection) 01/11/2019  . Acute urinary retention 01/11/2019  . Left kidney lesion 01/11/2019  . Ulcerative esophagitis 01/11/2019  . Persistent atrial fibrillation ([place])   . Anemia 01/01/2019  . Hiatal hernia   . Symptomatic anemia 12/31/2018  . Hypoglycemia 12/31/2018  . Ribs, multiple fractures 12/31/2018  . HTN (hypertension) 05/15/2013  . OSA on CPAP 05/15/2013  . Hypothyroidism 05/15/2013  . DM2 (diabetes mellitus, type 2) ([place]) 05/15/2013  . Lower extremity edema 05/15/2013  . Renal insufficiency 05/15/2013  . Morbid obesity ([place]) 05/15/2013  . Breast cancer ([place]) 03/18/2011    Past Surgical History:  Procedure Laterality Date  . BACK SURGERY  05/30/2009  . BIOPSY  01/01/2019   Procedure: BIOPSY;  Surgeon: Milus Banister, MD;  Location[facility] ENDOSCOPY;  Service: Endoscopy;;  . BOWEL RESECTION  1974  . CATARACT EXTRACTION Bilateral    bilateral  . CHOLECYSTECTOMY  1974  . ESOPHAGOGASTRODUODENOSCOPY N/A 01/01/2019   Procedure: ESOPHAGOGASTRODUODENOSCOPY (EGD);  Surgeon: Milus Banister, MD;  Location: [facility] ENDOSCOPY;  Service: Endoscopy;  Laterality: N/A;  . MASTECTOMY Bilateral 2001   bilateral sentinel lymph nodes bio  . NM MYOCAR PERF WALL MOTION  06/2007   dipyridamole; perfusion defect in inferior myocardium consistent with diaphragmatic attenuation,  remaining myocardium with no ischemia/infarct, EF 73%; normal, low risk scan   . TOTAL ABDOMINAL HYSTERECTOMY  1975  . TRANSTHORACIC ECHOCARDIOGRAM  02/2010   QJ=>19%, stage 1 diastolic dysfunction; borderline RV enlargement; LA mild-mod dilated; mild mitral annular calcif & mild MR; mild TR with normal RSVP, AV moderately sclerotics     OB History   No obstetric history on file.     Family History  Problem Relation Age of Onset  . Heart attack Mother   . Heart attack Father   . Colon cancer Neg Hx      Social History   Tobacco Use  . Smoking status: Former Smoker    Years: 13.00    Types: Cigarettes    Quit date: 05/02/2002    Years since quitting: 17.7  . Smokeless tobacco: Never Used  Vaping Use  . Vaping Use: Never used  Substance Use Topics  . Alcohol use: No    Alcohol/week: 0.0 standard drinks  . Drug use: Never    Home Medications Prior to Admission medications   Medication Sig Start Date End Date Taking? Authorizing Provider  apixaban (ELIQUIS) 2.5 MG TABS tablet Take 1 tablet (2.5 mg total) by mouth 2 (two) times daily. 02/16/19   Medina-Vargas, Monina C, NP  Apoaequorin (PREVAGEN PO) Take 1 capsule by mouth daily.    [provider]  brimonidine-timolol (COMBIGAN) 0.2-0.5 % ophthalmic solution Place 1 drop into both eyes every 12 (twelve) hours. 02/16/19   Medina-Vargas, Monina C, NP  feeding supplement, ENSURE ENLIVE, (ENSURE ENLIVE) LIQD Take 237 mLs by mouth 2 (two) times daily between meals. 01/07/19   Mercy Riding, MD  ferrous sulfate 325 (65 FE) MG tablet Take 1 tablet (325 mg total) by mouth 2 (two) times daily with a meal. 02/16/19   Medina-Vargas, Monina C, NP  FLUoxetine (PROZAC) 20 MG capsule Take 1 capsule (20 mg total) by mouth daily. 02/16/19   Medina-Vargas, Monina C, NP  insulin aspart (NOVOLOG) 100 UNIT/ML injection Inject 5 Units into the skin as needed for high blood sugar. Give 5units AC evening if glucose>250 02/16/19   Medina-Vargas, Monina C, NP  insulin glargine (LANTUS) 100 UNIT/ML injection Inject 0.2 mLs (20 Units total) into the skin at bedtime. Sliding scale 02/16/19   Medina-Vargas, Monina C, NP  levothyroxine (SYNTHROID) 50 MCG tablet Take 1 tablet (50 mcg total) by mouth daily. 02/16/19   Medina-Vargas, Monina C, NP  losartan (COZAAR) 50 MG tablet Take 1 tablet (50 mg total) by mouth 2 (two) times daily. 02/16/19   Medina-Vargas, Monina C, NP  metoprolol tartrate (LOPRESSOR) 25 MG tablet Take 0.5 tablets (12.5 mg total) by mouth 2 (two)  times daily. 02/16/19   Medina-Vargas, Monina C, NP  Multiple Vitamins-Minerals (PRESERVISION AREDS 2) CAPS Take 2 capsules by mouth daily. 02/16/19   Medina-Vargas, Monina C, NP  pantoprazole (PROTONIX) 40 MG tablet Take 1 tablet (40 mg total) by mouth 2 (two) times daily before a meal. 02/16/19   Medina-Vargas, Monina C, NP  polyethylene glycol (MIRALAX / GLYCOLAX) 17 g packet Take 17 g by mouth 2 (two) times daily as needed for moderate constipation. 01/07/19   Mercy Riding, MD  rosuvastatin (CRESTOR) 20 MG tablet Take 1 tablet (20 mg total) by mouth daily. 02/16/19   Medina-Vargas, Monina C, NP  senna (SENOKOT) 8.6 MG TABS tablet Take 1 tablet (8.6 mg total) by mouth daily as needed for mild constipation. 01/07/19   Mercy Riding, MD  Allergies    Codeine sulfate  Review of Systems   Review of Systems  Musculoskeletal: Positive for arthralgias.  All other systems reviewed and are negative.   Physical Exam Updated Vital Signs BP (!) 173/78 (BP Location: Right Arm)   Pulse 91   Temp 99.1 F (37.3 C) (Oral)   Resp 18   Ht 5\' 7"  (1.702 m)   Wt 89.8 kg   SpO2 96%   BMI 31.01 kg/m   Physical Exam Vitals and nursing note reviewed.  Constitutional:      General: She is not in acute distress.    Appearance: She is well-developed. She is not ill-appearing.  HENT:     Head: Normocephalic and atraumatic.     Comments: No scalp hematoma or tenderness.  No raccoon eyes or battle sign. Eyes:     Extraocular Movements: Extraocular movements intact.     Conjunctiva/sclera: Conjunctivae normal.     Pupils: Pupils are equal, round, and reactive to light.  Neck:     Comments: C-collar in place per EMS.  No tenderness with palpation of the midline C-spine or paracervical region  Cardiovascular:     Rate and Rhythm: Normal rate and regular rhythm.  Pulmonary:     Effort: Pulmonary effort is normal. No respiratory distress.     Breath sounds: Normal breath sounds.  Abdominal:      General: Bowel sounds are normal.     Palpations: Abdomen is soft.     Tenderness: There is no abdominal tenderness.  Musculoskeletal:     Comments: Left elbow with bruising and mild swelling noted.  Pain with any movement, tender to both lateral and medial epicondyles. The wrist is atraumatic with normal range of motion.  Left shoulder is also atraumatic. This is stable.  No pain with internal extra rotation of the hips bilaterally.  Knees and ankles are nontender, no evidence of trauma   Skin:    General: Skin is warm.  Neurological:     Mental Status: She is alert.     Comments: Patient is alert and oriented, speech is fluent without aphasia.  Following simple commands.  Normal tone.  Equal grip strength to bilateral upper extremities.  Sensation is grossly normal and equal to all 4 extremities.  Psychiatric:        Behavior: Behavior normal.     ED Results / Procedures / Treatments   Labs (all labs ordered are listed, but only abnormal results are displayed) Labs Reviewed  COMPREHENSIVE METABOLIC PANEL - Abnormal; Notable for the following components:      Result Value   Potassium 3.3 (*)    BUN 25 (*)    Creatinine, Ser 1.36 (*)    Total Protein 6.4 (*)    Albumin 3.3 (*)    GFR, Estimated 38 (*)    All other components within normal limits  CBC WITH DIFFERENTIAL/PLATELET - Abnormal; Notable for the following components:   Neutro Abs 8.5 (*)    Lymphs Abs 0.6 (*)    Monocytes Absolute 1.2 (*)    All other components within normal limits  CBG MONITORING, ED - Abnormal; Notable for the following components:   Glucose-Capillary 101 (*)    All other components within normal limits    EKG EKG Interpretation  Date/Time:  Tuesday February 07 2020 12:50:35 EST Ventricular Rate:  99 PR Interval:    QRS Duration: 87 QT Interval:  421 QTC Calculation: 541 R Axis:   -52 Text Interpretation: Atrial fibrillation  LAD, consider left anterior fascicular block Borderline repol  abnormality, diffuse leads Prolonged QT interval No significant change since last tracing Confirmed by Gareth Morgan [phone]) on 02/07/2020 3:53:57 PM   Radiology DG Elbow Complete Left  Result Date: 02/07/2020 CLINICAL DATA:  Posterior left elbow pain and bruising after fall. EXAM: LEFT ELBOW - COMPLETE 3+ VIEW COMPARISON:  None. FINDINGS: Acute fracture of the lateral epicondyle. Acute nondisplaced fracture of the coronoid process. Large joint effusion. No dislocation. Joint spaces are preserved. Osteopenia. Posterior elbow soft tissue swelling. IMPRESSION: 1. Acute fractures of the lateral epicondyle and coronoid process. 2. Large joint effusion. Electronically Signed   By: Titus Dubin M.D.   On: 02/07/2020 13:39   CT Head Wo Contrast  Result Date: 02/07/2020 CLINICAL DATA:  Trauma.  Found down. EXAM: CT HEAD WITHOUT CONTRAST CT CERVICAL SPINE WITHOUT CONTRAST TECHNIQUE: Multidetector CT imaging of the head and cervical spine was performed following the standard protocol without intravenous contrast. Multiplanar CT image reconstructions of the cervical spine were also generated. COMPARISON:  12/30/2018. FINDINGS: CT HEAD FINDINGS Brain: No evidence of acute large vascular territory infarction, hemorrhage, hydrocephalus, extra-axial collection or mass lesion/mass effect. Similar patchy white matter hypoattenuation, most likely related to chronic microvascular ischemic disease. Generalized atrophy with ex vacuo ventricular dilation. Vascular: Calcific atherosclerosis. Skull: No acute fracture. Sinuses/Orbits: Visualized sinuses are largely clear. Unremarkable orbits. Other: Chronic right mastoid effusion. Unchanged 1 cm right parotid nodule. CT CERVICAL SPINE FINDINGS Alignment: Similar alignment.  No substantial subluxation. Skull base and vertebrae: No acute fracture. Vertebral body heights are maintained. Osteopenia. Soft tissues and spinal canal: No prevertebral fluid or swelling. No visible  canal hematoma. Disc levels:  Similar disc degenerative change at C6-C7. Upper chest: Redemonstrated calcified nodule within the right thyroid lobe. No evidence of acute abnormality. Other: Calcific atherosclerosis peer IMPRESSION: CT head: 1. No evidence of acute intracranial abnormality. 2. Similar chronic microvascular ischemic disease and generalized atrophy 3. Similar nonspecific 1 cm right parotid lesion, which could be benign or malignant. Consider ENT consultation and possibly ENT sampling for further evaluation, if clinically indicated. CT cervical spine: 1. No evidence of acute fracture or traumatic malalignment cervical spine. 2. Similar approximately 2 cm calcified right thyroid lesion. Consider outpatient thyroid ultrasound for further evaluation. Electronically Signed   By: Margaretha Sheffield MD   On: 02/07/2020 15:49   CT Cervical Spine Wo Contrast  Result Date: 02/07/2020 CLINICAL DATA:  Trauma.  Found down. EXAM: CT HEAD WITHOUT CONTRAST CT CERVICAL SPINE WITHOUT CONTRAST TECHNIQUE: Multidetector CT imaging of the head and cervical spine was performed following the standard protocol without intravenous contrast. Multiplanar CT image reconstructions of the cervical spine were also generated. COMPARISON:  12/30/2018. FINDINGS: CT HEAD FINDINGS Brain: No evidence of acute large vascular territory infarction, hemorrhage, hydrocephalus, extra-axial collection or mass lesion/mass effect. Similar patchy white matter hypoattenuation, most likely related to chronic microvascular ischemic disease. Generalized atrophy with ex vacuo ventricular dilation. Vascular: Calcific atherosclerosis. Skull: No acute fracture. Sinuses/Orbits: Visualized sinuses are largely clear. Unremarkable orbits. Other: Chronic right mastoid effusion. Unchanged 1 cm right parotid nodule. CT CERVICAL SPINE FINDINGS Alignment: Similar alignment.  No substantial subluxation. Skull base and vertebrae: No acute fracture. Vertebral body  heights are maintained. Osteopenia. Soft tissues and spinal canal: No prevertebral fluid or swelling. No visible canal hematoma. Disc levels:  Similar disc degenerative change at C6-C7. Upper chest: Redemonstrated calcified nodule within the right thyroid lobe. No evidence of acute abnormality. Other: Calcific atherosclerosis peer IMPRESSION:  CT head: 1. No evidence of acute intracranial abnormality. 2. Similar chronic microvascular ischemic disease and generalized atrophy 3. Similar nonspecific 1 cm right parotid lesion, which could be benign or malignant. Consider ENT consultation and possibly ENT sampling for further evaluation, if clinically indicated. CT cervical spine: 1. No evidence of acute fracture or traumatic malalignment cervical spine. 2. Similar approximately 2 cm calcified right thyroid lesion. Consider outpatient thyroid ultrasound for further evaluation. Electronically Signed   By: Margaretha Sheffield MD   On: 02/07/2020 15:49    Procedures Procedures (including critical care time)  Medications Ordered in ED Medications  acetaminophen (TYLENOL) tablet 1,000 mg (has no administration in time range)  fentaNYL (SUBLIMAZE) injection 50 mcg (50 mcg Intravenous Given 02/07/20 1319)  ondansetron (ZOFRAN) injection 4 mg (4 mg Intravenous Given 02/07/20 1317)    ED Course  I have reviewed the triage vital signs and the nursing notes.  Pertinent labs & imaging results that were available during my care of the patient were reviewed by me and considered in my medical decision making (see chart for details).    MDM Rules/Calculators/A&P                          Patient presenting to the ED after reported mechanical fall this morning when she was trying to transfer back to her bed.  She has limited mobility at baseline, relies mostly in a wheelchair though does ambulate with a walker.  States she got tangled up with a walker today.  She fell with possible head trauma, had some left-sided  facial pain though resolved prior to evaluation.  She is on anticoagulation for A. fib.  No evidence of head trauma on exam, C-spine collar in place per EMS, unable to clear due to pain in the left elbow.  Neurovascular intact, no focal neuro deficits.  She is alert and oriented and appropriate.  CT imaging of the head and C-spine are negative for acute injury.  Incidental findings regarding parotid and thyroid gland are discussed with patient and recommended for follow-up outpatient.  Left elbow with acute fracture of the lateral epicondyle and coronoid process.  She is placed in sugar tong splint and sling, agreed upon per Ortho PA, provided orthopedic referral for follow-up.  Pain is managed well in the ED.  She remains alert and oriented in no acute distress.  She has caregiver at bedside that is only at her home from 11 AM to 2 PM daily.  She may need additional assistance therefore consult to case management was ordered.  Also like to discuss possible placement at a nursing facility.  Case management to provide resources and discuss options at this time, otherwise patient is appropriate for discharge  Final Clinical Impression(s) / ED Diagnoses Final diagnoses:  Fall in home, initial encounter  Displaced fracture (avulsion) of lateral epicondyle of left humerus, initial encounter for closed fracture  Nondisplaced fracture of coronoid process of left ulna, initial encounter for closed fracture    Rx / DC Orders ED Discharge Orders    None       Carlotta Telfair, [place] N, PA-C 02/07/20 1634    Gareth Morgan, MD 02/10/20 0100

## 2020-02-13 DIAGNOSIS — S42402A Unspecified fracture of lower end of left humerus, initial encounter for closed fracture: Secondary | ICD-10-CM | POA: Insufficient documentation

## 2020-02-13 DIAGNOSIS — K118 Other diseases of salivary glands: Secondary | ICD-10-CM | POA: Insufficient documentation

## 2020-02-15 ENCOUNTER — Telehealth: Payer: Self-pay

## 2020-02-15 NOTE — Telephone Encounter (Signed)
NOTES ON FILE FROM [facility] [phone], SENT REFERRAL TO SCHEDULING °

## 2020-03-16 ENCOUNTER — Other Ambulatory Visit: Payer: Self-pay

## 2020-03-16 ENCOUNTER — Ambulatory Visit: Payer: Medicare PPO | Admitting: Cardiology

## 2020-03-16 ENCOUNTER — Encounter: Payer: Self-pay | Admitting: Cardiology

## 2020-03-16 VITALS — BP 150/87 | HR 75 | Wt 188.6 lb

## 2020-03-16 DIAGNOSIS — D6869 Other thrombophilia: Secondary | ICD-10-CM | POA: Diagnosis not present

## 2020-03-16 DIAGNOSIS — E785 Hyperlipidemia, unspecified: Secondary | ICD-10-CM | POA: Diagnosis not present

## 2020-03-16 DIAGNOSIS — R600 Localized edema: Secondary | ICD-10-CM

## 2020-03-16 DIAGNOSIS — I4821 Permanent atrial fibrillation: Secondary | ICD-10-CM | POA: Diagnosis not present

## 2020-03-16 DIAGNOSIS — I10 Essential (primary) hypertension: Secondary | ICD-10-CM | POA: Diagnosis not present

## 2020-03-16 DIAGNOSIS — Z7901 Long term (current) use of anticoagulants: Secondary | ICD-10-CM

## 2020-03-16 NOTE — Progress Notes (Signed)
Cardiology Office Note:    Date:  03/16/2020   ID:  Diana [name], DOB July 08, 1933, MRN [id number]  PCP:  Christain Sacramento, MD  Cardiologist:  Buford Dresser, MD  Referring MD: Percell Belt, DO   CC: new patient evaluation for history of atrial fibrillation  History of Present Illness:    Diana [name] is a 87 y.o. female with a hx of hypertension, OSA on CPAP, type II diabetes, hyperlipidemia, atrial fibrillation who is seen as a new consult at the request of Lazoff, Shawn P, DO for the evaluation and management of atrial fibrillation. She was last seen in the office by Dr. Claiborne Billings in 11/2016.  Today: Here with a support person. No acute concerns regarding her heart today.   She didn't know that she had afib until recently. However, this is noted on her echo from 12/31/2018, and it is in her hospitalization from that time as well. She was not aware of this. We reviewed atrial fibrillation at length today.  Follows with Dr. Redmond Pulling, most recently seen by Dr. Rayann Heman. Reports that she is now off insulin.  Denies chest pain, shortness of breath at rest or with normal exertion. No PND, orthopnea, LE edema or unexpected weight gain. No syncope or palpitations.  Past Medical History:  Diagnosis Date  . Anemia   . Anemia of decreased vitamin B12 absorption 05/26/2005  . Arthritis   . Breast cancer ([place]) 2001  . Cataract   . History of tobacco abuse   . Hx of colonic polyps   . Hyperlipidemia   . Hypertension   . Hypothyroidism   . Obesity   . OSA on CPAP 09/2007   AHI 10.6/hr overall, 43.64/hr during REM  . Osteopenia   . Renal insufficiency   . Type 2 diabetes mellitus ([place])     Past Surgical History:  Procedure Laterality Date  . BACK SURGERY  05/30/2009  . BIOPSY  01/01/2019   Procedure: BIOPSY;  Surgeon: Milus Banister, MD;  Location: [facility] ENDOSCOPY;  Service: Endoscopy;;  . BOWEL RESECTION  1974  . CATARACT EXTRACTION Bilateral    bilateral  .  CHOLECYSTECTOMY  1974  . ESOPHAGOGASTRODUODENOSCOPY N/A 01/01/2019   Procedure: ESOPHAGOGASTRODUODENOSCOPY (EGD);  Surgeon: Milus Banister, MD;  Location: [facility] ENDOSCOPY;  Service: Endoscopy;  Laterality: N/A;  . MASTECTOMY Bilateral 2001   bilateral sentinel lymph nodes bio  . NM MYOCAR PERF WALL MOTION  06/2007   dipyridamole; perfusion defect in inferior myocardium consistent with diaphragmatic attenuation, remaining myocardium with no ischemia/infarct, EF 73%; normal, low risk scan   . TOTAL ABDOMINAL HYSTERECTOMY  1975  . TRANSTHORACIC ECHOCARDIOGRAM  02/2010   WJ=>19%, stage 1 diastolic dysfunction; borderline RV enlargement; LA mild-mod dilated; mild mitral annular calcif & mild MR; mild TR with normal RSVP, AV moderately sclerotics    Current Medications: Current Outpatient Medications on File Prior to Visit  Medication Sig  . apixaban (ELIQUIS) 2.5 MG TABS tablet Take 1 tablet (2.5 mg total) by mouth 2 (two) times daily.  Marland Kitchen Apoaequorin (PREVAGEN PO) Take 1 capsule by mouth daily.  . brimonidine-timolol (COMBIGAN) 0.2-0.5 % ophthalmic solution Place 1 drop into both eyes every 12 (twelve) hours.  . feeding supplement, ENSURE ENLIVE, (ENSURE ENLIVE) LIQD Take 237 mLs by mouth 2 (two) times daily between meals.  . ferrous sulfate 325 (65 FE) MG tablet Take 1 tablet (325 mg total) by mouth 2 (two) times daily with a meal.  . FLUoxetine (PROZAC) 20 MG capsule  Take 1 capsule (20 mg total) by mouth daily.  Marland Kitchen levothyroxine (SYNTHROID) 50 MCG tablet Take 1 tablet (50 mcg total) by mouth daily.  Marland Kitchen losartan (COZAAR) 50 MG tablet Take 1 tablet (50 mg total) by mouth 2 (two) times daily.  . metoprolol tartrate (LOPRESSOR) 25 MG tablet Take 0.5 tablets (12.5 mg total) by mouth 2 (two) times daily.  . Multiple Vitamins-Minerals (PRESERVISION AREDS 2) CAPS Take 2 capsules by mouth daily.  . pantoprazole (PROTONIX) 40 MG tablet Take 1 tablet (40 mg total) by mouth 2 (two) times daily before a meal.   . polyethylene glycol (MIRALAX / GLYCOLAX) 17 g packet Take 17 g by mouth 2 (two) times daily as needed for moderate constipation.  . rosuvastatin (CRESTOR) 20 MG tablet Take 1 tablet (20 mg total) by mouth daily.   No current facility-administered medications on file prior to visit.     Allergies:   Codeine sulfate   Social History   Tobacco Use  . Smoking status: Former Smoker    Years: 13.00    Types: Cigarettes    Quit date: 05/02/2002    Years since quitting: 17.8  . Smokeless tobacco: Never Used  Vaping Use  . Vaping Use: Never used  Substance Use Topics  . Alcohol use: No    Alcohol/week: 0.0 standard drinks  . Drug use: Never    Family History: family history includes Heart attack in her father and mother. There is no history of Colon cancer.  ROS:   Please see the history of present illness.  Additional pertinent ROS: Constitutional: Negative for chills, fever, night sweats, unintentional weight loss  HENT: Negative for ear pain and hearing loss.   Eyes: Negative for loss of vision and eye pain.  Respiratory: Negative for cough, sputum, wheezing.   Cardiovascular: See HPI. Gastrointestinal: Negative for abdominal pain, melena, and hematochezia.  Genitourinary: Negative for dysuria and hematuria.  Musculoskeletal: Left arm in sling, awaiting discussion of possible intervention Skin: Negative for itching and rash.  Neurological: Negative for focal weakness, focal sensory changes and loss of consciousness.  Endo/Heme/Allergies: Does not bruise/bleed easily.     EKGs/Labs/Other Studies Reviewed:    The following studies were reviewed today: Echo 12/31/18 1. Left ventricular ejection fraction, by visual estimation, is 60 to  65%. The left ventricle has normal function. Normal left ventricular size.  There is no left ventricular hypertrophy.  2. Left ventricular diastolic function could not be evaluated pattern of  LV diastolic filling.  3. Global right  ventricle has normal systolic function.The right  ventricular size is not well visualized. No increase in right ventricular  wall thickness.  4. Left atrial size was normal.  5. Right atrial size was not well visualized.  6. Moderate mitral annular calcification.  7. The mitral valve was not well visualized. Trace mitral valve  regurgitation. No evidence of mitral stenosis.  8. The tricuspid valve is normal in structure. Tricuspid valve  regurgitation was not visualized by color flow Doppler.  9. The aortic valve is normal in structure. Aortic valve regurgitation  was not visualized by color flow Doppler. Structurally normal aortic  valve, with no evidence of sclerosis or stenosis.  10. The pulmonic valve was not well visualized. Pulmonic valve  regurgitation is not visualized by color flow Doppler.  11. TR signal is inadequate for assessing pulmonary artery systolic  pressure.   EKG:  EKG is personally reviewed.  The ekg ordered today demonstrates atrial fibrillation at 75 bpm  Recent Labs: 02/07/2020: ALT 13; BUN 25; Creatinine, Ser 1.36; Hemoglobin 14.2; Platelets 223; Potassium 3.3; Sodium 141  Recent Lipid Panel    Component Value Date/Time   CHOL  07/05/2009 1005    115        ATP III CLASSIFICATION:  <200     mg/dL   Desirable  200-239  mg/dL   Borderline High  >=240    mg/dL   High          TRIG 108 07/05/2009 1005   HDL 42 07/05/2009 1005   CHOLHDL 2.7 07/05/2009 1005   VLDL 22 07/05/2009 1005   LDLCALC  07/05/2009 1005    51        Total Cholesterol/HDL:CHD Risk Coronary Heart Disease Risk Table                     Men   Women  1/2 Average Risk   3.4   3.3  Average Risk       5.0   4.4  2 X Average Risk   9.6   7.1  3 X Average Risk  23.4   11.0        Use the calculated Patient Ratio above and the CHD Risk Table to determine the patient's CHD Risk.        ATP III CLASSIFICATION (LDL):  <100     mg/dL   Optimal  100-129  mg/dL   Near or Above                     Optimal  130-159  mg/dL   Borderline  160-189  mg/dL   High  >190     mg/dL   Very High    Physical Exam:    VS:  BP (!) 150/87   Pulse 75   Wt 188 lb 9.6 oz (85.5 kg)   SpO2 99%   BMI 29.54 kg/m     Wt Readings from Last 3 Encounters:  03/16/20 188 lb 9.6 oz (85.5 kg)  02/07/20 198 lb (89.8 kg)  02/16/19 193 lb 3.2 oz (87.6 kg)    GEN: Well nourished, well developed in no acute distress HEENT: Normal, moist mucous membranes NECK: No JVD CARDIAC: irregularly irregular rhythm, normal S1 and S2, no rubs or gallops. 2/6 systolic murmur. VASCULAR: Radial pulses 2+ bilaterally. RESPIRATORY:  Clear to auscultation without rales, wheezing or rhonchi  ABDOMEN: Soft, non-tender, non-distended MUSCULOSKELETAL: in wheelchair, left arm in sling, swollen compared to right arm SKIN: Warm and dry, trivial bilateral LE edema NEUROLOGIC:  Alert and oriented x 3. No focal neuro deficits noted. PSYCHIATRIC:  Normal affect    ASSESSMENT:    1. Permanent atrial fibrillation ([place])   2. Primary hypertension   3. Hyperlipidemia, unspecified hyperlipidemia type   4. Secondary hypercoagulable state ([place])   5. Current use of long term anticoagulation   6. Bilateral leg edema    PLAN:    Atrial fibrillation, permanent -we discussed atrial fibrillation at length today -she is completely asymptomatic -aim for rate control with metoprolol -normal EF on echo -tolerating apixaban 2.5 mg BID without bleeding. However, she meets only the age criteria for dose reduction. Based on weight, renal function (that I can see), she should be on the 5 mg BID dosing. She reports just seeing her PCP, but I cannot see recent labs. If Cr >1.5 should remain on 2.5 mg BID dose, otherwise would recommend increasing to 5 mg BID dosing.  She is otherwise not receiving adequate stroke protection. Her Cr during admission and initial start of apixaban was 1.58, hence the initial lower dose, but the labs I can see  show improved Cr. -I will forward this note to her PCP office so that they can monitor her Cr -CHA2DS2/VAS Stroke Risk Points= 6   Hypertension: -on losartan and metoprolol -elevated today -follows with Dr. Rachel Bo, believes that it was recently well controlled. I cannot see recent BP. She plans to follow up with her PCP.  Dyslipidemia: -on rosuvastatin -noted history of diabetes per chart, but last A1c 5.2 on no diabetic agents  Mild LE edema, skin discoloration -suggestive of venous insufficiency -counseled on mild compression, elevation  Plan for follow up: we will follow up annually for her atrial fibrillation or sooner if needed  Buford Dresser, MD, PhD, [facility]    Medication Adjustments/Labs and Tests Ordered: Current medicines are reviewed at length with the patient today.  Concerns regarding medicines are outlined above.  Orders Placed This Encounter  Procedures  . EKG 12-Lead   No orders of the defined types were placed in this encounter.   Patient Instructions  Medication Instructions:  Your Physician recommend you continue on your current medication as directed.    *If you need a refill on your cardiac medications before your next appointment, please call your pharmacy*   Lab Work: None  Testing/Procedures: None   Follow-Up: At [facility], you and your health needs are our priority.  As part of our continuing mission to provide you with exceptional heart care, we have created designated Provider Care Teams.  These Care Teams include your primary Cardiologist (physician) and Advanced Practice Providers (APPs -  Physician Assistants and Nurse Practitioners) who all work together to provide you with the care you need, when you need it.  We recommend signing up for the patient portal called "MyChart".  Sign up information is provided on this After Visit Summary.  MyChart is used to connect with patients for Virtual  Visits (Telemedicine).  Patients are able to view lab/test results, encounter notes, upcoming appointments, etc.  Non-urgent messages can be sent to your provider as well.   To learn more about what you can do with MyChart, go to [email or web address].    Your next appointment:   1 year(s)  The format for your next appointment:   In Person  Provider:   Buford Dresser, MD   Other Instructions Look for light to medium compressions socks, knee high. They are sold online or at stores (like Dr. Education officer, environmental at [place]). They don't need to be so tight that they hurt. Take them off when you go to bed.   Signed, Buford Dresser, MD PhD 03/16/2020 7:05 PM    [place]

## 2020-03-16 NOTE — Patient Instructions (Addendum)
Medication Instructions:  Your Physician recommend you continue on your current medication as directed.    *If you need a refill on your cardiac medications before your next appointment, please call your pharmacy*   Lab Work: None  Testing/Procedures: None   Follow-Up: At [facility], you and your health needs are our priority.  As part of our continuing mission to provide you with exceptional heart care, we have created designated Provider Care Teams.  These Care Teams include your primary Cardiologist (physician) and Advanced Practice Providers (APPs -  Physician Assistants and Nurse Practitioners) who all work together to provide you with the care you need, when you need it.  We recommend signing up for the patient portal called "MyChart".  Sign up information is provided on this After Visit Summary.  MyChart is used to connect with patients for Virtual Visits (Telemedicine).  Patients are able to view lab/test results, encounter notes, upcoming appointments, etc.  Non-urgent messages can be sent to your provider as well.   To learn more about what you can do with MyChart, go to [email or web address].    Your next appointment:   1 year(s)  The format for your next appointment:   In Person  Provider:   Buford Dresser, MD   Other Instructions Look for light to medium compressions socks, knee high. They are sold online or at stores (like Dr. Education officer, environmental at [place]). They don't need to be so tight that they hurt. Take them off when you go to bed.

## 2020-03-19 ENCOUNTER — Ambulatory Visit: Payer: Self-pay | Admitting: Student

## 2020-03-19 ENCOUNTER — Other Ambulatory Visit: Payer: Self-pay

## 2020-03-19 ENCOUNTER — Encounter (HOSPITAL_COMMUNITY): Payer: Self-pay | Admitting: Student

## 2020-03-19 DIAGNOSIS — S53105A Unspecified dislocation of left ulnohumeral joint, initial encounter: Secondary | ICD-10-CM

## 2020-03-19 NOTE — Progress Notes (Signed)
Spoke with [place] cell [phone] and  Caregiver [name] cell [phone] for PAT information.  PCP - Dr Kathryne Eriksson Cardiologist - Dr Buford Dresser  Chest x-ray - n/a EKG - 03/16/20 Stress Test - 06/2007 ECHO - 12/31/18 Cardiac Cath - n/a  Sleep Study -  Yes. 12/2011 CPAP - does not uses cpap   Fasting Blood Sugar - unknown Checks Blood Sugar 1 times a day  . Do not take evening dose of glimepiride on Tuesday night.  . Do not take glimepiride and metformin on the morning of surgery (Wed).  . If your blood sugar is less than 70 mg/dL, you will need to treat for low blood sugar: o Treat a low blood sugar (less than 70 mg/dL) with  cup of clear juice (cranberry or apple), 4 glucose tablets, OR glucose gel. o Recheck blood sugar in 15 minutes after treatment (to make sure it is greater than 70 mg/dL). If your blood sugar is not greater than 70 mg/dL on recheck, call [phone] for further instructions.  Blood Thinner Instructions:  Last dose of eliquis was on 03/17/20.         Anesthesia review: YES  STOP now taking any Aspirin (unless otherwise instructed by your surgeon), Aleve, Naproxen, Ibuprofen, Motrin, Advil, Goody's, BC's, all herbal medications, fish oil, and all vitamins.   Coronavirus Screening Covid test is scheduled on  Do you have any of the following symptoms:  Cough yes/no: No Fever (>100.34F)  yes/no: No Runny nose yes/no: No Sore throat yes/no: No Difficulty breathing/shortness of breath  yes/no: No  Have you traveled in the last 14 days and where? yes/no: No  Ann/Sheila verbalized understanding of instructions that were given via phone.

## 2020-03-19 NOTE — Progress Notes (Signed)
Patient is schedule to undergo closed vs open reduction of left elbow dislocation on 03/22/19. For preoperative instructions please contact patients healthcare POA Lelon Frohlich [place] [phone]) and her caretaker Adela Lank [phone]).  Shona Needles, MD [facility] [phone] (office) [email or web address]

## 2020-03-20 ENCOUNTER — Other Ambulatory Visit (HOSPITAL_COMMUNITY)
Admission: RE | Admit: 2020-03-20 | Discharge: 2020-03-20 | Disposition: A | Discharge status: Home / Self Care | Payer: Medicare PPO | Source: Ambulatory Visit | Attending: Student | Admitting: Student

## 2020-03-20 ENCOUNTER — Encounter (HOSPITAL_COMMUNITY): Payer: Self-pay | Admitting: Student

## 2020-03-20 DIAGNOSIS — Z01812 Encounter for preprocedural laboratory examination: Secondary | ICD-10-CM | POA: Diagnosis present

## 2020-03-20 DIAGNOSIS — Z20822 Contact with and (suspected) exposure to covid-19: Secondary | ICD-10-CM | POA: Diagnosis not present

## 2020-03-20 LAB — SARS CORONAVIRUS 2 (TAT 6-24 HRS): SARS Coronavirus 2: NEGATIVE

## 2020-03-21 ENCOUNTER — Encounter (HOSPITAL_COMMUNITY): Payer: Self-pay | Admitting: Student

## 2020-03-21 ENCOUNTER — Ambulatory Visit (HOSPITAL_COMMUNITY): Payer: Medicare PPO | Admitting: Physician Assistant

## 2020-03-21 ENCOUNTER — Observation Stay (HOSPITAL_COMMUNITY): Payer: Medicare PPO

## 2020-03-21 ENCOUNTER — Other Ambulatory Visit: Payer: Self-pay

## 2020-03-21 ENCOUNTER — Observation Stay (HOSPITAL_COMMUNITY)
Admission: RE | Admit: 2020-03-21 | Discharge: 2020-03-28 | Disposition: A | Discharge status: Home / Self Care | Payer: Medicare PPO | Attending: Student | Admitting: Student

## 2020-03-21 ENCOUNTER — Ambulatory Visit (HOSPITAL_COMMUNITY): Payer: Medicare PPO

## 2020-03-21 ENCOUNTER — Encounter (HOSPITAL_COMMUNITY)
Admission: RE | Disposition: A | Discharge status: Home / Self Care | Payer: Self-pay | Source: Home / Self Care | Attending: Student

## 2020-03-21 DIAGNOSIS — S53125A Posterior dislocation of left ulnohumeral joint, initial encounter: Secondary | ICD-10-CM | POA: Diagnosis not present

## 2020-03-21 DIAGNOSIS — X58XXXA Exposure to other specified factors, initial encounter: Secondary | ICD-10-CM | POA: Diagnosis not present

## 2020-03-21 DIAGNOSIS — S53135A Medial dislocation of left ulnohumeral joint, initial encounter: Secondary | ICD-10-CM | POA: Insufficient documentation

## 2020-03-21 DIAGNOSIS — E119 Type 2 diabetes mellitus without complications: Secondary | ICD-10-CM | POA: Diagnosis not present

## 2020-03-21 DIAGNOSIS — T1490XA Injury, unspecified, initial encounter: Secondary | ICD-10-CM

## 2020-03-21 DIAGNOSIS — R2681 Unsteadiness on feet: Secondary | ICD-10-CM | POA: Insufficient documentation

## 2020-03-21 DIAGNOSIS — S42432A Displaced fracture (avulsion) of lateral epicondyle of left humerus, initial encounter for closed fracture: Secondary | ICD-10-CM | POA: Diagnosis not present

## 2020-03-21 DIAGNOSIS — T148XXA Other injury of unspecified body region, initial encounter: Secondary | ICD-10-CM

## 2020-03-21 DIAGNOSIS — Z20822 Contact with and (suspected) exposure to covid-19: Secondary | ICD-10-CM | POA: Insufficient documentation

## 2020-03-21 DIAGNOSIS — S53105A Unspecified dislocation of left ulnohumeral joint, initial encounter: Secondary | ICD-10-CM

## 2020-03-21 DIAGNOSIS — Z794 Long term (current) use of insulin: Secondary | ICD-10-CM | POA: Diagnosis not present

## 2020-03-21 HISTORY — DX: Gastro-esophageal reflux disease without esophagitis: K21.9

## 2020-03-21 HISTORY — DX: Unspecified atrial fibrillation: I48.91

## 2020-03-21 HISTORY — PX: ORIF ELBOW FRACTURE: SHX5031

## 2020-03-21 HISTORY — DX: Other amnesia: R41.3

## 2020-03-21 LAB — BASIC METABOLIC PANEL
Anion gap: 11 (ref 5–15)
BUN: 17 mg/dL (ref 8–23)
CO2: 26 mmol/L (ref 22–32)
Calcium: 8.8 mg/dL — ABNORMAL LOW (ref 8.9–10.3)
Chloride: 101 mmol/L (ref 98–111)
Creatinine, Ser: 1.28 mg/dL — ABNORMAL HIGH (ref 0.44–1.00)
GFR, Estimated: 41 mL/min — ABNORMAL LOW (ref >60)
Glucose, Bld: 178 mg/dL — ABNORMAL HIGH (ref 70–99)
Potassium: 4 mmol/L (ref 3.5–5.1)
Sodium: 138 mmol/L (ref 135–145)

## 2020-03-21 LAB — GLUCOSE, CAPILLARY
Glucose-Capillary: 178 mg/dL — ABNORMAL HIGH (ref 70–99)
Glucose-Capillary: 133 mg/dL — ABNORMAL HIGH (ref 70–99)
Glucose-Capillary: 114 mg/dL — ABNORMAL HIGH (ref 70–99)
Glucose-Capillary: 150 mg/dL — ABNORMAL HIGH (ref 70–99)
Glucose-Capillary: 116 mg/dL — ABNORMAL HIGH (ref 70–99)

## 2020-03-21 LAB — CBC
HCT: 42.9 % (ref 36.0–46.0)
Hemoglobin: 13.4 g/dL (ref 12.0–15.0)
MCH: 30 pg (ref 26.0–34.0)
MCHC: 31.2 g/dL (ref 30.0–36.0)
MCV: 96 fL (ref 80.0–100.0)
Platelets: 245 10*3/uL (ref 150–400)
RBC: 4.47 MIL/uL (ref 3.87–5.11)
RDW: 13.6 % (ref 11.5–15.5)
WBC: 5.5 10*3/uL (ref 4.0–10.5)
nRBC: 0 % (ref 0.0–0.2)

## 2020-03-21 LAB — VITAMIN D 25 HYDROXY (VIT D DEFICIENCY, FRACTURES): Vit D, 25-Hydroxy: 31.07 ng/mL (ref 30–100)

## 2020-03-21 LAB — HEMOGLOBIN A1C
Hgb A1c MFr Bld: 6.1 % — ABNORMAL HIGH (ref 4.8–5.6)
Mean Plasma Glucose: 128.37 mg/dL

## 2020-03-21 SURGERY — OPEN REDUCTION INTERNAL FIXATION (ORIF) ELBOW/OLECRANON FRACTURE
Anesthesia: Monitor Anesthesia Care | Site: Elbow | Laterality: Left

## 2020-03-21 MED ORDER — DOCUSATE SODIUM 100 MG PO CAPS
100.0000 mg | ORAL_CAPSULE | Freq: Two times a day (BID) | ORAL | Status: DC
  Administered 2020-03-21 – 2020-03-28 (×14): 100 mg via ORAL
  Filled 2020-03-21 (×14): qty 1

## 2020-03-21 MED ORDER — VANCOMYCIN HCL 1000 MG IV SOLR
INTRAVENOUS | Status: DC | PRN
  Administered 2020-03-21: 1000 mg

## 2020-03-21 MED ORDER — 0.9 % SODIUM CHLORIDE (POUR BTL) OPTIME
TOPICAL | Status: DC | PRN
  Administered 2020-03-21: 1000 mL

## 2020-03-21 MED ORDER — HYDROCODONE-ACETAMINOPHEN 5-325 MG PO TABS
1.0000 | ORAL_TABLET | ORAL | Status: DC | PRN
  Administered 2020-03-21 – 2020-03-28 (×22): 1 via ORAL
  Filled 2020-03-21 (×8): qty 1
  Filled 2020-03-21: qty 2
  Filled 2020-03-21 (×9): qty 1
  Filled 2020-03-21: qty 2
  Filled 2020-03-21 (×3): qty 1
  Filled 2020-03-21: qty 2

## 2020-03-21 MED ORDER — PHENYLEPHRINE HCL-NACL 10-0.9 MG/250ML-% IV SOLN
INTRAVENOUS | Status: DC | PRN
  Administered 2020-03-21: 30 ug/min via INTRAVENOUS

## 2020-03-21 MED ORDER — FENTANYL CITRATE (PF) 100 MCG/2ML IJ SOLN
INTRAMUSCULAR | Status: AC
  Administered 2020-03-21: 50 ug via INTRAVENOUS
  Filled 2020-03-21: qty 2

## 2020-03-21 MED ORDER — ACETAMINOPHEN 500 MG PO TABS
1000.0000 mg | ORAL_TABLET | Freq: Once | ORAL | Status: AC
  Administered 2020-03-21: 1000 mg via ORAL
  Filled 2020-03-21: qty 2

## 2020-03-21 MED ORDER — METOCLOPRAMIDE HCL 5 MG/ML IJ SOLN: 5.0000 mg | Freq: Three times a day (TID) | INTRAMUSCULAR | Status: DC | PRN

## 2020-03-21 MED ORDER — ONDANSETRON HCL 4 MG PO TABS: 4.0000 mg | ORAL_TABLET | Freq: Four times a day (QID) | ORAL | Status: DC | PRN

## 2020-03-21 MED ORDER — PROPOFOL 1000 MG/100ML IV EMUL
INTRAVENOUS | Status: AC
  Filled 2020-03-21: qty 100

## 2020-03-21 MED ORDER — ROPIVACAINE HCL 5 MG/ML IJ SOLN
INTRAMUSCULAR | Status: DC | PRN
  Administered 2020-03-21: 30 mL via PERINEURAL

## 2020-03-21 MED ORDER — BRIMONIDINE TARTRATE 0.2 % OP SOLN
1.0000 [drp] | Freq: Every day | OPHTHALMIC | Status: DC
  Administered 2020-03-22 – 2020-03-28 (×7): 1 [drp] via OPHTHALMIC
  Filled 2020-03-21: qty 5

## 2020-03-21 MED ORDER — CEFAZOLIN SODIUM-DEXTROSE 2-4 GM/100ML-% IV SOLN
2.0000 g | INTRAVENOUS | Status: AC
  Administered 2020-03-21: 2 g via INTRAVENOUS
  Filled 2020-03-21: qty 100

## 2020-03-21 MED ORDER — ONDANSETRON HCL 4 MG/2ML IJ SOLN: 4.0000 mg | Freq: Four times a day (QID) | INTRAMUSCULAR | Status: DC | PRN

## 2020-03-21 MED ORDER — KETAMINE HCL 50 MG/5ML IJ SOSY
PREFILLED_SYRINGE | INTRAMUSCULAR | Status: AC
  Filled 2020-03-21: qty 5

## 2020-03-21 MED ORDER — LEVOTHYROXINE SODIUM 50 MCG PO TABS
50.0000 ug | ORAL_TABLET | Freq: Every day | ORAL | Status: DC
  Administered 2020-03-22 – 2020-03-28 (×7): 50 ug via ORAL
  Filled 2020-03-21: qty 1
  Filled 2020-03-21 (×2): qty 2
  Filled 2020-03-21: qty 1
  Filled 2020-03-21 (×2): qty 2
  Filled 2020-03-21 (×3): qty 1
  Filled 2020-03-21: qty 2
  Filled 2020-03-21 (×2): qty 1
  Filled 2020-03-21: qty 2
  Filled 2020-03-21: qty 1
  Filled 2020-03-21: qty 2

## 2020-03-21 MED ORDER — ONDANSETRON HCL 4 MG/2ML IJ SOLN: 4.0000 mg | Freq: Once | INTRAMUSCULAR | Status: DC | PRN

## 2020-03-21 MED ORDER — POLYETHYLENE GLYCOL 3350 17 G PO PACK
17.0000 g | PACK | Freq: Every day | ORAL | Status: DC | PRN
  Administered 2020-03-24: 17 g via ORAL
  Filled 2020-03-21: qty 1

## 2020-03-21 MED ORDER — POTASSIUM CHLORIDE IN NACL 20-0.9 MEQ/L-% IV SOLN: INTRAVENOUS | Status: DC

## 2020-03-21 MED ORDER — INSULIN ASPART 100 UNIT/ML ~~LOC~~ SOLN
0.0000 [IU] | Freq: Three times a day (TID) | SUBCUTANEOUS | Status: DC
  Administered 2020-03-21 – 2020-03-23 (×4): 2 [IU] via SUBCUTANEOUS
  Administered 2020-03-23 (×2): 3 [IU] via SUBCUTANEOUS
  Administered 2020-03-24 (×3): 2 [IU] via SUBCUTANEOUS
  Administered 2020-03-25: 5 [IU] via SUBCUTANEOUS
  Administered 2020-03-25 – 2020-03-26 (×2): 2 [IU] via SUBCUTANEOUS
  Administered 2020-03-26: 5 [IU] via SUBCUTANEOUS
  Administered 2020-03-27: 3 [IU] via SUBCUTANEOUS
  Administered 2020-03-27 (×2): 2 [IU] via SUBCUTANEOUS
  Administered 2020-03-28 (×2): 3 [IU] via SUBCUTANEOUS

## 2020-03-21 MED ORDER — CEFAZOLIN SODIUM-DEXTROSE 2-4 GM/100ML-% IV SOLN
2.0000 g | Freq: Three times a day (TID) | INTRAVENOUS | Status: AC
  Administered 2020-03-21 – 2020-03-22 (×3): 2 g via INTRAVENOUS
  Filled 2020-03-21 (×3): qty 100

## 2020-03-21 MED ORDER — PROPOFOL 500 MG/50ML IV EMUL
INTRAVENOUS | Status: AC
  Filled 2020-03-21: qty 50

## 2020-03-21 MED ORDER — ONDANSETRON HCL 4 MG/2ML IJ SOLN
INTRAMUSCULAR | Status: DC | PRN
  Administered 2020-03-21: 4 mg via INTRAVENOUS

## 2020-03-21 MED ORDER — FLUOXETINE HCL 20 MG PO CAPS
20.0000 mg | ORAL_CAPSULE | Freq: Every day | ORAL | Status: DC
  Administered 2020-03-22 – 2020-03-28 (×7): 20 mg via ORAL
  Filled 2020-03-21 (×7): qty 1

## 2020-03-21 MED ORDER — BRIMONIDINE TARTRATE-TIMOLOL 0.2-0.5 % OP SOLN: 1.0000 [drp] | Freq: Every day | OPHTHALMIC | Status: DC

## 2020-03-21 MED ORDER — METOPROLOL TARTRATE 12.5 MG HALF TABLET
12.5000 mg | ORAL_TABLET | Freq: Two times a day (BID) | ORAL | Status: DC
  Administered 2020-03-21 – 2020-03-28 (×13): 12.5 mg via ORAL
  Filled 2020-03-21 (×13): qty 1

## 2020-03-21 MED ORDER — METFORMIN HCL ER 500 MG PO TB24
500.0000 mg | ORAL_TABLET | Freq: Every day | ORAL | Status: DC
  Administered 2020-03-22 – 2020-03-28 (×7): 500 mg via ORAL
  Filled 2020-03-21 (×7): qty 1

## 2020-03-21 MED ORDER — ACETAMINOPHEN 325 MG PO TABS
325.0000 mg | ORAL_TABLET | Freq: Four times a day (QID) | ORAL | Status: DC | PRN
Start: 2020-03-22 — End: 2020-03-28
  Administered 2020-03-24: 650 mg via ORAL
  Filled 2020-03-21: qty 2

## 2020-03-21 MED ORDER — PANTOPRAZOLE SODIUM 40 MG PO TBEC
40.0000 mg | DELAYED_RELEASE_TABLET | Freq: Two times a day (BID) | ORAL | Status: DC
  Administered 2020-03-21 – 2020-03-28 (×14): 40 mg via ORAL
  Filled 2020-03-21 (×14): qty 1

## 2020-03-21 MED ORDER — HYDRALAZINE HCL 20 MG/ML IJ SOLN
INTRAMUSCULAR | Status: AC
  Filled 2020-03-21: qty 1

## 2020-03-21 MED ORDER — PHENYLEPHRINE 40 MCG/ML (10ML) SYRINGE FOR IV PUSH (FOR BLOOD PRESSURE SUPPORT)
PREFILLED_SYRINGE | INTRAVENOUS | Status: DC | PRN
  Administered 2020-03-21 (×4): 80 ug via INTRAVENOUS

## 2020-03-21 MED ORDER — METHOCARBAMOL 1000 MG/10ML IJ SOLN
500.0000 mg | Freq: Four times a day (QID) | INTRAVENOUS | Status: DC | PRN
  Filled 2020-03-21: qty 5

## 2020-03-21 MED ORDER — OXYCODONE HCL 5 MG PO TABS: 5.0000 mg | ORAL_TABLET | ORAL | Status: DC | PRN

## 2020-03-21 MED ORDER — EPHEDRINE SULFATE-NACL 50-0.9 MG/10ML-% IV SOSY
PREFILLED_SYRINGE | INTRAVENOUS | Status: DC | PRN
  Administered 2020-03-21: 20 mg via INTRAVENOUS

## 2020-03-21 MED ORDER — FERROUS SULFATE 325 (65 FE) MG PO TABS
325.0000 mg | ORAL_TABLET | Freq: Every day | ORAL | Status: DC
  Administered 2020-03-22 – 2020-03-28 (×7): 325 mg via ORAL
  Filled 2020-03-21 (×7): qty 1

## 2020-03-21 MED ORDER — FENTANYL CITRATE (PF) 100 MCG/2ML IJ SOLN: 25.0000 ug | INTRAMUSCULAR | Status: DC | PRN

## 2020-03-21 MED ORDER — FENTANYL CITRATE (PF) 100 MCG/2ML IJ SOLN: 50.0000 ug | Freq: Once | INTRAMUSCULAR | Status: AC

## 2020-03-21 MED ORDER — HYDRALAZINE HCL 20 MG/ML IJ SOLN
INTRAMUSCULAR | Status: DC | PRN
  Administered 2020-03-21: 5 mg via INTRAVENOUS

## 2020-03-21 MED ORDER — METHOCARBAMOL 500 MG PO TABS
500.0000 mg | ORAL_TABLET | Freq: Four times a day (QID) | ORAL | Status: DC | PRN
  Administered 2020-03-21 – 2020-03-27 (×12): 500 mg via ORAL
  Filled 2020-03-21 (×12): qty 1

## 2020-03-21 MED ORDER — ORAL CARE MOUTH RINSE: 15.0000 mL | Freq: Once | OROMUCOSAL | Status: AC

## 2020-03-21 MED ORDER — CHLORHEXIDINE GLUCONATE 0.12 % MT SOLN
15.0000 mL | Freq: Once | OROMUCOSAL | Status: AC
  Administered 2020-03-21: 15 mL via OROMUCOSAL
  Filled 2020-03-21: qty 15

## 2020-03-21 MED ORDER — LOSARTAN POTASSIUM 50 MG PO TABS
50.0000 mg | ORAL_TABLET | Freq: Every day | ORAL | Status: DC
Start: 2020-03-22 — End: 2020-03-28
  Administered 2020-03-22 – 2020-03-28 (×6): 50 mg via ORAL
  Filled 2020-03-21 (×7): qty 1

## 2020-03-21 MED ORDER — ROSUVASTATIN CALCIUM 5 MG PO TABS
5.0000 mg | ORAL_TABLET | Freq: Every day | ORAL | Status: DC
  Administered 2020-03-21 – 2020-03-28 (×8): 5 mg via ORAL
  Filled 2020-03-21 (×8): qty 1

## 2020-03-21 MED ORDER — METOCLOPRAMIDE HCL 5 MG PO TABS: 5.0000 mg | ORAL_TABLET | Freq: Three times a day (TID) | ORAL | Status: DC | PRN

## 2020-03-21 MED ORDER — LACTATED RINGERS IV SOLN: INTRAVENOUS | Status: DC

## 2020-03-21 MED ORDER — MORPHINE SULFATE (PF) 2 MG/ML IV SOLN: 0.5000 mg | INTRAVENOUS | Status: DC | PRN

## 2020-03-21 MED ORDER — TIMOLOL MALEATE 0.5 % OP SOLN
1.0000 [drp] | Freq: Every day | OPHTHALMIC | Status: DC
  Administered 2020-03-22 – 2020-03-28 (×7): 1 [drp] via OPHTHALMIC
  Filled 2020-03-21: qty 5

## 2020-03-21 MED ORDER — VANCOMYCIN HCL 1000 MG IV SOLR
INTRAVENOUS | Status: AC
  Filled 2020-03-21: qty 1000

## 2020-03-21 MED ORDER — PROPOFOL 500 MG/50ML IV EMUL
INTRAVENOUS | Status: DC | PRN
  Administered 2020-03-21: 75 ug/kg/min via INTRAVENOUS

## 2020-03-21 MED ORDER — ONDANSETRON HCL 4 MG/2ML IJ SOLN
INTRAMUSCULAR | Status: AC
  Filled 2020-03-21: qty 2

## 2020-03-21 SURGICAL SUPPLY — 88 items
ANCH SUT 2 2.9 2 LD TPR NDL (Anchor) ×1 IMPLANT
ANCHOR JUGGERKNOT WTAP NDL 2.9 (Anchor) ×2 IMPLANT
APL PRP STRL LF DISP 70% ISPRP (MISCELLANEOUS) ×1
APL SKNCLS STERI-STRIP NONHPOA (GAUZE/BANDAGES/DRESSINGS)
BAR EXFX 200X11 NS LF (EXFIX) ×1
BAR GLASS FIBER EXFX 11X200 (EXFIX) ×2 IMPLANT
BENZOIN TINCTURE PRP APPL 2/3 (GAUZE/BANDAGES/DRESSINGS) IMPLANT
BIT DRILL 3.2 XTRAFIX BLUE (BIT) ×2 IMPLANT
BIT DRILL CANN MED FLUTE 4.0 (BIT) ×1 IMPLANT
BIT DRILL JUGRKNT W/NDL BIT2.9 (DRILL) ×1 IMPLANT
BLADE AVERAGE 25X9 (BLADE) IMPLANT
BLADE CLIPPER SURG (BLADE) ×2 IMPLANT
BLADE SURG 10 STRL SS (BLADE) IMPLANT
BNDG CMPR 9X4 STRL LF SNTH (GAUZE/BANDAGES/DRESSINGS)
BNDG COHESIVE 4X5 TAN STRL (GAUZE/BANDAGES/DRESSINGS) ×2 IMPLANT
BNDG ELASTIC 3X5.8 VLCR STR LF (GAUZE/BANDAGES/DRESSINGS) ×2 IMPLANT
BNDG ELASTIC 4X5.8 VLCR STR LF (GAUZE/BANDAGES/DRESSINGS) ×2 IMPLANT
BNDG ELASTIC 6X5.8 VLCR STR LF (GAUZE/BANDAGES/DRESSINGS) ×2 IMPLANT
BNDG ESMARK 4X9 LF (GAUZE/BANDAGES/DRESSINGS) IMPLANT
BNDG GAUZE ELAST 4 BULKY (GAUZE/BANDAGES/DRESSINGS) ×2 IMPLANT
BRUSH SCRUB EZ PLAIN DRY (MISCELLANEOUS) ×2 IMPLANT
CHLORAPREP W/TINT 26 (MISCELLANEOUS) ×2 IMPLANT
CLAMP PIN 45MM 1 BAR (EXFIX) ×4 IMPLANT
CLEANER TIP ELECTROSURG 2X2 (MISCELLANEOUS) ×2 IMPLANT
COVER SURGICAL LIGHT HANDLE (MISCELLANEOUS) ×4 IMPLANT
COVER WAND RF STERILE (DRAPES) ×2 IMPLANT
CUFF TOURN SGL QUICK 18X4 (TOURNIQUET CUFF) IMPLANT
CUFF TOURN SGL QUICK 24 (TOURNIQUET CUFF)
CUFF TRNQT CYL 24X4X16.5-23 (TOURNIQUET CUFF) IMPLANT
DECANTER SPIKE VIAL GLASS SM (MISCELLANEOUS) IMPLANT
DRAPE C-ARM 42X72 X-RAY (DRAPES) ×2 IMPLANT
DRAPE C-ARMOR (DRAPES) ×2 IMPLANT
DRAPE INCISE IOBAN 66X45 STRL (DRAPES) IMPLANT
DRAPE ORTHO SPLIT 77X108 STRL (DRAPES) ×4
DRAPE SURG ORHT 6 SPLT 77X108 (DRAPES) ×2 IMPLANT
DRAPE U-SHAPE 47X51 STRL (DRAPES) ×2 IMPLANT
DRILL CANN 4.0MM (BIT) ×2
DRILL JUGGERKNOT W/NDL BIT 2.9 (DRILL) ×2
DRSG ADAPTIC 3X8 NADH LF (GAUZE/BANDAGES/DRESSINGS) IMPLANT
DRSG EMULSION OIL 3X3 NADH (GAUZE/BANDAGES/DRESSINGS) ×2 IMPLANT
ELECT REM PT RETURN 9FT ADLT (ELECTROSURGICAL) ×2
ELECTRODE REM PT RTRN 9FT ADLT (ELECTROSURGICAL) ×1 IMPLANT
GAUZE SPONGE 4X4 12PLY STRL (GAUZE/BANDAGES/DRESSINGS) ×2 IMPLANT
GAUZE SPONGE 4X4 12PLY STRL LF (GAUZE/BANDAGES/DRESSINGS) ×2 IMPLANT
GAUZE XEROFORM 5X9 LF (GAUZE/BANDAGES/DRESSINGS) ×2 IMPLANT
GLOVE BIO SURGEON STRL SZ 6.5 (GLOVE) ×6 IMPLANT
GLOVE BIO SURGEON STRL SZ7.5 (GLOVE) ×8 IMPLANT
GLOVE BIOGEL PI IND STRL 6.5 (GLOVE) ×1 IMPLANT
GLOVE BIOGEL PI IND STRL 7.5 (GLOVE) ×1 IMPLANT
GLOVE BIOGEL PI INDICATOR 6.5 (GLOVE) ×1
GLOVE BIOGEL PI INDICATOR 7.5 (GLOVE) ×1
GOWN STRL REUS W/ TWL LRG LVL3 (GOWN DISPOSABLE) ×2 IMPLANT
GOWN STRL REUS W/TWL LRG LVL3 (GOWN DISPOSABLE) ×4
KIT BASIN OR (CUSTOM PROCEDURE TRAY) ×2 IMPLANT
KIT TURNOVER KIT B (KITS) ×2 IMPLANT
MANIFOLD NEPTUNE II (INSTRUMENTS) ×2 IMPLANT
NS IRRIG 1000ML POUR BTL (IV SOLUTION) ×2 IMPLANT
PACK ORTHO EXTREMITY (CUSTOM PROCEDURE TRAY) ×2 IMPLANT
PAD ARMBOARD 7.5X6 YLW CONV (MISCELLANEOUS) ×4 IMPLANT
PAD CAST 3X4 CTTN HI CHSV (CAST SUPPLIES) ×1 IMPLANT
PAD CAST 4YDX4 CTTN HI CHSV (CAST SUPPLIES) ×1 IMPLANT
PADDING CAST COTTON 3X4 STRL (CAST SUPPLIES) ×2
PADDING CAST COTTON 4X4 STRL (CAST SUPPLIES) ×2
PIN 4X100X20MM EXFIX LG BLUNT (EXFIX) ×4 IMPLANT
PIN HALF 5X160X35 BLUNT TIP (EXFIX) ×4 IMPLANT
SPONGE LAP 18X18 RF (DISPOSABLE) ×4 IMPLANT
STAPLER VISISTAT 35W (STAPLE) ×2 IMPLANT
STRIP CLOSURE SKIN 1/2X4 (GAUZE/BANDAGES/DRESSINGS) IMPLANT
SUCTION FRAZIER HANDLE 10FR (MISCELLANEOUS) ×2
SUCTION TUBE FRAZIER 10FR DISP (MISCELLANEOUS) ×1 IMPLANT
SUT ETHILON 2 0 PSLX (SUTURE) ×2 IMPLANT
SUT MNCRL AB 3-0 PS2 18 (SUTURE) ×2 IMPLANT
SUT MON AB 2-0 CT1 36 (SUTURE) ×2 IMPLANT
SUT PDS AB 2-0 CT1 27 (SUTURE) IMPLANT
SUT PROLENE 0 CT (SUTURE) IMPLANT
SUT PROLENE 3 0 PS 2 (SUTURE) IMPLANT
SUT VIC AB 0 CT1 27 (SUTURE)
SUT VIC AB 0 CT1 27XBRD ANBCTR (SUTURE) IMPLANT
SUT VIC AB 2-0 CT1 27 (SUTURE)
SUT VIC AB 2-0 CT1 TAPERPNT 27 (SUTURE) IMPLANT
SUT VIC AB 2-0 CT3 27 (SUTURE) IMPLANT
SYR CONTROL 10ML LL (SYRINGE) ×2 IMPLANT
TOWEL GREEN STERILE (TOWEL DISPOSABLE) ×4 IMPLANT
TOWEL GREEN STERILE FF (TOWEL DISPOSABLE) IMPLANT
TUBE CONNECTING 12X1/4 (SUCTIONS) ×2 IMPLANT
UNDERPAD 30X36 HEAVY ABSORB (UNDERPADS AND DIAPERS) ×2 IMPLANT
WATER STERILE IRR 1000ML POUR (IV SOLUTION) ×2 IMPLANT
YANKAUER SUCT BULB TIP NO VENT (SUCTIONS) ×2 IMPLANT

## 2020-03-21 NOTE — Evaluation (Signed)
Occupational Therapy Evaluation Patient Details Name: Diana [name] MRN: [id number] DOB: September 16, 1933 Today's Date: 03/21/2020    History of Present Illness 87 year old female who injured her arm in early December had a fracture of the left lateral epicondyle.  She was placed in a long-arm cast, and unfortunately she presented with a dislocated elbow and somehow removed the cast on her own.  Underwent Open reduction and internal fixation of left elbow fracture dislocation and addition of external fixator.  She is NWB through the L upper extremity.   Clinical Impression   Patient admitted for the above diagnosis and procedure.  Deficits are listed below, which is complicated by decreased cognition.  PTA she had a PCA that assisted her in the mornings, and then again at night with all her care and mobility. She had [place] [facility] working with her, and was walking with a 2WRW.  It sounds as if she probably needed 24 hour care.  Given her deficits and cognitive dysfunction, she really needs 24 hour care, and would benefit from a short rehab stay at SNF to ensure compliance with NWB to her L arm.  OT will continue to follow her while she is here in the acute care setting.      Follow Up Recommendations  SNF    Equipment Recommendations  3 in 1 bedside commode    Recommendations for Other Services       Precautions / Restrictions Precautions Precautions: Fall Required Braces or Orthoses: Other Brace;Sling Other Brace: L elbow external fixator Restrictions Weight Bearing Restrictions: Yes LUE Weight Bearing: Non weight bearing      Mobility Bed Mobility Overal bed mobility: Needs Assistance Bed Mobility: Supine to Sit;Sit to Supine     Supine to sit: Min guard Sit to supine: Min guard   General bed mobility comments: patient moves quickly, and shifts her weight with increased chance of WB through L elbow    Transfers Overall transfer level: Needs assistance   Transfers: Sit to/from  Stand Sit to Stand: Min assist;+2 safety/equipment              Balance Overall balance assessment: Needs assistance Sitting-balance support: Single extremity supported Sitting balance-Leahy Scale: Fair       Standing balance-Leahy Scale: Poor Standing balance comment: HHA provided                           ADL either performed or assessed with clinical judgement   ADL Overall ADL's : Needs assistance/impaired Eating/Feeding: Moderate assistance;Bed level               Upper Body Dressing : Maximal assistance;Sitting   Lower Body Dressing: Total assistance;Sit to/from stand   Toilet Transfer: Minimal assistance;+2 for safety/equipment   Toileting- Clothing Manipulation and Hygiene: Total assistance;Sit to/from stand       Functional mobility during ADLs: Minimal assistance;+2 for safety/equipment General ADL Comments: guarding L arm with block in place.     Vision Patient Visual Report: No change from baseline       Perception     Praxis      Pertinent Vitals/Pain Pain Assessment: No/denies pain (block in place)     Hand Dominance Right   Extremity/Trunk Assessment Upper Extremity Assessment Upper Extremity Assessment: LUE deficits/detail LUE Deficits / Details: external fixator in place.  block in place with no AROM or sensation.   Lower Extremity Assessment Lower Extremity Assessment: Defer to PT evaluation   Cervical /  Trunk Assessment Cervical / Trunk Assessment: Kyphotic   Communication Communication Communication: No difficulties   Cognition Arousal/Alertness: Awake/alert Behavior During Therapy: WFL for tasks assessed/performed Overall Cognitive Status: History of cognitive impairments - at baseline                                 General Comments: safety concerns and ST memory deficits noted on eval.   General Comments       Exercises     Shoulder Instructions      Home Living Family/patient  expects to be discharged to:: Private residence Living Arrangements: Alone Available Help at Discharge: Personal care attendant;Available PRN/intermittently Type of Home: House Home Access: Ramped entrance     Home Layout: Two level;Able to live on main level with bedroom/bathroom     Bathroom Shower/Tub: Occupational psychologist: Standard Bathroom Accessibility: No   Home Equipment: Environmental consultant - 2 wheels;Bedside commode;Hand held shower head;Grab bars - tub/shower;Toilet riser;Shower seat;Wheelchair - Banker          Prior Functioning/Environment Level of Independence: Needs assistance  Gait / Transfers Assistance Needed: Patient primarily completes stand pivot transfers to a w/c, and then propels the w/c with her feet. ADL's / Homemaking Assistance Needed: Max A for bathing and dressing from PCA.  Dep for meals, home management, community mobility and meds from her PCA. Communication / Swallowing Assistance Needed: WFLs          OT Problem List: Decreased activity tolerance;Impaired balance (sitting and/or standing);Decreased knowledge of precautions;Decreased safety awareness;Increased edema      OT Treatment/Interventions: Self-care/ADL training;Therapeutic exercise;DME and/or AE instruction;Balance training;Therapeutic activities    OT Goals(Current goals can be found in the care plan section) Acute Rehab OT Goals Patient Stated Goal: Patient states she needs to learn how all this works OT Goal Formulation: With patient Time For Goal Achievement: 04/04/20 Potential to Achieve Goals: Fair ADL Goals Pt Will Perform Grooming: with min assist;sitting Pt Will Perform Upper Body Bathing: with max assist;sitting Pt Will Perform Upper Body Dressing: with max assist;sitting Pt Will Transfer to Toilet: with min assist Pt Will Perform Toileting - Clothing Manipulation and hygiene: with mod assist  OT Frequency: Min 2X/week   Barriers to D/C: Decreased  caregiver support          Co-evaluation PT/OT/SLP Co-Evaluation/Treatment: Yes Reason for Co-Treatment: Complexity of the patient's impairments (multi-system involvement);For patient/therapist safety   OT goals addressed during session: ADL's and self-care      AM-PAC OT "6 Clicks" Daily Activity     Outcome Measure Help from another person eating meals?: A Little Help from another person taking care of personal grooming?: A Little Help from another person toileting, which includes using toliet, bedpan, or urinal?: A Lot Help from another person bathing (including washing, rinsing, drying)?: A Lot Help from another person to put on and taking off regular upper body clothing?: A Lot Help from another person to put on and taking off regular lower body clothing?: A Lot 6 Click Score: 14   End of Session Equipment Utilized During Treatment: Gait belt Nurse Communication: Mobility status  Activity Tolerance: Patient tolerated treatment well Patient left: in bed;with call bell/phone within reach;with nursing/sitter in room  OT Visit Diagnosis: Unsteadiness on feet (R26.81)                Time: 6269-4854 OT Time Calculation (min): 24 min Charges:  OT General  Charges $OT Visit: 1 Visit OT Evaluation $OT Eval Moderate Complexity: 1 Mod  03/21/2020  Rich, OTR/L  Acute Rehabilitation Services  Office:  [place] 03/21/2020, 4:30 PM

## 2020-03-21 NOTE — Anesthesia Preprocedure Evaluation (Addendum)
Anesthesia Evaluation  Patient identified by MRN, date of birth, ID band Patient awake    Reviewed: Allergy & Precautions, NPO status , Patient's Chart, lab work & pertinent test results, reviewed documented beta blocker date and time   Airway Mallampati: II  TM Distance: >3 FB Neck ROM: Full    Dental no notable dental hx. (+) Teeth Intact, Dental Advisory Given   Pulmonary sleep apnea and Continuous Positive Airway Pressure Ventilation , former smoker,    Pulmonary exam normal breath sounds clear to auscultation       Cardiovascular hypertension, Pt. on medications and Pt. on home beta blockers Normal cardiovascular exam+ dysrhythmias (eliquis) Atrial Fibrillation  Rhythm:Regular Rate:Normal  Last echo 2020: 1. Left ventricular ejection fraction, by visual estimation, is 60 to 65%. The left ventricle has normal function. Normal left ventricular size. There is no left ventricular hypertrophy.  2. Left ventricular diastolic function could not be evaluated pattern of LV diastolic filling.  3. Global right ventricle has normal systolic function.The right  ventricular size is not well visualized. No increase in right ventricular  wall thickness.  4. Left atrial size was normal.  5. Right atrial size was not well visualized.  6. Moderate mitral annular calcification.  7. The mitral valve was not well visualized. Trace mitral valve regurgitation. No evidence of mitral stenosis.  8. The tricuspid valve is normal in structure. Tricuspid valve regurgitation was not visualized by color flow Doppler.  9. The aortic valve is normal in structure. Aortic valve regurgitation was not visualized by color flow Doppler. Structurally normal aortic valve, with no evidence of sclerosis or stenosis.  10. The pulmonic valve was not well visualized. Pulmonic valve regurgitation is not visualized by color flow Doppler.  11. TR signal is inadequate for  assessing pulmonary artery systolic pressure.    Neuro/Psych PSYCHIATRIC DISORDERS Depression negative neurological ROS     GI/Hepatic Neg liver ROS, hiatal hernia, PUD, GERD  Controlled,  Endo/Other  diabetes, Well Controlled, Type 2, Oral Hypoglycemic AgentsHypothyroidism Last a1c 5.2  Renal/GU Renal InsufficiencyRenal diseaseLast Cr 1.36  negative genitourinary   Musculoskeletal  (+) Arthritis , Osteoarthritis,  Left chronic elbow dislocation   Abdominal   Peds  Hematology  (+) Blood dyscrasia, anemia ,   Anesthesia Other Findings   Reproductive/Obstetrics negative OB ROS                            Anesthesia Physical Anesthesia Plan  ASA: III  Anesthesia Plan: Regional and MAC   Post-op Pain Management:  Regional for Post-op pain   Induction:   PONV Risk Score and Plan: 2 and Treatment may vary due to age or medical condition, Propofol infusion and TIVA  Airway Management Planned: LMA  Additional Equipment: None  Intra-op Plan:   Post-operative Plan:   Informed Consent: I have reviewed the patients History and Physical, chart, labs and discussed the procedure including the risks, benefits and alternatives for the proposed anesthesia with the patient or authorized representative who has indicated his/her understanding and acceptance.     Dental advisory given  Plan Discussed with: CRNA  Anesthesia Plan Comments: (Patient wishes to avoid GA  Originally slightly hypertensive to 170s SBP, climbing to 200s SBP after nerve block. Did not take losartan today as per instructions. Will treat BP intraoperatively if necessary )      Anesthesia Quick Evaluation

## 2020-03-21 NOTE — Transfer of Care (Signed)
Immediate Anesthesia Transfer of Care Note  Patient: Diana [name]  Procedure(s) Performed: OPEN REDUCTION INTERNAL FIXATION (ORIF) ELBOW/OLECRANON FRACTURE (Left Elbow)  Patient Location: PACU  Anesthesia Type:MAC and Regional  Level of Consciousness: awake, alert  and patient cooperative  Airway & Oxygen Therapy: Patient Spontanous Breathing  Post-op Assessment: Report given to RN, Post -op Vital signs reviewed and stable and Patient moving all extremities X 4  Post vital signs: Reviewed and stable  Last Vitals:  Vitals Value Taken Time  BP 133/76 03/21/20 1327  Temp    Pulse 68 03/21/20 1329  Resp 22 03/21/20 1329  SpO2 97 % 03/21/20 1329  Vitals shown include unvalidated device data.  Last Pain:  Vitals:   03/21/20 0807  TempSrc: Temporal         Complications: No complications documented.

## 2020-03-21 NOTE — Anesthesia Procedure Notes (Signed)
Anesthesia Regional Block: Supraclavicular block   Pre-Anesthetic Checklist: ,, timeout performed, Correct Patient, Correct Site, Correct Laterality, Correct Procedure, Correct Position, site marked, Risks and benefits discussed,  Surgical consent,  Pre-op evaluation,  At surgeon's request and post-op pain management  Laterality: Left  Prep: Maximum Sterile Barrier Precautions used, chloraprep       Needles:  Injection technique: Single-shot  Needle Type: Echogenic Stimulator Needle     Needle Length: 9cm  Needle Gauge: 22     Additional Needles:   Procedures:,,,, ultrasound used (permanent image in chart),,,,  Narrative:  Start time: 03/21/2020 9:30 AM End time: 03/21/2020 9:38 AM Injection made incrementally with aspirations every 5 mL.  Performed by: Personally  Anesthesiologist: Pervis Hocking, DO  Additional Notes: Monitors applied. No increased pain on injection. No increased resistance to injection. Injection made in 5cc increments. Good needle visualization. Patient tolerated procedure well.

## 2020-03-21 NOTE — Progress Notes (Signed)
Dr. Doroteo Glassman aware of patient's elevated BP

## 2020-03-21 NOTE — Interval H&P Note (Signed)
History and Physical Interval Note:  03/21/2020 10:30 AM  Diana [name]  has presented today for surgery, with the diagnosis of Left chronic elbow dislocation.  The various methods of treatment have been discussed with the patient and family. After consideration of risks, benefits and other options for treatment, the patient has consented to  Procedure(s): OPEN REDUCTION INTERNAL FIXATION (ORIF) ELBOW/OLECRANON FRACTURE (Left) as a surgical intervention.  The patient's history has been reviewed, patient examined, no change in status, stable for surgery.  I have reviewed the patient's chart and labs.  Questions were answered to the patient's satisfaction.     Lennette Bihari P Fionna Merriott

## 2020-03-21 NOTE — Progress Notes (Signed)
03/21/20 1639  PT Evaluation Information  Last PT Received On 03/21/20  Assistance Needed +1 (+2 helpful for mobility progression)  PT/OT/SLP Co-Evaluation/Treatment Yes  Reason for Co-Treatment Complexity of the patient's impairments (multi-system involvement);For patient/therapist safety  PT goals addressed during session Mobility/safety with mobility;Balance  History of Present Illness 87 year old female who injured her arm in early December had a fracture of the left lateral epicondyle.  She was placed in a long-arm cast, and unfortunately she presented with a dislocated elbow and somehow removed the cast on her own.  Underwent Open reduction and internal fixation of left elbow fracture dislocation and addition of external fixator.  She is NWB through the L upper extremity.  Precautions  Precautions Fall  Precaution Comments Multiple falls per chart; L elbow external fixator  Restrictions  Weight Bearing Restrictions Yes  LUE Weight Bearing NWB  Home Living  Family/patient expects to be discharged to: Private residence  Living Arrangements Alone  Available Help at Discharge Personal care attendant;Available PRN/intermittently  Type of [place] entrance  Home Layout Two level;Able to live on main level with bedroom/bathroom  Engineer, manufacturing systems No  Home Equipment Walker - 2 wheels;BSC;Hand held shower head;Grab bars - tub/shower;Toilet riser;Shower seat;Wheelchair - Banker  Prior Function  Level of Independence Needs assistance  Gait / Transfers Assistance Needed Patient primarily completes stand pivot transfers to a w/c, and then propels the w/c with her feet. Per caregiver, sometimes gets up when no one is there and ambulates to bathroom.  ADL's / Homemaking Assistance Needed Max A for bathing and dressing from PCA.  Dep for meals, home management, community mobility and meds  from her PCA.  Communication  Communication No difficulties  Pain Assessment  Pain Assessment No/denies pain (block in place)  Cognition  Arousal/Alertness Awake/alert  Behavior During Therapy WFL for tasks assessed/performed  Overall Cognitive Status History of cognitive impairments - at baseline  General Comments safety concerns and ST memory deficits noted on eval.  Upper Extremity Assessment  Upper Extremity Assessment Defer to OT evaluation  LUE Deficits / Details external fixator in place.  block in place with no AROM or sensation.  Lower Extremity Assessment  Lower Extremity Assessment Generalized weakness;LLE deficits/detail  LLE Deficits / Details Pt reporting tingling in LLE. Reports that is new  Cervical / Trunk Assessment  Cervical / Trunk Assessment Kyphotic  Bed Mobility  Overal bed mobility Needs Assistance  Bed Mobility Supine to Sit;Sit to Supine  Supine to sit Min guard  Sit to supine Min guard  General bed mobility comments Min guard for safety and to maintain precautions  Transfers  Overall transfer level Needs assistance  Equipment used 1 person hand held assist  Transfers Sit to/from Stand  Sit to Stand Min assist;+2 safety/equipment  General transfer comment Min A for lift assist and steadying to stand. OT on L side to support LUE.  Ambulation/Gait  Ambulation/Gait assistance Min assist;Mod assist;+2 safety/equipment  Assistive device 1 person hand held assist  General Gait Details Min A to light mod for steadying assist to take steps at EOB. Unsteady throughout.  Balance  Overall balance assessment Needs assistance  Sitting-balance support Single extremity supported  Sitting balance-Leahy Scale Fair  Standing balance support Single extremity supported  Standing balance-Leahy Scale Poor  Standing balance comment Reliant on HHA and external support  General Comments  General comments (skin integrity, edema, etc.) Educated about need for  24/7 and safety  concerns with mobility at home.  PT - End of Session  Equipment Utilized During Treatment Gait belt  Activity Tolerance Patient tolerated treatment well  Patient left in bed;with call bell/phone within reach;with nursing/sitter in room  Nurse Communication Mobility status  PT Assessment  PT Recommendation/Assessment Patient needs continued PT services  PT Visit Diagnosis Unsteadiness on feet (R26.81);Muscle weakness (generalized) (M62.81);Repeated falls (R29.6);History of falling (Z91.81)  PT Problem List Decreased strength;Decreased activity tolerance;Decreased balance;Decreased mobility;Decreased cognition;Decreased knowledge of use of DME;Decreased safety awareness;Decreased knowledge of precautions  Barriers to Discharge Decreased caregiver support  PT Plan  PT Frequency (ACUTE ONLY) Min 3X/week  PT Treatment/Interventions (ACUTE ONLY) DME instruction;Gait training;Functional mobility training;Therapeutic activities;Therapeutic exercise;Balance training;Patient/family education;Wheelchair mobility training;Cognitive remediation  AM-PAC PT "6 Clicks" Mobility Outcome Measure (Version 2)  Help needed turning from your back to your side while in a flat bed without using bedrails? 3  Help needed moving from lying on your back to sitting on the side of a flat bed without using bedrails? 3  Help needed moving to and from a bed to a chair (including a wheelchair)? 3  Help needed standing up from a chair using your arms (e.g., wheelchair or bedside chair)? 3  Help needed to walk in hospital room? 2  Help needed climbing 3-5 steps with a railing?  1  6 Click Score 15  Consider Recommendation of Discharge To: CIR/SNF/LTACH  PT Recommendation  Follow Up Recommendations SNF;Supervision/Assistance - 24 hour  PT equipment None recommended by PT  Individuals Consulted  Consulted and Agree with Results and Recommendations Patient;Family member/caregiver  Family Member Consulted caregiver  Acute  Rehab PT Goals  Patient Stated Goal Patient states she needs to learn how all this works  PT Goal Formulation With patient  Time For Goal Achievement 04/04/20  Potential to Achieve Goals Fair  PT Time Calculation  PT Start Time (ACUTE ONLY) 1553  PT Stop Time (ACUTE ONLY) 1611  PT Time Calculation (min) (ACUTE ONLY) 18 min  PT General Charges  $$ ACUTE PT VISIT 1 Visit  PT Evaluation  $PT Eval Moderate Complexity 1 Mod    Pt presenting secondary to problem above with deficits above. Pt with poor safety awareness and memory deficits noted. Requiring min to light mod A +2 for safety to take steps at EOB. Pt currently has caregiver, but does not have 24/7 support. Feel pt will require 24/7 support at d/c given high fall risk and safety concerns with mobility. Recommending SNF level therapies at d/c. Will continue to follow acutely.   Reuel Derby, PT, DPT  Acute Rehabilitation Services  Pager: [phone] Office: [phone]

## 2020-03-21 NOTE — Anesthesia Postprocedure Evaluation (Signed)
Anesthesia Post Note  Patient: Diana [name]  Procedure(s) Performed: OPEN REDUCTION INTERNAL FIXATION (ORIF) ELBOW/OLECRANON FRACTURE (Left Elbow)     Patient location during evaluation: PACU Anesthesia Type: Regional and MAC Level of consciousness: awake and alert Pain management: pain level controlled Vital Signs Assessment: post-procedure vital signs reviewed and stable Respiratory status: spontaneous breathing, nonlabored ventilation and respiratory function stable Cardiovascular status: blood pressure returned to baseline and stable Postop Assessment: no apparent nausea or vomiting Anesthetic complications: no   No complications documented.  Last Vitals:  Vitals:   03/21/20 1415 03/21/20 1448  BP: (!) 155/91 (!) 168/93  Pulse: 67 68  Resp: (!) 23 18  Temp: (!) 36.1 C (!) 36.4 C  SpO2: 95% 98%    Last Pain:  Vitals:   03/21/20 1448  TempSrc: Oral  PainSc:                  Pervis Hocking

## 2020-03-21 NOTE — Op Note (Signed)
Orthopaedic Surgery Operative Note (CSN: [id number] ) Date of Surgery: 03/21/2020  Admit Date: 03/21/2020   Diagnoses: Pre-Op Diagnoses: Left elbow fracture dislocation  Post-Op Diagnosis: Same  Procedures: 1. CPT 24586-Open reduction and internal fixation of left elbow fracture dislocation 2. CPT 20690-External fixation of left elbow   Surgeons : Primary: Navdeep Fessenden, Thomasene Lot, MD  Assistant: Patrecia Pace, PA-C  Location: OR 7   Anesthesia:Regional  Antibiotics: Ancef 2g preop with 1 gm vancomycin powder placed topically   Tourniquet time:None  Estimated Blood CVEL:38 mL  Complications:None   Specimens:None   Implants: 1. Zimmer-Biomet Juggerknot suture anchor for lateral epicondylar repair 2. Zimmer-Biomet Xtra-fix external fixation for left elbow.  Indications for Surgery: 87 year old female who injured her arm in early December had a fracture of the left lateral epicondyle.  She was placed in a long-arm cast by Dr. Grandville Silos and unfortunately she presented to his office last week with dislocated.  I saw her earlier in the week and I recommended proceeding to operating room for closed versus open reduction with possible repair and possible external fixation.  I discussed this with the patient and her healthcare power of attorney.  Risk and benefits were discussed including risk of bleeding, infection, malunion, nonunion, hardware failure, hardware irritation, nerve and blood vessel injury, DVT, elbow instability, elbow stiffness, heterotopic ossification, even the possibility anesthetic complications.  They agreed to proceed with surgery and consent was obtained.  Operative Findings: 1.  Unsuccessful closed reduction of left elbow with need for open reduction using lateral approach to the elbow 2.  Repair of lateral epicondyle fracture using Zimmer [employer]omet Juggerknot suture anchor 3.  Placement of static external fixator with 2 pins in the humerus and 2 pins in the ulna using the  [employer]immer [employer] Xtra-fix  Procedure: The patient was identified in the preoperative holding area. Consent was confirmed with the patient and their family and all questions were answered. The operative extremity was marked after confirmation with the patient. she was then brought back to the operating room by our anesthesia colleagues.  She was carefully transferred over to radiolucent flat top table.  Her arm was positioned on a extremity table.  She had received a block in was under light sedation.  An attempt was made for closed reduction under fluoroscopy.  I was unable to get a concentric reduction and I felt that open reduction was required.  The left upper extremity was then prepped and draped in usual sterile fashion.  A timeout was performed to verify the patient, the procedure, and the extremity.  Preoperative antibiotics were dosed.  Fluoroscopic imaging was obtained.  A lateral approach to the elbow was carried down through skin and subcutaneous tissue.  Kocher interval was developed and the joint was exposed.  I proceeded to remove the soft tissue in the ulnohumeral joint.  There was a fragment from the lateral epicondyle that still had the lateral ulnar collateral ligament attached.  I then performed a reduction maneuver after cleaning out the soft tissue.  I was able to get a concentric reduction of the ulnohumeral joint and radiocapitellar joint.  I used fluoroscopy to confirm this.  I then used a juggernaut suture anchor drill to drill in the bed of the fracture of the lateral epicondyle.  I then proceeded to through the sutures through the fragment of the lateral epicondyle and the lateral ulnar collateral ligament as well.  I then confirmed anatomic reduction of the elbow joint and then I tied the sutures down  to repair the fracture and the fragment.  Once I had the fragment repaired I then took the elbow through a gentle range of motion.  At about 40 degrees of extension she subluxated and  potentially dislocated.  At this point I felt that the best option would be a static external fixator to allow her to scar in appropriate position.  Measured approximately 10 cm above the lateral epicondyle made an incision proximal to this to prevent injury to the radial nerve.  I bluntly dissected down to bone laterally and then proceeded to drill and placed a 5.0 mm threaded half pins.  The process was repeated proximal to this.  I then made the same incisions along the lateral aspect of the ulna and drilled bicortically through the ulna and placed a 4.0 mm threaded half pins.  Clamps were placed to the pins.  I then connected a 11 mm bar to the pin clamps.  Final tightened and confirmed concentric reduction with fluoroscopy.  The incision was then copiously irrigated.  A gram of vancomycin powder was placed to the incision.  The fascial incision was closed with 0 Vicryl suture.  The skin was closed with 2-0 Vicryl and 3-0 nylon.  Sterile dressing was placed.  The pin sites were wrapped with a Kerlix.  The patient was then awoken from the sedation and taken to the PACU in stable condition.  Post Op Plan/Instructions: Patient be nonweightbearing to left upper extremity.  She will be admitted for observation for physical therapy.  We will have her mobilize with PT and OT.  I would recommend aspirin for DVT prophylaxis and postoperative Ancef.  I was present and performed the entire surgery.  Patrecia Pace, PA-C did assist me throughout the case. An assistant was necessary given the difficulty in approach, maintenance of reduction and ability to instrument the fracture.   Katha Hamming, MD [facility]

## 2020-03-21 NOTE — Progress Notes (Signed)
Patient has a history of memory loss.  Patient oriented x4 at the time consent was signed.  Patient and caregiver, Standley Brooking, verified that patient signs her own consent forms.  Patient confirmed that HCPOA, sister in law Ulyses Jarred knows patient is having surgery today.

## 2020-03-21 NOTE — H&P (Signed)
Orthopaedic Trauma Service (OTS) Consult   Patient ID: Diana [name] MRN: [id number] DOB/AGE: 85/03/1933 85 y.o.  Reason for Surgery: Left elbow dislocation  HPI: Diana [name] is an 87 y.o. female 87 year old female who injured her elbow in early December.  She was seen by Dr. Micheline Rough with [facility] orthopedics and subsequently was placed in a long-arm cast.  She had a lateral epicondyle fracture.  She subsequently presented to his office last week at which point x-rays in the cast showed that she had a posterior dislocation.  It is unsure when this occurred.  The cast was cut down she is placed in a sling and referred to myself.  I saw the patient earlier this week and discussed risks and benefits of proceeding with intervention with her healthcare power of attorney and the patient herself.  I discussed the possibility of closed versus open reduction and possible external fixation.  I briefly discussed the possibility of total elbow arthroplasty.  After full discussion we decided on closed reduction attempt with likely open reduction with external fixation.  Risk and benefits were discussed.  We will plan to have the patient admitted for observation after surgery.  Past Medical History:  Diagnosis Date  . Anemia   . Anemia of decreased vitamin B12 absorption 05/26/2005  . Arthritis   . Atrial fibrillation ([place])   . Breast cancer ([place]) 2001  . Cataract   . GERD (gastroesophageal reflux disease)   . History of tobacco abuse   . Hx of colonic polyps   . Hyperlipidemia   . Hypertension   . Hypothyroidism   . Memory loss   . Obesity   . OSA on CPAP 09/2007   AHI 10.6/hr overall, 43.64/hr during REM, lost weight, does not use cpap  . Osteopenia   . Renal insufficiency   . Type 2 diabetes mellitus ([place])     Past Surgical History:  Procedure Laterality Date  . BACK SURGERY  05/30/2009  . BIOPSY  01/01/2019   Procedure: BIOPSY;  Surgeon: Milus Banister, MD;  Location:  [facility] ENDOSCOPY;  Service: Endoscopy;;  . BOWEL RESECTION  1974  . CATARACT EXTRACTION Bilateral    bilateral  . CHOLECYSTECTOMY  1974  . COLONOSCOPY    . ESOPHAGOGASTRODUODENOSCOPY N/A 01/01/2019   Procedure: ESOPHAGOGASTRODUODENOSCOPY (EGD);  Surgeon: Milus Banister, MD;  Location: [facility] ENDOSCOPY;  Service: Endoscopy;  Laterality: N/A;  . MASTECTOMY Bilateral 2001   bilateral sentinel lymph nodes bio  . NM MYOCAR PERF WALL MOTION  06/2007   dipyridamole; perfusion defect in inferior myocardium consistent with diaphragmatic attenuation, remaining myocardium with no ischemia/infarct, EF 73%; normal, low risk scan   . TOTAL ABDOMINAL HYSTERECTOMY  1975  . TRANSTHORACIC ECHOCARDIOGRAM  02/2010   TM=>54%, stage 1 diastolic dysfunction; borderline RV enlargement; LA mild-mod dilated; mild mitral annular calcif & mild MR; mild TR with normal RSVP, AV moderately sclerotics    Family History  Problem Relation Age of Onset  . Heart attack Mother   . Heart attack Father   . Colon cancer Neg Hx     Social History:  reports that she quit smoking about 17 years ago. Her smoking use included cigarettes. She quit after 13.00 years of use. She has never used smokeless tobacco. She reports that she does not drink alcohol and does not use drugs.  Allergies:  Allergies  Allergen Reactions  . Codeine Sulfate Nausea Only    Medications: I have reviewed the patient's current medications.  ROS:  Constitutional: No fever or chills Vision: No changes in vision ENT: No difficulty swallowing CV: No chest pain Pulm: No SOB or wheezing GI: No nausea or vomiting GU: No urgency or inability to hold urine Skin: No poor wound healing Neurologic: No numbness or tingling Psychiatric: No depression or anxiety Heme: No bruising Allergic: No reaction to medications or food   Exam: Blood pressure (!) 172/98, pulse 74, temperature 97.9 F (36.6 C), temperature source Temporal, resp. rate 18, height 5\' 6"   (1.676 m), weight 85.3 kg, SpO2 98 %. General: No acute distress Orientation: Awake alert and oriented x3 Mood and Affect: Cooperative and pleasant Gait: Normal limits Coordination and balance: Within normal limits  Left upper extremity reveals significant swelling through the arm.  Does not tolerate any range of motion of the elbow without significant grimacing in pain.  She has motor and sensory function is intact to all nerve distributions.  She has a warm well-perfused hand with 2+ radial pulse.  Right upper extremity: Skin without lesions. No tenderness to palpation. Full painless ROM, full strength in each muscle groups without evidence of instability.   Medical Decision Making: Data: Imaging: X-rays are reviewed which shows a lateral epicondyle fracture with associated posterior elbow dislocation.  Labs:  Results for orders placed or performed during the hospital encounter of 03/21/20 (from the past 24 hour(s))  Glucose, capillary     Status: Abnormal   Collection Time: 03/21/20  8:07 AM  Result Value Ref Range   Glucose-Capillary 178 (H) 70 - 99 mg/dL     Imaging or Labs ordered: None  Medical history and chart was reviewed and case discussed with medical provider.  Assessment/Plan: 87 year old female with subacute left elbow dislocation.  Discussed at length risks and benefits with her healthcare power of attorney.  We will plan for closed reduction versus open reduction with likely external fixation plus or minus fixation of the fracture and elbow.  Risks include but not limited to bleeding, infection, malunion, nonunion, recurrent instability, elbow stiffness, nerve or blood vessel injury, DVT, or even possible anesthetic complications.  The patient agrees to proceed with surgery as does her healthcare power of attorney.  Shona Needles, MD [facility] [phone] (office) [email or web address]

## 2020-03-22 DIAGNOSIS — S53135A Medial dislocation of left ulnohumeral joint, initial encounter: Secondary | ICD-10-CM | POA: Diagnosis not present

## 2020-03-22 DIAGNOSIS — S42432A Displaced fracture (avulsion) of lateral epicondyle of left humerus, initial encounter for closed fracture: Secondary | ICD-10-CM | POA: Diagnosis not present

## 2020-03-22 LAB — GLUCOSE, CAPILLARY
Glucose-Capillary: 140 mg/dL — ABNORMAL HIGH (ref 70–99)
Glucose-Capillary: 143 mg/dL — ABNORMAL HIGH (ref 70–99)
Glucose-Capillary: 62 mg/dL — ABNORMAL LOW (ref 70–99)
Glucose-Capillary: 75 mg/dL (ref 70–99)
Glucose-Capillary: 156 mg/dL — ABNORMAL HIGH (ref 70–99)

## 2020-03-22 LAB — CBC
HCT: 39.4 % (ref 36.0–46.0)
Hemoglobin: 12.1 g/dL (ref 12.0–15.0)
MCH: 30 pg (ref 26.0–34.0)
MCHC: 30.7 g/dL (ref 30.0–36.0)
MCV: 97.8 fL (ref 80.0–100.0)
Platelets: 206 10*3/uL (ref 150–400)
RBC: 4.03 MIL/uL (ref 3.87–5.11)
RDW: 13.7 % (ref 11.5–15.5)
WBC: 7.1 10*3/uL (ref 4.0–10.5)
nRBC: 0 % (ref 0.0–0.2)

## 2020-03-22 LAB — BASIC METABOLIC PANEL
Anion gap: 14 (ref 5–15)
BUN: 23 mg/dL (ref 8–23)
CO2: 21 mmol/L — ABNORMAL LOW (ref 22–32)
Calcium: 8.6 mg/dL — ABNORMAL LOW (ref 8.9–10.3)
Chloride: 102 mmol/L (ref 98–111)
Creatinine, Ser: 1.44 mg/dL — ABNORMAL HIGH (ref 0.44–1.00)
GFR, Estimated: 36 mL/min — ABNORMAL LOW (ref >60)
Glucose, Bld: 166 mg/dL — ABNORMAL HIGH (ref 70–99)
Potassium: 4.4 mmol/L (ref 3.5–5.1)
Sodium: 137 mmol/L (ref 135–145)

## 2020-03-22 MED ORDER — APIXABAN 2.5 MG PO TABS
2.5000 mg | ORAL_TABLET | Freq: Two times a day (BID) | ORAL | Status: DC
  Administered 2020-03-22 – 2020-03-28 (×13): 2.5 mg via ORAL
  Filled 2020-03-22 (×14): qty 1

## 2020-03-22 NOTE — Care Management Obs Status (Signed)
[place] NOTIFICATION   Patient Details  Name: TEKOA HAMOR MRN: [id number] Date of Birth: [birth year]   Medicare Observation Status Notification Given:  Yes    Angelita Ingles, RN 03/22/2020, 9:41 AM

## 2020-03-22 NOTE — TOC Initial Note (Addendum)
Transition of Care [facility]) - Initial/Assessment Note    Patient Details  Name: Diana [name] MRN: [id number] Date of Birth: 1933-08-27  Transition of Care [facility]) CM/SW Contact:    Angelita Ingles, RN Phone Number: [phone]  03/22/2020, 4:05 PM  Clinical Narrative:                 [facility] consulted for patient needing SNF placement for rehab. Choice offered to patient and patient is agreeable to have info faxed out. List has been provided for the patient. FL2 complete with previous PASRR #. Info has been faxed out for bed offers. Insurance auth initiated with [employer] Ref # [phone] [id number] Clinicals have been faxed to [phone].   Expected Discharge Plan: Skilled Nursing Facility Barriers to Discharge: Continued Medical Work up   Patient Goals and CMS Choice Patient states their goals for this hospitalization and ongoing recovery are:: Want to get better CMS Medicare.gov Compare Post Acute Care list provided to:: Patient Choice offered to / list presented to : Patient  Expected Discharge Plan and Services Expected Discharge Plan: [place] In-house Referral: NA Discharge Planning Services: CM Consult Post Acute Care Choice: [place] Living arrangements for the past 2 months: Single Family Home                 DME Arranged: N/A DME Agency: NA       HH Arranged: NA [place] Agency: NA        Prior Living Arrangements/Services Living arrangements for the past 2 months: Single Family Home Lives with:: Self Patient language and need for interpreter reviewed:: Yes Do you feel safe going back to the place where you live?: Yes      Need for Family Participation in Patient Care: Yes (Comment) Care giver support system in place?: Yes (comment) Current home services: DME Criminal Activity/Legal Involvement Pertinent to Current Situation/Hospitalization: No - Comment as needed  Activities of Daily Living Home Assistive Devices/Equipment: Walker  (specify type),Cane (specify quad or straight),Wheelchair,CBG Meter ADL Screening (condition at time of admission) Patient's cognitive ability adequate to safely complete daily activities?: Yes Is the patient deaf or have difficulty hearing?: No Does the patient have difficulty seeing, even when wearing glasses/contacts?: No Does the patient have difficulty concentrating, remembering, or making decisions?: Yes Patient able to express need for assistance with ADLs?: Yes Does the patient have difficulty dressing or bathing?: Yes Independently performs ADLs?: No Communication: Independent Dressing (OT): Needs assistance Is this a change from baseline?: Pre-admission baseline Grooming: Needs assistance Is this a change from baseline?: Change from baseline, expected to last <3 days Feeding: Needs assistance Is this a change from baseline?: Change from baseline, expected to last <3 days Bathing: Needs assistance Is this a change from baseline?: Pre-admission baseline Toileting: Needs assistance Is this a change from baseline?: Change from baseline, expected to last <3 days In/Out Bed: Needs assistance Is this a change from baseline?: Change from baseline, expected to last <3 days Walks in Home: Needs assistance Is this a change from baseline?: Pre-admission baseline Does the patient have difficulty walking or climbing stairs?: Yes Weakness of Legs: Both Weakness of Arms/Hands: Left  Permission Sought/Granted   Permission granted to share information with : No              Emotional Assessment Appearance:: Appears stated age Attitude/Demeanor/Rapport: Gracious Affect (typically observed): Accepting Orientation: : Oriented to Self,Oriented to Place,Oriented to Situation Alcohol / Substance Use: Not Applicable Psych Involvement: No (comment)  Admission diagnosis:  Closed dislocation of left elbow [S53.105A] Patient Active Problem List   Diagnosis Date Noted  . Closed dislocation  of left elbow 03/19/2020  . Depression, recurrent ([place]) 01/21/2019  . Hyperlipidemia associated with type 2 diabetes mellitus ([place]) 01/21/2019  . Acute metabolic encephalopathy 40/34/7425  . Fall 01/11/2019  . Klebsiella UTI (urinary tract infection) 01/11/2019  . Acute urinary retention 01/11/2019  . Left kidney lesion 01/11/2019  . Ulcerative esophagitis 01/11/2019  . Persistent atrial fibrillation ([place])   . Anemia 01/01/2019  . Hiatal hernia   . Symptomatic anemia 12/31/2018  . Hypoglycemia 12/31/2018  . Ribs, multiple fractures 12/31/2018  . HTN (hypertension) 05/15/2013  . OSA on CPAP 05/15/2013  . Hypothyroidism 05/15/2013  . DM2 (diabetes mellitus, type 2) ([place]) 05/15/2013  . Lower extremity edema 05/15/2013  . Renal insufficiency 05/15/2013  . Morbid obesity ([place]) 05/15/2013  . Breast cancer ([place]) 03/18/2011   PCP:  Christain Sacramento, MD Pharmacy:   [facility] [address], [place] - [address] [place] [address] Phone: [phone] Fax: [phone]     Social Determinants of Health (SDOH) Interventions    Readmission Risk Interventions No flowsheet data found.

## 2020-03-22 NOTE — Progress Notes (Signed)
MEDICATION RELATED CONSULT NOTE - FOLLOW UP   Pharmacy Consult for Fracture Care Post-Operative Anticoagulation  Indication: Apixaban prior to admission for Afib  Patient Measurements: Height: 5\' 6"  (167.6 cm) Weight: 85.3 kg (188 lb) IBW/kg (Calculated) : 59.3   Labs: Recent Labs    03/21/20 0739 03/22/20 0314  WBC 5.5 7.1  HGB 13.4 12.1  HCT 42.9 39.4  PLT 245 206  CREATININE 1.28* 1.44*   Estimated Creatinine Clearance: 32 mL/min (A) (by C-G formula based on SCr of 1.44 mg/dL (H)).    Assessment: 87 yo female presented for ORIF of elbow for chronic dislocation. Patient is on apixaban prior to admission for Afib. Pharmacy consulted to resume prior to admission anticoagulation POD1 if hgb > 9.5. Today, hgb 12.1. No reported bleeding. Prior to admission patient is on reduced dose of apixaban 2.5mg  but only meets criteria for dose reduction based on age (when initiated Scr was > 1.5). Outpatient cardiology on 1/7 recommended increasing to 5mg  if Scr continued to be under 1.5. Today, Scr 1.44.    Plan:  Resume apixaban 2.5mg  twice daily given prior to admission dose and Scr only slightly under 1.5 today Though will continue to monitor Scr trend and if proves baseline Scr is under 1.5 would recommend adjusting dose from 2.5mg  to 5mg    Cristela Felt, PharmD Clinical Pharmacist  03/22/2020,7:08 AM

## 2020-03-22 NOTE — NC FL2 (Addendum)
[place] LEVEL OF CARE SCREENING TOOL     IDENTIFICATION  Patient Name: Diana [name] Birthdate: [birth year] Sex: female Admission Date (Current Location): 03/21/2020  [facility] and [place] Number:  Herbalist and Address:  [facility], [place] [address], [place], [place] [address]      Provider Number: [id number]  Attending Physician Name and Address:  Shona Needles, MD  Relative Name and Phone Number:  Ulyses Jarred [phone]    Current Level of Care: Hospital Recommended Level of Care: [place] Prior Approval Number:    Date Approved/Denied:   PASRR Number: [id number]  Discharge Plan: SNF    Current Diagnoses: Patient Active Problem List   Diagnosis Date Noted  . Closed dislocation of left elbow 03/19/2020  . Depression, recurrent ([place]) 01/21/2019  . Hyperlipidemia associated with type 2 diabetes mellitus ([place]) 01/21/2019  . Acute metabolic encephalopathy 23/53/6144  . Fall 01/11/2019  . Klebsiella UTI (urinary tract infection) 01/11/2019  . Acute urinary retention 01/11/2019  . Left kidney lesion 01/11/2019  . Ulcerative esophagitis 01/11/2019  . Persistent atrial fibrillation ([place])   . Anemia 01/01/2019  . Hiatal hernia   . Symptomatic anemia 12/31/2018  . Hypoglycemia 12/31/2018  . Ribs, multiple fractures 12/31/2018  . HTN (hypertension) 05/15/2013  . OSA on CPAP 05/15/2013  . Hypothyroidism 05/15/2013  . DM2 (diabetes mellitus, type 2) ([place]) 05/15/2013  . Lower extremity edema 05/15/2013  . Renal insufficiency 05/15/2013  . Morbid obesity ([place]) 05/15/2013  . Breast cancer ([place]) 03/18/2011    Orientation RESPIRATION BLADDER Height & Weight     Self,Place  Normal Incontinent (at times) Weight: 85.3 kg Height:  5\' 6"  (167.6 cm)  BEHAVIORAL SYMPTOMS/MOOD NEUROLOGICAL BOWEL NUTRITION STATUS   (n/a)  (n/a) Continent Diet  AMBULATORY STATUS COMMUNICATION OF NEEDS Skin   Limited  Assist Verbally Surgical wounds (surgical incision to left arm)                       Personal Care Assistance Level of Assistance  Bathing,Feeding,Dressing,Total care Bathing Assistance: Limited assistance Feeding assistance: Limited assistance Dressing Assistance: Limited assistance Total Care Assistance: Limited assistance   Functional Limitations Info  Sight,Hearing,Speech Sight Info: Adequate Hearing Info: Adequate Speech Info: Adequate    SPECIAL CARE FACTORS FREQUENCY  PT (By licensed PT),OT (By licensed OT)     PT Frequency: 5X OT Frequency: 5X            Contractures Contractures Info: Not present    Additional Factors Info  Code Status,Allergies,Psychotropic,Insulin Sliding Scale,Isolation Precautions,Suctioning Needs Code Status Info: Full Allergies Info: Codeine ,Sulfate Psychotropic Info: Prozac 20 mg daily Insulin Sliding Scale Info: see d/c summary for sliding scale information Isolation Precautions Info: None Suctioning Needs: n/a   Current Medications (03/22/2020):  This is the current hospital active medication list Current Facility-Administered Medications  Medication Dose Route Frequency Provider Last Rate Last Admin  . 0.9 % NaCl with KCl 20 mEq/ L  infusion   Intravenous Continuous Delray Alt, PA-C      . acetaminophen (TYLENOL) tablet 325-650 mg  325-650 mg Oral Q6H PRN Delray Alt, PA-C      . apixaban Arne Cleveland) tablet 2.5 mg  2.5 mg Oral BID Henri Medal, RPH   2.5 mg at 03/22/20 1202  . brimonidine (ALPHAGAN) 0.2 % ophthalmic solution 1 drop  1 drop Both Eyes Daily Haddix, Thomasene Lot, MD   1 drop at  03/22/20 1020   And  . timolol (TIMOPTIC) 0.5 % ophthalmic solution 1 drop  1 drop Both Eyes Daily Haddix, Thomasene Lot, MD   1 drop at 03/22/20 1017  . docusate sodium (COLACE) capsule 100 mg  100 mg Oral BID Patrecia Pace A, PA-C   100 mg at 03/22/20 1016  . ferrous sulfate tablet 325 mg  325 mg Oral Q breakfast Delray Alt, PA-C   325  mg at 03/22/20 8299  . FLUoxetine (PROZAC) capsule 20 mg  20 mg Oral Daily Haddix, Thomasene Lot, MD   20 mg at 03/22/20 1016  . HYDROcodone-acetaminophen (NORCO/VICODIN) 5-325 MG per tablet 1-2 tablet  1-2 tablet Oral Q4H PRN Delray Alt, PA-C   1 tablet at 03/22/20 1301  . insulin aspart (novoLOG) injection 0-15 Units  0-15 Units Subcutaneous TID WC Delray Alt, PA-C   2 Units at 03/22/20 1203  . levothyroxine (SYNTHROID) tablet 50 mcg  50 mcg Oral Q0600 Shona Needles, MD   50 mcg at 03/22/20 0603  . losartan (COZAAR) tablet 50 mg  50 mg Oral Daily Patrecia Pace A, PA-C   50 mg at 03/22/20 1016  . metFORMIN (GLUCOPHAGE-XR) 24 hr tablet 500 mg  500 mg Oral Q breakfast Patrecia Pace A, PA-C   500 mg at 03/22/20 3716  . methocarbamol (ROBAXIN) tablet 500 mg  500 mg Oral Q6H PRN Delray Alt, PA-C   500 mg at 03/22/20 1301   Or  . methocarbamol (ROBAXIN) 500 mg in dextrose 5 % 50 mL IVPB  500 mg Intravenous Q6H PRN Delray Alt, PA-C      . metoCLOPramide (REGLAN) tablet 5-10 mg  5-10 mg Oral Q8H PRN Patrecia Pace A, PA-C       Or  . metoCLOPramide (REGLAN) injection 5-10 mg  5-10 mg Intravenous Q8H PRN Patrecia Pace A, PA-C      . metoprolol tartrate (LOPRESSOR) tablet 12.5 mg  12.5 mg Oral BID Haddix, Thomasene Lot, MD   12.5 mg at 03/22/20 1016  . morphine 2 MG/ML injection 0.5-1 mg  0.5-1 mg Intravenous Q2H PRN Delray Alt, PA-C      . ondansetron [facility]) tablet 4 mg  4 mg Oral Q6H PRN Delray Alt, PA-C       Or  . ondansetron [facility]) injection 4 mg  4 mg Intravenous Q6H PRN Patrecia Pace A, PA-C      . oxyCODONE (Oxy IR/ROXICODONE) immediate release tablet 5 mg  5 mg Oral Q4H PRN Delray Alt, PA-C      . pantoprazole (PROTONIX) EC tablet 40 mg  40 mg Oral BID AC Patrecia Pace A, PA-C   40 mg at 03/22/20 0834  . polyethylene glycol (MIRALAX / GLYCOLAX) packet 17 g  17 g Oral Daily PRN Patrecia Pace A, PA-C      . rosuvastatin (CRESTOR) tablet 5 mg  5 mg Oral Daily Patrecia Pace A, PA-C   5 mg at 03/22/20 1016     Discharge Medications: Please see discharge summary for a list of discharge medications.  Relevant Imaging Results:  Relevant Lab Results:   Additional Information SS# [id number]  Angelita Ingles, RN

## 2020-03-22 NOTE — Progress Notes (Signed)
Orthopaedic Trauma Progress Note  SUBJECTIVE: Reports mild/moderate pain this morning, primarily located in the forearm around pin sites. No chest pain. No SOB. No nausea/vomiting. No other complaints.  Note that she needs assistance with eating.  I have discussed current therapy recommendations for SNF at discharge with patient, she is in agreement with the plan.  She does state that she has some help at home but this is not 24/7 care so she agrees that she would benefit from continued therapies and more consistent care once leaving the hospital as she does have frequent falls..  OBJECTIVE:  Vitals:   03/22/20 0403 03/22/20 0739  BP: 125/70 134/79  Pulse: 62 61  Resp: 20 17  Temp: 97.9 F (36.6 C) 97.6 F (36.4 C)  SpO2: 99% 100%    General: Resting in bed, no acute distress.  Pleasant and cooperative Respiratory: No increased work of breathing.  Left Upper Extremity: Ex-Fix in place.  Ace wrap and pin sites clean, dry, intact.  Tenderness with palpation throughout the arm.  Notable swelling to the hand and fingers.  Able to wiggle each of her fingers.  Motor sensation light touch distally.  Hand warm and well-perfused.  Neurovascularly intact  IMAGING: Stable post op imaging.   LABS:  Results for orders placed or performed during the hospital encounter of 03/21/20 (from the past 24 hour(s))  Glucose, capillary     Status: Abnormal   Collection Time: 03/21/20  8:07 AM  Result Value Ref Range   Glucose-Capillary 178 (H) 70 - 99 mg/dL  Glucose, capillary     Status: Abnormal   Collection Time: 03/21/20 10:27 AM  Result Value Ref Range   Glucose-Capillary 133 (H) 70 - 99 mg/dL  Glucose, capillary     Status: Abnormal   Collection Time: 03/21/20  1:45 PM  Result Value Ref Range   Glucose-Capillary 114 (H) 70 - 99 mg/dL  Glucose, capillary     Status: Abnormal   Collection Time: 03/21/20  4:41 PM  Result Value Ref Range   Glucose-Capillary 150 (H) 70 - 99 mg/dL  VITAMIN D 25  Hydroxy (Vit-D Deficiency, Fractures)     Status: None   Collection Time: 03/21/20  4:59 PM  Result Value Ref Range   Vit D, 25-Hydroxy 31.07 30 - 100 ng/mL  Hemoglobin A1c     Status: Abnormal   Collection Time: 03/21/20  4:59 PM  Result Value Ref Range   Hgb A1c MFr Bld 6.1 (H) 4.8 - 5.6 %   Mean Plasma Glucose 128.37 mg/dL  Glucose, capillary     Status: Abnormal   Collection Time: 03/21/20  9:06 PM  Result Value Ref Range   Glucose-Capillary 116 (H) 70 - 99 mg/dL  Basic metabolic panel     Status: Abnormal   Collection Time: 03/22/20  3:14 AM  Result Value Ref Range   Sodium 137 135 - 145 mmol/L   Potassium 4.4 3.5 - 5.1 mmol/L   Chloride 102 98 - 111 mmol/L   CO2 21 (L) 22 - 32 mmol/L   Glucose, Bld 166 (H) 70 - 99 mg/dL   BUN 23 8 - 23 mg/dL   Creatinine, Ser 1.44 (H) 0.44 - 1.00 mg/dL   Calcium 8.6 (L) 8.9 - 10.3 mg/dL   GFR, Estimated 36 (L) >60 mL/min   Anion gap 14 5 - 15  CBC     Status: None   Collection Time: 03/22/20  3:14 AM  Result Value Ref Range   WBC  7.1 4.0 - 10.5 K/uL   RBC 4.03 3.87 - 5.11 MIL/uL   Hemoglobin 12.1 12.0 - 15.0 g/dL   HCT 39.4 36.0 - 46.0 %   MCV 97.8 80.0 - 100.0 fL   MCH 30.0 26.0 - 34.0 pg   MCHC 30.7 30.0 - 36.0 g/dL   RDW 13.7 11.5 - 15.5 %   Platelets 206 150 - 400 K/uL   nRBC 0.0 0.0 - 0.2 %  Glucose, capillary     Status: Abnormal   Collection Time: 03/22/20  6:06 AM  Result Value Ref Range   Glucose-Capillary 140 (H) 70 - 99 mg/dL   Comment 1 Notify RN    Comment 2 Document in Chart     ASSESSMENT: MEYGAN KYSER is a 87 y.o. female, 1 Day Post-Op s/p Procedure(s): ORIF LEFT ELBOW/OLECRANON FRACTURE  CV/Blood loss: Acute blood loss anemia, Hgb 12.1 this morning. Hemodynamically stable  PLAN: Weightbearing: NWB LUE Incisional and dressing care: Leave Ace wrap in place.  Begin daily pin site dressing changes 03/23/2020  Showering: Okay to shower with assistance.  Keep the left arm covered when doing  so. Orthopedic device(s): Ex-Fix LUE  Pain management:  1. Tylenol 325-650 mg q 6 hours PRN 2. Robaxin 500 mg q 6 hours PRN 3. Norco 5-325 mg q 4 hours PRN moderate pain 4. Oxycodone 5 mg q 4 hours PRN severe pain 5. Morphine 0.5-1 mg q 2 hours PRN VTE prophylaxis: Restart Eliquis today, SCDs ID:  Ancef 2gm post op Foley/Lines:  No foley, KVO IVFs Impediments to Fracture Healing: Vit D level looks good at 31 Dispo: Therapies as tolerated, PT/OT recommending SNF. TOC team following.  Follow - up plan: TBD  Contact information:  Katha Hamming MD, Patrecia Pace PA-C. After hours and holidays please check [email or web address] for group call information for [facility]   Jalia Zuniga A. Ricci Barker, PA-C [phone] (office) [email or web address]

## 2020-03-22 NOTE — Progress Notes (Signed)
Hypoglycemic Event  CBG: 62  Treatment: 8 oz juice/soda  Symptoms: None  Follow-up CBG: Time:1700 CBG Result:75  Possible Reasons for Event: Unknown  Comments/MD notified:Hypoglycemia protocol    Tom-Johnson, Renea Ee

## 2020-03-22 NOTE — Progress Notes (Signed)
Occupational Therapy Treatment Patient Details Name: Diana [name] MRN: [id number] DOB: 06-06-1933 Today's Date: 03/22/2020    History of present illness 87 year old female who injured her arm in early December had a fracture of the left lateral epicondyle.  She was placed in a long-arm cast, and unfortunately she presented with a dislocated elbow and somehow removed the cast on her own.  Underwent Open reduction and internal fixation of left elbow fracture dislocation and addition of external fixator.  She is NWB through the L upper extremity.   OT comments  Primarily reinforced NWB to L upper extremity through elbow or hand/wrist.  Block has worn off, able to participate well with HEP described below.  Sheet left for compliance with PCA and SNF.  Tried to fit current sling for support, doesn't fit great, but will suffice if she needs it supported during mobility.  Arm positioned to help with edema control.  Continue to recommend SNF, as she needs 24 hour support at home, and needs to learn techniques to make her more independent with self care given decreased use of L upper extremity.    Follow Up Recommendations  SNF    Equipment Recommendations  3 in 1 bedside commode    Recommendations for Other Services      Precautions / Restrictions Precautions Precautions: Fall Precaution Comments: L elbow external fixator Required Braces or Orthoses: Other Brace;Sling Restrictions Weight Bearing Restrictions: Yes LUE Weight Bearing: Non weight bearing       Mobility Bed Mobility Overal bed mobility: Needs Assistance Bed Mobility: Supine to Sit;Sit to Supine     Supine to sit: Min guard Sit to supine: Min guard   General bed mobility comments: Min guard for safety and to maintain precautions  Transfers                      Balance   Sitting-balance support: Single extremity supported Sitting balance-Leahy Scale: Fair     Standing balance support: Single  extremity supported Standing balance-Leahy Scale: Poor Standing balance comment: Reliant on HHA and external support                                                Cognition Arousal/Alertness: Awake/alert Behavior During Therapy: WFL for tasks assessed/performed Overall Cognitive Status: History of cognitive impairments - at baseline                                          Exercises General Exercises - Upper Extremity Shoulder Flexion: AAROM;Left;10 reps Shoulder ABduction: AAROM;Left;10 reps Wrist Flexion: AROM;Left;10 reps Wrist Extension: AROM;Left;10 reps Digit Composite Flexion: AROM;Left;10 reps Composite Extension: AROM;Left;10 reps   Shoulder Instructions       General Comments      Pertinent Vitals/ Pain       Pain Assessment: Faces Faces Pain Scale: Hurts little more Pain Location: L elbow Pain Descriptors / Indicators: Sore Pain Intervention(s): Monitored during session  Frequency  Min 2X/week        Progress Toward Goals  OT Goals(current goals can now be found in the care plan section)  Progress towards OT goals: Progressing toward goals  Acute Rehab OT Goals Patient Stated Goal: asking about SNF today OT Goal Formulation: With patient Time For Goal Achievement: 04/04/20 Potential to Achieve Goals: Good  Plan Discharge plan remains appropriate    Co-evaluation                 AM-PAC OT "6 Clicks" Daily Activity     Outcome Measure   Help from another person eating meals?: A Little Help from another person taking care of personal grooming?: A Little Help from another person toileting, which includes using toliet, bedpan, or urinal?: A Lot Help from another person bathing (including washing, rinsing, drying)?: A Lot Help from another person to put on and taking off regular upper body clothing?: A Lot Help from  another person to put on and taking off regular lower body clothing?: A Lot 6 Click Score: 14    End of Session    OT Visit Diagnosis: Unsteadiness on feet (R26.81);Pain Pain - Right/Left: Left Pain - part of body: Arm   Activity Tolerance Patient tolerated treatment well   Patient Left in bed;with call bell/phone within reach;with bed alarm set   Nurse Communication          Time: 4917-9150 OT Time Calculation (min): 14 min  Charges: OT General Charges $OT Visit: 1 Visit OT Treatments $Therapeutic Activity: 8-22 mins  03/22/2020  Diana [name], OTR/L  Acute Rehabilitation Services  Office:  [phone]    Diana [name] 03/22/2020, 9:37 AM

## 2020-03-22 NOTE — Progress Notes (Signed)
Physical Therapy Treatment Patient Details Name: Diana [name] MRN: [id number] DOB: June 11, 1933 Today's Date: 03/22/2020    History of Present Illness 87 year old female who injured her arm in early December had a fracture of the left lateral epicondyle.  She was placed in a long-arm cast, and unfortunately she presented with a dislocated elbow and somehow removed the cast on her own.  Underwent Open reduction and internal fixation of left elbow fracture dislocation and addition of external fixator.  She is NWB through the L upper extremity.    PT Comments    Pt progressing towards physical therapy goals. Was able to perform transfers and min ambulation (~3 feet) with min-mod assist and LOB requiring assist to recover and prevent fall. Pt pleasantly confused throughout session, and does not recall information from OT session prior. Will continue to follow and progress as able per POC. SNF remains the most appropriate d/c disposition at this time.     Follow Up Recommendations  SNF;Supervision/Assistance - 24 hour     Equipment Recommendations  None recommended by PT    Recommendations for Other Services       Precautions / Restrictions Precautions Precautions: Fall Precaution Comments: L elbow external fixator Required Braces or Orthoses: Other Brace;Sling Other Brace: L elbow external fixator Restrictions Weight Bearing Restrictions: Yes LUE Weight Bearing: Non weight bearing    Mobility  Bed Mobility Overal bed mobility: Needs Assistance Bed Mobility: Supine to Sit;Sit to Supine     Supine to sit: Min guard Sit to supine: Min guard   General bed mobility comments: Min guard for safety and to maintain precautions  Transfers Overall transfer level: Needs assistance Equipment used: 1 person hand held assist Transfers: Sit to/from Stand Sit to Stand: Min assist         General transfer comment: Assist for power-up to full standing position. Mild LOB able to be  corrected with min assist.  Ambulation/Gait Ambulation/Gait assistance: Min assist;Mod assist Gait Distance (Feet): 3 Feet Assistive device: 1 person hand held assist Gait Pattern/deviations: Step-to pattern;Decreased stride length;Trunk flexed Gait velocity: Decreased Gait velocity interpretation: <1.31 ft/sec, indicative of household ambulator General Gait Details: Min - mod assist for balance support and to recover from LOB when taking pivotal steps around to the recliner chair. Therapist assisted in a slow, controlled lower into the recliner without any contact to the LUE.   Stairs             Wheelchair Mobility    Modified Rankin (Stroke Patients Only)       Balance Overall balance assessment: Needs assistance Sitting-balance support: Single extremity supported Sitting balance-Leahy Scale: Fair     Standing balance support: Single extremity supported Standing balance-Leahy Scale: Poor Standing balance comment: Reliant on HHA and external support                            Cognition Arousal/Alertness: Awake/alert Behavior During Therapy: WFL for tasks assessed/performed Overall Cognitive Status: History of cognitive impairments - at baseline                                 General Comments: safety concerns and ST memory deficits noted on eval.      Exercises General Exercises - Upper Extremity Shoulder Flexion: AAROM;Left;10 reps Shoulder ABduction: AAROM;Left;10 reps Wrist Flexion: AROM;Left;10 reps Wrist Extension: AROM;Left;10 reps Digit Composite Flexion: AROM;Left;10 reps Composite Extension:  AROM;Left;10 reps Other Exercises Other Exercises: standing marching in place x10 each side Other Exercises: standing toe taps forward and backward x5 each side    General Comments        Pertinent Vitals/Pain Pain Assessment: Faces Faces Pain Scale: Hurts little more Pain Location: L elbow Pain Descriptors / Indicators:  Sore Pain Intervention(s): Limited activity within patient's tolerance;Monitored during session;Repositioned    Home Living                      Prior Function            PT Goals (current goals can now be found in the care plan section) Acute Rehab PT Goals Patient Stated Goal: None stated PT Goal Formulation: With patient Time For Goal Achievement: 04/04/20 Potential to Achieve Goals: Fair Progress towards PT goals: Progressing toward goals    Frequency    Min 3X/week      PT Plan Current plan remains appropriate    Co-evaluation              AM-PAC PT "6 Clicks" Mobility   Outcome Measure  Help needed turning from your back to your side while in a flat bed without using bedrails?: A Little Help needed moving from lying on your back to sitting on the side of a flat bed without using bedrails?: A Little Help needed moving to and from a bed to a chair (including a wheelchair)?: A Little Help needed standing up from a chair using your arms (e.g., wheelchair or bedside chair)?: A Little Help needed to walk in hospital room?: A Lot Help needed climbing 3-5 steps with a railing? : Total 6 Click Score: 15    End of Session Equipment Utilized During Treatment: Gait belt Activity Tolerance: Patient tolerated treatment well Patient left: in chair;with call bell/phone within reach Nurse Communication: Mobility status;Other (comment) (Need for large/XL sling to fit over ex-fix) PT Visit Diagnosis: Unsteadiness on feet (R26.81);Muscle weakness (generalized) (M62.81);Repeated falls (R29.6);History of falling (Z91.81)     Time: 0930-1005 PT Time Calculation (min) (ACUTE ONLY): 35 min  Charges:  $Gait Training: 23-37 mins                     Diana [name], PT, DPT Acute Rehabilitation Services Pager: [phone] Office: [phone]    Thelma Comp 03/22/2020, 11:10 AM

## 2020-03-23 ENCOUNTER — Encounter (HOSPITAL_COMMUNITY): Payer: Self-pay | Admitting: Student

## 2020-03-23 DIAGNOSIS — S42432A Displaced fracture (avulsion) of lateral epicondyle of left humerus, initial encounter for closed fracture: Secondary | ICD-10-CM | POA: Diagnosis not present

## 2020-03-23 DIAGNOSIS — S53135A Medial dislocation of left ulnohumeral joint, initial encounter: Secondary | ICD-10-CM | POA: Diagnosis not present

## 2020-03-23 LAB — CBC
HCT: 33.7 % — ABNORMAL LOW (ref 36.0–46.0)
Hemoglobin: 11 g/dL — ABNORMAL LOW (ref 12.0–15.0)
MCH: 31 pg (ref 26.0–34.0)
MCHC: 32.6 g/dL (ref 30.0–36.0)
MCV: 94.9 fL (ref 80.0–100.0)
Platelets: 195 10*3/uL (ref 150–400)
RBC: 3.55 MIL/uL — ABNORMAL LOW (ref 3.87–5.11)
RDW: 13.7 % (ref 11.5–15.5)
WBC: 6.9 10*3/uL (ref 4.0–10.5)
nRBC: 0 % (ref 0.0–0.2)

## 2020-03-23 LAB — BASIC METABOLIC PANEL
Anion gap: 10 (ref 5–15)
BUN: 30 mg/dL — ABNORMAL HIGH (ref 8–23)
CO2: 22 mmol/L (ref 22–32)
Calcium: 8.4 mg/dL — ABNORMAL LOW (ref 8.9–10.3)
Chloride: 102 mmol/L (ref 98–111)
Creatinine, Ser: 1.66 mg/dL — ABNORMAL HIGH (ref 0.44–1.00)
GFR, Estimated: 30 mL/min — ABNORMAL LOW (ref >60)
Glucose, Bld: 174 mg/dL — ABNORMAL HIGH (ref 70–99)
Potassium: 4.3 mmol/L (ref 3.5–5.1)
Sodium: 134 mmol/L — ABNORMAL LOW (ref 135–145)

## 2020-03-23 LAB — GLUCOSE, CAPILLARY
Glucose-Capillary: 169 mg/dL — ABNORMAL HIGH (ref 70–99)
Glucose-Capillary: 133 mg/dL — ABNORMAL HIGH (ref 70–99)
Glucose-Capillary: 198 mg/dL — ABNORMAL HIGH (ref 70–99)
Glucose-Capillary: 81 mg/dL (ref 70–99)

## 2020-03-23 LAB — SARS CORONAVIRUS 2 (TAT 6-24 HRS): SARS Coronavirus 2: NEGATIVE

## 2020-03-23 MED ORDER — HYDROCODONE-ACETAMINOPHEN 5-325 MG PO TABS: 1.0000 | ORAL_TABLET | Freq: Four times a day (QID) | ORAL | 0 refills | Status: DC | PRN

## 2020-03-23 MED ORDER — POLYETHYLENE GLYCOL 3350 17 G PO PACK: 17.0000 g | PACK | Freq: Every day | ORAL | 0 refills | Status: DC | PRN

## 2020-03-23 MED ORDER — ONDANSETRON HCL 4 MG PO TABS: 4.0000 mg | ORAL_TABLET | Freq: Four times a day (QID) | ORAL | 0 refills | Status: DC | PRN

## 2020-03-23 NOTE — Progress Notes (Signed)
Orthopaedic Trauma Progress Note  SUBJECTIVE: Reports mild pain today. No chest pain. No SOB. No nausea/vomiting. No other complaints or questions.  Patient pleasantly demented.  OBJECTIVE:  Vitals:   03/23/20 1156 03/23/20 1159  BP: 109/64   Pulse: (!) 47 67  Resp: 16   Temp: 98.6 F (37 C)   SpO2: 97% 96%    General: Sitting up in bed, NAD. Pleasantly demented Respiratory: No increased work of breathing.  Left Upper Extremity: Ex-Fix in place.  Ace wrap and pin sites clean, dry, intact.  Tenderness with palpation throughout the arm.  Notable swelling to the hand and fingers.  Able to wiggle each of her fingers.  Motor sensation light touch distally.  Hand warm and well-perfused.  Neurovascularly intact  IMAGING: Stable post op imaging.   LABS:  Results for orders placed or performed during the hospital encounter of 03/21/20 (from the past 24 hour(s))  Glucose, capillary     Status: None   Collection Time: 03/22/20  5:03 PM  Result Value Ref Range   Glucose-Capillary 75 70 - 99 mg/dL  Glucose, capillary     Status: Abnormal   Collection Time: 03/22/20  9:11 PM  Result Value Ref Range   Glucose-Capillary 156 (H) 70 - 99 mg/dL   Comment 1 Notify RN    Comment 2 Document in Chart   Basic metabolic panel     Status: Abnormal   Collection Time: 03/23/20  4:29 AM  Result Value Ref Range   Sodium 134 (L) 135 - 145 mmol/L   Potassium 4.3 3.5 - 5.1 mmol/L   Chloride 102 98 - 111 mmol/L   CO2 22 22 - 32 mmol/L   Glucose, Bld 174 (H) 70 - 99 mg/dL   BUN 30 (H) 8 - 23 mg/dL   Creatinine, Ser 1.66 (H) 0.44 - 1.00 mg/dL   Calcium 8.4 (L) 8.9 - 10.3 mg/dL   GFR, Estimated 30 (L) >60 mL/min   Anion gap 10 5 - 15  CBC     Status: Abnormal   Collection Time: 03/23/20  4:29 AM  Result Value Ref Range   WBC 6.9 4.0 - 10.5 K/uL   RBC 3.55 (L) 3.87 - 5.11 MIL/uL   Hemoglobin 11.0 (L) 12.0 - 15.0 g/dL   HCT 33.7 (L) 36.0 - 46.0 %   MCV 94.9 80.0 - 100.0 fL   MCH 31.0 26.0 - 34.0 pg    MCHC 32.6 30.0 - 36.0 g/dL   RDW 13.7 11.5 - 15.5 %   Platelets 195 150 - 400 K/uL   nRBC 0.0 0.0 - 0.2 %  Glucose, capillary     Status: Abnormal   Collection Time: 03/23/20  6:38 AM  Result Value Ref Range   Glucose-Capillary 169 (H) 70 - 99 mg/dL   Comment 1 Notify RN    Comment 2 Document in Chart   Glucose, capillary     Status: Abnormal   Collection Time: 03/23/20 11:56 AM  Result Value Ref Range   Glucose-Capillary 133 (H) 70 - 99 mg/dL   Comment 1 Notify RN    Comment 2 Document in Chart     ASSESSMENT: KALEEAH GINGERICH is a 87 y.o. female, 2 Days Post-Op s/p Procedure(s): ORIF LEFT ELBOW/OLECRANON FRACTURE  CV/Blood loss: Acute blood loss anemia, Hgb 11.0 this morning. Hemodynamically stable  PLAN: Weightbearing: NWB LUE Incisional and dressing care: Leave Ace wrap in place. Continue daily pin site dressing changes Showering: Okay to shower with assistance.  Keep the left arm covered when doing so. Orthopedic device(s): Ex-Fix LUE  Pain management:  1. Tylenol 325-650 mg q 6 hours PRN 2. Robaxin 500 mg q 6 hours PRN 3. Norco 5-325 mg q 4 hours PRN moderate pain 4. Oxycodone 5 mg q 4 hours PRN severe pain 5. Morphine 0.5-1 mg q 2 hours PRN VTE prophylaxis: Eliquis, SCDs ID:  Ancef 2gm post op comepleted Foley/Lines:  No foley, KVO IVFs Impediments to Fracture Healing: Vit D level looks good at 31 Dispo: Therapies as tolerated, PT/OT recommending SNF. TOC team following and it waiting to hear back from [facility]. Repeat covid test ordered. D/c hopefully in next 24 hours Follow - up plan: On 04/03/20 for repeat x-rays  Contact information:  Katha Hamming MD, Patrecia Pace PA-C. After hours and holidays please check [email or web address] for group call information for [facility]   Aliya Sol A. Ricci Barker, PA-C [phone] (office) [email or web address]

## 2020-03-23 NOTE — TOC Progression Note (Addendum)
Transition of Care [facility]) - Progression Note    Patient Details  Name: Diana [name] MRN: [id number] Date of Birth: 1933/10/18  Transition of [facility]) CM/SW [place], RN Phone Number: [phone]  03/23/2020, 11:52 AM  Clinical Narrative:    Patient is forgetful CM has been attempting to call [place] with a message that the line is busy. Called Yong Channel who states she has another number for Lelon Frohlich but cant get to it right now. CM will attempt to call later.  1210 CM called Shelia back for another number for sister in law and Adela Lank is requesting that CM call back again. CM will attempt again later    1335 CM spoke with Ulyses Jarred  Who states that she is the patients only living relative and the patients POA.Lelon Frohlich has been made aware of bed offers. Lelon Frohlich does not want patient to go to [place], [facility] or [place]. POA is requesting that CM follow up with [facility] or [facility] CM to follow up with those 2 facilities now. CMhas faxed out to [facility]) and sent message to [place] at [place].    Expected Discharge Plan: [place] Barriers to Discharge: Continued Medical Work up  Expected Discharge Plan and Services Expected Discharge Plan: [place] In-house Referral: NA Discharge Planning Services: CM Consult Post Acute Care Choice: [place] Living arrangements for the past 2 months: Single Family Home                 DME Arranged: N/A DME Agency: NA       HH Arranged: NA HH Agency: NA         Social Determinants of Health (SDOH) Interventions    Readmission Risk Interventions No flowsheet data found.

## 2020-03-23 NOTE — Discharge Summary (Addendum)
Orthopaedic Trauma Service (OTS) Discharge Summary   Patient ID: Diana [name] MRN: [id number] DOB/AGE: 85-May-1935 85 y.o.  Admit date: 03/21/2020 Discharge date: 03/28/2020  Admission Diagnoses: Left elbow fracture dislocation  Discharge Diagnoses:  Principal Problem:   Closed dislocation of left elbow   Past Medical History:  Diagnosis Date  . Anemia   . Anemia of decreased vitamin B12 absorption 05/26/2005  . Arthritis   . Atrial fibrillation ([place])   . Breast cancer ([place]) 2001  . Cataract   . GERD (gastroesophageal reflux disease)   . History of tobacco abuse   . Hx of colonic polyps   . Hyperlipidemia   . Hypertension   . Hypothyroidism   . Memory loss   . Obesity   . OSA on CPAP 09/2007   AHI 10.6/hr overall, 43.64/hr during REM, lost weight, does not use cpap  . Osteopenia   . Renal insufficiency   . Type 2 diabetes mellitus ([place])     Procedures Performed: 1. CPT [id number] reduction and internal fixation of left elbow fracture dislocation 2. CPT 20690-External fixation of left elbow    Discharged Condition: stable  Hospital Course: Patient presented to [facility] on 03/21/2020 for scheduled procedure on left elbow.  She was taken to the operating room by Dr. Doreatha Martin for the above procedure and tolerated this well.  Due to the unstable nature of her injury once elbow was reduced, patient was placed in an external fixator to stabilize the elbow joint.  Patient instructed be nonweightbearing on left upper extremity postoperatively.  Patient was admitted for pain control and to work with physical/occupational therapy.  On their evaluation, therapies felt it is most appropriate for patient to be discharged to SNF.  The remainder of the patient's hospitalization was dedicated to achieving adequate pain control and coordinating discharge to skilled nursing facility. On 03/28/2020, the patient was tolerating diet, working well with therapies, pain well  controlled, vital signs stable, dressings clean, dry, intact and felt stable for discharge to SNF. Patient will follow up as below and knows to call with questions or concerns.     Consults: None  Significant Diagnostic Studies:   Results for orders placed or performed during the hospital encounter of 03/21/20 (from the past 168 hour(s))  Glucose, capillary   Collection Time: 03/21/20  1:45 PM  Result Value Ref Range   Glucose-Capillary 114 (H) 70 - 99 mg/dL  Glucose, capillary   Collection Time: 03/21/20  4:41 PM  Result Value Ref Range   Glucose-Capillary 150 (H) 70 - 99 mg/dL  VITAMIN D 25 Hydroxy (Vit-D Deficiency, Fractures)   Collection Time: 03/21/20  4:59 PM  Result Value Ref Range   Vit D, 25-Hydroxy 31.07 30 - 100 ng/mL  Hemoglobin A1c   Collection Time: 03/21/20  4:59 PM  Result Value Ref Range   Hgb A1c MFr Bld 6.1 (H) 4.8 - 5.6 %   Mean Plasma Glucose 128.37 mg/dL  Glucose, capillary   Collection Time: 03/21/20  9:06 PM  Result Value Ref Range   Glucose-Capillary 116 (H) 70 - 99 mg/dL  Basic metabolic panel   Collection Time: 03/22/20  3:14 AM  Result Value Ref Range   Sodium 137 135 - 145 mmol/L   Potassium 4.4 3.5 - 5.1 mmol/L   Chloride 102 98 - 111 mmol/L   CO2 21 (L) 22 - 32 mmol/L   Glucose, Bld 166 (H) 70 - 99 mg/dL   BUN 23 8 - 23 mg/dL  Creatinine, Ser 1.44 (H) 0.44 - 1.00 mg/dL   Calcium 8.6 (L) 8.9 - 10.3 mg/dL   GFR, Estimated 36 (L) >60 mL/min   Anion gap 14 5 - 15  CBC   Collection Time: 03/22/20  3:14 AM  Result Value Ref Range   WBC 7.1 4.0 - 10.5 K/uL   RBC 4.03 3.87 - 5.11 MIL/uL   Hemoglobin 12.1 12.0 - 15.0 g/dL   HCT 39.4 36.0 - 46.0 %   MCV 97.8 80.0 - 100.0 fL   MCH 30.0 26.0 - 34.0 pg   MCHC 30.7 30.0 - 36.0 g/dL   RDW 13.7 11.5 - 15.5 %   Platelets 206 150 - 400 K/uL   nRBC 0.0 0.0 - 0.2 %  Glucose, capillary   Collection Time: 03/22/20  6:06 AM  Result Value Ref Range   Glucose-Capillary 140 (H) 70 - 99 mg/dL   Comment  1 Notify RN    Comment 2 Document in Chart   Glucose, capillary   Collection Time: 03/22/20 11:34 AM  Result Value Ref Range   Glucose-Capillary 143 (H) 70 - 99 mg/dL  Glucose, capillary   Collection Time: 03/22/20  3:59 PM  Result Value Ref Range   Glucose-Capillary 62 (L) 70 - 99 mg/dL  Glucose, capillary   Collection Time: 03/22/20  5:03 PM  Result Value Ref Range   Glucose-Capillary 75 70 - 99 mg/dL  Glucose, capillary   Collection Time: 03/22/20  9:11 PM  Result Value Ref Range   Glucose-Capillary 156 (H) 70 - 99 mg/dL   Comment 1 Notify RN    Comment 2 Document in Chart   Basic metabolic panel   Collection Time: 03/23/20  4:29 AM  Result Value Ref Range   Sodium 134 (L) 135 - 145 mmol/L   Potassium 4.3 3.5 - 5.1 mmol/L   Chloride 102 98 - 111 mmol/L   CO2 22 22 - 32 mmol/L   Glucose, Bld 174 (H) 70 - 99 mg/dL   BUN 30 (H) 8 - 23 mg/dL   Creatinine, Ser 1.66 (H) 0.44 - 1.00 mg/dL   Calcium 8.4 (L) 8.9 - 10.3 mg/dL   GFR, Estimated 30 (L) >60 mL/min   Anion gap 10 5 - 15  CBC   Collection Time: 03/23/20  4:29 AM  Result Value Ref Range   WBC 6.9 4.0 - 10.5 K/uL   RBC 3.55 (L) 3.87 - 5.11 MIL/uL   Hemoglobin 11.0 (L) 12.0 - 15.0 g/dL   HCT 33.7 (L) 36.0 - 46.0 %   MCV 94.9 80.0 - 100.0 fL   MCH 31.0 26.0 - 34.0 pg   MCHC 32.6 30.0 - 36.0 g/dL   RDW 13.7 11.5 - 15.5 %   Platelets 195 150 - 400 K/uL   nRBC 0.0 0.0 - 0.2 %  Glucose, capillary   Collection Time: 03/23/20  6:38 AM  Result Value Ref Range   Glucose-Capillary 169 (H) 70 - 99 mg/dL   Comment 1 Notify RN    Comment 2 Document in Chart   Glucose, capillary   Collection Time: 03/23/20 11:56 AM  Result Value Ref Range   Glucose-Capillary 133 (H) 70 - 99 mg/dL   Comment 1 Notify RN    Comment 2 Document in Chart   SARS CORONAVIRUS 2 (TAT 6-24 HRS) Nasopharyngeal Nasopharyngeal Swab   Collection Time: 03/23/20 12:11 PM   Specimen: Nasopharyngeal Swab  Result Value Ref Range   SARS Coronavirus 2  NEGATIVE NEGATIVE  Glucose,  capillary   Collection Time: 03/23/20  4:29 PM  Result Value Ref Range   Glucose-Capillary 198 (H) 70 - 99 mg/dL  Glucose, capillary   Collection Time: 03/23/20  9:11 PM  Result Value Ref Range   Glucose-Capillary 81 70 - 99 mg/dL   Comment 1 Notify RN    Comment 2 Document in Chart   Basic metabolic panel   Collection Time: 03/24/20  6:03 AM  Result Value Ref Range   Sodium 137 135 - 145 mmol/L   Potassium 4.2 3.5 - 5.1 mmol/L   Chloride 102 98 - 111 mmol/L   CO2 26 22 - 32 mmol/L   Glucose, Bld 127 (H) 70 - 99 mg/dL   BUN 33 (H) 8 - 23 mg/dL   Creatinine, Ser 1.54 (H) 0.44 - 1.00 mg/dL   Calcium 8.5 (L) 8.9 - 10.3 mg/dL   GFR, Estimated 33 (L) >60 mL/min   Anion gap 9 5 - 15  Glucose, capillary   Collection Time: 03/24/20  6:35 AM  Result Value Ref Range   Glucose-Capillary 137 (H) 70 - 99 mg/dL   Comment 1 Notify RN    Comment 2 Document in Chart   Glucose, capillary   Collection Time: 03/24/20 11:46 AM  Result Value Ref Range   Glucose-Capillary 138 (H) 70 - 99 mg/dL  Glucose, capillary   Collection Time: 03/24/20  4:38 PM  Result Value Ref Range   Glucose-Capillary 150 (H) 70 - 99 mg/dL   Comment 1 Notify RN    Comment 2 Document in Chart   Glucose, capillary   Collection Time: 03/24/20  9:02 PM  Result Value Ref Range   Glucose-Capillary 117 (H) 70 - 99 mg/dL   Comment 1 Notify RN    Comment 2 Document in Chart   Glucose, capillary   Collection Time: 03/25/20  6:25 AM  Result Value Ref Range   Glucose-Capillary 134 (H) 70 - 99 mg/dL   Comment 1 Notify RN    Comment 2 Document in Chart   Basic metabolic panel   Collection Time: 03/25/20 10:38 AM  Result Value Ref Range   Sodium 137 135 - 145 mmol/L   Potassium 4.5 3.5 - 5.1 mmol/L   Chloride 105 98 - 111 mmol/L   CO2 24 22 - 32 mmol/L   Glucose, Bld 141 (H) 70 - 99 mg/dL   BUN 33 (H) 8 - 23 mg/dL   Creatinine, Ser 1.40 (H) 0.44 - 1.00 mg/dL   Calcium 8.7 (L) 8.9 - 10.3  mg/dL   GFR, Estimated 37 (L) >60 mL/min   Anion gap 8 5 - 15  Glucose, capillary   Collection Time: 03/25/20 12:35 PM  Result Value Ref Range   Glucose-Capillary 202 (H) 70 - 99 mg/dL   Comment 1 Notify RN    Comment 2 Document in Chart   Glucose, capillary   Collection Time: 03/25/20  5:20 PM  Result Value Ref Range   Glucose-Capillary 94 70 - 99 mg/dL   Comment 1 Notify RN    Comment 2 Document in Chart   Glucose, capillary   Collection Time: 03/25/20  9:15 PM  Result Value Ref Range   Glucose-Capillary 85 70 - 99 mg/dL   Comment 1 Notify RN    Comment 2 Document in Chart   Basic metabolic panel   Collection Time: 03/26/20  3:48 AM  Result Value Ref Range   Sodium 137 135 - 145 mmol/L   Potassium 5.0 3.5 -  5.1 mmol/L   Chloride 103 98 - 111 mmol/L   CO2 26 22 - 32 mmol/L   Glucose, Bld 133 (H) 70 - 99 mg/dL   BUN 38 (H) 8 - 23 mg/dL   Creatinine, Ser 0.62 (H) 0.44 - 1.00 mg/dL   Calcium 8.6 (L) 8.9 - 10.3 mg/dL   GFR, Estimated 28 (L) >60 mL/min   Anion gap 8 5 - 15  Glucose, capillary   Collection Time: 03/26/20  5:50 AM  Result Value Ref Range   Glucose-Capillary 132 (H) 70 - 99 mg/dL   Comment 1 Notify RN    Comment 2 Document in Chart   Glucose, capillary   Collection Time: 03/26/20 12:15 PM  Result Value Ref Range   Glucose-Capillary 224 (H) 70 - 99 mg/dL   Comment 1 Notify RN    Comment 2 Document in Chart   Glucose, capillary   Collection Time: 03/26/20  4:44 PM  Result Value Ref Range   Glucose-Capillary 91 70 - 99 mg/dL   Comment 1 Notify RN    Comment 2 Document in Chart   Glucose, capillary   Collection Time: 03/26/20  9:03 PM  Result Value Ref Range   Glucose-Capillary 53 (L) 70 - 99 mg/dL   Comment 1 Notify RN    Comment 2 Document in Chart   Glucose, capillary   Collection Time: 03/26/20 10:00 PM  Result Value Ref Range   Glucose-Capillary 69 (L) 70 - 99 mg/dL   Comment 1 Notify RN    Comment 2 Document in Chart   Glucose, capillary    Collection Time: 03/26/20 10:46 PM  Result Value Ref Range   Glucose-Capillary 129 (H) 70 - 99 mg/dL   Comment 1 Notify RN    Comment 2 Document in Chart   Basic metabolic panel   Collection Time: 03/27/20  5:28 AM  Result Value Ref Range   Sodium 134 (L) 135 - 145 mmol/L   Potassium 4.7 3.5 - 5.1 mmol/L   Chloride 99 98 - 111 mmol/L   CO2 25 22 - 32 mmol/L   Glucose, Bld 166 (H) 70 - 99 mg/dL   BUN 36 (H) 8 - 23 mg/dL   Creatinine, Ser 3.76 (H) 0.44 - 1.00 mg/dL   Calcium 8.7 (L) 8.9 - 10.3 mg/dL   GFR, Estimated 31 (L) >60 mL/min   Anion gap 10 5 - 15  Glucose, capillary   Collection Time: 03/27/20  6:58 AM  Result Value Ref Range   Glucose-Capillary 124 (H) 70 - 99 mg/dL   Comment 1 Notify RN    Comment 2 Document in Chart   CBC   Collection Time: 03/27/20  8:56 AM  Result Value Ref Range   WBC 5.2 4.0 - 10.5 K/uL   RBC 3.91 3.87 - 5.11 MIL/uL   Hemoglobin 11.7 (L) 12.0 - 15.0 g/dL   HCT 28.3 15.1 - 76.1 %   MCV 95.1 80.0 - 100.0 fL   MCH 29.9 26.0 - 34.0 pg   MCHC 31.5 30.0 - 36.0 g/dL   RDW 60.7 37.1 - 06.2 %   Platelets 253 150 - 400 K/uL   nRBC 0.0 0.0 - 0.2 %  SARS CORONAVIRUS 2 (TAT 6-24 HRS) Nasopharyngeal Nasopharyngeal Swab   Collection Time: 03/27/20 11:50 AM   Specimen: Nasopharyngeal Swab  Result Value Ref Range   SARS Coronavirus 2 NEGATIVE NEGATIVE  Glucose, capillary   Collection Time: 03/27/20 11:57 AM  Result Value Ref Range  Glucose-Capillary 171 (H) 70 - 99 mg/dL   Comment 1 Notify RN    Comment 2 Document in Chart   Glucose, capillary   Collection Time: 03/27/20  5:10 PM  Result Value Ref Range   Glucose-Capillary 147 (H) 70 - 99 mg/dL   Comment 1 Notify RN    Comment 2 Document in Chart   Glucose, capillary   Collection Time: 03/27/20  9:11 PM  Result Value Ref Range   Glucose-Capillary 105 (H) 70 - 99 mg/dL   Comment 1 Notify RN    Comment 2 Document in Chart   Glucose, capillary   Collection Time: 03/28/20  7:11 AM  Result Value  Ref Range   Glucose-Capillary 159 (H) 70 - 99 mg/dL   Comment 1 Notify RN    Comment 2 Document in Chart   Basic metabolic panel   Collection Time: 03/28/20  7:43 AM  Result Value Ref Range   Sodium 133 (L) 135 - 145 mmol/L   Potassium 4.8 3.5 - 5.1 mmol/L   Chloride 101 98 - 111 mmol/L   CO2 23 22 - 32 mmol/L   Glucose, Bld 166 (H) 70 - 99 mg/dL   BUN 33 (H) 8 - 23 mg/dL   Creatinine, Ser 1.68 (H) 0.44 - 1.00 mg/dL   Calcium 8.7 (L) 8.9 - 10.3 mg/dL   GFR, Estimated 30 (L) >60 mL/min   Anion gap 9 5 - 15     Treatments: IV hydration, antibiotics: Ancef, analgesia: acetaminophen and Vicodin, cardiac meds: Losartan, anticoagulation: Eliquis, insulin: regular, therapies: PT and OT and surgery: as above  Discharge Exam: General: Resting in bed, no acute distress.  Pleasantly demented Respiratory: No increased work of breathing.  Left Upper Extremity: Ex-Fix in place. Dressings taken down. Incisions clean and dry. Pin sites clean and dry. Neuro intact    Disposition: Discharge disposition: 03-Skilled Nursing Facility       Allergies as of 03/28/2020      Reactions   Codeine Sulfate Nausea Only      Medication List    TAKE these medications   acetaminophen 500 MG tablet Commonly known as: TYLENOL Take 1,000 mg by mouth every 6 (six) hours as needed (pain).   apixaban 2.5 MG Tabs tablet Commonly known as: ELIQUIS Take 1 tablet (2.5 mg total) by mouth 2 (two) times daily.   Combigan 0.2-0.5 % ophthalmic solution Generic drug: brimonidine-timolol Place 1 drop into both eyes every 12 (twelve) hours. What changed: when to take this   ferrous sulfate 325 (65 FE) MG tablet Take 1 tablet (325 mg total) by mouth 2 (two) times daily with a meal. What changed: when to take this   FLUoxetine 20 MG capsule Commonly known as: PROZAC Take 1 capsule (20 mg total) by mouth daily.   glimepiride 2 MG tablet Commonly known as: AMARYL Take 2 mg by mouth in the morning and at  bedtime.   HYDROcodone-acetaminophen 5-325 MG tablet Commonly known as: NORCO/VICODIN Take 1 tablet by mouth every 6 (six) hours as needed for severe pain.   levothyroxine 50 MCG tablet Commonly known as: SYNTHROID Take 1 tablet (50 mcg total) by mouth daily.   losartan 50 MG tablet Commonly known as: COZAAR Take 1 tablet (50 mg total) by mouth 2 (two) times daily. What changed: when to take this   metFORMIN 500 MG 24 hr tablet Commonly known as: GLUCOPHAGE-XR Take 500 mg by mouth daily.   metoprolol tartrate 25 MG tablet Commonly known as:  LOPRESSOR Take 0.5 tablets (12.5 mg total) by mouth 2 (two) times daily.   ondansetron 4 MG tablet Commonly known as: ZOFRAN Take 1 tablet (4 mg total) by mouth every 6 (six) hours as needed for nausea or vomiting.   pantoprazole 40 MG tablet Commonly known as: PROTONIX Take 1 tablet (40 mg total) by mouth 2 (two) times daily before a meal.   polyethylene glycol 17 g packet Commonly known as: MIRALAX / GLYCOLAX Take 17 g by mouth daily as needed for mild constipation.   PreserVision AREDS 2 Caps Take 2 capsules by mouth daily. What changed: how much to take   rosuvastatin 5 MG tablet Commonly known as: CRESTOR Take 5 mg by mouth daily.       Follow-up Information    Khiana Camino, Thomasene Lot, MD. Schedule an appointment as soon as possible for a visit on 04/03/2020.   Specialty: Orthopedic Surgery Why: for wound check and repeat x-rays Contact information: [place] [address] [phone]               Discharge Instructions and Plan: Patient will be discharged to SNF. Will be discharged on her home dose Eliquis for DVT prophylaxis. will remain non-weightbearing on left upper extremity. Patient will follow up with Dr. Doreatha Martin in 1 week for repeat x-rays.   Signed:  Leary Roca. Carmie Kanner ?[phone]? (phone) 03/28/2020, 11:01 AM  [facility] [place]  [id number] [phone] (F)

## 2020-03-23 NOTE — Progress Notes (Signed)
Physical Therapy Treatment Patient Details Name: Diana [name] MRN: [id number] DOB: Jul 25, 1933 Today's Date: 03/23/2020    History of Present Illness 87 year old female who injured her arm in early December had a fracture of the left lateral epicondyle.  She was placed in a long-arm cast, and unfortunately she presented with a dislocated elbow and somehow removed the cast on her own.  Underwent Open reduction and internal fixation of left elbow fracture dislocation and addition of external fixator.  She is NWB through the L upper extremity.    PT Comments    Patient progressing slowly towards PT goals. Able to perform bed mobility with min guard assist with Min A to bring RLE into bed to return to supine. Worked on LE strengthening by performing functional sit to stands with Min A to power up and to steady. Pt requires UE support in standing. Tolerated side stepping along side bed with Min A as well. Performed there ex with LUE. Pt with baseline cognitive deficits relating to safety, memory and orientation but follows commands well. Continues to be appropriate for SNF. Will follow.   Follow Up Recommendations  SNF;Supervision/Assistance - 24 hour     Equipment Recommendations  None recommended by PT    Recommendations for Other Services       Precautions / Restrictions Precautions Precautions: Fall Precaution Comments: L elbow external fixator Required Braces or Orthoses: Other Brace;Sling Restrictions Weight Bearing Restrictions: Yes LUE Weight Bearing: Non weight bearing    Mobility  Bed Mobility Overal bed mobility: Needs Assistance Bed Mobility: Supine to Sit;Sit to Supine     Supine to sit: Min guard Sit to supine: Min assist   General bed mobility comments: Min guard for safety and to maintain precautions; assist needed to bring RLE into bed.  Transfers Overall transfer level: Needs assistance Equipment used: 1 person hand held assist Transfers: Sit to/from  Stand Sit to Stand: Min assist         General transfer comment: ASsist to power to standing with cues for hand placement/technique with pt reaching for bed rail for support in standing. Stood from EOB x6.  Ambulation/Gait Ambulation/Gait assistance: Min assist Gait Distance (Feet): 5 Feet (2 bouts) Assistive device: 1 person hand held assist Gait Pattern/deviations: Shuffle Gait velocity: Decreased   General Gait Details: Able to side step along side bed in both directions with Min A for balance; weakness noted in BLEs.   Stairs             Wheelchair Mobility    Modified Rankin (Stroke Patients Only)       Balance Overall balance assessment: Needs assistance Sitting-balance support: Feet supported;Single extremity supported Sitting balance-Leahy Scale: Fair Sitting balance - Comments: supervision for safety.   Standing balance support: During functional activity;Single extremity supported Standing balance-Leahy Scale: Poor Standing balance comment: Reliant on HHA and external support                            Cognition Arousal/Alertness: Awake/alert   Overall Cognitive Status: History of cognitive impairments - at baseline                                 General Comments: Oriented to place, self and date; does not know why she is in the hospital. POor awareness of safety/deficits and impaired STM.      Exercises General Exercises - Upper  Extremity Digit Composite Flexion: AROM;Left;10 reps Composite Extension: AROM;Left;10 reps Other Exercises Other Exercises: Scapular retraction x10. shoulder shrugs x10    General Comments        Pertinent Vitals/Pain Pain Assessment: Faces Faces Pain Scale: Hurts little more Pain Location: L elbow Pain Descriptors / Indicators: Sore;Grimacing Pain Intervention(s): Monitored during session;Repositioned;Limited activity within patient's tolerance    Home Living                       Prior Function            PT Goals (current goals can now be found in the care plan section) Progress towards PT goals: Progressing toward goals (slowly)    Frequency    Min 3X/week      PT Plan Current plan remains appropriate    Co-evaluation              AM-PAC PT "6 Clicks" Mobility   Outcome Measure  Help needed turning from your back to your side while in a flat bed without using bedrails?: A Little Help needed moving from lying on your back to sitting on the side of a flat bed without using bedrails?: A Little Help needed moving to and from a bed to a chair (including a wheelchair)?: A Little Help needed standing up from a chair using your arms (e.g., wheelchair or bedside chair)?: A Little Help needed to walk in hospital room?: A Lot Help needed climbing 3-5 steps with a railing? : Total 6 Click Score: 15    End of Session Equipment Utilized During Treatment: Gait belt;Other (comment) (sling) Activity Tolerance: Patient tolerated treatment well Patient left: in bed;with call bell/phone within reach Nurse Communication: Mobility status PT Visit Diagnosis: Unsteadiness on feet (R26.81);Muscle weakness (generalized) (M62.81);Repeated falls (R29.6);History of falling (Z91.81)     Time: 6222-9798 PT Time Calculation (min) (ACUTE ONLY): 18 min  Charges:  $Therapeutic Activity: 8-22 mins                     Marisa Severin, PT, DPT Acute Rehabilitation Services Pager [phone] Office [phone]       Marguarite Arbour A Sabra Heck 03/23/2020, 10:07 AM

## 2020-03-23 NOTE — Discharge Instructions (Addendum)
Orthopaedic Trauma Service Discharge Instructions   General Discharge Instructions  WEIGHT BEARING STATUS: non-weightbearing left arm  RANGE OF MOTION/ACTIVITY: Use sling for comfort. Ok for wrist motion as tolerated.   Wound Care: Daily pin site dressing changes. Change ace wrap dressing as needed  DVT/PE prophylaxis: Eliquis  Diet: as you were eating previously.  Can use over the counter stool softeners and bowel preparations, such as Miralax, to help with bowel movements.  Narcotics can be constipating.  Be sure to drink plenty of fluids  PAIN MEDICATION USE AND EXPECTATIONS  You have likely been given narcotic medications to help control your pain.  After a traumatic event that results in an fracture (broken bone) with or without surgery, it is ok to use narcotic pain medications to help control one's pain.  We understand that everyone responds to pain differently and each individual patient will be evaluated on a regular basis for the continued need for narcotic medications. Ideally, narcotic medication use should last no more than 6-8 weeks (coinciding with fracture healing).   As a patient it is your responsibility as well to monitor narcotic medication use and report the amount and frequency you use these medications when you come to your office visit.   We would also advise that if you are using narcotic medications, you should take a dose prior to therapy to maximize you participation.  IF YOU ARE ON NARCOTIC MEDICATIONS IT IS NOT PERMISSIBLE TO OPERATE A MOTOR VEHICLE (MOTORCYCLE/CAR/TRUCK/MOPED) OR HEAVY MACHINERY DO NOT MIX NARCOTICS WITH OTHER CNS (CENTRAL NERVOUS SYSTEM) DEPRESSANTS SUCH AS ALCOHOL   STOP SMOKING OR USING NICOTINE PRODUCTS!!!!  As discussed nicotine severely impairs your body's ability to heal surgical and traumatic wounds but also impairs bone healing.  Wounds and bone heal by forming microscopic blood vessels (angiogenesis) and nicotine is a  vasoconstrictor (essentially, shrinks blood vessels).  Therefore, if vasoconstriction occurs to these microscopic blood vessels they essentially disappear and are unable to deliver necessary nutrients to the healing tissue.  This is one modifiable factor that you can do to dramatically increase your chances of healing your injury.    (This means no smoking, no nicotine gum, patches, etc)  DO NOT USE NONSTEROIDAL ANTI-INFLAMMATORY DRUGS (NSAID'S)  Using products such as Advil (ibuprofen), Aleve (naproxen), Motrin (ibuprofen) for additional pain control during fracture healing can delay and/or prevent the healing response.  If you would like to take over the counter (OTC) medication, Tylenol (acetaminophen) is ok.  However, some narcotic medications that are given for pain control contain acetaminophen as well. Therefore, you should not exceed more than 4000 mg of tylenol in a day if you do not have liver disease.  Also note that there are may OTC medicines, such as cold medicines and allergy medicines that my contain tylenol as well.  If you have any questions about medications and/or interactions please ask your doctor/PA or your pharmacist.      ICE AND ELEVATE INJURED/OPERATIVE EXTREMITY  Using ice and elevating the injured extremity above your heart can help with swelling and pain control.  Icing in a pulsatile fashion, such as 20 minutes on and 20 minutes off, can be followed.    Do not place ice directly on skin. Make sure there is a barrier between to skin and the ice pack.    Using frozen items such as frozen peas works well as the conform nicely to the are that needs to be iced.  USE AN ACE WRAP OR TED  HOSE FOR SWELLING CONTROL  In addition to icing and elevation, Ace wraps or TED hose are used to help limit and resolve swelling.  It is recommended to use Ace wraps or TED hose until you are informed to stop.    When using Ace Wraps start the wrapping distally (farthest away from the body) and  wrap proximally (closer to the body)   Example: If you had surgery on your leg or thing and you do not have a splint on, start the ace wrap at the toes and work your way up to the thigh        If you had surgery on your upper extremity and do not have a splint on, start the ace wrap at your fingers and work your way up to the upper arm [place]: [phone]   VISIT OUR WEBSITE FOR ADDITIONAL INFORMATION: [email or web address]      Discharge Pin Site Instructions  Dress pins daily with Kerlix roll starting on POD 2. Wrap the Kerlix so that it tamps the skin down around the pin-skin interface to prevent/limit motion of the skin relative to the pin.  (Pin-skin motion is the primary cause of pain and infection related to external fixator pin sites).  Remove any crust or coagulum that may obstruct drainage with a saline moistened gauze or soap and water.  After POD 3, if there is no discernable drainage on the pin site dressing, the interval for change can by increased to every other day.  You may shower with the fixator, cleaning all pin sites gently with soap and water.  If you have a surgical wound this needs to be completely dry and without drainage before showering.  The extremity can be lifted by the fixator to facilitate wound care and transfers.  Notify the office/Doctor if you experience increasing drainage, redness, or pain from a pin site, or if you notice purulent (thick, snot-like) drainage.  Discharge Wound Care Instructions  Do NOT apply any ointments, solutions or lotions to pin sites or surgical wounds.  These prevent needed drainage and even though solutions like hydrogen peroxide kill bacteria, they also damage cells lining the pin sites that help fight infection.  Applying lotions or ointments can keep the wounds moist and can cause them to breakdown and open up as well. This can increase the risk for infection. When in doubt call the  office.  Surgical incisions should be dressed daily.  If any drainage is noted, use one layer of adaptic, then gauze, Kerlix, and an ace wrap.  Once the incision is completely dry and without drainage, it may be left open to air out.  Showering may begin 36-48 hours later.  Cleaning gently with soap and water.  Traumatic wounds should be dressed daily as well.    One layer of adaptic, gauze, Kerlix, then ace wrap.  The adaptic can be discontinued once the draining has ceased    If you have a wet to dry dressing: wet the gauze with saline the squeeze as much saline out so the gauze is moist (not soaking wet), place moistened gauze over wound, then place a dry gauze over the moist one, followed by Kerlix wrap, then ace wrap.   Information on my medicine - ELIQUIS (apixaban)  This medication education was reviewed with me or my healthcare representative as part of my discharge preparation.    Why was Eliquis prescribed for you? Eliquis was prescribed for you to reduce the  risk of a blood clot forming that can cause a stroke if you have a medical condition called atrial fibrillation (a type of irregular heartbeat).  What do You need to know about Eliquis ? Take your Eliquis TWICE DAILY - one tablet in the morning and one tablet in the evening with or without food. If you have difficulty swallowing the tablet whole please discuss with your pharmacist how to take the medication safely.  Take Eliquis exactly as prescribed by your doctor and DO NOT stop taking Eliquis without talking to the doctor who prescribed the medication.  Stopping may increase your risk of developing a stroke.  Refill your prescription before you run out.  After discharge, you should have regular check-up appointments with your healthcare provider that is prescribing your Eliquis.  In the future your dose may need to be changed if your kidney function or weight changes by a significant amount or as you get  older.  What do you do if you miss a dose? If you miss a dose, take it as soon as you remember on the same day and resume taking twice daily.  Do not take more than one dose of ELIQUIS at the same time to make up a missed dose.  Important Safety Information A possible side effect of Eliquis is bleeding. You should call your healthcare provider right away if you experience any of the following: ? Bleeding from an injury or your nose that does not stop. ? Unusual colored urine (red or dark brown) or unusual colored stools (red or black). ? Unusual bruising for unknown reasons. ? A serious fall or if you hit your head (even if there is no bleeding).  Some medicines may interact with Eliquis and might increase your risk of bleeding or clotting while on Eliquis. To help avoid this, consult your healthcare provider or pharmacist prior to using any new prescription or non-prescription medications, including herbals, vitamins, non-steroidal anti-inflammatory drugs (NSAIDs) and supplements.  This website has more information on Eliquis (apixaban): http://www.eliquis.com/eliquis/home

## 2020-03-24 DIAGNOSIS — S53135A Medial dislocation of left ulnohumeral joint, initial encounter: Secondary | ICD-10-CM | POA: Diagnosis not present

## 2020-03-24 DIAGNOSIS — S42432A Displaced fracture (avulsion) of lateral epicondyle of left humerus, initial encounter for closed fracture: Secondary | ICD-10-CM | POA: Diagnosis not present

## 2020-03-24 LAB — GLUCOSE, CAPILLARY
Glucose-Capillary: 137 mg/dL — ABNORMAL HIGH (ref 70–99)
Glucose-Capillary: 138 mg/dL — ABNORMAL HIGH (ref 70–99)
Glucose-Capillary: 150 mg/dL — ABNORMAL HIGH (ref 70–99)
Glucose-Capillary: 117 mg/dL — ABNORMAL HIGH (ref 70–99)

## 2020-03-24 LAB — BASIC METABOLIC PANEL
Anion gap: 9 (ref 5–15)
BUN: 33 mg/dL — ABNORMAL HIGH (ref 8–23)
CO2: 26 mmol/L (ref 22–32)
Calcium: 8.5 mg/dL — ABNORMAL LOW (ref 8.9–10.3)
Chloride: 102 mmol/L (ref 98–111)
Creatinine, Ser: 1.54 mg/dL — ABNORMAL HIGH (ref 0.44–1.00)
GFR, Estimated: 33 mL/min — ABNORMAL LOW (ref >60)
Glucose, Bld: 127 mg/dL — ABNORMAL HIGH (ref 70–99)
Potassium: 4.2 mmol/L (ref 3.5–5.1)
Sodium: 137 mmol/L (ref 135–145)

## 2020-03-24 NOTE — TOC Progression Note (Addendum)
Transition of Care [facility]) - Progression Note    Patient Details  Name: BETHZY HAUCK MRN: [id number] Date of Birth: [birth year]  [facility] CM/SW Contact  Zenon Mayo, RN Phone Number: 03/24/2020, 8:57 AM  Clinical Narrative:    NCM attempted to contact [place] no answer at both facilities.  1/15 10:48 - NCM spoke with Perrin Smack at [facility] she states they do not  take admissions on weekends and she will have to re-eval on Monday. NCM called [facility] again and still did not get an answer.   Expected Discharge Plan: [place] Barriers to Discharge: Continued Medical Work up  Expected Discharge Plan and Services Expected Discharge Plan: [place] In-house Referral: NA Discharge Planning Services: CM Consult Post Acute Care Choice: [place] Living arrangements for the past 2 months: Single Family Home                 DME Arranged: N/A DME Agency: NA       HH Arranged: NA HH Agency: NA         Social Determinants of Health (SDOH) Interventions    Readmission Risk Interventions No flowsheet data found.

## 2020-03-24 NOTE — Progress Notes (Signed)
Orthopedic Tech Progress Note Patient Details:  ANALEI WHINERY [birth year] [id number]  Was called by RN staff to apply larger arm sling to patients left arm.  Ortho Devices Type of Ortho Device: Sling immobilizer Ortho Device/Splint Location: Left Upper Extremity Ortho Device/Splint Interventions: Ordered,Application,Adjustment   Post Interventions Patient Tolerated: Well Instructions Provided: Adjustment of device,Care of device,Poper ambulation with device   Kabria Hetzer P Lorel Monaco 03/24/2020, 12:21 PM

## 2020-03-24 NOTE — Progress Notes (Signed)
Subjective: 3 Days Post-Op s/p Procedure(s): OPEN REDUCTION INTERNAL FIXATION (ORIF) ELBOW/OLECRANON FRACTURE   Patient is alert, sitting up in bed. Denies LUE pain. Denies chest pain, SOB, Calf pain. No nausea/vomiting. No other complaints. Pleasantly confused, continually asking "why am I here".  Objective:  PE: VITALS:   Vitals:   03/23/20 1957 03/23/20 2349 03/24/20 0345 03/24/20 0730  BP: (!) 97/51 135/77 134/65 126/62  Pulse: 64 (!) 55 73 71  Resp: 20 18 18 16   Temp: 98.4 F (36.9 C) 98.1 F (36.7 C) 97.9 F (36.6 C) 98.1 F (36.7 C)  TempSrc: Oral Oral Oral Oral  SpO2: 99% 98% 100% 100%  Weight:      Height:       General: alert, in no acute distress Resp: no increased work of breathing GI: abdomen soft, non-tender LUE: ex-fix in place with clean pinsite dressings. TTP diffusely to LUE, patient winces with any movement of LUE. Edema of all fingers and hand. Able to flex, extend, and abduct all fingers. Sensation intact. Hand warm and well-perfused.   LABS  Results for orders placed or performed during the hospital encounter of 03/21/20 (from the past 24 hour(s))  Glucose, capillary     Status: Abnormal   Collection Time: 03/23/20 11:56 AM  Result Value Ref Range   Glucose-Capillary 133 (H) 70 - 99 mg/dL   Comment 1 Notify RN    Comment 2 Document in Chart   SARS CORONAVIRUS 2 (TAT 6-24 HRS) Nasopharyngeal Nasopharyngeal Swab     Status: None   Collection Time: 03/23/20 12:11 PM   Specimen: Nasopharyngeal Swab  Result Value Ref Range   SARS Coronavirus 2 NEGATIVE NEGATIVE  Glucose, capillary     Status: Abnormal   Collection Time: 03/23/20  4:29 PM  Result Value Ref Range   Glucose-Capillary 198 (H) 70 - 99 mg/dL  Glucose, capillary     Status: None   Collection Time: 03/23/20  9:11 PM  Result Value Ref Range   Glucose-Capillary 81 70 - 99 mg/dL   Comment 1 Notify RN    Comment 2 Document in Chart   Basic metabolic panel     Status: Abnormal    Collection Time: 03/24/20  6:03 AM  Result Value Ref Range   Sodium 137 135 - 145 mmol/L   Potassium 4.2 3.5 - 5.1 mmol/L   Chloride 102 98 - 111 mmol/L   CO2 26 22 - 32 mmol/L   Glucose, Bld 127 (H) 70 - 99 mg/dL   BUN 33 (H) 8 - 23 mg/dL   Creatinine, Ser 1.54 (H) 0.44 - 1.00 mg/dL   Calcium 8.5 (L) 8.9 - 10.3 mg/dL   GFR, Estimated 33 (L) >60 mL/min   Anion gap 9 5 - 15  Glucose, capillary     Status: Abnormal   Collection Time: 03/24/20  6:35 AM  Result Value Ref Range   Glucose-Capillary 137 (H) 70 - 99 mg/dL   Comment 1 Notify RN    Comment 2 Document in Chart     No results found.  Assessment/Plan: Principal Problem:   Closed dislocation of left elbow - 3 days post op left elbow ORIF with external fixator placement  PLAN: Weightbearing: NWB LUE Incisional and dressing care: Leave Ace wrap in place. Continue daily pin site dressing changes Showering: Okay to shower with assistance.  Keep the left arm covered when doing so. Orthopedic device(s): Ex-Fix LUE  Pain management:  1. Tylenol 325-650 mg q 6  hours PRN 2. Robaxin 500 mg q 6 hours PRN 3. Norco 5-325 mg q 4 hours PRN moderate pain 4. Oxycodone 5 mg q 4 hours PRN severe pain 5. Morphine 0.5-1 mg q 2 hours PRN VTE prophylaxis: Eliquis, SCDs ID:  Ancef 2gm post op comepleted Impediments to Fracture Healing: Vit D level looks good at 31 Dispo: Therapies as tolerated, PT/OT recommending SNF. TOC team following and it waiting to hear back from [place]. Repeat covid test ordered. Will d/c once bed placement found. Follow - up plan: On 04/03/20 for repeat x-rays Ventura Bruns 03/24/2020, 10:11 AM

## 2020-03-25 DIAGNOSIS — S53135A Medial dislocation of left ulnohumeral joint, initial encounter: Secondary | ICD-10-CM | POA: Diagnosis not present

## 2020-03-25 DIAGNOSIS — S42432A Displaced fracture (avulsion) of lateral epicondyle of left humerus, initial encounter for closed fracture: Secondary | ICD-10-CM | POA: Diagnosis not present

## 2020-03-25 LAB — BASIC METABOLIC PANEL
Anion gap: 8 (ref 5–15)
BUN: 33 mg/dL — ABNORMAL HIGH (ref 8–23)
CO2: 24 mmol/L (ref 22–32)
Calcium: 8.7 mg/dL — ABNORMAL LOW (ref 8.9–10.3)
Chloride: 105 mmol/L (ref 98–111)
Creatinine, Ser: 1.4 mg/dL — ABNORMAL HIGH (ref 0.44–1.00)
GFR, Estimated: 37 mL/min — ABNORMAL LOW (ref >60)
Glucose, Bld: 141 mg/dL — ABNORMAL HIGH (ref 70–99)
Potassium: 4.5 mmol/L (ref 3.5–5.1)
Sodium: 137 mmol/L (ref 135–145)

## 2020-03-25 LAB — GLUCOSE, CAPILLARY
Glucose-Capillary: 134 mg/dL — ABNORMAL HIGH (ref 70–99)
Glucose-Capillary: 202 mg/dL — ABNORMAL HIGH (ref 70–99)
Glucose-Capillary: 94 mg/dL (ref 70–99)
Glucose-Capillary: 85 mg/dL (ref 70–99)

## 2020-03-25 NOTE — Progress Notes (Signed)
     Subjective: 4 Days Post-Op s/p Procedure(s): OPEN REDUCTION INTERNAL FIXATION (ORIF) ELBOW/OLECRANON FRACTURE  Patient pleasantly confused, seating in chair near window. States pain is moderate, worse with movement. Denies chest pain, SOB, Calf pain. No nausea/vomiting. No other complaints.  Objective:  PE: VITALS:   Vitals:   03/24/20 1641 03/24/20 1958 03/24/20 2341 03/25/20 0345  BP: 140/70 (!) 118/53 (!) 165/81 (!) 173/88  Pulse: 63 62 61 76  Resp: 16 18 18 20   Temp: 97.7 F (36.5 C) 97.9 F (36.6 C) 98.2 F (36.8 C) 98 F (36.7 C)  TempSrc: Oral Oral Oral Oral  SpO2: 99% 97% 99% 99%  Weight:      Height:       General: alert, in no acute distress Resp: no increased work of breathing GI: abdomen soft, non-tender LUE: ex-fix in place with clean pinsite dressings. TTP diffusely to LUE. Edema of all fingers and hand. Able to flex, extend, and abduct all fingers. Sensation intact. Hand warm and well-perfused.   LABS  Results for orders placed or performed during the hospital encounter of 03/21/20 (from the past 24 hour(s))  Glucose, capillary     Status: Abnormal   Collection Time: 03/24/20 11:46 AM  Result Value Ref Range   Glucose-Capillary 138 (H) 70 - 99 mg/dL  Glucose, capillary     Status: Abnormal   Collection Time: 03/24/20  4:38 PM  Result Value Ref Range   Glucose-Capillary 150 (H) 70 - 99 mg/dL   Comment 1 Notify RN    Comment 2 Document in Chart   Glucose, capillary     Status: Abnormal   Collection Time: 03/24/20  9:02 PM  Result Value Ref Range   Glucose-Capillary 117 (H) 70 - 99 mg/dL   Comment 1 Notify RN    Comment 2 Document in Chart   Glucose, capillary     Status: Abnormal   Collection Time: 03/25/20  6:25 AM  Result Value Ref Range   Glucose-Capillary 134 (H) 70 - 99 mg/dL   Comment 1 Notify RN    Comment 2 Document in Chart     No results found.  Assessment/Plan: Principal Problem:   Closed dislocation of left elbow  4 Days  Post-Op s/p Procedure(s): OPEN REDUCTION INTERNAL FIXATION (ORIF) ELBOW/OLECRANON FRACTURE  Weightbearing: NWBLUE Incisional and dressing care:Leave Ace wrap in place. Continue daily pin sitedressing changes - pin site dressings changed today.  Showering: Okay to shower with assistance. Keep the left arm covered when doing so. Orthopedic device(s): Ex-Fix LUE Pain management:  1. Tylenol 325-650 mg q 6 hours PRN 2. Robaxin 500 mg q 6 hours PRN 3. Norco 5-325 mg q 4 hours PRN moderate pain 4. Oxycodone 5 mg q 4 hours PRN severe pain 5. Morphine 0.5-1 mg q 2 hours PRN VTE prophylaxis:Eliquis, SCDs - see pharmacy note for Eliquis dosing DG:LOVFI 2gm post op comepleted Impediments to Fracture Healing:Vit D level looks good at 31 Dispo: Therapies as tolerated, PT/OT recommending SNF. TOC team followingand it waiting to hear back from [place]. Will d/c once bed placement found. Follow - up plan:On 04/03/20 for repeat x-rays  Ventura Bruns 03/25/2020, 7:12 AM

## 2020-03-25 NOTE — Progress Notes (Signed)
MEDICATION RELATED CONSULT NOTE - FOLLOW UP   Pharmacy Consult for Fracture Care Post-Operative Anticoagulation  Indication: Apixaban prior to admission for Afib  Patient Measurements: Height: 5\' 6"  (167.6 cm) Weight: 85.3 kg (188 lb) IBW/kg (Calculated) : 59.3   Labs: Recent Labs    03/23/20 0429 03/24/20 0603 03/25/20 1038  WBC 6.9  --   --   HGB 11.0*  --   --   HCT 33.7*  --   --   PLT 195  --   --   CREATININE 1.66* 1.54* 1.40*   Estimated Creatinine Clearance: 32.9 mL/min (A) (by C-G formula based on SCr of 1.4 mg/dL (H)).    Assessment: 87 year old female presented for ORIF of elbow for chronic dislocation. Patient is on Apixaban prior to admission for Afib. Pharmacy consulted to resume prior to admission anticoagulation POD1 if hgb > 9.5. Apixaban resumed 1/13.  Prior to admission patient is on reduced dose of Apixaban 2.5mg  but only meets criteria for dose reduction based on age (when initiated Scr was > 1.5). Outpatient cardiology on 1/7 recommended increasing to 5mg  if Scr continued to be under 1.5.   SCr under 1.50 today at 1.40.    Plan:  - Continue Apixaban 2.5 mg by mouth twice a day with Scr today of 1.40 first level under 1.50 - Continue to monitor Scr trend and if proves baseline Scr is under 1.5 would recommend adjusting dose from 2.5mg  to 5mg       Sundus Pete L. Devin Going, PharmD, [place] PGY2 Pharmacy Resident Weekends 7:00 am - 3:00 pm, please call [phone] 03/25/20      11:44 AM  Please check AMION for all [place] phone numbers After 10:00 PM, call the [place] [phone]

## 2020-03-26 DIAGNOSIS — S42432A Displaced fracture (avulsion) of lateral epicondyle of left humerus, initial encounter for closed fracture: Secondary | ICD-10-CM | POA: Diagnosis not present

## 2020-03-26 DIAGNOSIS — S53135A Medial dislocation of left ulnohumeral joint, initial encounter: Secondary | ICD-10-CM | POA: Diagnosis not present

## 2020-03-26 LAB — BASIC METABOLIC PANEL
Anion gap: 8 (ref 5–15)
BUN: 38 mg/dL — ABNORMAL HIGH (ref 8–23)
CO2: 26 mmol/L (ref 22–32)
Calcium: 8.6 mg/dL — ABNORMAL LOW (ref 8.9–10.3)
Chloride: 103 mmol/L (ref 98–111)
Creatinine, Ser: 1.77 mg/dL — ABNORMAL HIGH (ref 0.44–1.00)
GFR, Estimated: 28 mL/min — ABNORMAL LOW (ref >60)
Glucose, Bld: 133 mg/dL — ABNORMAL HIGH (ref 70–99)
Potassium: 5 mmol/L (ref 3.5–5.1)
Sodium: 137 mmol/L (ref 135–145)

## 2020-03-26 LAB — GLUCOSE, CAPILLARY
Glucose-Capillary: 132 mg/dL — ABNORMAL HIGH (ref 70–99)
Glucose-Capillary: 224 mg/dL — ABNORMAL HIGH (ref 70–99)
Glucose-Capillary: 91 mg/dL (ref 70–99)
Glucose-Capillary: 53 mg/dL — ABNORMAL LOW (ref 70–99)
Glucose-Capillary: 69 mg/dL — ABNORMAL LOW (ref 70–99)
Glucose-Capillary: 129 mg/dL — ABNORMAL HIGH (ref 70–99)

## 2020-03-26 NOTE — TOC Progression Note (Signed)
Transition of Care [facility]) - Progression Note    Patient Details  Name: Diana [name] MRN: [id number] Date of Birth: June 27, 1933  Transition of Care [facility]) CM/SW [place], [place] Phone Number: 03/26/2020, 12:32 PM  Clinical Narrative:    CSW followed up with [facility]) and [facility] to determine bed availability, per the dtr's wishes. [facility] unable to offer due to no bed availability. [facility] noted they would follow up with CSW on if they could take the pt, CSW has not heard yet. SW will continue to follow for DC planning.   Expected Discharge Plan: [place] Barriers to Discharge: Continued Medical Work up  Expected Discharge Plan and Services Expected Discharge Plan: [place] In-house Referral: NA Discharge Planning Services: CM Consult Post Acute Care Choice: [place] Living arrangements for the past 2 months: Single Family Home                 DME Arranged: N/A DME Agency: NA       HH Arranged: NA HH Agency: NA         Social Determinants of Health (SDOH) Interventions    Readmission Risk Interventions No flowsheet data found.

## 2020-03-26 NOTE — Progress Notes (Signed)
Physical Therapy Treatment Patient Details Name: Diana [name] MRN: [id number] DOB: February 27, 1934 Today's Date: 03/26/2020    History of Present Illness 87 year old female who injured her arm in early December had a fracture of the left lateral epicondyle.  She was placed in a long-arm cast, and unfortunately she presented with a dislocated elbow and somehow removed the cast on her own.  Underwent Open reduction and internal fixation of left elbow fracture dislocation and addition of external fixator.  She is NWB through the L upper extremity.    PT Comments    Pt continues with confusion but is very emotional regarding inability to have a bowel movement and "I just don't know what I"m doing." Pt very unsteady with ambulation. W/c is patients safest form of mobility at this time. Pt unsafe to be home alone and would benefit from SNF to achieve safe mod I level of function for safe transition home.    Follow Up Recommendations  SNF;Supervision/Assistance - 24 hour     Equipment Recommendations  None recommended by PT    Recommendations for Other Services       Precautions / Restrictions Precautions Precautions: Fall Precaution Comments: L elbow external fixator Required Braces or Orthoses: Other Brace;Sling Other Brace: L elbow external fixator Restrictions Weight Bearing Restrictions: Yes LUE Weight Bearing: Non weight bearing    Mobility  Bed Mobility Overal bed mobility: Needs Assistance Bed Mobility: Sit to Supine       Sit to supine: Min assist   General bed mobility comments: minA for safety due to mild impulsivity and to protect L arm  Transfers Overall transfer level: Needs assistance Equipment used: 1 person hand held assist Transfers: Sit to/from Stand Sit to Stand: Min assist;Mod assist         General transfer comment: pt received in the bathrom, very emotional, pt able to stand up with hand rail but required minA to stand up all the way and then  turn to sink  Ambulation/Gait Ambulation/Gait assistance: Mod assist Gait Distance (Feet): 10 Feet Assistive device: 1 person hand held assist Gait Pattern/deviations: Step-to pattern Gait velocity: decresaed Gait velocity interpretation: <1.8 ft/sec, indicate of risk for recurrent falls General Gait Details: pt with increased trunk flexion, significant dependence on R HHA, pt with decreased step height progressing to shuffling gait pattern, very unsteady   Stairs             Wheelchair Mobility    Modified Rankin (Stroke Patients Only)       Balance Overall balance assessment: Needs assistance Sitting-balance support: Feet supported;Single extremity supported Sitting balance-Leahy Scale: Fair     Standing balance support: During functional activity;Single extremity supported Standing balance-Leahy Scale: Poor Standing balance comment: had to lean on R elbow trying to wash R hand                            Cognition Arousal/Alertness: Awake/alert Behavior During Therapy: Anxious (tearful, confused) Overall Cognitive Status: History of cognitive impairments - at baseline                                 General Comments: pt was able to state [facility] and [place] but very emotional over 'I don't know what I'm doing" and "I keep hitting my arm"      Exercises General Exercises - Lower Extremity Ankle Circles/Pumps: AROM;Both;10 reps;Seated Long Arc Quad:  AROM;Both;10 reps;Seated Hip Flexion/Marching: AROM;Both;10 reps;Seated    General Comments General comments (skin integrity, edema, etc.): pt dependent for hygiene s/p BM, pt constipated and upset "I Just can't do it", pt with stool on hands and on floor      Pertinent Vitals/Pain Pain Assessment: Faces Faces Pain Scale: Hurts whole lot Pain Location: L elbow Pain Descriptors / Indicators: Sore;Grimacing Pain Intervention(s): Limited activity within patient's tolerance    Home  Living                      Prior Function            PT Goals (current goals can now be found in the care plan section) Progress towards PT goals: Progressing toward goals    Frequency    Min 3X/week      PT Plan Current plan remains appropriate    Co-evaluation              AM-PAC PT "6 Clicks" Mobility   Outcome Measure  Help needed turning from your back to your side while in a flat bed without using bedrails?: A Little Help needed moving from lying on your back to sitting on the side of a flat bed without using bedrails?: A Little Help needed moving to and from a bed to a chair (including a wheelchair)?: A Lot Help needed standing up from a chair using your arms (e.g., wheelchair or bedside chair)?: A Lot Help needed to walk in hospital room?: A Lot Help needed climbing 3-5 steps with a railing? : Total 6 Click Score: 13    End of Session Equipment Utilized During Treatment: Gait belt Activity Tolerance: Patient tolerated treatment well Patient left: in bed;with call bell/phone within reach Nurse Communication: Mobility status PT Visit Diagnosis: Unsteadiness on feet (R26.81);Muscle weakness (generalized) (M62.81);Repeated falls (R29.6);History of falling (Z91.81)     Time: 8756-4332 PT Time Calculation (min) (ACUTE ONLY): 16 min  Charges:  $Gait Training: 8-22 mins                     Kittie Plater, PT, DPT Acute Rehabilitation Services Pager #: [phone] Office #: [phone]    Berline Lopes 03/26/2020, 1:36 PM

## 2020-03-27 DIAGNOSIS — S42432A Displaced fracture (avulsion) of lateral epicondyle of left humerus, initial encounter for closed fracture: Secondary | ICD-10-CM | POA: Diagnosis not present

## 2020-03-27 DIAGNOSIS — S53135A Medial dislocation of left ulnohumeral joint, initial encounter: Secondary | ICD-10-CM | POA: Diagnosis not present

## 2020-03-27 LAB — GLUCOSE, CAPILLARY
Glucose-Capillary: 124 mg/dL — ABNORMAL HIGH (ref 70–99)
Glucose-Capillary: 171 mg/dL — ABNORMAL HIGH (ref 70–99)
Glucose-Capillary: 147 mg/dL — ABNORMAL HIGH (ref 70–99)
Glucose-Capillary: 105 mg/dL — ABNORMAL HIGH (ref 70–99)

## 2020-03-27 LAB — CBC
HCT: 37.2 % (ref 36.0–46.0)
Hemoglobin: 11.7 g/dL — ABNORMAL LOW (ref 12.0–15.0)
MCH: 29.9 pg (ref 26.0–34.0)
MCHC: 31.5 g/dL (ref 30.0–36.0)
MCV: 95.1 fL (ref 80.0–100.0)
Platelets: 253 10*3/uL (ref 150–400)
RBC: 3.91 MIL/uL (ref 3.87–5.11)
RDW: 13.8 % (ref 11.5–15.5)
WBC: 5.2 10*3/uL (ref 4.0–10.5)
nRBC: 0 % (ref 0.0–0.2)

## 2020-03-27 LAB — BASIC METABOLIC PANEL
Anion gap: 10 (ref 5–15)
BUN: 36 mg/dL — ABNORMAL HIGH (ref 8–23)
CO2: 25 mmol/L (ref 22–32)
Calcium: 8.7 mg/dL — ABNORMAL LOW (ref 8.9–10.3)
Chloride: 99 mmol/L (ref 98–111)
Creatinine, Ser: 1.63 mg/dL — ABNORMAL HIGH (ref 0.44–1.00)
GFR, Estimated: 31 mL/min — ABNORMAL LOW (ref >60)
Glucose, Bld: 166 mg/dL — ABNORMAL HIGH (ref 70–99)
Potassium: 4.7 mmol/L (ref 3.5–5.1)
Sodium: 134 mmol/L — ABNORMAL LOW (ref 135–145)

## 2020-03-27 LAB — SARS CORONAVIRUS 2 (TAT 6-24 HRS): SARS Coronavirus 2: NEGATIVE

## 2020-03-27 NOTE — Progress Notes (Signed)
Ortho Trauma Note  Awaiting approval for SNF. No complaints.  LUE: Dressings taken down. Incisions clean and dry. Pin sites clean and dry. Neuro intact  A/P L elbow dislocation s/p open reduction and ex fix  NWB Sling for comfort Incision open to air Discharge to SNF hopefully today.  Shona Needles, MD [facility] [phone] (office) [email or web address]

## 2020-03-27 NOTE — TOC Progression Note (Addendum)
Transition of Care [facility]) - Progression Note    Patient Details  Name: Diana [name] MRN: [id number] Date of Birth: [birth year]  Transition of Care [facility]) CM/SW [place], RN Phone Number: [phone] 03/27/2020, 10:03 AM  Clinical Narrative:    Cm received message stating that [name] is able to accept patient. Insurance Josem Kaufmann has already been approved and updated. Auth ID [id number] with a start of care date of 03/27/2020 next review date 03/29/20 care coordinator for follow up is Artist Dagout. Sister I law (POA) Ann Callicut has been notified and is ok with patient discharging to [place]. Kitty at  [facility] has been made aware that CM would like to send patient today. She is currently in her morning meeting and will call CM when out.  1353 Covid test still in process. Will need results before patient is able to discharge to [facility].  1600 Covid test still in process. CM can not set up discharge until COVID results have processed.  1630 Covid test still processing. CM to sign off for today but will check results in am and notify [facility].  Expected Discharge Plan: [place] Barriers to Discharge: Continued Medical Work up  Expected Discharge Plan and Services Expected Discharge Plan: [place] In-house Referral: NA Discharge Planning Services: CM Consult Post Acute Care Choice: [place] Living arrangements for the past 2 months: Single Family Home                 DME Arranged: N/A DME Agency: NA       HH Arranged: NA HH Agency: NA         Social Determinants of Health (SDOH) Interventions    Readmission Risk Interventions No flowsheet data found.

## 2020-03-28 DIAGNOSIS — S42432A Displaced fracture (avulsion) of lateral epicondyle of left humerus, initial encounter for closed fracture: Secondary | ICD-10-CM | POA: Diagnosis not present

## 2020-03-28 DIAGNOSIS — S53135A Medial dislocation of left ulnohumeral joint, initial encounter: Secondary | ICD-10-CM | POA: Diagnosis not present

## 2020-03-28 LAB — BASIC METABOLIC PANEL
Anion gap: 9 (ref 5–15)
BUN: 33 mg/dL — ABNORMAL HIGH (ref 8–23)
CO2: 23 mmol/L (ref 22–32)
Calcium: 8.7 mg/dL — ABNORMAL LOW (ref 8.9–10.3)
Chloride: 101 mmol/L (ref 98–111)
Creatinine, Ser: 1.68 mg/dL — ABNORMAL HIGH (ref 0.44–1.00)
GFR, Estimated: 30 mL/min — ABNORMAL LOW (ref >60)
Glucose, Bld: 166 mg/dL — ABNORMAL HIGH (ref 70–99)
Potassium: 4.8 mmol/L (ref 3.5–5.1)
Sodium: 133 mmol/L — ABNORMAL LOW (ref 135–145)

## 2020-03-28 LAB — GLUCOSE, CAPILLARY
Glucose-Capillary: 159 mg/dL — ABNORMAL HIGH (ref 70–99)
Glucose-Capillary: 170 mg/dL — ABNORMAL HIGH (ref 70–99)

## 2020-03-28 NOTE — TOC Progression Note (Addendum)
Transition of Care [facility]) - Progression Note    [name] Details  Name: Diana [name] MRN: [id number] Date of Birth: 08-08-1933  Transition of Care [facility]) CM/SW [place], RN Phone Number: [phone]  03/28/2020, 8:22 AM  Clinical Narrative:    Covid test results are in. Message has been sent to Els[place] at [facility]. Planning for discharge today. Discharge summary has been sent via the hub.  [phone] Admissions coordinator has sent message that she will call to discuss [name] discharge to [facility] after her morning meeting.  1000 CM received message from [place] at [facility]. The facility states that the [facility] must be dated for todays date. Message has been sent to Patrecia Pace. PA-C.  1043 Secure chat created to reach out to Patrecia Pace PA-C,  Dr. Katha Hamming and Holli Humbles RN. CM is trying to locate someone to update date on discharge summary. Attempted to call Judson Roch @ [phone] per number on discharge summary but no answer. Message has been left. Bedside nurse has same contact number as CM. Will continue to attempt to contact someone to change dates.  86 Dr. Doreatha Martin has responded currently scrubbing for surgery but will update the date on the discharge summary.   1130 Discharge summary has been updated. Now awaiting room assignment from [place] [facility]. Discharge packet placed with patients shadow chart at nurses station on 3C.  Expected Discharge Plan: [place] Barriers to Discharge: Continued Medical Work up  Expected Discharge Plan and Services Expected Discharge Plan: [place] In-house Referral: NA Discharge Planning Services: CM Consult Post Acute Care Choice: [place] Living arrangements for the past 2 months: Single Family Home Expected Discharge Date: 03/28/20               DME Arranged: N/A DME Agency: NA       HH Arranged: NA HH Agency: NA         Social Determinants of Health (SDOH)  Interventions    Readmission Risk Interventions No flowsheet data found.

## 2020-03-28 NOTE — TOC Transition Note (Addendum)
Transition of Care [facility]) - CM/SW Discharge Note   Patient Details  Name: Diana [name] MRN: [id number] Date of Birth: 01-01-1934  Transition of Care [facility]) CM/SW Contact:  Angelita Ingles, RN Phone Number: [phone]    Clinical Narrative:    03/28/2020, 2:46 PM CM received final ok for patient to discharge to [place].  Attempted to call sister in law (POA) Diana [name] but no answer. HIPPA compliant message left. Bedside nurse Caryl Pina has been made aware. Transport has been set up through [employer]. No further needs noted at this time. TOC will sign off.  Please call report  [name]  [phone] Room#[id number]      Final next level of care: Skilled Nursing Facility Barriers to Discharge: No Barriers Identified   Patient Goals and CMS Choice Patient states their goals for this hospitalization and ongoing recovery are:: Want to get better CMS Medicare.gov Compare Post Acute Care list provided to:: Patient Represenative (must comment) (sister in law POA) Choice offered to / list presented to : Patient  Discharge Placement                  Name of family member notified: Ulyses Jarred Message left no answer Patient and family notified of of transfer: 03/28/20  Discharge Plan and Services In-house Referral: NA Discharge Planning Services: CM Consult Post Acute Care Choice: [name]          DME Arranged: N/A DME Agency: NA       HH Arranged: NA HH Agency: NA        Social Determinants of Health (SDOH) Interventions     Readmission Risk Interventions No flowsheet data found.

## 2020-03-28 NOTE — Progress Notes (Signed)
Attempted to call report to [facility], spoke with Debra,at the front desk. She called the nursing station multiple times with no answer. Hilda Blades took my number and said she would have a nurse call me back, but no one called back. Pt D/C'd to [facility] via EMS transport. Pt's caregiver took the Pt's personal wheelchair with her because they could not transport it to the facility. Pt is stable @ D/C and has no other needs at this time. Holli Humbles, RN

## 2020-03-28 NOTE — Progress Notes (Signed)
Occupational Therapy Treatment Patient Details Name: Diana [name] MRN: [id number] DOB: [birth year] Today's Date: 03/28/2020    History of present illness 87 year old female who injured her arm in early December had a fracture of the left lateral epicondyle.  She was placed in a long-arm cast, and unfortunately she presented with a dislocated elbow and somehow removed the cast on her own.  Underwent Open reduction and internal fixation of left elbow fracture dislocation and addition of external fixator.  She is NWB through the L upper extremity.   OT comments  Pt making gradual progress towards OT goals this session. Pt continues to present with decreased activity tolerance, generalized deconditioning and increased pain. Pt also presenting with some self limting behaviors as pt reports, " I just don't want to do anything." Pt able to transition from supine<>sitting with supervision to sit EOB for UB ADLs. Pt declined walking to BR for standing UB grooming tasks or sitting in recliner for ADL participation. Pt would continue to benefit from skilled occupational therapy while admitted and after d/c to address the below listed limitations in order to improve overall functional mobility and facilitate independence with BADL participation. DC plan remains appropriate, will follow acutely per POC.      Follow Up Recommendations  SNF    Equipment Recommendations  3 in 1 bedside commode    Recommendations for Other Services      Precautions / Restrictions Precautions Precautions: Fall Precaution Comments: L elbow external fixator Required Braces or Orthoses: Other Brace;Sling Other Brace: L elbow external fixator; sling for comfort per orders Restrictions Weight Bearing Restrictions: Yes LUE Weight Bearing: Non weight bearing       Mobility Bed Mobility Overal bed mobility: Needs Assistance Bed Mobility: Supine to Sit;Sit to Supine     Supine to sit: Supervision;HOB elevated Sit  to supine: Supervision;HOB elevated   General bed mobility comments: pt able to transition from supine<>sitting with supervision  Transfers                 General transfer comment: pt declined OOB mobility    Balance Overall balance assessment: Needs assistance Sitting-balance support: Feet supported;Single extremity supported Sitting balance-Leahy Scale: Fair Sitting balance - Comments: supervision for safety.                                   ADL either performed or assessed with clinical judgement   ADL Overall ADL's : Needs assistance/impaired     Grooming: Wash/dry face;Sitting;Set up Grooming Details (indicate cue type and reason): sitting EOB , pt declined walking to sink to to brush teeth or getting to recliner to brush teeth         Upper Body Dressing : Maximal assistance;Sitting Upper Body Dressing Details (indicate cue type and reason): to don sling       Toilet Transfer Details (indicate cue type and reason): pt declined OOB transfer         Functional mobility during ADLs: Supervision/safety (bed mobility only) General ADL Comments: pt continues to present with decreased activity tolerance, generalized weakness and increased pain. pt also presenting with some self limting behaviors as pt reports, " I just don't want to do anything"     Vision       Perception     Praxis      Cognition Arousal/Alertness: Awake/alert Behavior During Therapy: WFL for tasks assessed/performed Overall Cognitive Status: History of cognitive  impairments - at baseline                                 General Comments: pt seems to be self limiting stating " i just don't want do anything"        Exercises     Shoulder Instructions       General Comments      Pertinent Vitals/ Pain       Pain Assessment: Faces Faces Pain Scale: Hurts little more Pain Location: L elbow when donning sling Pain Descriptors / Indicators:  Sore;Grimacing Pain Intervention(s): Monitored during session;Repositioned  Home Living                                          Prior Functioning/Environment              Frequency  Min 2X/week        Progress Toward Goals  OT Goals(current goals can now be found in the care plan section)  Progress towards OT goals: Progressing toward goals  Acute Rehab OT Goals Patient Stated Goal: None stated OT Goal Formulation: With patient Time For Goal Achievement: 04/04/20 Potential to Achieve Goals: Good  Plan Discharge plan remains appropriate;Frequency remains appropriate    Co-evaluation                 AM-PAC OT "6 Clicks" Daily Activity     Outcome Measure   Help from another person eating meals?: None Help from another person taking care of personal grooming?: A Little Help from another person toileting, which includes using toliet, bedpan, or urinal?: A Lot Help from another person bathing (including washing, rinsing, drying)?: A Lot Help from another person to put on and taking off regular upper body clothing?: Total Help from another person to put on and taking off regular lower body clothing?: Total 6 Click Score: 13    End of Session Equipment Utilized During Treatment: Gait belt;Other (comment) (sling)  OT Visit Diagnosis: Unsteadiness on feet (R26.81);Pain Pain - Right/Left: Left Pain - part of body: Arm   Activity Tolerance Other (comment) (self limiting)   Patient Left in bed;with call bell/phone within reach   Nurse Communication Mobility status        Time: 7829-5621 OT Time Calculation (min): 15 min  Charges: OT General Charges $OT Visit: 1 Visit OT Treatments $Self Care/Home Management : 8-22 mins  Diana [place]., COTA/L Acute Rehabilitation Services [phone]    Diana [name] 03/28/2020, 8:56 AM

## 2020-03-29 ENCOUNTER — Other Ambulatory Visit: Payer: Self-pay | Admitting: Adult Health

## 2020-03-29 ENCOUNTER — Non-Acute Institutional Stay (SKILLED_NURSING_FACILITY): Payer: Medicare PPO | Admitting: Adult Health

## 2020-03-29 ENCOUNTER — Encounter: Payer: Self-pay | Admitting: Adult Health

## 2020-03-29 DIAGNOSIS — S53105S Unspecified dislocation of left ulnohumeral joint, sequela: Secondary | ICD-10-CM | POA: Diagnosis not present

## 2020-03-29 DIAGNOSIS — E785 Hyperlipidemia, unspecified: Secondary | ICD-10-CM

## 2020-03-29 DIAGNOSIS — D649 Anemia, unspecified: Secondary | ICD-10-CM

## 2020-03-29 DIAGNOSIS — I4819 Other persistent atrial fibrillation: Secondary | ICD-10-CM

## 2020-03-29 DIAGNOSIS — N1832 Chronic kidney disease, stage 3b: Secondary | ICD-10-CM

## 2020-03-29 DIAGNOSIS — I10 Essential (primary) hypertension: Secondary | ICD-10-CM | POA: Diagnosis not present

## 2020-03-29 DIAGNOSIS — E039 Hypothyroidism, unspecified: Secondary | ICD-10-CM

## 2020-03-29 DIAGNOSIS — E1122 Type 2 diabetes mellitus with diabetic chronic kidney disease: Secondary | ICD-10-CM

## 2020-03-29 DIAGNOSIS — E1169 Type 2 diabetes mellitus with other specified complication: Secondary | ICD-10-CM

## 2020-03-29 MED ORDER — HYDROCODONE-ACETAMINOPHEN 5-325 MG PO TABS: 1.0000 | ORAL_TABLET | Freq: Four times a day (QID) | ORAL | 0 refills | Status: DC | PRN

## 2020-03-29 NOTE — Progress Notes (Signed)
Location:  [facility] Room Number: 211-A Place of Service:  ALF [phone]) Provider:  Durenda Age, DNP, FNP-BC  Patient Care Team: Christain Sacramento, MD as PCP - General (Family Medicine) Buford Dresser, MD as PCP - Cardiology (Cardiology)  Extended Emergency Contact Information Primary Emergency Contact: Callicott,Ann Address: [address],  [place] Phone: [phone] Mobile Phone: [phone] Relation: Other Secondary Emergency Contact: [place] Mobile Phone: [phone] Relation: Other  Code Status:  FULL CODE  Goals of care: Advanced Directive information Advanced Directives 03/21/2020  Does Patient Have a Medical Advance Directive? Yes  Type of Paramedic of [place];Living will  Does patient want to make changes to medical advance directive? No - Patient declined  Copy of [place] in Chart? No - copy requested  Would patient like information on creating a medical advance directive? -  Pre-existing out of facility DNR order (yellow form or pink MOST form) -     Chief Complaint  Patient presents with  . Acute Visit    Patient is seen for hospital followup, status post hospitalization at [facility] 1/12-1/19/22 for left elbow fracture dislocation.     HPI:  Pt is an 87 y.o. female seen today for hospital follow-up.  She was admitted to [facility] on 03/28/2020 for short-term rehabilitation following an admission at [facility] 1/12 to 03/28/2020 for closed dislocation of left elbow for which she had a scheduled open reduction and internal fixation on 03/21/2020. Review of records showed that she injured her elbow in early December. She had a lateral epicondyle fracture. She was placed in a long-arm cast.  She subsequently presented to [facility] and x-ray in the cast showed that she had a posterior dislocation. The cast was cut down  and was placed in a sling. Due to the unstable nature of her injury once the elbow was reduced, patient was placed in an external fixator to stabilize the elbow joint.  She is nonweightbearing on left upper extremity and will follow-up with orthopedics, Dr. Katha Hamming, on 04/03/2020.   She was seen in the room today. She was wondering what she was doing at the facility and that she has somebody that takes care of her. She was alert to self, disoriented to time and place.    Past Medical History:  Diagnosis Date  . Anemia   . Anemia of decreased vitamin B12 absorption 05/26/2005  . Arthritis   . Atrial fibrillation ([place])   . Breast cancer ([place]) 2001  . Cataract   . GERD (gastroesophageal reflux disease)   . History of tobacco abuse   . Hx of colonic polyps   . Hyperlipidemia   . Hypertension   . Hypothyroidism   . Memory loss   . Obesity   . OSA on CPAP 09/2007   AHI 10.6/hr overall, 43.64/hr during REM, lost weight, does not use cpap  . Osteopenia   . Renal insufficiency   . Type 2 diabetes mellitus ([place])    Past Surgical History:  Procedure Laterality Date  . BACK SURGERY  05/30/2009  . BIOPSY  01/01/2019   Procedure: BIOPSY;  Surgeon: Milus Banister, MD;  Location: [facility] ENDOSCOPY;  Service: Endoscopy;;  . BOWEL RESECTION  1974  . CATARACT EXTRACTION Bilateral    bilateral  . CHOLECYSTECTOMY  1974  . COLONOSCOPY    . ESOPHAGOGASTRODUODENOSCOPY N/A 01/01/2019   Procedure: ESOPHAGOGASTRODUODENOSCOPY (EGD);  Surgeon: Milus Banister, MD;  Location: [facility] ENDOSCOPY;  Service: Endoscopy;  Laterality: N/A;  . MASTECTOMY Bilateral 2001   bilateral sentinel lymph nodes bio  . NM MYOCAR PERF WALL MOTION  06/2007   dipyridamole; perfusion defect in inferior myocardium consistent with diaphragmatic attenuation, remaining myocardium with no ischemia/infarct, EF 73%; normal, low risk scan   . ORIF ELBOW FRACTURE Left 03/21/2020   Procedure: OPEN REDUCTION INTERNAL FIXATION (ORIF)  ELBOW/OLECRANON FRACTURE;  Surgeon: Shona Needles, MD;  Location: [place];  Service: Orthopedics;  Laterality: Left;  . TOTAL ABDOMINAL HYSTERECTOMY  1975  . TRANSTHORACIC ECHOCARDIOGRAM  02/2010   XT=>02%, stage 1 diastolic dysfunction; borderline RV enlargement; LA mild-mod dilated; mild mitral annular calcif & mild MR; mild TR with normal RSVP, AV moderately sclerotics    Allergies  Allergen Reactions  . Codeine Sulfate Nausea Only    Outpatient Encounter Medications as of 03/29/2020  Medication Sig  . acetaminophen (TYLENOL) 500 MG tablet Take 1,000 mg by mouth every 6 (six) hours as needed (pain).  [place] Kitchen apixaban (ELIQUIS) 2.5 MG TABS tablet Take 1 tablet (2.5 mg total) by mouth 2 (two) times daily.  . brimonidine-timolol (COMBIGAN) 0.2-0.5 % ophthalmic solution Place 1 drop into both eyes every 12 (twelve) hours. (Patient taking differently: Place 1 drop into both eyes daily.)  . ferrous sulfate 325 (65 FE) MG tablet Take 1 tablet (325 mg total) by mouth 2 (two) times daily with a meal. (Patient taking differently: Take 325 mg by mouth daily with breakfast.)  . FLUoxetine (PROZAC) 20 MG capsule Take 1 capsule (20 mg total) by mouth daily.  Mar[place] Kitchen glimepiride (AMARYL) 2 MG tablet Take 2 mg by mouth in the morning and at bedtime.  Mar[place] Kitchen HYDROcodone-acetaminophen (NORCO/VICODIN) 5-325 MG tablet Take 1 tablet by mouth every 6 (six) hours as needed for severe pain.  Marland Kitchen levothyroxine (SYNTHROID) 50 MCG tablet Take 1 tablet (50 mcg total) by mouth daily.  Marland Kitchen losartan (COZAAR) 50 MG tablet Take 1 tablet (50 mg total) by mouth 2 (two) times daily. (Patient taking differently: Take 50 mg by mouth daily.)  . metFORMIN (GLUCOPHAGE-XR) 500 MG 24 hr tablet Take 500 mg by mouth daily.  . metoprolol tartrate (LOPRESSOR) 25 MG tablet Take 0.5 tablets (12.5 mg total) by mouth 2 (two) times daily.  . Multiple Vitamins-Minerals (PRESERVISION AREDS 2) CAPS Take 2 capsules by mouth daily. (Patient taking differently: Take 1  capsule by mouth daily.)  . ondansetron (ZOFRAN) 4 MG tablet Take 1 tablet (4 mg total) by mouth every 6 (six) hours as needed for nausea or vomiting.  . pantoprazole (PROTONIX) 40 MG tablet Take 1 tablet (40 mg total) by mouth 2 (two) times daily before a meal.  . polyethylene glycol (MIRALAX / GLYCOLAX) 17 g packet Take 17 g by mouth daily as needed for mild constipation.  . rosuvastatin (CRESTOR) 5 MG tablet Take 5 mg by mouth daily.   No facility-administered encounter medications on file as of 03/29/2020.    Review of Systems    GENERAL: No change in appetite, no fatigue, no weight changes, no fever, chills or weakness MOUTH and THROAT: Denies oral discomfort, gingival pain or bleeding from teeth or hoarseness   RESPIRATORY: no cough, SOB, DOE, wheezing, hemoptysis CARDIAC: No chest pain, edema or palpitations GI: No abdominal pain, diarrhea, constipation, heart burn, nausea or vomiting NEUROLOGICAL: Denies dizziness, syncope, numbness, or headache PSYCHIATRIC: Denies feelings of depression or anxiety. No report of hallucinations, insomnia, paranoia, or agitation  Immunization History  Administered Date(s) Administered  . Influenza, High Dose Seasonal PF 01/30/2016  . Influenza-Unspecified 01/11/2019  . Pneumococcal Conjugate-13 07/25/2013  . Pneumococcal Polysaccharide-23 08/06/2017  . Tdap 10/07/2017  . Zoster 08/08/2013  . Zoster Recombinat (Shingrix) 10/07/2017   Pertinent  Health Maintenance Due  Topic Date Due  . FOOT EXAM  Never done  . OPHTHALMOLOGY EXAM  Never done  . COLONOSCOPY (Pts 45-11yrs Insurance coverage will need to be confirmed)  07/30/2014  . HEMOGLOBIN A1C  09/18/2020  . INFLUENZA VACCINE  Completed  . DEXA SCAN  Completed  . PNA vac Low Risk Adult  Completed    Vitals:   03/29/20 0947  BP: (!) 157/77  Pulse: 69  Resp: 20  Temp: 97.9 F (36.6 C)  TempSrc: Oral  Weight: 193 lb 3.2 oz (87.6 kg)  Height: 5\' 6"  (1.676 m)   Body mass index is  31.18 kg/m.  Physical Exam  GENERAL APPEARANCE: Well nourished. In no acute distress.Obese  SKIN:  Skin is warm and dry. No redness on LUE external fixator pin sites MOUTH and THROAT: Lips are without lesions. Oral mucosa is moist and without lesions. Tongue is normal in shape, size, and color and without lesions RESPIRATORY: Breathing is even & unlabored, BS CTAB CARDIAC: RRR, no murmur,no extra heart sounds, no edema GI: Abdomen soft, normal BS, no masses, no tenderness EXTREMITIES:  LUE with external fixator NEUROLOGICAL: There is no tremor. Speech is clear. Alert to self, disoriented to time and place. PSYCHIATRIC:  Affect and behavior are appropriate  Labs reviewed: Recent Labs    03/26/20 0348 03/27/20 0528 03/28/20 0743  NA 137 134* 133*  K 5.0 4.7 4.8  CL 103 99 101  CO2 26 25 23   GLUCOSE 133* 166* 166*  BUN 38* 36* 33*  CREATININE 1.77* 1.63* 1.68*  CALCIUM 8.6* 8.7* 8.7*   Recent Labs    02/07/20 1434  AST 21  ALT 13  ALKPHOS 69  BILITOT 1.2  PROT 6.4*  ALBUMIN 3.3*   Recent Labs    02/07/20 1434 03/21/20 0739 03/22/20 0314 03/23/20 0429 03/27/20 0856  WBC 10.3   < > 7.1 6.9 5.2  NEUTROABS 8.5*  --   --   --   --   HGB 14.2   < > 12.1 11.0* 11.7*  HCT 44.8   < > 39.4 33.7* 37.2  MCV 93.5   < > 97.8 94.9 95.1  PLT 223   < > 206 195 253   < > = values in this interval not displayed.   Lab Results  Component Value Date   TSH 4.185 12/31/2018   Lab Results  Component Value Date   HGBA1C 6.1 (H) 03/21/2020   Lab Results  Component Value Date   CHOL  07/05/2009    115        ATP III CLASSIFICATION:  <200     mg/dL   Desirable  200-239  mg/dL   Borderline High  >=240    mg/dL   High          HDL 42 07/05/2009   LDLCALC  07/05/2009    51        Total Cholesterol/HDL:CHD Risk Coronary Heart Disease Risk Table                     Men   Women  1/2 Average Risk   3.4   3.3  Average Risk  5.0   4.4  2 X Average Risk   9.6   7.1  3 X  Average Risk  23.4   11.0        Use the calculated Patient Ratio above and the CHD Risk Table to determine the patient's CHD Risk.        ATP III CLASSIFICATION (LDL):  <100     mg/dL   Optimal  100-129  mg/dL   Near or Above                    Optimal  130-159  mg/dL   Borderline  160-189  mg/dL   High  >190     mg/dL   Very High   TRIG 108 07/05/2009   CHOLHDL 2.7 07/05/2009    Significant Diagnostic Results in last 30 days:  DG Elbow 2 Views Left  Result Date: 03/21/2020 CLINICAL DATA:  Postop.  Elbow fracture with external fixator. EXAM: LEFT ELBOW - 2 VIEW COMPARISON:  03/21/2020. FINDINGS: Limited study due to patient positioning. Proximal ulnar fracture not well demonstrated due to positioning. Lateral epicondylar fracture noted distal humerus. Lateral film shows small fragment posterior to the distal humerus without definite donor site. Medial epicondylar injury cannot be excluded on this study. Bones are diffusely demineralized. Fat pads are not well demonstrated but appear elevated. IMPRESSION: Limited study due to bony demineralization and positioning. Displaced fracture of the lateral humeral epicondyle evident with potential medial epicondylar injury is well. Previous exam documented coronoid fracture in these fragments along the medial joint space may be related to that. Lateral film shows bony cortical fragment projecting posterior to the distal humerus without visible donor site. Electronically Signed   By: Misty Stanley M.D.   On: 03/21/2020 14:13   DG Elbow Complete Left  Result Date: 03/21/2020 CLINICAL DATA:  Left elbow reduction. EXAM: LEFT ELBOW - COMPLETE 3+ VIEW; DG C-ARM 1-60 MIN COMPARISON:  02/07/2020. FINDINGS: Fractures of the coronoid process and radial epicondyle of the humerus again noted. Elbow appears located on lateral view. Visualized external hardware intact. IMPRESSION: Postsurgical changes left elbow. Electronically Signed   By: Marcello Moores  Register   On:  03/21/2020 13:16   DG C-Arm 1-60 Min  Result Date: 03/21/2020 CLINICAL DATA:  Left elbow reduction. EXAM: LEFT ELBOW - COMPLETE 3+ VIEW; DG C-ARM 1-60 MIN COMPARISON:  02/07/2020. FINDINGS: Fractures of the coronoid process and radial epicondyle of the humerus again noted. Elbow appears located on lateral view. Visualized external hardware intact. IMPRESSION: Postsurgical changes left elbow. Electronically Signed   By: [place]   On: 03/21/2020 13:16    Assessment/Plan  1. Closed dislocation of left elbow, sequela -  S/P ORIF and external fixation of left elbow on 03/21/2020 -Nonweightbearing on LUE -Continue PRN Norco for pain -Follow-up with Orthopedics, Dr. Katha Hamming, on 04/03/2020  2. Persistent atrial fibrillation (HCC) -Rate controlled, continue Eliquis for anticoagulation and metoprolol titrate for rate control  3. Anemia, unspecified type Lab Results  Component Value Date   HGB 11.7 (L) 03/27/2020   -Continue ferrous sulfate  4. Essential hypertension -BP stable, continue losartan and metoprolol  5. Hypothyroidism, unspecified type Lab Results  Component Value Date   TSH 4.185 12/31/2018   -Continue level thyroxine  6. Hyperlipidemia associated with type 2 diabetes mellitus (HCC) -Continue Crestor  7. Type 2 diabetes mellitus with stage 3b chronic kidney disease, without long-term current use of insulin (HCC) Lab Results  Component Value Date   HGBA1C  6.1 (H) 03/21/2020   -  GFR 30, will decrease glimepiride from 2 mg twice a day to 1 mg daily and continue metformin ER 500 mg daily, CBG BID     Family/ staff Communication: Discussed plan of care with resident and charge nurse.  Labs/tests ordered:  TSH, lipid panel, CBC and BMP  Goals of care:   Short-term care   Durenda Age, DNP, MSN, FNP-BC [facility] [phone] (Monday-Friday 8:00 a.m. - 5:00 p.m.) [phone] (after hours)

## 2020-03-30 ENCOUNTER — Encounter: Payer: Self-pay | Admitting: Internal Medicine

## 2020-03-30 ENCOUNTER — Non-Acute Institutional Stay (SKILLED_NURSING_FACILITY): Payer: Medicare PPO | Admitting: Internal Medicine

## 2020-03-30 DIAGNOSIS — I129 Hypertensive chronic kidney disease with stage 1 through stage 4 chronic kidney disease, or unspecified chronic kidney disease: Secondary | ICD-10-CM

## 2020-03-30 DIAGNOSIS — I10 Essential (primary) hypertension: Secondary | ICD-10-CM

## 2020-03-30 DIAGNOSIS — E1322 Other specified diabetes mellitus with diabetic chronic kidney disease: Secondary | ICD-10-CM | POA: Diagnosis not present

## 2020-03-30 DIAGNOSIS — I4819 Other persistent atrial fibrillation: Secondary | ICD-10-CM

## 2020-03-30 DIAGNOSIS — D62 Acute posthemorrhagic anemia: Secondary | ICD-10-CM | POA: Diagnosis not present

## 2020-03-30 DIAGNOSIS — N289 Disorder of kidney and ureter, unspecified: Secondary | ICD-10-CM

## 2020-03-30 DIAGNOSIS — S53105S Unspecified dislocation of left ulnohumeral joint, sequela: Secondary | ICD-10-CM | POA: Diagnosis not present

## 2020-03-30 DIAGNOSIS — N183 Chronic kidney disease, stage 3 unspecified: Secondary | ICD-10-CM

## 2020-03-30 NOTE — Assessment & Plan Note (Signed)
PT/OT at SNF.  Orthopedic follow-up 1/25 with Dr. Doreatha Martin.

## 2020-03-30 NOTE — Patient Instructions (Signed)
See assessment and plan under each diagnosis in the problem list and acutely for this visit 

## 2020-03-30 NOTE — Assessment & Plan Note (Addendum)
Post elbow surgery 1/12 hemoglobin dropped to 11 and hematocrit 33.7.  She is asymptomatic & is on iron supplement.

## 2020-03-30 NOTE — Progress Notes (Signed)
NURSING HOME LOCATION:  [facility] ROOM NUMBER:  [id number]  CODE STATUS:  FULL CODE  PCP:  Christain Sacramento, MD  [address] [place] [address]  This is a comprehensive admission note to [facility] performed on this date less than 30 days from date of admission. Included are preadmission medical/surgical history; reconciled medication list; family history; social history and comprehensive review of systems.  Corrections and additions to the records were documented. Comprehensive physical exam was also performed. Additionally a clinical summary was entered for each active diagnosis pertinent to this admission in the Problem List to enhance continuity of care.  HPI: Patient was hospitalized 1/12 - 03/28/2020 for scheduled surgery for closed left elbow fracture dislocation.  She underwent open reduction internal fixation of the left elbow fracture by Dr. Doreatha Martin on 1/12.  Due to the unstable nature of her injury once the elbow was reduced, external fixator was applied to stabilize the elbow joint.  Postop the patient was admitted for pain control and PT/OT. CKD stage IIIb was present with creatinine of 1.44 and GFR 36 on 1/13.  Creatinine peaked at 1.77 with GFR of 28.  At discharge creatinine was 1.68 and GFR 30. Hemoglobin A1c was 6.1% on 1/12.  Glucoses were recorded as low as 53. Postop there was mild normochromic, normocytic anemia with hemoglobin 11/hematocrit 33.7. She was discharged to the SNF for ongoing PT/OT.   Orthopedic follow-up was to be 1/25.  Past medical and surgical history: Includes anemia with B12 deficiency, history of A. fib, history of breast cancer, GERD, history of tobacco abuse, history of colon polyps, history of depression, dyslipidemia, essential hypertension, hypothyroidism, OSA on CPAP, osteopenia, diabetes with renal insufficiency, and history of memory loss. Surgeries include back surgery, bowel resection in 1974, cholecystectomy, bilateral  mastectomy in 2001, and total abdominal hysterectomy in 1975.  Social history: She is a nondrinker.  She quit smoking in 2004 and has a 13 year history of smoking.  Family history: There is history of heart attack in both parents; history is noncontributory due to her advanced age.   Review of systems: Validity in question due to neurocognitive deficits. She gave me the date as "February" then "do not know" after a long pause.  Her only complaint is pain @ op site as she points to the left elbow in the immobilizer stating "in my wrist". When I asked if she were anxious or depressed her response was "when there is a situation I do not want to be in". Her long time sitter also states no other complaints voiced to her by the patient.  Constitutional: No fever, significant weight change, fatigue  Eyes: No redness, discharge, pain, vision change ENT/mouth: No nasal congestion, purulent discharge, earache, change in hearing, sore throat  Cardiovascular: No chest pain, palpitations, paroxysmal nocturnal dyspnea, claudication, edema  Respiratory: No cough, sputum production, hemoptysis, DOE, significant snoring, apnea Gastrointestinal: No heartburn, dysphagia, abdominal pain, nausea /vomiting, rectal bleeding, melena, change in bowels Genitourinary: No dysuria, hematuria, pyuria, incontinence, nocturia Dermatologic: No rash, pruritus, change in appearance of skin Neurologic: No dizziness, headache, syncope, seizures, numbness, tingling Psychiatric: No insomnia, anorexia Endocrine: No change in hair/skin/nails, excessive thirst, excessive hunger, excessive urination  Hematologic/lymphatic: No significant bruising, lymphadenopathy, abnormal bleeding Allergy/immunology: No itchy/watery eyes, significant sneezing, urticaria, angioedema  Physical exam:  Pertinent or positive findings: Facies tend to be blank.  Heart sounds are distant and rhythm is irregular.  There is a grade 1/2 systolic murmur at  the  base.  Breath sounds are decreased in the lower lobes.  Central obesity is present.  Pedal pulses are decreased.  She has 1/2+ edema at the sock line.  She has faint scattered hyperpigmented areas over the dorsum of the hands & over the lower extremities distally.  There is edema of the left forearm.  The left arm is in a sling and the embedded device is visible.  General appearance: Adequately nourished; no acute distress, increased work of breathing is present.   Lymphatic: No lymphadenopathy about the head, neck, axilla. Eyes: No conjunctival inflammation or lid edema is present. There is no scleral icterus. Ears:  External ear exam shows no significant lesions or deformities.   Nose:  External nasal examination shows no deformity or inflammation. Nasal mucosa are pink and moist without lesions, exudates Oral exam: Lips and gums are healthy appearing.There is no oropharyngeal erythema or exudate. Neck:  No thyromegaly, masses, tenderness noted.    Heart:  No gallop, murmur, click, rub.  Lungs:  without wheezes, rhonchi, rales, rubs. Abdomen: Bowel sounds are normal.  Abdomen is soft and nontender with no organomegaly, hernias, masses. GU: Deferred  Extremities:  No cyanosis, clubbing Neurologic exam: Balance, Rhomberg, finger to nose testing could not be completed due to clinical state Skin: Warm & dry w/o tenting. No significant rash.  See clinical summary under each active problem in the Problem List with associated updated therapeutic plan

## 2020-03-30 NOTE — Assessment & Plan Note (Addendum)
Avoid nephrotoxic drugs. Metformin is low dose & will not D/Ced unless CKD worsens.

## 2020-03-30 NOTE — Assessment & Plan Note (Addendum)
Rate controlled. Maintenance Eliquis prophylaxis.

## 2020-03-30 NOTE — Assessment & Plan Note (Addendum)
A1c was 6.1%.  She is on an oral sulfonylurea.  In the hospital glucoses were as low as 53.  Here at the facility the only fasting glucose recorded is 152.  Evening glucoses have ranged from 132 up to 295.  The latter is an outlier. As glimepiride is on the [facility] list, consideration will be given to basal insulin while at the SNF.

## 2020-03-30 NOTE — Assessment & Plan Note (Signed)
BP controlled; no change in antihypertensive medications  

## 2020-03-31 LAB — LIPID PANEL
Cholesterol: 108 (ref 0–200)
HDL: 43 (ref 35–70)
LDL Cholesterol: 53
LDl/HDL Ratio: 2.5
Triglycerides: 62 (ref 40–160)

## 2020-03-31 LAB — CBC AND DIFFERENTIAL
HCT: 33 — AB (ref 36–46)
Hemoglobin: 11.1 — AB (ref 12.0–16.0)
Neutrophils Absolute: 4.5
Platelets: 240 (ref 150–399)
WBC: 6.3

## 2020-03-31 LAB — COMPREHENSIVE METABOLIC PANEL
Calcium: 8.1 — AB (ref 8.7–10.7)
GFR calc Af Amer: 47.5
GFR calc non Af Amer: 40.98

## 2020-03-31 LAB — CBC: RBC: 3.58 — AB (ref 3.87–5.11)

## 2020-03-31 LAB — BASIC METABOLIC PANEL
BUN: 29 — AB (ref 4–21)
CO2: 25 — AB (ref 13–22)
Chloride: 103 (ref 99–108)
Creatinine: 1.2 — AB (ref 0.5–1.1)
Glucose: 124
Potassium: 4.5 (ref 3.4–5.3)
Sodium: 142 (ref 137–147)

## 2020-03-31 LAB — TSH: TSH: 2.19 (ref 0.41–5.90)

## 2020-04-02 LAB — BASIC METABOLIC PANEL
BUN: 23 — AB (ref 4–21)
CO2: 24 — AB (ref 13–22)
Chloride: 103 (ref 99–108)
Creatinine: 1.1 (ref 0.5–1.1)
Glucose: 115
Potassium: 4.1 (ref 3.4–5.3)
Sodium: 136 — AB (ref 137–147)

## 2020-04-02 LAB — COMPREHENSIVE METABOLIC PANEL
Calcium: 7.7 — AB (ref 8.7–10.7)
GFR calc Af Amer: 55.11
GFR calc non Af Amer: 47.55

## 2020-04-02 LAB — TSH: TSH: 1.8 (ref 0.41–5.90)

## 2020-04-02 LAB — CBC: RBC: 3.37 — AB (ref 3.87–5.11)

## 2020-04-02 LAB — LIPID PANEL
Cholesterol: 85 (ref 0–200)
HDL: 35 (ref 35–70)
LDL Cholesterol: 38
LDl/HDL Ratio: 2.4
Triglycerides: 58 (ref 40–160)

## 2020-04-02 LAB — CBC AND DIFFERENTIAL
HCT: 31 — AB (ref 36–46)
Hemoglobin: 10.4 — AB (ref 12.0–16.0)
Neutrophils Absolute: 3.7
Platelets: 217 (ref 150–399)
WBC: 5.3

## 2020-04-03 ENCOUNTER — Other Ambulatory Visit: Payer: Self-pay | Admitting: Otolaryngology

## 2020-04-03 ENCOUNTER — Ambulatory Visit
Admission: RE | Admit: 2020-04-03 | Discharge: 2020-04-03 | Disposition: A | Discharge status: Home / Self Care | Payer: Self-pay | Source: Ambulatory Visit | Attending: Otolaryngology | Admitting: Otolaryngology

## 2020-04-03 DIAGNOSIS — E041 Nontoxic single thyroid nodule: Secondary | ICD-10-CM

## 2020-04-09 ENCOUNTER — Non-Acute Institutional Stay (SKILLED_NURSING_FACILITY): Payer: Medicare PPO | Admitting: Adult Health

## 2020-04-09 ENCOUNTER — Encounter: Payer: Self-pay | Admitting: Adult Health

## 2020-04-09 DIAGNOSIS — D62 Acute posthemorrhagic anemia: Secondary | ICD-10-CM

## 2020-04-09 DIAGNOSIS — Z794 Long term (current) use of insulin: Secondary | ICD-10-CM

## 2020-04-09 DIAGNOSIS — S53105S Unspecified dislocation of left ulnohumeral joint, sequela: Secondary | ICD-10-CM | POA: Diagnosis not present

## 2020-04-09 DIAGNOSIS — I4819 Other persistent atrial fibrillation: Secondary | ICD-10-CM | POA: Diagnosis not present

## 2020-04-09 DIAGNOSIS — N1831 Chronic kidney disease, stage 3a: Secondary | ICD-10-CM

## 2020-04-09 DIAGNOSIS — E1122 Type 2 diabetes mellitus with diabetic chronic kidney disease: Secondary | ICD-10-CM

## 2020-04-09 NOTE — Progress Notes (Signed)
Location:  [facility] Room Number: 211-A Place of Service:  SNF (31) Provider:  Durenda Age, DNP, FNP-BC  Patient Care Team: Christain Sacramento, MD as PCP - General (Family Medicine) Buford Dresser, MD as PCP - Cardiology (Cardiology)  Extended Emergency Contact Information Primary Emergency Contact: Callicott,Ann Address: [address], [place] Phone: [phone] Mobile Phone: [phone] Relation: Other Secondary Emergency Contact: [place] Mobile Phone: [phone] Relation: Other  Code Status:  FULL CODE  Goals of care: Advanced Directive information Advanced Directives 03/21/2020  Does Patient Have a Medical Advance Directive? Yes  Type of Paramedic of [place];Living will  Does patient want to make changes to medical advance directive? No - Patient declined  Copy of [place] in Chart? No - copy requested  Would patient like information on creating a medical advance directive? -  Pre-existing out of facility DNR order (yellow form or pink MOST form) -     Chief Complaint  Patient presents with  . Medical Management of Chronic Issues    Short-term rehabilitation visit    HPI:  Pt is an 87 y.o. female seen today for short-term rehabilitation visit.  She has a PMH of B12 deficiency, atrial fibrillation, breast cancer, GERD, tobacco abuse, colon polyps, depression, dyslipidemia, essential hypertension, hypothyroidism, OSA on CPAP, osteopenia, diabetes with renal insufficiency and memory loss. She remains to have external fixator on her left arm. She denies having pain. CBGs ranging from 101 to 213. She takes Metformin 500 mg daily and Lantus dose 10 units at bedtime for diabetes mellitus. Latest hgb 10.4, 04/02/20. She takes FeSO4 325 mg BID for anemia.    Past Medical History:  Diagnosis Date  . Anemia   . Anemia of decreased vitamin B12  absorption 05/26/2005  . Arthritis   . Atrial fibrillation ([place])   . Breast cancer ([place]) 2001  . Cataract   . GERD (gastroesophageal reflux disease)   . History of tobacco abuse   . Hx of colonic polyps   . Hyperlipidemia   . Hypertension   . Hypothyroidism   . Memory loss   . Obesity   . OSA on CPAP 09/2007   AHI 10.6/hr overall, 43.64/hr during REM, lost weight, does not use cpap  . Osteopenia   . Renal insufficiency   . Type 2 diabetes mellitus ([place])    Past Surgical History:  Procedure Laterality Date  . BACK SURGERY  05/30/2009  . BIOPSY  01/01/2019   Procedure: BIOPSY;  Surgeon: Milus Banister, MD;  Location: [facility] ENDOSCOPY;  Service: Endoscopy;;  . BOWEL RESECTION  1974  . CATARACT EXTRACTION Bilateral    bilateral  . CHOLECYSTECTOMY  1974  . COLONOSCOPY    . ESOPHAGOGASTRODUODENOSCOPY N/A 01/01/2019   Procedure: ESOPHAGOGASTRODUODENOSCOPY (EGD);  Surgeon: Milus Banister, MD;  Location: [facility] ENDOSCOPY;  Service: Endoscopy;  Laterality: N/A;  . MASTECTOMY Bilateral 2001   bilateral sentinel lymph nodes bio  . NM MYOCAR PERF WALL MOTION  06/2007   dipyridamole; perfusion defect in inferior myocardium consistent with diaphragmatic attenuation, remaining myocardium with no ischemia/infarct, EF 73%; normal, low risk scan   . ORIF ELBOW FRACTURE Left 03/21/2020   Procedure: OPEN REDUCTION INTERNAL FIXATION (ORIF) ELBOW/OLECRANON FRACTURE;  Surgeon: Shona Needles, MD;  Location: [facility];  Service: Orthopedics;  Laterality: Left;  . TOTAL ABDOMINAL HYSTERECTOMY  1975  . TRANSTHORACIC ECHOCARDIOGRAM  02/2010  123456, stage 1 diastolic dysfunction; borderline RV enlargement; LA mild-mod dilated; mild mitral annular calcif & mild MR; mild TR with normal RSVP, AV moderately sclerotics    Allergies  Allergen Reactions  . Codeine Sulfate Nausea Only    Outpatient Encounter Medications as of 04/09/2020  Medication Sig  . acetaminophen (TYLENOL) 325 MG tablet Take 650 mg by mouth  every 6 (six) hours as needed.  Marland Kitchen apixaban (ELIQUIS) 2.5 MG TABS tablet Take 1 tablet (2.5 mg total) by mouth 2 (two) times daily.  . brimonidine-timolol (COMBIGAN) 0.2-0.5 % ophthalmic solution Place 1 drop into both eyes every 12 (twelve) hours.  . ferrous sulfate 325 (65 FE) MG tablet Take 1 tablet (325 mg total) by mouth 2 (two) times daily with a meal.  . FLUoxetine (PROZAC) 20 MG capsule Take 1 capsule (20 mg total) by mouth daily.  Marland Kitchen HYDROcodone-acetaminophen (NORCO/VICODIN) 5-325 MG tablet Take 1 tablet by mouth every 6 (six) hours as needed for severe pain.  . Insulin Glargine (LANTUS SOLOSTAR [employer]) Inject 10 Units into the skin at bedtime.  Marland Kitchen levothyroxine (SYNTHROID) 50 MCG tablet Take 1 tablet (50 mcg total) by mouth daily.  Marland Kitchen losartan (COZAAR) 50 MG tablet Take 1 tablet (50 mg total) by mouth 2 (two) times daily.  . metFORMIN (GLUCOPHAGE-XR) 500 MG 24 hr tablet Take 500 mg by mouth daily.  . metoprolol tartrate (LOPRESSOR) 25 MG tablet Take 0.5 tablets (12.5 mg total) by mouth 2 (two) times daily.  . Multiple Vitamins-Minerals (PRESERVISION AREDS 2) CAPS Take 2 capsules by mouth daily.  . ondansetron (ZOFRAN) 4 MG tablet Take 1 tablet (4 mg total) by mouth every 6 (six) hours as needed for nausea or vomiting.  . pantoprazole (PROTONIX) 40 MG tablet Take 1 tablet (40 mg total) by mouth 2 (two) times daily before a meal.  . polyethylene glycol (MIRALAX / GLYCOLAX) 17 g packet Take 17 g by mouth daily as needed for mild constipation.  . rosuvastatin (CRESTOR) 5 MG tablet Take 5 mg by mouth daily.  . [DISCONTINUED] glimepiride (AMARYL) 2 MG tablet Take 2 mg by mouth in the morning and at bedtime.   No facility-administered encounter medications on file as of 04/09/2020.    Review of Systems  GENERAL: No change in appetite, no fatigue, no weight changes, no fever, chills or weakness MOUTH and THROAT: Denies oral discomfort, gingival pain or bleeding RESPIRATORY: no cough, SOB, DOE,  wheezing, hemoptysis CARDIAC: No chest pain, edema or palpitations GI: No abdominal pain, diarrhea, constipation, heart burn, nausea or vomiting NEUROLOGICAL: Denies dizziness, syncope, numbness, or headache PSYCHIATRIC: Denies feelings of depression or anxiety. No report of hallucinations, insomnia, paranoia, or agitation    Immunization History  Administered Date(s) Administered  . Influenza, High Dose Seasonal PF 01/30/2016, 12/27/2019  . Influenza-Unspecified 01/11/2019  . PFIZER(Purple Top)SARS-COV-2 Vaccination 07/07/2019, 07/28/2019  . Pneumococcal Conjugate-13 07/25/2013  . Pneumococcal Polysaccharide-23 08/06/2017  . Tdap 10/07/2017  . Zoster 08/08/2013  . Zoster Recombinat (Shingrix) 10/07/2017   Pertinent  Health Maintenance Due  Topic Date Due  . FOOT EXAM  Never done  . OPHTHALMOLOGY EXAM  Never done  . COLONOSCOPY (Pts 45-81yrs Insurance coverage will need to be confirmed)  07/30/2014  . HEMOGLOBIN A1C  09/18/2020  . INFLUENZA VACCINE  Completed  . DEXA SCAN  Completed  . PNA vac Low Risk Adult  Completed    Vitals:   04/09/20 1045  BP: 132/78  Pulse: 75  Resp: 18  Temp: (!) 97.3 F (  36.3 C)  TempSrc: Oral  Weight: 198 lb 6.4 oz (90 kg)  Height: 5\' 6"  (1.676 m)   Body mass index is 32.02 kg/m.  Physical Exam  GENERAL APPEARANCE: Well nourished. In no acute distress. Obese SKIN:  Left arm pin sites dry, no erythema MOUTH and THROAT: Lips are without lesions. Oral mucosa is moist and without lesions. Tongue is normal in shape, size, and color and without lesions RESPIRATORY: Breathing is even & unlabored, BS CTAB CARDIAC: RRR, no murmur,no extra heart sounds, no edema GI: Abdomen soft, normal BS, no masses, no tenderness NEUROLOGICAL: There is no tremor. Speech is clear.  PSYCHIATRIC: Affect and behavior are appropriate  Labs reviewed: Recent Labs    03/26/20 0348 03/27/20 0528 03/28/20 0743 03/31/20 0000  NA 137 134* 133* 142  K 5.0 4.7 4.8  4.5  CL 103 99 101 103  CO2 26 25 23  25*  GLUCOSE 133* 166* 166*  --   BUN 38* 36* 33* 29*  CREATININE 1.77* 1.63* 1.68* 1.2*  CALCIUM 8.6* 8.7* 8.7* 8.1*   Recent Labs    02/07/20 1434  AST 21  ALT 13  ALKPHOS 69  BILITOT 1.2  PROT 6.4*  ALBUMIN 3.3*   Recent Labs    02/07/20 1434 03/21/20 0739 03/22/20 0314 03/23/20 0429 03/27/20 0856 03/31/20 0000  WBC 10.3   < > 7.1 6.9 5.2 6.3  NEUTROABS 8.5*  --   --   --   --  4.50  HGB 14.2   < > 12.1 11.0* 11.7* 11.1*  HCT 44.8   < > 39.4 33.7* 37.2 33*  MCV 93.5   < > 97.8 94.9 95.1  --   PLT 223   < > 206 195 253 240   < > = values in this interval not displayed.   Lab Results  Component Value Date   TSH 2.19 03/31/2020   Lab Results  Component Value Date   HGBA1C 6.1 (H) 03/21/2020   Lab Results  Component Value Date   CHOL 108 03/31/2020   HDL 43 03/31/2020   LDLCALC 53 03/31/2020   TRIG 62 03/31/2020   CHOLHDL 2.7 07/05/2009    Significant Diagnostic Results in last 30 days:  DG Elbow 2 Views Left  Result Date: 03/21/2020 CLINICAL DATA:  Postop.  Elbow fracture with external fixator. EXAM: LEFT ELBOW - 2 VIEW COMPARISON:  03/21/2020. FINDINGS: Limited study due to patient positioning. Proximal ulnar fracture not well demonstrated due to positioning. Lateral epicondylar fracture noted distal humerus. Lateral film shows small fragment posterior to the distal humerus without definite donor site. Medial epicondylar injury cannot be excluded on this study. Bones are diffusely demineralized. Fat pads are not well demonstrated but appear elevated. IMPRESSION: Limited study due to bony demineralization and positioning. Displaced fracture of the lateral humeral epicondyle evident with potential medial epicondylar injury is well. Previous exam documented coronoid fracture in these fragments along the medial joint space may be related to that. Lateral film shows bony cortical fragment projecting posterior to the distal humerus  without visible donor site. Electronically Signed   By: Misty Stanley M.D.   On: 03/21/2020 14:13   DG Elbow Complete Left  Result Date: 03/21/2020 CLINICAL DATA:  Left elbow reduction. EXAM: LEFT ELBOW - COMPLETE 3+ VIEW; DG C-ARM 1-60 MIN COMPARISON:  02/07/2020. FINDINGS: Fractures of the coronoid process and radial epicondyle of the humerus again noted. Elbow appears located on lateral view. Visualized external hardware intact. IMPRESSION: Postsurgical changes left  elbow. Electronically Signed   By: Marcello Moores  Register   On: 03/21/2020 13:16   DG C-Arm 1-60 Min  Result Date: 03/21/2020 CLINICAL DATA:  Left elbow reduction. EXAM: LEFT ELBOW - COMPLETE 3+ VIEW; DG C-ARM 1-60 MIN COMPARISON:  02/07/2020. FINDINGS: Fractures of the coronoid process and radial epicondyle of the humerus again noted. Elbow appears located on lateral view. Visualized external hardware intact. IMPRESSION: Postsurgical changes left elbow. Electronically Signed   By: [place]   On: 03/21/2020 13:16    Assessment/Plan  1. Postoperative anemia due to acute blood loss Lab Results  Component Value Date   HGB 10.4 (A) 04/02/2020   -Stable, continue ferrous sulfate  2. Persistent atrial fibrillation (HCC) -   Rate controlled, continue metoprolol titrate for rate control and Eliquis for anticoagulation  3. Closed dislocation of left elbow, sequela -  S/P ORIF and external fixation of left elbow on 03/21/2020 -   Nonweightbearing on LUE -   Continue PRN Norco for pain -   Follow-up with orthopedics  4. Type 2 diabetes mellitus with stage 3a chronic kidney disease, with long-term current use of insulin ([place]) Lab Results  Component Value Date   HGBA1C 6.1 (H) 03/21/2020   -   CBGs is stable, continue Metformin and Lantus -    Monitor CBGs     Family/ staff Communication: Discussed plan of care with resident and charge nurse.   Labs/tests ordered: None   Goals of care:   Short-term care   Durenda Age, DNP, MSN, FNP-BC [facility] [phone] (Monday-Friday 8:00 a.m. - 5:00 p.m.) [phone] (after hours)

## 2020-04-13 ENCOUNTER — Non-Acute Institutional Stay (SKILLED_NURSING_FACILITY): Payer: Medicare PPO | Admitting: Adult Health

## 2020-04-13 ENCOUNTER — Encounter: Payer: Self-pay | Admitting: Adult Health

## 2020-04-13 DIAGNOSIS — S53105S Unspecified dislocation of left ulnohumeral joint, sequela: Secondary | ICD-10-CM | POA: Diagnosis not present

## 2020-04-13 DIAGNOSIS — E1122 Type 2 diabetes mellitus with diabetic chronic kidney disease: Secondary | ICD-10-CM | POA: Diagnosis not present

## 2020-04-13 DIAGNOSIS — N1831 Chronic kidney disease, stage 3a: Secondary | ICD-10-CM

## 2020-04-13 DIAGNOSIS — D649 Anemia, unspecified: Secondary | ICD-10-CM

## 2020-04-13 DIAGNOSIS — E785 Hyperlipidemia, unspecified: Secondary | ICD-10-CM

## 2020-04-13 DIAGNOSIS — K2210 Ulcer of esophagus without bleeding: Secondary | ICD-10-CM

## 2020-04-13 DIAGNOSIS — Z794 Long term (current) use of insulin: Secondary | ICD-10-CM

## 2020-04-13 DIAGNOSIS — S53105D Unspecified dislocation of left ulnohumeral joint, subsequent encounter: Secondary | ICD-10-CM

## 2020-04-13 DIAGNOSIS — I10 Essential (primary) hypertension: Secondary | ICD-10-CM

## 2020-04-13 DIAGNOSIS — D62 Acute posthemorrhagic anemia: Secondary | ICD-10-CM | POA: Diagnosis not present

## 2020-04-13 DIAGNOSIS — F339 Major depressive disorder, recurrent, unspecified: Secondary | ICD-10-CM

## 2020-04-13 DIAGNOSIS — K219 Gastro-esophageal reflux disease without esophagitis: Secondary | ICD-10-CM

## 2020-04-13 DIAGNOSIS — E039 Hypothyroidism, unspecified: Secondary | ICD-10-CM

## 2020-04-13 DIAGNOSIS — I4819 Other persistent atrial fibrillation: Secondary | ICD-10-CM

## 2020-04-13 DIAGNOSIS — E1169 Type 2 diabetes mellitus with other specified complication: Secondary | ICD-10-CM

## 2020-04-13 NOTE — Progress Notes (Addendum)
Location:  [place] Room Number: 211-A Place of Service:  SNF (31) Provider:  Durenda Age, DNP, FNP-BC  Patient Care Team: Christain Sacramento, MD as PCP - General (Family Medicine) Buford Dresser, MD as PCP - Cardiology (Cardiology)  Extended Emergency Contact Information Primary Emergency Contact: Callicott,Ann Address: [address], [place] Phone: [phone] Mobile Phone: [phone] Relation: Other Secondary Emergency Contact: [place] Mobile Phone: [phone] Relation: Other  Code Status:  FULL CODE  Goals of care: Advanced Directive information Advanced Directives 03/21/2020  Does Patient Have a Medical Advance Directive? Yes  Type of Paramedic of [place];Living will  Does patient want to make changes to medical advance directive? No - Patient declined  Copy of [place] in Chart? No - copy requested  Would patient like information on creating a medical advance directive? -  Pre-existing out of facility DNR order (yellow form or pink MOST form) -   Addendum 04/17/20:   Patient was approved by insurance to extend her stay at [facility].  Chief Complaint  Patient presents with  . Acute Visit    For possible discharge     HPI:  Pt is an 87 y.o. [name] who is for discharge on 04/15/20 with Home health PT and OT.  She was admitted to [facility] on 03/28/2020 post [facility] hospitalization 03/21/2020 to 03/28/2020 for closed dislocation of left elbow for which she had scheduled open reduction and internal fixation on 03/21/2020.  Review of records showed that she had injured her elbow in early December 2021.  She had a lateral epicondyle fracture.  She was placed in a long-arm cast.  She subsequently presented to [facility] and x-ray in the cast showed that she had a posterior dislocation.  The cast was cut down and she  was placed in a sling.  Due to the unstable nature of her injury, once stable was reduced, patient was placed in an external fixator to stabilize the elbow joint.  She is nonweightbearing on left upper extremity and follows up with orthopedics, Dr. Lennette Bihari Haddix.  She has a PMH of B12 deficiency, atrial fibrillation, breast cancer, GERD, tobacco abuse, colon polyps, depression, dyslipidemia, essential hypertension, hypothyroidism, OSA on CPAP, osteopenia, diabetes with renal insufficiency and history of memory loss.  Patient was admitted to this facility for short-term rehabilitation after the patient's recent hospitalization.  Patient has completed SNF rehabilitation and therapy has cleared the patient for discharge.   Past Medical History:  Diagnosis Date  . Anemia   . Anemia of decreased vitamin B12 absorption 05/26/2005  . Arthritis   . Atrial fibrillation ([place])   . Breast cancer ([place]) 2001  . Cataract   . GERD (gastroesophageal reflux disease)   . History of tobacco abuse   . Hx of colonic polyps   . Hyperlipidemia   . Hypertension   . Hypothyroidism   . Memory loss   . Obesity   . OSA on CPAP 09/2007   AHI 10.6/hr overall, 43.Diana/hr during REM, lost weight, does not use cpap  . Osteopenia   . Renal insufficiency   . Type 2 diabetes mellitus ([place])    Past Surgical History:  Procedure Laterality Date  . BACK SURGERY  05/30/2009  . BIOPSY  01/01/2019   Procedure: BIOPSY;  Surgeon: Milus Banister, MD;  Location: [facility] ENDOSCOPY;  Service: Endoscopy;;  . BOWEL RESECTION  1974  .  CATARACT EXTRACTION Bilateral    bilateral  . CHOLECYSTECTOMY  1974  . COLONOSCOPY    . ESOPHAGOGASTRODUODENOSCOPY N/A 01/01/2019   Procedure: ESOPHAGOGASTRODUODENOSCOPY (EGD);  Surgeon: Milus Banister, MD;  Location: [facility] ENDOSCOPY;  Service: Endoscopy;  Laterality: N/A;  . MASTECTOMY Bilateral 2001   bilateral sentinel lymph nodes bio  . NM MYOCAR PERF WALL MOTION  06/2007   dipyridamole; perfusion  defect in inferior myocardium consistent with diaphragmatic attenuation, remaining myocardium with no ischemia/infarct, EF 73%; normal, low risk scan   . ORIF ELBOW FRACTURE Left 03/21/2020   Procedure: OPEN REDUCTION INTERNAL FIXATION (ORIF) ELBOW/OLECRANON FRACTURE;  Surgeon: Shona Needles, MD;  Location: [facility];  Service: Orthopedics;  Laterality: Left;  . TOTAL ABDOMINAL HYSTERECTOMY  1975  . TRANSTHORACIC ECHOCARDIOGRAM  02/2010   AS=>34%, stage 1 diastolic dysfunction; borderline RV enlargement; LA mild-mod dilated; mild mitral annular calcif & mild MR; mild TR with normal RSVP, AV moderately sclerotics    Allergies  Allergen Reactions  . Codeine Sulfate Nausea Only    Outpatient Encounter Medications as of 04/13/2020  Medication Sig  . acetaminophen (TYLENOL) 325 MG tablet Take 650 mg by mouth every 6 (six) hours as needed.  . insulin glargine (LANTUS SOLOSTAR) 100 UNIT/ML Solostar Pen Inject 10 Units into the skin at bedtime.  . Insulin Pen Needle 32G X 4 MM MISC 1 each by Does not apply route as needed.  . Multiple Vitamins-Minerals (PRESERVISION AREDS 2) CAPS Take 2 capsules by mouth daily.  . polyethylene glycol (MIRALAX / GLYCOLAX) 17 g packet Take 17 g by mouth daily as needed for mild constipation.  Marland Kitchen apixaban (ELIQUIS) 2.5 MG TABS tablet Take 1 tablet (2.5 mg total) by mouth 2 (two) times daily.  . brimonidine-timolol (COMBIGAN) 0.2-0.5 % ophthalmic solution Place 1 drop into both eyes every 12 (twelve) hours.  . ferrous sulfate 325 (65 FE) MG tablet Take 1 tablet (325 mg total) by mouth 2 (two) times daily with a meal.  . FLUoxetine (PROZAC) 20 MG capsule Take 1 capsule (20 mg total) by mouth daily.  Marland Kitchen HYDROcodone-acetaminophen (NORCO/VICODIN) 5-325 MG tablet Take 1 tablet by mouth every 6 (six) hours as needed for severe pain.  Marland Kitchen levothyroxine (SYNTHROID) 50 MCG tablet Take 1 tablet (50 mcg total) by mouth daily.  Marland Kitchen losartan (COZAAR) 50 MG tablet Take 1 tablet (50 mg total)  by mouth 2 (two) times daily.  . metFORMIN (GLUCOPHAGE-XR) 500 MG 24 hr tablet Take 1 tablet (500 mg total) by mouth daily.  . metoprolol tartrate (LOPRESSOR) 25 MG tablet Take 0.5 tablets (12.5 mg total) by mouth 2 (two) times daily.  . ondansetron (ZOFRAN) 4 MG tablet Take 1 tablet (4 mg total) by mouth every 6 (six) hours as needed for nausea or vomiting.  . pantoprazole (PROTONIX) 40 MG tablet Take 1 tablet (40 mg total) by mouth 2 (two) times daily before a meal.  . rosuvastatin (CRESTOR) 5 MG tablet Take 1 tablet (5 mg total) by mouth daily.  . [DISCONTINUED] apixaban (ELIQUIS) 2.5 MG TABS tablet Take 1 tablet (2.5 mg total) by mouth 2 (two) times daily.  . [DISCONTINUED] brimonidine-timolol (COMBIGAN) 0.2-0.5 % ophthalmic solution Place 1 drop into both eyes every 12 (twelve) hours.  . [DISCONTINUED] ferrous sulfate 325 (65 FE) MG tablet Take 1 tablet (325 mg total) by mouth 2 (two) times daily with a meal.  . [DISCONTINUED] FLUoxetine (PROZAC) 20 MG capsule Take 1 capsule (20 mg total) by mouth daily.  . [DISCONTINUED] HYDROcodone-acetaminophen (  NORCO/VICODIN) 5-325 MG tablet Take 1 tablet by mouth every 6 (six) hours as needed for severe pain.  . [DISCONTINUED] Insulin Glargine (LANTUS SOLOSTAR [employer] [employer]) Inject 10 Units into the skin at bedtime.  . [DISCONTINUED] levothyroxine (SYNTHROID) 50 MCG tablet Take 1 tablet (50 mcg total) by mouth daily.  . [DISCONTINUED] losartan (COZAAR) 50 MG tablet Take 1 tablet (50 mg total) by mouth 2 (two) times daily.  . [DISCONTINUED] metFORMIN (GLUCOPHAGE-XR) 500 MG 24 hr tablet Take 500 mg by mouth daily.  . [DISCONTINUED] metoprolol tartrate (LOPRESSOR) 25 MG tablet Take 0.5 tablets (12.5 mg total) by mouth 2 (two) times daily.  . [DISCONTINUED] ondansetron (ZOFRAN) 4 MG tablet Take 1 tablet (4 mg total) by mouth every 6 (six) hours as needed for nausea or vomiting.  . [DISCONTINUED] pantoprazole (PROTONIX) 40 MG tablet Take 1 tablet (40 mg total) by mouth 2  (two) times daily before a meal.  . [DISCONTINUED] rosuvastatin (CRESTOR) 5 MG tablet Take 5 mg by mouth daily.   No facility-administered encounter medications on file as of 04/13/2020.    Review of Systems  GENERAL: No change in appetite, no fatigue, no weight changes, no fever, chills or weakness MOUTH and THROAT: Denies oral discomfort, gingival pain or bleeding RESPIRATORY: no cough, SOB, DOE, wheezing, hemoptysis CARDIAC: No chest pain, edema or palpitations GI: No abdominal pain, diarrhea, constipation, heart burn, nausea or vomiting GU: Denies dysuria, frequency, hematuria or discharge NEUROLOGICAL: Denies dizziness, syncope, numbness, or headache PSYCHIATRIC: Denies feelings of depression or anxiety. No report of hallucinations, insomnia, paranoia, or agitation   Immunization History  Administered Date(s) Administered  . Influenza, High Dose Seasonal PF 01/30/2016, 12/27/2019  . Influenza-Unspecified 01/11/2019  . PFIZER(Purple Top)SARS-COV-2 Vaccination 07/07/2019, 07/28/2019  . Pneumococcal Conjugate-13 07/25/2013  . Pneumococcal Polysaccharide-23 08/06/2017  . Tdap 10/07/2017  . Zoster 08/08/2013  . Zoster Recombinat (Shingrix) 10/07/2017   Pertinent  Health Maintenance Due  Topic Date Due  . COLONOSCOPY (Pts 45-65yrs Insurance coverage will need to be confirmed)  07/30/2014  . OPHTHALMOLOGY EXAM  05/09/2018  . HEMOGLOBIN A1C  09/18/2020  . FOOT EXAM  12/26/2020  . INFLUENZA VACCINE  Completed  . DEXA SCAN  Completed  . PNA vac Low Risk Adult  Completed    Vitals:   04/13/20 1431  BP: (!) 146/77  Pulse: 77  Resp: 17  Temp: (!) 97.4 F (36.3 C)  TempSrc: Oral  Weight: 192 lb (87.1 kg)  Height: 5\' 6"  (1.676 m)   Body mass index is 30.99 kg/m.  Physical Exam  GENERAL APPEARANCE: Well nourished. In no acute distress.  SKIN:  Skin is warm and dry.  No redness on LUE external fixator pin sites MOUTH and THROAT: Lips are without lesions. Oral mucosa is  moist and without lesions.  RESPIRATORY: Breathing is even & unlabored, BS CTAB CARDIAC: RRR, no murmur,no extra heart sounds, no edema GI: Abdomen soft, normal BS, no masses, no tenderness EXTREMITIES:  LUE with external fixator NEUROLOGICAL: There is no tremor. Speech is clear. Alert to self and time, disoriented to place. PSYCHIATRIC:  Affect and behavior are appropriate  Labs reviewed: Recent Labs    03/26/20 0348 03/27/20 0528 03/28/20 0743 03/31/20 0000 04/02/20 0000  NA 137 134* 133* 142 136*  K 5.0 4.7 4.8 4.5 4.1  CL 103 99 101 103 103  CO2 26 25 23  25* 24*  GLUCOSE 133* 166* 166*  --   --   BUN 38* 36* 33* 29* 23*  CREATININE 1.77*  1.63* 1.68* 1.2* 1.1  CALCIUM 8.6* 8.7* 8.7* 8.1* 7.7*   Recent Labs    02/07/20 1434  AST 21  ALT 13  ALKPHOS 69  BILITOT 1.2  PROT 6.4*  ALBUMIN 3.3*   Recent Labs    02/07/20 1434 03/21/20 0739 03/22/20 0314 03/23/20 0429 03/27/20 0856 03/31/20 0000 04/02/20 0000  WBC 10.3   < > 7.1 6.9 5.2 6.3 5.3  NEUTROABS 8.5*  --   --   --   --  4.50 3.70  HGB 14.2   < > 12.1 11.0* 11.7* 11.1* 10.4*  HCT 44.8   < > 39.4 33.7* 37.2 33* 31*  MCV 93.5   < > 97.8 94.9 95.1  --   --   PLT 223   < > 206 195 253 240 217   < > = values in this interval not displayed.   Lab Results  Component Value Date   TSH 1.80 04/02/2020   Lab Results  Component Value Date   HGBA1C 6.1 (H) 03/21/2020   Lab Results  Component Value Date   CHOL 85 04/02/2020   HDL 35 04/02/2020   LDLCALC 38 04/02/2020   TRIG 58 04/02/2020   CHOLHDL 2.7 07/05/2009    Significant Diagnostic Results in last 30 days:  DG Elbow 2 Views Left  Result Date: 03/21/2020 CLINICAL DATA:  Postop.  Elbow fracture with external fixator. EXAM: LEFT ELBOW - 2 VIEW COMPARISON:  03/21/2020. FINDINGS: Limited study due to patient positioning. Proximal ulnar fracture not well demonstrated due to positioning. Lateral epicondylar fracture noted distal humerus. Lateral film shows  small fragment posterior to the distal humerus without definite donor site. Medial epicondylar injury cannot be excluded on this study. Bones are diffusely demineralized. Fat pads are not well demonstrated but appear elevated. IMPRESSION: Limited study due to bony demineralization and positioning. Displaced fracture of the lateral humeral epicondyle evident with potential medial epicondylar injury is well. Previous exam documented coronoid fracture in these fragments along the medial joint space may be related to that. Lateral film shows bony cortical fragment projecting posterior to the distal humerus without visible donor site. Electronically Signed   By: Misty Stanley M.D.   On: 03/21/2020 14:13   DG Elbow Complete Left  Result Date: 03/21/2020 CLINICAL DATA:  Left elbow reduction. EXAM: LEFT ELBOW - COMPLETE 3+ VIEW; DG C-ARM 1-60 MIN COMPARISON:  02/07/2020. FINDINGS: Fractures of the coronoid process and radial epicondyle of the humerus again noted. Elbow appears located on lateral view. Visualized external hardware intact. IMPRESSION: Postsurgical changes left elbow. Electronically Signed   By: Marcello Moores  Register   On: 03/21/2020 13:16   DG C-Arm 1-60 Min  Result Date: 03/21/2020 CLINICAL DATA:  Left elbow reduction. EXAM: LEFT ELBOW - COMPLETE 3+ VIEW; DG C-ARM 1-60 MIN COMPARISON:  02/07/2020. FINDINGS: Fractures of the coronoid process and radial epicondyle of the humerus again noted. Elbow appears located on lateral view. Visualized external hardware intact. IMPRESSION: Postsurgical changes left elbow. Electronically Signed   By: [place]   On: 03/21/2020 13:16    Assessment/Plan  1. Closed dislocation of left elbow, sequela -  S/P ORIF and external fixation of left elbow on 03/21/2020 -   Nonweightbearing on LUE -   Follow-up with orthopedics, Dr. Lennette Bihari Haddix - HYDROcodone-acetaminophen (NORCO/VICODIN) 5-325 MG tablet; Take 1 tablet by mouth every 6 (six) hours as needed for severe  pain.  Dispense: 20 tablet; Refill: 0  2. Primary hypertension - losartan (COZAAR) 50 MG  tablet; Take 1 tablet (50 mg total) by mouth 2 (two) times daily.  Dispense: 60 tablet; Refill: 0  3. Postoperative anemia due to acute blood loss Lab Results  Component Value Date   HGB 10.4 (A) 04/02/2020   - ferrous sulfate 325 (65 FE) MG tablet; Take 1 tablet (325 mg total) by mouth 2 (two) times daily with a meal.  Dispense: 60 tablet; Refill: 0  4. Type 2 diabetes mellitus with stage 3a chronic kidney disease, with long-term current use of insulin (HCC) Lab Results  Component Value Date   HGBA1C 6.1 (H) 03/21/2020   - metFORMIN (GLUCOPHAGE-XR) 500 MG 24 hr tablet; Take 1 tablet (500 mg total) by mouth daily.  Dispense: 30 tablet; Refill: 0 - insulin glargine (LANTUS SOLOSTAR) 100 UNIT/ML Solostar Pen; Inject 10 Units into the skin at bedtime.  Dispense: 15 mL; Refill: 0 - Insulin Pen Needle 32G X 4 MM MISC; 1 each by Does not apply route as needed.  Dispense: 100 each; Refill: 0  5. Persistent atrial fibrillation (HCC) - apixaban (ELIQUIS) 2.5 MG TABS tablet; Take 1 tablet (2.5 mg total) by mouth 2 (two) times daily.  Dispense: 60 tablet; Refill: 0 - metoprolol tartrate (LOPRESSOR) 25 MG tablet; Take 0.5 tablets (12.5 mg total) by mouth 2 (two) times daily.  Dispense: 30 tablet; Refill: 0  6. Hyperlipidemia associated with type 2 diabetes mellitus (HCC) - rosuvastatin (CRESTOR) 5 MG tablet; Take 1 tablet (5 mg total) by mouth daily.  Dispense: 30 tablet; Refill: 0  7. Depression, recurrent (HCC) - FLUoxetine (PROZAC) 20 MG capsule; Take 1 capsule (20 mg total) by mouth daily.  Dispense: 30 capsule; Refill: 0  8. Hypothyroidism, unspecified type Lab Results  Component Value Date   TSH 1.80 04/02/2020   - levothyroxine (SYNTHROID) 50 MCG tablet; Take 1 tablet (50 mcg total) by mouth daily.  Dispense: 30 tablet; Refill: 0  9. Gastroesophageal reflux disease without esophagitis -  pantoprazole (PROTONIX) 40 MG tablet; Take 1 tablet (40 mg total) by mouth 2 (two) times daily before a meal.  Dispense: 60 tablet; Refill: 0    I have filled out patient's discharge paperwork and e-prescribed medications.  Patient will have home health PT and OT.  DME provided:  cane  Total discharge time: Greater than 30 minutes Greater than 50% was spent in counseling and coordination of care.   Discharge time involved coordination of the discharge process with social worker, nursing staff and therapy department. Medical justification for home health services/DME verified.   Durenda Age, DNP, MSN, FNP-BC [facility] [phone] (Monday-Friday 8:00 a.m. - 5:00 p.m.) [phone] (after hours)

## 2020-04-14 MED ORDER — HYDROCODONE-ACETAMINOPHEN 5-325 MG PO TABS: 1.0000 | ORAL_TABLET | Freq: Four times a day (QID) | ORAL | 0 refills | Status: DC | PRN

## 2020-04-14 MED ORDER — APIXABAN 2.5 MG PO TABS: 2.5000 mg | ORAL_TABLET | Freq: Two times a day (BID) | ORAL | 0 refills | Status: DC

## 2020-04-14 MED ORDER — COMBIGAN 0.2-0.5 % OP SOLN: 1.0000 [drp] | Freq: Two times a day (BID) | OPHTHALMIC | 0 refills | Status: DC

## 2020-04-14 MED ORDER — METOPROLOL TARTRATE 25 MG PO TABS: 12.5000 mg | ORAL_TABLET | Freq: Two times a day (BID) | ORAL | 0 refills | Status: DC

## 2020-04-14 MED ORDER — FERROUS SULFATE 325 (65 FE) MG PO TABS
325.0000 mg | ORAL_TABLET | Freq: Two times a day (BID) | ORAL | 0 refills | Status: DC
Start: 2020-04-14 — End: 2020-08-29

## 2020-04-14 MED ORDER — LEVOTHYROXINE SODIUM 50 MCG PO TABS: 50.0000 ug | ORAL_TABLET | Freq: Every day | ORAL | 0 refills | Status: DC

## 2020-04-14 MED ORDER — LANTUS SOLOSTAR 100 UNIT/ML ~~LOC~~ SOPN: 10.0000 [IU] | PEN_INJECTOR | Freq: Every day | SUBCUTANEOUS | 0 refills | Status: DC

## 2020-04-14 MED ORDER — PANTOPRAZOLE SODIUM 40 MG PO TBEC: 40.0000 mg | DELAYED_RELEASE_TABLET | Freq: Two times a day (BID) | ORAL | 0 refills | Status: DC

## 2020-04-14 MED ORDER — ROSUVASTATIN CALCIUM 5 MG PO TABS: 5.0000 mg | ORAL_TABLET | Freq: Every day | ORAL | 0 refills | Status: DC

## 2020-04-14 MED ORDER — INSULIN PEN NEEDLE 32G X 4 MM MISC
1.0000 | 0 refills | Status: DC | PRN
Start: 2020-04-14 — End: 2020-05-24

## 2020-04-14 MED ORDER — ONDANSETRON HCL 4 MG PO TABS: 4.0000 mg | ORAL_TABLET | Freq: Four times a day (QID) | ORAL | 0 refills | Status: DC | PRN

## 2020-04-14 MED ORDER — LOSARTAN POTASSIUM 50 MG PO TABS: 50.0000 mg | ORAL_TABLET | Freq: Two times a day (BID) | ORAL | 0 refills | Status: DC

## 2020-04-14 MED ORDER — FLUOXETINE HCL 20 MG PO CAPS: 20.0000 mg | ORAL_CAPSULE | Freq: Every day | ORAL | 0 refills | Status: DC

## 2020-04-14 MED ORDER — METFORMIN HCL ER 500 MG PO TB24: 500.0000 mg | ORAL_TABLET | Freq: Every day | ORAL | 0 refills | Status: DC

## 2020-04-17 NOTE — Addendum Note (Signed)
Addended by: Durenda Age C on: 04/17/2020 04:57 PM   Modules accepted: Level of Service

## 2020-04-23 ENCOUNTER — Non-Acute Institutional Stay (SKILLED_NURSING_FACILITY): Payer: Medicare PPO | Admitting: Adult Health

## 2020-04-23 ENCOUNTER — Encounter: Payer: Self-pay | Admitting: Adult Health

## 2020-04-23 DIAGNOSIS — Z794 Long term (current) use of insulin: Secondary | ICD-10-CM

## 2020-04-23 DIAGNOSIS — N3000 Acute cystitis without hematuria: Secondary | ICD-10-CM

## 2020-04-23 DIAGNOSIS — E1122 Type 2 diabetes mellitus with diabetic chronic kidney disease: Secondary | ICD-10-CM

## 2020-04-23 DIAGNOSIS — N1831 Chronic kidney disease, stage 3a: Secondary | ICD-10-CM

## 2020-04-24 ENCOUNTER — Ambulatory Visit: Payer: Self-pay | Admitting: Student

## 2020-04-24 DIAGNOSIS — S53105A Unspecified dislocation of left ulnohumeral joint, initial encounter: Secondary | ICD-10-CM

## 2020-04-26 ENCOUNTER — Encounter (HOSPITAL_COMMUNITY): Payer: Self-pay | Admitting: Student

## 2020-04-26 NOTE — Progress Notes (Signed)
I spoke with Arville Go a nurse at [facility].  Arville Go reports that patient is basically a/o, a little short time memory.  Pricilla Holm reports that Ms. Dobesh walks with a cane. Patient was tested for Covid on Monday, it was negative, patient has not been in contact with anyone with Covid to [place] knowledge. Ms Tabares will be tested on arrival.  Ms Waddle has type II diabetes, Arville Go reports that CBGs run 102- 200's, there was one evening one that was 306.

## 2020-04-26 NOTE — Pre-Procedure Instructions (Addendum)
    CARIANNA LAGUE  04/26/2020     Your procedure is scheduled on Friday, Febuary18. Report to [facility], Main Entrance or Entrance "A" at 5:30 AM.                Your surgery or procedure is scheduled to begin at 7 :30 am   Call this number if you have problems the morning of surgery: [phone]  This is the number for the Pre- Surgical Desk.   Call Jan, RN at [phone] when you receive this instruction sheet.  Tonight, Thursday ,04/26/20   Bedtime Lantus  insulin glargine (LANTUS SOLOSTAR) give 1/2 dose 5 units.  >>>>>On day of surgery-Please send patient's Medication Record with medications administrated documentation. ( this information is required prior to OR. This includes medications that may have been on hold for surgery)<<<<<     Remember:  Do not eat  after midnight.  You may drink clear liquids until 4:30 AM . Clear liquids allowed are:                     Water, Juice (non-citric and without pulp - diabetics please choose diet or no sugar options), Carbonated beverages - (diabetics please choose diet or no sugar options), Clear Tea, Black Coffee only (no creamer, milk or cream including half and half), Plain Jell-O only (diabetics please choose diet or no sugar options), Gatorade (diabetics please choose diet or no sugar options) and Plain Popsicles only    Take these medicines the morning of surgery with A SIP OF WATER: FLUoxetine (PROZAC)  levothyroxine (SYNTHROID)  pantoprazole (PROTONIX)  rosuvastatin (CRESTOR)  Use: brimonidine-timolol (COMBIGAN) Eliquis Metoprolol Tartrate  May have if needed: acetaminophen (TYLENOL) or HYDROcodone-acetaminophen (NORCO/VICODIN) Onadansetron   STOP taking Aspirin, Aspirin Products (Goody Powder, Excedrin Migraine), Ibuprofen (Advil), Naproxen (Aleve), Vitamins and Herbal Products (ie Fish Oil).     Friday, 04/27/20 AM  Check CBG . If  blood sugar is less than 70 mg/dL, you will need to treat for low blood  sugar: o Treat a low blood sugar (less than 70 mg/dL) with 4 glucose tablets, OR glucose gel.  Recheck blood sugar in 15 minutes after treatment (to make sure it is greater than 70 mg/dL). If your blood sugar is not greater than 70 mg/dL on recheck, call [phone] o  for further instructions. . Report  blood sugar to the short stay nurse when you get to Short Stay.   Special instructions:   Shower with antibacteria soap, dry off with a clean towel,  wear clean clothes, brush your teeth. No lotions, powders, colognes, deodorant, jewelry.  Do not bring valuables to the hospital.  [facility] is not responsible for any belongings or valuables.  Patient is not allowed to wear glasses, contact lens, hearing aids, dentures  in surgery.If you need your glasses to read the Consent Form, please bring the case.

## 2020-04-26 NOTE — Progress Notes (Signed)
I called Diana [name] home phone number and a code is needed.  I called Diana [name], patient's emergency contact, Diana [name] reports that Diana [name] at [facility] for rehab.  I called and spoke to Diana [name], a nurse at [facility].  Diana [name] reports that patient is A/O for the most part, sometimes she may have short term memory, but not as a rule." Diana [name] reported that Diana [name] will run 112- 240. Last A1C was 6.2 on 03/21/20.   Diana [name] is on Eliquis 2.5 twice a day for atrial fib, Diana [name] said that there are no instructions hold it. I called Dr Haddix's office and spoke to [place], who said she will contact Dr. Doreatha Martin and call me back.  Diana [name] called back and said that patient may continue Eliquis.

## 2020-04-26 NOTE — Progress Notes (Signed)
Anesthesia Chart Review: Diana [name]   Case: [id number] Date/Time: 04/27/20 0815   Procedure: REMOVAL EXTERNAL FIXATION ARM (Left )   Anesthesia type: Choice   Diagnosis: Closed dislocation of left elbow, initial encounter [S53.105A]   Pre-op diagnosis: Elbow dislocation   Location: [facility] ROOM 03 / [place] [facility]   Surgeons: Shona Needles, MD      DISCUSSION: Patient is an 87 year old female scheduled for the above procedure. She fell on 02/07/20 and sustained left elbow fractures of the lateral epicondyle and coronoid process with large joint effsion. She was placed in a sugar tong splint and sling and subsequently seen by othopedic surgeon Dr. Cline Cools and placed in a long-arm cast. On follow-up imaging she was noted to have posterior dislocation. The cast was removed, arm placed in a sling, and she was referred to Dr. Doreatha Martin and underwent ORIF of left elbow fracture dislocation and external fixation of the left elbow on 03/21/20. She was discharged 03/27/20 to [facility]  Other history includes former smoker (quit 05/02/02), HTN, HLD, DM2, renal insufficiency, OSA, afib, memory loss, hypothyroidism, breast cancer (s/p bilateral mastectomies 2001, chemotherapy via Port-a-cath) , GERD, hiatal hernia (large 01/01/19 EGD), anemia (HGB 5.2, severe ulcerative esophagitis 01/01/19 EGD).  Last cardiology visit 03/16/20 with Dr. Harrell Gave as a new patient for afib, although originally diagnosed 12/2018 during hospitalization for anemia/severe ulcerative esophagitis, metabolic encephalopathy, urinary retention. Echo 12/2018 showed normal LVEF. Afib rate controlled, and patient asymptomatic. Tolerating apixaban (CHA2DS2/VAS Stroke Risk Points= 6). Yearly follow-up recommended.   Patient is on Eliquis 2.5 mg BID for afib. Patient is currently residing at [facility]. Staff said they were not given instructions to have patient hold. PAT RN notified Dr.  Doreatha Martin'  staff who will follow-up on this.  [place] COVID-19 vaccine 07/28/19. COVID-19 testing on the day of surgery.   VS:  BP Readings from Last 3 Encounters:  04/23/20 (!) 146/77  04/13/20 (!) 146/77  04/09/20 132/78   Pulse Readings from Last 3 Encounters:  04/23/20 77  04/13/20 77  04/09/20 75    PROVIDERS: Christain Sacramento, MD is PCP  Buford Dresser, MD is cardiologist   LABS: For day of surgery. As of 04/02/20, lab results included: Lab Results  Component Value Date   WBC 5.3 04/02/2020   HGB 10.4 (A) 04/02/2020   HCT 31 (A) 04/02/2020   PLT 217 04/02/2020   GLUCOSE 166 (H) 03/28/2020   NA 136 (A) 04/02/2020   K 4.1 04/02/2020   CL 103 04/02/2020   CREATININE 1.1 04/02/2020   BUN 23 (A) 04/02/2020   CO2 24 (A) 04/02/2020   TSH 1.80 04/02/2020   HGBA1C 6.1 (H) 03/21/2020    OTHER: EEG 01/03/19: IMPRESSION: This study is suggestive of moderate diffuse encephalopathy, nonspecific to etiology. No seizures or epileptiform discharges were seen throughout the recording.  EGD 01/01/19: Impression: Severe, ulcerative esophagitis (LA Grade D) with several long linear, hematin stained edematous ulcers in the mid to lower third of the esophagus. Focal incomplete, peptic appearing GE junction stricture. Lumen 14-73mm. I biospied this to exclude neoplasm which I think is unlikely. [pathology: esophageal squamous and cardiac mucosa with active nonspecific carditis and focal intestinal metaplasia which could include reflux and drug effect, no dysplasia] Large hiatal hernia with obvious Cameron's type erosions. These findings likely explain her severe anemia, no further GI testing is recommended at this point.   EKG: EKG 03/16/20 ([facility]):  Atrial fibrillation  at 75 bpm Rightward axis Septal infarct, age undetermined ST and T wave abnormality, consider inferior ischemia   CV: Carotid US 01/01/19: Summary:  - Right Carotid: Velocities in the right ICA are  consistent with a 1-39%  stenosis.  - Left Carotid: Velocities in the left ICA are consistent with a 1-39%  stenosis.  - Vertebrals: Bilateral vertebral arteries demonstrate antegrade flow.    Echo 12/31/18: IMPRESSIONS  1. Left ventricular ejection fraction, by visual estimation, is 60 to  65%. The left ventricle has normal function. Normal left ventricular size.  There is no left ventricular hypertrophy.  2. Left ventricular diastolic function could not be evaluated pattern of  LV diastolic filling.  3. Global right ventricle has normal systolic function.The right  ventricular size is not well visualized. No increase in right ventricular  wall thickness.  4. Left atrial size was normal.  5. Right atrial size was not well visualized.  6. Moderate mitral annular calcification.  7. The mitral valve was not well visualized. Trace mitral valve  regurgitation. No evidence of mitral stenosis.  8. The tricuspid valve is normal in structure. Tricuspid valve  regurgitation was not visualized by color flow Doppler.  9. The aortic valve is normal in structure. Aortic valve regurgitation  was not visualized by color flow Doppler. Structurally normal aortic  valve, with no evidence of sclerosis or stenosis.  10. The pulmonic valve was not well visualized. Pulmonic valve  regurgitation is not visualized by color flow Doppler.  11. TR signal is inadequate for assessing pulmonary artery systolic  pressure.    Nuclear stress test 07/08/07: Impression: - Perfusion defect in the inferior myocardial region is consistent with diaphragmatic attenuation.  The remaining myocardium demonstrates normal myocardial perfusion with no evidence of ischemia or infarct.  The post stress left ventricle is normal in size.  The post-rest ejection fraction is 73%.  Global left ventricular systolic function is normal.  Ventricular function is preserved.   - No EKG changes.  EKG is negative for ischemia. -  Normal myocardial perfusion scan demonstrating an attenuation artifact in the inferior region of the myocardium.  No ischemia or infarct/scar is seen in the remaining myocardium.  No prior study available for comparison.  No significant ischemia demonstrated.  This is a low risk scan.   Past Medical History:  Diagnosis Date  . Anemia   . Anemia of decreased vitamin B12 absorption 05/26/2005  . Arthritis   . Atrial fibrillation ([place])   . Breast cancer ([place]) 2001  . Cataract   . GERD (gastroesophageal reflux disease)   . History of tobacco abuse   . Hx of colonic polyps   . Hyperlipidemia   . Hypertension   . Hypothyroidism   . Memory loss   . Obesity   . OSA on CPAP 09/2007   AHI 10.6/hr overall, 43.64/hr during REM, lost weight, does not use cpap  . Osteopenia   . Renal insufficiency   . Type 2 diabetes mellitus ([place])     Past Surgical History:  Procedure Laterality Date  . BACK SURGERY  05/30/2009  . BIOPSY  01/01/2019   Procedure: BIOPSY;  Surgeon: Milus Banister, MD;  Location: [facility]COPY;  Service: Endoscopy;;  . BOWEL RESECTION  1974  . CATARACT EXTRACTION Bilateral    bilateral  . CHOLECYSTECTOMY  1974  . COLONOSCOPY    . ESOPHAGOGASTRODUODENOSCOPY N/A 01/01/2019   Procedure: ESOPHAGOGASTRODUODENOSCOPY (EGD);  Surgeon: Milus Banister, MD;  Location: [facility] ENDOSCOPY;  Service:  Endoscopy;  Laterality: N/A;  . MASTECTOMY Bilateral 2001   bilateral sentinel lymph nodes bio  . NM MYOCAR PERF WALL MOTION  06/2007   dipyridamole; perfusion defect in inferior myocardium consistent with diaphragmatic attenuation, remaining myocardium with no ischemia/infarct, EF 73%; normal, low risk scan   . ORIF ELBOW FRACTURE Left 03/21/2020   Procedure: OPEN REDUCTION INTERNAL FIXATION (ORIF) ELBOW/OLECRANON FRACTURE;  Surgeon: Shona Needles, MD;  Location: [place];  Service: Orthopedics;  Laterality: Left;  . TOTAL ABDOMINAL HYSTERECTOMY  1975  . TRANSTHORACIC ECHOCARDIOGRAM  02/2010    OT=>15%, stage 1 diastolic dysfunction; borderline RV enlargement; LA mild-mod dilated; mild mitral annular calcif & mild MR; mild TR with normal RSVP, AV moderately sclerotics    MEDICATIONS: No current facility-administered medications for this encounter.   Marland Kitchen acetaminophen (TYLENOL) 325 MG tablet  . apixaban (ELIQUIS) 2.5 MG TABS tablet  . brimonidine-timolol (COMBIGAN) 0.2-0.5 % ophthalmic solution  . ferrous sulfate 325 (65 FE) MG tablet  . FLUoxetine (PROZAC) 20 MG capsule  . HYDROcodone-acetaminophen (NORCO/VICODIN) 5-325 MG tablet  . insulin glargine (LANTUS SOLOSTAR) 100 UNIT/ML Solostar Pen  . Insulin Pen Needle 32G X 4 MM MISC  . levothyroxine (SYNTHROID) 50 MCG tablet  . losartan (COZAAR) 50 MG tablet  . metFORMIN (GLUCOPHAGE-XR) 500 MG 24 hr tablet  . metoprolol tartrate (LOPRESSOR) 25 MG tablet  . Multiple Vitamins-Minerals (PRESERVISION AREDS 2) CAPS  . ondansetron (ZOFRAN) 4 MG tablet  . pantoprazole (PROTONIX) 40 MG tablet  . polyethylene glycol (MIRALAX / GLYCOLAX) 17 g packet  . rosuvastatin (CRESTOR) 5 MG tablet    Myra Gianotti, PA-C Surgical Short Stay/Anesthesiology [facility] Phone [phone] [facility] Phone [phone] 04/26/2020 2:02 PM

## 2020-04-26 NOTE — Anesthesia Preprocedure Evaluation (Addendum)
Anesthesia Evaluation  Patient identified by MRN, date of birth, ID band Patient confused    Reviewed: Allergy & Precautions, NPO status , Patient's Chart, lab work & pertinent test results  Airway Mallampati: II  TM Distance: >3 FB Neck ROM: Full    Dental  (+) Teeth Intact   Pulmonary sleep apnea and Continuous Positive Airway Pressure Ventilation , former smoker,    Pulmonary exam normal        Cardiovascular hypertension, Pt. on medications and Pt. on home beta blockers + dysrhythmias Atrial Fibrillation  Rhythm:Regular Rate:Normal     Neuro/Psych Depression negative neurological ROS     GI/Hepatic Neg liver ROS, hiatal hernia, PUD, GERD  Medicated,  Endo/Other  diabetes, Type 2, Insulin Dependent, Oral Hypoglycemic AgentsHypothyroidism   Renal/GU negative Renal ROS  negative genitourinary   Musculoskeletal  (+) Arthritis , Elbow dislocation   Abdominal (+)  Abdomen: soft. Bowel sounds: normal.  Peds  Hematology  (+) anemia ,   Anesthesia Other Findings   Reproductive/Obstetrics                          Anesthesia Physical Anesthesia Plan  ASA: III  Anesthesia Plan: General   Post-op Pain Management:    Induction: Intravenous  PONV Risk Score and Plan: 2 and Ondansetron and Propofol infusion  Airway Management Planned: Mask and LMA  Additional Equipment: None  Intra-op Plan:   Post-operative Plan: Extubation in OR  Informed Consent: I have reviewed the patients History and Physical, chart, labs and discussed the procedure including the risks, benefits and alternatives for the proposed anesthesia with the patient or authorized representative who has indicated his/her understanding and acceptance.     Dental advisory given  Plan Discussed with: CRNA  Anesthesia Plan Comments: (ECHO 2020: 1. Left ventricular ejection fraction, by visual estimation, is 60 to  65%. The  left ventricle has normal function. Normal left ventricular size.  There is no left ventricular hypertrophy.  2. Left ventricular diastolic function could not be evaluated pattern of  LV diastolic filling.  3. Global right ventricle has normal systolic function.The right  ventricular size is not well visualized. No increase in right ventricular  wall thickness.  4. Left atrial size was normal.  5. Right atrial size was not well visualized.  6. Moderate mitral annular calcification.  7. The mitral valve was not well visualized. Trace mitral valve  regurgitation. No evidence of mitral stenosis.  8. The tricuspid valve is normal in structure. Tricuspid valve  regurgitation was not visualized by color flow Doppler.  9. The aortic valve is normal in structure. Aortic valve regurgitation  was not visualized by color flow Doppler. Structurally normal aortic  valve, with no evidence of sclerosis or stenosis.  10. The pulmonic valve was not well visualized. Pulmonic valve  regurgitation is not visualized by color flow Doppler.  11. TR signal is inadequate for assessing pulmonary artery systolic  pressure.   PAT note written 04/26/2020 by Myra Gianotti, PA-C. Lab Results      Component                Value               Date                      WBC  5.3                 04/02/2020                HGB                      10.4 (A)            04/02/2020                HCT                      31 (A)              04/02/2020                MCV                      95.1                03/27/2020                PLT                      217                 04/02/2020           Lab Results      Component                Value               Date                      NA                       136 (A)             04/02/2020                K                        4.1                 04/02/2020                CO2                      24 (A)              04/02/2020                 GLUCOSE                  166 (H)             03/28/2020                BUN                      23 (A)              04/02/2020                CREATININE               1.1                 04/02/2020  CALCIUM                  7.7 (A)             04/02/2020                GFRNONAA                 47.55               04/02/2020                GFRAA                    55.11               04/02/2020          )     Anesthesia Quick Evaluation

## 2020-04-27 ENCOUNTER — Encounter (HOSPITAL_COMMUNITY)
Admission: RE | Disposition: A | Discharge status: Home / Self Care | Payer: Self-pay | Source: Home / Self Care | Attending: Student

## 2020-04-27 ENCOUNTER — Encounter (HOSPITAL_COMMUNITY): Payer: Self-pay | Admitting: Student

## 2020-04-27 ENCOUNTER — Ambulatory Visit (HOSPITAL_COMMUNITY): Payer: Medicare PPO | Admitting: Vascular Surgery

## 2020-04-27 ENCOUNTER — Other Ambulatory Visit: Payer: Self-pay

## 2020-04-27 ENCOUNTER — Ambulatory Visit (HOSPITAL_COMMUNITY)
Admission: RE | Admit: 2020-04-27 | Discharge: 2020-04-27 | Disposition: A | Discharge status: Home / Self Care | Payer: Medicare PPO | Attending: Student | Admitting: Student

## 2020-04-27 ENCOUNTER — Ambulatory Visit (HOSPITAL_COMMUNITY): Payer: Medicare PPO

## 2020-04-27 DIAGNOSIS — Z7901 Long term (current) use of anticoagulants: Secondary | ICD-10-CM | POA: Diagnosis not present

## 2020-04-27 DIAGNOSIS — Z79890 Hormone replacement therapy (postmenopausal): Secondary | ICD-10-CM | POA: Insufficient documentation

## 2020-04-27 DIAGNOSIS — Z4789 Encounter for other orthopedic aftercare: Secondary | ICD-10-CM | POA: Insufficient documentation

## 2020-04-27 DIAGNOSIS — Z794 Long term (current) use of insulin: Secondary | ICD-10-CM | POA: Diagnosis not present

## 2020-04-27 DIAGNOSIS — Z79899 Other long term (current) drug therapy: Secondary | ICD-10-CM | POA: Diagnosis not present

## 2020-04-27 DIAGNOSIS — Z419 Encounter for procedure for purposes other than remedying health state, unspecified: Secondary | ICD-10-CM

## 2020-04-27 DIAGNOSIS — Z87891 Personal history of nicotine dependence: Secondary | ICD-10-CM | POA: Diagnosis not present

## 2020-04-27 DIAGNOSIS — Z20822 Contact with and (suspected) exposure to covid-19: Secondary | ICD-10-CM | POA: Diagnosis not present

## 2020-04-27 DIAGNOSIS — S53105A Unspecified dislocation of left ulnohumeral joint, initial encounter: Secondary | ICD-10-CM | POA: Insufficient documentation

## 2020-04-27 DIAGNOSIS — Z885 Allergy status to narcotic agent status: Secondary | ICD-10-CM | POA: Diagnosis not present

## 2020-04-27 DIAGNOSIS — S53105S Unspecified dislocation of left ulnohumeral joint, sequela: Secondary | ICD-10-CM

## 2020-04-27 HISTORY — PX: EXTERNAL FIXATION REMOVAL: SHX5040

## 2020-04-27 HISTORY — DX: Diaphragmatic hernia without obstruction or gangrene: K44.9

## 2020-04-27 LAB — BASIC METABOLIC PANEL
Anion gap: 12 (ref 5–15)
BUN: 29 mg/dL — ABNORMAL HIGH (ref 8–23)
CO2: 23 mmol/L (ref 22–32)
Calcium: 8.8 mg/dL — ABNORMAL LOW (ref 8.9–10.3)
Chloride: 101 mmol/L (ref 98–111)
Creatinine, Ser: 1.65 mg/dL — ABNORMAL HIGH (ref 0.44–1.00)
GFR, Estimated: 30 mL/min — ABNORMAL LOW (ref >60)
Glucose, Bld: 173 mg/dL — ABNORMAL HIGH (ref 70–99)
Potassium: 4.4 mmol/L (ref 3.5–5.1)
Sodium: 136 mmol/L (ref 135–145)

## 2020-04-27 LAB — CBC
HCT: 41.2 % (ref 36.0–46.0)
Hemoglobin: 12.8 g/dL (ref 12.0–15.0)
MCH: 29.9 pg (ref 26.0–34.0)
MCHC: 31.1 g/dL (ref 30.0–36.0)
MCV: 96.3 fL (ref 80.0–100.0)
Platelets: 207 10*3/uL (ref 150–400)
RBC: 4.28 MIL/uL (ref 3.87–5.11)
RDW: 14.2 % (ref 11.5–15.5)
WBC: 7.2 10*3/uL (ref 4.0–10.5)
nRBC: 0 % (ref 0.0–0.2)

## 2020-04-27 LAB — GLUCOSE, CAPILLARY
Glucose-Capillary: 172 mg/dL — ABNORMAL HIGH (ref 70–99)
Glucose-Capillary: 144 mg/dL — ABNORMAL HIGH (ref 70–99)
Glucose-Capillary: 107 mg/dL — ABNORMAL HIGH (ref 70–99)

## 2020-04-27 LAB — SARS CORONAVIRUS 2 BY RT PCR (HOSPITAL ORDER, PERFORMED IN ~~LOC~~ HOSPITAL LAB): SARS Coronavirus 2: NEGATIVE

## 2020-04-27 SURGERY — REMOVAL, EXTERNAL FIXATION DEVICE, UPPER EXTREMITY
Anesthesia: General | Laterality: Left

## 2020-04-27 MED ORDER — PROPOFOL 10 MG/ML IV BOLUS
INTRAVENOUS | Status: DC | PRN
  Administered 2020-04-27: 150 mg via INTRAVENOUS
  Administered 2020-04-27: 50 mg via INTRAVENOUS

## 2020-04-27 MED ORDER — CHLORHEXIDINE GLUCONATE 0.12 % MT SOLN
15.0000 mL | Freq: Once | OROMUCOSAL | Status: AC
  Administered 2020-04-27: 15 mL via OROMUCOSAL
  Filled 2020-04-27: qty 15

## 2020-04-27 MED ORDER — LACTATED RINGERS IV SOLN: INTRAVENOUS | Status: DC

## 2020-04-27 MED ORDER — FENTANYL CITRATE (PF) 250 MCG/5ML IJ SOLN
INTRAMUSCULAR | Status: DC | PRN
  Administered 2020-04-27: 25 ug via INTRAVENOUS

## 2020-04-27 MED ORDER — ONDANSETRON HCL 4 MG/2ML IJ SOLN
INTRAMUSCULAR | Status: AC
  Filled 2020-04-27: qty 2

## 2020-04-27 MED ORDER — LIDOCAINE 2% (20 MG/ML) 5 ML SYRINGE
INTRAMUSCULAR | Status: AC
  Filled 2020-04-27: qty 5

## 2020-04-27 MED ORDER — FENTANYL CITRATE (PF) 250 MCG/5ML IJ SOLN
INTRAMUSCULAR | Status: AC
  Filled 2020-04-27: qty 5

## 2020-04-27 MED ORDER — 0.9 % SODIUM CHLORIDE (POUR BTL) OPTIME
TOPICAL | Status: DC | PRN
  Administered 2020-04-27: 1000 mL

## 2020-04-27 MED ORDER — ORAL CARE MOUTH RINSE: 15.0000 mL | Freq: Once | OROMUCOSAL | Status: AC

## 2020-04-27 MED ORDER — ONDANSETRON HCL 4 MG/2ML IJ SOLN
INTRAMUSCULAR | Status: DC | PRN
  Administered 2020-04-27: 4 mg via INTRAVENOUS

## 2020-04-27 MED ORDER — PROPOFOL 10 MG/ML IV BOLUS
INTRAVENOUS | Status: AC
  Filled 2020-04-27: qty 20

## 2020-04-27 MED ORDER — METOPROLOL TARTRATE 12.5 MG HALF TABLET: 25.0000 mg | ORAL_TABLET | ORAL | Status: DC

## 2020-04-27 MED ORDER — EPHEDRINE SULFATE 50 MG/ML IJ SOLN
INTRAMUSCULAR | Status: DC | PRN
  Administered 2020-04-27: 10 mg via INTRAVENOUS
  Administered 2020-04-27: 5 mg via INTRAVENOUS

## 2020-04-27 MED ORDER — METOPROLOL TARTRATE 12.5 MG HALF TABLET
12.5000 mg | ORAL_TABLET | ORAL | Status: AC
  Administered 2020-04-27: 12.5 mg via ORAL
  Filled 2020-04-27: qty 1

## 2020-04-27 MED ORDER — PROPOFOL 1000 MG/100ML IV EMUL
INTRAVENOUS | Status: AC
  Filled 2020-04-27: qty 100

## 2020-04-27 MED ORDER — LIDOCAINE HCL (CARDIAC) PF 100 MG/5ML IV SOSY
PREFILLED_SYRINGE | INTRAVENOUS | Status: DC | PRN
  Administered 2020-04-27: 60 mg via INTRATRACHEAL

## 2020-04-27 MED ORDER — EPHEDRINE 5 MG/ML INJ
INTRAVENOUS | Status: AC
  Filled 2020-04-27: qty 10

## 2020-04-27 SURGICAL SUPPLY — 45 items
BNDG COHESIVE 4X5 TAN STRL (GAUZE/BANDAGES/DRESSINGS) ×2 IMPLANT
BNDG ELASTIC 3X5.8 VLCR STR LF (GAUZE/BANDAGES/DRESSINGS) ×2 IMPLANT
BNDG ELASTIC 4X5.8 VLCR STR LF (GAUZE/BANDAGES/DRESSINGS) ×2 IMPLANT
BNDG ELASTIC 6X5.8 VLCR STR LF (GAUZE/BANDAGES/DRESSINGS) ×2 IMPLANT
BNDG GAUZE ELAST 4 BULKY (GAUZE/BANDAGES/DRESSINGS) ×4 IMPLANT
BRUSH SCRUB EZ PLAIN DRY (MISCELLANEOUS) ×4 IMPLANT
CHLORAPREP W/TINT 26 (MISCELLANEOUS) ×2 IMPLANT
COVER SURGICAL LIGHT HANDLE (MISCELLANEOUS) ×4 IMPLANT
COVER WAND RF STERILE (DRAPES) ×2 IMPLANT
DRAPE C-ARM 42X72 X-RAY (DRAPES) IMPLANT
DRAPE C-ARMOR (DRAPES) ×2 IMPLANT
DRAPE IMP U-DRAPE 54X76 (DRAPES) ×4 IMPLANT
DRAPE ORTHO SPLIT 77X108 STRL (DRAPES) ×4
DRAPE SURG ORHT 6 SPLT 77X108 (DRAPES) ×2 IMPLANT
DRAPE U-SHAPE 47X51 STRL (DRAPES) ×2 IMPLANT
DRSG MEPITEL 3X4 ME34 (GAUZE/BANDAGES/DRESSINGS) ×2 IMPLANT
DRSG MEPITEL 4X7.2 (GAUZE/BANDAGES/DRESSINGS) IMPLANT
ELECT REM PT RETURN 9FT ADLT (ELECTROSURGICAL) ×2
ELECTRODE REM PT RTRN 9FT ADLT (ELECTROSURGICAL) ×1 IMPLANT
GAUZE SPONGE 4X4 12PLY STRL (GAUZE/BANDAGES/DRESSINGS) ×2 IMPLANT
GAUZE SPONGE 4X4 16PLY XRAY LF (GAUZE/BANDAGES/DRESSINGS) ×2 IMPLANT
GLOVE BIO SURGEON STRL SZ 6.5 (GLOVE) ×6 IMPLANT
GLOVE BIO SURGEON STRL SZ7.5 (GLOVE) ×8 IMPLANT
GLOVE BIOGEL PI IND STRL 7.5 (GLOVE) ×1 IMPLANT
GLOVE BIOGEL PI INDICATOR 7.5 (GLOVE) ×1
GLOVE SURG UNDER POLY LF SZ6.5 (GLOVE) ×2 IMPLANT
GOWN STRL REUS W/ TWL LRG LVL3 (GOWN DISPOSABLE) ×2 IMPLANT
GOWN STRL REUS W/TWL LRG LVL3 (GOWN DISPOSABLE) ×4
KIT BASIN OR (CUSTOM PROCEDURE TRAY) ×2 IMPLANT
KIT TURNOVER KIT B (KITS) ×2 IMPLANT
MANIFOLD NEPTUNE II (INSTRUMENTS) ×2 IMPLANT
NEEDLE 22X1 1/2 (OR ONLY) (NEEDLE) IMPLANT
NS IRRIG 1000ML POUR BTL (IV SOLUTION) ×2 IMPLANT
PACK ORTHO EXTREMITY (CUSTOM PROCEDURE TRAY) ×2 IMPLANT
PAD ARMBOARD 7.5X6 YLW CONV (MISCELLANEOUS) ×4 IMPLANT
PAD CAST 3X4 CTTN HI CHSV (CAST SUPPLIES) ×1 IMPLANT
PADDING CAST COTTON 3X4 STRL (CAST SUPPLIES) ×2
PADDING CAST COTTON 6X4 STRL (CAST SUPPLIES) ×4 IMPLANT
SPONGE LAP 18X18 RF (DISPOSABLE) ×2 IMPLANT
STAPLER VISISTAT 35W (STAPLE) ×2 IMPLANT
STRIP CLOSURE SKIN 1/2X4 (GAUZE/BANDAGES/DRESSINGS) IMPLANT
SUT PROLENE 0 CT (SUTURE) IMPLANT
TOWEL GREEN STERILE (TOWEL DISPOSABLE) ×4 IMPLANT
TOWEL GREEN STERILE FF (TOWEL DISPOSABLE) ×4 IMPLANT
UNDERPAD 30X36 HEAVY ABSORB (UNDERPADS AND DIAPERS) ×2 IMPLANT

## 2020-04-27 NOTE — Progress Notes (Signed)
Location:  [place] Room Number: 211 Place of Service:  SNF (31) Provider:  Durenda Age, DNP, FNP-BC  Patient Care Team: Christain Sacramento, MD as PCP - General (Family Medicine) Buford Dresser, MD as PCP - Cardiology (Cardiology)  Extended Emergency Contact Information Primary Emergency Contact: Callicott,Ann Address: [address], [place] Phone: [phone] Mobile Phone: [phone] Relation: Other Secondary Emergency Contact: [place] Mobile Phone: [phone] Relation: Other  Code Status:   Full Code  Goals of care: Advanced Directive information Advanced Directives 04/27/2020  Does Patient Have a Medical Advance Directive? Yes  Type of Advance Directive Living will;Out of facility DNR (pink MOST or yellow form)  Does patient want to make changes to medical advance directive? -  Copy of [place] in Chart? -  Would patient like information on creating a medical advance directive? -  Pre-existing out of facility DNR order (yellow form or pink MOST form) -     Chief Complaint  Patient presents with  . Acute Visit    Abnormal Urine Culture     HPI:  Pt is a 87 y.o. female seen today for an abnormal urine culture.  She is a short-term care resident of [facility]. She was reported to have increased in confusion. Recent BIMS score 9/15, ranging in moderate cognitive impairment. Urinalysis showed urine leuk esterase 500, urine proteins 2+, urine blood negative, urine RBC 11-20, urine WBC TNTC, urine bacteria 4+ and culture showed >100,000 CFU/mL nonlactose fermenting gram-negative rods, Enterobacter cloacae complex. No reported fever nor chills. CBGs ranging from 112 to 253. She takes Lantus 14 units at bedtime and Metformin ER 500 mg daily for diabetes mellitus.  Past Medical History:  Diagnosis Date  . Anemia   . Anemia of decreased vitamin  B12 absorption 05/26/2005  . Arthritis   . Atrial fibrillation ([place])   . Breast cancer ([place]) 2001  . Cataract   . GERD (gastroesophageal reflux disease)   . Hiatal hernia   . History of tobacco abuse   . Hx of colonic polyps   . Hyperlipidemia   . Hypertension   . Hypothyroidism   . Memory loss   . Obesity   . OSA on CPAP 09/2007   AHI 10.6/hr overall, 43.64/hr during REM, lost weight, does not use cpap  . Osteopenia   . Renal insufficiency   . Type 2 diabetes mellitus ([place])    Past Surgical History:  Procedure Laterality Date  . BACK SURGERY  05/30/2009  . BIOPSY  01/01/2019   Procedure: BIOPSY;  Surgeon: Milus Banister, MD;  Location: [facility] ENDOSCOPY;  Service: Endoscopy;;  . BOWEL RESECTION  1974  . CATARACT EXTRACTION Bilateral    bilateral  . CHOLECYSTECTOMY  1974  . COLONOSCOPY    . ESOPHAGOGASTRODUODENOSCOPY N/A 01/01/2019   Procedure: ESOPHAGOGASTRODUODENOSCOPY (EGD);  Surgeon: Milus Banister, MD;  Location: [facility] ENDOSCOPY;  Service: Endoscopy;  Laterality: N/A;  . MASTECTOMY Bilateral 2001   bilateral sentinel lymph nodes bio  . NM MYOCAR PERF WALL MOTION  06/2007   dipyridamole; perfusion defect in inferior myocardium consistent with diaphragmatic attenuation, remaining myocardium with no ischemia/infarct, EF 73%; normal, low risk scan   . ORIF ELBOW FRACTURE Left 03/21/2020   Procedure: OPEN REDUCTION INTERNAL FIXATION (ORIF) ELBOW/OLECRANON FRACTURE;  Surgeon: Shona Needles, MD;  Location: [facility];  Service: Orthopedics;  Laterality: Left;  . TOTAL ABDOMINAL HYSTERECTOMY  [place] ECHOCARDIOGRAM  02/2010   GY=>18%, stage 1 diastolic dysfunction; borderline RV enlargement; LA mild-mod dilated; mild mitral annular calcif & mild MR; mild TR with normal RSVP, AV moderately sclerotics    Allergies  Allergen Reactions  . Codeine Sulfate Nausea Only  . Other     Sodium polyoxyethylene tridecyl sulfate- PER MAR    Outpatient Encounter Medications as of  04/23/2020  Medication Sig  . acetaminophen (TYLENOL) 325 MG tablet Take 650 mg by mouth every 6 (six) hours as needed (pain).  Marland Kitchen apixaban (ELIQUIS) 2.5 MG TABS tablet Take 1 tablet (2.5 mg total) by mouth 2 (two) times daily. (Patient taking differently: Take 2.5 mg by mouth 2 (two) times daily. (0900 & 2100))  . brimonidine-timolol (COMBIGAN) 0.2-0.5 % ophthalmic solution Place 1 drop into both eyes every 12 (twelve) hours. (Patient taking differently: Place 1 drop into both eyes every 12 (twelve) hours. (0900 & 2100))  . ferrous sulfate 325 (65 FE) MG tablet Take 1 tablet (325 mg total) by mouth 2 (two) times daily with a meal. (Patient taking differently: Take 325 mg by mouth 2 (two) times daily with a meal. (0900 & 1700))  . FLUoxetine (PROZAC) 20 MG capsule Take 1 capsule (20 mg total) by mouth daily. (Patient taking differently: Take 20 mg by mouth daily. (0900))  . HYDROcodone-acetaminophen (NORCO/VICODIN) 5-325 MG tablet Take 1 tablet by mouth every 6 (six) hours as needed for severe pain.  Marland Kitchen insulin glargine (LANTUS SOLOSTAR) 100 UNIT/ML Solostar Pen Inject 10 Units into the skin at bedtime. (Patient taking differently: Inject 14 Units into the skin at bedtime. (2000))  . Insulin Pen Needle 32G X 4 MM MISC 1 each by Does not apply route as needed.  Marland Kitchen levothyroxine (SYNTHROID) 50 MCG tablet Take 1 tablet (50 mcg total) by mouth daily. (Patient taking differently: Take 50 mcg by mouth daily. (0600))  . losartan (COZAAR) 50 MG tablet Take 1 tablet (50 mg total) by mouth 2 (two) times daily. (Patient taking differently: Take 50 mg by mouth 2 (two) times daily. (0900 & 2100))  . metFORMIN (GLUCOPHAGE-XR) 500 MG 24 hr tablet Take 1 tablet (500 mg total) by mouth daily. (Patient taking differently: Take 500 mg by mouth daily. (0900))  . metoprolol tartrate (LOPRESSOR) 25 MG tablet Take 0.5 tablets (12.5 mg total) by mouth 2 (two) times daily. (Patient taking differently: Take 12.5 mg by mouth 2 (two)  times daily. (0900 & 2100))  . Multiple Vitamins-Minerals (PRESERVISION AREDS 2) CAPS Take 2 capsules by mouth daily. (Patient taking differently: Take 2 capsules by mouth daily. (0900))  . ondansetron (ZOFRAN) 4 MG tablet Take 1 tablet (4 mg total) by mouth every 6 (six) hours as needed for nausea or vomiting.  . pantoprazole (PROTONIX) 40 MG tablet Take 1 tablet (40 mg total) by mouth 2 (two) times daily before a meal. (Patient taking differently: Take 40 mg by mouth 2 (two) times daily before a meal. (0600 & 1630))  . polyethylene glycol (MIRALAX / GLYCOLAX) 17 g packet Take 17 g by mouth daily as needed for mild constipation.  . rosuvastatin (CRESTOR) 5 MG tablet Take 1 tablet (5 mg total) by mouth daily. (Patient taking differently: Take 5 mg by mouth daily at 8 pm. (2000))   No facility-administered encounter medications on file as of 04/23/2020.    Review of Systems  GENERAL: No change in appetite, no fatigue, no weight changes, no fever, chills or weakness MOUTH and THROAT: Denies  oral discomfort, gingival pain or bleeding RESPIRATORY: no cough, SOB, DOE, wheezing, hemoptysis CARDIAC: No chest pain, edema or palpitations GI: No abdominal pain, diarrhea, constipation, heart burn, nausea or vomiting GU: Denies dysuria, frequency, hematuria, incontinence, or discharge NEUROLOGICAL: Denies dizziness, syncope, numbness, or headache PSYCHIATRIC: Denies feelings of depression or anxiety. No report of hallucinations, insomnia, paranoia, or agitation    Immunization History  Administered Date(s) Administered  . Influenza, High Dose Seasonal PF 01/30/2016, 12/27/2019  . Influenza-Unspecified 01/11/2019  . PFIZER(Purple Top)SARS-COV-2 Vaccination 07/07/2019, 07/28/2019  . Pneumococcal Conjugate-13 07/25/2013  . Pneumococcal Polysaccharide-23 08/06/2017  . Tdap 10/07/2017  . Zoster 08/08/2013  . Zoster Recombinat (Shingrix) 10/07/2017   Pertinent  Health Maintenance Due  Topic Date Due   . URINE MICROALBUMIN  Never done  . COLONOSCOPY (Pts 45-84yrs Insurance coverage will need to be confirmed)  07/30/2014  . OPHTHALMOLOGY EXAM  05/09/2018  . HEMOGLOBIN A1C  09/18/2020  . FOOT EXAM  12/26/2020  . INFLUENZA VACCINE  Completed  . DEXA SCAN  Completed  . PNA vac Low Risk Adult  Completed     Vitals:   04/23/20 1657  BP: 134/78  Pulse: 77  Resp: 16  Weight: 192 lb (87.1 kg)  Height: 5\' 6"  (1.676 m)   Body mass index is 30.99 kg/m.  Physical Exam  GENERAL APPEARANCE: Well nourished. In no acute distress.  SKIN:  Left arm external pin sites dry and no redness MOUTH and THROAT: Lips are without lesions. Oral mucosa is moist and without lesions. Tongue is normal in shape, size, and color and without lesions RESPIRATORY: Breathing is even & unlabored, BS CTAB CARDIAC: RRR, no murmur,no extra heart sounds, no edema GI: Abdomen soft, normal BS, no masses, no tenderness NEUROLOGICAL: There is no tremor. Speech is clear. Alert to self, disoriented to time and place.  PSYCHIATRIC:  Affect and behavior are appropriate  Labs reviewed: Recent Labs    03/27/20 0528 03/28/20 0743 03/31/20 0000 04/02/20 0000 04/27/20 0640  NA 134* 133* 142 136* 136  K 4.7 4.8 4.5 4.1 4.4  CL 99 101 103 103 101  CO2 25 23 25* 24* 23  GLUCOSE 166* 166*  --   --  173*  BUN 36* 33* 29* 23* 29*  CREATININE 1.63* 1.68* 1.2* 1.1 1.65*  CALCIUM 8.7* 8.7* 8.1* 7.7* 8.8*   Recent Labs    02/07/20 1434  AST 21  ALT 13  ALKPHOS 69  BILITOT 1.2  PROT 6.4*  ALBUMIN 3.3*   Recent Labs    02/07/20 1434 03/21/20 0739 03/23/20 0429 03/27/20 0856 03/31/20 0000 04/02/20 0000 04/27/20 0640  WBC 10.3   < > 6.9 5.2 6.3 5.3 7.2  NEUTROABS 8.5*  --   --   --  4.50 3.70  --   HGB 14.2   < > 11.0* 11.7* 11.1* 10.4* 12.8  HCT 44.8   < > 33.7* 37.2 33* 31* 41.2  MCV 93.5   < > 94.9 95.1  --   --  96.3  PLT 223   < > 195 253 240 217 207   < > = values in this interval not displayed.   Lab  Results  Component Value Date   TSH 1.80 04/02/2020   Lab Results  Component Value Date   HGBA1C 6.1 (H) 03/21/2020   Lab Results  Component Value Date   CHOL 85 04/02/2020   HDL 35 04/02/2020   LDLCALC 38 04/02/2020   TRIG 58 04/02/2020   CHOLHDL  2.7 07/05/2009    Significant Diagnostic Results in last 30 days:  DG Elbow 2 Views Left  Result Date: 04/27/2020 CLINICAL DATA:  Removal of fixation hardware. EXAM: LEFT ELBOW - 2 VIEW; DG C-ARM 1-60 MIN Radiation exposure index: 0.60 mGy. COMPARISON:  March 21, 2020. FINDINGS: Single intraoperative fluoroscopic image of the left elbow demonstrates removal of surgical hardware. IMPRESSION: Fluoroscopic guidance provided during removal of surgical hardware. Electronically Signed   By: Marijo Conception M.D.   On: 04/27/2020 11:16   DG C-Arm 1-60 Min  Result Date: 04/27/2020 CLINICAL DATA:  Removal of fixation hardware. EXAM: LEFT ELBOW - 2 VIEW; DG C-ARM 1-60 MIN Radiation exposure index: 0.60 mGy. COMPARISON:  March 21, 2020. FINDINGS: Single intraoperative fluoroscopic image of the left elbow demonstrates removal of surgical hardware. IMPRESSION: Fluoroscopic guidance provided during removal of surgical hardware. Electronically Signed   By: Marijo Conception M.D.   On: 04/27/2020 11:16    Assessment/Plan  1. Acute cystitis without hematuria -  Will start Cipro 500 mg twice a day x7 days and Florastor 250 mg 1 capsule twice a day x10 days  2. Type 2 diabetes mellitus with stage 3a chronic kidney disease, with long-term current use of insulin (HCC) Lab Results  Component Value Date   HGBA1C 6.1 (H) 03/21/2020   -   CBC stable, continue Metformin and Lantus -    Monitor CBGs    Family/ staff Communication: Discussed plan of care with resident and charge nurse.  Labs/tests ordered: None  Goals of care:   Short-term care   Durenda Age, DNP, MSN, FNP-BC [facility] [phone]  (Monday-Friday 8:00 a.m. - 5:00 p.m.) [phone] (after hours)

## 2020-04-27 NOTE — Anesthesia Procedure Notes (Signed)
Procedure Name: LMA Insertion Date/Time: 04/27/2020 10:41 AM Performed by: Kathryne Hitch, CRNA Pre-anesthesia Checklist: Patient identified, Emergency Drugs available, Suction available and Patient being monitored Patient Re-evaluated:Patient Re-evaluated prior to induction Preoxygenation: Pre-oxygenation with 100% oxygen Induction Type: IV induction Ventilation: Mask ventilation without difficulty LMA: LMA inserted LMA Size: 4.0 Placement Confirmation: positive ETCO2 Tube secured with: Tape

## 2020-04-27 NOTE — Transfer of Care (Signed)
Immediate Anesthesia Transfer of Care Note  Patient: Diana [name]  Procedure(s) Performed: REMOVAL EXTERNAL FIXATION ARM (Left )  Patient Location: PACU  Anesthesia Type:General  Level of Consciousness: drowsy and patient cooperative  Airway & Oxygen Therapy: Patient Spontanous Breathing and Patient connected to face mask oxygen  Post-op Assessment: Report given to RN and Post -op Vital signs reviewed and stable  Post vital signs: Reviewed and stable  Last Vitals:  Vitals Value Taken Time  BP 126/71 04/27/20 1057  Temp 36.4 C 04/27/20 1057  Pulse 64 04/27/20 1058  Resp 19 04/27/20 1058  SpO2 100 % 04/27/20 1058  Vitals shown include unvalidated device data.  Last Pain:  Vitals:   04/27/20 0645  TempSrc:   PainSc: 0-No pain      Patients Stated Pain Goal: 5 (86/16/83 7290)  Complications: No complications documented.

## 2020-04-27 NOTE — Anesthesia Postprocedure Evaluation (Signed)
Anesthesia Post Note  Patient: Diana [name]  Procedure(s) Performed: REMOVAL EXTERNAL FIXATION ARM (Left )     Anesthesia Type: General Anesthetic complications: no   No complications documented.  Last Vitals:  Vitals:   04/27/20 1057 04/27/20 1115  BP: 126/71 (!) 150/87  Pulse: 63 67  Resp: 17 19  Temp: 36.4 C   SpO2: 100% 100%    Last Pain:  Vitals:   04/27/20 1115  TempSrc:   PainSc: 0-No pain                 Merlinda Frederick

## 2020-04-27 NOTE — H&P (Signed)
Orthopaedic Trauma Service (OTS) H&   Patient ID: Diana [name] MRN: [id number] DOB/AGE: Jan 08, 1934 87 y.o.  Reason for Surgery: Left elbow external fixation removal  HPI: Diana [name] is an 87 y.o. female who had a left elbow fracture dislocation in December 2021 and subsequently had a subacute dislocation event.  She underwent open reduction with external fixation on 03/21/2020.  She has subsequently been in a static external fixator since that point.  Her post operative radiographs show concentric ulnohumeral joint with no signs of subluxation.  As result I felt that we could proceed with a removal of external fixation.  No recent changes since last admission.  Past Medical History:  Diagnosis Date  . Anemia   . Anemia of decreased vitamin B12 absorption 05/26/2005  . Arthritis   . Atrial fibrillation ([place])   . Breast cancer ([place]) 2001  . Cataract   . GERD (gastroesophageal reflux disease)   . Hiatal hernia   . History of tobacco abuse   . Hx of colonic polyps   . Hyperlipidemia   . Hypertension   . Hypothyroidism   . Memory loss   . Obesity   . OSA on CPAP 09/2007   AHI 10.6/hr overall, 43.64/hr during REM, lost weight, does not use cpap  . Osteopenia   . Renal insufficiency   . Type 2 diabetes mellitus ([place])     Past Surgical History:  Procedure Laterality Date  . BACK SURGERY  05/30/2009  . BIOPSY  01/01/2019   Procedure: BIOPSY;  Surgeon: Milus Banister, MD;  Location: [facility] ENDOSCOPY;  Service: Endoscopy;;  . BOWEL RESECTION  1974  . CATARACT EXTRACTION Bilateral    bilateral  . CHOLECYSTECTOMY  1974  . COLONOSCOPY    . ESOPHAGOGASTRODUODENOSCOPY N/A 01/01/2019   Procedure: ESOPHAGOGASTRODUODENOSCOPY (EGD);  Surgeon: Milus Banister, MD;  Location: [facility] ENDOSCOPY;  Service: Endoscopy;  Laterality: N/A;  . MASTECTOMY Bilateral 2001   bilateral sentinel lymph nodes bio  . NM MYOCAR PERF WALL MOTION  06/2007   dipyridamole; perfusion defect in inferior  myocardium consistent with diaphragmatic attenuation, remaining myocardium with no ischemia/infarct, EF 73%; normal, low risk scan   . ORIF ELBOW FRACTURE Left 03/21/2020   Procedure: OPEN REDUCTION INTERNAL FIXATION (ORIF) ELBOW/OLECRANON FRACTURE;  Surgeon: Shona Needles, MD;  Location: [place];  Service: Orthopedics;  Laterality: Left;  . TOTAL ABDOMINAL HYSTERECTOMY  1975  . TRANSTHORACIC ECHOCARDIOGRAM  02/2010   ZW=>25%, stage 1 diastolic dysfunction; borderline RV enlargement; LA mild-mod dilated; mild mitral annular calcif & mild MR; mild TR with normal RSVP, AV moderately sclerotics    Family History  Problem Relation Age of Onset  . Heart attack Mother   . Heart attack Father   . Colon cancer Neg Hx     Social History:  reports that she quit smoking about 18 years ago. Her smoking use included cigarettes. She quit after 13.00 years of use. She has never used smokeless tobacco. She reports that she does not drink alcohol and does not use drugs.  Allergies:  Allergies  Allergen Reactions  . Codeine Sulfate Nausea Only  . Other     Sodium polyoxyethylene tridecyl sulfate- PER MAR    Medications:  No current facility-administered medications on file prior to encounter.   Current Outpatient Medications on File Prior to Encounter  Medication Sig Dispense Refill  . apixaban (ELIQUIS) 2.5 MG TABS tablet Take 1 tablet (2.5 mg total) by mouth 2 (two) times daily. (Patient taking differently:  Take 2.5 mg by mouth 2 (two) times daily. (0900 & 2100)) 60 tablet 0  . FLUoxetine (PROZAC) 20 MG capsule Take 1 capsule (20 mg total) by mouth daily. (Patient taking differently: Take 20 mg by mouth daily. (0900)) 30 capsule 0  . levothyroxine (SYNTHROID) 50 MCG tablet Take 1 tablet (50 mcg total) by mouth daily. (Patient taking differently: Take 50 mcg by mouth daily. (0600)) 30 tablet 0  . metoprolol tartrate (LOPRESSOR) 25 MG tablet Take 0.5 tablets (12.5 mg total) by mouth 2 (two) times  daily. (Patient taking differently: Take 12.5 mg by mouth 2 (two) times daily. (0900 & 2100)) 30 tablet 0  . ondansetron (ZOFRAN) 4 MG tablet Take 1 tablet (4 mg total) by mouth every 6 (six) hours as needed for nausea or vomiting. 10 tablet 0  . pantoprazole (PROTONIX) 40 MG tablet Take 1 tablet (40 mg total) by mouth 2 (two) times daily before a meal. (Patient taking differently: Take 40 mg by mouth 2 (two) times daily before a meal. (0600 & 1630)) 60 tablet 0  . saccharomyces boulardii (FLORASTOR) 250 MG capsule Take 250 mg by mouth 2 (two) times daily. (0600 & 1630)    . acetaminophen (TYLENOL) 325 MG tablet Take 650 mg by mouth every 6 (six) hours as needed (pain).    . bisacodyl (DULCOLAX) 10 MG suppository Place 10 mg rectally daily as needed for moderate constipation (if constipation not relieved by MOM).    . brimonidine-timolol (COMBIGAN) 0.2-0.5 % ophthalmic solution Place 1 drop into both eyes every 12 (twelve) hours. (Patient taking differently: Place 1 drop into both eyes every 12 (twelve) hours. (0900 & 2100)) 5 mL 0  . ciprofloxacin (CIPRO) 500 MG tablet Take 500 mg by mouth 2 (two) times daily. (0600 & 1630)    . ferrous sulfate 325 (65 FE) MG tablet Take 1 tablet (325 mg total) by mouth 2 (two) times daily with a meal. (Patient taking differently: Take 325 mg by mouth 2 (two) times daily with a meal. (0900 & 1700)) 60 tablet 0  . HYDROcodone-acetaminophen (NORCO/VICODIN) 5-325 MG tablet Take 1 tablet by mouth every 6 (six) hours as needed for severe pain. 20 tablet 0  . insulin glargine (LANTUS SOLOSTAR) 100 UNIT/ML Solostar Pen Inject 10 Units into the skin at bedtime. (Patient taking differently: Inject 14 Units into the skin at bedtime. (2000)) 15 mL 0  . Insulin Pen Needle 32G X 4 MM MISC 1 each by Does not apply route as needed. 100 each 0  . losartan (COZAAR) 50 MG tablet Take 1 tablet (50 mg total) by mouth 2 (two) times daily. (Patient taking differently: Take 50 mg by mouth 2  (two) times daily. (0900 & 2100)) 60 tablet 0  . magnesium hydroxide (MILK OF MAGNESIA) 400 MG/5ML suspension Take 30 mLs by mouth daily as needed for mild constipation (if no bm in 3 days).    . metFORMIN (GLUCOPHAGE-XR) 500 MG 24 hr tablet Take 1 tablet (500 mg total) by mouth daily. (Patient taking differently: Take 500 mg by mouth daily. (0900)) 30 tablet 0  . Multiple Vitamins-Minerals (PRESERVISION AREDS 2) CAPS Take 2 capsules by mouth daily. (Patient taking differently: Take 2 capsules by mouth daily. (0900)) 60 capsule 0  . polyethylene glycol (MIRALAX / GLYCOLAX) 17 g packet Take 17 g by mouth daily as needed for mild constipation. 14 each 0  . rosuvastatin (CRESTOR) 5 MG tablet Take 1 tablet (5 mg total) by mouth daily. (Patient taking  differently: Take 5 mg by mouth daily at 8 pm. (2000)) 30 tablet 0    ROS: Constitutional: No fever or chills Vision: No changes in vision ENT: No difficulty swallowing CV: No chest pain Pulm: No SOB or wheezing GI: No nausea or vomiting GU: No urgency or inability to hold urine Skin: No poor wound healing Neurologic: No numbness or tingling Psychiatric: No depression or anxiety Heme: No bruising Allergic: No reaction to medications or food   Exam: Blood pressure (!) 158/74, pulse 75, temperature 98.1 F (36.7 C), temperature source Oral, resp. rate 18, height 5\' 6"  (1.676 m), weight 87.1 kg, SpO2 97 %. General:NAD Orientation:AAOx3 Mood and Affect: Cooperative and pleasant Gait: WNL Coordination and balance: WNL   LUE: Exfix is in place the pin sites are without drainage.  There are some scabbing.  Wounds are all healed.  Full motor and sensory function.  RUE: Skin without lesions. No tenderness to palpation. Full painless ROM, full strength in each muscle groups without evidence of instability.   Medical Decision Making: Data: Imaging: Concentric ulnohumeral joint without any signs of subluxation or dislocation.  Labs:  Results for  orders placed or performed during the hospital encounter of 04/27/20 (from the past 24 hour(s))  SARS Coronavirus 2 by RT PCR (hospital order, performed in [facility] hospital lab) Nasopharyngeal Nasopharyngeal Swab     Status: None   Collection Time: 04/27/20  6:08 AM   Specimen: Nasopharyngeal Swab  Result Value Ref Range   SARS Coronavirus 2 NEGATIVE NEGATIVE  Glucose, capillary     Status: Abnormal   Collection Time: 04/27/20  6:26 AM  Result Value Ref Range   Glucose-Capillary 172 (H) 70 - 99 mg/dL  Basic metabolic panel per protocol     Status: Abnormal   Collection Time: 04/27/20  6:40 AM  Result Value Ref Range   Sodium 136 135 - 145 mmol/L   Potassium 4.4 3.5 - 5.1 mmol/L   Chloride 101 98 - 111 mmol/L   CO2 23 22 - 32 mmol/L   Glucose, Bld 173 (H) 70 - 99 mg/dL   BUN 29 (H) 8 - 23 mg/dL   Creatinine, Ser 1.65 (H) 0.44 - 1.00 mg/dL   Calcium 8.8 (L) 8.9 - 10.3 mg/dL   GFR, Estimated 30 (L) >60 mL/min   Anion gap 12 5 - 15  CBC per protocol     Status: None   Collection Time: 04/27/20  6:40 AM  Result Value Ref Range   WBC 7.2 4.0 - 10.5 K/uL   RBC 4.28 3.87 - 5.11 MIL/uL   Hemoglobin 12.8 12.0 - 15.0 g/dL   HCT 41.2 36.0 - 46.0 %   MCV 96.3 80.0 - 100.0 fL   MCH 29.9 26.0 - 34.0 pg   MCHC 31.1 30.0 - 36.0 g/dL   RDW 14.2 11.5 - 15.5 %   Platelets 207 150 - 400 K/uL   nRBC 0.0 0.0 - 0.2 %    Imaging or Labs ordered: None  Medical history and chart was reviewed and case discussed with medical provider.  Assessment/Plan: 87 year old female with a left elbow fracture dislocation status post open reduction and external fixation.  We will plan to perform external fixation removal today with placement of hinged elbow brace.  Risks and benefits were discussed with the patient's caregivers and the healthcare power of attorney.  They agreed to proceed with surgery and consent was obtained.  Patient will be discharged home from the [facility].  Diana Droege, MD [facility] [phone] (office) [email or web address]

## 2020-04-27 NOTE — Op Note (Signed)
Orthopaedic Surgery Operative Note (CSN: [id number] ) Date of Surgery: 04/27/2020  Admit Date: 04/27/2020   Diagnoses: Pre-Op Diagnoses: Subacute left elbow fracture/dislocation  Post-Op Diagnosis: Same   Procedures: CPT 20694-Removal of external fixation of left arm  Surgeons : Primary: Shona Needles, MD  Assistant: Patrecia Pace, PA-C  Location: OR 4   Anesthesia:General  Antibiotics: None needed   Tourniquet time:None  Estimated Blood Loss:Minimal  Complications:None  Specimens:None  Implants: * No implants in log *   Indications for Surgery: 87 year old female who had a subacute elbow dislocation with associated fracture underwent open reduction with external fixation in March 21, 2020.  She has been in the external fixator for over 4 weeks.  I felt that she was indicated for exfix removal.  The risks and benefits were discussed with her and her caregivers and healthcare power of attorney.  Risks included recurrent dislocation, need for continued external fixation, need for casting, need for bracing, nerve or blood vessel injury, even the possibility of anesthetic complications.  They agreed to proceed with surgery and consent was obtained.  Operative Findings: Removal of left elbow external fixation with a stable ulnohumeral joint under stress fluoroscopic imaging.  Procedure: The patient was identified in the preoperative holding area. Consent was confirmed with the patient and their family and all questions were answered. The operative extremity was marked after confirmation with the patient. she was then brought back to the operating room by our anesthesia colleagues.  She was placed under anesthesia.  She was carefully transferred over to a radiolucent flat top table.  Fluoroscopic imaging was obtained that showed a concentric ulnohumeral joint.  The external fixation was then loosened and the elbow was brought through a range of motion.  There is no signs of  subluxation or dislocation at about 40 degrees of extension.  I did not stress the elbow any further than this.  Flexion had no issues.  The the threaded Schanz pins were then removed.  The pin sites were then debrided with a curette.  They were irrigated.  They were covered with a sterile dressings and a dressing was placed and applied to the left upper extremity.  She was then awoken from anesthesia and taken to the PACU in stable condition.   Post Op Plan/Instructions: Patient will be nonweightbearing to left upper extremity and she will be fitted for a hinged elbow brace with extension limited to 40 degrees.  Unrestricted flexion.  She will remain on her Eliquis.  We will plan to see her back in about 2 weeks for repeat x-rays.  I was present and performed the entire surgery.  Patrecia Pace, PA-C did assist me throughout the case. An assistant was necessary given the difficulty in approach, maintenance of reduction and ability to instrument the fracture.   Katha Hamming, MD [facility]

## 2020-04-27 NOTE — Progress Notes (Signed)
Orthopedic Tech Progress Note Patient Details:  CATHYRN DEAS 1934/01/19 [id number] Already ordered brace from [facility] Patient ID: [name], female   DOB: 01/14/1934, 87 y.o.   MRN: [id number]   [name] 04/27/2020, 11:09 AM

## 2020-04-27 NOTE — Progress Notes (Signed)
Orthopedic Tech Progress Note Patient Details:  Diana [name] Jun 15, 1933 [id number] Called in order to [facility] for a [place]. Patient ID: Diana [name], female   DOB: Jul 10, 1933, 87 y.o.   MRN: [id number]   [name] 04/27/2020, 9:10 AM

## 2020-04-27 NOTE — Progress Notes (Signed)
Spoke with Seth Bake at [facility] to review patient's medication.  Seth Bake stated that she would have the night shift nurse return my call because she was not able to answer my questions.

## 2020-04-27 NOTE — Discharge Instructions (Addendum)
Or[facility]opaedic [facility]uma [facility] Discharge Instructions   General Discharge Instructions  WEIGHT BEARING STATUS: Non-weightbearing left upper extremity  RANGE OF MOTION/ACTIVITY: Keep hinge elbow brace in place, no more than 40 degrees elbow extension. Ok for unrestricted elbow flexion in brace  Wound Care: Keep dressing in place until Monday 04/30/20. You may remove dressing at that time and continue to change daily. - Do NOT apply any ointments, solutions or lotions to pin sites or surgical wounds.  These prevent needed drainage and even though solutions like hydrogen peroxide kill bacteria, they also damage cells lining the pin sites that help fight infection.  Applying lotions or ointments can keep the wounds moist and can cause them to breakdown and open up as well. This can increase the risk for infection. When in doubt call the office.  - Clean pin site holes daily. If any drainage is noted, use one layer of adaptic, then gauze, Kerlix, and an ace wrap.  The adaptic can be discontinued once the draining has ceased  - Once the incision is completely dry and without drainage, it may be left open to air out.  Showering may begin 36-48 hours later.  Cleaning gently with soap and water.  - Notify the office/Doctor if you experience increasing drainage, redness, or pain from a pin site, or if you notice purulent (thick, snot-like) drainage.  DVT/PE prophylaxis: Eliquis  Diet: as you were eating previously.  Can use over the counter stool softeners and bowel preparations, such as Miralax, to help with bowel movements.  Narcotics can be constipating.  Be sure to drink plenty of fluids  PAIN MEDICATION USE AND EXPECTATIONS  You have likely been given narcotic medications to help control your pain.  After a traumatic event that results in an fracture (broken bone) with or without surgery, it is ok to use narcotic pain medications to help control one's pain.  We understand that everyone responds to  pain differently and each individual patient will be evaluated on a regular basis for the continued need for narcotic medications. Ideally, narcotic medication use should last no more than 6-8 weeks (coinciding with fracture healing).   As a patient it is your responsibility as well to monitor narcotic medication use and report the amount and frequency you use these medications when you come to your office visit.   We would also advise that if you are using narcotic medications, you should take a dose prior to therapy to maximize you participation.  IF YOU ARE ON NARCOTIC MEDICATIONS IT IS NOT PERMISSIBLE TO OPERATE A MOTOR VEHICLE (MOTORCYCLE/CAR/TRUCK/MOPED) OR HEAVY MACHINERY DO NOT MIX NARCOTICS WITH OTHER CNS (CENTRAL NERVOUS SYSTEM) DEPRESSANTS SUCH AS ALCOHOL   STOP SMOKING OR USING NICOTINE PRODUCTS!!!!  As discussed nicotine severely impairs your body's ability to heal surgical and traumatic wounds but also impairs bone healing.  Wounds and bone heal by forming microscopic blood vessels (angiogenesis) and nicotine is a vasoconstrictor (essentially, shrinks blood vessels).  Therefore, if vasoconstriction occurs to these microscopic blood vessels they essentially disappear and are unable to deliver necessary nutrients to the healing tissue.  This is one modifiable factor that you can do to dramatically increase your chances of healing your injury.    (This means no smoking, no nicotine gum, patches, etc)  ICE AND ELEVATE INJURED/OPERATIVE EXTREMITY  Using ice and elevating the injured extremity above your heart can help with swelling and pain control.  Icing in a pulsatile fashion, such as 20 minutes on and 20 minutes  off, can be followed.    Do not place ice directly on skin. Make sure there is a barrier between to skin and the ice pack.    Using frozen items such as frozen peas works well as the conform nicely to the are that needs to be iced.  USE AN ACE WRAP OR TED HOSE FOR SWELLING  CONTROL  In addition to icing and elevation, Ace wraps or TED hose are used to help limit and resolve swelling.  It is recommended to use Ace wraps or TED hose until you are informed to stop.    When using Ace Wraps start the wrapping distally (farthest away from the body) and wrap proximally (closer to the body)   Example: If you had surgery on your leg or thing and you do not have a splint on, start the ace wrap at the toes and work your way up to the thigh        If you had surgery on your upper extremity and do not have a splint on, start the ace wrap at your fingers and work your way up to the upper arm  [place]: [phone]   VISIT OUR WEBSITE FOR ADDITIONAL INFORMATION: [email or web address]

## 2020-04-28 ENCOUNTER — Encounter (HOSPITAL_COMMUNITY): Payer: Self-pay | Admitting: Student

## 2020-05-04 ENCOUNTER — Encounter: Payer: Self-pay | Admitting: Adult Health

## 2020-05-04 ENCOUNTER — Non-Acute Institutional Stay (SKILLED_NURSING_FACILITY): Payer: Medicare PPO | Admitting: Adult Health

## 2020-05-04 DIAGNOSIS — E1122 Type 2 diabetes mellitus with diabetic chronic kidney disease: Secondary | ICD-10-CM

## 2020-05-04 DIAGNOSIS — S53105S Unspecified dislocation of left ulnohumeral joint, sequela: Secondary | ICD-10-CM | POA: Diagnosis not present

## 2020-05-04 DIAGNOSIS — I4819 Other persistent atrial fibrillation: Secondary | ICD-10-CM

## 2020-05-04 DIAGNOSIS — Z794 Long term (current) use of insulin: Secondary | ICD-10-CM

## 2020-05-04 DIAGNOSIS — R29818 Other symptoms and signs involving the nervous system: Secondary | ICD-10-CM

## 2020-05-04 DIAGNOSIS — I10 Essential (primary) hypertension: Secondary | ICD-10-CM

## 2020-05-04 DIAGNOSIS — D62 Acute posthemorrhagic anemia: Secondary | ICD-10-CM

## 2020-05-04 DIAGNOSIS — N1831 Chronic kidney disease, stage 3a: Secondary | ICD-10-CM

## 2020-05-04 DIAGNOSIS — E039 Hypothyroidism, unspecified: Secondary | ICD-10-CM

## 2020-05-04 DIAGNOSIS — S53105D Unspecified dislocation of left ulnohumeral joint, subsequent encounter: Secondary | ICD-10-CM

## 2020-05-04 DIAGNOSIS — R4189 Other symptoms and signs involving cognitive functions and awareness: Secondary | ICD-10-CM

## 2020-05-04 NOTE — Progress Notes (Signed)
Location:  [facility] Room Number: 211/A Place of Service:  SNF (31) Provider:  Durenda Age, DNP, FNP-BC  Patient Care Team: Christain Sacramento, MD as PCP - General (Family Medicine) Buford Dresser, MD as PCP - Cardiology (Cardiology)  Extended Emergency Contact Information Primary Emergency Contact: Callicott,Ann Address: [address], [place] Phone: [phone] Mobile Phone: [phone] Relation: Other Secondary Emergency Contact: [place] Mobile Phone: [phone] Relation: Other  Code Status: Full Code   Goals of care: Advanced Directive information Advanced Directives 05/04/2020  Does Patient Have a Medical Advance Directive? No  Type of Advance Directive -  Does patient want to make changes to medical advance directive? No - Patient declined  Copy of [place] in Chart? -  Would patient like information on creating a medical advance directive? -  Pre-existing out of facility DNR order (yellow form or pink MOST form) -     Chief Complaint  Patient presents with  . Acute Visit    Assessment for Assisted Living Facility Placement     HPI:  Pt is a 87 y.o. female seen today for for assessment for her application to an assisted living facility. She was admitted to [facility] on 03/28/20 post [facility] hospitalization 03/21/2020 to 03/28/2020  S/P fall at home sustaining a closed dislocation of left elbow for which she had ORIF and internal fixation on 03/21/2020.  She was first placed in an external fixator to stabilize the elbow joint. Removal of external fixation of left arm was done on 04/27/20. She was put on arm brace and nonweightbearing to LUE.   Past Medical History:  Diagnosis Date  . Anemia   . Anemia of decreased vitamin B12 absorption 05/26/2005  . Arthritis   . Atrial fibrillation ([place])   . Breast cancer ([place]) 2001  . Cataract   . GERD  (gastroesophageal reflux disease)   . Hiatal hernia   . History of tobacco abuse   . Hx of colonic polyps   . Hyperlipidemia   . Hypertension   . Hypothyroidism   . Memory loss   . Obesity   . OSA on CPAP 09/2007   AHI 10.6/hr overall, 43.64/hr during REM, lost weight, does not use cpap  . Osteopenia   . Renal insufficiency   . Type 2 diabetes mellitus ([place])    Past Surgical History:  Procedure Laterality Date  . BACK SURGERY  05/30/2009  . BIOPSY  01/01/2019   Procedure: BIOPSY;  Surgeon: Milus Banister, MD;  Location: [facility] ENDOSCOPY;  Service: Endoscopy;;  . BOWEL RESECTION  1974  . CATARACT EXTRACTION Bilateral    bilateral  . CHOLECYSTECTOMY  1974  . COLONOSCOPY    . ESOPHAGOGASTRODUODENOSCOPY N/A 01/01/2019   Procedure: ESOPHAGOGASTRODUODENOSCOPY (EGD);  Surgeon: Milus Banister, MD;  Location[facility] ENDOSCOPY;  Service: Endoscopy;  Laterality: N/A;  . EXTERNAL FIXATION REMOVAL Left 04/27/2020   Procedure: REMOVAL EXTERNAL FIXATION ARM;  Surgeon: Shona Needles, MD;  Location: [facility];  Service: Orthopedics;  Laterality: Left;  Marland Kitchen MASTECTOMY Bilateral 2001   bilateral sentinel lymph nodes bio  . NM MYOCAR PERF WALL MOTION  06/2007   dipyridamole; perfusion defect in inferior myocardium consistent with diaphragmatic attenuation, remaining myocardium with no ischemia/infarct, EF 73%; normal, low risk scan   . ORIF ELBOW FRACTURE Left 03/21/2020   Procedure: OPEN REDUCTION INTERNAL FIXATION (ORIF) ELBOW/OLECRANON FRACTURE;  Surgeon: Katha Hamming  P, MD;  Location: [place];  Service: Orthopedics;  Laterality: Left;  . TOTAL ABDOMINAL HYSTERECTOMY  1975  . TRANSTHORACIC ECHOCARDIOGRAM  02/2010   JY=>78%, stage 1 diastolic dysfunction; borderline RV enlargement; LA mild-mod dilated; mild mitral annular calcif & mild MR; mild TR with normal RSVP, AV moderately sclerotics    Allergies  Allergen Reactions  . Codeine Sulfate Nausea Only  . Other     Sodium polyoxyethylene tridecyl  sulfate- PER MAR    Outpatient Encounter Medications as of 05/04/2020  Medication Sig  . acetaminophen (TYLENOL) 325 MG tablet Take 650 mg by mouth every 6 (six) hours as needed (pain).  Marland Kitchen apixaban (ELIQUIS) 2.5 MG TABS tablet Take 1 tablet (2.5 mg total) by mouth 2 (two) times daily.  . bisacodyl (DULCOLAX) 10 MG suppository Place 10 mg rectally daily as needed for moderate constipation (if constipation not relieved by MOM).  . brimonidine-timolol (COMBIGAN) 0.2-0.5 % ophthalmic solution Place 1 drop into both eyes every 12 (twelve) hours.  . ferrous sulfate 325 (65 FE) MG tablet Take 1 tablet (325 mg total) by mouth 2 (two) times daily with a meal.  . FLUoxetine (PROZAC) 20 MG capsule Take 1 capsule (20 mg total) by mouth daily.  Marland Kitchen HYDROcodone-acetaminophen (NORCO/VICODIN) 5-325 MG tablet Take 1 tablet by mouth every 6 (six) hours as needed for severe pain.  Marland Kitchen insulin glargine (LANTUS) 100 UNIT/ML Solostar Pen Inject 14 Units into the skin at bedtime.  Marland Kitchen levothyroxine (SYNTHROID) 50 MCG tablet Take 1 tablet (50 mcg total) by mouth daily.  Marland Kitchen losartan (COZAAR) 50 MG tablet Take 1 tablet (50 mg total) by mouth 2 (two) times daily.  . magnesium hydroxide (MILK OF MAGNESIA) 400 MG/5ML suspension Take 30 mLs by mouth daily as needed for mild constipation (if no bm in 3 days).  . metFORMIN (GLUCOPHAGE-XR) 500 MG 24 hr tablet Take 1 tablet (500 mg total) by mouth daily.  . metoprolol tartrate (LOPRESSOR) 25 MG tablet Take 0.5 tablets (12.5 mg total) by mouth 2 (two) times daily.  . Multiple Vitamins-Minerals (HEALTHY EYES SUPERVISION 2 PO) Take 2 capsules by mouth daily.  . ondansetron (ZOFRAN) 4 MG tablet Take 1 tablet (4 mg total) by mouth every 6 (six) hours as needed for nausea or vomiting.  . pantoprazole (PROTONIX) 40 MG tablet Take 1 tablet (40 mg total) by mouth 2 (two) times daily before a meal.  . polyethylene glycol (MIRALAX / GLYCOLAX) 17 g packet Take 17 g by mouth daily as needed for mild  constipation.  . rosuvastatin (CRESTOR) 5 MG tablet Take 1 tablet (5 mg total) by mouth daily.  . Insulin Pen Needle 32G X 4 MM MISC 1 each by Does not apply route as needed. (Patient not taking: Reported on 05/04/2020)  . [DISCONTINUED] ciprofloxacin (CIPRO) 500 MG tablet Take 500 mg by mouth 2 (two) times daily. (0600 & 1630)  . [DISCONTINUED] insulin glargine (LANTUS SOLOSTAR) 100 UNIT/ML Solostar Pen Inject 10 Units into the skin at bedtime.  . [DISCONTINUED] Multiple Vitamins-Minerals (PRESERVISION AREDS 2) CAPS Take 2 capsules by mouth daily.  . [DISCONTINUED] saccharomyces boulardii (FLORASTOR) 250 MG capsule Take 250 mg by mouth 2 (two) times daily. (0600 & 1630)   No facility-administered encounter medications on file as of 05/04/2020.    Review of Systems  GENERAL: No change in appetite, no fatigue, no weight changes, no fever, chills or weakness MOUTH and THROAT: Denies oral discomfort, gingival pain or bleeding RESPIRATORY: no cough, SOB, DOE, wheezing, hemoptysis  CARDIAC: No chest pain, edema or palpitations GI: No abdominal pain, diarrhea, constipation, heart burn, nausea or vomiting GU: Denies dysuria, frequency, hematuria or discharge NEUROLOGICAL: Denies dizziness, syncope, numbness, or headache PSYCHIATRIC: Denies feelings of depression or anxiety. No report of hallucinations, insomnia, paranoia, or agitation   Immunization History  Administered Date(s) Administered  . Influenza, High Dose Seasonal PF 01/30/2016, 12/27/2019  . Influenza-Unspecified 01/11/2019  . PFIZER(Purple Top)SARS-COV-2 Vaccination 07/07/2019, 07/28/2019  . Pneumococcal Conjugate-13 07/25/2013  . Pneumococcal Polysaccharide-23 08/06/2017  . Tdap 10/07/2017  . Zoster 08/08/2013  . Zoster Recombinat (Shingrix) 10/07/2017   Pertinent  Health Maintenance Due  Topic Date Due  . COLONOSCOPY (Pts 45-28yrs Insurance coverage will need to be confirmed)  07/30/2014  . OPHTHALMOLOGY EXAM  05/09/2018  .  HEMOGLOBIN A1C  09/18/2020  . FOOT EXAM  12/26/2020  . INFLUENZA VACCINE  Completed  . DEXA SCAN  Completed  . PNA vac Low Risk Adult  Completed   No flowsheet data found.   Vitals:   05/04/20 1516  BP: (!) 147/85  Pulse: 77  Resp: 20  Temp: (!) 96.4 F (35.8 C)  Weight: 191 lb (86.6 kg)  Height: 5\' 6"  (1.676 m)   Body mass index is 30.83 kg/m.  Physical Exam  GENERAL APPEARANCE: Well nourished. In no acute distress. Obese SKIN:  Skin is warm and dry.  MOUTH and THROAT: Lips are without lesions. Oral mucosa is moist and without lesions. Tongue is normal in shape, size, and color and without lesions RESPIRATORY: Breathing is even & unlabored, BS CTAB CARDIAC: RRR, no murmur,no extra heart sounds, no edema GI: Abdomen soft, normal BS, no masses, no tenderness EXTREMITIES:  LUE on brace NEUROLOGICAL: There is no tremor. Speech is clear. Alert to self, disoriented to time and place. PSYCHIATRIC:  Affect and behavior are appropriate  Labs reviewed: Recent Labs    03/27/20 0528 03/28/20 0743 03/31/20 0000 04/02/20 0000 04/27/20 0640  NA 134* 133* 142 136* 136  K 4.7 4.8 4.5 4.1 4.4  CL 99 101 103 103 101  CO2 25 23 25* 24* 23  GLUCOSE 166* 166*  --   --  173*  BUN 36* 33* 29* 23* 29*  CREATININE 1.63* 1.68* 1.2* 1.1 1.65*  CALCIUM 8.7* 8.7* 8.1* 7.7* 8.8*   Recent Labs    02/07/20 1434  AST 21  ALT 13  ALKPHOS 69  BILITOT 1.2  PROT 6.4*  ALBUMIN 3.3*   Recent Labs    02/07/20 1434 03/21/20 0739 03/23/20 0429 03/27/20 0856 03/31/20 0000 04/02/20 0000 04/27/20 0640  WBC 10.3   < > 6.9 5.2 6.3 5.3 7.2  NEUTROABS 8.5*  --   --   --  4.50 3.70  --   HGB 14.2   < > 11.0* 11.7* 11.1* 10.4* 12.8  HCT 44.8   < > 33.7* 37.2 33* 31* 41.2  MCV 93.5   < > 94.9 95.1  --   --  96.3  PLT 223   < > 195 253 240 217 207   < > = values in this interval not displayed.   Lab Results  Component Value Date   TSH 1.80 04/02/2020   Lab Results  Component Value Date    HGBA1C 6.1 (H) 03/21/2020   Lab Results  Component Value Date   CHOL 85 04/02/2020   HDL 35 04/02/2020   LDLCALC 38 04/02/2020   TRIG 58 04/02/2020   CHOLHDL 2.7 07/05/2009    Significant Diagnostic Results in  last 30 days:  DG Elbow 2 Views Left  Result Date: 04/27/2020 CLINICAL DATA:  Removal of fixation hardware. EXAM: LEFT ELBOW - 2 VIEW; DG C-ARM 1-60 MIN Radiation exposure index: 0.60 mGy. COMPARISON:  March 21, 2020. FINDINGS: Single intraoperative fluoroscopic image of the left elbow demonstrates removal of surgical hardware. IMPRESSION: Fluoroscopic guidance provided during removal of surgical hardware. Electronically Signed   By: Marijo Conception M.D.   On: 04/27/2020 11:16   DG C-Arm 1-60 Min  Result Date: 04/27/2020 CLINICAL DATA:  Removal of fixation hardware. EXAM: LEFT ELBOW - 2 VIEW; DG C-ARM 1-60 MIN Radiation exposure index: 0.60 mGy. COMPARISON:  March 21, 2020. FINDINGS: Single intraoperative fluoroscopic image of the left elbow demonstrates removal of surgical hardware. IMPRESSION: Fluoroscopic guidance provided during removal of surgical hardware. Electronically Signed   By: Marijo Conception M.D.   On: 04/27/2020 11:16    Assessment/Plan  1. Closed dislocation of left elbow, sequela -  S/P ORIF and external fixation of left elbow on 03/21/2020 -   S/P removal of external fixation of left arm on 04/27/2020 -  Has LUE brace and nonweightbearing on LUE  2. Persistent atrial fibrillation (HCC) -Rate controlled, continue metoprolol tartrate for rate control and Eliquis for anticoagulation  3. Primary hypertension -   Continue metoprolol tartrate and losartan -   Monitor BPs  4. Type 2 diabetes mellitus with stage 3a chronic kidney disease, with long-term current use of insulin (HCC) Lab Results  Component Value Date   HGBA1C 6.1 (H) 03/21/2020   -   CBGs stable, continue Lantus and Metformin  5. Hypothyroidism, unspecified type Lab Results  Component  Value Date   TSH 1.80 04/02/2020   -   Continue levothyroxine  6. Postoperative anemia due to acute blood loss Lab Results  Component Value Date   WBC 7.2 04/27/2020   HGB 12.8 04/27/2020   HCT 41.2 04/27/2020   MCV 96.3 04/27/2020   PLT 207 04/27/2020   -   Continue ferrous sulfate  7. Neurocognitive deficits -   BIMS score 9/15, ranging in moderate cognitive impairment -    Continue supportive care      Family/ staff Communication:   Discussed plan of care with resident and charge nurse.  Labs/tests ordered: None  Goals of care:   Short-term care   Durenda Age, DNP, MSN, FNP-BC [facility] [phone] (Monday-Friday 8:00 a.m. - 5:00 p.m.) [phone] (after hours)

## 2020-05-14 ENCOUNTER — Encounter: Payer: Self-pay | Admitting: Adult Health

## 2020-05-14 ENCOUNTER — Non-Acute Institutional Stay (SKILLED_NURSING_FACILITY): Payer: Medicare PPO | Admitting: Adult Health

## 2020-05-14 DIAGNOSIS — E1122 Type 2 diabetes mellitus with diabetic chronic kidney disease: Secondary | ICD-10-CM | POA: Diagnosis not present

## 2020-05-14 DIAGNOSIS — I10 Essential (primary) hypertension: Secondary | ICD-10-CM

## 2020-05-14 DIAGNOSIS — E039 Hypothyroidism, unspecified: Secondary | ICD-10-CM | POA: Diagnosis not present

## 2020-05-14 DIAGNOSIS — I4819 Other persistent atrial fibrillation: Secondary | ICD-10-CM

## 2020-05-14 DIAGNOSIS — S42402S Unspecified fracture of lower end of left humerus, sequela: Secondary | ICD-10-CM

## 2020-05-14 DIAGNOSIS — Z794 Long term (current) use of insulin: Secondary | ICD-10-CM

## 2020-05-14 DIAGNOSIS — N1831 Chronic kidney disease, stage 3a: Secondary | ICD-10-CM

## 2020-05-14 NOTE — Progress Notes (Signed)
Location:  [place] Room Number: 211/A Place of Service:  SNF (31) Provider:  Durenda Age, DNP, FNP-BC  Patient Care Team: Christain Sacramento, MD as PCP - General (Family Medicine) Buford Dresser, MD as PCP - Cardiology (Cardiology)  Extended Emergency Contact Information Primary Emergency Contact: Callicott,Ann Address: [address], [place] Phone: [phone] Mobile Phone: [phone] Relation: Other Secondary Emergency Contact: [place] Mobile Phone: [phone] Relation: Other  Code Status:  Full Code  Goals of care: Advanced Directive information Advanced Directives 05/14/2020  Does Patient Have a Medical Advance Directive? No  Type of Advance Directive -  Does patient want to make changes to medical advance directive? No - Patient declined  Copy of [place] in Chart? -  Would patient like information on creating a medical advance directive? -  Pre-existing out of facility DNR order (yellow form or pink MOST form) -     Chief Complaint  Patient presents with  . Medical Management of Chronic Issues    Routine Visit of Medical Management     HPI:  Pt is a 87 y.o. female seen today for medical management of chronic diseases. She is a short-term care resident of [facility]. She has a PMH of B12 deficiency, atrial fibrillation, breast cancer, GERD, tobacco abuse, colon polyps, depression, dyslipidemia, essential hypertension, hypothyroidism, OSA on CPAP, osteopenia, diabetes with renal insufficiency and history of memory loss. SBPs ranging from 131 to 188. She was just started on amlodipine 5 mg daily 3 days ago for better control of BPs.  She, also, takes losartan 50 mg twice a day and metoprolol tartrate 12.5 mg twice a day for hypertension. CBGs ranging from 104 to 226.  She takes Metformin ER 500 mg daily and Lantus 14 units at bedtime for  diabetes mellitus. She has brace to LUE and is nonweightbearing.   Past Medical History:  Diagnosis Date  . Anemia   . Anemia of decreased vitamin B12 absorption 05/26/2005  . Arthritis   . Atrial fibrillation ([place])   . Breast cancer ([place]) 2001  . Cataract   . GERD (gastroesophageal reflux disease)   . Hiatal hernia   . History of tobacco abuse   . Hx of colonic polyps   . Hyperlipidemia   . Hypertension   . Hypothyroidism   . Memory loss   . Obesity   . OSA on CPAP 09/2007   AHI 10.6/hr overall, 43.64/hr during REM, lost weight, does not use cpap  . Osteopenia   . Renal insufficiency   . Type 2 diabetes mellitus ([place])    Past Surgical History:  Procedure Laterality Date  . BACK SURGERY  05/30/2009  . BIOPSY  01/01/2019   Procedure: BIOPSY;  Surgeon: Milus Banister, MD;  Location: [facility] ENDOSCOPY;  Service: Endoscopy;;  . BOWEL RESECTION  1974  . CATARACT EXTRACTION Bilateral    bilateral  . CHOLECYSTECTOMY  1974  . COLONOSCOPY    . ESOPHAGOGASTRODUODENOSCOPY N/A 01/01/2019   Procedure: ESOPHAGOGASTRODUODENOSCOPY (EGD);  Surgeon: Milus Banister, MD;  Location: [facility] ENDOSCOPY;  Service: Endoscopy;  Laterality: N/A;  . EXTERNAL FIXATION REMOVAL Left 04/27/2020   Procedure: REMOVAL EXTERNAL FIXATION ARM;  Surgeon: Shona Needles, MD;  Location: [place];  Service: Orthopedics;  Laterality: Left;  Marland Kitchen MASTECTOMY Bilateral 2001   bilateral sentinel lymph nodes bio  . NM MYOCAR PERF WALL MOTION  06/2007  dipyridamole; perfusion defect in inferior myocardium consistent with diaphragmatic attenuation, remaining myocardium with no ischemia/infarct, EF 73%; normal, low risk scan   . ORIF ELBOW FRACTURE Left 03/21/2020   Procedure: OPEN REDUCTION INTERNAL FIXATION (ORIF) ELBOW/OLECRANON FRACTURE;  Surgeon: Shona Needles, MD;  Location: [place];  Service: Orthopedics;  Laterality: Left;  . TOTAL ABDOMINAL HYSTERECTOMY  1975  . TRANSTHORACIC ECHOCARDIOGRAM  02/2010   EF=>55%, stage 1  diastolic dysfunction; borderline RV enlargement; LA mild-mod dilated; mild mitral annular calcif & mild MR; mild TR with normal RSVP, AV moderately sclerotics    Allergies  Allergen Reactions  . Codeine Sulfate Nausea Only  . Other     Sodium polyoxyethylene tridecyl sulfate- PER MAR    Outpatient Encounter Medications as of 05/14/2020  Medication Sig  . acetaminophen (TYLENOL) 325 MG tablet Take 650 mg by mouth every 6 (six) hours as needed (pain).  Marland Kitchen amLODipine (NORVASC) 5 MG tablet Take 5 mg by mouth every morning.  Marland Kitchen apixaban (ELIQUIS) 2.5 MG TABS tablet Take 1 tablet (2.5 mg total) by mouth 2 (two) times daily.  . bisacodyl (DULCOLAX) 10 MG suppository Place 10 mg rectally daily as needed for moderate constipation (if constipation not relieved by MOM).  . brimonidine-timolol (COMBIGAN) 0.2-0.5 % ophthalmic solution Place 1 drop into both eyes every 12 (twelve) hours.  . ferrous sulfate 325 (65 FE) MG tablet Take 1 tablet (325 mg total) by mouth 2 (two) times daily with a meal.  . FLUoxetine (PROZAC) 20 MG capsule Take 1 capsule (20 mg total) by mouth daily.  Marland Kitchen HYDROcodone-acetaminophen (NORCO/VICODIN) 5-325 MG tablet Take 1 tablet by mouth every 6 (six) hours as needed for severe pain.  Marland Kitchen insulin glargine (LANTUS) 100 UNIT/ML Solostar Pen Inject 14 Units into the skin at bedtime.  Marland Kitchen levothyroxine (SYNTHROID) 50 MCG tablet Take 1 tablet (50 mcg total) by mouth daily.  Marland Kitchen losartan (COZAAR) 50 MG tablet Take 1 tablet (50 mg total) by mouth 2 (two) times daily.  . magnesium hydroxide (MILK OF MAGNESIA) 400 MG/5ML suspension Take 30 mLs by mouth daily as needed for mild constipation (if no bm in 3 days).  . metFORMIN (GLUCOPHAGE-XR) 500 MG 24 hr tablet Take 1 tablet (500 mg total) by mouth daily.  . metoprolol tartrate (LOPRESSOR) 25 MG tablet Take 0.5 tablets (12.5 mg total) by mouth 2 (two) times daily.  . Multiple Vitamins-Minerals (HEALTHY EYES SUPERVISION 2 PO) Take 2 capsules by mouth  daily.  . ondansetron (ZOFRAN) 4 MG tablet Take 1 tablet (4 mg total) by mouth every 6 (six) hours as needed for nausea or vomiting.  . pantoprazole (PROTONIX) 40 MG tablet Take 1 tablet (40 mg total) by mouth 2 (two) times daily before a meal.  . polyethylene glycol (MIRALAX / GLYCOLAX) 17 g packet Take 17 g by mouth daily as needed for mild constipation.  . rosuvastatin (CRESTOR) 5 MG tablet Take 1 tablet (5 mg total) by mouth daily.  . Insulin Pen Needle 32G X 4 MM MISC 1 each by Does not apply route as needed. (Patient not taking: No sig reported)   No facility-administered encounter medications on file as of 05/14/2020.    Review of Systems  GENERAL: No change in appetite, no fatigue, no fever, chills or weakness MOUTH and THROAT: Denies oral discomfort, gingival pain or bleeding RESPIRATORY: no cough, SOB, DOE, wheezing, hemoptysis CARDIAC: No chest pain, edema or palpitations GI: No abdominal pain, diarrhea, constipation, heart burn, nausea or vomiting GU: Denies dysuria,  frequency, hematuria or discharge NEUROLOGICAL: Denies dizziness, syncope, numbness, or headache PSYCHIATRIC: Denies feelings of depression or anxiety. No report of hallucinations, insomnia, paranoia, or agitation   Immunization History  Administered Date(s) Administered  . Influenza, High Dose Seasonal PF 01/30/2016, 12/27/2019  . Influenza-Unspecified 01/11/2019  . PFIZER(Purple Top)SARS-COV-2 Vaccination 07/07/2019, 07/28/2019  . Pneumococcal Conjugate-13 07/25/2013  . Pneumococcal Polysaccharide-23 08/06/2017  . Tdap 10/07/2017  . Zoster 08/08/2013  . Zoster Recombinat (Shingrix) 10/07/2017   Pertinent  Health Maintenance Due  Topic Date Due  . COLONOSCOPY (Pts 45-70yrs Insurance coverage will need to be confirmed)  07/30/2014  . OPHTHALMOLOGY EXAM  05/09/2018  . HEMOGLOBIN A1C  09/18/2020  . FOOT EXAM  12/26/2020  . INFLUENZA VACCINE  Completed  . DEXA SCAN  Completed  . PNA vac Low Risk Adult   Completed   No flowsheet data found.   Vitals:   05/14/20 1609  BP: (!) 153/76  Pulse: 78  Resp: 18  Temp: (!) 97.2 F (36.2 C)  Weight: 191 lb (86.6 kg)  Height: 5\' 6"  (1.676 m)   Body mass index is 30.83 kg/m.  Physical Exam  GENERAL APPEARANCE: Well nourished. In no acute distress. Obese SKIN:  Skin is warm and dry.  MOUTH and THROAT: Lips are without lesions. Oral mucosa is moist and without lesions. Tongue is normal in shape, size, and color and without lesions RESPIRATORY: Breathing is even & unlabored, BS CTAB CARDIAC: RRR, no murmur,no extra heart sounds, no edema GI: Abdomen soft, normal BS, no masses, no tenderness, no hepatomegaly, no splenomegaly MUSCULOSKELETAL: No deformities. Movement at each extremity is full and painless. Strength is 5/5 at each extremity. Back is without kyphosis or scoliosis CIRCULATION: Pedal pulses are 2+. There is no edema of the legs, ankles and feet NEUROLOGICAL: There is no tremor. Speech is clear. Alert to self, disoriented to time and place. PSYCHIATRIC:  Affect and behavior are appropriate  Labs reviewed: Recent Labs    03/27/20 0528 03/28/20 0743 03/31/20 0000 04/02/20 0000 04/27/20 0640  NA 134* 133* 142 136* 136  K 4.7 4.8 4.5 4.1 4.4  CL 99 101 103 103 101  CO2 25 23 25* 24* 23  GLUCOSE 166* 166*  --   --  173*  BUN 36* 33* 29* 23* 29*  CREATININE 1.63* 1.68* 1.2* 1.1 1.65*  CALCIUM 8.7* 8.7* 8.1* 7.7* 8.8*   Recent Labs    02/07/20 1434  AST 21  ALT 13  ALKPHOS 69  BILITOT 1.2  PROT 6.4*  ALBUMIN 3.3*   Recent Labs    02/07/20 1434 03/21/20 0739 03/23/20 0429 03/27/20 0856 03/31/20 0000 04/02/20 0000 04/27/20 0640  WBC 10.3   < > 6.9 5.2 6.3 5.3 7.2  NEUTROABS 8.5*  --   --   --  4.50 3.70  --   HGB 14.2   < > 11.0* 11.7* 11.1* 10.4* 12.8  HCT 44.8   < > 33.7* 37.2 33* 31* 41.2  MCV 93.5   < > 94.9 95.1  --   --  96.3  PLT 223   < > 195 253 240 217 207   < > = values in this interval not  displayed.   Lab Results  Component Value Date   TSH 1.80 04/02/2020   Lab Results  Component Value Date   HGBA1C 6.1 (H) 03/21/2020   Lab Results  Component Value Date   CHOL 85 04/02/2020   HDL 35 04/02/2020   LDLCALC 38 04/02/2020  TRIG 58 04/02/2020   CHOLHDL 2.7 07/05/2009    Significant Diagnostic Results in last 30 days:  DG Elbow 2 Views Left  Result Date: 04/27/2020 CLINICAL DATA:  Removal of fixation hardware. EXAM: LEFT ELBOW - 2 VIEW; DG C-ARM 1-60 MIN Radiation exposure index: 0.60 mGy. COMPARISON:  March 21, 2020. FINDINGS: Single intraoperative fluoroscopic image of the left elbow demonstrates removal of surgical hardware. IMPRESSION: Fluoroscopic guidance provided during removal of surgical hardware. Electronically Signed   By: Marijo Conception M.D.   On: 04/27/2020 11:16   DG C-Arm 1-60 Min  Result Date: 04/27/2020 CLINICAL DATA:  Removal of fixation hardware. EXAM: LEFT ELBOW - 2 VIEW; DG C-ARM 1-60 MIN Radiation exposure index: 0.60 mGy. COMPARISON:  March 21, 2020. FINDINGS: Single intraoperative fluoroscopic image of the left elbow demonstrates removal of surgical hardware. IMPRESSION: Fluoroscopic guidance provided during removal of surgical hardware. Electronically Signed   By: Marijo Conception M.D.   On: 04/27/2020 11:16    Assessment/Plan  1. Primary hypertension -    Was recently tarted on Amlodipine 5 mg daily -    Continue metoprolol titrate 12.5 mg twice a day and losartan 50 mg twice a day -     monitor BPs  2. Hypothyroidism, unspecified type Lab Results  Component Value Date   TSH 1.80 04/02/2020   -   Continue levothyroxine  3. Closed fracture dislocation of left elbow, sequela -   S/P removal of external pins on 04/27/20 -    Has brace to LUE and is nonweightbearing -    Continue PRN Norco for pain -     follow-up with orthopedics  4. Type 2 diabetes mellitus with stage 3a chronic kidney disease, with long-term current use of  insulin (HCC) Lab Results  Component Value Date   HGBA1C 6.1 (H) 03/21/2020   -   CBGs stable, continue and Metformin -    Monitor CBGs  5. Persistent atrial fibrillation (HCC) -    rate controlled, continue metoprolol tartrate for rate control and Eliquis for anticoagulation     Family/ staff Communication: Discussed plan of care with resident and charge nurse.  Labs/tests ordered: None  Goals of care:   Short-term care  Durenda Age, DNP, MSN, FNP-BC [facility] [phone] (Monday-Friday 8:00 a.m. - 5:00 p.m.) [phone] (after hours)

## 2020-05-22 ENCOUNTER — Encounter: Payer: Self-pay | Admitting: Adult Health

## 2020-05-22 ENCOUNTER — Non-Acute Institutional Stay (SKILLED_NURSING_FACILITY): Payer: Medicare PPO | Admitting: Adult Health

## 2020-05-22 DIAGNOSIS — S42402S Unspecified fracture of lower end of left humerus, sequela: Secondary | ICD-10-CM

## 2020-05-22 DIAGNOSIS — R600 Localized edema: Secondary | ICD-10-CM

## 2020-05-22 DIAGNOSIS — I4819 Other persistent atrial fibrillation: Secondary | ICD-10-CM | POA: Diagnosis not present

## 2020-05-22 NOTE — Progress Notes (Signed)
Location:  [place] Room Number: 211 A Place of Service:  SNF (31) Provider:  Durenda Age, DNP, FNP-BC  Patient Care Team: Christain Sacramento, MD as PCP - General (Family Medicine) Buford Dresser, MD as PCP - Cardiology (Cardiology)  Extended Emergency Contact Information Primary Emergency Contact: Callicott,Ann Address: [address], [place] Phone: [phone] Mobile Phone: [phone] Relation: Other Secondary Emergency Contact: [place] Mobile Phone: [phone] Relation: Other  Code Status:  Full Code  Goals of care: Advanced Directive information Advanced Directives 05/14/2020  Does Patient Have a Medical Advance Directive? No  Type of Advance Directive -  Does patient want to make changes to medical advance directive? No - Patient declined  Copy of [place] in Chart? -  Would patient like information on creating a medical advance directive? -  Pre-existing out of facility DNR order (yellow form or pink MOST form) -     Chief Complaint  Patient presents with  . Acute Visit    Edema of left arm    HPI:  Pt is a 87 y.o. female seen today for an acute visit regarding left arm edema. She is a short-term care resident of [facility]. She has a PMH of B12 deficiency, atrial fibrillation, breast cancer, GERD, tobacco abuse, colon polyps, depression, dyslipidemia, essential hypertension, hypothyroidism, OSA on CPAP, osteopenia, diabetes with renal insufficiency and history of memory loss. She was admitted to [place] on 03/28/20  Post [facility] hospitalization 03/21/20 to 03/28/20 S/P fall at home sustaining a closed dislocated fracture of left elbow for which she ORIF on 03/21/20. She was put on an external fixator to stabilize the elbow joint. Removal of external fixation of left arm was done on 04/27/20. She was then put on arm  brace and nonweightbearing to LUE.  She was seen today with 2+edema to LUE. Noted that wheelchair arm rest was not attached and left arm not elevated. She denies pain on left arm. No redness noted.   Past Medical History:  Diagnosis Date  . Anemia   . Anemia of decreased vitamin B12 absorption 05/26/2005  . Arthritis   . Atrial fibrillation ([place])   . Breast cancer ([place]) 2001  . Cataract   . GERD (gastroesophageal reflux disease)   . Hiatal hernia   . History of tobacco abuse   . Hx of colonic polyps   . Hyperlipidemia   . Hypertension   . Hypothyroidism   . Memory loss   . Obesity   . OSA on CPAP 09/2007   AHI 10.6/hr overall, 43.64/hr during REM, lost weight, does not use cpap  . Osteopenia   . Renal insufficiency   . Type 2 diabetes mellitus ([place])    Past Surgical History:  Procedure Laterality Date  . BACK SURGERY  05/30/2009  . BIOPSY  01/01/2019   Procedure: BIOPSY;  Surgeon: Milus Banister, MD;  Location: [facility] ENDOSCOPY;  Service: Endoscopy;;  . BOWEL RESECTION  1974  . CATARACT EXTRACTION Bilateral    bilateral  . CHOLECYSTECTOMY  1974  . COLONOSCOPY    . ESOPHAGOGASTRODUODENOSCOPY N/A 01/01/2019   Procedure: ESOPHAGOGASTRODUODENOSCOPY (EGD);  Surgeon: Milus Banister, MD;  Location: [facility] ENDOSCOPY;  Service: Endoscopy;  Laterality: N/A;  . EXTERNAL FIXATION REMOVAL Left 04/27/2020   Procedure: REMOVAL EXTERNAL FIXATION ARM;  Surgeon: Shona Needles, MD;  Location: [place];  Service: Orthopedics;  Laterality:  Left;  . MASTECTOMY Bilateral 2001   bilateral sentinel lymph nodes bio  . NM MYOCAR PERF WALL MOTION  06/2007   dipyridamole; perfusion defect in inferior myocardium consistent with diaphragmatic attenuation, remaining myocardium with no ischemia/infarct, EF 73%; normal, low risk scan   . ORIF ELBOW FRACTURE Left 03/21/2020   Procedure: OPEN REDUCTION INTERNAL FIXATION (ORIF) ELBOW/OLECRANON FRACTURE;  Surgeon: Shona Needles, MD;  Location: [place];  Service:  Orthopedics;  Laterality: Left;  . TOTAL ABDOMINAL HYSTERECTOMY  1975  . TRANSTHORACIC ECHOCARDIOGRAM  02/2010   OA=>41%, stage 1 diastolic dysfunction; borderline RV enlargement; LA mild-mod dilated; mild mitral annular calcif & mild MR; mild TR with normal RSVP, AV moderately sclerotics    Allergies  Allergen Reactions  . Codeine Sulfate Nausea Only  . Other     Sodium polyoxyethylene tridecyl sulfate- PER MAR    Outpatient Encounter Medications as of 05/22/2020  Medication Sig  . acetaminophen (TYLENOL) 325 MG tablet Take 650 mg by mouth every 6 (six) hours as needed (pain).  Marland Kitchen amLODipine (NORVASC) 5 MG tablet Take 5 mg by mouth every morning.  Marland Kitchen apixaban (ELIQUIS) 2.5 MG TABS tablet Take 1 tablet (2.5 mg total) by mouth 2 (two) times daily.  . bisacodyl (DULCOLAX) 10 MG suppository Place 10 mg rectally daily as needed for moderate constipation (if constipation not relieved by MOM).  . brimonidine-timolol (COMBIGAN) 0.2-0.5 % ophthalmic solution Place 1 drop into both eyes every 12 (twelve) hours.  . ferrous sulfate 325 (65 FE) MG tablet Take 1 tablet (325 mg total) by mouth 2 (two) times daily with a meal.  . FLUoxetine (PROZAC) 20 MG capsule Take 1 capsule (20 mg total) by mouth daily.  Marland Kitchen HYDROcodone-acetaminophen (NORCO/VICODIN) 5-325 MG tablet Take 1 tablet by mouth every 6 (six) hours as needed for severe pain.  Marland Kitchen insulin glargine (LANTUS) 100 UNIT/ML Solostar Pen Inject 14 Units into the skin at bedtime.  . Insulin Pen Needle 32G X 4 MM MISC 1 each by Does not apply route as needed. (Patient not taking: No sig reported)  . levothyroxine (SYNTHROID) 50 MCG tablet Take 1 tablet (50 mcg total) by mouth daily.  Marland Kitchen losartan (COZAAR) 50 MG tablet Take 1 tablet (50 mg total) by mouth 2 (two) times daily.  . magnesium hydroxide (MILK OF MAGNESIA) 400 MG/5ML suspension Take 30 mLs by mouth daily as needed for mild constipation (if no bm in 3 days).  . metFORMIN (GLUCOPHAGE-XR) 500 MG 24 hr  tablet Take 1 tablet (500 mg total) by mouth daily.  . metoprolol tartrate (LOPRESSOR) 25 MG tablet Take 0.5 tablets (12.5 mg total) by mouth 2 (two) times daily.  . Multiple Vitamins-Minerals (HEALTHY EYES SUPERVISION 2 PO) Take 2 capsules by mouth daily.  . ondansetron (ZOFRAN) 4 MG tablet Take 1 tablet (4 mg total) by mouth every 6 (six) hours as needed for nausea or vomiting.  . pantoprazole (PROTONIX) 40 MG tablet Take 1 tablet (40 mg total) by mouth 2 (two) times daily before a meal.  . polyethylene glycol (MIRALAX / GLYCOLAX) 17 g packet Take 17 g by mouth daily as needed for mild constipation.  . rosuvastatin (CRESTOR) 5 MG tablet Take 1 tablet (5 mg total) by mouth daily.   No facility-administered encounter medications on file as of 05/22/2020.    Review of Systems  GENERAL: No change in appetite, no fatigue, no weight changes, no fever, chills or weakness MOUTH and THROAT: Denies oral discomfort, gingival pain or bleeding RESPIRATORY:  no cough, SOB, DOE, wheezing, hemoptysis CARDIAC: No chest pain, edema or palpitations GI: No abdominal pain, diarrhea, constipation, heart burn, nausea or vomiting GU: Denies dysuria, frequency, hematuria or discharge NEUROLOGICAL: Denies dizziness, syncope, numbness, or headache PSYCHIATRIC: Denies feelings of depression or anxiety. No report of hallucinations, insomnia, paranoia, or agitation    Immunization History  Administered Date(s) Administered  . Influenza, High Dose Seasonal PF 01/30/2016, 12/27/2019  . Influenza-Unspecified 01/11/2019  . PFIZER(Purple Top)SARS-COV-2 Vaccination 07/07/2019, 07/28/2019  . Pneumococcal Conjugate-13 07/25/2013  . Pneumococcal Polysaccharide-23 08/06/2017  . Tdap 10/07/2017  . Zoster 08/08/2013  . Zoster Recombinat (Shingrix) 10/07/2017   Pertinent  Health Maintenance Due  Topic Date Due  . COLONOSCOPY (Pts 45-86yrs Insurance coverage will need to be confirmed)  07/30/2014  . OPHTHALMOLOGY EXAM   05/09/2018  . HEMOGLOBIN A1C  09/18/2020  . FOOT EXAM  12/26/2020  . INFLUENZA VACCINE  Completed  . DEXA SCAN  Completed  . PNA vac Low Risk Adult  Completed   No flowsheet data found.   Vitals:   05/22/20 1343  BP: 138/73  Pulse: 74  Resp: 20  Temp: (!) 97.3 F (36.3 C)  Weight: 195 lb 9.6 oz (88.7 kg)  Height: 5\' 6"  (1.676 m)   Body mass index is 31.57 kg/m.  Physical Exam  GENERAL APPEARANCE: Well nourished. In no acute distress. Obese SKIN:  Skin is warm and dry.  MOUTH and THROAT: Lips are without lesions. Oral mucosa is moist and without lesions.  RESPIRATORY: Breathing is even & unlabored, BS CTAB CARDIAC: RRR, no murmur,no extra heart sounds, LUE 2+edema GI: Abdomen soft, normal BS, no masses, no tenderness EXTREMITIES:  LUE with brace NEUROLOGICAL: There is no tremor. Speech is clear. Alert to self, disoriented to time and place. PSYCHIATRIC:  Affect and behavior are appropriate  Labs reviewed: Recent Labs    03/27/20 0528 03/28/20 0743 03/31/20 0000 04/02/20 0000 04/27/20 0640  NA 134* 133* 142 136* 136  K 4.7 4.8 4.5 4.1 4.4  CL 99 101 103 103 101  CO2 25 23 25* 24* 23  GLUCOSE 166* 166*  --   --  173*  BUN 36* 33* 29* 23* 29*  CREATININE 1.63* 1.68* 1.2* 1.1 1.65*  CALCIUM 8.7* 8.7* 8.1* 7.7* 8.8*   Recent Labs    02/07/20 1434  AST 21  ALT 13  ALKPHOS 69  BILITOT 1.2  PROT 6.4*  ALBUMIN 3.3*   Recent Labs    02/07/20 1434 03/21/20 0739 03/23/20 0429 03/27/20 0856 03/31/20 0000 04/02/20 0000 04/27/20 0640  WBC 10.3   < > 6.9 5.2 6.3 5.3 7.2  NEUTROABS 8.5*  --   --   --  4.50 3.70  --   HGB 14.2   < > 11.0* 11.7* 11.1* 10.4* 12.8  HCT 44.8   < > 33.7* 37.2 33* 31* 41.2  MCV 93.5   < > 94.9 95.1  --   --  96.3  PLT 223   < > 195 253 240 217 207   < > = values in this interval not displayed.   Lab Results  Component Value Date   TSH 1.80 04/02/2020   Lab Results  Component Value Date   HGBA1C 6.1 (H) 03/21/2020   Lab  Results  Component Value Date   CHOL 85 04/02/2020   HDL 35 04/02/2020   LDLCALC 38 04/02/2020   TRIG 58 04/02/2020   CHOLHDL 2.7 07/05/2009    Significant Diagnostic Results in last 30  days:  DG Elbow 2 Views Left  Result Date: 04/27/2020 CLINICAL DATA:  Removal of fixation hardware. EXAM: LEFT ELBOW - 2 VIEW; DG C-ARM 1-60 MIN Radiation exposure index: 0.60 mGy. COMPARISON:  March 21, 2020. FINDINGS: Single intraoperative fluoroscopic image of the left elbow demonstrates removal of surgical hardware. IMPRESSION: Fluoroscopic guidance provided during removal of surgical hardware. Electronically Signed   By: Marijo Conception M.D.   On: 04/27/2020 11:16   DG C-Arm 1-60 Min  Result Date: 04/27/2020 CLINICAL DATA:  Removal of fixation hardware. EXAM: LEFT ELBOW - 2 VIEW; DG C-ARM 1-60 MIN Radiation exposure index: 0.60 mGy. COMPARISON:  March 21, 2020. FINDINGS: Single intraoperative fluoroscopic image of the left elbow demonstrates removal of surgical hardware. IMPRESSION: Fluoroscopic guidance provided during removal of surgical hardware. Electronically Signed   By: Marijo Conception M.D.   On: 04/27/2020 11:16    Assessment/Plan  1. Edema of left upper extremity -   Will have the wheelchair arm rest attached to the wheelchair so she can elevate LUE while on wheelchair -   Instructed nursing staff to elevate LUE, aso, while in bed using pillows  2. Closed fracture dislocation of left elbow, sequela -  S/P ORIF and external fixation of left elbow on 03/21/20 -   S/P removal of external fixation of left arm on 04/27/20 -   Continue LUE brace and nonweightbearing on LUE  3.  PAF  -   Rate-controlled, continue Eliquis for anticoagulation and Metoprolol tartrate for rate-control    Family/ staff Communication:   Discussed plan of care with resident and charge nurse.  Labs/tests ordered:  None  Goals of care:   Short-term care   Durenda Age, DNP, MSN, FNP-BC [facility] [phone] (Monday-Friday 8:00 a.m. - 5:00 p.m.) [phone] (after hours)

## 2020-05-24 ENCOUNTER — Encounter: Payer: Self-pay | Admitting: Adult Health

## 2020-05-24 ENCOUNTER — Non-Acute Institutional Stay (SKILLED_NURSING_FACILITY): Payer: Medicare PPO | Admitting: Adult Health

## 2020-05-24 DIAGNOSIS — E785 Hyperlipidemia, unspecified: Secondary | ICD-10-CM

## 2020-05-24 DIAGNOSIS — I4819 Other persistent atrial fibrillation: Secondary | ICD-10-CM

## 2020-05-24 DIAGNOSIS — S42402S Unspecified fracture of lower end of left humerus, sequela: Secondary | ICD-10-CM | POA: Diagnosis not present

## 2020-05-24 DIAGNOSIS — R29818 Other symptoms and signs involving the nervous system: Secondary | ICD-10-CM

## 2020-05-24 DIAGNOSIS — S42402D Unspecified fracture of lower end of left humerus, subsequent encounter for fracture with routine healing: Secondary | ICD-10-CM

## 2020-05-24 DIAGNOSIS — Z794 Long term (current) use of insulin: Secondary | ICD-10-CM

## 2020-05-24 DIAGNOSIS — N1831 Chronic kidney disease, stage 3a: Secondary | ICD-10-CM

## 2020-05-24 DIAGNOSIS — K219 Gastro-esophageal reflux disease without esophagitis: Secondary | ICD-10-CM

## 2020-05-24 DIAGNOSIS — E1122 Type 2 diabetes mellitus with diabetic chronic kidney disease: Secondary | ICD-10-CM

## 2020-05-24 DIAGNOSIS — E039 Hypothyroidism, unspecified: Secondary | ICD-10-CM | POA: Diagnosis not present

## 2020-05-24 DIAGNOSIS — R4189 Other symptoms and signs involving cognitive functions and awareness: Secondary | ICD-10-CM

## 2020-05-24 DIAGNOSIS — F339 Major depressive disorder, recurrent, unspecified: Secondary | ICD-10-CM

## 2020-05-24 DIAGNOSIS — S53105S Unspecified dislocation of left ulnohumeral joint, sequela: Secondary | ICD-10-CM

## 2020-05-24 DIAGNOSIS — E1169 Type 2 diabetes mellitus with other specified complication: Secondary | ICD-10-CM

## 2020-05-24 DIAGNOSIS — I10 Essential (primary) hypertension: Secondary | ICD-10-CM | POA: Diagnosis not present

## 2020-05-24 NOTE — Progress Notes (Signed)
Location:  [facility] Room Number: 211-A Place of Service:  SNF (31) Provider:  Durenda Age, DNP, FNP-BC  Patient Care Team: Christain Sacramento, MD as PCP - General (Family Medicine) Buford Dresser, MD as PCP - Cardiology (Cardiology)  Extended Emergency Contact Information Primary Emergency Contact: Callicott,Ann Address: [address], [place] Phone: [phone] Mobile Phone: [phone] Relation: Other Secondary Emergency Contact: [place] Mobile Phone: [phone] Relation: Other  Code Status:  Full Code  Goals of care: Advanced Directive information Advanced Directives 05/24/2020  Does Patient Have a Medical Advance Directive? No  Type of Advance Directive -  Does patient want to make changes to medical advance directive? No - Patient declined  Copy of [place] in Chart? -  Would patient like information on creating a medical advance directive? -  Pre-existing out of facility DNR order (yellow form or pink MOST form) -     Chief Complaint  Patient presents with  . Discharge Note    For discharge to [facility] on 05/26/20     HPI:  Pt is a 87 y.o. female who is for discharge to [facility] on 05/26/20 with PT and OT.  She was admitted to [facility] on 03/28/2020 post [facility] hospitalization 03/21/20 to 03/28/2020 S/P fall at home sustaining a closed dislocated fracture of left elbow for which she had ORIF on 03/21/2020.  She was put on an external fixator to stabilize the elbow joint.  Removal of external fixator is still the left arm was done on 04/27/2020.  She was then put on arm brace and nonweightbearing to LUE.  Patient was admitted to this facility for short-term rehabilitation after the patient's recent hospitalization.  Patient has completed SNF rehabilitation and therapy has cleared the patient for discharge.    Past Medical History:   Diagnosis Date  . Anemia   . Anemia of decreased vitamin B12 absorption 05/26/2005  . Arthritis   . Atrial fibrillation ([place])   . Breast cancer ([place]) 2001  . Cataract   . GERD (gastroesophageal reflux disease)   . Hiatal hernia   . History of tobacco abuse   . Hx of colonic polyps   . Hyperlipidemia   . Hypertension   . Hypothyroidism   . Memory loss   . Obesity   . OSA on CPAP 09/2007   AHI 10.6/hr overall, 43.64/hr during REM, lost weight, does not use cpap  . Osteopenia   . Renal insufficiency   . Type 2 diabetes mellitus ([place])    Past Surgical History:  Procedure Laterality Date  . BACK SURGERY  05/30/2009  . BIOPSY  01/01/2019   Procedure: BIOPSY;  Surgeon: Milus Banister, MD;  Location: [facility] ENDOSCOPY;  Service: Endoscopy;;  . BOWEL RESECTION  1974  . CATARACT EXTRACTION Bilateral    bilateral  . CHOLECYSTECTOMY  1974  . COLONOSCOPY    . ESOPHAGOGASTRODUODENOSCOPY N/A 01/01/2019   Procedure: ESOPHAGOGASTRODUODENOSCOPY (EGD);  Surgeon: Milus Banister, MD;  Location: [facility] ENDOSCOPY;  Service: Endoscopy;  Laterality: N/A;  . EXTERNAL FIXATION REMOVAL Left 04/27/2020   Procedure: REMOVAL EXTERNAL FIXATION ARM;  Surgeon: Shona Needles, MD;  Location: [facility];  Service: Orthopedics;  Laterality: Left;  Marland Kitchen MASTECTOMY Bilateral 2001   bilateral sentinel lymph nodes bio  . NM MYOCAR PERF WALL MOTION  06/2007   dipyridamole; perfusion defect in inferior myocardium consistent with diaphragmatic attenuation, remaining myocardium  with no ischemia/infarct, EF 73%; normal, low risk scan   . ORIF ELBOW FRACTURE Left 03/21/2020   Procedure: OPEN REDUCTION INTERNAL FIXATION (ORIF) ELBOW/OLECRANON FRACTURE;  Surgeon: Shona Needles, MD;  Location: [facility];  Service: Orthopedics;  Laterality: Left;  . TOTAL ABDOMINAL HYSTERECTOMY  1975  . TRANSTHORACIC ECHOCARDIOGRAM  02/2010   VF=>64%, stage 1 diastolic dysfunction; borderline RV enlargement; LA mild-mod dilated; mild mitral annular calcif &  mild MR; mild TR with normal RSVP, AV moderately sclerotics    Allergies  Allergen Reactions  . Codeine Sulfate Nausea Only  . Other     Sodium polyoxyethylene tridecyl sulfate- PER MAR    Outpatient Encounter Medications as of 05/24/2020  Medication Sig  . acetaminophen (TYLENOL) 325 MG tablet Take 650 mg by mouth every 6 (six) hours as needed (pain).  Marland Kitchen amLODipine (NORVASC) 10 MG tablet Take 1 tablet (10 mg total) by mouth daily.  . bisacodyl (DULCOLAX) 10 MG suppository Place 10 mg rectally daily as needed for moderate constipation (if constipation not relieved by MOM).  . ferrous sulfate 325 (65 FE) MG tablet Take 1 tablet (325 mg total) by mouth 2 (two) times daily with a meal.  . HYDROcodone-acetaminophen (NORCO/VICODIN) 5-325 MG tablet Take 1 tablet by mouth every 6 (six) hours as needed for severe pain.  . magnesium hydroxide (MILK OF MAGNESIA) 400 MG/5ML suspension Take 30 mLs by mouth daily as needed for mild constipation (if no bm in 3 days).  . Multiple Vitamins-Minerals (HEALTHY EYES SUPERVISION 2 PO) Take 2 capsules by mouth daily.  . ondansetron (ZOFRAN) 4 MG tablet Take 1 tablet (4 mg total) by mouth every 6 (six) hours as needed for nausea or vomiting.  . polyethylene glycol (MIRALAX / GLYCOLAX) 17 g packet Take 17 g by mouth daily as needed for mild constipation.  . Sodium Phosphates (RA SALINE ENEMA RE) Place rectally as needed.  Marland Kitchen apixaban (ELIQUIS) 2.5 MG TABS tablet Take 1 tablet (2.5 mg total) by mouth 2 (two) times daily.  . brimonidine-timolol (COMBIGAN) 0.2-0.5 % ophthalmic solution Place 1 drop into both eyes every 12 (twelve) hours.  Marland Kitchen FLUoxetine (PROZAC) 20 MG capsule Take 1 capsule (20 mg total) by mouth daily.  . insulin glargine (LANTUS) 100 UNIT/ML Solostar Pen Inject 14 Units into the skin at bedtime.  Marland Kitchen levothyroxine (SYNTHROID) 50 MCG tablet Take 1 tablet (50 mcg total) by mouth daily.  Marland Kitchen losartan (COZAAR) 50 MG tablet Take 1 tablet (50 mg total) by mouth  2 (two) times daily.  . metFORMIN (GLUCOPHAGE-XR) 500 MG 24 hr tablet Take 1 tablet (500 mg total) by mouth daily.  . metoprolol tartrate (LOPRESSOR) 25 MG tablet Take 0.5 tablets (12.5 mg total) by mouth 2 (two) times daily.  . pantoprazole (PROTONIX) 40 MG tablet Take 1 tablet (40 mg total) by mouth 2 (two) times daily before a meal.  . rosuvastatin (CRESTOR) 5 MG tablet Take 1 tablet (5 mg total) by mouth daily.  . [DISCONTINUED] amLODipine (NORVASC) 5 MG tablet Take 5 mg by mouth every morning.  . [DISCONTINUED] amLODipine (NORVASC) 5 MG tablet Take 1 tablet (5 mg total) by mouth every morning.  . [DISCONTINUED] apixaban (ELIQUIS) 2.5 MG TABS tablet Take 1 tablet (2.5 mg total) by mouth 2 (two) times daily.  . [DISCONTINUED] brimonidine-timolol (COMBIGAN) 0.2-0.5 % ophthalmic solution Place 1 drop into both eyes every 12 (twelve) hours.  . [DISCONTINUED] FLUoxetine (PROZAC) 20 MG capsule Take 1 capsule (20 mg total) by mouth daily.  . [  DISCONTINUED] insulin glargine (LANTUS) 100 UNIT/ML Solostar Pen Inject 14 Units into the skin at bedtime.  . [DISCONTINUED] Insulin Pen Needle 32G X 4 MM MISC 1 each by Does not apply route as needed. (Patient not taking: No sig reported)  . [DISCONTINUED] levothyroxine (SYNTHROID) 50 MCG tablet Take 1 tablet (50 mcg total) by mouth daily.  . [DISCONTINUED] losartan (COZAAR) 50 MG tablet Take 1 tablet (50 mg total) by mouth 2 (two) times daily.  . [DISCONTINUED] metFORMIN (GLUCOPHAGE-XR) 500 MG 24 hr tablet Take 1 tablet (500 mg total) by mouth daily.  . [DISCONTINUED] metoprolol tartrate (LOPRESSOR) 25 MG tablet Take 0.5 tablets (12.5 mg total) by mouth 2 (two) times daily.  . [DISCONTINUED] pantoprazole (PROTONIX) 40 MG tablet Take 1 tablet (40 mg total) by mouth 2 (two) times daily before a meal.  . [DISCONTINUED] rosuvastatin (CRESTOR) 5 MG tablet Take 1 tablet (5 mg total) by mouth daily.   No facility-administered encounter medications on file as of  05/24/2020.    Review of Systems  GENERAL: No change in appetite, no fatigue, no weight changes, no fever, chills or weakness MOUTH and THROAT: Denies oral discomfort, gingival pain or bleeding RESPIRATORY: no cough, SOB, DOE, wheezing, hemoptysis CARDIAC: No chest pain or palpitations GI: No abdominal pain, diarrhea, constipation, heart burn, nausea or vomiting GU: Denies dysuria, frequency, hematuria, incontinence, or discharge NEUROLOGICAL: Denies dizziness, syncope, numbness, or headache PSYCHIATRIC: Denies feelings of depression or anxiety. No report of hallucinations, insomnia, paranoia, or agitation   Immunization History  Administered Date(s) Administered  . Influenza, High Dose Seasonal PF 01/30/2016, 12/27/2019  . Influenza-Unspecified 01/11/2019  . PFIZER(Purple Top)SARS-COV-2 Vaccination 07/07/2019, 07/28/2019  . Pneumococcal Conjugate-13 07/25/2013  . Pneumococcal Polysaccharide-23 08/06/2017  . Tdap 10/07/2017  . Zoster 08/08/2013  . Zoster Recombinat (Shingrix) 10/07/2017   Pertinent  Health Maintenance Due  Topic Date Due  . COLONOSCOPY (Pts 45-52yrs Insurance coverage will need to be confirmed)  07/30/2014  . OPHTHALMOLOGY EXAM  05/09/2018  . HEMOGLOBIN A1C  09/18/2020  . FOOT EXAM  12/26/2020  . INFLUENZA VACCINE  Completed  . DEXA SCAN  Completed  . PNA vac Low Risk Adult  Completed   No flowsheet data found.   Vitals:   05/24/20 1447  BP: 136/71  Pulse: 74  Resp: (!) 22  Temp: (!) 96.3 F (35.7 C)  Weight: 195 lb 9.6 oz (88.7 kg)  Height: 5\' 6"  (1.676 m)   Body mass index is 31.57 kg/m.  Physical Exam  GENERAL APPEARANCE: Well nourished. In no acute distress. Obese SKIN:  Skin is warm and dry.  MOUTH and THROAT: Lips are without lesions. Oral mucosa is moist and without lesions. Tongue is normal in shape, size, and color and without lesions RESPIRATORY: Breathing is even & unlabored, BS CTAB CARDIAC: RRR, no murmur,no extra heart sounds,  LUE 2+ edema GI: Abdomen soft, normal BS, no masses, no tenderness EXTREMITIES:  LUE  with brace NEUROLOGICAL: There is no tremor. Speech is clear. Alert to self, disoriented to time and place. PSYCHIATRIC:  Affect and behavior are appropriate  Labs reviewed: Recent Labs    03/27/20 0528 03/28/20 0743 03/31/20 0000 04/02/20 0000 04/27/20 0640  NA 134* 133* 142 136* 136  K 4.7 4.8 4.5 4.1 4.4  CL 99 101 103 103 101  CO2 25 23 25* 24* 23  GLUCOSE 166* 166*  --   --  173*  BUN 36* 33* 29* 23* 29*  CREATININE 1.63* 1.68* 1.2* 1.1 1.65*  CALCIUM 8.7* 8.7* 8.1* 7.7* 8.8*   Recent Labs    02/07/20 1434  AST 21  ALT 13  ALKPHOS 69  BILITOT 1.2  PROT 6.4*  ALBUMIN 3.3*   Recent Labs    02/07/20 1434 03/21/20 0739 03/23/20 0429 03/27/20 0856 03/31/20 0000 04/02/20 0000 04/27/20 0640  WBC 10.3   < > 6.9 5.2 6.3 5.3 7.2  NEUTROABS 8.5*  --   --   --  4.50 3.70  --   HGB 14.2   < > 11.0* 11.7* 11.1* 10.4* 12.8  HCT 44.8   < > 33.7* 37.2 33* 31* 41.2  MCV 93.5   < > 94.9 95.1  --   --  96.3  PLT 223   < > 195 253 240 217 207   < > = values in this interval not displayed.   Lab Results  Component Value Date   TSH 1.80 04/02/2020   Lab Results  Component Value Date   HGBA1C 6.1 (H) 03/21/2020   Lab Results  Component Value Date   CHOL 85 04/02/2020   HDL 35 04/02/2020   LDLCALC 38 04/02/2020   TRIG 58 04/02/2020   CHOLHDL 2.7 07/05/2009    Significant Diagnostic Results in last 30 days:  DG Elbow 2 Views Left  Result Date: 04/27/2020 CLINICAL DATA:  Removal of fixation hardware. EXAM: LEFT ELBOW - 2 VIEW; DG C-ARM 1-60 MIN Radiation exposure index: 0.60 mGy. COMPARISON:  March 21, 2020. FINDINGS: Single intraoperative fluoroscopic image of the left elbow demonstrates removal of surgical hardware. IMPRESSION: Fluoroscopic guidance provided during removal of surgical hardware. Electronically Signed   By: Marijo Conception M.D.   On: 04/27/2020 11:16   DG C-Arm  1-60 Min  Result Date: 04/27/2020 CLINICAL DATA:  Removal of fixation hardware. EXAM: LEFT ELBOW - 2 VIEW; DG C-ARM 1-60 MIN Radiation exposure index: 0.60 mGy. COMPARISON:  March 21, 2020. FINDINGS: Single intraoperative fluoroscopic image of the left elbow demonstrates removal of surgical hardware. IMPRESSION: Fluoroscopic guidance provided during removal of surgical hardware. Electronically Signed   By: Marijo Conception M.D.   On: 04/27/2020 11:16    Assessment/Plan  1. Closed fracture dislocation of left elbow, sequela -   S/P ORIF and external fixation of left elbow on 03/21/2020 -   S/P removal external fixation of left arm on 04/27/2020 -   Has LUE brace and nonweightbearing on LUE -   Follow-up with orthopedics  2. Primary hypertension - amLODipine (NORVASC) 10 MG tablet; Take 1 tablet (10 mg total) by mouth every morning.  Dispense: 30 tablet; Refill: 0 - losartan (COZAAR) 50 MG tablet; Take 1 tablet (50 mg total) by mouth 2 (two) times daily.  Dispense: 60 tablet; Refill: 0  3. Hypothyroidism, unspecified type - levothyroxine (SYNTHROID) 50 MCG tablet; Take 1 tablet (50 mcg total) by mouth daily.  Dispense: 30 tablet; Refill: 0  4. Persistent atrial fibrillation (HCC) - apixaban (ELIQUIS) 2.5 MG TABS tablet; Take 1 tablet (2.5 mg total) by mouth 2 (two) times daily.  Dispense: 60 tablet; Refill: 0 - metoprolol tartrate (LOPRESSOR) 25 MG tablet; Take 0.5 tablets (12.5 mg total) by mouth 2 (two) times daily.  Dispense: 30 tablet; Refill: 0  5. Type 2 diabetes mellitus with stage 3a chronic kidney disease, with long-term current use of insulin (HCC) Lab Results  Component Value Date   HGBA1C 6.1 (H) 03/21/2020   - insulin glargine (LANTUS) 100 UNIT/ML Solostar Pen; Inject 14 Units into the skin  at bedtime.  Dispense: 5 mL; Refill: 0 - metFORMIN (GLUCOPHAGE-XR) 500 MG 24 hr tablet; Take 1 tablet (500 mg total) by mouth daily.  Dispense: 30 tablet; Refill: 0  6. Hyperlipidemia  associated with type 2 diabetes mellitus (HCC) - rosuvastatin (CRESTOR) 5 MG tablet; Take 1 tablet (5 mg total) by mouth daily.  Dispense: 30 tablet; Refill: 0  7. Depression, recurrent (HCC) - FLUoxetine (PROZAC) 20 MG capsule; Take 1 capsule (20 mg total) by mouth daily.  Dispense: 30 capsule; Refill: 0  8. Gastroesophageal reflux disease without esophagitis - pantoprazole (PROTONIX) 40 MG tablet; Take 1 tablet (40 mg total) by mouth 2 (two) times daily before a meal.  Dispense: 60 tablet; Refill: 0  9. Neurocognitive deficits -  BIMS score 9/15, ranging in moderate cognitive impairment -  Continue supportive care     I have filled out patient's discharge paperwork and written prescriptions.  Patient will have PT and OT.  DME provided:  None  Total discharge time: Greater than 30 minutes Greater than 50% was spent in counseling and coordination of care.   Discharge time involved coordination of the discharge process with social worker, nursing staff and therapy department.    Durenda Age, DNP, MSN, FNP-BC [facility] [phone] (Monday-Friday 8:00 a.m. - 5:00 p.m.) [phone] (after hours)

## 2020-05-26 MED ORDER — PANTOPRAZOLE SODIUM 40 MG PO TBEC
40.0000 mg | DELAYED_RELEASE_TABLET | Freq: Two times a day (BID) | ORAL | 0 refills | Status: AC
Start: 2020-05-26 — End: ?

## 2020-05-26 MED ORDER — LEVOTHYROXINE SODIUM 50 MCG PO TABS: 50.0000 ug | ORAL_TABLET | Freq: Every day | ORAL | 0 refills | Status: DC

## 2020-05-26 MED ORDER — AMLODIPINE BESYLATE 10 MG PO TABS: 10.0000 mg | ORAL_TABLET | Freq: Every day | ORAL | 0 refills | Status: DC

## 2020-05-26 MED ORDER — AMLODIPINE BESYLATE 5 MG PO TABS: 5.0000 mg | ORAL_TABLET | Freq: Every morning | ORAL | 0 refills | Status: DC

## 2020-05-26 MED ORDER — FLUOXETINE HCL 20 MG PO CAPS: 20.0000 mg | ORAL_CAPSULE | Freq: Every day | ORAL | 0 refills | Status: DC

## 2020-05-26 MED ORDER — LOSARTAN POTASSIUM 50 MG PO TABS: 50.0000 mg | ORAL_TABLET | Freq: Two times a day (BID) | ORAL | 0 refills | Status: AC

## 2020-05-26 MED ORDER — METOPROLOL TARTRATE 25 MG PO TABS
12.5000 mg | ORAL_TABLET | Freq: Two times a day (BID) | ORAL | 0 refills | Status: DC
Start: 2020-05-26 — End: 2021-10-24

## 2020-05-26 MED ORDER — INSULIN GLARGINE 100 UNIT/ML SOLOSTAR PEN: 14.0000 [IU] | PEN_INJECTOR | Freq: Every day | SUBCUTANEOUS | 0 refills | Status: DC

## 2020-05-26 MED ORDER — ROSUVASTATIN CALCIUM 5 MG PO TABS
5.0000 mg | ORAL_TABLET | Freq: Every day | ORAL | 0 refills | Status: DC
Start: 2020-05-26 — End: 2020-07-04

## 2020-05-26 MED ORDER — APIXABAN 2.5 MG PO TABS: 2.5000 mg | ORAL_TABLET | Freq: Two times a day (BID) | ORAL | 0 refills | Status: DC

## 2020-05-26 MED ORDER — BRIMONIDINE TARTRATE-TIMOLOL 0.2-0.5 % OP SOLN: 1.0000 [drp] | Freq: Two times a day (BID) | OPHTHALMIC | 0 refills | Status: AC

## 2020-05-26 MED ORDER — METFORMIN HCL ER 500 MG PO TB24: 500.0000 mg | ORAL_TABLET | Freq: Every day | ORAL | 0 refills | Status: DC

## 2020-05-27 ENCOUNTER — Other Ambulatory Visit: Payer: Self-pay | Admitting: Family

## 2020-05-27 DIAGNOSIS — I10 Essential (primary) hypertension: Secondary | ICD-10-CM

## 2020-05-27 MED ORDER — AMLODIPINE BESYLATE 10 MG PO TABS: 10.0000 mg | ORAL_TABLET | Freq: Every day | ORAL | 0 refills | Status: DC

## 2020-05-27 NOTE — Progress Notes (Signed)
Pharmacy called to verify Amlodipine dose states family not sure whether patient is supposed to take 5 mg tablet or 10 mg tablet.Med list verified patient currently on 10 mg tablet.Script send to [facility]

## 2020-06-01 ENCOUNTER — Other Ambulatory Visit: Payer: Self-pay | Admitting: Adult Health

## 2020-06-01 DIAGNOSIS — I4819 Other persistent atrial fibrillation: Secondary | ICD-10-CM

## 2020-06-02 DIAGNOSIS — E1142 Type 2 diabetes mellitus with diabetic polyneuropathy: Secondary | ICD-10-CM | POA: Insufficient documentation

## 2020-06-20 ENCOUNTER — Other Ambulatory Visit (HOSPITAL_COMMUNITY): Payer: Self-pay | Admitting: Otolaryngology

## 2020-06-28 ENCOUNTER — Emergency Department (HOSPITAL_COMMUNITY): Payer: Medicare PPO

## 2020-06-28 ENCOUNTER — Emergency Department (HOSPITAL_COMMUNITY)
Admission: EM | Admit: 2020-06-28 | Discharge: 2020-06-28 | Disposition: A | Discharge status: Home / Self Care | Payer: Medicare PPO | Attending: Emergency Medicine | Admitting: Emergency Medicine

## 2020-06-28 ENCOUNTER — Other Ambulatory Visit: Payer: Self-pay

## 2020-06-28 DIAGNOSIS — Z794 Long term (current) use of insulin: Secondary | ICD-10-CM | POA: Diagnosis not present

## 2020-06-28 DIAGNOSIS — Y92122 Bedroom in nursing home as the place of occurrence of the external cause: Secondary | ICD-10-CM | POA: Diagnosis not present

## 2020-06-28 DIAGNOSIS — N183 Chronic kidney disease, stage 3 unspecified: Secondary | ICD-10-CM | POA: Diagnosis not present

## 2020-06-28 DIAGNOSIS — I129 Hypertensive chronic kidney disease with stage 1 through stage 4 chronic kidney disease, or unspecified chronic kidney disease: Secondary | ICD-10-CM | POA: Insufficient documentation

## 2020-06-28 DIAGNOSIS — Z7984 Long term (current) use of oral hypoglycemic drugs: Secondary | ICD-10-CM | POA: Diagnosis not present

## 2020-06-28 DIAGNOSIS — Z853 Personal history of malignant neoplasm of breast: Secondary | ICD-10-CM | POA: Insufficient documentation

## 2020-06-28 DIAGNOSIS — R110 Nausea: Secondary | ICD-10-CM | POA: Insufficient documentation

## 2020-06-28 DIAGNOSIS — Z87891 Personal history of nicotine dependence: Secondary | ICD-10-CM | POA: Insufficient documentation

## 2020-06-28 DIAGNOSIS — S8992XA Unspecified injury of left lower leg, initial encounter: Secondary | ICD-10-CM | POA: Diagnosis present

## 2020-06-28 DIAGNOSIS — Y92009 Unspecified place in unspecified non-institutional (private) residence as the place of occurrence of the external cause: Secondary | ICD-10-CM

## 2020-06-28 DIAGNOSIS — E1122 Type 2 diabetes mellitus with diabetic chronic kidney disease: Secondary | ICD-10-CM | POA: Insufficient documentation

## 2020-06-28 DIAGNOSIS — Z79899 Other long term (current) drug therapy: Secondary | ICD-10-CM | POA: Diagnosis not present

## 2020-06-28 DIAGNOSIS — Z20822 Contact with and (suspected) exposure to covid-19: Secondary | ICD-10-CM | POA: Insufficient documentation

## 2020-06-28 DIAGNOSIS — Z7901 Long term (current) use of anticoagulants: Secondary | ICD-10-CM | POA: Diagnosis not present

## 2020-06-28 DIAGNOSIS — W19XXXA Unspecified fall, initial encounter: Secondary | ICD-10-CM | POA: Insufficient documentation

## 2020-06-28 DIAGNOSIS — E039 Hypothyroidism, unspecified: Secondary | ICD-10-CM | POA: Diagnosis not present

## 2020-06-28 DIAGNOSIS — J1089 Influenza due to other identified influenza virus with other manifestations: Secondary | ICD-10-CM | POA: Diagnosis not present

## 2020-06-28 DIAGNOSIS — J101 Influenza due to other identified influenza virus with other respiratory manifestations: Secondary | ICD-10-CM

## 2020-06-28 DIAGNOSIS — S8002XA Contusion of left knee, initial encounter: Secondary | ICD-10-CM

## 2020-06-28 LAB — CBC
HCT: 40.3 % (ref 36.0–46.0)
Hemoglobin: 12.5 g/dL (ref 12.0–15.0)
MCH: 29.1 pg (ref 26.0–34.0)
MCHC: 31 g/dL (ref 30.0–36.0)
MCV: 93.9 fL (ref 80.0–100.0)
Platelets: 189 10*3/uL (ref 150–400)
RBC: 4.29 MIL/uL (ref 3.87–5.11)
RDW: 14.9 % (ref 11.5–15.5)
WBC: 9.8 10*3/uL (ref 4.0–10.5)
nRBC: 0 % (ref 0.0–0.2)

## 2020-06-28 LAB — RESP PANEL BY RT-PCR (FLU A&B, COVID) ARPGX2
Influenza A by PCR: POSITIVE — AB
Influenza B by PCR: NEGATIVE
SARS Coronavirus 2 by RT PCR: NEGATIVE

## 2020-06-28 LAB — URINALYSIS, ROUTINE W REFLEX MICROSCOPIC
Bilirubin Urine: NEGATIVE
Glucose, UA: NEGATIVE mg/dL
Hgb urine dipstick: NEGATIVE
Ketones, ur: NEGATIVE mg/dL
Leukocytes,Ua: NEGATIVE
Nitrite: NEGATIVE
Protein, ur: 100 mg/dL — AB
Specific Gravity, Urine: 1.019 (ref 1.005–1.030)
pH: 5 (ref 5.0–8.0)

## 2020-06-28 LAB — COMPREHENSIVE METABOLIC PANEL
ALT: 13 U/L (ref 0–44)
AST: 19 U/L (ref 15–41)
Albumin: 3.1 g/dL — ABNORMAL LOW (ref 3.5–5.0)
Alkaline Phosphatase: 89 U/L (ref 38–126)
Anion gap: 11 (ref 5–15)
BUN: 24 mg/dL — ABNORMAL HIGH (ref 8–23)
CO2: 25 mmol/L (ref 22–32)
Calcium: 8.5 mg/dL — ABNORMAL LOW (ref 8.9–10.3)
Chloride: 101 mmol/L (ref 98–111)
Creatinine, Ser: 1.46 mg/dL — ABNORMAL HIGH (ref 0.44–1.00)
GFR, Estimated: 35 mL/min — ABNORMAL LOW (ref >60)
Glucose, Bld: 154 mg/dL — ABNORMAL HIGH (ref 70–99)
Potassium: 4.4 mmol/L (ref 3.5–5.1)
Sodium: 137 mmol/L (ref 135–145)
Total Bilirubin: 1 mg/dL (ref 0.3–1.2)
Total Protein: 6.2 g/dL — ABNORMAL LOW (ref 6.5–8.1)

## 2020-06-28 LAB — TYPE AND SCREEN
ABO/RH(D): A POS
Antibody Screen: NEGATIVE

## 2020-06-28 MED ORDER — ONDANSETRON 4 MG PO TBDP
8.0000 mg | ORAL_TABLET | Freq: Once | ORAL | Status: AC
  Administered 2020-06-28: 8 mg via ORAL
  Filled 2020-06-28: qty 2

## 2020-06-28 MED ORDER — ACETAMINOPHEN 500 MG PO TABS
1000.0000 mg | ORAL_TABLET | Freq: Once | ORAL | Status: AC
  Administered 2020-06-28: 1000 mg via ORAL
  Filled 2020-06-28: qty 2

## 2020-06-28 MED ORDER — OSELTAMIVIR PHOSPHATE 75 MG PO CAPS: 75.0000 mg | ORAL_CAPSULE | Freq: Two times a day (BID) | ORAL | 0 refills | Status: DC

## 2020-06-28 NOTE — ED Notes (Signed)
Diana [name]- [phone], sister-law. Call for any information.

## 2020-06-28 NOTE — ED Notes (Signed)
Caregiver verbalized understanding of d/c instructions and follow up care.

## 2020-06-28 NOTE — ED Notes (Signed)
Pt cleaned up, fresh and dry. Urine collected and pur wik placed.

## 2020-06-28 NOTE — Discharge Instructions (Signed)
Your testing is positive for Flu - Take tamiflu twice daily for 5 days ER for severe difficulty breathing - but expect to have some coughing and some shortness of breath OR you may have some diarreha and vomiting -you will need to be on precautions from others around you for the next 5 days  Your other xrays look fine, there is no other broken bones or injury to the brain  You may continue taking your home medications as prescribed

## 2020-06-28 NOTE — ED Triage Notes (Signed)
Pt arrives via [facility] from [facility] with complaints of unwitnessed fall. Pt on elqusis. Alert and oriented to self only. Bruising and swelling noted on left knee.    BP 170/100 93% 83 HR

## 2020-06-28 NOTE — ED Provider Notes (Signed)
[facility] EMERGENCY DEPARTMENT Provider Note   CSN: [id number] Arrival date & time: 06/28/20  0086     History Chief Complaint  Patient presents with  . Fall    Diana [name] is a 87 y.o. female.  HPI   Patient is an 87 year old female who has a history of atrial fibrillation, she is anticoagulated, she has a history of breast cancer status post bilateral mastectomy, she has some memory loss hypothyroidism and type 2 diabetes with some renal insufficiency.  She lives at a nursing facility, [facility], she was found on the ground beside her bed's in the sitting position, she has been nauseated, she denies knowing what happened, she does not think that she fell, she has no recollection of those events, level 5 caveat applies due to the memory loss.  Paramedics deny seeing any signs of trauma, her vital signs were unremarkable except for some hypertension, she was transported to the hospital, not given any medications prehospital, she only complains of being nauseated at this time.  She denies any pain in her head her chest or back or her neck.  She denies any pain in the arms or the legs.  Past Medical History:  Diagnosis Date  . Anemia   . Anemia of decreased vitamin B12 absorption 05/26/2005  . Arthritis   . Atrial fibrillation ([place])   . Breast cancer ([place]) 2001  . Cataract   . GERD (gastroesophageal reflux disease)   . Hiatal hernia   . History of tobacco abuse   . Hx of colonic polyps   . Hyperlipidemia   . Hypertension   . Hypothyroidism   . Memory loss   . Obesity   . OSA on CPAP 09/2007   AHI 10.6/hr overall, 43.64/hr during REM, lost weight, does not use cpap  . Osteopenia   . Renal insufficiency   . Type 2 diabetes mellitus [facility])     Patient Active Problem List   Diagnosis Date Noted  . Closed dislocation of left elbow 03/19/2020  . Depression, recurrent ([place]) 01/21/2019  . Hyperlipidemia associated with type 2 diabetes mellitus  ([place]) 01/21/2019  . Acute metabolic encephalopathy 76/19/5093  . Fall 01/11/2019  . Klebsiella UTI (urinary tract infection) 01/11/2019  . Acute urinary retention 01/11/2019  . Left kidney lesion 01/11/2019  . Ulcerative esophagitis 01/11/2019  . Persistent atrial fibrillation ([place])   . Anemia 01/01/2019  . Hiatal hernia   . Symptomatic anemia 12/31/2018  . Hypoglycemia 12/31/2018  . Ribs, multiple fractures 12/31/2018  . HTN (hypertension) 05/15/2013  . OSA on CPAP 05/15/2013  . Hypothyroidism 05/15/2013  . Secondary DM with CKD stage 3 and hypertension ([place]) 05/15/2013  . Lower extremity edema 05/15/2013  . Renal insufficiency 05/15/2013  . Morbid obesity ([place]) 05/15/2013  . Breast cancer ([place]) 03/18/2011    Past Surgical History:  Procedure Laterality Date  . BACK SURGERY  05/30/2009  . BIOPSY  01/01/2019   Procedure: BIOPSY;  Surgeon: Milus Banister, MD;  Location: [facility] ENDOSCOPY;  Service: Endoscopy;;  . BOWEL RESECTION  1974  . CATARACT EXTRACTION Bilateral    bilateral  . CHOLECYSTECTOMY  1974  . COLONOSCOPY    . ESOPHAGOGASTRODUODENOSCOPY N/A 01/01/2019   Procedure: ESOPHAGOGASTRODUODENOSCOPY (EGD);  Surgeon: Milus Banister, MD;  Location: [facility] ENDOSCOPY;  Service: Endoscopy;  Laterality: N/A;  . EXTERNAL FIXATION REMOVAL Left 04/27/2020   Procedure: REMOVAL EXTERNAL FIXATION ARM;  Surgeon: Shona Needles, MD;  Location: [place];  Service: Orthopedics;  Laterality: Left;  Marland Kitchen MASTECTOMY Bilateral 2001   bilateral sentinel lymph nodes bio  . NM MYOCAR PERF WALL MOTION  06/2007   dipyridamole; perfusion defect in inferior myocardium consistent with diaphragmatic attenuation, remaining myocardium with no ischemia/infarct, EF 73%; normal, low risk scan   . ORIF ELBOW FRACTURE Left 03/21/2020   Procedure: OPEN REDUCTION INTERNAL FIXATION (ORIF) ELBOW/OLECRANON FRACTURE;  Surgeon: Shona Needles, MD;  Location: [place];  Service: Orthopedics;  Laterality: Left;  . TOTAL ABDOMINAL  HYSTERECTOMY  1975  . TRANSTHORACIC ECHOCARDIOGRAM  02/2010   FB=>51%, stage 1 diastolic dysfunction; borderline RV enlargement; LA mild-mod dilated; mild mitral annular calcif & mild MR; mild TR with normal RSVP, AV moderately sclerotics     OB History   No obstetric history on file.     Family History  Problem Relation Age of Onset  . Heart attack Mother   . Heart attack Father   . Colon cancer Neg Hx     Social History   Tobacco Use  . Smoking status: Former Smoker    Years: 13.00    Types: Cigarettes    Quit date: 05/02/2002    Years since quitting: 18.1  . Smokeless tobacco: Never Used  Vaping Use  . Vaping Use: Never used  Substance Use Topics  . Alcohol use: No    Alcohol/week: 0.0 standard drinks  . Drug use: Never    Home Medications Prior to Admission medications   Medication Sig Start Date End Date Taking? Authorizing Provider  apixaban (ELIQUIS) 2.5 MG TABS tablet Take 1 tablet (2.5 mg total) by mouth 2 (two) times daily. 05/26/20  Yes Medina-Vargas, Monina C, NP  brimonidine-timolol (COMBIGAN) 0.2-0.5 % ophthalmic solution Place 1 drop into both eyes every 12 (twelve) hours. 05/26/20  Yes Medina-Vargas, Monina C, NP  ferrous sulfate 325 (65 FE) MG tablet Take 1 tablet (325 mg total) by mouth 2 (two) times daily with a meal. Patient taking differently: Take 325 mg by mouth daily with breakfast. 04/14/20  Yes Medina-Vargas, Monina C, NP  FLUoxetine (PROZAC) 20 MG capsule Take 1 capsule (20 mg total) by mouth daily. 05/26/20  Yes Medina-Vargas, Monina C, NP  glimepiride (AMARYL) 2 MG tablet Take 2 mg by mouth in the morning and at bedtime.   Yes [provider]  insulin glargine (LANTUS) 100 UNIT/ML Solostar Pen Inject 14 Units into the skin at bedtime. Patient taking differently: Inject 7 Units into the skin at bedtime. 05/26/20  Yes Medina-Vargas, Monina C, NP  levothyroxine (SYNTHROID) 50 MCG tablet Take 1 tablet (50 mcg total) by mouth daily. 05/26/20  Yes  Medina-Vargas, Monina C, NP  losartan (COZAAR) 50 MG tablet Take 1 tablet (50 mg total) by mouth 2 (two) times daily. 05/26/20  Yes Medina-Vargas, Monina C, NP  metFORMIN (GLUCOPHAGE) 500 MG tablet Take 500 mg by mouth daily with breakfast.   Yes [provider]  metoprolol tartrate (LOPRESSOR) 25 MG tablet Take 0.5 tablets (12.5 mg total) by mouth 2 (two) times daily. 05/26/20  Yes Medina-Vargas, Monina C, NP  Multiple Vitamins-Minerals (PRESERVISION AREDS 2 PO) Take 1 capsule by mouth daily.   Yes [provider]  oseltamivir (TAMIFLU) 75 MG capsule Take 1 capsule (75 mg total) by mouth every 12 (twelve) hours. 06/28/20  Yes Noemi Chapel, MD  pantoprazole (PROTONIX) 40 MG tablet Take 1 tablet (40 mg total) by mouth 2 (two) times daily before a meal. 05/26/20  Yes Medina-Vargas, Monina C, NP  rosuvastatin (CRESTOR) 10 MG  tablet Take 10 mg by mouth at bedtime.   Yes [provider]  amLODipine (NORVASC) 10 MG tablet Take 1 tablet (10 mg total) by mouth daily. Patient not taking: No sig reported 05/27/20   Ngetich, Dinah C, NP  HYDROcodone-acetaminophen (NORCO/VICODIN) 5-325 MG tablet Take 1 tablet by mouth every 6 (six) hours as needed for severe pain. Patient not taking: No sig reported 04/14/20   Medina-Vargas, Monina C, NP  metFORMIN (GLUCOPHAGE-XR) 500 MG 24 hr tablet Take 1 tablet (500 mg total) by mouth daily. Patient not taking: No sig reported 05/26/20   Medina-Vargas, Monina C, NP  ondansetron (ZOFRAN) 4 MG tablet Take 1 tablet (4 mg total) by mouth every 6 (six) hours as needed for nausea or vomiting. Patient not taking: No sig reported 04/14/20   Medina-Vargas, Monina C, NP  polyethylene glycol (MIRALAX / GLYCOLAX) 17 g packet Take 17 g by mouth daily as needed for mild constipation. Patient not taking: No sig reported 03/23/20   Delray Alt, PA-C  rosuvastatin (CRESTOR) 5 MG tablet Take 1 tablet (5 mg total) by mouth daily. Patient not taking: No sig reported  05/26/20   Medina-Vargas, Monina C, NP    Allergies    Codeine sulfate and Other  Review of Systems   Review of Systems  Unable to perform ROS: Dementia    Physical Exam Updated Vital Signs BP (!) 135/91   Pulse 69   Resp (!) 22   LMP  (LMP Unknown)   SpO2 95%   Physical Exam Vitals and nursing note reviewed.  Constitutional:      General: She is not in acute distress.    Appearance: She is well-developed.  HENT:     Head: Normocephalic and atraumatic.     Comments: Entire scalp and cranium palpated, no tenderness or hematomas, no signs of trauma    Mouth/Throat:     Pharynx: No oropharyngeal exudate.  Eyes:     General: No scleral icterus.       Right eye: No discharge.        Left eye: No discharge.     Conjunctiva/sclera: Conjunctivae normal.     Pupils: Pupils are equal, round, and reactive to light.  Neck:     Thyroid: No thyromegaly.     Vascular: No JVD.  Cardiovascular:     Rate and Rhythm: Normal rate and regular rhythm.     Heart sounds: Normal heart sounds. No murmur heard. No friction rub. No gallop.   Pulmonary:     Effort: Pulmonary effort is normal. No respiratory distress.     Breath sounds: Normal breath sounds. No wheezing or rales.  Abdominal:     General: Bowel sounds are normal. There is no distension.     Palpations: Abdomen is soft. There is no mass.     Tenderness: There is no abdominal tenderness.  Musculoskeletal:        General: Tenderness present. Normal range of motion.     Cervical back: Normal range of motion and neck supple.     Comments: There is some asymmetry of the upper extremities with the left upper extremity having some swelling compared to the right.  Her left lower extremity has some bruising around the knee which appears new and fresh however she is able to fully straight leg raise bilaterally and can bend both knees without difficulty.  Her mental status is awake and alert however her memory is consistent with her known  cognitive delay  Lymphadenopathy:     Cervical: No cervical adenopathy.  Skin:    General: Skin is warm and dry.     Findings: No erythema or rash.  Neurological:     Mental Status: She is alert.     Coordination: Coordination normal.  Psychiatric:        Behavior: Behavior normal.     ED Results / Procedures / Treatments   Labs (all labs ordered are listed, but only abnormal results are displayed) Labs Reviewed  RESP PANEL BY RT-PCR (FLU A&B, COVID) ARPGX2 - Abnormal; Notable for the following components:      Result Value   Influenza A by PCR POSITIVE (*)    All other components within normal limits  URINALYSIS, ROUTINE W REFLEX MICROSCOPIC - Abnormal; Notable for the following components:   APPearance HAZY (*)    Protein, ur 100 (*)    Bacteria, UA RARE (*)    All other components within normal limits  COMPREHENSIVE METABOLIC PANEL - Abnormal; Notable for the following components:   Glucose, Bld 154 (*)    BUN 24 (*)    Creatinine, Ser 1.46 (*)    Calcium 8.5 (*)    Total Protein 6.2 (*)    Albumin 3.1 (*)    GFR, Estimated 35 (*)    All other components within normal limits  URINE CULTURE  CBC  TYPE AND SCREEN    EKG EKG Interpretation  Date/Time:  Thursday June 28 2020 09:34:20 EDT Ventricular Rate:  78 PR Interval:    QRS Duration: 91 QT Interval:  382 QTC Calculation: 436 R Axis:   -26 Text Interpretation: Atrial fibrillation LVH with secondary repolarization abnormality since last tracing no significant change Confirmed by Noemi Chapel [phone]) on 06/28/2020 9:36:58 AM   Radiology CT Head Wo Contrast  Result Date: 06/28/2020 CLINICAL DATA:  Fall, facial injury.  Blood thinners. EXAM: CT HEAD WITHOUT CONTRAST TECHNIQUE: Contiguous axial images were obtained from the base of the skull through the vertex without intravenous contrast. COMPARISON:  02/07/2020 FINDINGS: Brain: Moderate atrophy. Moderate white matter changes with patchy hypodensity  bilaterally similar to prior study. Negative for acute infarct, hemorrhage, hydrocephalus. Vascular: Negative for hyperdense vessel Skull: Negative calvarium Sinuses/Orbits: Paranasal sinuses clear. Right mastoid tip effusion. Negative orbit. Other: None IMPRESSION: No acute abnormality no change from the prior study Moderate atrophy and moderate chronic microvascular ischemic change in the white matter. Electronically Signed   By: Franchot Gallo M.D.   On: 06/28/2020 11:28   DG Chest Port 1 View  Result Date: 06/28/2020 CLINICAL DATA:  Altered mental status, cough, fell, LEFT knee swelling and abrasion EXAM: PORTABLE CHEST 1 VIEW COMPARISON:  Portable exam 0938 hours compared to 12/30/2018 FINDINGS: Enlargement of cardiac silhouette with pulmonary vascular congestion. Atherosclerotic calcification aorta. Lungs clear. No acute infiltrate, pleural effusion, or pneumothorax. Bones demineralized. Calcified RIGHT thyroid nodule unchanged since 05/06/2007. IMPRESSION: Enlargement of cardiac silhouette with pulmonary vascular congestion. No acute abnormalities. Aortic Atherosclerosis (ICD10-I70.0). Electronically Signed   By: Lavonia Dana M.D.   On: 06/28/2020 10:14   DG Knee Complete 4 Views Left  Result Date: 06/28/2020 CLINICAL DATA:  LEFT knee swelling and abrasion post fall EXAM: LEFT KNEE - COMPLETE 4+ VIEW COMPARISON:  None FINDINGS: Osseous demineralization. Advanced tricompartmental osteoarthritic changes with joint space narrowing and spur formation greatest at medial compartment. Scattered chondrocalcinosis question CPPD. Lateral subluxation of tibia. No acute fracture, dislocation, or bone destruction. No joint effusion. Atherosclerotic calcifications LEFT superficial femoral artery. IMPRESSION:  Advanced tricompartmental osteoarthritic changes of the LEFT knee, greatest at medial compartment. Electronically Signed   By: Lavonia Dana M.D.   On: 06/28/2020 10:15    Procedures Procedures   Medications  Ordered in ED Medications  ondansetron (ZOFRAN-ODT) disintegrating tablet 8 mg (8 mg Oral Given 06/28/20 1032)    ED Course  I have reviewed the triage vital signs and the nursing notes.  Pertinent labs & imaging results that were available during my care of the patient were reviewed by me and considered in my medical decision making (see chart for details).    MDM Rules/Calculators/A&P                          The patient has had some kind of fall, it appears that she fell onto her knee, she does not have any pain in her arms her wrists or her hands, I do not see any signs of trauma to the upper body or the head.  The cause of the fall is not clear, her memory limits being able to understand what happened and this was an unwitnessed event.  We will obtain some labs and an EKG, she has urine soaked diaper on, this will be changed and a sample will be collected.  EKG obtained, will obtain imaging of the knee and the chest.  No other distress at this time.  Zofran given  No more nausea, CT scan of the head is negative, x-rays are negative for fracture, lab work does reveal that the patient has what appears to be influenza A, she is afebrile and has normal oxygen levels, she is stable for discharge on Tamiflu  Final Clinical Impression(s) / ED Diagnoses Final diagnoses:  Contusion of left knee, initial encounter  Influenza A  Fall in home, initial encounter    Rx / DC Orders ED Discharge Orders         Ordered    oseltamivir (TAMIFLU) 75 MG capsule  Every 12 hours        06/28/20 1214           Noemi Chapel, MD 06/28/20 1214

## 2020-06-28 NOTE — ED Notes (Signed)
Spoke to [place], pt caregiver. Diana [name] will come pick up pt.

## 2020-06-29 LAB — URINE CULTURE: Culture: NO GROWTH

## 2020-06-30 ENCOUNTER — Emergency Department (HOSPITAL_COMMUNITY): Payer: Medicare PPO

## 2020-06-30 ENCOUNTER — Encounter (HOSPITAL_COMMUNITY): Payer: Self-pay | Admitting: Emergency Medicine

## 2020-06-30 ENCOUNTER — Inpatient Hospital Stay (HOSPITAL_COMMUNITY)
Admission: EM | Admit: 2020-06-30 | Discharge: 2020-07-06 | DRG: 637 | Disposition: A | Discharge status: Home Health | Payer: Medicare PPO | Attending: Internal Medicine | Admitting: Internal Medicine

## 2020-06-30 ENCOUNTER — Other Ambulatory Visit: Payer: Self-pay

## 2020-06-30 DIAGNOSIS — Z9013 Acquired absence of bilateral breasts and nipples: Secondary | ICD-10-CM

## 2020-06-30 DIAGNOSIS — M1712 Unilateral primary osteoarthritis, left knee: Secondary | ICD-10-CM | POA: Diagnosis present

## 2020-06-30 DIAGNOSIS — K5900 Constipation, unspecified: Secondary | ICD-10-CM | POA: Diagnosis not present

## 2020-06-30 DIAGNOSIS — I129 Hypertensive chronic kidney disease with stage 1 through stage 4 chronic kidney disease, or unspecified chronic kidney disease: Secondary | ICD-10-CM | POA: Diagnosis present

## 2020-06-30 DIAGNOSIS — R296 Repeated falls: Secondary | ICD-10-CM | POA: Diagnosis present

## 2020-06-30 DIAGNOSIS — G4733 Obstructive sleep apnea (adult) (pediatric): Secondary | ICD-10-CM | POA: Diagnosis present

## 2020-06-30 DIAGNOSIS — Z6830 Body mass index (BMI) 30.0-30.9, adult: Secondary | ICD-10-CM

## 2020-06-30 DIAGNOSIS — E1122 Type 2 diabetes mellitus with diabetic chronic kidney disease: Secondary | ICD-10-CM

## 2020-06-30 DIAGNOSIS — E162 Hypoglycemia, unspecified: Secondary | ICD-10-CM | POA: Diagnosis present

## 2020-06-30 DIAGNOSIS — E039 Hypothyroidism, unspecified: Secondary | ICD-10-CM | POA: Diagnosis present

## 2020-06-30 DIAGNOSIS — N183 Chronic kidney disease, stage 3 unspecified: Secondary | ICD-10-CM

## 2020-06-30 DIAGNOSIS — R531 Weakness: Secondary | ICD-10-CM

## 2020-06-30 DIAGNOSIS — Z87891 Personal history of nicotine dependence: Secondary | ICD-10-CM

## 2020-06-30 DIAGNOSIS — E669 Obesity, unspecified: Secondary | ICD-10-CM | POA: Diagnosis present

## 2020-06-30 DIAGNOSIS — D631 Anemia in chronic kidney disease: Secondary | ICD-10-CM | POA: Diagnosis present

## 2020-06-30 DIAGNOSIS — N1832 Chronic kidney disease, stage 3b: Secondary | ICD-10-CM | POA: Diagnosis present

## 2020-06-30 DIAGNOSIS — Z79899 Other long term (current) drug therapy: Secondary | ICD-10-CM

## 2020-06-30 DIAGNOSIS — R4189 Other symptoms and signs involving cognitive functions and awareness: Secondary | ICD-10-CM | POA: Diagnosis present

## 2020-06-30 DIAGNOSIS — Z853 Personal history of malignant neoplasm of breast: Secondary | ICD-10-CM

## 2020-06-30 DIAGNOSIS — Z7984 Long term (current) use of oral hypoglycemic drugs: Secondary | ICD-10-CM

## 2020-06-30 DIAGNOSIS — I4819 Other persistent atrial fibrillation: Secondary | ICD-10-CM | POA: Diagnosis present

## 2020-06-30 DIAGNOSIS — Z9049 Acquired absence of other specified parts of digestive tract: Secondary | ICD-10-CM

## 2020-06-30 DIAGNOSIS — E785 Hyperlipidemia, unspecified: Secondary | ICD-10-CM | POA: Diagnosis present

## 2020-06-30 DIAGNOSIS — Z7901 Long term (current) use of anticoagulants: Secondary | ICD-10-CM

## 2020-06-30 DIAGNOSIS — Z794 Long term (current) use of insulin: Secondary | ICD-10-CM

## 2020-06-30 DIAGNOSIS — Z90710 Acquired absence of both cervix and uterus: Secondary | ICD-10-CM

## 2020-06-30 DIAGNOSIS — G9341 Metabolic encephalopathy: Secondary | ICD-10-CM | POA: Diagnosis not present

## 2020-06-30 DIAGNOSIS — E860 Dehydration: Secondary | ICD-10-CM | POA: Diagnosis present

## 2020-06-30 DIAGNOSIS — J101 Influenza due to other identified influenza virus with other respiratory manifestations: Secondary | ICD-10-CM

## 2020-06-30 DIAGNOSIS — Z885 Allergy status to narcotic agent status: Secondary | ICD-10-CM

## 2020-06-30 DIAGNOSIS — Z20822 Contact with and (suspected) exposure to covid-19: Secondary | ICD-10-CM | POA: Diagnosis present

## 2020-06-30 DIAGNOSIS — E11649 Type 2 diabetes mellitus with hypoglycemia without coma: Principal | ICD-10-CM | POA: Diagnosis present

## 2020-06-30 DIAGNOSIS — N1831 Chronic kidney disease, stage 3a: Secondary | ICD-10-CM

## 2020-06-30 DIAGNOSIS — I4821 Permanent atrial fibrillation: Secondary | ICD-10-CM | POA: Diagnosis present

## 2020-06-30 DIAGNOSIS — R0600 Dyspnea, unspecified: Secondary | ICD-10-CM

## 2020-06-30 DIAGNOSIS — K219 Gastro-esophageal reflux disease without esophagitis: Secondary | ICD-10-CM | POA: Diagnosis present

## 2020-06-30 DIAGNOSIS — W19XXXA Unspecified fall, initial encounter: Secondary | ICD-10-CM | POA: Diagnosis present

## 2020-06-30 DIAGNOSIS — Z79890 Hormone replacement therapy (postmenopausal): Secondary | ICD-10-CM

## 2020-06-30 DIAGNOSIS — E1169 Type 2 diabetes mellitus with other specified complication: Secondary | ICD-10-CM | POA: Diagnosis present

## 2020-06-30 DIAGNOSIS — I10 Essential (primary) hypertension: Secondary | ICD-10-CM | POA: Diagnosis present

## 2020-06-30 DIAGNOSIS — Z8249 Family history of ischemic heart disease and other diseases of the circulatory system: Secondary | ICD-10-CM

## 2020-06-30 LAB — COMPREHENSIVE METABOLIC PANEL
ALT: 15 U/L (ref 0–44)
AST: 28 U/L (ref 15–41)
Albumin: 3 g/dL — ABNORMAL LOW (ref 3.5–5.0)
Alkaline Phosphatase: 86 U/L (ref 38–126)
Anion gap: 9 (ref 5–15)
BUN: 23 mg/dL (ref 8–23)
CO2: 27 mmol/L (ref 22–32)
Calcium: 8.1 mg/dL — ABNORMAL LOW (ref 8.9–10.3)
Chloride: 100 mmol/L (ref 98–111)
Creatinine, Ser: 1.26 mg/dL — ABNORMAL HIGH (ref 0.44–1.00)
GFR, Estimated: 42 mL/min — ABNORMAL LOW (ref >60)
Glucose, Bld: 42 mg/dL — CL (ref 70–99)
Potassium: 4.2 mmol/L (ref 3.5–5.1)
Sodium: 136 mmol/L (ref 135–145)
Total Bilirubin: 0.8 mg/dL (ref 0.3–1.2)
Total Protein: 6.4 g/dL — ABNORMAL LOW (ref 6.5–8.1)

## 2020-06-30 LAB — CBC
HCT: 46.3 % — ABNORMAL HIGH (ref 36.0–46.0)
Hemoglobin: 14.3 g/dL (ref 12.0–15.0)
MCH: 29.4 pg (ref 26.0–34.0)
MCHC: 30.9 g/dL (ref 30.0–36.0)
MCV: 95.1 fL (ref 80.0–100.0)
Platelets: 173 10*3/uL (ref 150–400)
RBC: 4.87 MIL/uL (ref 3.87–5.11)
RDW: 14.5 % (ref 11.5–15.5)
WBC: 6.6 10*3/uL (ref 4.0–10.5)
nRBC: 0 % (ref 0.0–0.2)

## 2020-06-30 LAB — CBG MONITORING, ED
Glucose-Capillary: 38 mg/dL — CL (ref 70–99)
Glucose-Capillary: 169 mg/dL — ABNORMAL HIGH (ref 70–99)

## 2020-06-30 LAB — BASIC METABOLIC PANEL
Anion gap: 10 (ref 5–15)
BUN: 22 mg/dL (ref 8–23)
CO2: 27 mmol/L (ref 22–32)
Calcium: 8.3 mg/dL — ABNORMAL LOW (ref 8.9–10.3)
Chloride: 100 mmol/L (ref 98–111)
Creatinine, Ser: 1.25 mg/dL — ABNORMAL HIGH (ref 0.44–1.00)
GFR, Estimated: 43 mL/min — ABNORMAL LOW (ref >60)
Glucose, Bld: 43 mg/dL — CL (ref 70–99)
Potassium: 4 mmol/L (ref 3.5–5.1)
Sodium: 137 mmol/L (ref 135–145)

## 2020-06-30 LAB — TROPONIN I (HIGH SENSITIVITY): Troponin I (High Sensitivity): 17 ng/L (ref <18)

## 2020-06-30 LAB — CK: Total CK: 71 U/L (ref 38–234)

## 2020-06-30 MED ORDER — OSELTAMIVIR PHOSPHATE 30 MG PO CAPS
30.0000 mg | ORAL_CAPSULE | Freq: Once | ORAL | Status: AC
  Administered 2020-06-30: 30 mg via ORAL
  Filled 2020-06-30: qty 1

## 2020-06-30 MED ORDER — DEXTROSE 50 % IV SOLN
25.0000 g | Freq: Once | INTRAVENOUS | Status: AC
  Administered 2020-06-30: 25 g via INTRAVENOUS
  Filled 2020-06-30: qty 50

## 2020-06-30 NOTE — ED Provider Notes (Addendum)
Physical Exam Vitals and nursing note reviewed.  Constitutional:      General: She is not in acute distress.    Appearance: She is well-developed. She is not diaphoretic.  HENT:     Head: Normocephalic and atraumatic.  Eyes:     Conjunctiva/sclera: Conjunctivae normal.  Cardiovascular:     Rate and Rhythm: Normal rate and regular rhythm.     Pulses:          Posterior tibial pulses are 2+ on the right side and 2+ on the left side.  Pulmonary:     Effort: Tachypnea present.     Breath sounds: Rhonchi present.  Musculoskeletal:     Cervical back: Neck supple.     Right lower leg: 2+ Pitting Edema present.     Left lower leg: 2+ Pitting Edema present.  Skin:    General: Skin is warm and dry.     Coloration: Skin is not pale.  Neurological:     Mental Status: She is alert.  Psychiatric:        Behavior: Behavior normal.     Abnormal Labs Reviewed  CBC - Abnormal; Notable for the following components:      Result Value   HCT 46.3 (*)    All other components within normal limits  BASIC METABOLIC PANEL - Abnormal; Notable for the following components:   Glucose, Bld 43 (*)    Creatinine, Ser 1.25 (*)    Calcium 8.3 (*)    GFR, Estimated 43 (*)    All other components within normal limits  COMPREHENSIVE METABOLIC PANEL - Abnormal; Notable for the following components:   Glucose, Bld 42 (*)    Creatinine, Ser 1.26 (*)    Calcium 8.1 (*)    Total Protein 6.4 (*)    Albumin 3.0 (*)    GFR, Estimated 42 (*)    All other components within normal limits  CBG MONITORING, ED - Abnormal; Notable for the following components:   Glucose-Capillary 38 (*)    All other components within normal limits  CBG MONITORING, ED - Abnormal; Notable for the following components:   Glucose-Capillary 169 (*)    All other components within normal limits    DG Chest 2 View  Result Date: 06/30/2020 CLINICAL DATA:  Shortness of breath, vomiting EXAM: CHEST - 2 VIEW COMPARISON:  06/28/2020  FINDINGS: Patient is rotated. Stable cardiomegaly. Atherosclerotic calcification of the aortic knob. Pulmonary vascular congestion. Asymmetric prominence of the right hilar region is likely accentuated by rotation. Hazy left mid lung opacity. No pneumothorax. Bilateral rib fractures. Lateral left sixth rib fracture may be acute. Right-sided rib fractures appear unchanged from prior. IMPRESSION: 1. Hazy left mid lung opacity, which may reflect infiltrate. 2. Bilateral rib fractures. Lateral left sixth rib fracture may be acute. Correlate with point tenderness. 3. Cardiomegaly with pulmonary vascular congestion. Electronically Signed   By: Davina Poke D.O.   On: 06/30/2020 14:05   CT Chest Wo Contrast  Result Date: 06/30/2020 CLINICAL DATA:  Chest trauma, shortness of breath, cough EXAM: CT CHEST WITHOUT CONTRAST TECHNIQUE: Multidetector CT imaging of the chest was performed following the standard protocol without IV contrast. COMPARISON:  CT 12/31/2018 FINDINGS: Cardiovascular: Heart size is mildly enlarged. No pericardial effusion. Thoracic aorta is nonaneurysmal. Atherosclerotic calcifications of the aorta and coronary arteries. Main pulmonary trunk measures 3.4 cm in diameter. Mediastinum/Nodes: No axillary, mediastinal, or hilar lymphadenopathy. Surgical clips present in the bilateral axillary regions. Trachea within normal limits. Moderate-sized hiatal  hernia. Esophagus unremarkable. Lungs/Pleura: Trace right pleural effusion with mild bibasilar dependent atelectasis. Mild bronchial wall thickening. Lungs are otherwise clear. No pneumothorax. Upper Abdomen: No acute abnormality. Musculoskeletal: No acute osseous findings. Thoracic vertebral body heights are maintained. Mild scoliotic curvature. Multilevel degenerative disc disease. Incidental T11 vertebral body hemangioma. Advanced bilateral glenohumeral arthropathy. Bilateral mastectomies. No chest wall lesion. IMPRESSION: 1. Trace right pleural  effusion with mild bibasilar dependent atelectasis. 2. Mild cardiomegaly. 3. Moderate-sized hiatal hernia. 4. Mildly dilated main pulmonary trunk, which can be seen with pulmonary arterial hypertension. 5. Aortic and coronary artery atherosclerosis (ICD10-I70.0). Electronically Signed   By: Davina Poke D.O.   On: 06/30/2020 17:13     Clinical Course as of 06/30/20 2107  Sat Jun 30, 2020  1933 RN notified me patient's blood glucose is low.  I evaluated the patient as a team effort with Dr. Ralene Bathe.  Patient continues to be alert and she is oriented to her baseline level which is typically oriented to self. [SJ]  1954 Patient reevaluated.  Continues to be alert and oriented to baseline. [SJ]  2041 Spoke with Dr. Tonie Griffith, hospitalist. Agrees to admit the patient. [SJ]    Clinical Course User Index [SJ] Hyland Mollenkopf C, PA-C    Patient presents with complaint of shortness of breath.  Recently diagnosed with influenza.  Has had cough. Patient is in independent living, however, she has baseline confusion and this causes her to be a poor historian.  Suspect poor oral intake. Evidence of dehydration, dry on exam.  Hypoglycemia here in the ED.   Patient admitted for further management.   Vitals:   06/30/20 1324 06/30/20 1615 06/30/20 1845  BP: (!) 191/104 (!) 185/97 (!) 172/104  Pulse: 85 91 96  Resp: 20 19 17   Temp: 98.2 F (36.8 C)    TempSrc: Oral    SpO2: 95% 95% 97%         Lorayne Bender, PA-C 06/30/20 2109    Lorayne Bender, PA-C 06/30/20 2112    Quintella Reichert, MD 07/01/20 1301

## 2020-06-30 NOTE — ED Notes (Signed)
Pt caregiver stated pt was sluggish this morning and was unable to speak as she normally does. At this time the pt has no deficits per the caregiver, and is acting normally. The only complaint now is the coughing and congestion

## 2020-06-30 NOTE — ED Provider Notes (Signed)
[facility] EMERGENCY DEPARTMENT Provider Note   CSN: [id number] Arrival date & time: 06/30/20  1320     History Chief Complaint  Patient presents with  . Shortness of Breath    Diana [name] is a 87 y.o. female.  The history is provided by the patient, a caregiver and medical records.  Shortness of Breath  Diana [name] is a 87 y.o. female who presents to the Emergency Department complaining of sob.  Level V caveat due to confusion.    Difficulty breathing and speaking today.  Productive cough.  Resides at [facility].  Did not take meds.  Has people check her q2 hours. She has an additional caregiver 2 x/week four hours.  Now daily.    Got sick on Thursday - fell (noone saw it).  Takes eliquis.  Dx with flu.  Started meds for flu but didn't start until yesterday at 2pm and didn't get it this morning.      Past Medical History:  Diagnosis Date  . Anemia   . Anemia of decreased vitamin B12 absorption 05/26/2005  . Arthritis   . Atrial fibrillation ([place])   . Breast cancer ([place]) 2001  . Cataract   . GERD (gastroesophageal reflux disease)   . Hiatal hernia   . History of tobacco abuse   . Hx of colonic polyps   . Hyperlipidemia   . Hypertension   . Hypothyroidism   . Memory loss   . Obesity   . OSA on CPAP 09/2007   AHI 10.6/hr overall, 43.64/hr during REM, lost weight, does not use cpap  . Osteopenia   . Renal insufficiency   . Type 2 diabetes mellitus [facility])     Patient Active Problem List   Diagnosis Date Noted  . Closed dislocation of left elbow 03/19/2020  . Depression, recurrent ([place]) 01/21/2019  . Hyperlipidemia associated with type 2 diabetes mellitus ([place]) 01/21/2019  . Acute metabolic encephalopathy [id number]  . Fall 01/11/2019  . Klebsiella UTI (urinary tract infection) 01/11/2019  . Acute urinary retention 01/11/2019  . Left kidney lesion 01/11/2019  . Ulcerative esophagitis 01/11/2019  . Persistent  atrial fibrillation ([place])   . Anemia 01/01/2019  . Hiatal hernia   . Symptomatic anemia 12/31/2018  . Hypoglycemia 12/31/2018  . Ribs, multiple fractures 12/31/2018  . HTN (hypertension) 05/15/2013  . OSA on CPAP 05/15/2013  . Hypothyroidism 05/15/2013  . Secondary DM with CKD stage 3 and hypertension ([place]) 05/15/2013  . Lower extremity edema 05/15/2013  . Renal insufficiency 05/15/2013  . Morbid obesity ([place]) 05/15/2013  . Breast cancer ([place]) 03/18/2011    Past Surgical History:  Procedure Laterality Date  . BACK SURGERY  05/30/2009  . BIOPSY  01/01/2019   Procedure: BIOPSY;  Surgeon: Milus Banister, MD;  Location: [facility] ENDOSCOPY;  Service: Endoscopy;;  . BOWEL RESECTION  1974  . CATARACT EXTRACTION Bilateral    bilateral  . CHOLECYSTECTOMY  1974  . COLONOSCOPY    . ESOPHAGOGASTRODUODENOSCOPY N/A 01/01/2019   Procedure: ESOPHAGOGASTRODUODENOSCOPY (EGD);  Surgeon: Milus Banister, MD;  Location[facility] ENDOSCOPY;  Service: Endoscopy;  Laterality: N/A;  . EXTERNAL FIXATION REMOVAL Left 04/27/2020   Procedure: REMOVAL EXTERNAL FIXATION ARM;  Surgeon: Shona Needles, MD;  Location: [facility];  Service: Orthopedics;  Laterality: Left;  Marland Kitchen MASTECTOMY Bilateral 2001   bilateral sentinel lymph nodes bio  . NM MYOCAR PERF WALL MOTION  06/2007   dipyridamole; perfusion defect in inferior myocardium consistent with diaphragmatic attenuation,  remaining myocardium with no ischemia/infarct, EF 73%; normal, low risk scan   . ORIF ELBOW FRACTURE Left 03/21/2020   Procedure: OPEN REDUCTION INTERNAL FIXATION (ORIF) ELBOW/OLECRANON FRACTURE;  Surgeon: Shona Needles, MD;  Location: [facility];  Service: Orthopedics;  Laterality: Left;  . TOTAL ABDOMINAL HYSTERECTOMY  1975  . TRANSTHORACIC ECHOCARDIOGRAM  02/2010   MP=>53%, stage 1 diastolic dysfunction; borderline RV enlargement; LA mild-mod dilated; mild mitral annular calcif & mild MR; mild TR with normal RSVP, AV moderately sclerotics     OB History   No  obstetric history on file.     Family History  Problem Relation Age of Onset  . Heart attack Mother   . Heart attack Father   . Colon cancer Neg Hx     Social History   Tobacco Use  . Smoking status: Former Smoker    Years: 13.00    Types: Cigarettes    Quit date: 05/02/2002    Years since quitting: 18.1  . Smokeless tobacco: Never Used  Vaping Use  . Vaping Use: Never used  Substance Use Topics  . Alcohol use: No    Alcohol/week: 0.0 standard drinks  . Drug use: Never    Home Medications Prior to Admission medications   Medication Sig Start Date End Date Taking? Authorizing Provider  amLODipine (NORVASC) 10 MG tablet Take 1 tablet (10 mg total) by mouth daily. Patient not taking: No sig reported 05/27/20   Ngetich, Dinah C, NP  apixaban (ELIQUIS) 2.5 MG TABS tablet Take 1 tablet (2.5 mg total) by mouth 2 (two) times daily. 05/26/20   Medina-Vargas, Monina C, NP  brimonidine-timolol (COMBIGAN) 0.2-0.5 % ophthalmic solution Place 1 drop into both eyes every 12 (twelve) hours. 05/26/20   Medina-Vargas, Monina C, NP  ferrous sulfate 325 (65 FE) MG tablet Take 1 tablet (325 mg total) by mouth 2 (two) times daily with a meal. Patient taking differently: Take 325 mg by mouth daily with breakfast. 04/14/20   Medina-Vargas, Monina C, NP  FLUoxetine (PROZAC) 20 MG capsule Take 1 capsule (20 mg total) by mouth daily. 05/26/20   Medina-Vargas, Monina C, NP  glimepiride (AMARYL) 2 MG tablet Take 2 mg by mouth in the morning and at bedtime.    [provider]  HYDROcodone-acetaminophen (NORCO/VICODIN) 5-325 MG tablet Take 1 tablet by mouth every 6 (six) hours as needed for severe pain. Patient not taking: No sig reported 04/14/20   Medina-Vargas, Monina C, NP  insulin glargine (LANTUS) 100 UNIT/ML Solostar Pen Inject 14 Units into the skin at bedtime. Patient taking differently: Inject 7 Units into the skin at bedtime. 05/26/20   Medina-Vargas, Monina C, NP  levothyroxine (SYNTHROID) 50  MCG tablet Take 1 tablet (50 mcg total) by mouth daily. 05/26/20   Medina-Vargas, Monina C, NP  losartan (COZAAR) 50 MG tablet Take 1 tablet (50 mg total) by mouth 2 (two) times daily. 05/26/20   Medina-Vargas, Monina C, NP  metFORMIN (GLUCOPHAGE) 500 MG tablet Take 500 mg by mouth daily with breakfast.    [provider]  metFORMIN (GLUCOPHAGE-XR) 500 MG 24 hr tablet Take 1 tablet (500 mg total) by mouth daily. Patient not taking: No sig reported 05/26/20   Medina-Vargas, Monina C, NP  metoprolol tartrate (LOPRESSOR) 25 MG tablet Take 0.5 tablets (12.5 mg total) by mouth 2 (two) times daily. 05/26/20   Medina-Vargas, Monina C, NP  Multiple Vitamins-Minerals (PRESERVISION AREDS 2 PO) Take 1 capsule by mouth daily.    [provider]  ondansetron (ZOFRAN) 4 MG tablet Take 1 tablet (4 mg total) by mouth every 6 (six) hours as needed for nausea or vomiting. Patient not taking: No sig reported 04/14/20   Medina-Vargas, Monina C, NP  oseltamivir (TAMIFLU) 75 MG capsule Take 1 capsule (75 mg total) by mouth every 12 (twelve) hours. 06/28/20   Noemi Chapel, MD  pantoprazole (PROTONIX) 40 MG tablet Take 1 tablet (40 mg total) by mouth 2 (two) times daily before a meal. 05/26/20   Medina-Vargas, Monina C, NP  polyethylene glycol (MIRALAX / GLYCOLAX) 17 g packet Take 17 g by mouth daily as needed for mild constipation. Patient not taking: No sig reported 03/23/20   Delray Alt, PA-C  rosuvastatin (CRESTOR) 10 MG tablet Take 10 mg by mouth at bedtime.    [provider]  rosuvastatin (CRESTOR) 5 MG tablet Take 1 tablet (5 mg total) by mouth daily. Patient not taking: No sig reported 05/26/20   Medina-Vargas, Monina C, NP    Allergies    Codeine sulfate and Other  Review of Systems   Review of Systems  Respiratory: Positive for shortness of breath.   All other systems reviewed and are negative.   Physical Exam Updated Vital Signs BP (!) 172/104   Pulse 96   Temp 98.2 F (36.8  C) (Oral)   Resp 17   LMP  (LMP Unknown)   SpO2 97%   Physical Exam Vitals and nursing note reviewed.  Constitutional:      Appearance: She is well-developed.  HENT:     Head: Normocephalic and atraumatic.  Cardiovascular:     Rate and Rhythm: Normal rate and regular rhythm.     Heart sounds: No murmur heard.   Pulmonary:     Effort: Pulmonary effort is normal. No respiratory distress.     Comments: occasional rhonchi in the bases Abdominal:     Palpations: Abdomen is soft.     Tenderness: There is no abdominal tenderness. There is no guarding or rebound.  Musculoskeletal:        General: No tenderness.     Comments: Pitting edema to ble  Skin:    General: Skin is warm and dry.  Neurological:     Mental Status: She is alert.     Comments: Oriented to person. Disoriented to time and recent events. Generalized weakness  Psychiatric:        Behavior: Behavior normal.     ED Results / Procedures / Treatments   Labs (all labs ordered are listed, but only abnormal results are displayed) Labs Reviewed  CBC - Abnormal; Notable for the following components:      Result Value   HCT 46.3 (*)    All other components within normal limits  BASIC METABOLIC PANEL - Abnormal; Notable for the following components:   Glucose, Bld 43 (*)    Creatinine, Ser 1.25 (*)    Calcium 8.3 (*)    GFR, Estimated 43 (*)    All other components within normal limits  COMPREHENSIVE METABOLIC PANEL - Abnormal; Notable for the following components:   Glucose, Bld 42 (*)    Creatinine, Ser 1.26 (*)    Calcium 8.1 (*)    Total Protein 6.4 (*)    Albumin 3.0 (*)    GFR, Estimated 42 (*)    All other components within normal limits  CBG MONITORING, ED - Abnormal; Notable for the following components:   Glucose-Capillary 38 (*)    All other components within normal  limits  CBG MONITORING, ED - Abnormal; Notable for the following components:   Glucose-Capillary 169 (*)    All other components  within normal limits  RESP PANEL BY RT-PCR (FLU A&B, COVID) [id number]  CK  TROPONIN I (HIGH SENSITIVITY)    EKG None  Radiology DG Chest 2 View  Result Date: 06/30/2020 CLINICAL DATA:  Shortness of breath, vomiting EXAM: CHEST - 2 VIEW COMPARISON:  06/28/2020 FINDINGS: Patient is rotated. Stable cardiomegaly. Atherosclerotic calcification of the aortic knob. Pulmonary vascular congestion. Asymmetric prominence of the right hilar region is likely accentuated by rotation. Hazy left mid lung opacity. No pneumothorax. Bilateral rib fractures. Lateral left sixth rib fracture may be acute. Right-sided rib fractures appear unchanged from prior. IMPRESSION: 1. Hazy left mid lung opacity, which may reflect infiltrate. 2. Bilateral rib fractures. Lateral left sixth rib fracture may be acute. Correlate with point tenderness. 3. Cardiomegaly with pulmonary vascular congestion. Electronically Signed   By: Davina Poke D.O.   On: 06/30/2020 14:05   CT Chest Wo Contrast  Result Date: 06/30/2020 CLINICAL DATA:  Chest trauma, shortness of breath, cough EXAM: CT CHEST WITHOUT CONTRAST TECHNIQUE: Multidetector CT imaging of the chest was performed following the standard protocol without IV contrast. COMPARISON:  CT 12/31/2018 FINDINGS: Cardiovascular: Heart size is mildly enlarged. No pericardial effusion. Thoracic aorta is nonaneurysmal. Atherosclerotic calcifications of the aorta and coronary arteries. Main pulmonary trunk measures 3.4 cm in diameter. Mediastinum/Nodes: No axillary, mediastinal, or hilar lymphadenopathy. Surgical clips present in the bilateral axillary regions. Trachea within normal limits. Moderate-sized hiatal hernia. Esophagus unremarkable. Lungs/Pleura: Trace right pleural effusion with mild bibasilar dependent atelectasis. Mild bronchial wall thickening. Lungs are otherwise clear. No pneumothorax. Upper Abdomen: No acute abnormality. Musculoskeletal: No acute osseous findings. Thoracic  vertebral body heights are maintained. Mild scoliotic curvature. Multilevel degenerative disc disease. Incidental T11 vertebral body hemangioma. Advanced bilateral glenohumeral arthropathy. Bilateral mastectomies. No chest wall lesion. IMPRESSION: 1. Trace right pleural effusion with mild bibasilar dependent atelectasis. 2. Mild cardiomegaly. 3. Moderate-sized hiatal hernia. 4. Mildly dilated main pulmonary trunk, which can be seen with pulmonary arterial hypertension. 5. Aortic and coronary artery atherosclerosis (ICD10-I70.0). Electronically Signed   By: Davina Poke D.O.   On: 06/30/2020 17:13    Procedures Procedures   Medications Ordered in ED Medications  oseltamivir (TAMIFLU) capsule 30 mg (30 mg Oral Given 06/30/20 1842)  dextrose 50 % solution 25 g (25 g Intravenous Given 06/30/20 1947)    ED Course  I have reviewed the triage vital signs and the nursing notes.  Pertinent labs & imaging results that were available during my care of the patient were reviewed by me and considered in my medical decision making (see chart for details).  Clinical Course as of 06/30/20 2108  Sat Jun 30, 2020  1933 RN notified me patient's blood glucose is low.  I evaluated the patient as a team effort with Dr. Ralene Bathe.  Patient continues to be alert and she is oriented to her baseline level which is typically oriented to self. [SJ]  1954 Patient reevaluated.  Continues to be alert and oriented to baseline. [SJ]  2041 Spoke with Dr. Tonie Griffith, hospitalist. Agrees to admit the patient. [SJ]    Clinical Course User Index [SJ] Joy, Shawn C, PA-C   MDM Rules/Calculators/A&P                         Pt here for evaluation of decline in mental status, cough, congestion -  just had a fall and was diagnosed with flu. She returned was prescribed Tamiflu but is only received one dose since hospital discharge due to delay in obtaining the medication. She has no focal neurologic deficits on examination does have  frequent coughing. Chest x-ray with possible rib fractures and infiltrate. CT scan was obtained to further evaluate. CT scan is negative for acute fracture or infiltrate. Lab retur of n with hypoglycemia. She was treated with D50, oral glucose. She did have a recurrent episode of hyperglycemia and she was treated again. She does have poor oral intake. Plan to admit for observation for poor oral intake, hypoglycemia in the setting of influenza A.  Final Clinical Impression(s) / ED Diagnoses Final diagnoses:  Influenza A  Hypoglycemia    Rx / DC Orders ED Discharge Orders    None       Quintella Reichert, MD 06/30/20 2108

## 2020-06-30 NOTE — ED Triage Notes (Signed)
Pt to triage via [facility] from [facility].  Pt was lying supine when caregiver came in and pt had SOB.  Breathing improved when she sat up.  Diagnosed with flu 2 days ago and started Tamiflu yesterday.  Productve cough.  Vomited with EMS.  Zofran 4mg  IM given by EMS.  Denies pain.

## 2020-06-30 NOTE — ED Provider Notes (Signed)
MSE was initiated and I personally evaluated the patient and placed orders (if any) at  1:38 PM on June 30, 2020.  Patient placed in Quick Look pathway, seen and evaluated   Chief Complaint: SOB  HPI:   Patient is 87 year old female was seen in the 2 days ago after a fall and diagnosed with influenza.  Seems that she was found at SNF laying supine on the ground when caregiver came in this morning.  ROS: Denies any symptoms presently (one) earlier was having nausea and shortness of breath.  Physical Exam:   Gen: No distress  Neuro: Awake and Alert  Skin: Warm    Focused Exam: Patient is chronically ill 87 year old woman does not appear to be in any acute acute distress.  No tachypnea, SPO2 within normal limits, no tachycardia  Lab work obtained.  Given that I have minimal history on how long she was on the floor will obtain CK in addition to basic labs, chest x-ray, EKG.  Initiation of care has begun. The patient has been counseled on the process, plan, and necessity for staying for the completion/evaluation, and the remainder of the medical screening examination    The patient appears stable so that the remainder of the MSE may be completed by another provider.   Diana [name] [place], [place] 06/30/20 1345    Quintella Reichert, MD 06/30/20 1356

## 2020-06-30 NOTE — ED Notes (Addendum)
Nurse stated that daughter can sit with pt in lobby

## 2020-06-30 NOTE — H&P (Signed)
History and Physical    Diana [name] [id number] DOB: 10-30-1933 DOA: 06/30/2020  PCP: Christain Sacramento, MD   Patient coming from: [facility]  Chief Complaint: Shortness of breath  HPI: Diana [name] is a 87 y.o. female with medical history significant for atrial fibrillation, hypothyroidism, hypertension, diabetes mellitus type 2, chronic kidney disease stage III, history of breast cancer, hyperlipidemia who presents for evaluation of shortness of breath.  Her daughter came to visit her around noon today and when she was sitting with her she noticed that she appeared to have difficulty breathing.  Patient states she felt something is caught in her throat and she was not breathing as easily as she normally does.  A mild nonproductive cough but did not have any fever.  Denies any chest pain or pressure.  She has not had any palpitations.  She has had generalized weakness for the last few days and has had a couple of falls over the last 3 to 4 days.  She was seen 2 days ago in the emergency room after a fall and was found to not have any fractures but was positive for influenza A and started on Tamiflu.  She did not start the medication until yesterday due to having trouble getting the prescription filled she states.  She reports she has not had much of an appetite the last few days and did not eat dinner last night or any meals today.  States that the staff this was to help her with her medications at the facility but have not been consistent with this.  Patient does not remember if she took her medications this morning.  The pharmacy tech is called the facility and when they checked her room her morning pills were still in her pillbox this afternoon.  He states she did take her medications last night but she is not sure how much Lantus she used.  ED Course: In the emergency room patient is found to be hemodynamically stable.  She has atrial fibrillation with normal rate.   He was initially hypoglycemic blood sugar 43.  She was given oral glucose and dextrose and blood sugar has improved.  Chest x-ray stated there is a slight haziness in the left midlung possible rib fractures and CT of her chest was obtained which showed no infiltrate or fractures of her ribs.  She does not have elevated white blood cell count and electrolytes are stable.  Kidney function is at baseline level  Review of Systems:  General: Generalized weakness.  Denies fever, chills, weight loss, night sweats.  Denies dizziness.  Reports decreased appetite HENT: Denies head trauma, headache, denies change in hearing, tinnitus.  Denies nasal congestion or bleeding.  Denies sore throat, sores in mouth.  Denies difficulty swallowing Eyes: Denies blurry vision, pain in eye, drainage.  Denies discoloration of eyes. Neck: Denies pain.  Denies swelling.  Denies pain with movement. Cardiovascular: Denies chest pain, palpitations.  Denies edema.  Denies orthopnea Respiratory: Reports shortness of breath with nonproductive cough.  Denies wheezing.  Denies sputum production Gastrointestinal: Denies abdominal pain, swelling.  Denies nausea, vomiting, diarrhea.  Denies melena.  Denies hematemesis. Musculoskeletal: Denies limitation of movement.  Denies deformity or swelling.  Denies pain.  Denies arthralgias or myalgias. Genitourinary: Denies pelvic pain.  Denies urinary frequency or hesitancy.  Denies dysuria.  Skin: Denies rash.  Denies petechiae, purpura, ecchymosis. Neurological: Denies syncope. Denies seizure activity. Denies paresthesia. Denies slurred speech, drooping face.  Denies visual change. Psychiatric:  Denies depression, anxiety. Denies hallucinations.  Past Medical History:  Diagnosis Date  . Anemia   . Anemia of decreased vitamin B12 absorption 05/26/2005  . Arthritis   . Atrial fibrillation ([place])   . Breast cancer ([place]) 2001  . Cataract   . GERD (gastroesophageal reflux disease)   . Hiatal  hernia   . History of tobacco abuse   . Hx of colonic polyps   . Hyperlipidemia   . Hypertension   . Hypothyroidism   . Memory loss   . Obesity   . OSA on CPAP 09/2007   AHI 10.6/hr overall, 43.64/hr during REM, lost weight, does not use cpap  . Osteopenia   . Renal insufficiency   . Type 2 diabetes mellitus ([place])     Past Surgical History:  Procedure Laterality Date  . BACK SURGERY  05/30/2009  . BIOPSY  01/01/2019   Procedure: BIOPSY;  Surgeon: Milus Banister, MD;  Location: [facility] ENDOSCOPY;  Service: Endoscopy;;  . BOWEL RESECTION  1974  . CATARACT EXTRACTION Bilateral    bilateral  . CHOLECYSTECTOMY  1974  . COLONOSCOPY    . ESOPHAGOGASTRODUODENOSCOPY N/A 01/01/2019   Procedure: ESOPHAGOGASTRODUODENOSCOPY (EGD);  Surgeon: Milus Banister, MD;  Location: [facility] ENDOSCOPY;  Service: Endoscopy;  Laterality: N/A;  . EXTERNAL FIXATION REMOVAL Left 04/27/2020   Procedure: REMOVAL EXTERNAL FIXATION ARM;  Surgeon: Shona Needles, MD;  Location: [facility];  Service: Orthopedics;  Laterality: Left;  Marland Kitchen MASTECTOMY Bilateral 2001   bilateral sentinel lymph nodes bio  . NM MYOCAR PERF WALL MOTION  06/2007   dipyridamole; perfusion defect in inferior myocardium consistent with diaphragmatic attenuation, remaining myocardium with no ischemia/infarct, EF 73%; normal, low risk scan   . ORIF ELBOW FRACTURE Left 03/21/2020   Procedure: OPEN REDUCTION INTERNAL FIXATION (ORIF) ELBOW/OLECRANON FRACTURE;  Surgeon: Shona Needles, MD;  Location: [facility];  Service: Orthopedics;  Laterality: Left;  . TOTAL ABDOMINAL HYSTERECTOMY  1975  . TRANSTHORACIC ECHOCARDIOGRAM  02/2010   HU=>31%, stage 1 diastolic dysfunction; borderline RV enlargement; LA mild-mod dilated; mild mitral annular calcif & mild MR; mild TR with normal RSVP, AV moderately sclerotics    Social History  reports that she quit smoking about 18 years ago. Her smoking use included cigarettes. She quit after 13.00 years of use. She has never used  smokeless tobacco. She reports that she does not drink alcohol and does not use drugs.  Allergies  Allergen Reactions  . Codeine Sulfate Nausea Only  . Other     Sodium polyoxyethylene tridecyl sulfate- PER MAR    Family History  Problem Relation Age of Onset  . Heart attack Mother   . Heart attack Father   . Colon cancer Neg Hx      Prior to Admission medications   Medication Sig Start Date End Date Taking? Authorizing Provider  amLODipine (NORVASC) 10 MG tablet Take 1 tablet (10 mg total) by mouth daily. Patient not taking: No sig reported 05/27/20   Ngetich, Dinah C, NP  apixaban (ELIQUIS) 2.5 MG TABS tablet Take 1 tablet (2.5 mg total) by mouth 2 (two) times daily. 05/26/20   Medina-Vargas, Monina C, NP  brimonidine-timolol (COMBIGAN) 0.2-0.5 % ophthalmic solution Place 1 drop into both eyes every 12 (twelve) hours. 05/26/20   Medina-Vargas, Monina C, NP  ferrous sulfate 325 (65 FE) MG tablet Take 1 tablet (325 mg total) by mouth 2 (two) times daily with a meal. Patient taking differently: Take 325 mg by mouth daily with breakfast.  04/14/20   Medina-Vargas, Monina C, NP  FLUoxetine (PROZAC) 20 MG capsule Take 1 capsule (20 mg total) by mouth daily. 05/26/20   Medina-Vargas, Monina C, NP  glimepiride (AMARYL) 2 MG tablet Take 2 mg by mouth in the morning and at bedtime.    [provider]  HYDROcodone-acetaminophen (NORCO/VICODIN) 5-325 MG tablet Take 1 tablet by mouth every 6 (six) hours as needed for severe pain. Patient not taking: No sig reported 04/14/20   Medina-Vargas, Monina C, NP  insulin glargine (LANTUS) 100 UNIT/ML Solostar Pen Inject 14 Units into the skin at bedtime. Patient taking differently: Inject 7 Units into the skin at bedtime. 05/26/20   Medina-Vargas, Monina C, NP  levothyroxine (SYNTHROID) 50 MCG tablet Take 1 tablet (50 mcg total) by mouth daily. 05/26/20   Medina-Vargas, Monina C, NP  losartan (COZAAR) 50 MG tablet Take 1 tablet (50 mg total) by mouth 2  (two) times daily. 05/26/20   Medina-Vargas, Monina C, NP  metFORMIN (GLUCOPHAGE) 500 MG tablet Take 500 mg by mouth daily with breakfast.    [provider]  metFORMIN (GLUCOPHAGE-XR) 500 MG 24 hr tablet Take 1 tablet (500 mg total) by mouth daily. Patient not taking: No sig reported 05/26/20   Medina-Vargas, Monina C, NP  metoprolol tartrate (LOPRESSOR) 25 MG tablet Take 0.5 tablets (12.5 mg total) by mouth 2 (two) times daily. 05/26/20   Medina-Vargas, Monina C, NP  Multiple Vitamins-Minerals (PRESERVISION AREDS 2 PO) Take 1 capsule by mouth daily.    [provider]  ondansetron (ZOFRAN) 4 MG tablet Take 1 tablet (4 mg total) by mouth every 6 (six) hours as needed for nausea or vomiting. Patient not taking: No sig reported 04/14/20   Medina-Vargas, Monina C, NP  oseltamivir (TAMIFLU) 75 MG capsule Take 1 capsule (75 mg total) by mouth every 12 (twelve) hours. 06/28/20   Noemi Chapel, MD  pantoprazole (PROTONIX) 40 MG tablet Take 1 tablet (40 mg total) by mouth 2 (two) times daily before a meal. 05/26/20   Medina-Vargas, Monina C, NP  polyethylene glycol (MIRALAX / GLYCOLAX) 17 g packet Take 17 g by mouth daily as needed for mild constipation. Patient not taking: No sig reported 03/23/20   Delray Alt, PA-C  rosuvastatin (CRESTOR) 10 MG tablet Take 10 mg by mouth at bedtime.    [provider]  rosuvastatin (CRESTOR) 5 MG tablet Take 1 tablet (5 mg total) by mouth daily. Patient not taking: No sig reported 05/26/20   Durenda Age C, NP    Physical Exam: Vitals:   06/30/20 1324 06/30/20 1615 06/30/20 1845  BP: (!) 191/104 (!) 185/97 (!) 172/104  Pulse: 85 91 96  Resp: 20 19 17   Temp: 98.2 F (36.8 C)    TempSrc: Oral    SpO2: 95% 95% 97%    Constitutional: NAD, calm, comfortable Vitals:   06/30/20 1324 06/30/20 1615 06/30/20 1845  BP: (!) 191/104 (!) 185/97 (!) 172/104  Pulse: 85 91 96  Resp: 20 19 17   Temp: 98.2 F (36.8 C)    TempSrc: Oral     SpO2: 95% 95% 97%   General: WDWN, Alert and oriented x3.  Eyes: EOMI, PERRL, conjunctivae normal.  Sclera nonicteric HENT:  [place]/AT, external ears normal.  Nares patent without epistasis.  Mucous membranes are dry Neck: Soft, normal range of motion, supple, no masses, no thyromegaly.  Trachea midline Respiratory: Equal breath sounds.  Diffuse scattered rales.  Mild bibasilar rhonchi.  No wheezing, no crackles. Normal respiratory  effort. No accessory muscle use.  Cardiovascular: Irregularly irregular rhythm with normal rate., no murmurs / rubs / gallops. +1 lower extremity edema. 1+ pedal pulses..  Abdomen: Soft, no tenderness, nondistended, no rebound or guarding.  Obese.  No masses palpated. Bowel sounds normoactive Musculoskeletal: FROM. no cyanosis. No joint deformity upper and lower extremities. no contractures. Normal muscle tone.  Skin: Warm, dry, intact no rashes, lesions, ulcers. No induration Neurologic: CN 2-12 grossly intact. Normal speech. Sensation intact. Strength 4/5 in all extremities.   Psychiatric: Normal judgment and insight.  Normal mood.    Labs on Admission: I have personally reviewed following labs and imaging studies  CBC: Recent Labs  Lab 06/28/20 1018 06/30/20 1422  WBC 9.8 6.6  HGB 12.5 14.3  HCT 40.3 46.3*  MCV 93.9 95.1  PLT 189 [id number]    Basic Metabolic Panel: Recent Labs  Lab 06/28/20 1018 06/30/20 1814  NA 137 137  136  K 4.4 4.0  4.2  CL 101 100  100  CO2 25 27  27   GLUCOSE 154* 43*  42*  BUN 24* 22  23  CREATININE 1.46* 1.25*  1.26*  CALCIUM 8.5* 8.3*  8.1*    GFR: Estimated Creatinine Clearance: 38 mL/min (A) (by C-G formula based on SCr of 1.25 mg/dL (H)).  Liver Function Tests: Recent Labs  Lab 06/28/20 1018 06/30/20 1814  AST 19 28  ALT 13 15  ALKPHOS 89 86  BILITOT 1.0 0.8  PROT 6.2* 6.4*  ALBUMIN 3.1* 3.0*    Urine analysis:    Component Value Date/Time   COLORURINE YELLOW 06/28/2020 1010   APPEARANCEUR HAZY  (A) 06/28/2020 1010   LABSPEC 1.019 06/28/2020 1010   PHURINE 5.0 06/28/2020 1010   GLUCOSEU NEGATIVE 06/28/2020 1010   HGBUR NEGATIVE 06/28/2020 1010   BILIRUBINUR NEGATIVE 06/28/2020 1010   KETONESUR NEGATIVE 06/28/2020 1010   PROTEINUR 100 (A) 06/28/2020 1010   UROBILINOGEN 0.2 07/05/2009 0331   NITRITE NEGATIVE 06/28/2020 1010   LEUKOCYTESUR NEGATIVE 06/28/2020 1010    Radiological Exams on Admission: DG Chest 2 View  Result Date: 06/30/2020 CLINICAL DATA:  Shortness of breath, vomiting EXAM: CHEST - 2 VIEW COMPARISON:  06/28/2020 FINDINGS: Patient is rotated. Stable cardiomegaly. Atherosclerotic calcification of the aortic knob. Pulmonary vascular congestion. Asymmetric prominence of the right hilar region is likely accentuated by rotation. Hazy left mid lung opacity. No pneumothorax. Bilateral rib fractures. Lateral left sixth rib fracture may be acute. Right-sided rib fractures appear unchanged from prior. IMPRESSION: 1. Hazy left mid lung opacity, which may reflect infiltrate. 2. Bilateral rib fractures. Lateral left sixth rib fracture may be acute. Correlate with point tenderness. 3. Cardiomegaly with pulmonary vascular congestion. Electronically Signed   By: Davina Poke D.O.   On: 06/30/2020 14:05   CT Chest Wo Contrast  Result Date: 06/30/2020 CLINICAL DATA:  Chest trauma, shortness of breath, cough EXAM: CT CHEST WITHOUT CONTRAST TECHNIQUE: Multidetector CT imaging of the chest was performed following the standard protocol without IV contrast. COMPARISON:  CT 12/31/2018 FINDINGS: Cardiovascular: Heart size is mildly enlarged. No pericardial effusion. Thoracic aorta is nonaneurysmal. Atherosclerotic calcifications of the aorta and coronary arteries. Main pulmonary trunk measures 3.4 cm in diameter. Mediastinum/Nodes: No axillary, mediastinal, or hilar lymphadenopathy. Surgical clips present in the bilateral axillary regions. Trachea within normal limits. Moderate-sized hiatal  hernia. Esophagus unremarkable. Lungs/Pleura: Trace right pleural effusion with mild bibasilar dependent atelectasis. Mild bronchial wall thickening. Lungs are otherwise clear. No pneumothorax. Upper Abdomen: No acute abnormality. Musculoskeletal: No  acute osseous findings. Thoracic vertebral body heights are maintained. Mild scoliotic curvature. Multilevel degenerative disc disease. Incidental T11 vertebral body hemangioma. Advanced bilateral glenohumeral arthropathy. Bilateral mastectomies. No chest wall lesion. IMPRESSION: 1. Trace right pleural effusion with mild bibasilar dependent atelectasis. 2. Mild cardiomegaly. 3. Moderate-sized hiatal hernia. 4. Mildly dilated main pulmonary trunk, which can be seen with pulmonary arterial hypertension. 5. Aortic and coronary artery atherosclerosis (ICD10-I70.0). Electronically Signed   By: Davina Poke D.O.   On: 06/30/2020 17:13    EKG: Independently reviewed.  EKG shows atrial fibrillation with normal rate.  No acute ST elevation or depression.  QTc 472  Assessment/Plan Principal Problem:   Hypoglycemia diabetes mellitus type 2 Ms. Januszewski will be placed on medical telemetry for observation. She was given dextrose in Er and blood glucose has improved. Will monitor closely with Q 2 hr CBG overnight.  Is unclear which medications patient took last night.  Per report from the spoke with the independent facility personnel patient's medications for this morning were still in her pillbox in her room.  She is on Amaryl as well as Lantus and metformin. She took Lantus, metformin and Amaryl last night and did not eat dinner or breakfast this morning which could lead to significant hypoglycemia.  Patient is not a good historian and if she is doing her medications on her own she has high risk for further hypoglycemic episodes  Active Problems:   HTN (hypertension) Continue home medication of Cozaar.  Monitor blood pressure.    Persistent atrial fibrillation   Stable.  Continue Eliquis for chronic anticoagulation    Generalized weakness Patient with generalized weakness and has had multiple falls over the last few days Consult physical therapy for evaluation in the morning Patient is high fall risk.  Placed on fall precautions    CKD (chronic kidney disease) stage 3, GFR 30-59 ml/min  Stable    Dyspnea Patient reports having shortness of breath earlier which is now stabilized.  She is on room air without hypoxia and is not requiring supplemental oxygen. May be secondary to recent influenza infection    Influenza A Patient diagnosed with influenza A 2 days ago and was started on Tamiflu at that time.  Continue Tamiflu    DVT prophylaxis: Pt is anticoagulated on Eliquis Code Status:   Full Code  Family Communication:  Discussed with patient and her daughter who is at bedside.  They agree with plan.  Further recommendation follow as clinically indicated Disposition Plan:   Patient is from:  Independent living facility  Anticipated DC to:  Independent living facility  Anticipated DC date:  Anticipate discharge in next 24 to 36 hours  Anticipated DC barriers: No barriers to discharge identified at this time  Admission status:  Observation   Eben Burow MD [facility]  How to contact the [facility] Attending or Consulting provider [place] or covering provider during after hours [place], for this patient?   1. Check the care team in [facility] and look for a) attending/consulting [facility] provider listed and b) the [facility] team listed 2. Log into [email or web address] and use [place]'s universal password to access. If you do not have the password, please contact the hospital operator. 3. Locate the [facility] provider you are looking for under [facility] and page to a number that you can be directly reached. 4. If you still have difficulty reaching the provider, please page the [facility] (Director on Call) for the Hospitalists listed on amion for  assistance.  06/30/2020, 9:25 PM

## 2020-07-01 DIAGNOSIS — E162 Hypoglycemia, unspecified: Secondary | ICD-10-CM | POA: Diagnosis not present

## 2020-07-01 LAB — BASIC METABOLIC PANEL
Anion gap: 9 (ref 5–15)
BUN: 21 mg/dL (ref 8–23)
CO2: 24 mmol/L (ref 22–32)
Calcium: 8.1 mg/dL — ABNORMAL LOW (ref 8.9–10.3)
Chloride: 99 mmol/L (ref 98–111)
Creatinine, Ser: 1.21 mg/dL — ABNORMAL HIGH (ref 0.44–1.00)
GFR, Estimated: 44 mL/min — ABNORMAL LOW (ref >60)
Glucose, Bld: 74 mg/dL (ref 70–99)
Potassium: 4.1 mmol/L (ref 3.5–5.1)
Sodium: 132 mmol/L — ABNORMAL LOW (ref 135–145)

## 2020-07-01 LAB — CBC
HCT: 40.2 % (ref 36.0–46.0)
Hemoglobin: 12.6 g/dL (ref 12.0–15.0)
MCH: 28.9 pg (ref 26.0–34.0)
MCHC: 31.3 g/dL (ref 30.0–36.0)
MCV: 92.2 fL (ref 80.0–100.0)
Platelets: 182 10*3/uL (ref 150–400)
RBC: 4.36 MIL/uL (ref 3.87–5.11)
RDW: 14.6 % (ref 11.5–15.5)
WBC: 3.8 10*3/uL — ABNORMAL LOW (ref 4.0–10.5)
nRBC: 0 % (ref 0.0–0.2)

## 2020-07-01 LAB — RESP PANEL BY RT-PCR (FLU A&B, COVID) ARPGX2
Influenza A by PCR: POSITIVE — AB
Influenza B by PCR: NEGATIVE
SARS Coronavirus 2 by RT PCR: NEGATIVE

## 2020-07-01 LAB — GLUCOSE, CAPILLARY
Glucose-Capillary: 142 mg/dL — ABNORMAL HIGH (ref 70–99)
Glucose-Capillary: 306 mg/dL — ABNORMAL HIGH (ref 70–99)
Glucose-Capillary: 251 mg/dL — ABNORMAL HIGH (ref 70–99)

## 2020-07-01 LAB — CBG MONITORING, ED
Glucose-Capillary: 84 mg/dL (ref 70–99)
Glucose-Capillary: 78 mg/dL (ref 70–99)

## 2020-07-01 LAB — HEMOGLOBIN A1C
Hgb A1c MFr Bld: 6.9 % — ABNORMAL HIGH (ref 4.8–5.6)
Mean Plasma Glucose: 151.33 mg/dL

## 2020-07-01 MED ORDER — LOSARTAN POTASSIUM 50 MG PO TABS
50.0000 mg | ORAL_TABLET | Freq: Two times a day (BID) | ORAL | Status: DC
  Administered 2020-07-01 (×3): 50 mg via ORAL
  Filled 2020-07-01 (×4): qty 1

## 2020-07-01 MED ORDER — METOPROLOL TARTRATE 12.5 MG HALF TABLET
12.5000 mg | ORAL_TABLET | Freq: Two times a day (BID) | ORAL | Status: DC
  Administered 2020-07-01 – 2020-07-06 (×12): 12.5 mg via ORAL
  Filled 2020-07-01 (×12): qty 1

## 2020-07-01 MED ORDER — FLUOXETINE HCL 20 MG PO CAPS
20.0000 mg | ORAL_CAPSULE | Freq: Every day | ORAL | Status: DC
  Administered 2020-07-01 – 2020-07-06 (×6): 20 mg via ORAL
  Filled 2020-07-01 (×6): qty 1

## 2020-07-01 MED ORDER — ACETAMINOPHEN 650 MG RE SUPP: 650.0000 mg | Freq: Four times a day (QID) | RECTAL | Status: DC | PRN

## 2020-07-01 MED ORDER — SENNOSIDES-DOCUSATE SODIUM 8.6-50 MG PO TABS: 1.0000 | ORAL_TABLET | Freq: Every evening | ORAL | Status: DC | PRN

## 2020-07-01 MED ORDER — BRIMONIDINE TARTRATE-TIMOLOL 0.2-0.5 % OP SOLN: 1.0000 [drp] | Freq: Two times a day (BID) | OPHTHALMIC | Status: DC

## 2020-07-01 MED ORDER — LEVOTHYROXINE SODIUM 50 MCG PO TABS
50.0000 ug | ORAL_TABLET | Freq: Every day | ORAL | Status: DC
  Administered 2020-07-01 – 2020-07-06 (×6): 50 ug via ORAL
  Filled 2020-07-01 (×6): qty 1

## 2020-07-01 MED ORDER — INSULIN ASPART 100 UNIT/ML ~~LOC~~ SOLN
0.0000 [IU] | Freq: Three times a day (TID) | SUBCUTANEOUS | Status: DC
Start: 2020-07-01 — End: 2020-07-06
  Administered 2020-07-01: 8 [IU] via SUBCUTANEOUS
  Administered 2020-07-02: 3 [IU] via SUBCUTANEOUS
  Administered 2020-07-02: 5 [IU] via SUBCUTANEOUS
  Administered 2020-07-03: 3 [IU] via SUBCUTANEOUS
  Administered 2020-07-03: 5 [IU] via SUBCUTANEOUS
  Administered 2020-07-03 (×2): 2 [IU] via SUBCUTANEOUS
  Administered 2020-07-04: 3 [IU] via SUBCUTANEOUS
  Administered 2020-07-04: 5 [IU] via SUBCUTANEOUS
  Administered 2020-07-04 – 2020-07-05 (×4): 3 [IU] via SUBCUTANEOUS

## 2020-07-01 MED ORDER — OSELTAMIVIR PHOSPHATE 30 MG PO CAPS
30.0000 mg | ORAL_CAPSULE | Freq: Two times a day (BID) | ORAL | Status: DC
  Administered 2020-07-01 – 2020-07-04 (×7): 30 mg via ORAL
  Filled 2020-07-01 (×7): qty 1

## 2020-07-01 MED ORDER — GUAIFENESIN ER 600 MG PO TB12
600.0000 mg | ORAL_TABLET | Freq: Two times a day (BID) | ORAL | Status: DC
  Administered 2020-07-01 – 2020-07-06 (×11): 600 mg via ORAL
  Filled 2020-07-01 (×11): qty 1

## 2020-07-01 MED ORDER — ALBUTEROL SULFATE (2.5 MG/3ML) 0.083% IN NEBU: 2.5000 mg | INHALATION_SOLUTION | RESPIRATORY_TRACT | Status: DC | PRN

## 2020-07-01 MED ORDER — ACETAMINOPHEN 325 MG PO TABS: 650.0000 mg | ORAL_TABLET | Freq: Four times a day (QID) | ORAL | Status: DC | PRN

## 2020-07-01 MED ORDER — BRIMONIDINE TARTRATE 0.2 % OP SOLN
1.0000 [drp] | Freq: Two times a day (BID) | OPHTHALMIC | Status: DC
  Administered 2020-07-01 – 2020-07-06 (×11): 1 [drp] via OPHTHALMIC
  Filled 2020-07-01: qty 5

## 2020-07-01 MED ORDER — HYDRALAZINE HCL 20 MG/ML IJ SOLN
10.0000 mg | Freq: Four times a day (QID) | INTRAMUSCULAR | Status: DC | PRN
  Administered 2020-07-01 – 2020-07-02 (×3): 10 mg via INTRAVENOUS
  Filled 2020-07-01 (×2): qty 1

## 2020-07-01 MED ORDER — BENZONATATE 100 MG PO CAPS
200.0000 mg | ORAL_CAPSULE | Freq: Three times a day (TID) | ORAL | Status: DC
  Administered 2020-07-01 – 2020-07-06 (×16): 200 mg via ORAL
  Filled 2020-07-01 (×16): qty 2

## 2020-07-01 MED ORDER — DEXTROSE-NACL 5-0.9 % IV SOLN: INTRAVENOUS | Status: DC

## 2020-07-01 MED ORDER — APIXABAN 2.5 MG PO TABS
2.5000 mg | ORAL_TABLET | Freq: Two times a day (BID) | ORAL | Status: DC
  Administered 2020-07-01 – 2020-07-06 (×12): 2.5 mg via ORAL
  Filled 2020-07-01 (×13): qty 1

## 2020-07-01 MED ORDER — TIMOLOL MALEATE 0.5 % OP SOLN
1.0000 [drp] | Freq: Two times a day (BID) | OPHTHALMIC | Status: DC
  Administered 2020-07-01 – 2020-07-06 (×11): 1 [drp] via OPHTHALMIC
  Filled 2020-07-01: qty 5

## 2020-07-01 MED ORDER — ROSUVASTATIN CALCIUM 5 MG PO TABS
10.0000 mg | ORAL_TABLET | Freq: Every day | ORAL | Status: DC
  Administered 2020-07-01 – 2020-07-05 (×5): 10 mg via ORAL
  Filled 2020-07-01 (×5): qty 2

## 2020-07-01 NOTE — Care Management Obs Status (Signed)
[place] NOTIFICATION   Patient Details  Name: Diana [name] MRN: [id number] Date of Birth: 1934-02-19   Medicare Observation Status Notification Given:  Yes    Bethena Roys, RN 07/01/2020, 2:45 PM
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
## 2020-07-01 NOTE — ED Notes (Signed)
Report given to Darius Bump, RN of [phone]

## 2020-07-01 NOTE — Progress Notes (Signed)
PROGRESS NOTE  Diana [name]  DOB: April 10, 1933  PCP: Christain Sacramento, MD ZOX:[id number]  DOA: 06/30/2020  LOS: 0 days   Chief Complaint  Patient presents with  . Shortness of Breath   Brief narrative: Diana [name] is a 87 y.o. female with PMH significant for DM2, HTN, HLD, A. fib, hypothyroidism, CKD stage III, history of breast cancer. Patient was brought to the ED on 4/23 from [facility].  Patient had generalized weakness for last few days with couple of falls in 3 to 4 days.  She was seen in the ED 2 days prior on 4/21 for a fall, not found to have a fracture but she was positive for influenza A, and started on Tamiflu and discharged back.  Generalized weakness continued, appetite remains low..  In a visit by family on 4/23, she was noticed to have difficulty breathing in supine position with mild productive cough.  She was brought to the ED by EMS.  En route to the ED, patient vomited.  Zofran IM was given. In the ED, patient was hemodynamically stable, in rate controlled A. fib.  Blood sugar was low at 43 for which she was given oral glucose and dextrose IV with subsequent improvement in blood sugar level. CT of chest did not show any infiltrate or fractures of her ribs.  Labs are unremarkable. Kept in observation overnight for hypoglycemic episode and persistent flu symptoms  Subjective: Patient was seen and examined this morning.  Pleasant elderly Caucasian female.  Lying down in bed.  Confused.  Not restless or agitated. Chart reviewed Afebrile, blood pressure has been running elevated up to 180s overnight Lab this morning with sodium level low at 132.  Assessment/Plan: Influenza A positive - 4/21 -Patient reportedly started Tamiflu on 4/22.  Plan for 5-day course. -Symptomatic management with Mucinex, Tessalon Perles, Tylenol -Currently breathing on room air.  Hypoglycemic episode Type 2 diabetes mellitus -A1c 6.9 on 4/24 -Home meds include Lantus 7 units at  bedtime, glimepiride 2 mg twice daily, metformin 500 mg daily. -Currently all meds on hold. -Continue to monitor blood sugar. Recent Labs  Lab 06/30/20 1942 06/30/20 2029 07/01/20 0249 07/01/20 0402 07/01/20 1043  GLUCAP 38* 169* 84 78 811*   Acute metabolic encephalopathy -Unclear baseline mental status.  This morning, patient was calm, not agitated but not oriented to place or person. -Altered mental status likely because of fluid, hypoglycemia.  May have underlying dementia -Continue to monitor mental status change  Essential hypertension -Home meds include Metoprolol 12.5 mg twice daily, amlodipine 10 mg daily, losartan 50 mg twice daily. -Currently on metoprolol and losartan.  Amlodipine on hold.  Permanent atrial fibrillation  Stable heart rate on metoprolol..  Continue Eliquis for chronic anticoagulation  Generalized weakness Frequent falls -High risk of recurrence of falls.  Will have a conversation with family about the risk of continuation of long-term anticoagulation -PT evaluation pending  CKD stage IIIb -Creatinine remains at baseline Recent Labs    03/25/20 1038 03/26/20 0348 03/27/20 0528 03/28/20 0743 03/31/20 0000 04/02/20 0000 04/27/20 0640 06/28/20 1018 06/30/20 1814 07/01/20 0348  BUN 33* 38* 36* 33* 29* 23* 29* 24* 22  23 21   CREATININE 1.40* 1.77* 1.63* 1.68* 1.2* 1.1 1.65* 1.46* 1.25*  1.26* 1.21*    Home meds include  Metoprolol 12.5 mg twice daily, amlodipine 10 mg daily, losartan 50 mg twice daily, Eliquis 2.5 mg twice daily, Lantus 7 units at bedtime, glimepiride 2 mg twice daily, metformin 500 mg daily,  Crestor 10 mg daily Iron supplement, Protonix 40 mg twice daily, Prozac 20 mg daily, Synthroid 50 mcg daily, Norco as needed,   Mobility: PT eval pending Code Status:   Code Status: Full Code  Nutritional status: Body mass index is 30.64 kg/m.     Diet Order            Diet heart healthy/carb modified Room service  appropriate? Yes; Fluid consistency: Thin  Diet effective now                 DVT prophylaxis: apixaban (ELIQUIS) tablet 2.5 mg Start: 07/01/20 0315 apixaban (ELIQUIS) tablet 2.5 mg   Antimicrobials:  Tamiflu Fluid: D5 NS at 50 mill per hour Consultants: None Family Communication:  None at bedside  Status is: Inpatient  Remains inpatient appropriate because: Needs further monitoring because of hypoglycemic episode  Dispo: The patient is from: Independent living              Anticipated d/c is to: Hopefully back to normal living 1 to 2 days              Patient currently is not medically stable to d/c.   Difficult to place patient No     Infusions:  . dextrose 5 % and 0.9% NaCl 50 mL/hr at 07/01/20 0407    Scheduled Meds: . apixaban  2.5 mg Oral BID  . benzonatate  200 mg Oral TID  . brimonidine  1 drop Both Eyes Q12H  . FLUoxetine  20 mg Oral Daily  . guaiFENesin  600 mg Oral BID  . levothyroxine  50 mcg Oral Daily  . losartan  50 mg Oral BID  . metoprolol tartrate  12.5 mg Oral BID  . oseltamivir  30 mg Oral Q12H  . rosuvastatin  10 mg Oral QHS  . timolol  1 drop Both Eyes BID    Antimicrobials: Anti-infectives (From admission, onward)   Start     Dose/Rate Route Frequency Ordered Stop   07/01/20 1000  oseltamivir (TAMIFLU) capsule 30 mg        30 mg Oral Every 12 hours 07/01/20 0314 07/06/20 0959   06/30/20 1600  oseltamivir (TAMIFLU) capsule 30 mg        30 mg Oral  Once 06/30/20 1559 06/30/20 1842      PRN meds: acetaminophen **OR** acetaminophen, albuterol, hydrALAZINE, senna-docusate   Objective: Vitals:   07/01/20 1124 07/01/20 1200  BP: (!) 176/83 139/79  Pulse: 81 78  Resp: 20 17  Temp: 98.3 F (36.8 C)   SpO2: 97% 94%    Intake/Output Summary (Last 24 hours) at 07/01/2020 1330 Last data filed at 07/01/2020 0830 Gross per 24 hour  Intake --  Output 300 ml  Net -300 ml   Filed Weights   07/01/20 0612  Weight: 86.1 kg   Weight  change:  Body mass index is 30.64 kg/m.   Physical Exam: General exam: Pleasant elderly Caucasian female.  Not in physical distress Skin: No rashes, lesions or ulcers. HEENT: Atraumatic, normocephalic, no obvious bleeding Lungs: Clear to auscultation bilaterally CVS: Regular rate and rhythm, no murmur GI/Abd soft, nontender, nondistended, bowel present CNS: Alert, awake, not oriented to place, person or time.  Not restless or agitated Psychiatry: Mood appropriate Extremities: No pedal edema no calf tenderness  Data Review: I have personally reviewed the laboratory data and studies available.  Recent Labs  Lab 06/28/20 1018 06/30/20 1422 07/01/20 0348  WBC 9.8 6.6 3.8*  HGB 12.5 14.3  12.6  HCT 40.3 46.3* 40.2  MCV 93.9 95.1 92.2  PLT 189 173 182   Recent Labs  Lab 06/28/20 1018 06/30/20 1814 07/01/20 0348  NA 137 137  136 132*  K 4.4 4.0  4.2 4.1  CL 101 100  100 99  CO2 25 27  27 24   GLUCOSE 154* 43*  42* 74  BUN 24* 22  23 21   CREATININE 1.46* 1.25*  1.26* 1.21*  CALCIUM 8.5* 8.3*  8.1* 8.1*    F/u labs ordered Unresulted Labs (From admission, onward)          Start     Ordered   07/02/20 0500  CBC with Differential/Platelet  Tomorrow morning,   R       Question:  Specimen collection method  Answer:  Lab=Lab collect   07/01/20 1330   07/02/20 9563  Basic metabolic panel  Tomorrow morning,   R       Question:  Specimen collection method  Answer:  Lab=Lab collect   07/01/20 1330   07/02/20 0500  Magnesium  Tomorrow morning,   STAT       Question:  Specimen collection method  Answer:  Lab=Lab collect   07/01/20 1330   07/02/20 0500  Phosphorus  Tomorrow morning,   R       Question:  Specimen collection method  Answer:  Lab=Lab collect   07/01/20 1330          Signed, Terrilee Croak, MD [facility] 07/01/2020

## 2020-07-02 DIAGNOSIS — R4189 Other symptoms and signs involving cognitive functions and awareness: Secondary | ICD-10-CM | POA: Diagnosis present

## 2020-07-02 DIAGNOSIS — R531 Weakness: Secondary | ICD-10-CM | POA: Diagnosis present

## 2020-07-02 DIAGNOSIS — N1832 Chronic kidney disease, stage 3b: Secondary | ICD-10-CM | POA: Diagnosis present

## 2020-07-02 DIAGNOSIS — E1169 Type 2 diabetes mellitus with other specified complication: Secondary | ICD-10-CM | POA: Diagnosis present

## 2020-07-02 DIAGNOSIS — E11649 Type 2 diabetes mellitus with hypoglycemia without coma: Secondary | ICD-10-CM | POA: Diagnosis present

## 2020-07-02 DIAGNOSIS — E162 Hypoglycemia, unspecified: Secondary | ICD-10-CM | POA: Diagnosis present

## 2020-07-02 DIAGNOSIS — M1712 Unilateral primary osteoarthritis, left knee: Secondary | ICD-10-CM | POA: Diagnosis present

## 2020-07-02 DIAGNOSIS — Z20822 Contact with and (suspected) exposure to covid-19: Secondary | ICD-10-CM | POA: Diagnosis present

## 2020-07-02 DIAGNOSIS — I129 Hypertensive chronic kidney disease with stage 1 through stage 4 chronic kidney disease, or unspecified chronic kidney disease: Secondary | ICD-10-CM | POA: Diagnosis present

## 2020-07-02 DIAGNOSIS — E1122 Type 2 diabetes mellitus with diabetic chronic kidney disease: Secondary | ICD-10-CM | POA: Diagnosis present

## 2020-07-02 DIAGNOSIS — K219 Gastro-esophageal reflux disease without esophagitis: Secondary | ICD-10-CM | POA: Diagnosis present

## 2020-07-02 DIAGNOSIS — Z87891 Personal history of nicotine dependence: Secondary | ICD-10-CM | POA: Diagnosis not present

## 2020-07-02 DIAGNOSIS — E785 Hyperlipidemia, unspecified: Secondary | ICD-10-CM | POA: Diagnosis present

## 2020-07-02 DIAGNOSIS — R296 Repeated falls: Secondary | ICD-10-CM | POA: Diagnosis present

## 2020-07-02 DIAGNOSIS — I4821 Permanent atrial fibrillation: Secondary | ICD-10-CM | POA: Diagnosis present

## 2020-07-02 DIAGNOSIS — G9341 Metabolic encephalopathy: Secondary | ICD-10-CM | POA: Diagnosis not present

## 2020-07-02 DIAGNOSIS — E860 Dehydration: Secondary | ICD-10-CM | POA: Diagnosis present

## 2020-07-02 DIAGNOSIS — W19XXXA Unspecified fall, initial encounter: Secondary | ICD-10-CM | POA: Diagnosis present

## 2020-07-02 DIAGNOSIS — R0600 Dyspnea, unspecified: Secondary | ICD-10-CM | POA: Diagnosis present

## 2020-07-02 DIAGNOSIS — J101 Influenza due to other identified influenza virus with other respiratory manifestations: Secondary | ICD-10-CM | POA: Diagnosis present

## 2020-07-02 DIAGNOSIS — D631 Anemia in chronic kidney disease: Secondary | ICD-10-CM | POA: Diagnosis present

## 2020-07-02 DIAGNOSIS — K5900 Constipation, unspecified: Secondary | ICD-10-CM | POA: Diagnosis not present

## 2020-07-02 DIAGNOSIS — G4733 Obstructive sleep apnea (adult) (pediatric): Secondary | ICD-10-CM | POA: Diagnosis present

## 2020-07-02 DIAGNOSIS — E039 Hypothyroidism, unspecified: Secondary | ICD-10-CM | POA: Diagnosis present

## 2020-07-02 DIAGNOSIS — Z853 Personal history of malignant neoplasm of breast: Secondary | ICD-10-CM | POA: Diagnosis not present

## 2020-07-02 DIAGNOSIS — E669 Obesity, unspecified: Secondary | ICD-10-CM | POA: Diagnosis present

## 2020-07-02 LAB — BASIC METABOLIC PANEL
Anion gap: 8 (ref 5–15)
BUN: 28 mg/dL — ABNORMAL HIGH (ref 8–23)
CO2: 24 mmol/L (ref 22–32)
Calcium: 8 mg/dL — ABNORMAL LOW (ref 8.9–10.3)
Chloride: 102 mmol/L (ref 98–111)
Creatinine, Ser: 1.44 mg/dL — ABNORMAL HIGH (ref 0.44–1.00)
GFR, Estimated: 36 mL/min — ABNORMAL LOW (ref >60)
Glucose, Bld: 87 mg/dL (ref 70–99)
Potassium: 4.1 mmol/L (ref 3.5–5.1)
Sodium: 134 mmol/L — ABNORMAL LOW (ref 135–145)

## 2020-07-02 LAB — CBC WITH DIFFERENTIAL/PLATELET
Abs Immature Granulocytes: 0.02 10*3/uL (ref 0.00–0.07)
Basophils Absolute: 0 10*3/uL (ref 0.0–0.1)
Basophils Relative: 1 %
Eosinophils Absolute: 0 10*3/uL (ref 0.0–0.5)
Eosinophils Relative: 1 %
HCT: 36.2 % (ref 36.0–46.0)
Hemoglobin: 11.5 g/dL — ABNORMAL LOW (ref 12.0–15.0)
Immature Granulocytes: 1 %
Lymphocytes Relative: 19 %
Lymphs Abs: 0.8 10*3/uL (ref 0.7–4.0)
MCH: 29.2 pg (ref 26.0–34.0)
MCHC: 31.8 g/dL (ref 30.0–36.0)
MCV: 91.9 fL (ref 80.0–100.0)
Monocytes Absolute: 0.8 10*3/uL (ref 0.1–1.0)
Monocytes Relative: 20 %
Neutro Abs: 2.4 10*3/uL (ref 1.7–7.7)
Neutrophils Relative %: 58 %
Platelets: 195 10*3/uL (ref 150–400)
RBC: 3.94 MIL/uL (ref 3.87–5.11)
RDW: 14.4 % (ref 11.5–15.5)
WBC: 4 10*3/uL (ref 4.0–10.5)
nRBC: 0 % (ref 0.0–0.2)

## 2020-07-02 LAB — GLUCOSE, CAPILLARY
Glucose-Capillary: 57 mg/dL — ABNORMAL LOW (ref 70–99)
Glucose-Capillary: 122 mg/dL — ABNORMAL HIGH (ref 70–99)
Glucose-Capillary: 97 mg/dL (ref 70–99)
Glucose-Capillary: 224 mg/dL — ABNORMAL HIGH (ref 70–99)
Glucose-Capillary: 179 mg/dL — ABNORMAL HIGH (ref 70–99)

## 2020-07-02 LAB — MAGNESIUM: Magnesium: 1.7 mg/dL (ref 1.7–2.4)

## 2020-07-02 LAB — PHOSPHORUS: Phosphorus: 2.5 mg/dL (ref 2.5–4.6)

## 2020-07-02 MED ORDER — AMLODIPINE BESYLATE 10 MG PO TABS
10.0000 mg | ORAL_TABLET | Freq: Every day | ORAL | Status: DC
  Administered 2020-07-02 – 2020-07-06 (×5): 10 mg via ORAL
  Filled 2020-07-02 (×4): qty 1

## 2020-07-02 NOTE — Progress Notes (Signed)
PROGRESS NOTE  Diana [name]  DOB: 1933-09-16  PCP: Christain Sacramento, MD JIR:[id number]  DOA: 06/30/2020  LOS: 0 days   Chief Complaint  Patient presents with  . Shortness of Breath   Brief narrative: Diana [name] is a 87 y.o. female with PMH significant for DM2, HTN, HLD, A. fib, hypothyroidism, CKD stage III, history of breast cancer. Patient was brought to the ED on 4/23 from [facility].  Patient had generalized weakness for last few days with couple of falls in 3 to 4 days.  She was seen in the ED 2 days prior on 4/21 for a fall, not found to have a fracture but she was positive for influenza A, and started on Tamiflu and discharged back.  Generalized weakness continued, appetite remains low..  In a visit by family on 4/23, she was noticed to have difficulty breathing in supine position with mild productive cough.  She was brought to the ED by EMS.  En route to the ED, patient vomited.  Zofran IM was given. In the ED, patient was hemodynamically stable, in rate controlled A. fib.  Blood sugar was low at 43 for which she was given oral glucose and dextrose IV with subsequent improvement in blood sugar level. CT of chest did not show any infiltrate or fractures of her ribs.  Labs are unremarkable. Kept in observation overnight for hypoglycemic episode and persistent flu symptoms  Subjective: Patient was seen and examined this morning.  Pleasant elderly Caucasian female.  Lying down in bed.  Sleepy this morning.  Not interested to talk. No event overnight. Not on supplemental oxygen this morning  Assessment/Plan: Influenza A positive - 4/21 -Patient reportedly started taking Tamiflu on 4/23.  Plan is to continue for a 5-day course. -Symptomatic management with Mucinex, Tessalon Perles, Tylenol -Currently breathing on room air.  Hypoglycemic episode Type 2 diabetes mellitus -A1c 6.9 on 4/24 -Home meds include Lantus 7 units at bedtime, glimepiride 2 mg twice daily,  metformin 500 mg daily. -Currently all meds on hold. -Continue to monitor oral intake and blood sugar. Recent Labs  Lab 07/01/20 1617 07/01/20 2219 07/02/20 0607 07/02/20 0647 07/02/20 1159  GLUCAP 306* 251* 57* 122* 97   Acute metabolic encephalopathy -Unclear baseline mental status.  This morning, patient was calm, not agitated but not oriented to place or person. -Altered mental status likely because of fluid, hypoglycemia.  May have underlying dementia -Continue to monitor mental status change. -PT eval obtained.  Patient probably not a candidate for independent living anymore..  Essential hypertension -Blood pressure elevated to 170s this morning.  Home meds include Metoprolol 12.5 mg twice daily, amlodipine 10 mg daily, losartan 50 mg twice daily. -Currently on metoprolol and losartan.  Amlodipine on hold.  Resume amlodipine today.  Permanent atrial fibrillation  Stable heart rate on metoprolol. Continue Eliquis for chronic anticoagulation  Generalized weakness Frequent falls -High risk of recurrence of falls.  Will have a conversation with family about the risk of continuation of long-term anticoagulation -PT evaluation pending  CKD stage IIIb -Creatinine at baseline is less than 1.4.  In Last 24 hours of creatinine increased from 1.11 to 1.44. -Continue IV hydration. Recent Labs    03/26/20 0348 03/27/20 0528 03/28/20 0743 03/31/20 0000 04/02/20 0000 04/27/20 0640 06/28/20 1018 06/30/20 1814 07/01/20 0348 07/02/20 0109  BUN 38* 36* 33* 29* 23* 29* 24* 22  23 21  28*  CREATININE 1.77* 1.63* 1.68* 1.2* 1.1 1.65* 1.46* 1.25*  1.26* 1.21* 1.44*  Mobility: PT eval obtained Code Status:   Code Status: Full Code  Nutritional status: Body mass index is 30.64 kg/m.     Diet Order            Diet heart healthy/carb modified Room service appropriate? No; Fluid consistency: Thin  Diet effective now                 DVT prophylaxis: apixaban (ELIQUIS)  tablet 2.5 mg Start: 07/01/20 0315 apixaban (ELIQUIS) tablet 2.5 mg   Antimicrobials:  Tamiflu Fluid: D5 NS at 50 mill per hour Consultants: None Family Communication:  None at bedside  Status is: Inpatient  Remains inpatient appropriate because: Needs further hydration, further monitoring because of hypoglycemic episode  Dispo: The patient is from: Independent living              Anticipated d/c is to: Hopefully back to normal living 1 to 2 days              Patient currently is not medically stable to d/c.   Difficult to place patient No     Infusions:  . dextrose 5 % and 0.9% NaCl 50 mL/hr at 07/01/20 0407    Scheduled Meds: . amLODipine  10 mg Oral Daily  . apixaban  2.5 mg Oral BID  . benzonatate  200 mg Oral TID  . brimonidine  1 drop Both Eyes Q12H  . FLUoxetine  20 mg Oral Daily  . guaiFENesin  600 mg Oral BID  . insulin aspart  0-15 Units Subcutaneous TID AC & HS  . levothyroxine  50 mcg Oral Daily  . metoprolol tartrate  12.5 mg Oral BID  . oseltamivir  30 mg Oral Q12H  . rosuvastatin  10 mg Oral QHS  . timolol  1 drop Both Eyes BID    Antimicrobials: Anti-infectives (From admission, onward)   Start     Dose/Rate Route Frequency Ordered Stop   07/01/20 1000  oseltamivir (TAMIFLU) capsule 30 mg        30 mg Oral Every 12 hours 07/01/20 0314 07/06/20 0959   06/30/20 1600  oseltamivir (TAMIFLU) capsule 30 mg        30 mg Oral  Once 06/30/20 1559 06/30/20 1842      PRN meds: acetaminophen **OR** acetaminophen, albuterol, hydrALAZINE, senna-docusate   Objective: Vitals:   07/02/20 1115 07/02/20 1300  BP: (!) 176/85 (!) 170/88  Pulse: 66   Resp: 17   Temp: 98.3 F (36.8 C)   SpO2: 96%     Intake/Output Summary (Last 24 hours) at 07/02/2020 1354 Last data filed at 07/02/2020 0338 Gross per 24 hour  Intake --  Output 600 ml  Net -600 ml   Filed Weights   07/01/20 0612  Weight: 86.1 kg   Weight change:  Body mass index is 30.64 kg/m.    Physical Exam: General exam: Pleasant elderly Caucasian female.  Not in physical distress Skin: No rashes, lesions or ulcers. HEENT: Atraumatic, normocephalic, no obvious bleeding Lungs: Clear to auscultation bilaterally CVS: Regular rate and rhythm, no murmur GI/Abd soft, nontender, nondistended, bowel present CNS: Alert, awake, not oriented to place, person or time.  Not restless or agitated Psychiatry: Mood appropriate Extremities: No pedal edema no calf tenderness  Data Review: I have personally reviewed the laboratory data and studies available.  Recent Labs  Lab 06/28/20 1018 06/30/20 1422 07/01/20 0348 07/02/20 0109  WBC 9.8 6.6 3.8* 4.0  NEUTROABS  --   --   --  2.4  HGB 12.5 14.3 12.6 11.5*  HCT 40.3 46.3* 40.2 36.2  MCV 93.9 95.1 92.2 91.9  PLT 189 173 182 195   Recent Labs  Lab 06/28/20 1018 06/30/20 1814 07/01/20 0348 07/02/20 0109  NA 137 137  136 132* 134*  K 4.4 4.0  4.2 4.1 4.1  CL 101 100  100 99 102  CO2 25 27  27 24 24   GLUCOSE 154* 43*  42* 74 87  BUN 24* 22  23 21  28*  CREATININE 1.46* 1.25*  1.26* 1.21* 1.44*  CALCIUM 8.5* 8.3*  8.1* 8.1* 8.0*  MG  --   --   --  1.7  PHOS  --   --   --  2.5    F/u labs ordered Unresulted Labs (From admission, onward)         None      Signed, Terrilee Croak, MD [facility] 07/02/2020

## 2020-07-02 NOTE — Progress Notes (Signed)
Inpatient Diabetes Program Recommendations  AACE/ADA: New Consensus Statement on Inpatient Glycemic Control (2015)  Target Ranges:  Prepandial:   less than 140 mg/dL      Peak postprandial:   less than 180 mg/dL (1-2 hours)      Critically ill patients:  140 - 180 mg/dL   Lab Results  Component Value Date   GLUCAP 97 07/02/2020   HGBA1C 6.9 (H) 07/01/2020    Review of Glycemic Control Results for Diana [name], Diana [name] (MRN [id number]) as of 07/02/2020 12:53  Ref. Range 07/01/2020 16:17 07/01/2020 22:19 07/02/2020 06:07 07/02/2020 06:47 07/02/2020 11:59  Glucose-Capillary Latest Ref Range: 70 - 99 mg/dL 306 (H) 251 (H) Noovlog 8 units 57 (L) 122 (H) 97   Diabetes history: DM2 Outpatient Diabetes medications: Lantus 7 units QD, Amaryl 2 mg BID, Metformin 500 mg QD Current orders for Inpatient glycemic control: Novolog 0-15 units TID and HS  Inpatient Diabetes Program Recommendations:     Novolog 0-9 units TID and 0-5 units QHS.  Will continue to follow while inpatient.  Thank you, Reche Dixon, RN, BSN Diabetes Coordinator Inpatient Diabetes Program [phone] (team pager from 8a-5p)

## 2020-07-03 ENCOUNTER — Encounter: Payer: Self-pay | Admitting: Internal Medicine

## 2020-07-03 ENCOUNTER — Other Ambulatory Visit: Payer: Medicare PPO

## 2020-07-03 DIAGNOSIS — E162 Hypoglycemia, unspecified: Secondary | ICD-10-CM | POA: Diagnosis not present

## 2020-07-03 LAB — GLUCOSE, CAPILLARY
Glucose-Capillary: 155 mg/dL — ABNORMAL HIGH (ref 70–99)
Glucose-Capillary: 224 mg/dL — ABNORMAL HIGH (ref 70–99)
Glucose-Capillary: 126 mg/dL — ABNORMAL HIGH (ref 70–99)

## 2020-07-03 LAB — BASIC METABOLIC PANEL
Anion gap: 8 (ref 5–15)
BUN: 24 mg/dL — ABNORMAL HIGH (ref 8–23)
CO2: 22 mmol/L (ref 22–32)
Calcium: 8.1 mg/dL — ABNORMAL LOW (ref 8.9–10.3)
Chloride: 104 mmol/L (ref 98–111)
Creatinine, Ser: 1.27 mg/dL — ABNORMAL HIGH (ref 0.44–1.00)
GFR, Estimated: 42 mL/min — ABNORMAL LOW (ref >60)
Glucose, Bld: 134 mg/dL — ABNORMAL HIGH (ref 70–99)
Potassium: 3.9 mmol/L (ref 3.5–5.1)
Sodium: 134 mmol/L — ABNORMAL LOW (ref 135–145)

## 2020-07-03 MED ORDER — LOSARTAN POTASSIUM 50 MG PO TABS
50.0000 mg | ORAL_TABLET | Freq: Two times a day (BID) | ORAL | Status: DC
  Administered 2020-07-03 – 2020-07-06 (×7): 50 mg via ORAL
  Filled 2020-07-03 (×7): qty 1

## 2020-07-03 MED ORDER — INSULIN GLARGINE 100 UNIT/ML SOLOSTAR PEN: 5.0000 [IU] | PEN_INJECTOR | Freq: Every day | SUBCUTANEOUS | Status: DC

## 2020-07-03 MED ORDER — INSULIN GLARGINE 100 UNIT/ML ~~LOC~~ SOLN
5.0000 [IU] | Freq: Every day | SUBCUTANEOUS | Status: DC
  Administered 2020-07-03 – 2020-07-05 (×3): 5 [IU] via SUBCUTANEOUS
  Filled 2020-07-03 (×5): qty 0.05

## 2020-07-03 MED ORDER — GUAIFENESIN-DM 100-10 MG/5ML PO SYRP
5.0000 mL | ORAL_SOLUTION | ORAL | Status: DC | PRN
  Administered 2020-07-03: 5 mL via ORAL
  Filled 2020-07-03: qty 5

## 2020-07-03 MED ORDER — GLIMEPIRIDE 2 MG PO TABS
2.0000 mg | ORAL_TABLET | Freq: Two times a day (BID) | ORAL | Status: DC
  Administered 2020-07-03 – 2020-07-06 (×6): 2 mg via ORAL
  Filled 2020-07-03 (×8): qty 1

## 2020-07-03 NOTE — Progress Notes (Signed)
Physical Therapy Treatment Patient Details Name: Diana [name] MRN: [id number] DOB: [birth year] Today's Date: 07/03/2020    History of Present Illness 87 y.o. female presents to [facility] ED from [facility] on 06/30/2020 with SOB and cough. Pt also reports generalized weakness and multiple falls over the past few days. Pt found positive in ED for FluA 06/28/2020. Imgaging of chest is unremarkable in ED. Past medical history significant for atrial fibrillation, hypothyroidism, hypertension, diabetes mellitus type 2, chronic kidney disease stage III, history of breast cancer, hyperlipidemia.    PT Comments    Pt received in supine, agreeable to therapy session with encouragement and with good participation and tolerance for gait and transfer training. Pt requires increased time to initiate and perform mobility tasks and needs cues for safety with transfers with limited carryover of instruction; of note, pt self-reports recent short term memory loss. Pt continues to benefit from PT services to progress toward functional mobility goals. Continue to recommend home with HHPT if 24/7 supervision/assist available upon discharge.  Follow Up Recommendations  Home health PT;Supervision/Assistance - 24 hour;Other (comment) (pt would likely benefit from transition from independent living to assisted living, 24/7 supervision due to cognitive deficits and falls history)     Equipment Recommendations  Rolling walker with 5" wheels;Other (comment) (will benefit from RW if pt does not have access to one at [employer], pt does report intermittent use of RW so may own one already)    Recommendations for Other Services       Precautions / Restrictions Precautions Precautions: Fall Precaution Comments: urinary incontinence, bring briefs Restrictions Weight Bearing Restrictions: No    Mobility  Bed Mobility Overal bed mobility: Needs Assistance Bed Mobility: Supine to Sit;Sit to Supine     Supine to sit:  Supervision Sit to supine: Supervision   General bed mobility comments: heavy use of bed features and increased time to get to EOB, needs some vcs/tcs    Transfers Overall transfer level: Needs assistance Equipment used: Rolling walker (2 wheeled) Transfers: Sit to/from Stand Sit to Stand: Min assist         General transfer comment: from EOB, needs cues for hand placement and multiple attempts to stand each time (x3 successful trials)  Ambulation/Gait Ambulation/Gait assistance: Min guard Gait Distance (Feet): 12 Feet (x2 with seated break between) Assistive device: Rolling walker (2 wheeled) Gait Pattern/deviations: Step-to pattern Gait velocity: reduced   General Gait Details: pt with short step-to gait, increased trunk flexion over RW, needs cues for proximity to device   Stairs             Wheelchair Mobility    Modified Rankin (Stroke Patients Only)       Balance Overall balance assessment: Needs assistance Sitting-balance support: No upper extremity supported;Feet supported Sitting balance-Leahy Scale: Fair     Standing balance support: No upper extremity supported Standing balance-Leahy Scale: Poor Standing balance comment: pt reliant on RW this date due to fatigue                            Cognition Arousal/Alertness: Awake/alert Behavior During Therapy: Anxious Overall Cognitive Status: History of cognitive impairments - at baseline                                 General Comments: Pt with some awareness of her deficits but does have a history of falls and  she states she will be able to hold her bladder to walk to bathroom but upon standing has to urinate (she had purewick in). Pt with noted short term memory deficit but does report she was arranging a different living situation (assisted living?) but hadn't moved there yet and she was unable to give specifics.      Exercises Other Exercises Other Exercises: supine  BLE AROM: ankle pumps, GS, QS x5-10 reps ea    General Comments General comments (skin integrity, edema, etc.): SpO2 98% on RA with exertion, HR 60's resting and 70's with mobility; Pt denies dizziness and BP not further assessed      Pertinent Vitals/Pain Pain Assessment: No/denies pain Faces Pain Scale: No hurt Pain Intervention(s): Monitored during session;Repositioned           PT Goals (current goals can now be found in the care plan section) Acute Rehab PT Goals Patient Stated Goal: PT goal to improve balance and reduce falls risk PT Goal Formulation: With patient Time For Goal Achievement: 07/15/20 Potential to Achieve Goals: Fair Progress towards PT goals: Progressing toward goals    Frequency    Min 3X/week      PT Plan Current plan remains appropriate       AM-PAC PT "6 Clicks" Mobility   Outcome Measure  Help needed turning from your back to your side while in a flat bed without using bedrails?: None Help needed moving from lying on your back to sitting on the side of a flat bed without using bedrails?: A Little Help needed moving to and from a bed to a chair (including a wheelchair)?: A Little Help needed standing up from a chair using your arms (e.g., wheelchair or bedside chair)?: A Little Help needed to walk in hospital room?: A Little Help needed climbing 3-5 steps with a railing? : A Lot 6 Click Score: 18    End of Session Equipment Utilized During Treatment: Gait belt Activity Tolerance: Patient tolerated treatment well;Other (comment) (quick to fatigue) Patient left: in bed;with call bell/phone within reach;with bed alarm set;Other (comment) (bed in chair position) Nurse Communication: Mobility status PT Visit Diagnosis: Unsteadiness on feet (R26.81);History of falling (Z91.81)     Time: 6294-7654 PT Time Calculation (min) (ACUTE ONLY): 24 min  Charges:  $Gait Training: 8-22 mins $Therapeutic Activity: 8-22 mins                     Javanna Patin  P., PTA Acute Rehabilitation Services Pager: [phone] Office: [place] 07/03/2020, 11:14 AM

## 2020-07-03 NOTE — Progress Notes (Signed)
PROGRESS NOTE  Diana [name]  DOB: 1933-12-18  PCP: Christain [place], MD UDJ:[id number]  DOA: 06/30/2020  LOS: 1 day   Chief Complaint  Patient presents with  . Shortness of Breath   Brief narrative: Diana [name] is a 87 y.o. female with PMH significant for DM2, HTN, HLD, A. fib, hypothyroidism, CKD stage III, history of breast cancer. Patient was brought to the ED on 4/23 from [facility].  Patient had generalized weakness for last few days with couple of falls in 3 to 4 days.  She was seen in the ED 2 days prior on 4/21 for a fall, not found to have a fracture but she was positive for influenza A, and started on Tamiflu and discharged back.  Generalized weakness continued, appetite remains low..  In a visit by family on 4/23, she was noticed to have difficulty breathing in supine position with mild productive cough.  She was brought to the ED by EMS.  En route to the ED, patient vomited.  Zofran IM was given. In the ED, patient was hemodynamically stable, in rate controlled A. fib.  Blood sugar was low at 43 for which she was given oral glucose and dextrose IV with subsequent improvement in blood sugar level. CT of chest did not show any infiltrate or fractures of her ribs.  Labs are unremarkable. Kept in observation overnight for hypoglycemic episode and persistent flu symptoms  Subjective: Patient was seen and examined this morning. Pleasant elderly Caucasian female.   Awake, alert, more conversational today.   Not on supplemental oxygen.  Assessment/Plan: Influenza A positive - 4/21 -Patient reportedly started taking Tamiflu on 4/23.  Plan is to continue for a 5-day course. -Symptomatic management with Mucinex, Tessalon Perles, Tylenol -Currently breathing on room air.  Hypoglycemic episode Type 2 diabetes mellitus -A1c 6.9 on 4/24 -Home meds include Lantus 7 units at bedtime, glimepiride 2 mg twice daily, metformin 500 mg daily. -Currently all meds on hold.   Blood sugar is trending up over 200 now. We will start Lantus 5 units and glimepiride 2 mg daily from tonight.  Keep metformin on hold. Recent Labs  Lab 07/02/20 1159 07/02/20 1625 07/02/20 2125 07/03/20 0611 07/03/20 1134  GLUCAP 97 224* 179* 155* 588*   Acute metabolic encephalopathy -Unclear baseline mental status.  On admission, patient was altered probably because of fluid and hypoglycemia.  Mental status is gradually improving now.   Impaired mobility -Seen by PT. SNF recommended.  Essential hypertension -Home meds include Metoprolol 12.5 mg twice daily, amlodipine 10 mg daily, losartan 50 mg twice daily. -Continue all.  Continue to monitor blood pressure.  Permanent atrial fibrillation  Stable heart rate on metoprolol. Continue Eliquis for chronic anticoagulation  Generalized weakness Frequent falls -PT evaluation obtained.  Patient is not strong enough to return back to independent living.  May need placement.  CKD stage IIIb -Creatinine at baseline is less than 1.4.  In Last 24 hours of creatinine increased from 1.11 to 1.44. -Continue IV hydration. Recent Labs    03/27/20 0528 03/28/20 0743 03/31/20 0000 04/02/20 0000 04/27/20 0640 06/28/20 1018 06/30/20 1814 07/01/20 0348 07/02/20 0109 07/03/20 0810  BUN 36* 33* 29* 23* 29* 24* 22  23 21  28* 24*  CREATININE 1.63* 1.68* 1.2* 1.1 1.65* 1.46* 1.25*  1.26* 1.21* 1.44* 1.27*   Mobility: PT eval obtained Code Status:   Code Status: Full Code  Nutritional status: Body mass index is 30.64 kg/m.     Diet Order  Diet heart healthy/carb modified Room service appropriate? No; Fluid consistency: Thin  Diet effective now                 DVT prophylaxis: apixaban (ELIQUIS) tablet 2.5 mg Start: 07/01/20 0315 apixaban (ELIQUIS) tablet 2.5 mg   Antimicrobials:  Tamiflu Fluid: Stop IV fluid   Consultants: None Family Communication:  None at bedside  Status is: Inpatient  Remains inpatient  appropriate because: Needs placement Dispo: The patient is from: Independent living              Anticipated d/c is to: ALF versus SNF              Patient currently is medically stable to d/c.   Difficult to place patient No     Infusions:    Scheduled Meds: . amLODipine  10 mg Oral Daily  . apixaban  2.5 mg Oral BID  . benzonatate  200 mg Oral TID  . brimonidine  1 drop Both Eyes Q12H  . FLUoxetine  20 mg Oral Daily  . glimepiride  2 mg Oral BID  . guaiFENesin  600 mg Oral BID  . insulin aspart  0-15 Units Subcutaneous TID AC & HS  . insulin glargine  5 Units Subcutaneous QHS  . levothyroxine  50 mcg Oral Daily  . losartan  50 mg Oral BID  . metoprolol tartrate  12.5 mg Oral BID  . oseltamivir  30 mg Oral Q12H  . rosuvastatin  10 mg Oral QHS  . timolol  1 drop Both Eyes BID    Antimicrobials: Anti-infectives (From admission, onward)   Start     Dose/Rate Route Frequency Ordered Stop   07/01/20 1000  oseltamivir (TAMIFLU) capsule 30 mg        30 mg Oral Every 12 hours 07/01/20 0314 07/06/20 0959   06/30/20 1600  oseltamivir (TAMIFLU) capsule 30 mg        30 mg Oral  Once 06/30/20 1559 06/30/20 1842      PRN meds: acetaminophen **OR** acetaminophen, albuterol, hydrALAZINE, senna-docusate   Objective: Vitals:   07/03/20 1135 07/03/20 1256  BP: (!) 98/52 120/68  Pulse: 60 65  Resp: 14 19  Temp: 97.6 F (36.4 C)   SpO2: 97% 99%    Intake/Output Summary (Last 24 hours) at 07/03/2020 1432 Last data filed at 07/02/2020 1816 Gross per 24 hour  Intake 600 ml  Output 150 ml  Net 450 ml   Filed Weights   07/01/20 0612  Weight: 86.1 kg   Weight change:  Body mass index is 30.64 kg/m.   Physical Exam: General exam: Pleasant elderly Caucasian female. Not in physical distress Skin: No rashes, lesions or ulcers. HEENT: Atraumatic, normocephalic, no obvious bleeding Lungs: Clear to auscultation bilaterally CVS: Regular rate and rhythm, no murmur GI/Abd soft,  nontender, nondistended, bowel sound present CNS: Alert, awake, oriented to place, person, not restless or agitated. Psychiatry: Mood appropriate Extremities: No pedal edema no calf tenderness  Data Review: I have personally reviewed the laboratory data and studies available.  Recent Labs  Lab 06/28/20 1018 06/30/20 1422 07/01/20 0348 07/02/20 0109  WBC 9.8 6.6 3.8* 4.0  NEUTROABS  --   --   --  2.4  HGB 12.5 14.3 12.6 11.5*  HCT 40.3 46.3* 40.2 36.2  MCV 93.9 95.1 92.2 91.9  PLT 189 173 182 195   Recent Labs  Lab 06/28/20 1018 06/30/20 1814 07/01/20 0348 07/02/20 0109 07/03/20 0810  NA 137 137  136 132* 134* 134*  K 4.4 4.0  4.2 4.1 4.1 3.9  CL 101 100  100 99 102 104  CO2 25 27  27 24 24 22   GLUCOSE 154* 43*  42* 74 87 134*  BUN 24* 22  23 21  28* 24*  CREATININE 1.46* 1.25*  1.26* 1.21* 1.44* 1.27*  CALCIUM 8.5* 8.3*  8.1* 8.1* 8.0* 8.1*  MG  --   --   --  1.7  --   PHOS  --   --   --  2.5  --     F/u labs ordered Unresulted Labs (From admission, onward)         None      Signed, Terrilee Croak, MD [facility] 07/03/2020

## 2020-07-04 DIAGNOSIS — E162 Hypoglycemia, unspecified: Secondary | ICD-10-CM | POA: Diagnosis not present

## 2020-07-04 LAB — GLUCOSE, CAPILLARY
Glucose-Capillary: 142 mg/dL — ABNORMAL HIGH (ref 70–99)
Glucose-Capillary: 96 mg/dL (ref 70–99)
Glucose-Capillary: 209 mg/dL — ABNORMAL HIGH (ref 70–99)
Glucose-Capillary: 156 mg/dL — ABNORMAL HIGH (ref 70–99)
Glucose-Capillary: 163 mg/dL — ABNORMAL HIGH (ref 70–99)

## 2020-07-04 NOTE — Progress Notes (Signed)
PROGRESS NOTE    Diana [name]  RJJ:[id number] DOB: 1934-02-24 DOA: 06/30/2020 PCP: Christain Sacramento, MD    Brief Narrative:  Diana [name] is a 87 year old female with past medical history significant for type 2 diabetes mellitus, essential hypertension, hyperlipidemia, permanent atrial fibrillation, CKD stage IIIb, history of breast cancer who was brought to the ED on 4/23 2022 from [facility] independent living with generalized weakness associated with a few falls over the past 3-4 days.  Patient was recently seen in the ED 2 days prior, diagnosed with influenza A and started on Tamiflu, subsequently discharged back home.  She continued with generalized weakness, poor appetite and was found to have difficulty breathing in supine position with mild productive cough.  In the ED, patient was hemodynamically stable, was noted to be in rate controlled A. fib with blood sugar low at 43.  Patient was given oral glucose and IV dextrose.  CT chest negative for infiltrate or rib fractures.  Labs unremarkable.  Given her significant debility from her baseline with generalized weakness, multiple falls in the setting of dyspnea secondary to acute influenza A, hospitalist service was consulted for further evaluation and management.   Assessment & Plan:   Principal Problem:   Hypoglycemia Active Problems:   HTN (hypertension)   Persistent atrial fibrillation (HCC)   Generalized weakness   CKD (chronic kidney disease) stage 3, GFR 30-59 ml/min (HCC)   Dyspnea   Influenza A   Influenza A Patient representing to the hospital with generalized weakness, dyspnea.  CT chest unrevealing.  Diagnosed with influenza A on 06/28/2020. --Tamiflu, complete 5-day course on 07/04/2020 --Mucinex, Tessalon Perles, Tylenol as needed  Type 2 diabetes mellitus with hypoglycemia Patient noted to have blood sugar 43 on admission, treated with oral glucose and IV dextrose.  Home medication include Lantus 7  units nightly, glimepiride 2 mg p.o. twice daily, metformin 500 mg p.o. daily.  Hypoglycemia now resolved. --Holding home metformin --Lantus 5 [place] QHS --Glimepride 2 mg p.o. BID --SSI for further coverage --CBG qAC/HS  Permanent atrial fibrillation --Metoprolol tartrate 12.5 mg p.o. twice daily --Eliquis 2.5 mg p.o. twice daily  Essential hypertension --Amlodipine 10 mg p.o. daily --Metoprolol tartrate 12.5 mg p.o. twice daily --Losartan 50 mg p.o. twice daily  CKD stage IIIb Stable.  Cognitive impairment Patient with likely mild underlying dementia.  Alert and oriented to person/place but not time.  Likely would benefit from short-term rehab for deconditioning and weakness followed by likely need for at least assisted living as she is unlikely to be able to manage her home medications given her cognitive impairment.  Generalized weakness Frequent falls Patient currently resident at [facility] independent living, now with generalized weakness and confusion.  Likely would benefit from short-term rehab before returning to independent living versus need of assisted living. --Continue PT/OT efforts while inpatient --SW for SNF placement   DVT prophylaxis: Eliquis   Code Status: Full Code Family Communication: No family present at bedside this morning.  Disposition Plan:  Level of care: Telemetry Medical Status is: Inpatient  Remains inpatient appropriate because:Unsafe d/c plan   Dispo: The patient is from: [facility]              Anticipated d/c is to: SNF              Patient currently is medically stable to d/c.   Difficult to place patient No    Consultants:   none  Procedures:   none  Antimicrobials:  Tamiflu   Subjective: Patient seen and examined at bedside, resting comfortably.  Continues with weakness and overall global fatigue.  Dyspnea improved.  Completing Tamiflu today.  Awaiting SNF placement, discussed with social work.  No other questions  or concerns at this time.  Denies headache, no chest pain, no palpitations, no shortness of breath, no dizziness, no cough/congestion, no fever/chills/night sweats, no nausea/vomiting/diarrhea, no abdominal pain.  No acute concerns overnight per nursing staff.  Objective: Vitals:   07/03/20 1710 07/03/20 1940 07/03/20 2345 07/04/20 0339  BP: (!) 150/92 (!) 159/77 (!) 91/55 114/71  Pulse: 100 74 60 60  Resp: 20 17 18 16   Temp: 97.7 F (36.5 C) 98.6 F (37 C) 97.6 F (36.4 C) 98.4 F (36.9 C)  TempSrc: Oral Oral Oral Oral  SpO2: 99% 97% 97% 95%  Weight:      Height:        Intake/Output Summary (Last 24 hours) at 07/04/2020 1057 Last data filed at 07/03/2020 2152 Gross per 24 hour  Intake 150 ml  Output 400 ml  Net -250 ml   Filed Weights   07/01/20 0612  Weight: 86.1 kg    Examination:  General exam: Appears calm and comfortable, chronically ill in appearance, pleasantly confused Respiratory system: Clear to auscultation. Respiratory effort normal.  On room air Cardiovascular system: S1 & S2 heard, irregularly irregular rhythm, normal rate. No JVD, murmurs, rubs, gallops or clicks. No pedal edema. Gastrointestinal system: Abdomen is nondistended, soft and nontender. No organomegaly or masses felt. Normal bowel sounds heard. Central nervous system: Alert to person ([place]), [facility]), but not time (2002). No focal neurological deficits. Extremities: Moves all extremities independently, power preserved, no lower extremity edema Skin: No rashes, lesions or ulcers Psychiatry: Judgement and insight appear poor. Mood & affect appropriate.     Data Reviewed: I have personally reviewed following labs and imaging studies  CBC: Recent Labs  Lab 06/28/20 1018 06/30/20 1422 07/01/20 0348 07/02/20 0109  WBC 9.8 6.6 3.8* 4.0  NEUTROABS  --   --   --  2.4  HGB 12.5 14.3 12.6 11.5*  HCT 40.3 46.3* 40.2 36.2  MCV 93.9 95.1 92.2 91.9  PLT 189 173 182 0000000   Basic Metabolic  Panel: Recent Labs  Lab 06/28/20 1018 06/30/20 1814 07/01/20 0348 07/02/20 0109 07/03/20 0810  NA 137 137  136 132* 134* 134*  K 4.4 4.0  4.2 4.1 4.1 3.9  CL 101 100  100 99 102 104  CO2 25 27  27 24 24 22   GLUCOSE 154* 43*  42* 74 87 134*  BUN 24* 22  23 21  28* 24*  CREATININE 1.46* 1.25*  1.26* 1.21* 1.44* 1.27*  CALCIUM 8.5* 8.3*  8.1* 8.1* 8.0* 8.1*  MG  --   --   --  1.7  --   PHOS  --   --   --  2.5  --    GFR: Estimated Creatinine Clearance: 36.4 mL/min (A) (by C-G formula based on SCr of 1.27 mg/dL (H)). Liver Function Tests: Recent Labs  Lab 06/28/20 1018 06/30/20 1814  AST 19 28  ALT 13 15  ALKPHOS 89 86  BILITOT 1.0 0.8  PROT 6.2* 6.4*  ALBUMIN 3.1* 3.0*   No results for input(s): LIPASE, AMYLASE in the last 168 hours. No results for input(s): AMMONIA in the last 168 hours. Coagulation Profile: No results for input(s): INR, PROTIME in the last 168 hours. Cardiac Enzymes: Recent Labs  Lab 06/30/20 1814  CKTOTAL 71   BNP (last 3 results) No results for input(s): PROBNP in the last 8760 hours. HbA1C: No results for input(s): HGBA1C in the last 72 hours. CBG: Recent Labs  Lab 07/03/20 0611 07/03/20 1134 07/03/20 1715 07/03/20 2136 07/04/20 0630  GLUCAP 155* 224* 126* 142* 96   Lipid Profile: No results for input(s): CHOL, HDL, LDLCALC, TRIG, CHOLHDL, LDLDIRECT in the last 72 hours. Thyroid Function Tests: No results for input(s): TSH, T4TOTAL, FREET4, T3FREE, THYROIDAB in the last 72 hours. Anemia Panel: No results for input(s): VITAMINB12, FOLATE, FERRITIN, TIBC, IRON, RETICCTPCT in the last 72 hours. Sepsis Labs: No results for input(s): PROCALCITON, LATICACIDVEN in the last 168 hours.  Recent Results (from the past 240 hour(s))  Urine Culture     Status: None   Collection Time: 06/28/20  9:30 AM   Specimen: Urine, Random  Result Value Ref Range Status   Specimen Description URINE, RANDOM  Final   Special Requests NONE  Final    Culture   Final    NO GROWTH Performed at [facility], [address], [place], [place] [address]    Report Status 06/29/2020 FINAL  Final  Resp Panel by RT-PCR (Flu A&B, Covid) Nasopharyngeal Swab     Status: Abnormal   Collection Time: 06/28/20  9:33 AM   Specimen: Nasopharyngeal Swab; Nasopharyngeal(NP) swabs in vial transport medium  Result Value Ref Range Status   SARS Coronavirus 2 by RT PCR NEGATIVE NEGATIVE Final    Comment: (NOTE) SARS-CoV-2 target nucleic acids are NOT DETECTED.  The SARS-CoV-2 RNA is generally detectable in upper respiratory specimens during the acute phase of infection. The lowest concentration of SARS-CoV-2 viral copies this assay can detect is 138 copies/mL. A negative result does not preclude SARS-Cov-2 infection and should not be used as the sole basis for treatment or other patient management decisions. A negative result may occur with  improper specimen collection/handling, submission of specimen other than nasopharyngeal swab, presence of viral mutation(s) within the areas targeted by this assay, and inadequate number of viral copies(<138 copies/mL). A negative result must be combined with clinical observations, patient history, and epidemiological information. The expected result is Negative.  Fact Sheet for Patients:  EntrepreneurPulse.com.au  Fact Sheet for Healthcare Providers:  IncredibleEmployment.be  This test is no t yet approved or cleared by the [place] FDA and  has been authorized for detection and/or diagnosis of SARS-CoV-2 by FDA under an Emergency Use Authorization (EUA). This EUA will remain  in effect (meaning this test can be used) for the duration of the COVID-19 declaration under Section 564(b)(1) of the Act, 21 U.S.C.section 360bbb-3(b)(1), unless the authorization is terminated  or revoked sooner.       Influenza A by PCR POSITIVE (A) NEGATIVE Final   Influenza B by PCR  NEGATIVE NEGATIVE Final    Comment: (NOTE) The Xpert Xpress SARS-CoV-2/FLU/RSV plus assay is intended as an aid in the diagnosis of influenza from Nasopharyngeal swab specimens and should not be used as a sole basis for treatment. Nasal washings and aspirates are unacceptable for Xpert Xpress SARS-CoV-2/FLU/RSV testing.  Fact Sheet for Patients: EntrepreneurPulse.com.au  Fact Sheet for Healthcare Providers: IncredibleEmployment.be  This test is not yet approved or cleared by the [place] FDA and has been authorized for detection and/or diagnosis of SARS-CoV-2 by FDA under an Emergency Use Authorization (EUA). This EUA will remain in effect (meaning this test can be used) for the duration of the COVID-19 declaration under Section 564(b)(1)  of the Act, 21 U.S.C. section 360bbb-3(b)(1), unless the authorization is terminated or revoked.  Performed at [facility], [place] [address], [place], [place] [address]   Resp Panel by RT-PCR (Flu A&B, Covid) Nasopharyngeal Swab     Status: Abnormal   Collection Time: 06/30/20 11:59 PM   Specimen: Nasopharyngeal Swab; Nasopharyngeal(NP) swabs in vial transport medium  Result Value Ref Range Status   SARS Coronavirus 2 by RT PCR NEGATIVE NEGATIVE Final    Comment: (NOTE) SARS-CoV-2 target nucleic acids are NOT DETECTED.  The SARS-CoV-2 RNA is generally detectable in upper respiratory specimens during the acute phase of infection. The lowest concentration of SARS-CoV-2 viral copies this assay can detect is 138 copies/mL. A negative result does not preclude SARS-Cov-2 infection and should not be used as the sole basis for treatment or other patient management decisions. A negative result may occur with  improper specimen collection/handling, submission of specimen other than nasopharyngeal swab, presence of viral mutation(s) within the areas targeted by this assay, and inadequate number of  viral copies(<138 copies/mL). A negative result must be combined with clinical observations, patient history, and epidemiological information. The expected result is Negative.  Fact Sheet for Patients:  EntrepreneurPulse.com.au  Fact Sheet for Healthcare Providers:  IncredibleEmployment.be  This test is no t yet approved or cleared by the [place] FDA and  has been authorized for detection and/or diagnosis of SARS-CoV-2 by FDA under an Emergency Use Authorization (EUA). This EUA will remain  in effect (meaning this test can be used) for the duration of the COVID-19 declaration under Section 564(b)(1) of the Act, 21 U.S.C.section 360bbb-3(b)(1), unless the authorization is terminated  or revoked sooner.       Influenza A by PCR POSITIVE (A) NEGATIVE Final   Influenza B by PCR NEGATIVE NEGATIVE Final    Comment: (NOTE) The Xpert Xpress SARS-CoV-2/FLU/RSV plus assay is intended as an aid in the diagnosis of influenza from Nasopharyngeal swab specimens and should not be used as a sole basis for treatment. Nasal washings and aspirates are unacceptable for Xpert Xpress SARS-CoV-2/FLU/RSV testing.  Fact Sheet for Patients: EntrepreneurPulse.com.au  Fact Sheet for Healthcare Providers: IncredibleEmployment.be  This test is not yet approved or cleared by the [place] FDA and has been authorized for detection and/or diagnosis of SARS-CoV-2 by FDA under an Emergency Use Authorization (EUA). This EUA will remain in effect (meaning this test can be used) for the duration of the COVID-19 declaration under Section 564(b)(1) of the Act, 21 U.S.C. section 360bbb-3(b)(1), unless the authorization is terminated or revoked.  Performed at [facility], [place] [address] [place], [place] [address]          Radiology Studies: No results found.      Scheduled Meds: . amLODipine  10 mg Oral Daily   . apixaban  2.5 mg Oral BID  . benzonatate  200 mg Oral TID  . brimonidine  1 drop Both Eyes Q12H  . FLUoxetine  20 mg Oral Daily  . glimepiride  2 mg Oral BID  . guaiFENesin  600 mg Oral BID  . insulin aspart  0-15 Units Subcutaneous TID AC & HS  . insulin glargine  5 Units Subcutaneous QHS  . levothyroxine  50 mcg Oral Daily  . losartan  50 mg Oral BID  . metoprolol tartrate  12.5 mg Oral BID  . oseltamivir  30 mg Oral Q12H  . rosuvastatin  10 mg Oral QHS  . timolol  1 drop  Both Eyes BID   Continuous Infusions:   LOS: 2 days    Time spent: 39 minutes spent on chart review, discussion with nursing staff, consultants, updating family and interview/physical exam; more than 50% of that time was spent in counseling and/or coordination of care.    Breah Joa J [place] DO [facility] Available via Epic secure chat 7am-7pm After these hours, please refer to coverage provider listed on [email or web address] 07/04/2020, 10:57 AM

## 2020-07-04 NOTE — Progress Notes (Signed)
This encounter was created in error - please disregard.

## 2020-07-04 NOTE — Progress Notes (Signed)
Physical Therapy Treatment Patient Details Name: Diana [name] MRN: [id number] DOB: [birth year] Today's Date: 07/04/2020    History of Present Illness 87 y.o. female [facility] ED from ILF on 06/30/2020 with SOB and cough. Pt also reports generalized weakness and multiple falls over the past few days. Pt found positive in ED for FluA 06/28/2020. Imgaging of chest is unremarkable in ED. Past medical history significant for atrial fibrillation, hypothyroidism, hypertension, diabetes mellitus type 2, chronic kidney disease stage III, history of breast cancer, hyperlipidemia.    PT Comments    Pt received in chair, agreeable to therapy session and with good participation and tolerance for seated/supine exercises and transfer training. Pt limited due to urinary incontinence despite purewick and mesh briefs donned but able to assist with LE cleanup and reaching outside BOS ~6-8" without LOB and supervision while seated. She needs min guard at most for static standing and transfers. Pt continues to benefit from PT services to progress toward functional mobility goals. Continue to recommend HHPT, will plan to bring briefs and progress gait distance next session.    Follow Up Recommendations  Home health PT;Supervision/Assistance - 24 hour;Other (comment) (pt would likely benefit from transition from independent living to assisted living, 24/7 supervision due to cognitive deficits and falls history)     Equipment Recommendations  Rolling walker with 5" wheels;Other (comment) (will benefit from RW if pt does not have access to one at [facility], pt does report intermittent use of RW so may own one already)    Recommendations for Other Services       Precautions / Restrictions Precautions Precautions: Fall Precaution Comments: urinary incontinence Restrictions Weight Bearing Restrictions: No    Mobility  Bed Mobility Overal bed mobility: Needs Assistance Bed Mobility: Supine to Sit      Supine to sit: Supervision     General bed mobility comments: pt received/remained in chair    Transfers Overall transfer level: Needs assistance Equipment used: Rolling walker (2 wheeled) Transfers: Sit to/from [facility] Sit to Stand: Min guard Stand pivot transfers: Min guard       General transfer comment: x3 reps from chair  Ambulation/Gait             General Gait Details: UTA, pt incontinent despite purewick and mesh briefs/pad and needed cleanup x3 times then fatigued after seated/reclined/standing exercises so deferred   Stairs             Wheelchair Mobility    Modified Rankin (Stroke Patients Only)       Balance Overall balance assessment: Needs assistance Sitting-balance support: No upper extremity supported;Feet supported Sitting balance-Leahy Scale: Good     Standing balance support: Bilateral upper extremity supported Standing balance-Leahy Scale: Poor Standing balance comment: pt reliant on RW                            Cognition Arousal/Alertness: Awake/alert Behavior During Therapy: Flat affect Overall Cognitive Status: History of cognitive impairments - at baseline                                 General Comments: Pt with some awareness of her deficits but does have a history of falls and she states she will be able to hold her bladder to walk but upon standing has to urinate (she had purewick in). Pt with noted short term memory  deficit and per her caretaker she just transitioned to ILF recently, may have difficulty arranging ALF.      Exercises General Exercises - Lower Extremity Ankle Circles/Pumps: AROM;Both;AAROM;10 reps (AA to initiate then able to continue) Quad Sets: AAROM;Both;10 reps;Supine (needs hands under her needs and vcs for technique) Gluteal Sets: AROM;Both;5 reps;Seated Long Arc Quad: AROM;Both;10 reps;Seated Heel Slides: AROM;Both;5 reps;Supine (reclined in  chair) Hip ABduction/ADduction: AROM;Both;10 reps;Other (comment) (reclined in chair) Hip Flexion/Marching: AROM;Both;10 reps;Seated Other Exercises Other Exercises: STS x3 reps for BLE strengthening    General Comments General comments (skin integrity, edema, etc.): HR and SpO2 WNL on RA; RR elevated to mid-30's with exertion but ~26rpm resting in chair after activity; coughing frequently      Pertinent Vitals/Pain Pain Assessment: No/denies pain Pain Intervention(s): Monitored during session;Repositioned (reviewed pressure offloading positions in chair)    Home Living Family/patient expects to be discharged to:: Assisted living             Home Equipment: Walker - 2 wheels;Bedside commode;Hand held shower head;Grab bars - tub/shower;Toilet riser;Shower seat;Wheelchair - Banker      Prior Function Level of Independence: Needs assistance  Gait / Transfers Assistance Needed: Patient generally uses the RW, but does walk at times. ADL's / Homemaking Assistance Needed: pt requires assistance for ADLs     PT Goals (current goals can now be found in the care plan section) Acute Rehab PT Goals Patient Stated Goal: Patient wanting to feel better PT Goal Formulation: With patient Time For Goal Achievement: 07/15/20 Potential to Achieve Goals: Fair Progress towards PT goals: Progressing toward goals    Frequency    Min 3X/week      PT Plan Current plan remains appropriate    Co-evaluation              AM-PAC PT "6 Clicks" Mobility   Outcome Measure  Help needed turning from your back to your side while in a flat bed without using bedrails?: None Help needed moving from lying on your back to sitting on the side of a flat bed without using bedrails?: A Little Help needed moving to and from a bed to a chair (including a wheelchair)?: A Little Help needed standing up from a chair using your arms (e.g., wheelchair or bedside chair)?: A Little Help needed  to walk in hospital room?: A Little Help needed climbing 3-5 steps with a railing? : A Lot 6 Click Score: 18    End of Session Equipment Utilized During Treatment: Gait belt (purewick) Activity Tolerance: Patient tolerated treatment well;Other (comment) (incontinence/need for multiple clean-ups limiting standing activity) Patient left: in chair;with call bell/phone within reach;with chair alarm set;Other (comment);with family/visitor present (pt personal caretake Diana [name]?) present t/o) Nurse Communication: Mobility status PT Visit Diagnosis: Unsteadiness on feet (R26.81);History of falling (Z91.81)     Time: 6283-1517 PT Time Calculation (min) (ACUTE ONLY): 37 min  Charges:  $Therapeutic Exercise: 8-22 mins $Therapeutic Activity: 8-22 mins                     Artur Winningham P., PTA Acute Rehabilitation Services Pager: [phone] Office: [place] 07/04/2020, 1:52 PM

## 2020-07-04 NOTE — Evaluation (Signed)
Occupational Therapy Evaluation Patient Details Name: Diana [name] MRN: [id number] DOB: 1933/05/31 Today's Date: 07/04/2020    History of Present Illness 87 y.o. female presents to [facility] ED from [place] on 06/30/2020 with SOB and cough. Pt also reports generalized weakness and multiple falls over the past few days. Pt found positive in ED for FluA 06/28/2020. Imgaging of chest is unremarkable in ED. Past medical history significant for atrial fibrillation, hypothyroidism, hypertension, diabetes mellitus type 2, chronic kidney disease stage III, history of breast cancer, hyperlipidemia.   Clinical Impression   Patient admitted for the diagnosis above.  PTA she lived at an ILF/ALF with assist for ADL and used a wheelchair at times. Barriers are listed below. Currently she is needing up to [facility] for basic transfers, and lower body ADL.  She may be close to her baseline, it is difficult to fully ascertain her prior level of function due to baseline cognitive dysfunction.  OT will follow in the acute setting to ensure max functional potential for a return to her ILF/ALF.      Follow Up Recommendations  No OT follow up    Equipment Recommendations  None recommended by OT    Recommendations for Other Services       Precautions / Restrictions Precautions Precautions: Fall Precaution Comments: urinary incontinence Restrictions Weight Bearing Restrictions: No      Mobility Bed Mobility Overal bed mobility: Needs Assistance Bed Mobility: Supine to Sit     Supine to sit: Supervision          Transfers Overall transfer level: Needs assistance Equipment used: Rolling walker (2 wheeled) Transfers: Sit to/from [facility] Sit to Stand: Min guard Stand pivot transfers: Min guard            Balance Overall balance assessment: Needs assistance Sitting-balance support: No upper extremity supported;Feet supported Sitting balance-Leahy Scale: Good     Standing  balance support: Bilateral upper extremity supported Standing balance-Leahy Scale: Poor Standing balance comment: pt reliant on RW                           ADL either performed or assessed with clinical judgement   ADL Overall ADL's : Needs assistance/impaired Eating/Feeding: Set up;Sitting   Grooming: Wash/dry hands;Wash/dry face;Set up;Sitting   Upper Body Bathing: Set up;Sitting   Lower Body Bathing: Minimal assistance;Sit to/from stand   Upper Body Dressing : Set up;Sitting   Lower Body Dressing: Minimal assistance;Sit to/from stand               Functional mobility during ADLs: Min guard;Rolling walker       Vision Baseline Vision/History: Wears glasses Patient Visual Report: No change from baseline       Perception     Praxis      Pertinent Vitals/Pain Pain Assessment: No/denies pain Pain Intervention(s): Monitored during session     Hand Dominance Right   Extremity/Trunk Assessment Upper Extremity Assessment Upper Extremity Assessment: Generalized weakness   Lower Extremity Assessment Lower Extremity Assessment: Defer to PT evaluation   Cervical / Trunk Assessment Cervical / Trunk Assessment: Kyphotic   Communication Communication Communication: No difficulties   Cognition Arousal/Alertness: Awake/alert Behavior During Therapy: Flat affect Overall Cognitive Status: History of cognitive impairments - at baseline  Home Living Family/patient expects to be discharged to:: Assisted living                             Home Equipment: Gilford Rile - 2 wheels;Bedside commode;Hand held shower head;Grab bars - tub/shower;Toilet riser;Shower seat;Wheelchair - Banker          Prior Functioning/Environment Level of Independence: Needs assistance  Gait / Transfers Assistance Needed: Patient generally uses the RW, but does walk at times. ADL's  / Homemaking Assistance Needed: pt requires assistance for ADLs            OT Problem List: Decreased strength;Impaired balance (sitting and/or standing);Decreased safety awareness      OT Treatment/Interventions: Self-care/ADL training;Therapeutic exercise;Balance training;DME and/or AE instruction;Therapeutic activities    OT Goals(Current goals can be found in the care plan section) Acute Rehab OT Goals Patient Stated Goal: Patient wanting to feel better OT Goal Formulation: With patient Time For Goal Achievement: 07/18/20 Potential to Achieve Goals: Good ADL Goals Pt Will Perform Grooming: with set-up;sitting Pt Will Perform Lower Body Bathing: with min guard assist;sit to/from stand Pt Will Perform Lower Body Dressing: with min guard assist;sit to/from stand Pt Will Transfer to Toilet: with supervision;ambulating;regular height toilet Pt Will Perform Toileting - Clothing Manipulation and hygiene: with supervision;sit to/from stand  OT Frequency: Min 2X/week   Barriers to D/C:    none noted       Co-evaluation              AM-PAC OT "6 Clicks" Daily Activity     Outcome Measure Help from another person eating meals?: None Help from another person taking care of personal grooming?: None Help from another person toileting, which includes using toliet, bedpan, or urinal?: A Little Help from another person bathing (including washing, rinsing, drying)?: A Little Help from another person to put on and taking off regular upper body clothing?: None Help from another person to put on and taking off regular lower body clothing?: A Little 6 Click Score: 21   End of Session Equipment Utilized During Treatment: Rolling walker Nurse Communication: Mobility status  Activity Tolerance: Patient tolerated treatment well Patient left: in chair;with call bell/phone within reach;with chair alarm set  OT Visit Diagnosis: Unsteadiness on feet (R26.81)                Time:  1610-9604 OT Time Calculation (min): 20 min Charges:  OT General Charges $OT Visit: 1 Visit OT Evaluation $OT Eval Moderate Complexity: 1 Mod  07/04/2020  Rich, OTR/L  Acute Rehabilitation Services  Office:  [phone]   [name] 07/04/2020, 11:09 AM

## 2020-07-04 NOTE — TOC Initial Note (Signed)
Transition of Care [facility]) - Initial/Assessment Note    Patient Details  Name: Diana [name] MRN: [id number] Date of Birth: 03-27-1933  Transition of [facility] CM/SW Contact:    Vinie Sill, LCSW Phone Number: 07/04/2020, 3:51 PM  Clinical Narrative:                  CSW spoke with patient's sister in law Lelon Frohlich, that also states she is the POA-CSW introduced self and explained role. She shard patient has no children or other family members. She confirmed patient is expected to return to [facility]- but states she hopes she is not contagious. She advised CSW, to contact [name] when the patient is ready for discharge for transportation.   CSW spoke with Regina/Lag[place]-She confirmed patient was receiving PT and will need resumption PT orders - including the community([facility]) is aware and is addressing the need for the patient to transition from IL to ALF.   Thurmond Butts, MSW, LCSW Clinical Social Worker   Expected Discharge Plan: [place] ([facility]) Barriers to Discharge: Continued Medical Work up   Patient Goals and CMS Choice        Expected Discharge Plan and Services Expected Discharge Plan: [place] ([place]) In-house Referral: Clinical Social Work     Living arrangements for the past 2 months: [place]                                      Prior Living Arrangements/Services Living arrangements for the past 2 months: [place] Lives with:: Self Patient language and need for interpreter reviewed:: No        Need for Family Participation in Patient Care: Yes (Comment) Care giver support system in place?: Yes (comment)   Criminal Activity/Legal Involvement Pertinent to Current Situation/Hospitalization: No - Comment as needed  Activities of Daily Living      Permission Sought/Granted Permission sought to share information  with : Family Supports    Share Information with NAME: Copywriter, advertising     Permission granted to share info w Relationship: sister in law & POA  Permission granted to share info w Contact Information: [phone] or [phone]  Emotional Assessment       Orientation: : Oriented to Self,Oriented to Situation,Oriented to Place Alcohol / Substance Use: Not Applicable Psych Involvement: No (comment)  Admission diagnosis:  Hypoglycemia [E16.2] Influenza A [J10.1] Patient Active Problem List   Diagnosis Date Noted  . Generalized weakness 06/30/2020  . CKD (chronic kidney disease) stage 3, GFR 30-59 ml/min (HCC) 06/30/2020  . Dyspnea 06/30/2020  . Influenza A 06/30/2020  . Closed dislocation of left elbow 03/19/2020  . Depression, recurrent ([place]) 01/21/2019  . Hyperlipidemia associated with type 2 diabetes mellitus ([place]) 01/21/2019  . Acute metabolic encephalopathy 54/00/8676  . Fall 01/11/2019  . Klebsiella UTI (urinary tract infection) 01/11/2019  . Acute urinary retention 01/11/2019  . Left kidney lesion 01/11/2019  . Ulcerative esophagitis 01/11/2019  . Persistent atrial fibrillation ([place])   . Anemia 01/01/2019  . Hiatal hernia   . Symptomatic anemia 12/31/2018  . Hypoglycemia 12/31/2018  . Ribs, multiple fractures 12/31/2018  . HTN (hypertension) 05/15/2013  . OSA on CPAP 05/15/2013  . Hypothyroidism 05/15/2013  . Secondary DM with CKD stage 3 and hypertension ([place]) 05/15/2013  . Lower extremity edema 05/15/2013  . Renal insufficiency 05/15/2013  .  Morbid obesity ([place]) 05/15/2013  . Breast cancer ([place]) 03/18/2011   PCP:  Christain Sacramento, MD Pharmacy:   [facility] [address] [place] [address] Phone: [phone] Fax: [phone]     Social Determinants of Health (SDOH) Interventions    Readmission Risk Interventions No flowsheet data found.

## 2020-07-05 DIAGNOSIS — E162 Hypoglycemia, unspecified: Secondary | ICD-10-CM | POA: Diagnosis not present

## 2020-07-05 LAB — SARS CORONAVIRUS 2 (TAT 6-24 HRS): SARS Coronavirus 2: NEGATIVE

## 2020-07-05 LAB — GLUCOSE, CAPILLARY
Glucose-Capillary: 105 mg/dL — ABNORMAL HIGH (ref 70–99)
Glucose-Capillary: 170 mg/dL — ABNORMAL HIGH (ref 70–99)
Glucose-Capillary: 169 mg/dL — ABNORMAL HIGH (ref 70–99)
Glucose-Capillary: 173 mg/dL — ABNORMAL HIGH (ref 70–99)

## 2020-07-05 MED ORDER — SENNOSIDES-DOCUSATE SODIUM 8.6-50 MG PO TABS
1.0000 | ORAL_TABLET | Freq: Two times a day (BID) | ORAL | Status: DC
  Administered 2020-07-05 – 2020-07-06 (×3): 1 via ORAL
  Filled 2020-07-05 (×3): qty 1

## 2020-07-05 MED ORDER — BISACODYL 10 MG RE SUPP
10.0000 mg | Freq: Once | RECTAL | Status: AC
  Administered 2020-07-05: 10 mg via RECTAL
  Filled 2020-07-05: qty 1

## 2020-07-05 NOTE — Plan of Care (Signed)

## 2020-07-05 NOTE — TOC Progression Note (Addendum)
Transition of Care [facility]) - Progression Note    Patient Details  Name: Diana [name] MRN: [id number] Date of Birth: October 23, 1933  Transition [facility]) CM/SW [name], [name] Phone Number: 07/05/2020, 3:58 PM  Clinical Narrative:     CSW waiting on returned call form [facility]- to clarify disposition plan- and services/support  Will be in place for patient.   CSW spoke with her POA- she states no one is able to provide 24/7 supervision. She requested CSW begin SNF search-  explained not sure if insurance is going to approve SNF placement but will begin the search-(PT recommending [facility] and OT no follow up) preferred SNF is [name] - she states understanding.  CSW will continue to follow and assist with discharge planning.  CSW will provide bed offers once available  [facility]- reference # [id number]   Thurmond Butts, MSW, LCSW Clinical Social Worker    Expected Discharge Plan: [name] Barriers to Discharge: Continued Medical Work up  Expected Discharge Plan and Services Expected Discharge Plan: [name] In-house Referral: Clinical Social Work     Living arrangements for the past 2 months: [name]                                       Social Determinants of Health (SDOH) Interventions    Readmission Risk Interventions No flowsheet data found.

## 2020-07-05 NOTE — Progress Notes (Signed)
Pt c/o abdominal pain and nausea.No BM in several days. MD ordered suppository.RN administered suppository, however rectal vault was full of stool. RN assisted patient to [facility]. After several minutes patient unable to pass stool, RN disimpacted patient. Patient states she feels relief. Patient cleaned and assisted back to bed. Currently resting in no acute distress. Fuller Canada, RN

## 2020-07-05 NOTE — Progress Notes (Signed)
PROGRESS NOTE    Diana [name]  EXH:[id number] DOB: 05/17/1933 DOA: 06/30/2020 PCP: Diana Sacramento, MD    Brief Narrative:  Diana [name] is a 87 year old female with past medical history significant for type 2 diabetes mellitus, essential hypertension, hyperlipidemia, permanent atrial fibrillation, CKD stage IIIb, history of breast cancer who was brought to the ED on 4/23 2022 from [facility] independent living with generalized weakness associated with a few falls over the past 3-4 days.  Patient was recently seen in the ED 2 days prior, diagnosed with influenza A and started on Tamiflu, subsequently discharged back home.  She continued with generalized weakness, poor appetite and was found to have difficulty breathing in supine position with mild productive cough.  In the ED, patient was hemodynamically stable, was noted to be in rate controlled A. fib with blood sugar low at 43.  Patient was given oral glucose and IV dextrose.  CT chest negative for infiltrate or rib fractures.  Labs unremarkable.  Given her significant debility from her baseline with generalized weakness, multiple falls in the setting of dyspnea secondary to acute influenza A, hospitalist service was consulted for further evaluation and management.   Assessment & Plan:   Principal Problem:   Hypoglycemia Active Problems:   HTN (hypertension)   Persistent atrial fibrillation (HCC)   Generalized weakness   CKD (chronic kidney disease) stage 3, GFR 30-59 ml/min (HCC)   Dyspnea   Influenza A   Influenza A Patient representing to the hospital with generalized weakness, dyspnea.  CT chest unrevealing.  Diagnosed with influenza A on 06/28/2020. --Tamiflu, complete 5-day course on 07/04/2020 --Mucinex, Tessalon Perles, Tylenol as needed  Type 2 diabetes mellitus with hypoglycemia Patient noted to have blood sugar 43 on admission, treated with oral glucose and IV dextrose.  Home medication include Lantus 7  units nightly, glimepiride 2 mg p.o. twice daily, metformin 500 mg p.o. daily.  Hypoglycemia now resolved. --Holding home metformin --Lantus 5 [place] QHS --Glimepride 2 mg p.o. BID --SSI for further coverage --CBG qAC/HS  Permanent atrial fibrillation --Metoprolol tartrate 12.5 mg p.o. twice daily --Eliquis 2.5 mg p.o. twice daily  Essential hypertension --Amlodipine 10 mg p.o. daily --Metoprolol tartrate 12.5 mg p.o. twice daily --Losartan 50 mg p.o. twice daily  CKD stage IIIb Stable.  Cognitive impairment Patient with likely mild underlying dementia.  Alert and oriented to person/place but not time.  Would benefit from increase from IDL to assisted living as she is unlikely to be able to manage her home medications given her cognitive impairment.  Constipation No bowel movement reported in 3 days. Reports abdominal discomfort. --Schedule senna twice daily --Bisacodyl suppository today  Generalized weakness Frequent falls Patient currently resident at [facility] independent living, now with generalized weakness and confusion.  Likely need increased help at facility and increase from IDL to assisted living. --Continue PT/OT efforts while inpatient --SW for SNF placement   DVT prophylaxis: Eliquis   Code Status: Full Code Family Communication: No family present at bedside this morning.  Disposition Plan:  Level of care: Telemetry Medical Status is: Inpatient  Remains inpatient appropriate because:Unsafe d/c plan   Dispo: The patient is from: [facility]              Anticipated d/c is to: SNF              Patient currently is medically stable to d/c.   Difficult to place patient No    Consultants:   none  Procedures:   none  Antimicrobials:   Tamiflu   Subjective: Patient seen and examined at bedside, resting comfortably.  Reports abdominal discomfort this morning.  No bowel movement for 3 days.  Discussed with patient plan to return to [facility]  with increase in services to assisted living with PT/OT.  Social work looking into need for 24-hour care at [facility] given her cognitive impairment.  No other questions or concerns at this time. Denies headache, no chest pain, no palpitations, no shortness of breath, no dizziness, no cough/congestion, no fever/chills/night sweats, no nausea/vomiting/diarrhea.  No acute concerns overnight per nursing staff.  Objective: Vitals:   07/04/20 0339 07/04/20 2041 07/05/20 0125 07/05/20 0342  BP: 114/71 (!) 155/98 109/69 140/77  Pulse: 60 64 (!) 49 60  Resp: 16 18 19 18   Temp: 98.4 F (36.9 C) 98.3 F (36.8 C) 98.5 F (36.9 C) 98.1 F (36.7 C)  TempSrc: Oral Oral Axillary Oral  SpO2: 95% 100% 96% 98%  Weight:      Height:        Intake/Output Summary (Last 24 hours) at 07/05/2020 1048 Last data filed at 07/05/2020 0800 Gross per 24 hour  Intake 120 ml  Output 900 ml  Net -780 ml   Filed Weights   07/01/20 0612  Weight: 86.1 kg    Examination:  General exam: Appears calm and comfortable, chronically ill in appearance, pleasantly confused Respiratory system: Clear to auscultation. Respiratory effort normal.  On room air Cardiovascular system: S1 & S2 heard, irregularly irregular rhythm, normal rate. No JVD, murmurs, rubs, gallops or clicks. No pedal edema. Gastrointestinal system: Abdomen is nondistended, soft and nontender. No organomegaly or masses felt. Normal bowel sounds heard. Central nervous system: Alert to person ([place]), [facility]), but not time (2002). No focal neurological deficits. Extremities: Moves all extremities independently, power preserved, no lower extremity edema Skin: No rashes, lesions or ulcers Psychiatry: Judgement and insight appear poor. Mood & affect appropriate.     Data Reviewed: I have personally reviewed following labs and imaging studies  CBC: Recent Labs  Lab 06/30/20 1422 07/01/20 0348 07/02/20 0109  WBC 6.6 3.8* 4.0  NEUTROABS  --    --  2.4  HGB 14.3 12.6 11.5*  HCT 46.3* 40.2 36.2  MCV 95.1 92.2 91.9  PLT 173 182 0000000   Basic Metabolic Panel: Recent Labs  Lab 06/30/20 1814 07/01/20 0348 07/02/20 0109 07/03/20 0810  NA 137  136 132* 134* 134*  K 4.0  4.2 4.1 4.1 3.9  CL 100  100 99 102 104  CO2 27  27 24 24 22   GLUCOSE 43*  42* 74 87 134*  BUN 22  23 21  28* 24*  CREATININE 1.25*  1.26* 1.21* 1.44* 1.27*  CALCIUM 8.3*  8.1* 8.1* 8.0* 8.1*  MG  --   --  1.7  --   PHOS  --   --  2.5  --    GFR: Estimated Creatinine Clearance: 36.4 mL/min (A) (by C-G formula based on SCr of 1.27 mg/dL (H)). Liver Function Tests: Recent Labs  Lab 06/30/20 1814  AST 28  ALT 15  ALKPHOS 86  BILITOT 0.8  PROT 6.4*  ALBUMIN 3.0*   No results for input(s): LIPASE, AMYLASE in the last 168 hours. No results for input(s): AMMONIA in the last 168 hours. Coagulation Profile: No results for input(s): INR, PROTIME in the last 168 hours. Cardiac Enzymes: Recent Labs  Lab 06/30/20 1814  CKTOTAL 71   BNP (  last 3 results) No results for input(s): PROBNP in the last 8760 hours. HbA1C: No results for input(s): HGBA1C in the last 72 hours. CBG: Recent Labs  Lab 07/04/20 0630 07/04/20 1100 07/04/20 1634 07/04/20 2120 07/05/20 0617  GLUCAP 96 209* 156* 163* 105*   Lipid Profile: No results for input(s): CHOL, HDL, LDLCALC, TRIG, CHOLHDL, LDLDIRECT in the last 72 hours. Thyroid Function Tests: No results for input(s): TSH, T4TOTAL, FREET4, T3FREE, THYROIDAB in the last 72 hours. Anemia Panel: No results for input(s): VITAMINB12, FOLATE, FERRITIN, TIBC, IRON, RETICCTPCT in the last 72 hours. Sepsis Labs: No results for input(s): PROCALCITON, LATICACIDVEN in the last 168 hours.  Recent Results (from the past 240 hour(s))  Urine Culture     Status: None   Collection Time: 06/28/20  9:30 AM   Specimen: Urine, Random  Result Value Ref Range Status   Specimen Description URINE, RANDOM  Final   Special Requests  NONE  Final   Culture   Final    NO GROWTH Performed at [facility], [address] [place], [place] [address]    Report Status 06/29/2020 FINAL  Final  Resp Panel by RT-PCR (Flu A&B, Covid) Nasopharyngeal Swab     Status: Abnormal   Collection Time: 06/28/20  9:33 AM   Specimen: Nasopharyngeal Swab; Nasopharyngeal(NP) swabs in vial transport medium  Result Value Ref Range Status   SARS Coronavirus 2 by RT PCR NEGATIVE NEGATIVE Final    Comment: (NOTE) SARS-CoV-2 target nucleic acids are NOT DETECTED.  The SARS-CoV-2 RNA is generally detectable in upper respiratory specimens during the acute phase of infection. The lowest concentration of SARS-CoV-2 viral copies this assay can detect is 138 copies/mL. A negative result does not preclude SARS-Cov-2 infection and should not be used as the sole basis for treatment or other patient management decisions. A negative result may occur with  improper specimen collection/handling, submission of specimen other than nasopharyngeal swab, presence of viral mutation(s) within the areas targeted by this assay, and inadequate number of viral copies(<138 copies/mL). A negative result must be combined with clinical observations, patient history, and epidemiological information. The expected result is Negative.  Fact Sheet for Patients:  EntrepreneurPulse.com.au  Fact Sheet for Healthcare Providers:  IncredibleEmployment.be  This test is no t yet approved or cleared by the [place] FDA and  has been authorized for detection and/or diagnosis of SARS-CoV-2 by FDA under an Emergency Use Authorization (EUA). This EUA will remain  in effect (meaning this test can be used) for the duration of the COVID-19 declaration under Section 564(b)(1) of the Act, 21 U.S.C.section 360bbb-3(b)(1), unless the authorization is terminated  or revoked sooner.       Influenza A by PCR POSITIVE (A) NEGATIVE Final    Influenza B by PCR NEGATIVE NEGATIVE Final    Comment: (NOTE) The Xpert Xpress SARS-CoV-2/FLU/RSV plus assay is intended as an aid in the diagnosis of influenza from Nasopharyngeal swab specimens and should not be used as a sole basis for treatment. Nasal washings and aspirates are unacceptable for Xpert Xpress SARS-CoV-2/FLU/RSV testing.  Fact Sheet for Patients: EntrepreneurPulse.com.au  Fact Sheet for Healthcare Providers: IncredibleEmployment.be  This test is not yet approved or cleared by the [place] FDA and has been authorized for detection and/or diagnosis of SARS-CoV-2 by FDA under an Emergency Use Authorization (EUA). This EUA will remain in effect (meaning this test can be used) for the duration of the COVID-19 declaration under Section 564(b)(1) of the Act, 21 U.S.C.  section 360bbb-3(b)(1), unless the authorization is terminated or revoked.  Performed at [facility], [place] [address], [place], [place] [address]   Resp Panel by RT-PCR (Flu A&B, Covid) Nasopharyngeal Swab     Status: Abnormal   Collection Time: 06/30/20 11:59 PM   Specimen: Nasopharyngeal Swab; Nasopharyngeal(NP) swabs in vial transport medium  Result Value Ref Range Status   SARS Coronavirus 2 by RT PCR NEGATIVE NEGATIVE Final    Comment: (NOTE) SARS-CoV-2 target nucleic acids are NOT DETECTED.  The SARS-CoV-2 RNA is generally detectable in upper respiratory specimens during the acute phase of infection. The lowest concentration of SARS-CoV-2 viral copies this assay can detect is 138 copies/mL. A negative result does not preclude SARS-Cov-2 infection and should not be used as the sole basis for treatment or other patient management decisions. A negative result may occur with  improper specimen collection/handling, submission of specimen other than nasopharyngeal swab, presence of viral mutation(s) within the areas targeted by this assay, and inadequate  number of viral copies(<138 copies/mL). A negative result must be combined with clinical observations, patient history, and epidemiological information. The expected result is Negative.  Fact Sheet for Patients:  EntrepreneurPulse.com.au  Fact Sheet for Healthcare Providers:  IncredibleEmployment.be  This test is no t yet approved or cleared by the [place] FDA and  has been authorized for detection and/or diagnosis of SARS-CoV-2 by FDA under an Emergency Use Authorization (EUA). This EUA will remain  in effect (meaning this test can be used) for the duration of the COVID-19 declaration under Section 564(b)(1) of the Act, 21 U.S.C.section 360bbb-3(b)(1), unless the authorization is terminated  or revoked sooner.       Influenza A by PCR POSITIVE (A) NEGATIVE Final   Influenza B by PCR NEGATIVE NEGATIVE Final    Comment: (NOTE) The Xpert Xpress SARS-CoV-2/FLU/RSV plus assay is intended as an aid in the diagnosis of influenza from Nasopharyngeal swab specimens and should not be used as a sole basis for treatment. Nasal washings and aspirates are unacceptable for Xpert Xpress SARS-CoV-2/FLU/RSV testing.  Fact Sheet for Patients: EntrepreneurPulse.com.au  Fact Sheet for Healthcare Providers: IncredibleEmployment.be  This test is not yet approved or cleared by the [place] FDA and has been authorized for detection and/or diagnosis of SARS-CoV-2 by FDA under an Emergency Use Authorization (EUA). This EUA will remain in effect (meaning this test can be used) for the duration of the COVID-19 declaration under Section 564(b)(1) of the Act, 21 U.S.C. section 360bbb-3(b)(1), unless the authorization is terminated or revoked.  Performed at [facility], [place] [address] [place], [place] [address]          Radiology Studies: No results found.      Scheduled Meds: . amLODipine  10 mg  Oral Daily  . apixaban  2.5 mg Oral BID  . benzonatate  200 mg Oral TID  . brimonidine  1 drop Both Eyes Q12H  . FLUoxetine  20 mg Oral Daily  . glimepiride  2 mg Oral BID  . guaiFENesin  600 mg Oral BID  . insulin aspart  0-15 Units Subcutaneous TID AC & HS  . insulin glargine  5 Units Subcutaneous QHS  . levothyroxine  50 mcg Oral Daily  . losartan  50 mg Oral BID  . metoprolol tartrate  12.5 mg Oral BID  . rosuvastatin  10 mg Oral QHS  . senna-docusate  1 tablet Oral BID  . timolol  1 drop Both Eyes BID  Continuous Infusions:   LOS: 3 days    Time spent: 39 minutes spent on chart review, discussion with nursing staff, consultants, updating family and interview/physical exam; more than 50% of that time was spent in counseling and/or coordination of care.    Brallan Denio J [place] DO [facility] Available via Epic secure chat 7am-7pm After these hours, please refer to coverage provider listed on [email or web address] 07/05/2020, 10:48 AM

## 2020-07-05 NOTE — TOC Progression Note (Signed)
Transition of Care [facility]) - Progression Note    Patient Details  Name: Diana [name] MRN: [id number] Date of Birth: 1933/04/07  Transition of Care [facility]) CM/SW [place], [place] Phone Number: 07/05/2020, 10:12 AM  Clinical Narrative:     Rollene Fare w/ [facility] - informed she will call CSW back today, regarding providing 24/hr supervision for patient.  Thurmond Butts, MSW, LCSW Clinical Social Worker   Expected Discharge Plan: [place] ([facility]) Barriers to Discharge: Continued Medical Work up  Expected Discharge Plan and Services Expected Discharge Plan: [place] ([place]) In-house Referral: Clinical Social Work     Living arrangements for the past 2 months: [place]                                       Social Determinants of Health (SDOH) Interventions    Readmission Risk Interventions No flowsheet data found.

## 2020-07-05 NOTE — Progress Notes (Signed)
Patient is very confused. She told the writer she could not feed herself but she is in fact able to eat after set up as RN has witnessed her eating. She requires moderated assist to stand from bed and a lot of verbal cues to pivot in order to get to [facility]. When patient stood she was incontinent and urinated all over the floor. The patient also can not consistently keep a conversation. During a conversation the patient trailed off the topic and was asking for her car and purse frantically. RN gave her reassurance regarding her belongings being safe and at home, reoriented her to where she was and cleaned her after incontinent episode and safely got her back into her bed. Fuller Canada, RN

## 2020-07-05 NOTE — Progress Notes (Signed)
Pt's HR now in 60's maintaning. Will continue to monitor.

## 2020-07-05 NOTE — NC FL2 (Signed)
[place] LEVEL OF CARE SCREENING TOOL     IDENTIFICATION  Patient Name: Diana [name] Birthdate: 23-Jun-1933 Sex: female Admission Date (Current Location): 06/30/2020  [facility] and [place] Number:      Facility and Address:  [facility] [place], [place] [address], [place], [place] [address]      Provider Number: [id number]  Attending Physician Name and Address:  [place] Donnamarie Poag, DO  Relative Name and Phone Number:       Current Level of Care: Hospital Recommended Level of Care: [place] Prior Approval Number:    Date Approved/Denied:   PASRR Number: [id number]  Discharge Plan: SNF    Current Diagnoses: Patient Active Problem List   Diagnosis Date Noted  . Generalized weakness 06/30/2020  . CKD (chronic kidney disease) stage 3, GFR 30-59 ml/min (HCC) 06/30/2020  . Dyspnea 06/30/2020  . Influenza A 06/30/2020  . Closed dislocation of left elbow 03/19/2020  . Depression, recurrent ([place]) 01/21/2019  . Hyperlipidemia associated with type 2 diabetes mellitus ([place]) 01/21/2019  . Acute metabolic encephalopathy 93/23/5573  . Fall 01/11/2019  . Klebsiella UTI (urinary tract infection) 01/11/2019  . Acute urinary retention 01/11/2019  . Left kidney lesion 01/11/2019  . Ulcerative esophagitis 01/11/2019  . Persistent atrial fibrillation ([place])   . Anemia 01/01/2019  . Hiatal hernia   . Symptomatic anemia 12/31/2018  . Hypoglycemia 12/31/2018  . Ribs, multiple fractures 12/31/2018  . HTN (hypertension) 05/15/2013  . OSA on CPAP 05/15/2013  . Hypothyroidism 05/15/2013  . Secondary DM with CKD stage 3 and hypertension ([place]) 05/15/2013  . Lower extremity edema 05/15/2013  . Renal insufficiency 05/15/2013  . Morbid obesity ([place]) 05/15/2013  . Breast cancer ([place]) 03/18/2011    Orientation RESPIRATION BLADDER Height & Weight     Self,Place,Situation  Normal External catheter,Incontinent Weight: 189 lb 13.1 oz (86.1  kg) Height:  5\' 6"  (167.6 cm)  BEHAVIORAL SYMPTOMS/MOOD NEUROLOGICAL BOWEL NUTRITION STATUS      Continent Diet (please see discharge summary)  AMBULATORY STATUS COMMUNICATION OF NEEDS Skin   Supervision Verbally Normal                       Personal Care Assistance Level of Assistance  Bathing,Feeding,Dressing Bathing Assistance: Limited assistance Feeding assistance: Independent Dressing Assistance: Limited assistance     Functional Limitations Info  Sight,Hearing,Speech Sight Info: Impaired Hearing Info: Adequate Speech Info: Adequate    SPECIAL CARE FACTORS FREQUENCY  PT (By licensed PT),OT (By licensed OT)     PT Frequency: 5x per wek OT Frequency: 5x per wek            Contractures Contractures Info: Not present    Additional Factors Info  Code Status,Allergies Code Status Info: FULL Allergies Info: Codeine sulfate,sulfa antibiotics           Current Medications (07/05/2020):  This is the current hospital active medication list Current Facility-Administered Medications  Medication Dose Route Frequency Provider Last Rate Last Admin  . acetaminophen (TYLENOL) tablet 650 mg  650 mg Oral Q6H PRN Chotiner, Yevonne Aline, MD       Or  . acetaminophen (TYLENOL) suppository 650 mg  650 mg Rectal Q6H PRN Chotiner, Yevonne Aline, MD      . albuterol (PROVENTIL) (2.5 MG/3ML) 0.083% nebulizer solution 2.5 mg  2.5 mg Nebulization Q2H PRN Chotiner, Yevonne Aline, MD      . amLODipine (NORVASC) tablet 10 mg  10 mg Oral Daily Dahal,  Marlowe Aschoff, MD   10 mg at 07/05/20 0946  . apixaban (ELIQUIS) tablet 2.5 mg  2.5 mg Oral BID Chotiner, Yevonne Aline, MD   2.5 mg at 07/05/20 0945  . benzonatate (TESSALON) capsule 200 mg  200 mg Oral TID Terrilee Croak, MD   200 mg at 07/05/20 0946  . brimonidine (ALPHAGAN) 0.2 % ophthalmic solution 1 drop  1 drop Both Eyes Q12H Chotiner, Yevonne Aline, MD   1 drop at 07/05/20 0947  . FLUoxetine (PROZAC) capsule 20 mg  20 mg Oral Daily Chotiner, Yevonne Aline, MD   20  mg at 07/05/20 0946  . glimepiride (AMARYL) tablet 2 mg  2 mg Oral BID Terrilee Croak, MD   2 mg at 07/05/20 0946  . guaiFENesin (MUCINEX) 12 hr tablet 600 mg  600 mg Oral BID Terrilee Croak, MD   600 mg at 07/05/20 0946  . guaiFENesin-dextromethorphan (ROBITUSSIN DM) 100-10 MG/5ML syrup 5 mL  5 mL Oral Q4H PRN Dahal, Marlowe Aschoff, MD   5 mL at 07/03/20 1955  . hydrALAZINE (APRESOLINE) injection 10 mg  10 mg Intravenous Q6H PRN Dahal, Marlowe Aschoff, MD   10 mg at 07/02/20 1300  . insulin aspart (novoLOG) injection 0-15 Units  0-15 Units Subcutaneous TID AC & HS Shalhoub, Sherryll Burger, MD   3 Units at 07/05/20 1212  . insulin glargine (LANTUS) injection 5 Units  5 Units Subcutaneous QHS [place]e Jude, [facility]   5 Units at 07/04/20 2220  . levothyroxine (SYNTHROID) tablet 50 mcg  50 mcg Oral Daily Chotiner, Yevonne Aline, MD   50 mcg at 07/05/20 0946  . losartan (COZAAR) tablet 50 mg  50 mg Oral BID Terrilee Croak, MD   50 mg at 07/05/20 0946  . metoprolol tartrate (LOPRESSOR) tablet 12.5 mg  12.5 mg Oral BID Chotiner, Yevonne Aline, MD   12.5 mg at 07/05/20 0946  . rosuvastatin (CRESTOR) tablet 10 mg  10 mg Oral QHS Chotiner, Yevonne Aline, MD   10 mg at 07/04/20 2218  . senna-docusate (Senokot-S) tablet 1 tablet  1 tablet Oral BID [place] Eric J, DO   1 tablet at 07/05/20 1211  . timolol (TIMOPTIC) 0.5 % ophthalmic solution 1 drop  1 drop Both Eyes BID Chotiner, Yevonne Aline, MD   1 drop at 07/05/20 8786     Discharge Medications: Please see discharge summary for a list of discharge medications.  Relevant Imaging Results:  Relevant Lab Results:   Additional Information SSN [id number]  Vinie Sill, LCSW

## 2020-07-05 NOTE — Progress Notes (Signed)
Text paged DR. Olevia Bowens and notified pts HR has been 34-50's non sustained with no s/s of distress. Vitals stable besides HR. Updated BP and o2 sat. Was given metoprolol at 2218 last night when HR was 74.  Pt resting in bed , call bell within reach.  No new orders at this time. . Will continue to monitor.

## 2020-07-06 DIAGNOSIS — J101 Influenza due to other identified influenza virus with other respiratory manifestations: Secondary | ICD-10-CM | POA: Diagnosis not present

## 2020-07-06 DIAGNOSIS — E162 Hypoglycemia, unspecified: Secondary | ICD-10-CM | POA: Diagnosis not present

## 2020-07-06 DIAGNOSIS — N1832 Chronic kidney disease, stage 3b: Secondary | ICD-10-CM | POA: Diagnosis not present

## 2020-07-06 DIAGNOSIS — I10 Essential (primary) hypertension: Secondary | ICD-10-CM

## 2020-07-06 DIAGNOSIS — R531 Weakness: Secondary | ICD-10-CM

## 2020-07-06 LAB — GLUCOSE, CAPILLARY: Glucose-Capillary: 82 mg/dL (ref 70–99)

## 2020-07-06 MED ORDER — INSULIN GLARGINE 100 UNIT/ML SOLOSTAR PEN: 7.0000 [IU] | PEN_INJECTOR | Freq: Every day | SUBCUTANEOUS | Status: DC

## 2020-07-06 NOTE — Discharge Summary (Signed)
Physician Discharge Summary  Diana [name] BHA:[id number] DOB: 1933/08/30 DOA: 06/30/2020  PCP: Christain Sacramento, MD  Admit date: 06/30/2020 Discharge date: 07/06/2020  Admitted From: ILF Disposition: SNF  Recommendations for Outpatient Follow-up:  1. Follow up with PCP in 1-2 weeks  Home Health: none Equipment/Devices: none  Discharge Condition: stable CODE STATUS: Full code Diet recommendation: heart healthy  HPI: Per admitting MD, Diana [name] is a 87 y.o. female with medical history significant for atrial fibrillation, hypothyroidism, hypertension, diabetes mellitus type 2, chronic kidney disease stage III, history of breast cancer, hyperlipidemia who presents for evaluation of shortness of breath.  Her daughter came to visit her around noon today and when she was sitting with her she noticed that she appeared to have difficulty breathing.  Patient states she felt something is caught in her throat and she was not breathing as easily as she normally does.  A mild nonproductive cough but did not have any fever.  Denies any chest pain or pressure.  She has not had any palpitations.  She has had generalized weakness for the last few days and has had a couple of falls over the last 3 to 4 days.  She was seen 2 days ago in the emergency room after a fall and was found to not have any fractures but was positive for influenza A and started on Tamiflu.  She did not start the medication until yesterday due to having trouble getting the prescription filled she states.  She reports she has not had much of an appetite the last few days and did not eat dinner last night or any meals today.  States that the staff this was to help her with her medications at the facility but have not been consistent with this.  Patient does not remember if she took her medications this morning.  The pharmacy tech is called the facility and when they checked her room her morning pills were still in her pillbox  this afternoon.  He states she did take her medications last night but she is not sure how much Lantus she used.  Hospital Course / Discharge diagnoses: Principal problem Influenza A -patient was admitted to the hospital after recent diagnosis of influenza A, with generalized weakness, dyspnea.  CT chest was unrevealing, and she completed a 5-day course of Tamiflu.  She is afebrile, recovering, remains weak and will be discharged to SNF in stable condition.  She is on room air on discharge.  Active problems Type 2 diabetes mellitus with hypoglycemia -patient had a hypoglycemic episode on admission, I will discontinue glimepiride on discharge.  Continue Lantus at home dose of 7 units. Permanent atrial fibrillation -continue low-dose metoprolol, while sleeping she is slightly bradycardic but normotensive, asymptomatic, and heart rate is within normal parameters while awake.  Continue Eliquis Essential hypertension  -continue home regimen CKD stage IIIb -Stable Cognitive impairment  -Patient with likely mild underlying dementia. Alert and oriented to person/place but not time.  Generalized weakness, frequent falls - Patient currently resident at [facility] independent living, now with generalized weakness worsening in the setting of influenza.  She will be discharged to short-term SNF for rehab  Sepsis ruled out   Discharge Instructions   Allergies as of 07/06/2020      Reactions   Codeine Sulfate Nausea Only   Other Other (See Comments)   Sodium polyoxyethylene tridecyl sulfate- PER MAR   Sulfa Antibiotics Other (See Comments)   Unknown reaction  Medication List    STOP taking these medications   glimepiride 2 MG tablet Commonly known as: AMARYL   oseltamivir 75 MG capsule Commonly known as: TAMIFLU     TAKE these medications   amLODipine 10 MG tablet Commonly known as: NORVASC Take 1 tablet (10 mg total) by mouth daily.   apixaban 2.5 MG Tabs tablet Commonly known as:  ELIQUIS Take 1 tablet (2.5 mg total) by mouth 2 (two) times daily.   brimonidine-timolol 0.2-0.5 % ophthalmic solution Commonly known as: Combigan Place 1 drop into both eyes every 12 (twelve) hours.   ferrous sulfate 325 (65 FE) MG tablet Take 1 tablet (325 mg total) by mouth 2 (two) times daily with a meal. What changed: when to take this   FLUoxetine 20 MG capsule Commonly known as: PROZAC Take 1 capsule (20 mg total) by mouth daily.   insulin glargine 100 UNIT/ML Solostar Pen Commonly known as: LANTUS Inject 7 Units into the skin at bedtime.   levothyroxine 50 MCG tablet Commonly known as: SYNTHROID Take 1 tablet (50 mcg total) by mouth daily.   losartan 50 MG tablet Commonly known as: COZAAR Take 1 tablet (50 mg total) by mouth 2 (two) times daily.   metFORMIN 500 MG tablet Commonly known as: GLUCOPHAGE Take 500 mg by mouth daily with breakfast.   metoprolol tartrate 25 MG tablet Commonly known as: LOPRESSOR Take 0.5 tablets (12.5 mg total) by mouth 2 (two) times daily.   pantoprazole 40 MG tablet Commonly known as: PROTONIX Take 1 tablet (40 mg total) by mouth 2 (two) times daily before a meal.   PRESERVISION AREDS 2 PO Take 1 capsule by mouth every morning.   rosuvastatin 10 MG tablet Commonly known as: CRESTOR Take 10 mg by mouth at bedtime.            Durable Medical Equipment  (From admission, onward)         Start     Ordered   07/05/20 1059  For home use only DME Walker rolling  Once       Question Answer Comment  Walker: With 5 Inch Wheels   Patient needs a walker to treat with the following condition Gait disorder      07/05/20 1058          Consultations:  none  Procedures/Studies:  DG Chest 2 View  Result Date: 06/30/2020 CLINICAL DATA:  Shortness of breath, vomiting EXAM: CHEST - 2 VIEW COMPARISON:  06/28/2020 FINDINGS: Patient is rotated. Stable cardiomegaly. Atherosclerotic calcification of the aortic knob. Pulmonary  vascular congestion. Asymmetric prominence of the right hilar region is likely accentuated by rotation. Hazy left mid lung opacity. No pneumothorax. Bilateral rib fractures. Lateral left sixth rib fracture may be acute. Right-sided rib fractures appear unchanged from prior. IMPRESSION: 1. Hazy left mid lung opacity, which may reflect infiltrate. 2. Bilateral rib fractures. Lateral left sixth rib fracture may be acute. Correlate with point tenderness. 3. Cardiomegaly with pulmonary vascular congestion. Electronically Signed   By: Davina Poke D.O.   On: 06/30/2020 14:05   CT Head Wo Contrast  Result Date: 06/28/2020 CLINICAL DATA:  Fall, facial injury.  Blood thinners. EXAM: CT HEAD WITHOUT CONTRAST TECHNIQUE: Contiguous axial images were obtained from the base of the skull through the vertex without intravenous contrast. COMPARISON:  02/07/2020 FINDINGS: Brain: Moderate atrophy. Moderate white matter changes with patchy hypodensity bilaterally similar to prior study. Negative for acute infarct, hemorrhage, hydrocephalus. Vascular: Negative for hyperdense vessel Skull: Negative  calvarium Sinuses/Orbits: Paranasal sinuses clear. Right mastoid tip effusion. Negative orbit. Other: None IMPRESSION: No acute abnormality no change from the prior study Moderate atrophy and moderate chronic microvascular ischemic change in the white matter. Electronically Signed   By: Franchot Gallo M.D.   On: 06/28/2020 11:28   CT Chest Wo Contrast  Result Date: 06/30/2020 CLINICAL DATA:  Chest trauma, shortness of breath, cough EXAM: CT CHEST WITHOUT CONTRAST TECHNIQUE: Multidetector CT imaging of the chest was performed following the standard protocol without IV contrast. COMPARISON:  CT 12/31/2018 FINDINGS: Cardiovascular: Heart size is mildly enlarged. No pericardial effusion. Thoracic aorta is nonaneurysmal. Atherosclerotic calcifications of the aorta and coronary arteries. Main pulmonary trunk measures 3.4 cm in diameter.  Mediastinum/Nodes: No axillary, mediastinal, or hilar lymphadenopathy. Surgical clips present in the bilateral axillary regions. Trachea within normal limits. Moderate-sized hiatal hernia. Esophagus unremarkable. Lungs/Pleura: Trace right pleural effusion with mild bibasilar dependent atelectasis. Mild bronchial wall thickening. Lungs are otherwise clear. No pneumothorax. Upper Abdomen: No acute abnormality. Musculoskeletal: No acute osseous findings. Thoracic vertebral body heights are maintained. Mild scoliotic curvature. Multilevel degenerative disc disease. Incidental T11 vertebral body hemangioma. Advanced bilateral glenohumeral arthropathy. Bilateral mastectomies. No chest wall lesion. IMPRESSION: 1. Trace right pleural effusion with mild bibasilar dependent atelectasis. 2. Mild cardiomegaly. 3. Moderate-sized hiatal hernia. 4. Mildly dilated main pulmonary trunk, which can be seen with pulmonary arterial hypertension. 5. Aortic and coronary artery atherosclerosis (ICD10-I70.0). Electronically Signed   By: Davina Poke D.O.   On: 06/30/2020 17:13   DG Chest Port 1 View  Result Date: 06/28/2020 CLINICAL DATA:  Altered mental status, cough, fell, LEFT knee swelling and abrasion EXAM: PORTABLE CHEST 1 VIEW COMPARISON:  Portable exam 0938 hours compared to 12/30/2018 FINDINGS: Enlargement of cardiac silhouette with pulmonary vascular congestion. Atherosclerotic calcification aorta. Lungs clear. No acute infiltrate, pleural effusion, or pneumothorax. Bones demineralized. Calcified RIGHT thyroid nodule unchanged since 05/06/2007. IMPRESSION: Enlargement of cardiac silhouette with pulmonary vascular congestion. No acute abnormalities. Aortic Atherosclerosis (ICD10-I70.0). Electronically Signed   By: Lavonia Dana M.D.   On: 06/28/2020 10:14   DG Knee Complete 4 Views Left  Result Date: 06/28/2020 CLINICAL DATA:  LEFT knee swelling and abrasion post fall EXAM: LEFT KNEE - COMPLETE 4+ VIEW COMPARISON:  None  FINDINGS: Osseous demineralization. Advanced tricompartmental osteoarthritic changes with joint space narrowing and spur formation greatest at medial compartment. Scattered chondrocalcinosis question CPPD. Lateral subluxation of tibia. No acute fracture, dislocation, or bone destruction. No joint effusion. Atherosclerotic calcifications LEFT superficial femoral artery. IMPRESSION: Advanced tricompartmental osteoarthritic changes of the LEFT knee, greatest at medial compartment. Electronically Signed   By: Lavonia Dana M.D.   On: 06/28/2020 10:15     Subjective: - no chest pain, shortness of breath, no abdominal pain, nausea or vomiting.   Discharge Exam: BP (!) 161/79 (BP Location: Right Arm)   Pulse 60   Temp 97.9 F (36.6 C) (Oral)   Resp 20   Ht 5\' 6"  (1.676 m)   Wt 86.1 kg   LMP  (LMP Unknown)   SpO2 97%   BMI 30.64 kg/m   General: Pt is alert, awake, not in acute distress Cardiovascular: RRR, S1/S2 +, no rubs, no gallops Respiratory: CTA bilaterally, no wheezing, no rhonchi Abdominal: Soft, NT, ND, bowel sounds + Extremities: no edema, no cyanosis   The results of significant diagnostics from this hospitalization (including imaging, microbiology, ancillary and laboratory) are listed below for reference.     Microbiology: Recent Results (from the past 240  hour(s))  Urine Culture     Status: None   Collection Time: 06/28/20  9:30 AM   Specimen: Urine, Random  Result Value Ref Range Status   Specimen Description URINE, RANDOM  Final   Special Requests NONE  Final   Culture   Final    NO GROWTH Performed at [facility], [address] [place], [place] [address]    Report Status 06/29/2020 FINAL  Final  Resp Panel by RT-PCR (Flu A&B, Covid) Nasopharyngeal Swab     Status: Abnormal   Collection Time: 06/28/20  9:33 AM   Specimen: Nasopharyngeal Swab; Nasopharyngeal(NP) swabs in vial transport medium  Result Value Ref Range Status   SARS Coronavirus 2 by RT PCR  NEGATIVE NEGATIVE Final    Comment: (NOTE) SARS-CoV-2 target nucleic acids are NOT DETECTED.  The SARS-CoV-2 RNA is generally detectable in upper respiratory specimens during the acute phase of infection. The lowest concentration of SARS-CoV-2 viral copies this assay can detect is 138 copies/mL. A negative result does not preclude SARS-Cov-2 infection and should not be used as the sole basis for treatment or other patient management decisions. A negative result may occur with  improper specimen collection/handling, submission of specimen other than nasopharyngeal swab, presence of viral mutation(s) within the areas targeted by this assay, and inadequate number of viral copies(<138 copies/mL). A negative result must be combined with clinical observations, patient history, and epidemiological information. The expected result is Negative.  Fact Sheet for Patients:  EntrepreneurPulse.com.au  Fact Sheet for Healthcare Providers:  IncredibleEmployment.be  This test is no t yet approved or cleared by the [place] FDA and  has been authorized for detection and/or diagnosis of SARS-CoV-2 by FDA under an Emergency Use Authorization (EUA). This EUA will remain  in effect (meaning this test can be used) for the duration of the COVID-19 declaration under Section 564(b)(1) of the Act, 21 U.S.C.section 360bbb-3(b)(1), unless the authorization is terminated  or revoked sooner.       Influenza A by PCR POSITIVE (A) NEGATIVE Final   Influenza B by PCR NEGATIVE NEGATIVE Final    Comment: (NOTE) The Xpert Xpress SARS-CoV-2/FLU/RSV plus assay is intended as an aid in the diagnosis of influenza from Nasopharyngeal swab specimens and should not be used as a sole basis for treatment. Nasal washings and aspirates are unacceptable for Xpert Xpress SARS-CoV-2/FLU/RSV testing.  Fact Sheet for Patients: EntrepreneurPulse.com.au  Fact Sheet for  Healthcare Providers: IncredibleEmployment.be  This test is not yet approved or cleared by the [place] FDA and has been authorized for detection and/or diagnosis of SARS-CoV-2 by FDA under an Emergency Use Authorization (EUA). This EUA will remain in effect (meaning this test can be used) for the duration of the COVID-19 declaration under Section 564(b)(1) of the Act, 21 U.S.C. section 360bbb-3(b)(1), unless the authorization is terminated or revoked.  Performed at [facility], [place] [address], [place], [place] [address]   Resp Panel by RT-PCR (Flu A&B, Covid) Nasopharyngeal Swab     Status: Abnormal   Collection Time: 06/30/20 11:59 PM   Specimen: Nasopharyngeal Swab; Nasopharyngeal(NP) swabs in vial transport medium  Result Value Ref Range Status   SARS Coronavirus 2 by RT PCR NEGATIVE NEGATIVE Final    Comment: (NOTE) SARS-CoV-2 target nucleic acids are NOT DETECTED.  The SARS-CoV-2 RNA is generally detectable in upper respiratory specimens during the acute phase of infection. The lowest concentration of SARS-CoV-2 viral copies this assay can detect is 138 copies/mL. A  negative result does not preclude SARS-Cov-2 infection and should not be used as the sole basis for treatment or other patient management decisions. A negative result may occur with  improper specimen collection/handling, submission of specimen other than nasopharyngeal swab, presence of viral mutation(s) within the areas targeted by this assay, and inadequate number of viral copies(<138 copies/mL). A negative result must be combined with clinical observations, patient history, and epidemiological information. The expected result is Negative.  Fact Sheet for Patients:  EntrepreneurPulse.com.au  Fact Sheet for Healthcare Providers:  IncredibleEmployment.be  This test is no t yet approved or cleared by the [place] FDA and  has been  authorized for detection and/or diagnosis of SARS-CoV-2 by FDA under an Emergency Use Authorization (EUA). This EUA will remain  in effect (meaning this test can be used) for the duration of the COVID-19 declaration under Section 564(b)(1) of the Act, 21 U.S.C.section 360bbb-3(b)(1), unless the authorization is terminated  or revoked sooner.       Influenza A by PCR POSITIVE (A) NEGATIVE Final   Influenza B by PCR NEGATIVE NEGATIVE Final    Comment: (NOTE) The Xpert Xpress SARS-CoV-2/FLU/RSV plus assay is intended as an aid in the diagnosis of influenza from Nasopharyngeal swab specimens and should not be used as a sole basis for treatment. Nasal washings and aspirates are unacceptable for Xpert Xpress SARS-CoV-2/FLU/RSV testing.  Fact Sheet for Patients: EntrepreneurPulse.com.au  Fact Sheet for Healthcare Providers: IncredibleEmployment.be  This test is not yet approved or cleared by the [place] FDA and has been authorized for detection and/or diagnosis of SARS-CoV-2 by FDA under an Emergency Use Authorization (EUA). This EUA will remain in effect (meaning this test can be used) for the duration of the COVID-19 declaration under Section 564(b)(1) of the Act, 21 U.S.C. section 360bbb-3(b)(1), unless the authorization is terminated or revoked.  Performed at [facility], [place] [address], [place], [place] [address]   SARS CORONAVIRUS 2 (TAT 6-24 HRS) Nasopharyngeal Nasopharyngeal Swab     Status: None   Collection Time: 07/05/20 10:46 AM   Specimen: Nasopharyngeal Swab  Result Value Ref Range Status   SARS Coronavirus 2 NEGATIVE NEGATIVE Final    Comment: (NOTE) SARS-CoV-2 target nucleic acids are NOT DETECTED.  The SARS-CoV-2 RNA is generally detectable in upper and lower respiratory specimens during the acute phase of infection. Negative results do not preclude SARS-CoV-2 infection, do not rule out co-infections with other  pathogens, and should not be used as the sole basis for treatment or other patient management decisions. Negative results must be combined with clinical observations, patient history, and epidemiological information. The expected result is Negative.  Fact Sheet for Patients: SugarRoll.be  Fact Sheet for Healthcare Providers: https://www.woods-mathews.com/  This test is not yet approved or cleared by the [place] FDA and  has been authorized for detection and/or diagnosis of SARS-CoV-2 by FDA under an Emergency Use Authorization (EUA). This EUA will remain  in effect (meaning this test can be used) for the duration of the COVID-19 declaration under Se ction 564(b)(1) of the Act, 21 U.S.C. section 360bbb-3(b)(1), unless the authorization is terminated or revoked sooner.  Performed at [facility], [place] [address] [place], [place] [address]      Labs: Basic Metabolic Panel: Recent Labs  Lab 06/30/20 1814 07/01/20 0348 07/02/20 0109 07/03/20 0810  NA 137  136 132* 134* 134*  K 4.0  4.2 4.1 4.1 3.9  CL 100  100 99 102 104  CO2  27  27 24 24 22   GLUCOSE 43*  42* 74 87 134*  BUN 22  23 21  28* 24*  CREATININE 1.25*  1.26* 1.21* 1.44* 1.27*  CALCIUM 8.3*  8.1* 8.1* 8.0* 8.1*  MG  --   --  1.7  --   PHOS  --   --  2.5  --    Liver Function Tests: Recent Labs  Lab 06/30/20 1814  AST 28  ALT 15  ALKPHOS 86  BILITOT 0.8  PROT 6.4*  ALBUMIN 3.0*   CBC: Recent Labs  Lab 06/30/20 1422 07/01/20 0348 07/02/20 0109  WBC 6.6 3.8* 4.0  NEUTROABS  --   --  2.4  HGB 14.3 12.6 11.5*  HCT 46.3* 40.2 36.2  MCV 95.1 92.2 91.9  PLT 173 182 195   CBG: Recent Labs  Lab 07/05/20 0617 07/05/20 1123 07/05/20 1626 07/05/20 2120 07/06/20 0604  GLUCAP 105* 170* 169* 173* 82   Hgb A1c No results for input(s): HGBA1C in the last 72 hours. Lipid Profile No results for input(s): CHOL, HDL, LDLCALC, TRIG, CHOLHDL,  LDLDIRECT in the last 72 hours. Thyroid function studies No results for input(s): TSH, T4TOTAL, T3FREE, THYROIDAB in the last 72 hours.  Invalid input(s): FREET3 Urinalysis    Component Value Date/Time   COLORURINE YELLOW 06/28/2020 1010   APPEARANCEUR HAZY (A) 06/28/2020 1010   LABSPEC 1.019 06/28/2020 1010   PHURINE 5.0 06/28/2020 1010   GLUCOSEU NEGATIVE 06/28/2020 1010   HGBUR NEGATIVE 06/28/2020 1010   BILIRUBINUR NEGATIVE 06/28/2020 1010   KETONESUR NEGATIVE 06/28/2020 1010   PROTEINUR 100 (A) 06/28/2020 1010   UROBILINOGEN 0.2 07/05/2009 0331   NITRITE NEGATIVE 06/28/2020 1010   LEUKOCYTESUR NEGATIVE 06/28/2020 1010    FURTHER DISCHARGE INSTRUCTIONS:   Get Medicines reviewed and adjusted: Please take all your medications with you for your next visit with your Primary MD   Laboratory/radiological data: Please request your Primary MD to go over all hospital tests and procedure/radiological results at the follow up, please ask your Primary MD to get all Hospital records sent to his/her office.   In some cases, they will be blood work, cultures and biopsy results pending at the time of your discharge. Please request that your primary care M.D. goes through all the records of your hospital data and follows up on these results.   Also Note the following: If you experience worsening of your admission symptoms, develop shortness of breath, life threatening emergency, suicidal or homicidal thoughts you must seek medical attention immediately by calling 911 or calling your MD immediately  if symptoms less severe.   You must read complete instructions/literature along with all the possible adverse reactions/side effects for all the Medicines you take and that have been prescribed to you. Take any new Medicines after you have completely understood and accpet all the possible adverse reactions/side effects.    Do not drive when taking Pain medications or sleeping medications  (Benzodaizepines)   Do not take more than prescribed Pain, Sleep and Anxiety Medications. It is not advisable to combine anxiety,sleep and pain medications without talking with your primary care practitioner   Special Instructions: If you have smoked or chewed Tobacco  in the last 2 yrs please stop smoking, stop any regular Alcohol  and or any Recreational drug use.   Wear Seat belts while driving.   Please note: You were cared for by a hospitalist during your hospital stay. Once you are discharged, your primary care physician will handle any  further medical issues. Please note that NO REFILLS for any discharge medications will be authorized once you are discharged, as it is imperative that you return to your primary care physician (or establish a relationship with a primary care physician if you do not have one) for your post hospital discharge needs so that they can reassess your need for medications and monitor your lab values.  Time coordinating discharge: 40 minutes  SIGNED:  Marzetta Board, MD, PhD 07/06/2020, 9:42 AM

## 2020-07-06 NOTE — Progress Notes (Signed)
Discharge instructions reviewed with pt and her home caregiver, [name].  Pt transported off floor via volunteers, [name] to transport to her IL[facility].

## 2020-07-06 NOTE — Care Management Important Message (Signed)
Important Message  Patient Details  Name: Diana [name] MRN: [id number] Date of Birth: 12-20-1933   Medicare Important Message Given:  Yes     Shelda Altes 07/06/2020, 9:28 AM

## 2020-07-06 NOTE — Progress Notes (Signed)
Discharge orders received.  IV and telemetry removed. CCMD notified. Pt caregiver, [name], will be transporting back to [place].  She has been notified and can be here around noon. Will review discharge education at that time.

## 2020-07-06 NOTE — Progress Notes (Signed)
Physical Therapy Treatment Patient Details Name: Diana [name] MRN: [id number] DOB: May 22, 1933 Today's Date: 07/06/2020    History of Present Illness 87 y.o. female presents to [facility] ED from ILF on 06/30/2020 with SOB and cough. Pt also reports generalized weakness and multiple falls over the past few days. Pt found positive in ED for FluA 06/28/2020. Imaging of chest is unremarkable in ED. PMH: Afib, hypothyroidism, hypertension, diabetes mellitus type 2, chronic kidney disease stage III, history of breast cancer, hyperlipidemia.    PT Comments    Pt received in supine, c/o feeling unwell but agreeable to bed mobility, transfer training and exercises with encouragement. Pt performed functional mobility tasks with up to minA, needing increased assist to stand this date than in previous sessions. Pt remains incontinent but able to tolerate standing tasks better this date due to briefs donned in supine prior to OOB tasks. Pt continues to benefit from PT services to progress toward functional mobility goals. Continue to recommend HHPT if 24/7 assist available.   Follow Up Recommendations  Home health PT;Supervision/Assistance - 24 hour;Other (comment) (pt would likely benefit from transition from independent living to assisted living, 24/7 supervision due to cognitive deficits and falls history)     Equipment Recommendations  Rolling walker with 5" wheels;Other (comment) (will benefit from RW if pt does not have access to one at [employer], pt does report intermittent use of RW so may own one already)    Recommendations for Other Services       Precautions / Restrictions Precautions Precautions: Fall Precaution Comments: urinary incontinence Restrictions Weight Bearing Restrictions: No    Mobility  Bed Mobility Overal bed mobility: Needs Assistance Bed Mobility: Rolling;Sidelying to Sit;Sit to Sidelying Rolling: Min guard Sidelying to sit: Min guard     Sit to sidelying: Min  guard General bed mobility comments: cues for self-assist and use of rails but needs tactile cues due to fatigue/dementia related confusion    Transfers Overall transfer level: Needs assistance Equipment used: Rolling walker (2 wheeled) Transfers: Sit to/from Stand Sit to Stand: Min assist         General transfer comment: from EOB x3 trials, needs increased lift assist today than previous session  Ambulation/Gait Ambulation/Gait assistance: Min assist Gait Distance (Feet): 3 Feet Assistive device: Rolling walker (2 wheeled) Gait Pattern/deviations: Step-to pattern;Shuffle     General Gait Details: heavy cues for sequencing and RW assist, lateral steps only to [facility] pt refusing more.   Stairs             Wheelchair Mobility    Modified Rankin (Stroke Patients Only)       Balance Overall balance assessment: Needs assistance Sitting-balance support: No upper extremity supported;Feet supported Sitting balance-Leahy Scale: Good     Standing balance support: Bilateral upper extremity supported Standing balance-Leahy Scale: Poor Standing balance comment: pt reliant on RW and min guard to minA for sidestepping                            Cognition Arousal/Alertness: Awake/alert Behavior During Therapy: Flat affect Overall Cognitive Status: History of cognitive impairments - at baseline                                 General Comments: Pt with some awareness of her deficits but does have a history of falls and very fatigued/coughing frequently during session; Pt with  noted short term memory deficit and per her caretaker she just transitioned to ILF recently, may have difficulty arranging ALF. Pt very incontinent of urine.      Exercises General Exercises - Lower Extremity Ankle Circles/Pumps: AROM;Both;AAROM;10 reps (AA to initiate then able to continue) Gluteal Sets: AROM;Both;5 reps;Seated Long Arc Quad: AROM;Both;10 reps;Seated Hip  ABduction/ADduction: AROM;Both;Other (comment);5 reps Hip Flexion/Marching: AROM;Both;10 reps;Standing Heel Raises: AROM;Both;10 reps;Standing Other Exercises Other Exercises: STS x3 reps for BLE strengthening    General Comments General comments (skin integrity, edema, etc.): frequent incontinence, briefs and purewick remain in place; HR 61-75 during mobility adn SpO2 98% on RA; BP 135/68 (85)      Pertinent Vitals/Pain Pain Assessment: Faces Faces Pain Scale: Hurts little more Pain Location: rectum when standing and sidestepping (when purewick suctioning), RN notified she may need barrier cream applied? Pain Descriptors / Indicators: Grimacing Pain Intervention(s): Monitored during session;Repositioned;Other (comment);Limited activity within patient's tolerance (see location for info)    Home Living                      Prior Function            PT Goals (current goals can now be found in the care plan section) Acute Rehab PT Goals Patient Stated Goal: Patient wanting to feel better PT Goal Formulation: With patient Time For Goal Achievement: 07/15/20 Potential to Achieve Goals: Fair Progress towards PT goals: Progressing toward goals    Frequency    Min 3X/week      PT Plan Current plan remains appropriate    Co-evaluation              AM-PAC PT "6 Clicks" Mobility   Outcome Measure  Help needed turning from your back to your side while in a flat bed without using bedrails?: A Little Help needed moving from lying on your back to sitting on the side of a flat bed without using bedrails?: A Little Help needed moving to and from a bed to a chair (including a wheelchair)?: A Little Help needed standing up from a chair using your arms (e.g., wheelchair or bedside chair)?: A Little Help needed to walk in hospital room?: A Little Help needed climbing 3-5 steps with a railing? : A Lot 6 Click Score: 17    End of Session Equipment Utilized During  Treatment: Gait belt;Other (comment) (purewick, briefs) Activity Tolerance: Other (comment);Patient limited by fatigue (incontinence/fatigue limiting standing tolerance) Patient left: with call bell/phone within reach;Other (comment);in bed;with bed alarm set (bed in chair position) Nurse Communication: Mobility status;Other (comment) (briefs donned and new pair in room) PT Visit Diagnosis: Unsteadiness on feet (R26.81);History of falling (Z91.81)     Time: 1749-4496 PT Time Calculation (min) (ACUTE ONLY): 29 min  Charges:  $Therapeutic Exercise: 8-22 mins $Therapeutic Activity: 8-22 mins                     Arcola Freshour P., PTA Acute Rehabilitation Services Pager: [phone] Office: [place] 07/06/2020, 12:56 PM

## 2020-07-06 NOTE — Progress Notes (Signed)
When patient is asleep her H.R. drops in the 30's for a few beats then back to the 40's-50's  With no complaints.

## 2020-07-06 NOTE — TOC Transition Note (Addendum)
Transition of Care (TOC) - CM/SW Discharge Note Marvetta Gibbons RN, BSN Transitions of Care Unit 4E- RN Case Manager See Treatment Team for direct phone #    Patient Details  Name: Diana [name] MRN: [id number] Date of Birth: [birth year]  Transition of Care [facility]) CM/SW Contact:  Dawayne Mark, RN Phone Number: 07/06/2020, 12:17 PM   Clinical Narrative:    Pt stable for transition back to IL at [facility], spoke with Rollene Fare at [place]- they will set up PT/OT for pt- orders have been faxed- also confirmed with RN/aide has been arranged with [facility] that facility has assisted pt/family with. Per pt she has a RW at home already, no RW needed for discharge.  [name]- caregiver- will transport home- [phone]. Bedside RN to call [name] when pt ready for transport.    Final next level of care: [place] Barriers to Discharge: Barriers Resolved   Patient Goals and CMS Choice Patient states their goals for this hospitalization and ongoing recovery are:: return home      Discharge Placement               Home with [facility]        Discharge Plan and Services In-house Referral: Clinical Social Work Discharge Planning Services: CM Consult Post Acute Care Choice: Home Health,Durable Medical Equipment          DME Arranged: Walker rolling,Patient refused services         HH Arranged: RN,PT,OT [place] Agency: Other - See comment Date HH Agency Contacted: 07/06/20 Time [place] Contacted: 1000 Representative spoke with at [place]: Rollene Fare with Building surveyor at [place] ([place]) Interventions     Readmission Risk Interventions Readmission Risk Prevention Plan 07/06/2020  Transportation Screening Complete  PCP or Specialist Appt within 5-7 Days Complete  Home Care Screening Complete  Medication Review (RN CM) Complete  Some recent data might be hidden

## 2020-07-10 ENCOUNTER — Ambulatory Visit
Admission: RE | Admit: 2020-07-10 | Discharge: 2020-07-10 | Disposition: A | Discharge status: Home / Self Care | Payer: Medicare PPO | Source: Ambulatory Visit | Attending: Otolaryngology | Admitting: Otolaryngology

## 2020-07-10 DIAGNOSIS — E041 Nontoxic single thyroid nodule: Secondary | ICD-10-CM

## 2020-07-10 NOTE — Progress Notes (Addendum)
87 y.o. female outpatient. History of a fib ( on eliquis) HTN, DM, CKD, HLD, breast cancer. Incidental finding of a right inferior lobe nodule on OSH [place] [facility] from 1214.21. The nodule is measuring approximately 2.0 cm. Patient presents with caregiver for thyroid biopsy. Due to the high density calcification surrounding the nodule attempts for biopsy unsuccessful. Case reviewed with IR Attending Dr. Rolla Plate who recommend procedure be terminated. Plan of care discussed with the Patient and her caregiver. Both verbalized understanding and are in agreement.

## 2020-07-31 ENCOUNTER — Emergency Department (HOSPITAL_COMMUNITY)
Admission: EM | Admit: 2020-07-31 | Discharge: 2020-07-31 | Disposition: A | Discharge status: Home / Self Care | Payer: Medicare PPO | Attending: Emergency Medicine | Admitting: Emergency Medicine

## 2020-07-31 ENCOUNTER — Emergency Department (HOSPITAL_COMMUNITY): Payer: Medicare PPO

## 2020-07-31 ENCOUNTER — Other Ambulatory Visit: Payer: Self-pay

## 2020-07-31 DIAGNOSIS — Z794 Long term (current) use of insulin: Secondary | ICD-10-CM | POA: Diagnosis not present

## 2020-07-31 DIAGNOSIS — I4891 Unspecified atrial fibrillation: Secondary | ICD-10-CM | POA: Diagnosis not present

## 2020-07-31 DIAGNOSIS — Z79899 Other long term (current) drug therapy: Secondary | ICD-10-CM | POA: Insufficient documentation

## 2020-07-31 DIAGNOSIS — W010XXA Fall on same level from slipping, tripping and stumbling without subsequent striking against object, initial encounter: Secondary | ICD-10-CM

## 2020-07-31 DIAGNOSIS — Y92129 Unspecified place in nursing home as the place of occurrence of the external cause: Secondary | ICD-10-CM | POA: Insufficient documentation

## 2020-07-31 DIAGNOSIS — N183 Chronic kidney disease, stage 3 unspecified: Secondary | ICD-10-CM | POA: Diagnosis not present

## 2020-07-31 DIAGNOSIS — E039 Hypothyroidism, unspecified: Secondary | ICD-10-CM | POA: Diagnosis not present

## 2020-07-31 DIAGNOSIS — F0390 Unspecified dementia without behavioral disturbance: Secondary | ICD-10-CM | POA: Diagnosis present

## 2020-07-31 DIAGNOSIS — Z7901 Long term (current) use of anticoagulants: Secondary | ICD-10-CM | POA: Insufficient documentation

## 2020-07-31 DIAGNOSIS — I129 Hypertensive chronic kidney disease with stage 1 through stage 4 chronic kidney disease, or unspecified chronic kidney disease: Secondary | ICD-10-CM | POA: Insufficient documentation

## 2020-07-31 DIAGNOSIS — Z853 Personal history of malignant neoplasm of breast: Secondary | ICD-10-CM | POA: Diagnosis not present

## 2020-07-31 DIAGNOSIS — E1122 Type 2 diabetes mellitus with diabetic chronic kidney disease: Secondary | ICD-10-CM | POA: Diagnosis not present

## 2020-07-31 DIAGNOSIS — Z7984 Long term (current) use of oral hypoglycemic drugs: Secondary | ICD-10-CM | POA: Insufficient documentation

## 2020-07-31 DIAGNOSIS — W19XXXA Unspecified fall, initial encounter: Secondary | ICD-10-CM | POA: Insufficient documentation

## 2020-07-31 DIAGNOSIS — Z87891 Personal history of nicotine dependence: Secondary | ICD-10-CM | POA: Insufficient documentation

## 2020-07-31 DIAGNOSIS — Z8659 Personal history of other mental and behavioral disorders: Secondary | ICD-10-CM

## 2020-07-31 DIAGNOSIS — R030 Elevated blood-pressure reading, without diagnosis of hypertension: Secondary | ICD-10-CM

## 2020-07-31 NOTE — ED Triage Notes (Signed)
Fall on Blood Thinners, not leveled per Charge RN  Pt found on floor per staff at nursing facility. Unwitnessed fall. No injuries. No complaints. HX dementia. C-collar in place.

## 2020-07-31 NOTE — ED Notes (Signed)
Patient transported to CT 

## 2020-07-31 NOTE — Discharge Instructions (Signed)
It was our pleasure to provide your ER care today - we hope that you feel better.  Fall precautions - use great care and walker, and call for help when wanting to get up and walk.   Given history or falls, follow up with your doctor this coming week - discuss whether given your risk of falls whether or not to continue your blood thinner medication.   Your blood pressure is high today - limt salt intake, continue your blood pressure meds, and follow up with your doctor in 1-2 weeks.   Return to ER if worse, new symptoms, fevers, trouble breathing, new or severe pain, or other concern.

## 2020-07-31 NOTE — ED Notes (Signed)
Dr Seward Speck aware of elevated BP.

## 2020-07-31 NOTE — ED Provider Notes (Signed)
[place] EMERGENCY DEPARTMENT Provider Note   CSN: [id number] Arrival date & time: 07/31/20  1258     History Chief Complaint  Patient presents with  . Fall    Diana [name] is a 87 y.o. female.  Patient with hx dementia, s/p fall at [facility]. Pt is on eliquis, hx afib. Pt was found on ground. No noted loc. Since fall, pts mental status and functional ability described as c/w baseline. At baseline, mainly gets around in wheelchair, although walks very short distances w walker. Pt smiling, alert, denies pain or other c/o, but limited historian due to dementia - level 5 caveat. No headache. No nv. No neck/back pain. Skin intact.   The history is provided by the patient and a caregiver. The history is limited by the condition of the patient.  Fall Pertinent negatives include no headaches.       Past Medical History:  Diagnosis Date  . Anemia   . Anemia of decreased vitamin B12 absorption 05/26/2005  . Arthritis   . Atrial fibrillation ([place])   . Breast cancer ([place]) 2001  . Cataract   . GERD (gastroesophageal reflux disease)   . Hiatal hernia   . History of tobacco abuse   . Hx of colonic polyps   . Hyperlipidemia   . Hypertension   . Hypothyroidism   . Memory loss   . Obesity   . OSA on CPAP 09/2007   AHI 10.6/hr overall, 43.64/hr during REM, lost weight, does not use cpap  . Osteopenia   . Renal insufficiency   . Type 2 diabetes mellitus [facility])     Patient Active Problem List   Diagnosis Date Noted  . Generalized weakness 06/30/2020  . CKD (chronic kidney disease) stage 3, GFR 30-59 ml/min (HCC) 06/30/2020  . Dyspnea 06/30/2020  . Influenza A 06/30/2020  . Closed dislocation of left elbow 03/19/2020  . Depression, recurrent ([place]) 01/21/2019  . Hyperlipidemia associated with type 2 diabetes mellitus ([place]) 01/21/2019  . Acute metabolic encephalopathy 67/89/3810  . Fall 01/11/2019  . Klebsiella UTI (urinary tract infection) 01/11/2019  .  Acute urinary retention 01/11/2019  . Left kidney lesion 01/11/2019  . Ulcerative esophagitis 01/11/2019  . Persistent atrial fibrillation ([place])   . Anemia 01/01/2019  . Hiatal hernia   . Symptomatic anemia 12/31/2018  . Hypoglycemia 12/31/2018  . Ribs, multiple fractures 12/31/2018  . HTN (hypertension) 05/15/2013  . OSA on CPAP 05/15/2013  . Hypothyroidism 05/15/2013  . Secondary DM with CKD stage 3 and hypertension ([place]) 05/15/2013  . Lower extremity edema 05/15/2013  . Renal insufficiency 05/15/2013  . Morbid obesity ([place]) 05/15/2013  . Breast cancer ([place]) 03/18/2011    Past Surgical History:  Procedure Laterality Date  . BACK SURGERY  05/30/2009  . BIOPSY  01/01/2019   Procedure: BIOPSY;  Surgeon: Milus Banister, MD;  Location: [facility] ENDOSCOPY;  Service: Endoscopy;;  . BOWEL RESECTION  1974  . CATARACT EXTRACTION Bilateral    bilateral  . CHOLECYSTECTOMY  1974  . COLONOSCOPY    . ESOPHAGOGASTRODUODENOSCOPY N/A 01/01/2019   Procedure: ESOPHAGOGASTRODUODENOSCOPY (EGD);  Surgeon: Milus Banister, MD;  Location: [facility] ENDOSCOPY;  Service: Endoscopy;  Laterality: N/A;  . EXTERNAL FIXATION REMOVAL Left 04/27/2020   Procedure: REMOVAL EXTERNAL FIXATION ARM;  Surgeon: Shona Needles, MD;  Location: [place];  Service: Orthopedics;  Laterality: Left;  Marland Kitchen MASTECTOMY Bilateral 2001   bilateral sentinel lymph nodes bio  . NM MYOCAR PERF WALL MOTION  06/2007  dipyridamole; perfusion defect in inferior myocardium consistent with diaphragmatic attenuation, remaining myocardium with no ischemia/infarct, EF 73%; normal, low risk scan   . ORIF ELBOW FRACTURE Left 03/21/2020   Procedure: OPEN REDUCTION INTERNAL FIXATION (ORIF) ELBOW/OLECRANON FRACTURE;  Surgeon: Shona Needles, MD;  Location: [place];  Service: Orthopedics;  Laterality: Left;  . TOTAL ABDOMINAL HYSTERECTOMY  1975  . TRANSTHORACIC ECHOCARDIOGRAM  02/2010   UE=>45%, stage 1 diastolic dysfunction; borderline RV enlargement; LA mild-mod  dilated; mild mitral annular calcif & mild MR; mild TR with normal RSVP, AV moderately sclerotics     OB History   No obstetric history on file.     Family History  Problem Relation Age of Onset  . Heart attack Mother   . Heart attack Father   . Colon cancer Neg Hx     Social History   Tobacco Use  . Smoking status: Former Smoker    Years: 13.00    Types: Cigarettes    Quit date: 05/02/2002    Years since quitting: 18.2  . Smokeless tobacco: Never Used  Vaping Use  . Vaping Use: Never used  Substance Use Topics  . Alcohol use: No    Alcohol/week: 0.0 standard drinks  . Drug use: Never    Home Medications Prior to Admission medications   Medication Sig Start Date End Date Taking? Authorizing Provider  amLODipine (NORVASC) 10 MG tablet Take 1 tablet (10 mg total) by mouth daily. 05/27/20   Ngetich, Dinah C, NP  apixaban (ELIQUIS) 2.5 MG TABS tablet Take 1 tablet (2.5 mg total) by mouth 2 (two) times daily. 05/26/20   Medina-Vargas, Monina C, NP  brimonidine-timolol (COMBIGAN) 0.2-0.5 % ophthalmic solution Place 1 drop into both eyes every 12 (twelve) hours. 05/26/20   Medina-Vargas, Monina C, NP  ferrous sulfate 325 (65 FE) MG tablet Take 1 tablet (325 mg total) by mouth 2 (two) times daily with a meal. Patient taking differently: Take 325 mg by mouth every morning. 04/14/20   Medina-Vargas, Monina C, NP  FLUoxetine (PROZAC) 20 MG capsule Take 1 capsule (20 mg total) by mouth daily. 05/26/20   Medina-Vargas, Monina C, NP  insulin glargine (LANTUS) 100 UNIT/ML Solostar Pen Inject 7 Units into the skin at bedtime. 07/06/20   Caren Griffins, MD  levothyroxine (SYNTHROID) 50 MCG tablet Take 1 tablet (50 mcg total) by mouth daily. 05/26/20   Medina-Vargas, Monina C, NP  losartan (COZAAR) 50 MG tablet Take 1 tablet (50 mg total) by mouth 2 (two) times daily. 05/26/20   Medina-Vargas, Monina C, NP  metFORMIN (GLUCOPHAGE) 500 MG tablet Take 500 mg by mouth daily with breakfast.     [provider]  metoprolol tartrate (LOPRESSOR) 25 MG tablet Take 0.5 tablets (12.5 mg total) by mouth 2 (two) times daily. 05/26/20   Medina-Vargas, Monina C, NP  Multiple Vitamins-Minerals (PRESERVISION AREDS 2 PO) Take 1 capsule by mouth every morning.    [provider]  pantoprazole (PROTONIX) 40 MG tablet Take 1 tablet (40 mg total) by mouth 2 (two) times daily before a meal. 05/26/20   Medina-Vargas, Monina C, NP  rosuvastatin (CRESTOR) 10 MG tablet Take 10 mg by mouth at bedtime.    [provider]    Allergies    Codeine sulfate, Other, and Sulfa antibiotics  Review of Systems   Review of Systems  Constitutional: Negative for fever.  Gastrointestinal: Negative for vomiting.  Musculoskeletal: Negative for back pain and neck pain.  Skin: Negative for wound.  Neurological: Negative for headaches.  level 5 caveat - dementia  Physical Exam Updated Vital Signs BP (!) 194/116 (BP Location: Right Arm)   Pulse 76   Temp 98.2 F (36.8 C) (Oral)   LMP  (LMP Unknown)   SpO2 97%   Physical Exam Vitals and nursing note reviewed.  Constitutional:      Appearance: Normal appearance. She is well-developed.  HENT:     Head: Atraumatic.     Nose: Nose normal.     Mouth/Throat:     Mouth: Mucous membranes are moist.  Eyes:     General: No scleral icterus.    Conjunctiva/sclera: Conjunctivae normal.     Pupils: Pupils are equal, round, and reactive to light.  Neck:     Trachea: No tracheal deviation.  Cardiovascular:     Rate and Rhythm: Normal rate.     Pulses: Normal pulses.     Heart sounds: Normal heart sounds. No murmur heard. No friction rub. No gallop.   Pulmonary:     Effort: Pulmonary effort is normal. No respiratory distress.     Breath sounds: Normal breath sounds.  Chest:     Chest wall: No tenderness.  Abdominal:     General: There is no distension.     Palpations: Abdomen is soft.     Tenderness: There is no abdominal tenderness.   Genitourinary:    Comments: No cva tenderness.  Musculoskeletal:        General: No swelling.     Cervical back: Normal range of motion and neck supple. No rigidity or tenderness. No muscular tenderness.     Comments: CTLS spine, non tender, aligned, no step off. Good rom bil extremities without pain or focal bony tenderness.   Skin:    General: Skin is warm and dry.     Findings: No rash.  Neurological:     Mental Status: She is alert.     Comments: Alert, speech normal. Speech clear/fluent. Smiling. Moves bil extremities purposefully.   Psychiatric:        Mood and Affect: Mood normal.     ED Results / Procedures / Treatments   Labs (all labs ordered are listed, but only abnormal results are displayed) Labs Reviewed - No data to display  EKG EKG Interpretation  Date/Time:  Tuesday Jul 31 2020 13:02:22 EDT Ventricular Rate:  78 PR Interval:    QRS Duration: 91 QT Interval:  414 QTC Calculation: 481 R Axis:   -52 Text Interpretation: Atrial fibrillation Non-specific ST-t changes No significant change since last tracing Confirmed by Lajean Saver [phone]) on 07/31/2020 1:23:05 PM   Radiology No results found.  Procedures Procedures   Medications Ordered in ED Medications - No data to display  ED Course  I have reviewed the triage vital signs and the nursing notes.  Pertinent labs & imaging results that were available during my care of the patient were reviewed by me and considered in my medical decision making (see chart for details).    MDM Rules/Calculators/A&P                          Imaging study ordered.   Reviewed nursing notes and prior charts for additional history.   CT pending.   Pt remains at baseline, no apparent pain or discomfort.  1618, ct pending - signed out to Dr Darl Householder - if ct negative, may d/c back to SNF.     Final Clinical Impression(s) /  ED Diagnoses Final diagnoses:  None    Rx / DC Orders ED Discharge Orders    None        Lajean Saver, MD 07/31/20 [phone]

## 2020-08-07 ENCOUNTER — Other Ambulatory Visit: Payer: Self-pay | Admitting: Otolaryngology

## 2020-08-07 DIAGNOSIS — E041 Nontoxic single thyroid nodule: Secondary | ICD-10-CM

## 2020-08-25 ENCOUNTER — Emergency Department (HOSPITAL_COMMUNITY): Payer: Medicare PPO

## 2020-08-25 ENCOUNTER — Encounter (HOSPITAL_COMMUNITY): Payer: Self-pay

## 2020-08-25 ENCOUNTER — Observation Stay (HOSPITAL_COMMUNITY)
Admission: EM | Admit: 2020-08-25 | Discharge: 2020-08-29 | Disposition: A | Discharge status: Home / Self Care | Payer: Medicare PPO | Attending: Emergency Medicine | Admitting: Emergency Medicine

## 2020-08-25 ENCOUNTER — Other Ambulatory Visit: Payer: Self-pay

## 2020-08-25 DIAGNOSIS — Z20822 Contact with and (suspected) exposure to covid-19: Secondary | ICD-10-CM | POA: Diagnosis not present

## 2020-08-25 DIAGNOSIS — W1830XA Fall on same level, unspecified, initial encounter: Secondary | ICD-10-CM | POA: Diagnosis not present

## 2020-08-25 DIAGNOSIS — Z7901 Long term (current) use of anticoagulants: Secondary | ICD-10-CM | POA: Insufficient documentation

## 2020-08-25 DIAGNOSIS — E1122 Type 2 diabetes mellitus with diabetic chronic kidney disease: Secondary | ICD-10-CM | POA: Diagnosis not present

## 2020-08-25 DIAGNOSIS — Y92129 Unspecified place in nursing home as the place of occurrence of the external cause: Secondary | ICD-10-CM | POA: Insufficient documentation

## 2020-08-25 DIAGNOSIS — Z794 Long term (current) use of insulin: Secondary | ICD-10-CM | POA: Insufficient documentation

## 2020-08-25 DIAGNOSIS — I482 Chronic atrial fibrillation, unspecified: Secondary | ICD-10-CM | POA: Insufficient documentation

## 2020-08-25 DIAGNOSIS — I129 Hypertensive chronic kidney disease with stage 1 through stage 4 chronic kidney disease, or unspecified chronic kidney disease: Secondary | ICD-10-CM | POA: Insufficient documentation

## 2020-08-25 DIAGNOSIS — E039 Hypothyroidism, unspecified: Secondary | ICD-10-CM | POA: Insufficient documentation

## 2020-08-25 DIAGNOSIS — Z79899 Other long term (current) drug therapy: Secondary | ICD-10-CM | POA: Insufficient documentation

## 2020-08-25 DIAGNOSIS — Z7984 Long term (current) use of oral hypoglycemic drugs: Secondary | ICD-10-CM | POA: Insufficient documentation

## 2020-08-25 DIAGNOSIS — W19XXXA Unspecified fall, initial encounter: Secondary | ICD-10-CM

## 2020-08-25 DIAGNOSIS — S82831A Other fracture of upper and lower end of right fibula, initial encounter for closed fracture: Secondary | ICD-10-CM | POA: Diagnosis not present

## 2020-08-25 DIAGNOSIS — F0390 Unspecified dementia without behavioral disturbance: Secondary | ICD-10-CM | POA: Insufficient documentation

## 2020-08-25 DIAGNOSIS — N183 Chronic kidney disease, stage 3 unspecified: Secondary | ICD-10-CM | POA: Diagnosis not present

## 2020-08-25 DIAGNOSIS — I629 Nontraumatic intracranial hemorrhage, unspecified: Secondary | ICD-10-CM | POA: Diagnosis not present

## 2020-08-25 DIAGNOSIS — S8991XA Unspecified injury of right lower leg, initial encounter: Secondary | ICD-10-CM | POA: Diagnosis present

## 2020-08-25 DIAGNOSIS — S82301A Unspecified fracture of lower end of right tibia, initial encounter for closed fracture: Secondary | ICD-10-CM | POA: Insufficient documentation

## 2020-08-25 DIAGNOSIS — Z87891 Personal history of nicotine dependence: Secondary | ICD-10-CM | POA: Insufficient documentation

## 2020-08-25 DIAGNOSIS — Z853 Personal history of malignant neoplasm of breast: Secondary | ICD-10-CM | POA: Diagnosis not present

## 2020-08-25 DIAGNOSIS — R2689 Other abnormalities of gait and mobility: Secondary | ICD-10-CM | POA: Insufficient documentation

## 2020-08-25 LAB — GLUCOSE, CAPILLARY
Glucose-Capillary: 268 mg/dL — ABNORMAL HIGH (ref 70–99)
Glucose-Capillary: 339 mg/dL — ABNORMAL HIGH (ref 70–99)

## 2020-08-25 LAB — COMPREHENSIVE METABOLIC PANEL
ALT: 16 U/L (ref 0–44)
AST: 18 U/L (ref 15–41)
Albumin: 3.2 g/dL — ABNORMAL LOW (ref 3.5–5.0)
Alkaline Phosphatase: 116 U/L (ref 38–126)
Anion gap: 9 (ref 5–15)
BUN: 18 mg/dL (ref 8–23)
CO2: 26 mmol/L (ref 22–32)
Calcium: 9.1 mg/dL (ref 8.9–10.3)
Chloride: 100 mmol/L (ref 98–111)
Creatinine, Ser: 1.24 mg/dL — ABNORMAL HIGH (ref 0.44–1.00)
GFR, Estimated: 43 mL/min — ABNORMAL LOW (ref >60)
Glucose, Bld: 222 mg/dL — ABNORMAL HIGH (ref 70–99)
Potassium: 3.9 mmol/L (ref 3.5–5.1)
Sodium: 135 mmol/L (ref 135–145)
Total Bilirubin: 1.5 mg/dL — ABNORMAL HIGH (ref 0.3–1.2)
Total Protein: 7 g/dL (ref 6.5–8.1)

## 2020-08-25 LAB — CBC WITH DIFFERENTIAL/PLATELET
Abs Immature Granulocytes: 0.05 10*3/uL (ref 0.00–0.07)
Basophils Absolute: 0.1 10*3/uL (ref 0.0–0.1)
Basophils Relative: 1 %
Eosinophils Absolute: 0.1 10*3/uL (ref 0.0–0.5)
Eosinophils Relative: 1 %
HCT: 43.2 % (ref 36.0–46.0)
Hemoglobin: 13.7 g/dL (ref 12.0–15.0)
Immature Granulocytes: 1 %
Lymphocytes Relative: 6 %
Lymphs Abs: 0.6 10*3/uL — ABNORMAL LOW (ref 0.7–4.0)
MCH: 29.4 pg (ref 26.0–34.0)
MCHC: 31.7 g/dL (ref 30.0–36.0)
MCV: 92.7 fL (ref 80.0–100.0)
Monocytes Absolute: 0.9 10*3/uL (ref 0.1–1.0)
Monocytes Relative: 8 %
Neutro Abs: 8.8 10*3/uL — ABNORMAL HIGH (ref 1.7–7.7)
Neutrophils Relative %: 83 %
Platelets: 224 10*3/uL (ref 150–400)
RBC: 4.66 MIL/uL (ref 3.87–5.11)
RDW: 14.6 % (ref 11.5–15.5)
WBC: 10.5 10*3/uL (ref 4.0–10.5)
nRBC: 0 % (ref 0.0–0.2)

## 2020-08-25 LAB — RESP PANEL BY RT-PCR (FLU A&B, COVID) ARPGX2
Influenza A by PCR: NEGATIVE
Influenza B by PCR: NEGATIVE
SARS Coronavirus 2 by RT PCR: NEGATIVE

## 2020-08-25 LAB — URINALYSIS, ROUTINE W REFLEX MICROSCOPIC
Bilirubin Urine: NEGATIVE
Glucose, UA: 150 mg/dL — AB
Hgb urine dipstick: NEGATIVE
Ketones, ur: NEGATIVE mg/dL
Nitrite: NEGATIVE
Protein, ur: 100 mg/dL — AB
Specific Gravity, Urine: 1.011 (ref 1.005–1.030)
pH: 7 (ref 5.0–8.0)

## 2020-08-25 LAB — CBG MONITORING, ED: Glucose-Capillary: 210 mg/dL — ABNORMAL HIGH (ref 70–99)

## 2020-08-25 LAB — CK: Total CK: 30 U/L — ABNORMAL LOW (ref 38–234)

## 2020-08-25 MED ORDER — BRIMONIDINE TARTRATE-TIMOLOL 0.2-0.5 % OP SOLN: 1.0000 [drp] | Freq: Two times a day (BID) | OPHTHALMIC | Status: DC

## 2020-08-25 MED ORDER — ROSUVASTATIN CALCIUM 5 MG PO TABS
10.0000 mg | ORAL_TABLET | Freq: Every day | ORAL | Status: DC
  Administered 2020-08-25 – 2020-08-28 (×4): 10 mg via ORAL
  Filled 2020-08-25 (×4): qty 2

## 2020-08-25 MED ORDER — BRIMONIDINE TARTRATE 0.2 % OP SOLN
1.0000 [drp] | Freq: Two times a day (BID) | OPHTHALMIC | Status: DC
  Administered 2020-08-25 – 2020-08-29 (×8): 1 [drp] via OPHTHALMIC
  Filled 2020-08-25: qty 5

## 2020-08-25 MED ORDER — INSULIN ASPART 100 UNIT/ML IJ SOLN
0.0000 [IU] | Freq: Three times a day (TID) | INTRAMUSCULAR | Status: DC
  Administered 2020-08-25: 5 [IU] via SUBCUTANEOUS

## 2020-08-25 MED ORDER — AMLODIPINE BESYLATE 5 MG PO TABS
5.0000 mg | ORAL_TABLET | Freq: Two times a day (BID) | ORAL | Status: DC
  Administered 2020-08-25 – 2020-08-27 (×4): 5 mg via ORAL
  Filled 2020-08-25 (×4): qty 1

## 2020-08-25 MED ORDER — TIMOLOL MALEATE 0.5 % OP SOLN
1.0000 [drp] | Freq: Two times a day (BID) | OPHTHALMIC | Status: DC
  Administered 2020-08-25 – 2020-08-29 (×8): 1 [drp] via OPHTHALMIC
  Filled 2020-08-25: qty 5

## 2020-08-25 MED ORDER — AMLODIPINE BESYLATE 5 MG PO TABS: 10.0000 mg | ORAL_TABLET | Freq: Every day | ORAL | Status: DC

## 2020-08-25 MED ORDER — PANTOPRAZOLE SODIUM 40 MG PO TBEC
40.0000 mg | DELAYED_RELEASE_TABLET | Freq: Two times a day (BID) | ORAL | Status: DC
  Administered 2020-08-25 – 2020-08-29 (×8): 40 mg via ORAL
  Filled 2020-08-25 (×9): qty 1

## 2020-08-25 MED ORDER — NICARDIPINE HCL IN NACL 20-0.86 MG/200ML-% IV SOLN
3.0000 mg/h | INTRAVENOUS | Status: DC
  Administered 2020-08-25: 5 mg/h via INTRAVENOUS
  Filled 2020-08-25: qty 200

## 2020-08-25 MED ORDER — LOSARTAN POTASSIUM 50 MG PO TABS
50.0000 mg | ORAL_TABLET | Freq: Two times a day (BID) | ORAL | Status: DC
  Administered 2020-08-25 – 2020-08-29 (×8): 50 mg via ORAL
  Filled 2020-08-25 (×8): qty 1

## 2020-08-25 MED ORDER — LEVOTHYROXINE SODIUM 50 MCG PO TABS
50.0000 ug | ORAL_TABLET | Freq: Every day | ORAL | Status: DC
  Administered 2020-08-26 – 2020-08-29 (×4): 50 ug via ORAL
  Filled 2020-08-25 (×4): qty 1

## 2020-08-25 MED ORDER — SODIUM CHLORIDE 0.9% FLUSH
3.0000 mL | Freq: Two times a day (BID) | INTRAVENOUS | Status: DC
  Administered 2020-08-25 – 2020-08-29 (×8): 3 mL via INTRAVENOUS

## 2020-08-25 MED ORDER — FLUOXETINE HCL 20 MG PO CAPS
20.0000 mg | ORAL_CAPSULE | Freq: Every day | ORAL | Status: DC
  Administered 2020-08-25 – 2020-08-29 (×5): 20 mg via ORAL
  Filled 2020-08-25 (×4): qty 1

## 2020-08-25 MED ORDER — HYDRALAZINE HCL 25 MG PO TABS
25.0000 mg | ORAL_TABLET | Freq: Four times a day (QID) | ORAL | Status: DC | PRN
  Administered 2020-08-25: 25 mg via ORAL
  Filled 2020-08-25 (×2): qty 1

## 2020-08-25 MED ORDER — METOPROLOL TARTRATE 12.5 MG HALF TABLET
12.5000 mg | ORAL_TABLET | Freq: Two times a day (BID) | ORAL | Status: DC
  Administered 2020-08-25 – 2020-08-29 (×8): 12.5 mg via ORAL
  Filled 2020-08-25 (×8): qty 1

## 2020-08-25 MED ORDER — PROSIGHT PO TABS
1.0000 | ORAL_TABLET | Freq: Every morning | ORAL | Status: DC
  Administered 2020-08-26 – 2020-08-29 (×4): 1 via ORAL
  Filled 2020-08-25 (×3): qty 1

## 2020-08-25 MED ORDER — METFORMIN HCL 500 MG PO TABS
500.0000 mg | ORAL_TABLET | Freq: Every day | ORAL | Status: DC
  Administered 2020-08-26 – 2020-08-28 (×3): 500 mg via ORAL
  Filled 2020-08-25 (×2): qty 1

## 2020-08-25 MED ORDER — FERROUS SULFATE 325 (65 FE) MG PO TABS
325.0000 mg | ORAL_TABLET | Freq: Every morning | ORAL | Status: DC
  Administered 2020-08-26 – 2020-08-29 (×4): 325 mg via ORAL
  Filled 2020-08-25 (×3): qty 1

## 2020-08-25 NOTE — Progress Notes (Signed)
Orthopedic Tech Progress Note Patient Details:  Diana [name] August 22, 1933 [id number]  Ortho Devices Type of Ortho Device: Post (short leg) splint Ortho Device/Splint Location: RLE Ortho Device/Splint Interventions: Ordered, Application   Post Interventions Patient Tolerated: Well Instructions Provided: Care of device  [name] 08/25/2020, 1:26 PM

## 2020-08-25 NOTE — ED Notes (Signed)
Food and drink given at this time. Patient very thankful, states that she is starving

## 2020-08-25 NOTE — ED Notes (Signed)
Diana [name] [phone]

## 2020-08-25 NOTE — Progress Notes (Signed)
Pt called for by CT at 1005. Unable to locate pt.

## 2020-08-25 NOTE — ED Provider Notes (Signed)
Emergency Medicine Provider Triage Evaluation Note  Diana [name] , a 87 y.o. female  was evaluated in triage.  Pt complains of unwitnessed fall with unknown downtime from [facility]. Found on the floor in the supine position today. No complaints. Unsure if hit head. On a thinner.  Review of Systems  Positive: + fall Negative:   Physical Exam  BP (!) 159/121 (BP Location: Left Arm)   Pulse 85   Temp 98.2 F (36.8 C) (Oral)   Resp 18   LMP  (LMP Unknown)   SpO2 92%  Gen:   Awake, no distress   Resp:  Normal effort  MSK:   Moves extremities without difficulty  Other:    Medical Decision Making  Medically screening exam initiated at 9:35 AM.  Appropriate orders placed.  NABRIA NEVIN was informed that the remainder of the evaluation will be completed by another provider, this initial triage assessment does not replace that evaluation, and the importance of remaining in the ED until their evaluation is complete.  Hx significantly limited s/2 dementia. Pt is oriented to self. Has no complaints. Is anticoagulated with fall today. Unsure if hit head. Will obtain CT Head and CT C spine with labs including CK.    Eustaquio Maize, PA-C 08/25/20 6701    Margette Fast, MD 08/27/20 [phone]

## 2020-08-25 NOTE — H&P (Addendum)
History and Physical    Diana [name] YHC:[id number] DOB: 05/13/1933 DOA: 08/25/2020  PCP: Christain Sacramento, MD (Confirm with patient/family/NH records and if not entered, this has to be entered at [facility] point of entry) Patient coming from: Assisted living memory unit  I have personally briefly reviewed patient's old medical records in [facility]  Chief Complaint: I fell  HPI: Diana [name] is a 87 y.o. female with medical history significant of chronic ambulation dysfunction frequent falls, PAF on Eliquis, mild dementia, IDDM, HTN, HLD, hypothyroidism, CKD stage III, presented with fall.  Patient has mild dementia, provided only part of the history, and other part of the history provided by patient POA/sister-in-law over the phone.  Patient remembered she has been "feeling dizzy", she remembered she felt dizzy last night and then fell down, denied any loss of consciousness.  According to assisted living facility his record, patient was found on the floor this morning and patient was last seen yesterday evening.  Sister-in-law reported that the patient has had several episodes of fall this year, but was prevented by facility staff staff.  Currently, patient denied any pain, no headache no blurry vision no feeling of nauseous vomit no dysuria or diarrhea no fever chills.  ED Course: CT head showed with more left lateral ventricle bleed no mass-effect or midline shift.  A possible right distal tibia fracture nondisplaced.  Review of Systems: Unable to perform, patient confused  Past Medical History:  Diagnosis Date   Anemia    Anemia of decreased vitamin B12 absorption 05/26/2005   Arthritis    Atrial fibrillation ([place])    Breast cancer ([place]) 2001   Cataract    GERD (gastroesophageal reflux disease)    Hiatal hernia    History of tobacco abuse    Hx of colonic polyps    Hyperlipidemia    Hypertension    Hypothyroidism    Memory loss    Obesity    OSA on CPAP 09/2007    AHI 10.6/hr overall, 43.64/hr during REM, lost weight, does not use cpap   Osteopenia    Renal insufficiency    Type 2 diabetes mellitus ([place])     Past Surgical History:  Procedure Laterality Date   BACK SURGERY  05/30/2009   BIOPSY  01/01/2019   Procedure: BIOPSY;  Surgeon: Milus Banister, MD;  Location: [place];  Service: Endoscopy;;   BOWEL RESECTION  1974   CATARACT EXTRACTION Bilateral    bilateral   CHOLECYSTECTOMY  1974   COLONOSCOPY     ESOPHAGOGASTRODUODENOSCOPY N/A 01/01/2019   Procedure: ESOPHAGOGASTRODUODENOSCOPY (EGD);  Surgeon: Milus Banister, MD;  Location: [facility] ENDOSCOPY;  Service: Endoscopy;  Laterality: N/A;   EXTERNAL FIXATION REMOVAL Left 04/27/2020   Procedure: REMOVAL EXTERNAL FIXATION ARM;  Surgeon: Shona Needles, MD;  Location: [place];  Service: Orthopedics;  Laterality: Left;   MASTECTOMY Bilateral 2001   bilateral sentinel lymph nodes bio   NM MYOCAR PERF WALL MOTION  06/2007   dipyridamole; perfusion defect in inferior myocardium consistent with diaphragmatic attenuation, remaining myocardium with no ischemia/infarct, EF 73%; normal, low risk scan    ORIF ELBOW FRACTURE Left 03/21/2020   Procedure: OPEN REDUCTION INTERNAL FIXATION (ORIF) ELBOW/OLECRANON FRACTURE;  Surgeon: Shona Needles, MD;  Location: [place];  Service: Orthopedics;  Laterality: Left;   TOTAL ABDOMINAL HYSTERECTOMY  1975   TRANSTHORACIC ECHOCARDIOGRAM  02/2010   DV=>76%, stage 1 diastolic dysfunction; borderline RV enlargement; LA mild-mod dilated; mild mitral annular calcif &  mild MR; mild TR with normal RSVP, AV moderately sclerotics     reports that she quit smoking about 18 years ago. Her smoking use included cigarettes. She has never used smokeless tobacco. She reports that she does not drink alcohol and does not use drugs.  Allergies  Allergen Reactions   Codeine Sulfate Nausea Only   Other Other (See Comments)    Sodium polyoxyethylene tridecyl sulfate- PER MAR   Sulfa  Antibiotics Other (See Comments)    Unknown reaction    Family History  Problem Relation Age of Onset   Heart attack Mother    Heart attack Father    Colon cancer Neg Hx     Prior to Admission medications   Medication Sig Start Date End Date Taking? Authorizing Provider  amLODipine (NORVASC) 10 MG tablet Take 1 tablet (10 mg total) by mouth daily. 05/27/20   Ngetich, Dinah C, NP  apixaban (ELIQUIS) 2.5 MG TABS tablet Take 1 tablet (2.5 mg total) by mouth 2 (two) times daily. 05/26/20   Medina-Vargas, Monina C, NP  brimonidine-timolol (COMBIGAN) 0.2-0.5 % ophthalmic solution Place 1 drop into both eyes every 12 (twelve) hours. 05/26/20   Medina-Vargas, Monina C, NP  ferrous sulfate 325 (65 FE) MG tablet Take 1 tablet (325 mg total) by mouth 2 (two) times daily with a meal. Patient taking differently: Take 325 mg by mouth every morning. 04/14/20   Medina-Vargas, Monina C, NP  FLUoxetine (PROZAC) 20 MG capsule Take 1 capsule (20 mg total) by mouth daily. 05/26/20   Medina-Vargas, Monina C, NP  insulin glargine (LANTUS) 100 UNIT/ML Solostar Pen Inject 7 Units into the skin at bedtime. 07/06/20   Caren Griffins, MD  levothyroxine (SYNTHROID) 50 MCG tablet Take 1 tablet (50 mcg total) by mouth daily. 05/26/20   Medina-Vargas, Monina C, NP  losartan (COZAAR) 50 MG tablet Take 1 tablet (50 mg total) by mouth 2 (two) times daily. 05/26/20   Medina-Vargas, Monina C, NP  metFORMIN (GLUCOPHAGE) 500 MG tablet Take 500 mg by mouth daily with breakfast.    [provider]  metoprolol tartrate (LOPRESSOR) 25 MG tablet Take 0.5 tablets (12.5 mg total) by mouth 2 (two) times daily. 05/26/20   Medina-Vargas, Monina C, NP  Multiple Vitamins-Minerals (PRESERVISION AREDS 2 PO) Take 1 capsule by mouth every morning.    [provider]  pantoprazole (PROTONIX) 40 MG tablet Take 1 tablet (40 mg total) by mouth 2 (two) times daily before a meal. 05/26/20   Medina-Vargas, Monina C, NP  rosuvastatin  (CRESTOR) 10 MG tablet Take 10 mg by mouth at bedtime.    [provider]    Physical Exam: Vitals:   08/25/20 1225 08/25/20 1245 08/25/20 1300 08/25/20 1315  BP:  131/67 123/86 134/83  Pulse:  89 82 95  Resp:  (!) 22 17 15   Temp:      TempSrc:      SpO2:  91% 91% 97%  Weight: 86.1 kg     Height: 5\' 6"  (1.676 m)       Constitutional: NAD, calm, comfortable Vitals:   08/25/20 1225 08/25/20 1245 08/25/20 1300 08/25/20 1315  BP:  131/67 123/86 134/83  Pulse:  89 82 95  Resp:  (!) 22 17 15   Temp:      TempSrc:      SpO2:  91% 91% 97%  Weight: 86.1 kg     Height: 5\' 6"  (1.676 m)      Eyes: PERRL, lids and conjunctivae normal  ENMT: Mucous membranes are moist. Posterior pharynx clear of any exudate or lesions.Normal dentition.  Neck: normal, supple, no masses, no thyromegaly Respiratory: clear to auscultation bilaterally, no wheezing, no crackles. Normal respiratory effort. No accessory muscle use.  Cardiovascular: Regular rate and rhythm, loud systolic murmur on heart base. No extremity edema. 2+ pedal pulses. No carotid bruits.  Abdomen: no tenderness, no masses palpated. No hepatosplenomegaly. Bowel sounds positive.  Musculoskeletal: Right ankle swelling, tenderness and reduced ROM Skin: no rashes, lesions, ulcers. No induration Neurologic: CN 2-12 grossly intact. Sensation intact, DTR normal. Strength 5/5 in all 4.  Psychiatric: Normal judgment and insight. Alert and oriented x 3. Normal mood.     Labs on Admission: I have personally reviewed following labs and imaging studies  CBC: Recent Labs  Lab 08/25/20 0922  WBC 10.5  NEUTROABS 8.8*  HGB 13.7  HCT 43.2  MCV 92.7  PLT 097   Basic Metabolic Panel: Recent Labs  Lab 08/25/20 0922  NA 135  K 3.9  CL 100  CO2 26  GLUCOSE 222*  BUN 18  CREATININE 1.24*  CALCIUM 9.1   GFR: Estimated Creatinine Clearance: 36.7 mL/min (A) (by C-G formula based on SCr of 1.24 mg/dL (H)). Liver Function  Tests: Recent Labs  Lab 08/25/20 0922  AST 18  ALT 16  ALKPHOS 116  BILITOT 1.5*  PROT 7.0  ALBUMIN 3.2*   No results for input(s): LIPASE, AMYLASE in the last 168 hours. No results for input(s): AMMONIA in the last 168 hours. Coagulation Profile: No results for input(s): INR, PROTIME in the last 168 hours. Cardiac Enzymes: Recent Labs  Lab 08/25/20 0922  CKTOTAL 30*   BNP (last 3 results) No results for input(s): PROBNP in the last 8760 hours. HbA1C: No results for input(s): HGBA1C in the last 72 hours. CBG: No results for input(s): GLUCAP in the last 168 hours. Lipid Profile: No results for input(s): CHOL, HDL, LDLCALC, TRIG, CHOLHDL, LDLDIRECT in the last 72 hours. Thyroid Function Tests: No results for input(s): TSH, T4TOTAL, FREET4, T3FREE, THYROIDAB in the last 72 hours. Anemia Panel: No results for input(s): VITAMINB12, FOLATE, FERRITIN, TIBC, IRON, RETICCTPCT in the last 72 hours. Urine analysis:    Component Value Date/Time   COLORURINE YELLOW 06/28/2020 1010   APPEARANCEUR HAZY (A) 06/28/2020 1010   LABSPEC 1.019 06/28/2020 1010   PHURINE 5.0 06/28/2020 1010   GLUCOSEU NEGATIVE 06/28/2020 1010   HGBUR NEGATIVE 06/28/2020 1010   BILIRUBINUR NEGATIVE 06/28/2020 1010   KETONESUR NEGATIVE 06/28/2020 1010   PROTEINUR 100 (A) 06/28/2020 1010   UROBILINOGEN 0.2 07/05/2009 0331   NITRITE NEGATIVE 06/28/2020 1010   LEUKOCYTESUR NEGATIVE 06/28/2020 1010    Radiological Exams on Admission: DG Ankle Complete Right  Result Date: 08/25/2020 CLINICAL DATA:  Fall with ankle swelling. EXAM: RIGHT ANKLE - COMPLETE 3+ VIEW COMPARISON:  None. FINDINGS: Soft tissue swelling in the right ankle, particularly along the lateral aspect. The ankle is located but there is widening along the lateral ankle mortise with some tilting of the talus. Oblique image raises concern for a nondisplaced fracture involving the fibula at the level of the ankle joint. Prominent plantar calcaneal  spur. IMPRESSION: Probable nondisplaced fracture involving the distal fibula only seen on the oblique image. There is extensive soft tissue swelling along the lateral aspect of the ankle. Widening along the lateral aspect of the ankle mortise as described. Electronically Signed   By: Markus Daft M.D.   On: 08/25/2020 12:48   CT Head Wo  Contrast  Result Date: 08/25/2020 CLINICAL DATA:  Possible head trauma. Additional provided: Patient found down. Dizziness. On blood thinners. Rule out fracture. EXAM: CT HEAD WITHOUT CONTRAST CT CERVICAL SPINE WITHOUT CONTRAST TECHNIQUE: Multidetector CT imaging of the head and cervical spine was performed following the standard protocol without intravenous contrast. Multiplanar CT image reconstructions of the cervical spine were also generated. COMPARISON:  Prior head CT examinations 07/31/2020 and earlier. CT of the cervical spine 02/07/2020. Thyroid ultrasound 07/10/2020. FINDINGS: CT HEAD FINDINGS Brain: Mildly motion degraded exam. Moderate generalized cerebral atrophy. A small volume acute hemorrhage within the atrium of the left lateral ventricle (series 4, image 15). Patchy and confluent hypoattenuation within the cerebral white matter, nonspecific but compatible with chronic small vessel ischemic disease. Redemonstrated small chronic lacunar infarct within the right cerebellar hemisphere (series 4, image 8). No demarcated cortical infarct. No extra-axial fluid collection. No evidence of intracranial mass. No midline shift. Vascular: No hyperdense vessel.  Atherosclerotic calcifications. Skull: Normal. Negative for fracture or focal suspicious osseous lesion. Sinuses/Orbits: Visualized orbits show no acute finding. Mild bilateral ethmoid sinus mucosal thickening. Trace left sphenoid sinus mucosal thickening. Other: Right mastoid effusion. Unchanged 1 cm right parotid nodule, suspicious for primary parotid neoplasm. CT CERVICAL SPINE FINDINGS Alignment: No significant  spondylolisthesis. Partially imaged thoracic dextrocurvature. Skull base and vertebrae: The basion-dental and atlanto-dental intervals are maintained.No evidence of acute fracture to the cervical spine. Soft tissues and spinal canal: No prevertebral fluid or swelling. No visible canal hematoma. Disc levels: Cervical spondylosis with multilevel disc space narrowing, disc bulges, central disc protrusions, uncovertebral hypertrophy and facet arthrosis. No appreciable high-grade spinal canal stenosis. Multilevel bony neural foraminal narrowing. Upper chest: No consolidation within the imaged lung apices. No visible pneumothorax. Other: Redemonstrated 2 cm calcified right thyroid lobe nodule, by report previously biopsied on 07/10/2020. Correlate with any available pathology. CT head impression #2 called by telephone at the time of interpretation on 08/25/2020 at 11:19 am to provider MARGAUX VENTER , who verbally acknowledged these results. IMPRESSION: CT head: 1. Mildly motion degraded exam. 2. Small-volume acute hemorrhage within the atrium of the left lateral ventricle. 3. Moderate/severe cerebral white matter chronic small vessel ischemic disease. 4. Redemonstrated small chronic lacunar infarct within the right cerebellar hemisphere. 5. Moderate cerebral atrophy with comparatively mild cerebellar atrophy. 6. Mild paranasal sinus disease at the imaged levels. 7. Right mastoid effusion. 8. Unchanged 1 cm right parotid nodule suspicious for primary parotid neoplasm. CT cervical spine: 1. No evidence of acute fracture in cervical spine. 2. Cervical spondylosis, as described. Electronically Signed   By: Kellie Simmering DO   On: 08/25/2020 11:22   CT Cervical Spine Wo Contrast  Result Date: 08/25/2020 CLINICAL DATA:  Possible head trauma. Additional provided: Patient found down. Dizziness. On blood thinners. Rule out fracture. EXAM: CT HEAD WITHOUT CONTRAST CT CERVICAL SPINE WITHOUT CONTRAST TECHNIQUE: Multidetector CT  imaging of the head and cervical spine was performed following the standard protocol without intravenous contrast. Multiplanar CT image reconstructions of the cervical spine were also generated. COMPARISON:  Prior head CT examinations 07/31/2020 and earlier. CT of the cervical spine 02/07/2020. Thyroid ultrasound 07/10/2020. FINDINGS: CT HEAD FINDINGS Brain: Mildly motion degraded exam. Moderate generalized cerebral atrophy. A small volume acute hemorrhage within the atrium of the left lateral ventricle (series 4, image 15). Patchy and confluent hypoattenuation within the cerebral white matter, nonspecific but compatible with chronic small vessel ischemic disease. Redemonstrated small chronic lacunar infarct within the right cerebellar hemisphere (series 4, image  8). No demarcated cortical infarct. No extra-axial fluid collection. No evidence of intracranial mass. No midline shift. Vascular: No hyperdense vessel.  Atherosclerotic calcifications. Skull: Normal. Negative for fracture or focal suspicious osseous lesion. Sinuses/Orbits: Visualized orbits show no acute finding. Mild bilateral ethmoid sinus mucosal thickening. Trace left sphenoid sinus mucosal thickening. Other: Right mastoid effusion. Unchanged 1 cm right parotid nodule, suspicious for primary parotid neoplasm. CT CERVICAL SPINE FINDINGS Alignment: No significant spondylolisthesis. Partially imaged thoracic dextrocurvature. Skull base and vertebrae: The basion-dental and atlanto-dental intervals are maintained.No evidence of acute fracture to the cervical spine. Soft tissues and spinal canal: No prevertebral fluid or swelling. No visible canal hematoma. Disc levels: Cervical spondylosis with multilevel disc space narrowing, disc bulges, central disc protrusions, uncovertebral hypertrophy and facet arthrosis. No appreciable high-grade spinal canal stenosis. Multilevel bony neural foraminal narrowing. Upper chest: No consolidation within the imaged lung  apices. No visible pneumothorax. Other: Redemonstrated 2 cm calcified right thyroid lobe nodule, by report previously biopsied on 07/10/2020. Correlate with any available pathology. CT head impression #2 called by telephone at the time of interpretation on 08/25/2020 at 11:19 am to provider MARGAUX VENTER , who verbally acknowledged these results. IMPRESSION: CT head: 1. Mildly motion degraded exam. 2. Small-volume acute hemorrhage within the atrium of the left lateral ventricle. 3. Moderate/severe cerebral white matter chronic small vessel ischemic disease. 4. Redemonstrated small chronic lacunar infarct within the right cerebellar hemisphere. 5. Moderate cerebral atrophy with comparatively mild cerebellar atrophy. 6. Mild paranasal sinus disease at the imaged levels. 7. Right mastoid effusion. 8. Unchanged 1 cm right parotid nodule suspicious for primary parotid neoplasm. CT cervical spine: 1. No evidence of acute fracture in cervical spine. 2. Cervical spondylosis, as described. Electronically Signed   By: Kellie Simmering DO   On: 08/25/2020 11:22    EKG: Independently reviewed.  Chronic A. fib, no PR or QTC issue.  Assessment/Plan Active Problems:   Intracranial hemorrhage ([place])  (please populate well all problems here in Problem List. (For example, if patient is on BP meds at home and you resume or decide to hold them, it is a problem that needs to be her. Same for CAD, COPD, HLD and so on)  Acute intracranial bleed, left lateral ventricle -Likely secondary to mechanical fall and systemic anticoagulation -Last dose of Eliquis was yesterday, no Kcentra indicated.  Both neurosurgery and neurology consulted and recommend blood pressure control and hold Eliquis. -POA/sister-in-law reported patient has had multiple near fall this year, or prevented by nursing home staff.  However given this episode of intracranial bleed, I would recommend to discontinue Eliquis to prevent future fall and bleeding  event.  HTN uncontrolled -Symptoms related unable to take her BP meds since yesterday.  Was on nifedipine drip briefly and then blood pressure came down.  Resume p.o. BP meds and monitor.  Chronic A. Fib -Rate controlled, discontinue Eliquis, consider start aspirin tomorrow after CT head if bleeding stable.  IDDM uncontrolled -Check A1c, start sliding scale.  Right distal tibia fracture -Oncall [facility] Dr. Stann Mainland reviewed xray and recommends Cam Boot and weight bearing as tolerated.  CKD stage III -Stable  Heart murmur -Check Echo  DVT prophylaxis: SCD Code Status: Full Code Family Communication: Sister in law Disposition Plan: Expect 1 to 2 days hospital stay to make sure intracranial bleed stabilized and then PT evaluation. Consults called: Neurosurgery, Ortho Admission status: Tele admit   Lequita Halt MD [facility] Pager [phone]  08/25/2020, 2:09 PM

## 2020-08-25 NOTE — Progress Notes (Signed)
Orthopedic Tech Progress Note Patient Details:  Diana [name] 01-Apr-1933 [id number] Level 2 trauma  Patient ID: Diana [name], female   DOB: [birth year], 87 y.o.   MRN: [id number]  Diana [name] 08/25/2020, 12:25 PM

## 2020-08-25 NOTE — ED Provider Notes (Signed)
[facility] EMERGENCY DEPARTMENT Provider Note   CSN: [id number] Arrival date & time: 08/25/20  0901     History Chief Complaint  Patient presents with   Lytle Michaels    Diana [name] is a 87 y.o. female past medical history of A. fib on Eliquis, breast cancer status post bilateral mastectomy, CKD, dementia, type 2 diabetes, presenting from [facility] care unit after unwitnessed fall.  Patient was reportedly found on the floor in the "supine position" today, down for unknown amount of time.  Patient is unable to recall the event due to her baseline dementia.  Patient does not recall hitting her head or why she was on the floor.  She is having some soreness to the right side of her neck and her right ankle though otherwise no head pain.  She is not nauseous.  She is not having chest pain or shortness of breath, no abdominal pain, no hip pain or pain to her upper extremities or left lower extremity.  She is on anticoagulation, Eliquis, for A. fib.   CT scan was obtained in triage of head and C-spine.  C-spine is negative, head CT is positive for small volume acute hemorrhage of the left ventricle, therefore she was roomed in acute bed.   Spoke with patient's medical POA over the phone, sister-in-law Ann Callicott.  She states regarding patient's mental baseline, she is able to carry conversation with you, however is for example "unable to tell you what she had for breakfast today."  The history is provided by the patient and a relative.      Past Medical History:  Diagnosis Date   Anemia    Anemia of decreased vitamin B12 absorption 05/26/2005   Arthritis    Atrial fibrillation ([place])    Breast cancer ([place]) 2001   Cataract    GERD (gastroesophageal reflux disease)    Hiatal hernia    History of tobacco abuse    Hx of colonic polyps    Hyperlipidemia    Hypertension    Hypothyroidism    Memory loss    Obesity    OSA on CPAP 09/2007   AHI 10.6/hr  overall, 43.64/hr during REM, lost weight, does not use cpap   Osteopenia    Renal insufficiency    Type 2 diabetes mellitus [facility])     Patient Active Problem List   Diagnosis Date Noted   Intracranial hemorrhage ([place]) 08/25/2020   Generalized weakness 06/30/2020   CKD (chronic kidney disease) stage 3, GFR 30-59 ml/min ([place]) 06/30/2020   Dyspnea 06/30/2020   Influenza A 06/30/2020   Closed dislocation of left elbow 03/19/2020   Depression, recurrent ([place]) 01/21/2019   Hyperlipidemia associated with type 2 diabetes mellitus ([place]) 03/12/7251   Acute metabolic encephalopathy 66/44/0347   Fall 01/11/2019   Klebsiella UTI (urinary tract infection) 01/11/2019   Acute urinary retention 01/11/2019   Left kidney lesion 01/11/2019   Ulcerative esophagitis 01/11/2019   Persistent atrial fibrillation ([place])    Anemia 01/01/2019   Hiatal hernia    Symptomatic anemia 12/31/2018   Hypoglycemia 12/31/2018   Ribs, multiple fractures 12/31/2018   HTN (hypertension) 05/15/2013   OSA on CPAP 05/15/2013   Hypothyroidism 05/15/2013   Secondary DM with CKD stage 3 and hypertension ([place]) 05/15/2013   Lower extremity edema 05/15/2013   Renal insufficiency 05/15/2013   Morbid obesity ([place]) 05/15/2013   Breast cancer ([place]) 03/18/2011    Past Surgical History:  Procedure Laterality Date   BACK  SURGERY  05/30/2009   BIOPSY  01/01/2019   Procedure: BIOPSY;  Surgeon: Milus Banister, MD;  Location: [place];  Service: Endoscopy;;   BOWEL RESECTION  1974   CATARACT EXTRACTION Bilateral    bilateral   CHOLECYSTECTOMY  1974   COLONOSCOPY     ESOPHAGOGASTRODUODENOSCOPY N/A 01/01/2019   Procedure: ESOPHAGOGASTRODUODENOSCOPY (EGD);  Surgeon: Milus Banister, MD;  [facility] ENDOSCOPY;  Service: Endoscopy;  Laterality: N/A;   EXTERNAL FIXATION REMOVAL Left 04/27/2020   Procedure: REMOVAL EXTERNAL FIXATION ARM;  Surgeon: Shona Needles, MD;  Location: [place];  Service: Orthopedics;  Laterality: Left;    MASTECTOMY Bilateral 2001   bilateral sentinel lymph nodes bio   NM MYOCAR PERF WALL MOTION  06/2007   dipyridamole; perfusion defect in inferior myocardium consistent with diaphragmatic attenuation, remaining myocardium with no ischemia/infarct, EF 73%; normal, low risk scan    ORIF ELBOW FRACTURE Left 03/21/2020   Procedure: OPEN REDUCTION INTERNAL FIXATION (ORIF) ELBOW/OLECRANON FRACTURE;  Surgeon: Shona Needles, MD;  Location: [place];  Service: Orthopedics;  Laterality: Left;   TOTAL ABDOMINAL HYSTERECTOMY  1975   TRANSTHORACIC ECHOCARDIOGRAM  02/2010   XI=>33%, stage 1 diastolic dysfunction; borderline RV enlargement; LA mild-mod dilated; mild mitral annular calcif & mild MR; mild TR with normal RSVP, AV moderately sclerotics     OB History   No obstetric history on file.     Family History  Problem Relation Age of Onset   Heart attack Mother    Heart attack Father    Colon cancer Neg Hx     Social History   Tobacco Use   Smoking status: Former    Years: 13.00    Pack years: 0.00    Types: Cigarettes    Quit date: 05/02/2002    Years since quitting: 18.3   Smokeless tobacco: Never  Vaping Use   Vaping Use: Never used  Substance Use Topics   Alcohol use: No    Alcohol/week: 0.0 standard drinks   Drug use: Never    Home Medications Prior to Admission medications   Medication Sig Start Date End Date Taking? Authorizing Provider  amLODipine (NORVASC) 10 MG tablet Take 1 tablet (10 mg total) by mouth daily. 05/27/20   Ngetich, Dinah C, NP  apixaban (ELIQUIS) 2.5 MG TABS tablet Take 1 tablet (2.5 mg total) by mouth 2 (two) times daily. 05/26/20   Medina-Vargas, Monina C, NP  brimonidine-timolol (COMBIGAN) 0.2-0.5 % ophthalmic solution Place 1 drop into both eyes every 12 (twelve) hours. 05/26/20   Medina-Vargas, Monina C, NP  ferrous sulfate 325 (65 FE) MG tablet Take 1 tablet (325 mg total) by mouth 2 (two) times daily with a meal. Patient taking differently: Take 325 mg  by mouth every morning. 04/14/20   Medina-Vargas, Monina C, NP  FLUoxetine (PROZAC) 20 MG capsule Take 1 capsule (20 mg total) by mouth daily. 05/26/20   Medina-Vargas, Monina C, NP  insulin glargine (LANTUS) 100 UNIT/ML Solostar Pen Inject 7 Units into the skin at bedtime. 07/06/20   Caren Griffins, MD  levothyroxine (SYNTHROID) 50 MCG tablet Take 1 tablet (50 mcg total) by mouth daily. 05/26/20   Medina-Vargas, Monina C, NP  losartan (COZAAR) 50 MG tablet Take 1 tablet (50 mg total) by mouth 2 (two) times daily. 05/26/20   Medina-Vargas, Monina C, NP  metFORMIN (GLUCOPHAGE) 500 MG tablet Take 500 mg by mouth daily with breakfast.    [provider]  metoprolol tartrate (LOPRESSOR) 25 MG tablet  Take 0.5 tablets (12.5 mg total) by mouth 2 (two) times daily. 05/26/20   Medina-Vargas, Monina C, NP  Multiple Vitamins-Minerals (PRESERVISION AREDS 2 PO) Take 1 capsule by mouth every morning.    [provider]  pantoprazole (PROTONIX) 40 MG tablet Take 1 tablet (40 mg total) by mouth 2 (two) times daily before a meal. 05/26/20   Medina-Vargas, Monina C, NP  rosuvastatin (CRESTOR) 10 MG tablet Take 10 mg by mouth at bedtime.    [provider]    Allergies    Codeine sulfate, Sps [sodium polystyrene sulfonate], and Sulfa antibiotics  Review of Systems   Review of Systems  All other systems reviewed and are negative.  Physical Exam Updated Vital Signs BP 134/83   Pulse 95   Temp 98.2 F (36.8 C) (Oral)   Resp 15   Ht 5\' 6"  (1.676 m)   Wt 86.1 kg   LMP  (LMP Unknown)   SpO2 97%   BMI 30.64 kg/m   Physical Exam Vitals and nursing note reviewed.  Constitutional:      Appearance: She is well-developed.  HENT:     Head: Normocephalic.     Comments: There is some slight redness to the occipital scalp without large hematoma or wound.  There is also some tenderness in this area. Eyes:     Conjunctiva/sclera: Conjunctivae normal.  Neck:     Comments: C-collar in place  upon evaluation.  This is cleared with negative CT C-spine done prior to evaluation.  Patient is able to actively range the neck in all directions.  Has some soreness to the right lateral neck, no posterior or midline C-spine pain Cardiovascular:     Rate and Rhythm: Normal rate. Rhythm irregular.  Pulmonary:     Effort: Pulmonary effort is normal. No respiratory distress.     Breath sounds: Normal breath sounds.  Abdominal:     General: Bowel sounds are normal.     Palpations: Abdomen is soft.     Tenderness: There is no abdominal tenderness.  Musculoskeletal:     Cervical back: Normal range of motion and neck supple.     Comments: Upper extremities are atraumatic without tenderness and normal active range of motion.   Pelvis is stable.  No pain in bilateral hips with range of motion. Bilateral knees without tenderness.  Right ankle with swelling compared to the left and tenderness to the lateral aspect.  No deformity or wounds    Skin:    General: Skin is warm.  Neurological:     Mental Status: She is alert. Mental status is at baseline.     Comments: Patient is able to tell me who she is and that she is at the hospital.  She is unable to recall the fall or what happened this morning.  She is able to name her sister-in-law and described that she is her POA. Cranial nerves are intact.  Speech is fluent without aphasia.  She is following simple commands without difficulty.  Equal strength and sensation to all 4 extremities.  Normal tone.  Normal coordination with finger-nose bilaterally.  Gait deferred due to ankle pain.  Psychiatric:        Behavior: Behavior normal.    ED Results / Procedures / Treatments   Labs (all labs ordered are listed, but only abnormal results are displayed) Labs Reviewed  COMPREHENSIVE METABOLIC PANEL - Abnormal; Notable for the following components:      Result Value   Glucose, Bld 222 (*)  Creatinine, Ser 1.24 (*)    Albumin 3.2 (*)    Total  Bilirubin 1.5 (*)    GFR, Estimated 43 (*)    All other components within normal limits  CBC WITH DIFFERENTIAL/PLATELET - Abnormal; Notable for the following components:   Neutro Abs 8.8 (*)    Lymphs Abs 0.6 (*)    All other components within normal limits  CK - Abnormal; Notable for the following components:   Total CK 30 (*)    All other components within normal limits  RESP PANEL BY RT-PCR (FLU A&B, COVID) [id number]  URINALYSIS, ROUTINE W REFLEX MICROSCOPIC    EKG None  Radiology DG Ankle Complete Right  Result Date: 08/25/2020 CLINICAL DATA:  Fall with ankle swelling. EXAM: RIGHT ANKLE - COMPLETE 3+ VIEW COMPARISON:  None. FINDINGS: Soft tissue swelling in the right ankle, particularly along the lateral aspect. The ankle is located but there is widening along the lateral ankle mortise with some tilting of the talus. Oblique image raises concern for a nondisplaced fracture involving the fibula at the level of the ankle joint. Prominent plantar calcaneal spur. IMPRESSION: Probable nondisplaced fracture involving the distal fibula only seen on the oblique image. There is extensive soft tissue swelling along the lateral aspect of the ankle. Widening along the lateral aspect of the ankle mortise as described. Electronically Signed   By: Markus Daft M.D.   On: 08/25/2020 12:48   CT Head Wo Contrast  Result Date: 08/25/2020 CLINICAL DATA:  Possible head trauma. Additional provided: Patient found down. Dizziness. On blood thinners. Rule out fracture. EXAM: CT HEAD WITHOUT CONTRAST CT CERVICAL SPINE WITHOUT CONTRAST TECHNIQUE: Multidetector CT imaging of the head and cervical spine was performed following the standard protocol without intravenous contrast. Multiplanar CT image reconstructions of the cervical spine were also generated. COMPARISON:  Prior head CT examinations 07/31/2020 and earlier. CT of the cervical spine 02/07/2020. Thyroid ultrasound 07/10/2020. FINDINGS: CT HEAD FINDINGS Brain:  Mildly motion degraded exam. Moderate generalized cerebral atrophy. A small volume acute hemorrhage within the atrium of the left lateral ventricle (series 4, image 15). Patchy and confluent hypoattenuation within the cerebral white matter, nonspecific but compatible with chronic small vessel ischemic disease. Redemonstrated small chronic lacunar infarct within the right cerebellar hemisphere (series 4, image 8). No demarcated cortical infarct. No extra-axial fluid collection. No evidence of intracranial mass. No midline shift. Vascular: No hyperdense vessel.  Atherosclerotic calcifications. Skull: Normal. Negative for fracture or focal suspicious osseous lesion. Sinuses/Orbits: Visualized orbits show no acute finding. Mild bilateral ethmoid sinus mucosal thickening. Trace left sphenoid sinus mucosal thickening. Other: Right mastoid effusion. Unchanged 1 cm right parotid nodule, suspicious for primary parotid neoplasm. CT CERVICAL SPINE FINDINGS Alignment: No significant spondylolisthesis. Partially imaged thoracic dextrocurvature. Skull base and vertebrae: The basion-dental and atlanto-dental intervals are maintained.No evidence of acute fracture to the cervical spine. Soft tissues and spinal canal: No prevertebral fluid or swelling. No visible canal hematoma. Disc levels: Cervical spondylosis with multilevel disc space narrowing, disc bulges, central disc protrusions, uncovertebral hypertrophy and facet arthrosis. No appreciable high-grade spinal canal stenosis. Multilevel bony neural foraminal narrowing. Upper chest: No consolidation within the imaged lung apices. No visible pneumothorax. Other: Redemonstrated 2 cm calcified right thyroid lobe nodule, by report previously biopsied on 07/10/2020. Correlate with any available pathology. CT head impression #2 called by telephone at the time of interpretation on 08/25/2020 at 11:19 am to provider MARGAUX VENTER , who verbally acknowledged these results. IMPRESSION: CT  head: 1. Mildly  motion degraded exam. 2. Small-volume acute hemorrhage within the atrium of the left lateral ventricle. 3. Moderate/severe cerebral white matter chronic small vessel ischemic disease. 4. Redemonstrated small chronic lacunar infarct within the right cerebellar hemisphere. 5. Moderate cerebral atrophy with comparatively mild cerebellar atrophy. 6. Mild paranasal sinus disease at the imaged levels. 7. Right mastoid effusion. 8. Unchanged 1 cm right parotid nodule suspicious for primary parotid neoplasm. CT cervical spine: 1. No evidence of acute fracture in cervical spine. 2. Cervical spondylosis, as described. Electronically Signed   By: Kellie Simmering DO   On: 08/25/2020 11:22   CT Cervical Spine Wo Contrast  Result Date: 08/25/2020 CLINICAL DATA:  Possible head trauma. Additional provided: Patient found down. Dizziness. On blood thinners. Rule out fracture. EXAM: CT HEAD WITHOUT CONTRAST CT CERVICAL SPINE WITHOUT CONTRAST TECHNIQUE: Multidetector CT imaging of the head and cervical spine was performed following the standard protocol without intravenous contrast. Multiplanar CT image reconstructions of the cervical spine were also generated. COMPARISON:  Prior head CT examinations 07/31/2020 and earlier. CT of the cervical spine 02/07/2020. Thyroid ultrasound 07/10/2020. FINDINGS: CT HEAD FINDINGS Brain: Mildly motion degraded exam. Moderate generalized cerebral atrophy. A small volume acute hemorrhage within the atrium of the left lateral ventricle (series 4, image 15). Patchy and confluent hypoattenuation within the cerebral white matter, nonspecific but compatible with chronic small vessel ischemic disease. Redemonstrated small chronic lacunar infarct within the right cerebellar hemisphere (series 4, image 8). No demarcated cortical infarct. No extra-axial fluid collection. No evidence of intracranial mass. No midline shift. Vascular: No hyperdense vessel.  Atherosclerotic calcifications. Skull:  Normal. Negative for fracture or focal suspicious osseous lesion. Sinuses/Orbits: Visualized orbits show no acute finding. Mild bilateral ethmoid sinus mucosal thickening. Trace left sphenoid sinus mucosal thickening. Other: Right mastoid effusion. Unchanged 1 cm right parotid nodule, suspicious for primary parotid neoplasm. CT CERVICAL SPINE FINDINGS Alignment: No significant spondylolisthesis. Partially imaged thoracic dextrocurvature. Skull base and vertebrae: The basion-dental and atlanto-dental intervals are maintained.No evidence of acute fracture to the cervical spine. Soft tissues and spinal canal: No prevertebral fluid or swelling. No visible canal hematoma. Disc levels: Cervical spondylosis with multilevel disc space narrowing, disc bulges, central disc protrusions, uncovertebral hypertrophy and facet arthrosis. No appreciable high-grade spinal canal stenosis. Multilevel bony neural foraminal narrowing. Upper chest: No consolidation within the imaged lung apices. No visible pneumothorax. Other: Redemonstrated 2 cm calcified right thyroid lobe nodule, by report previously biopsied on 07/10/2020. Correlate with any available pathology. CT head impression #2 called by telephone at the time of interpretation on 08/25/2020 at 11:19 am to provider MARGAUX VENTER , who verbally acknowledged these results. IMPRESSION: CT head: 1. Mildly motion degraded exam. 2. Small-volume acute hemorrhage within the atrium of the left lateral ventricle. 3. Moderate/severe cerebral white matter chronic small vessel ischemic disease. 4. Redemonstrated small chronic lacunar infarct within the right cerebellar hemisphere. 5. Moderate cerebral atrophy with comparatively mild cerebellar atrophy. 6. Mild paranasal sinus disease at the imaged levels. 7. Right mastoid effusion. 8. Unchanged 1 cm right parotid nodule suspicious for primary parotid neoplasm. CT cervical spine: 1. No evidence of acute fracture in cervical spine. 2. Cervical  spondylosis, as described. Electronically Signed   By: Kellie Simmering DO   On: 08/25/2020 11:22    Procedures .Critical Care  Date/Time: 08/25/2020 2:43 PM Performed by: Lilyanna Lunt, [place] N, PA-C Authorized by: Jacquilyn Seldon, [place] N, PA-C   Critical care provider statement:    Critical care time (minutes):  45   Critical  care time was exclusive of:  Separately billable procedures and treating other patients and teaching time   Critical care was necessary to treat or prevent imminent or life-threatening deterioration of the following conditions:  CNS failure or compromise   Critical care was time spent personally by me on the following activities:  Discussions with consultants, evaluation of patient's response to treatment, examination of patient, ordering and performing treatments and interventions, ordering and review of laboratory studies, ordering and review of radiographic studies, pulse oximetry, re-evaluation of patient's condition, obtaining history from patient or surrogate and review of old charts   I assumed direction of critical care for this patient from another provider in my specialty: no     Medications Ordered in ED Medications  nicardipine (CARDENE) 20mg  in 0.86% saline 229ml IV infusion (0.1 mg/ml) (0 mg/hr Intravenous Stopped 08/25/20 1320)  amLODipine (NORVASC) tablet 10 mg (has no administration in time range)  losartan (COZAAR) tablet 50 mg (has no administration in time range)  metoprolol tartrate (LOPRESSOR) tablet 12.5 mg (has no administration in time range)  rosuvastatin (CRESTOR) tablet 10 mg (has no administration in time range)  FLUoxetine (PROZAC) capsule 20 mg (has no administration in time range)  levothyroxine (SYNTHROID) tablet 50 mcg (has no administration in time range)  metFORMIN (GLUCOPHAGE) tablet 500 mg (has no administration in time range)  pantoprazole (PROTONIX) EC tablet 40 mg (has no administration in time range)  ferrous sulfate tablet 325 mg (has no  administration in time range)  PreserVision AREDS 2 CAPS (has no administration in time range)  brimonidine-timolol (COMBIGAN) 0.2-0.5 % ophthalmic solution 1 drop (has no administration in time range)  sodium chloride flush (NS) 0.9 % injection 3 mL (has no administration in time range)  insulin aspart (novoLOG) injection 0-15 Units (has no administration in time range)    ED Course  I have reviewed the triage vital signs and the nursing notes.  Pertinent labs & imaging results that were available during my care of the patient were reviewed by me and considered in my medical decision making (see chart for details).  Clinical Course as of 08/25/20 1443  Sat Aug 25, 2020  1122 Received call from radiologist. Pt with hemorrhage to left lateral ventricle. Gilberto Better RN made aware. Pt to go back to room 6 [MV]  1200 Notified charge nurse and patient's nurse will upgrade to level 2 trauma. [JR]  1250 Discussed with neurologist Dr. Cheral Marker who reviewed CT imaging and suggests presentation and head CT findings favor traumatic hemorrhage over hemorrhagic stroke. [JR]  70 Consulted with Dr. Venetia Constable with neurosurgery.  He agrees with medical admission, monitoring.  Neurosurgery to consult. [JR]  1343 Dr. Roosevelt Locks accepting admission [JR]    Clinical Course User Index [JR] Marijean Montanye, [place] N, PA-C [MV] Eustaquio Maize, PA-C   MDM Rules/Calculators/A&P                          Patient is an 87 year old female on Eliquis for A. fib, presenting from memory care unit after unwitnessed fall sometime today.  She is at her mental baseline, confirmed by sister-in-law over the phone who is her medical POA.  Some redness is noted to the occipital scalp without large hematoma or wound.  She is also tender in this area.  CT scan of the head obtained in triage reveals small bleed in the left lateral ventricle.  This is upgraded to the level 2 trauma due to unwitnessed fall with  acute ICH on anticoagulation.  IV  nicardipine ordered for blood pressure management as it is significantly elevated on evaluation.    Consulted with neurology, favors traumatic hemorrhage.  Dr. Venetia Constable with neurosurgery agrees with medical admission and monitoring, neurosurgery to consult.   BP with good response to nicardipine.  As injury favors traumatic etiology, nicardipine discontinued.  She reported some pain to the right lateral ankle with some associated swelling.  Plain films reveal probable nondisplaced distal fib fracture with some widening of the ankle mortise.  She is placed in posterior short leg splint with stirrup.  Patient is admitted to the hospitalist service, Dr. Roosevelt Locks accepting admission.  Final Clinical Impression(s) / ED Diagnoses Final diagnoses:  Intracranial hemorrhage ([place])  Fall at nursing home, initial encounter  Other closed fracture of distal end of right fibula, initial encounter    Rx / DC Orders ED Discharge Orders     None        Jeffifer Rabold, [place] N, PA-C 08/25/20 1444    Blanchie Dessert, MD 08/26/20 [phone]

## 2020-08-25 NOTE — ED Triage Notes (Signed)
Patient arrived by [facility] from [facility]. Patient found on floor in supine position this am. Patient arrived with c-collar and denies pain. Patient on thinner and no abrasions nor hematomas noted to head. Patient alert

## 2020-08-25 NOTE — ED Notes (Signed)
Nurse report given to [employer], RN

## 2020-08-25 NOTE — Progress Notes (Signed)
   08/25/20 1205  Clinical Encounter Type  Visited With Patient not available  Visit Type Initial;Trauma  Referral From Nurse  Consult/Referral To Chaplain   Chaplain responded to Level 2 trauma. Pt being treated and no support person present. Chaplain not currently needed. Chaplain remains available.   This note was prepared by Chaplain Resident, Dante Gang, MDiv. Chaplain remains available as needed through the on-call pager: [phone].

## 2020-08-25 NOTE — Consult Note (Signed)
Neurosurgery Consultation   Reason for Consult: Intraventricular hemorrhage Referring Physician: Roosevelt Locks   CC: Intraventricular hemorrhage / fall   HPI: This is a 87 y.o. woman that presents after a fall. She has a history of MCI / dementia and does not recall the event and is unable to give any significant history, right now she states she feels good, no complaints. Per chart review, she was dizzy and fell down, found on the floor yesterday morning and was therefore taken to the ED for further evaluation. She denies any painful areas, but is wearing a boot on her right foot, chart review confirms she has a distal tibial frx. She was on apixiban for PAF, which was stopped after CTH showed IVH.     ROS: A 14 point ROS was performed and is negative except as noted in the HPI.   PMHx:      Past Medical History:  Diagnosis Date   Anemia     Anemia of decreased vitamin B12 absorption 05/26/2005   Arthritis     Atrial fibrillation (HCC)     Breast cancer ([place]) 2001   Cataract     GERD (gastroesophageal reflux disease)     Hiatal hernia     History of tobacco abuse     Hx of colonic polyps     Hyperlipidemia     Hypertension     Hypothyroidism     Memory loss     Obesity     OSA on CPAP 09/2007    AHI 10.6/hr overall, 43.64/hr during REM, lost weight, does not use cpap   Osteopenia     Renal insufficiency     Type 2 diabetes mellitus ([place])      FamHx:       Family History  Problem Relation Age of Onset   Heart attack Mother     Heart attack Father     Colon cancer Neg Hx      SocHx:  reports that she quit smoking about 18 years ago. Her smoking use included cigarettes. She has never used smokeless tobacco. She reports that she does not drink alcohol and does not use drugs.   Exam: Vital signs in last 24 hours: Temp:  [97.5 F (36.4 C)-98.4 F (36.9 C)] 98.4 F (36.9 C) (06/19 0407) Pulse Rate:  [35-100] 81 (06/18 2319) Resp:  [15-25] 22 (06/19 0407) BP: (92-198)/(55-122)  145/81 (06/19 0938) SpO2:  [91 %-100 %] 99 % (06/19 0407) Weight:  [86.1 kg] 86.1 kg (06/18 1225) General: Awake, alert, cooperative, lying in bed in NAD, currently getting her echo performed Head: Normocephalic and atruamatic HEENT: Neck supple Pulmonary: breathing room air comfortably, no evidence of increased work of breathing Cardiac: RRR Abdomen: S NT ND Extremities: Warm and well perfused x4, R foot in boot and therefore restricted ROM Neuro: Aox1 - doesn't know where she is or the current year, gaze neutral and conjugate, EOMI, FS, Fcx4 with equal strength but obviously limited at R ankle due to boot     Assessment and Plan: 87 y.o. woman s/p fall on eliquis. [place] personally reviewed, which shows small volume left atrial IVH with no evidence of acute ventriculomegaly / transependymal flow.   -no acute neurosurgical intervention indicated at this time -agree w/ holding eliquis, especially given fall risk. Can start prophylactic dose anticoagulation on 6/20 if indicated. -please call with any concerns or questions   Judith Part, MD 08/26/20 10:41 AM [facility]

## 2020-08-26 ENCOUNTER — Other Ambulatory Visit (HOSPITAL_COMMUNITY): Payer: Medicare PPO

## 2020-08-26 ENCOUNTER — Inpatient Hospital Stay (HOSPITAL_COMMUNITY): Payer: Medicare PPO

## 2020-08-26 DIAGNOSIS — R011 Cardiac murmur, unspecified: Secondary | ICD-10-CM

## 2020-08-26 DIAGNOSIS — Y92129 Unspecified place in nursing home as the place of occurrence of the external cause: Secondary | ICD-10-CM | POA: Diagnosis not present

## 2020-08-26 DIAGNOSIS — W19XXXA Unspecified fall, initial encounter: Secondary | ICD-10-CM

## 2020-08-26 DIAGNOSIS — I629 Nontraumatic intracranial hemorrhage, unspecified: Secondary | ICD-10-CM | POA: Diagnosis not present

## 2020-08-26 LAB — ECHOCARDIOGRAM COMPLETE
AR max vel: 1.32 cm2
AV Area VTI: 1.56 cm2
AV Area mean vel: 1.45 cm2
AV Mean grad: 8 mmHg
AV Peak grad: 15.1 mmHg
Ao pk vel: 1.94 m/s
Area-P 1/2: 3.54 cm2
Height: 66 in
S' Lateral: 2.8 cm
Weight: 3037.06 oz

## 2020-08-26 LAB — GLUCOSE, CAPILLARY
Glucose-Capillary: 326 mg/dL — ABNORMAL HIGH (ref 70–99)
Glucose-Capillary: 143 mg/dL — ABNORMAL HIGH (ref 70–99)
Glucose-Capillary: 131 mg/dL — ABNORMAL HIGH (ref 70–99)

## 2020-08-26 MED ORDER — INSULIN ASPART 100 UNIT/ML IJ SOLN
3.0000 [IU] | Freq: Three times a day (TID) | INTRAMUSCULAR | Status: DC
  Administered 2020-08-26 – 2020-08-28 (×7): 3 [IU] via SUBCUTANEOUS

## 2020-08-26 MED ORDER — INSULIN ASPART 100 UNIT/ML IJ SOLN
0.0000 [IU] | Freq: Three times a day (TID) | INTRAMUSCULAR | Status: DC
  Administered 2020-08-26: 11 [IU] via SUBCUTANEOUS
  Administered 2020-08-26: 2 [IU] via SUBCUTANEOUS
  Administered 2020-08-27: 3 [IU] via SUBCUTANEOUS
  Administered 2020-08-27: 5 [IU] via SUBCUTANEOUS
  Administered 2020-08-27 – 2020-08-28 (×2): 3 [IU] via SUBCUTANEOUS
  Administered 2020-08-28: 8 [IU] via SUBCUTANEOUS
  Administered 2020-08-29: 3 [IU] via SUBCUTANEOUS

## 2020-08-26 MED ORDER — INSULIN ASPART 100 UNIT/ML IJ SOLN: 0.0000 [IU] | Freq: Every day | INTRAMUSCULAR | Status: DC

## 2020-08-26 NOTE — Evaluation (Signed)
Physical Therapy Evaluation Patient Details Name: Diana [name] MRN: [id number] DOB: [birth year] Today's Date: 08/26/2020   History of Present Illness  87 y.o. female presents with fall.  Pt's records from ALF and family report mutliple falls.  CT of the head shows small volume acute hemorrhage within the right atrium of the left ventricle, small chronic lacunar infarct on the right.  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  Pneoplasm. CT cervical C-spine: negative. Pt with R dital fibular fx.  PMHx: ambulatory dysfunction with frequent falls, paroxysmal atrial fibrillation on Eliquis, mild dementia, IDDM, chronic kidney disease stage III  Clinical Impression  Pt admitted with/for fall, with R distal fibular fx and testing as described above.  Pt needing supervision/min guard for mobility OOB at this time and should have supervision in the memory care unit initially until she is determined to be at [facility] to baseline function..  Pt currently limited functionally due to the problems listed. ( See problems list.)   Pt will benefit from PT to maximize function and safety in order to get ready for next venue listed below.     Follow Up Recommendations No PT follow up;Supervision for mobility/OOB;Supervision/Assistance - 24 hour    Equipment Recommendations  Other (comment) (TBA)    Recommendations for Other Services       Precautions / Restrictions Precautions Precautions: Fall;Other (comment) Precaution Comments: wear CAM boot for WBAT Required Braces or Orthoses: Other Brace Restrictions Weight Bearing Restrictions: Yes RLE Weight Bearing: Weight bearing as tolerated Other Position/Activity Restrictions: WBAT with Cam walker boot.      Mobility  Bed Mobility Overal bed mobility: Needs Assistance Bed Mobility: Supine to Sit;Sit to Supine     Supine to sit: Modified independent (Device/Increase time) Sit to supine: Modified independent  (Device/Increase time)        Transfers Overall transfer level: Needs assistance   Transfers: Sit to/from Stand Sit to Stand: Supervision         General transfer comment: cues for safe hand placement  Ambulation/Gait Ambulation/Gait assistance: Min guard Gait Distance (Feet): 50 Feet Assistive device: Rolling walker (2 wheeled) Gait Pattern/deviations: Step-through pattern Gait velocity: slower Gait velocity interpretation: <1.8 ft/sec, indicate of risk for recurrent falls General Gait Details: mildly unsteady with camwalker on the R LE, no pain, but mildly uncoordinated with the cam walker boot.  Stairs            Wheelchair Mobility    Modified Rankin (Stroke Patients Only)       Balance Overall balance assessment: Needs assistance Sitting-balance support: No upper extremity supported;Bilateral upper extremity supported;Feet supported Sitting balance-Leahy Scale: Fair     Standing balance support: Bilateral upper extremity supported Standing balance-Leahy Scale: Poor Standing balance comment: reliant on AD for safety as of this evaluation                             Pertinent Vitals/Pain Pain Assessment: Faces Faces Pain Scale: No hurt Pain Intervention(s): Monitored during session    Home Living Family/patient expects to be discharged to:: Skilled nursing facility                 Additional Comments: memory care unit of [facility]    Prior Function Level of Independence: Needs assistance   Gait / Transfers Assistance Needed: Pt reports using RW for mobility  ADL's / Homemaking Assistance Needed: pt requires assistance for ADLs  Hand Dominance   Dominant Hand: Right    Extremity/Trunk Assessment   Upper Extremity Assessment Upper Extremity Assessment: Overall WFL for tasks assessed    Lower Extremity Assessment Lower Extremity Assessment: Overall WFL for tasks assessed       Communication    Communication: No difficulties  Cognition Arousal/Alertness: Awake/alert Behavior During Therapy: WFL for tasks assessed/performed Overall Cognitive Status: History of cognitive impairments - at baseline                                 General Comments: dementia as baseline. A lot of "I don't know" for answers.      General Comments      Exercises     Assessment/Plan    PT Assessment Patient needs continued PT services  PT Problem List Decreased activity tolerance;Decreased balance;Decreased mobility;Decreased strength;Decreased knowledge of use of DME       PT Treatment Interventions DME instruction;Gait training;Stair training;Functional mobility training;Patient/family education;Balance training;Therapeutic activities    PT Goals (Current goals can be found in the Care Plan section)  Acute Rehab PT Goals Patient Stated Goal: I don't know PT Goal Formulation: Patient unable to participate in goal setting Time For Goal Achievement: 09/02/20 Potential to Achieve Goals: Good    Frequency Min 2X/week   Barriers to discharge        Co-evaluation               AM-PAC PT "6 Clicks" Mobility  Outcome Measure Help needed turning from your back to your side while in a flat bed without using bedrails?: A Little Help needed moving from lying on your back to sitting on the side of a flat bed without using bedrails?: A Little Help needed moving to and from a bed to a chair (including a wheelchair)?: A Little Help needed standing up from a chair using your arms (e.g., wheelchair or bedside chair)?: A Little Help needed to walk in hospital room?: A Little Help needed climbing 3-5 steps with a railing? : A Little 6 Click Score: 18    End of Session   Activity Tolerance: Patient tolerated treatment well Patient left: in bed;with call bell/phone within reach;with bed alarm set Nurse Communication: Mobility status PT Visit Diagnosis: Other abnormalities of gait  and mobility (R26.89);Difficulty in walking, not elsewhere classified (R26.2)    Time: 1610-9604 PT Time Calculation (min) (ACUTE ONLY): 27 min   Charges:   PT Evaluation $PT Eval Moderate Complexity: 1 Mod PT Treatments $Gait Training: 8-22 mins        08/26/2020  Ginger Carne., PT Acute Rehabilitation Services [phone]  (pager) [phone]  (office)  Tessie Fass Rupa Lagan 08/26/2020, 1:33 PM

## 2020-08-26 NOTE — Evaluation (Signed)
Occupational Therapy Evaluation Patient Details Name: Diana [name] MRN: [id number] DOB: 1933/07/10 Today's Date: 08/26/2020    History of Present Illness 87 y.o. female presents with fall.  Pt's records from ALF and family report mutliple falls.  CT of the head shows small volume acute hemorrhage within the right atrium of the left ventricle, small chronic lacunar infarct on the right.  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  Pneoplasm. CT cervical C-spine: negative. Pt with R dital fibular fx.  PMHx: ambulatory dysfunction with frequent falls, paroxysmal atrial fibrillation on Eliquis, mild dementia, IDDM, chronic kidney disease stage III   Clinical Impression   Pt PTA: Pt living at ALF/memory care unit. Pt currently, Pt required assist from staff at ALF/memory care unit per chart. Pt can perform self feed and grooming after set-upA. CAM boot delivered after OT Eval so NWB on RLE achieved due to only short splint on with RW for transfers to and from [facility] and recliner. Pt set-upA to [place] for ADL and minA overall for transfers. Pt able to follow commands well, but unable to verbalize and PLOF. Pt would benefit from continued OT skilled services. OT following acutely.    Follow Up Recommendations  Supervision/Assistance - 24 hour (return to ALF/[facility])    Equipment Recommendations  None recommended by OT    Recommendations for Other Services       Precautions / Restrictions Precautions Precautions: Fall;Other (comment) Precaution Comments: wear CAM boot for WBAT Required Braces or Orthoses: Other Brace Restrictions Weight Bearing Restrictions: Yes RLE Weight Bearing: Weight bearing as tolerated Other Position/Activity Restrictions: WBAT with Cam walker boot.      Mobility Bed Mobility Overal bed mobility: Needs Assistance Bed Mobility: Supine to Sit;Sit to Supine     Supine to sit: Modified independent (Device/Increase  time) Sit to supine: Modified independent (Device/Increase time)        Transfers Overall transfer level: Needs assistance   Transfers: Sit to/from Stand Sit to Stand: Min guard         General transfer comment: cues for safe hand placement with RW    Balance Overall balance assessment: Needs assistance Sitting-balance support: No upper extremity supported;Bilateral upper extremity supported;Feet supported Sitting balance-Leahy Scale: Fair     Standing balance support: Bilateral upper extremity supported Standing balance-Leahy Scale: Poor Standing balance comment: reliant on AD for safety                           ADL either performed or assessed with clinical judgement   ADL Overall ADL's : At baseline                                     Functional mobility during ADLs: Minimal assistance;Rolling walker;Cueing for safety;Cueing for sequencing General ADL Comments: Pt required assist from staff at ALF/memory care unit per chart. Pt can perform self feed and grooming after set-upA.     Vision Baseline Vision/History: No visual deficits Patient Visual Report: No change from baseline Vision Assessment?: No apparent visual deficits     Perception     Praxis      Pertinent Vitals/Pain Pain Assessment: 0-10 Faces Pain Scale: Hurts little more Pain Location: R foot and headache Pain Descriptors / Indicators: Discomfort Pain Intervention(s): Monitored during session;Premedicated before session;Repositioned     Hand Dominance Right   Extremity/Trunk Assessment  Upper Extremity Assessment Upper Extremity Assessment: Overall WFL for tasks assessed   Lower Extremity Assessment Lower Extremity Assessment: Generalized weakness   Cervical / Trunk Assessment Cervical / Trunk Assessment: Kyphotic   Communication Communication Communication: No difficulties   Cognition Arousal/Alertness: Awake/alert Behavior During Therapy: WFL for tasks  assessed/performed Overall Cognitive Status: History of cognitive impairments - at baseline                                 General Comments: dementia as baseline. A lot of "I don't know" for answers.   General Comments  VSS on RA. Pt had not had CAM boot delivered, but Ortho tech called and came right away for PT eval to have boot on for mobility. Pt stayed NWB on RLE during OT eval for transfers from bed  <-> BSC and then BSC swapped with bed.    Exercises     Shoulder Instructions      Home Living Family/patient expects to be discharged to:: Skilled nursing facility                                 Additional Comments: memory care unit of [facility]      Prior Functioning/Environment Level of Independence: Needs assistance  Gait / Transfers Assistance Needed: Pt reports using RW for mobility ADL's / Homemaking Assistance Needed: pt requires assistance for ADLs            OT Problem List: Decreased strength;Decreased activity tolerance;Impaired balance (sitting and/or standing);Decreased cognition;Decreased safety awareness;Decreased knowledge of use of DME or AE;Decreased knowledge of precautions;Pain;Increased edema;Obesity      OT Treatment/Interventions: Self-care/ADL training;Therapeutic exercise;Energy conservation;DME and/or AE instruction;Therapeutic activities;Cognitive remediation/compensation;Patient/family education;Balance training    OT Goals(Current goals can be found in the care plan section) Acute Rehab OT Goals Patient Stated Goal: I don't know OT Goal Formulation: With patient Time For Goal Achievement: 09/09/20 Potential to Achieve Goals: Good ADL Goals Pt Will Transfer to Toilet: with min guard assist;bedside commode;ambulating Additional ADL Goal #1: Pt will perform OOB ADL x4 mins of standing with 1 seated rest break to increase independence.  OT Frequency: Min 2X/week   Barriers to D/C:            Co-evaluation               AM-PAC OT "6 Clicks" Daily Activity     Outcome Measure Help from another person eating meals?: A Little Help from another person taking care of personal grooming?: A Little Help from another person toileting, which includes using toliet, bedpan, or urinal?: Total Help from another person bathing (including washing, rinsing, drying)?: A Lot Help from another person to put on and taking off regular upper body clothing?: A Little Help from another person to put on and taking off regular lower body clothing?: Total 6 Click Score: 13   End of Session Equipment Utilized During Treatment: Gait belt;Rolling walker Nurse Communication: Mobility status;Other (comment);Weight bearing status (Orth tech delivering CAM boot)  Activity Tolerance: Patient tolerated treatment well Patient left: in chair;with call bell/phone within reach;with chair alarm set  OT Visit Diagnosis: Unsteadiness on feet (R26.81);Muscle weakness (generalized) (M62.81);Pain;Other symptoms and signs involving cognitive function Pain - Right/Left: Right Pain - part of body: Ankle and joints of foot                Time:  6433-2951 OT Time Calculation (min): 51 min Charges:  OT General Charges $OT Visit: 1 Visit OT Evaluation $OT Eval Moderate Complexity: 1 Mod OT Treatments $Self Care/Home Management : 23-37 mins Jefferey Pica, OTR/L Acute Rehabilitation Services Pager: [phone] Office: [phone]   Sheala Dosh C 08/26/2020, 3:44 PM

## 2020-08-26 NOTE — Progress Notes (Signed)
Orthopedic Tech Progress Note Patient Details:  Diana [name] [birth year] [id number]  Splint was removed from RLE and replaced with a CAM walker boot. Skin appeared clean/dry with no wounds or areas of irritation from the splint.   Ortho Devices Type of Ortho Device: CAM walker Ortho Device/Splint Location: RLE Ortho Device/Splint Interventions: Ordered, Application, Adjustment   Post Interventions Patient Tolerated: Well Instructions Provided: Care of device  Christne Platts Jeri Modena 08/26/2020, 9:47 AM

## 2020-08-26 NOTE — Progress Notes (Deleted)
Neurosurgery Consultation  Reason for Consult: Intraventricular hemorrhage Referring Physician: Roosevelt Locks  CC: Intraventricular hemorrhage / fall  HPI: This is a 87 y.o. woman that presents after a fall. She has a history of MCI / dementia and does not recall the event and is unable to give any significant history, right now she states she feels good, no complaints. Per chart review, she was dizzy and fell down, found on the floor yesterday morning and was therefore taken to the ED for further evaluation. She denies any painful areas, but is wearing a boot on her right foot, chart review confirms she has a distal tibial frx. She was on apixiban for PAF, which was stopped after CTH showed IVH.   ROS: A 14 point ROS was performed and is negative except as noted in the HPI.   PMHx:  Past Medical History:  Diagnosis Date   Anemia    Anemia of decreased vitamin B12 absorption 05/26/2005   Arthritis    Atrial fibrillation (HCC)    Breast cancer ([place]) 2001   Cataract    GERD (gastroesophageal reflux disease)    Hiatal hernia    History of tobacco abuse    Hx of colonic polyps    Hyperlipidemia    Hypertension    Hypothyroidism    Memory loss    Obesity    OSA on CPAP 09/2007   AHI 10.6/hr overall, 43.64/hr during REM, lost weight, does not use cpap   Osteopenia    Renal insufficiency    Type 2 diabetes mellitus ([place])    FamHx:  Family History  Problem Relation Age of Onset   Heart attack Mother    Heart attack Father    Colon cancer Neg Hx    SocHx:  reports that she quit smoking about 18 years ago. Her smoking use included cigarettes. She has never used smokeless tobacco. She reports that she does not drink alcohol and does not use drugs.  Exam: Vital signs in last 24 hours: Temp:  [97.5 F (36.4 C)-98.4 F (36.9 C)] 98.4 F (36.9 C) (06/19 0407) Pulse Rate:  [35-100] 81 (06/18 2319) Resp:  [15-25] 22 (06/19 0407) BP: (92-198)/(55-122) 145/81 (06/19 0938) SpO2:  [91 %-100 %]  99 % (06/19 0407) Weight:  [86.1 kg] 86.1 kg (06/18 1225) General: Awake, alert, cooperative, lying in bed in NAD, currently getting her echo performed Head: Normocephalic and atruamatic HEENT: Neck supple Pulmonary: breathing room air comfortably, no evidence of increased work of breathing Cardiac: RRR Abdomen: S NT ND Extremities: Warm and well perfused x4, R foot in boot and therefore restricted ROM Neuro: Aox1 - doesn't know where she is or the current year, gaze neutral and conjugate, EOMI, FS, Fcx4 with equal strength but obviously limited at R ankle due to boot   Assessment and Plan: 87 y.o. woman s/p fall on eliquis. [place] personally reviewed, which shows small volume left atrial IVH with no evidence of acute ventriculomegaly / transependymal flow.   -no acute neurosurgical intervention indicated at this time -agree w/ holding eliquis, especially given fall risk. Can start prophylactic dose anticoagulation on 6/20 if indicated. -please call with any concerns or questions  87 y.o. woman 08/26/20 10:41 AM [facility]

## 2020-08-26 NOTE — TOC CAGE-AID Note (Signed)
Transition of Care [facility]) - CAGE-AID Screening   Patient Details  Name: Diana [name] MRN: [id number] Date of Birth: 1933-12-08   Clinical Narrative:  Patient with baseline dementia, oriented to person but unable to recall past events. Patient from [facility] memory care unit, documented no alcohol or drug use in history. Unable to complete cage-aid at this time.  CAGE-AID Screening: Substance Abuse Screening unable to be completed due to: : Patient unable to participate

## 2020-08-26 NOTE — Progress Notes (Signed)
[facility] PROGRESS NOTE    Progress Note  Diana [name]  NAT:[id number] DOB: 01/22/1934 DOA: 08/25/2020 PCP: Christain Sacramento, MD     Brief Narrative:   Diana [name] is an 87 y.o. female past medical history significant for ambulatory dysfunction with frequent falls, paroxysmal atrial fibrillation on Eliquis, mild dementia insulin-dependent diabetes mellitus, chronic kidney disease stage III presents with fall.  According to assisted living facility records the patient was found on the floor, according to his sister-in-law she has had several falls this year.    CT of the head shows small volume acute hemorrhage within the right atrium of the left ventricle, cerebral white matter chronic small vessel disease, small chronic lacunar infarct on the right.  Moderate cerebral atrophy.  And an unchanged 1 cm parotid nodule suspicious for primary parotid neoplasm   CT cervical C-spine: Showed no evidence of acute fracture.     Assessment/Plan:   Intracranial hemorrhage [facility]) Awaiting neurosurgery recommendations. CT of the head showed acute hemorrhage Eliquis was discontinued. Resume her antihypertensive medication.  Goal blood pressure less than 160/100. Candidate for Eliquis in the future.  Essential hypertension: She was restarted on Norvasc, hydralazine and losartan her blood pressure is at goal.  Chronic atrial fibrillation: Rate controlled, not a candidate for anticoagulation due to frequent falls.  Insulin-dependent diabetes mellitus uncontrolled A1c is pending blood glucose trending up we will start her on long-acting insulin continue sliding scale.  Right distal tibia fracture: X-ray of the ankle showed probable nondisplaced fracture involving the distal fibula and only seen in oblique images.  Extensive soft tissue swelling. Orthopedic physician reviewed the imaging recommended cam boot and weightbearing as tolerated follow-up with them as an  outpatient.  Chronic kidney disease stage III: Creatinine appears to be at baseline.  Heart murmur: Echo pending  DVT prophylaxis: scd Family Communication:none Status is: Inpatient  Remains inpatient appropriate because:Hemodynamically unstable  Dispo: The patient is from: ALF              Anticipated d/c is to: SNF              Patient currently is not medically stable to d/c.   Difficult to place patient No      Code Status:     Code Status Orders  (From admission, onward)           Start     Ordered   08/25/20 1354  Full code  Continuous        08/25/20 1356           Code Status History     Date Active Date Inactive Code Status Order ID Comments User Context   07/01/2020 0314 07/06/2020 1917 Full Code [id number]  Chotiner, Yevonne Aline, MD ED   03/21/2020 1438 03/28/2020 2355 Full Code [id number]  Vivien Rota Inpatient   12/31/2018 0316 01/07/2019 2130 Full Code [id number]  [name], MD ED         IV Access:   Peripheral IV   Procedures and diagnostic studies:   DG Ankle Complete Right  Result Date: 08/25/2020 CLINICAL DATA:  Fall with ankle swelling. EXAM: RIGHT ANKLE - COMPLETE 3+ VIEW COMPARISON:  None. FINDINGS: Soft tissue swelling in the right ankle, particularly along the lateral aspect. The ankle is located but there is widening along the lateral ankle mortise with some tilting of the talus. Oblique image raises concern for a nondisplaced fracture involving the fibula at the level of  the ankle joint. Prominent plantar calcaneal spur. IMPRESSION: Probable nondisplaced fracture involving the distal fibula only seen on the oblique image. There is extensive soft tissue swelling along the lateral aspect of the ankle. Widening along the lateral aspect of the ankle mortise as described. Electronically Signed   By: Markus Daft M.D.   On: 08/25/2020 12:48   CT Head Wo Contrast  Result Date: 08/25/2020 CLINICAL DATA:  Possible head  trauma. Additional provided: Patient found down. Dizziness. On blood thinners. Rule out fracture. EXAM: CT HEAD WITHOUT CONTRAST CT CERVICAL SPINE WITHOUT CONTRAST TECHNIQUE: Multidetector CT imaging of the head and cervical spine was performed following the standard protocol without intravenous contrast. Multiplanar CT image reconstructions of the cervical spine were also generated. COMPARISON:  Prior head CT examinations 07/31/2020 and earlier. CT of the cervical spine 02/07/2020. Thyroid ultrasound 07/10/2020. FINDINGS: CT HEAD FINDINGS Brain: Mildly motion degraded exam. Moderate generalized cerebral atrophy. A small volume acute hemorrhage within the atrium of the left lateral ventricle (series 4, image 15). Patchy and confluent hypoattenuation within the cerebral white matter, nonspecific but compatible with chronic small vessel ischemic disease. Redemonstrated small chronic lacunar infarct within the right cerebellar hemisphere (series 4, image 8). No demarcated cortical infarct. No extra-axial fluid collection. No evidence of intracranial mass. No midline shift. Vascular: No hyperdense vessel.  Atherosclerotic calcifications. Skull: Normal. Negative for fracture or focal suspicious osseous lesion. Sinuses/Orbits: Visualized orbits show no acute finding. Mild bilateral ethmoid sinus mucosal thickening. Trace left sphenoid sinus mucosal thickening. Other: Right mastoid effusion. Unchanged 1 cm right parotid nodule, suspicious for primary parotid neoplasm. CT CERVICAL SPINE FINDINGS Alignment: No significant spondylolisthesis. Partially imaged thoracic dextrocurvature. Skull base and vertebrae: The basion-dental and atlanto-dental intervals are maintained.No evidence of acute fracture to the cervical spine. Soft tissues and spinal canal: No prevertebral fluid or swelling. No visible canal hematoma. Disc levels: Cervical spondylosis with multilevel disc space narrowing, disc bulges, central disc protrusions,  uncovertebral hypertrophy and facet arthrosis. No appreciable high-grade spinal canal stenosis. Multilevel bony neural foraminal narrowing. Upper chest: No consolidation within the imaged lung apices. No visible pneumothorax. Other: Redemonstrated 2 cm calcified right thyroid lobe nodule, by report previously biopsied on 07/10/2020. Correlate with any available pathology. CT head impression #2 called by telephone at the time of interpretation on 08/25/2020 at 11:19 am to provider MARGAUX VENTER , who verbally acknowledged these results. IMPRESSION: CT head: 1. Mildly motion degraded exam. 2. Small-volume acute hemorrhage within the atrium of the left lateral ventricle. 3. Moderate/severe cerebral white matter chronic small vessel ischemic disease. 4. Redemonstrated small chronic lacunar infarct within the right cerebellar hemisphere. 5. Moderate cerebral atrophy with comparatively mild cerebellar atrophy. 6. Mild paranasal sinus disease at the imaged levels. 7. Right mastoid effusion. 8. Unchanged 1 cm right parotid nodule suspicious for primary parotid neoplasm. CT cervical spine: 1. No evidence of acute fracture in cervical spine. 2. Cervical spondylosis, as described. Electronically Signed   By: Kellie Simmering DO   On: 08/25/2020 11:22   CT Cervical Spine Wo Contrast  Result Date: 08/25/2020 CLINICAL DATA:  Possible head trauma. Additional provided: Patient found down. Dizziness. On blood thinners. Rule out fracture. EXAM: CT HEAD WITHOUT CONTRAST CT CERVICAL SPINE WITHOUT CONTRAST TECHNIQUE: Multidetector CT imaging of the head and cervical spine was performed following the standard protocol without intravenous contrast. Multiplanar CT image reconstructions of the cervical spine were also generated. COMPARISON:  Prior head CT examinations 07/31/2020 and earlier. CT of the cervical spine 02/07/2020.  Thyroid ultrasound 07/10/2020. FINDINGS: CT HEAD FINDINGS Brain: Mildly motion degraded exam. Moderate generalized  cerebral atrophy. A small volume acute hemorrhage within the atrium of the left lateral ventricle (series 4, image 15). Patchy and confluent hypoattenuation within the cerebral white matter, nonspecific but compatible with chronic small vessel ischemic disease. Redemonstrated small chronic lacunar infarct within the right cerebellar hemisphere (series 4, image 8). No demarcated cortical infarct. No extra-axial fluid collection. No evidence of intracranial mass. No midline shift. Vascular: No hyperdense vessel.  Atherosclerotic calcifications. Skull: Normal. Negative for fracture or focal suspicious osseous lesion. Sinuses/Orbits: Visualized orbits show no acute finding. Mild bilateral ethmoid sinus mucosal thickening. Trace left sphenoid sinus mucosal thickening. Other: Right mastoid effusion. Unchanged 1 cm right parotid nodule, suspicious for primary parotid neoplasm. CT CERVICAL SPINE FINDINGS Alignment: No significant spondylolisthesis. Partially imaged thoracic dextrocurvature. Skull base and vertebrae: The basion-dental and atlanto-dental intervals are maintained.No evidence of acute fracture to the cervical spine. Soft tissues and spinal canal: No prevertebral fluid or swelling. No visible canal hematoma. Disc levels: Cervical spondylosis with multilevel disc space narrowing, disc bulges, central disc protrusions, uncovertebral hypertrophy and facet arthrosis. No appreciable high-grade spinal canal stenosis. Multilevel bony neural foraminal narrowing. Upper chest: No consolidation within the imaged lung apices. No visible pneumothorax. Other: Redemonstrated 2 cm calcified right thyroid lobe nodule, by report previously biopsied on 07/10/2020. Correlate with any available pathology. CT head impression #2 called by telephone at the time of interpretation on 08/25/2020 at 11:19 am to provider MARGAUX VENTER , who verbally acknowledged these results. IMPRESSION: CT head: 1. Mildly motion degraded exam. 2.  Small-volume acute hemorrhage within the atrium of the left lateral ventricle. 3. Moderate/severe cerebral white matter chronic small vessel ischemic disease. 4. Redemonstrated small chronic lacunar infarct within the right cerebellar hemisphere. 5. Moderate cerebral atrophy with comparatively mild cerebellar atrophy. 6. Mild paranasal sinus disease at the imaged levels. 7. Right mastoid effusion. 8. Unchanged 1 cm right parotid nodule suspicious for primary parotid neoplasm. CT cervical spine: 1. No evidence of acute fracture in cervical spine. 2. Cervical spondylosis, as described. Electronically Signed   By: Kellie Simmering DO   On: 08/25/2020 11:22     Medical Consultants:   None.   Subjective:    MICKIE BADDERS no complain  Objective:    Vitals:   08/25/20 2235 08/25/20 2314 08/25/20 2319 08/26/20 0407  BP: 114/68 (!) 92/55 (!) 92/55 (!) 123/92  Pulse: 83 81 81   Resp: 16 18 18  (!) 22  Temp:   97.6 F (36.4 C) 98.4 F (36.9 C)  TempSrc:   Oral Oral  SpO2: 100% 99% 99% 99%  Weight:      Height:       SpO2: 99 %   Intake/Output Summary (Last 24 hours) at 08/26/2020 0732 Last data filed at 08/26/2020 1324 Gross per 24 hour  Intake 180 ml  Output 550 ml  Net -370 ml   Filed Weights   08/25/20 1225  Weight: 86.1 kg    Exam: General exam: In no acute distress. Respiratory system: Good air movement and clear to auscultation. Cardiovascular system: S1 & S2 heard, RRR.  Gastrointestinal system: Abdomen is nondistended, soft and nontender.  Extremities: No pedal edema. Skin: No rashes, lesions or ulcers Psychiatry: Judgement and insight appear normal.    Data Reviewed:    Labs: Basic Metabolic Panel: Recent Labs  Lab 08/25/20 0922  NA 135  K 3.9  CL 100  CO2 26  GLUCOSE 222*  BUN 18  CREATININE 1.24*  CALCIUM 9.1   GFR Estimated Creatinine Clearance: 36.7 mL/min (A) (by C-G formula based on SCr of 1.24 mg/dL (H)). Liver Function Tests: Recent Labs   Lab 08/25/20 0922  AST 18  ALT 16  ALKPHOS 116  BILITOT 1.5*  PROT 7.0  ALBUMIN 3.2*   No results for input(s): LIPASE, AMYLASE in the last 168 hours. No results for input(s): AMMONIA in the last 168 hours. Coagulation profile No results for input(s): INR, PROTIME in the last 168 hours. COVID-19 Labs  No results for input(s): DDIMER, FERRITIN, LDH, CRP in the last 72 hours.  Lab Results  Component Value Date   SARSCOV2NAA NEGATIVE 08/25/2020   SARSCOV2NAA NEGATIVE 07/05/2020   [place] NEGATIVE 06/30/2020   [place] NEGATIVE 06/28/2020    CBC: Recent Labs  Lab 08/25/20 0922  WBC 10.5  NEUTROABS 8.8*  HGB 13.7  HCT 43.2  MCV 92.7  PLT 224   Cardiac Enzymes: Recent Labs  Lab 08/25/20 0922  CKTOTAL 30*   BNP (last 3 results) No results for input(s): PROBNP in the last 8760 hours. CBG: Recent Labs  Lab 08/25/20 1656 08/25/20 2120 08/25/20 2317  GLUCAP 210* 268* 339*   D-Dimer: No results for input(s): DDIMER in the last 72 hours. Hgb A1c: No results for input(s): HGBA1C in the last 72 hours. Lipid Profile: No results for input(s): CHOL, HDL, LDLCALC, TRIG, CHOLHDL, LDLDIRECT in the last 72 hours. Thyroid function studies: No results for input(s): TSH, T4TOTAL, T3FREE, THYROIDAB in the last 72 hours.  Invalid input(s): FREET3 Anemia work up: No results for input(s): VITAMINB12, FOLATE, FERRITIN, TIBC, IRON, RETICCTPCT in the last 72 hours. Sepsis Labs: Recent Labs  Lab 08/25/20 0922  WBC 10.5   Microbiology Recent Results (from the past 240 hour(s))  Resp Panel by RT-PCR (Flu A&B, Covid) Nasopharyngeal Swab     Status: None   Collection Time: 08/25/20 12:12 PM   Specimen: Nasopharyngeal Swab; Nasopharyngeal(NP) swabs in vial transport medium  Result Value Ref Range Status   SARS Coronavirus 2 by RT PCR NEGATIVE NEGATIVE Final    Comment: (NOTE) SARS-CoV-2 target nucleic acids are NOT DETECTED.  The SARS-CoV-2 RNA is generally  detectable in upper respiratory specimens during the acute phase of infection. The lowest concentration of SARS-CoV-2 viral copies this assay can detect is 138 copies/mL. A negative result does not preclude SARS-Cov-2 infection and should not be used as the sole basis for treatment or other patient management decisions. A negative result may occur with  improper specimen collection/handling, submission of specimen other than nasopharyngeal swab, presence of viral mutation(s) within the areas targeted by this assay, and inadequate number of viral copies(<138 copies/mL). A negative result must be combined with clinical observations, patient history, and epidemiological information. The expected result is Negative.  Fact Sheet for Patients:  EntrepreneurPulse.com.au  Fact Sheet for Healthcare Providers:  IncredibleEmployment.be  This test is no t yet approved or cleared by the [place] FDA and  has been authorized for detection and/or diagnosis of SARS-CoV-2 by FDA under an Emergency Use Authorization (EUA). This EUA will remain  in effect (meaning this test can be used) for the duration of the COVID-19 declaration under Section 564(b)(1) of the Act, 21 U.S.C.section 360bbb-3(b)(1), unless the authorization is terminated  or revoked sooner.       Influenza A by PCR NEGATIVE NEGATIVE Final   Influenza B by PCR NEGATIVE NEGATIVE Final    Comment: (NOTE) The Xpert  Xpress SARS-CoV-2/FLU/RSV plus assay is intended as an aid in the diagnosis of influenza from Nasopharyngeal swab specimens and should not be used as a sole basis for treatment. Nasal washings and aspirates are unacceptable for Xpert Xpress SARS-CoV-2/FLU/RSV testing.  Fact Sheet for Patients: EntrepreneurPulse.com.au  Fact Sheet for Healthcare Providers: IncredibleEmployment.be  This test is not yet approved or cleared by the [place] FDA  and has been authorized for detection and/or diagnosis of SARS-CoV-2 by FDA under an Emergency Use Authorization (EUA). This EUA will remain in effect (meaning this test can be used) for the duration of the COVID-19 declaration under Section 564(b)(1) of the Act, 21 U.S.C. section 360bbb-3(b)(1), unless the authorization is terminated or revoked.  Performed at [facility], [place] [address], [place], [place] [address]      Medications:    amLODipine  5 mg Oral BID   timolol  1 drop Both Eyes BID   And   brimonidine  1 drop Both Eyes BID   ferrous sulfate  325 mg Oral q morning   FLUoxetine  20 mg Oral Daily   insulin aspart  0-15 Units Subcutaneous TID WC   levothyroxine  50 mcg Oral Daily   losartan  50 mg Oral BID   metFORMIN  500 mg Oral Q breakfast   metoprolol tartrate  12.5 mg Oral BID   multivitamin  1 tablet Oral q morning   pantoprazole  40 mg Oral BID AC   rosuvastatin  10 mg Oral QHS   sodium chloride flush  3 mL Intravenous Q12H   Continuous Infusions:    LOS: 1 day   Charlynne Cousins  [facility]  08/26/2020, 7:32 AM

## 2020-08-27 LAB — HEMOGLOBIN A1C
Hgb A1c MFr Bld: 8.4 % — ABNORMAL HIGH (ref 4.8–5.6)
Mean Plasma Glucose: 194 mg/dL

## 2020-08-27 LAB — GLUCOSE, CAPILLARY
Glucose-Capillary: 175 mg/dL — ABNORMAL HIGH (ref 70–99)
Glucose-Capillary: 248 mg/dL — ABNORMAL HIGH (ref 70–99)
Glucose-Capillary: 170 mg/dL — ABNORMAL HIGH (ref 70–99)
Glucose-Capillary: 119 mg/dL — ABNORMAL HIGH (ref 70–99)

## 2020-08-27 NOTE — Progress Notes (Signed)
MD notified of BP low 81/43 (see vitals for other trends). After placing pt in trendelenburg position  BP cam eup to 91/53. Checked manually and was 98/62. Pt is asymptomatic. No new orders at this time. Continue to monitor.

## 2020-08-27 NOTE — Discharge Summary (Addendum)
Physician Discharge Summary  Diana [name] LNL:[id number] DOB: 17-Feb-1934 DOA: 08/25/2020  PCP: Christain Sacramento, MD  Admit date: 08/25/2020 Discharge date: 08/27/2020  Admitted From: Home Disposition:  SNF  Recommendations for Outpatient Follow-up:  Follow up with PCP in 1-2 weeks Please obtain BMP/CBC in one week   Home Health:Yes Equipment/Devices:no  Discharge Condition:stable CODE STATUS:Full Diet recommendation: Heart Healthy   Brief/Interim Summary: 87 y.o. female past medical history significant for ambulatory dysfunction with frequent falls, paroxysmal atrial fibrillation on Eliquis, mild dementia insulin-dependent diabetes mellitus, chronic kidney disease stage III presents with fall.  According to assisted living facility records the patient was found on the floor, according to his sister-in-law she has had several falls this year.     CT of the head shows small volume acute hemorrhage within the right atrium of the left ventricle, cerebral white matter chronic small vessel disease, small chronic lacunar infarct on the right.  Moderate cerebral atrophy.  And an unchanged 1 cm parotid nodule suspicious for primary parotid neoplasm     CT cervical C-spine: Showed no evidence of acute fracture.  Discharge Diagnoses:  Active Problems:   Intracranial hemorrhage [facility]) Neurosurgery was consulted recommended conservative management. Eliquis was discontinued. Her antihypertensive medications were resumed her goal blood pressures less than 160/100. She has not candidate in the future for Eliquis.  Essential hypertension: Medications were held on admission they will be resumed as an outpatient no changes made to her medication.  Chronic atrial fibrillation: Rate controlled not a candidate for anticoagulation due to frequent fall.  Insulin-dependent diabetes mellitus type 2: No change made to her regimen.  Right distal tibia fracture: X-ray of her ankle show probable  nondisplaced fracture involving the distal fibula, with extensive soft tissue swelling orthopedic doctor reviewed imaging with orthopedics and recommended a cam boot weightbearing as tolerated.  Chronic kidney disease stage III: Creatinine remained at baseline.  Heart murmur: 2D echo was unremarkable. Discharge Instructions  Discharge Instructions     Diet - low sodium heart healthy   Complete by: As directed    Increase activity slowly   Complete by: As directed       Allergies as of 08/27/2020       Reactions   Codeine Sulfate Nausea Only   Sps [sodium Polystyrene Sulfonate] Other (See Comments)   Reaction not cited   Sulfa Antibiotics Other (See Comments)   Reaction not cited        Medication List     STOP taking these medications    apixaban 2.5 MG Tabs tablet Commonly known as: ELIQUIS   ferrous sulfate 325 (65 FE) MG tablet       TAKE these medications    brimonidine-timolol 0.2-0.5 % ophthalmic solution Commonly known as: Combigan Place 1 drop into both eyes every 12 (twelve) hours.   FLUoxetine 20 MG capsule Commonly known as: PROZAC Take 1 capsule (20 mg total) by mouth daily.   insulin glargine 100 UNIT/ML Solostar Pen Commonly known as: LANTUS Inject 7 Units into the skin at bedtime.   levothyroxine 50 MCG tablet Commonly known as: SYNTHROID Take 1 tablet (50 mcg total) by mouth daily.   losartan 50 MG tablet Commonly known as: COZAAR Take 1 tablet (50 mg total) by mouth 2 (two) times daily.   metFORMIN 500 MG tablet Commonly known as: GLUCOPHAGE Take 500 mg by mouth daily with breakfast.   metoprolol tartrate 25 MG tablet Commonly known as: LOPRESSOR Take 0.5 tablets (12.5 mg total) by  mouth 2 (two) times daily.   pantoprazole 40 MG tablet Commonly known as: PROTONIX Take 1 tablet (40 mg total) by mouth 2 (two) times daily before a meal.   PRESERVISION AREDS 2 PO Take 1 capsule by mouth every morning.   rosuvastatin 10 MG  tablet Commonly known as: CRESTOR Take 10 mg by mouth at bedtime.        Allergies  Allergen Reactions   Codeine Sulfate Nausea Only   Sps [Sodium Polystyrene Sulfonate] Other (See Comments)    Reaction not cited   Sulfa Antibiotics Other (See Comments)    Reaction not cited    Consultations: Neurosurgery   Procedures/Studies: DG Ankle Complete Right  Result Date: 08/25/2020 CLINICAL DATA:  Fall with ankle swelling. EXAM: RIGHT ANKLE - COMPLETE 3+ VIEW COMPARISON:  None. FINDINGS: Soft tissue swelling in the right ankle, particularly along the lateral aspect. The ankle is located but there is widening along the lateral ankle mortise with some tilting of the talus. Oblique image raises concern for a nondisplaced fracture involving the fibula at the level of the ankle joint. Prominent plantar calcaneal spur. IMPRESSION: Probable nondisplaced fracture involving the distal fibula only seen on the oblique image. There is extensive soft tissue swelling along the lateral aspect of the ankle. Widening along the lateral aspect of the ankle mortise as described. Electronically Signed   By: Markus Daft M.D.   On: 08/25/2020 12:48   CT HEAD WO CONTRAST  Result Date: 08/26/2020 CLINICAL DATA:  87 year old female found down on blood thinners with trace intraventricular hemorrhage. EXAM: CT HEAD WITHOUT CONTRAST TECHNIQUE: Contiguous axial images were obtained from the base of the skull through the vertex without intravenous contrast. COMPARISON:  Head CT 08/25/2020. FINDINGS: Brain: Subtle trace IVH now layering in the left occipital horn on series 4, image 16. No other intracranial hemorrhage identified. No ventriculomegaly. No midline shift, mass effect, or evidence of intracranial mass lesion. Stable gray-white matter differentiation throughout the brain. Dystrophic basal ganglia calcifications. Patchy and confluent bilateral white matter hypodensity. No cortically based acute infarct identified.  Vascular: Calcified atherosclerosis at the skull base. No suspicious intracranial vascular hyperdensity. Skull: Stable.  No fracture identified. Sinuses/Orbits: Stable opacified right mastoid air cells. Right tympanic cavity remains clear. Other Visualized paranasal sinuses and mastoids are stable and well aerated. Other: No orbit or scalp soft tissue injury identified. IMPRESSION: 1. Subtle, trace residual intraventricular hemorrhage now layering in the left occipital horn. Most likely this is posttraumatic. 2. No other acute intracranial abnormality. Chronic small vessel disease. 3. Stable right mastoid effusion. Electronically Signed   By: Genevie Ann M.D.   On: 08/26/2020 11:00   CT Head Wo Contrast  Result Date: 08/25/2020 CLINICAL DATA:  Possible head trauma. Additional provided: Patient found down. Dizziness. On blood thinners. Rule out fracture. EXAM: CT HEAD WITHOUT CONTRAST CT CERVICAL SPINE WITHOUT CONTRAST TECHNIQUE: Multidetector CT imaging of the head and cervical spine was performed following the standard protocol without intravenous contrast. Multiplanar CT image reconstructions of the cervical spine were also generated. COMPARISON:  Prior head CT examinations 07/31/2020 and earlier. CT of the cervical spine 02/07/2020. Thyroid ultrasound 07/10/2020. FINDINGS: CT HEAD FINDINGS Brain: Mildly motion degraded exam. Moderate generalized cerebral atrophy. A small volume acute hemorrhage within the atrium of the left lateral ventricle (series 4, image 15). Patchy and confluent hypoattenuation within the cerebral white matter, nonspecific but compatible with chronic small vessel ischemic disease. Redemonstrated small chronic lacunar infarct within the right cerebellar hemisphere (series  4, image 8). No demarcated cortical infarct. No extra-axial fluid collection. No evidence of intracranial mass. No midline shift. Vascular: No hyperdense vessel.  Atherosclerotic calcifications. Skull: Normal. Negative for  fracture or focal suspicious osseous lesion. Sinuses/Orbits: Visualized orbits show no acute finding. Mild bilateral ethmoid sinus mucosal thickening. Trace left sphenoid sinus mucosal thickening. Other: Right mastoid effusion. Unchanged 1 cm right parotid nodule, suspicious for primary parotid neoplasm. CT CERVICAL SPINE FINDINGS Alignment: No significant spondylolisthesis. Partially imaged thoracic dextrocurvature. Skull base and vertebrae: The basion-dental and atlanto-dental intervals are maintained.No evidence of acute fracture to the cervical spine. Soft tissues and spinal canal: No prevertebral fluid or swelling. No visible canal hematoma. Disc levels: Cervical spondylosis with multilevel disc space narrowing, disc bulges, central disc protrusions, uncovertebral hypertrophy and facet arthrosis. No appreciable high-grade spinal canal stenosis. Multilevel bony neural foraminal narrowing. Upper chest: No consolidation within the imaged lung apices. No visible pneumothorax. Other: Redemonstrated 2 cm calcified right thyroid lobe nodule, by report previously biopsied on 07/10/2020. Correlate with any available pathology. CT head impression #2 called by telephone at the time of interpretation on 08/25/2020 at 11:19 am to provider MARGAUX VENTER , who verbally acknowledged these results. IMPRESSION: CT head: 1. Mildly motion degraded exam. 2. Small-volume acute hemorrhage within the atrium of the left lateral ventricle. 3. Moderate/severe cerebral white matter chronic small vessel ischemic disease. 4. Redemonstrated small chronic lacunar infarct within the right cerebellar hemisphere. 5. Moderate cerebral atrophy with comparatively mild cerebellar atrophy. 6. Mild paranasal sinus disease at the imaged levels. 7. Right mastoid effusion. 8. Unchanged 1 cm right parotid nodule suspicious for primary parotid neoplasm. CT cervical spine: 1. No evidence of acute fracture in cervical spine. 2. Cervical spondylosis, as  described. Electronically Signed   By: Kellie Simmering DO   On: 08/25/2020 11:22   CT HEAD WO CONTRAST  Result Date: 07/31/2020 CLINICAL DATA:  Status post fall. The patient is on anticoagulation therapy. EXAM: CT HEAD WITHOUT CONTRAST TECHNIQUE: Contiguous axial images were obtained from the base of the skull through the vertex without intravenous contrast. COMPARISON:  Head CT 06/28/2020. FINDINGS: Brain: No evidence of acute infarction, hemorrhage, hydrocephalus, extra-axial collection or mass lesion/mass effect. Atrophy and chronic microvascular ischemic change again seen. Vascular: No hyperdense vessel or unexpected calcification. Skull: No fracture or focal lesion. Sinuses/Orbits: Negative. Other: None. IMPRESSION: No acute abnormality. Atrophy and chronic microvascular ischemic change. Electronically Signed   By: Inge Rise M.D.   On: 07/31/2020 16:38   CT Cervical Spine Wo Contrast  Result Date: 08/25/2020 CLINICAL DATA:  Possible head trauma. Additional provided: Patient found down. Dizziness. On blood thinners. Rule out fracture. EXAM: CT HEAD WITHOUT CONTRAST CT CERVICAL SPINE WITHOUT CONTRAST TECHNIQUE: Multidetector CT imaging of the head and cervical spine was performed following the standard protocol without intravenous contrast. Multiplanar CT image reconstructions of the cervical spine were also generated. COMPARISON:  Prior head CT examinations 07/31/2020 and earlier. CT of the cervical spine 02/07/2020. Thyroid ultrasound 07/10/2020. FINDINGS: CT HEAD FINDINGS Brain: Mildly motion degraded exam. Moderate generalized cerebral atrophy. A small volume acute hemorrhage within the atrium of the left lateral ventricle (series 4, image 15). Patchy and confluent hypoattenuation within the cerebral white matter, nonspecific but compatible with chronic small vessel ischemic disease. Redemonstrated small chronic lacunar infarct within the right cerebellar hemisphere (series 4, image 8). No  demarcated cortical infarct. No extra-axial fluid collection. No evidence of intracranial mass. No midline shift. Vascular: No hyperdense vessel.  Atherosclerotic calcifications. Skull:  Normal. Negative for fracture or focal suspicious osseous lesion. Sinuses/Orbits: Visualized orbits show no acute finding. Mild bilateral ethmoid sinus mucosal thickening. Trace left sphenoid sinus mucosal thickening. Other: Right mastoid effusion. Unchanged 1 cm right parotid nodule, suspicious for primary parotid neoplasm. CT CERVICAL SPINE FINDINGS Alignment: No significant spondylolisthesis. Partially imaged thoracic dextrocurvature. Skull base and vertebrae: The basion-dental and atlanto-dental intervals are maintained.No evidence of acute fracture to the cervical spine. Soft tissues and spinal canal: No prevertebral fluid or swelling. No visible canal hematoma. Disc levels: Cervical spondylosis with multilevel disc space narrowing, disc bulges, central disc protrusions, uncovertebral hypertrophy and facet arthrosis. No appreciable high-grade spinal canal stenosis. Multilevel bony neural foraminal narrowing. Upper chest: No consolidation within the imaged lung apices. No visible pneumothorax. Other: Redemonstrated 2 cm calcified right thyroid lobe nodule, by report previously biopsied on 07/10/2020. Correlate with any available pathology. CT head impression #2 called by telephone at the time of interpretation on 08/25/2020 at 11:19 am to provider MARGAUX VENTER , who verbally acknowledged these results. IMPRESSION: CT head: 1. Mildly motion degraded exam. 2. Small-volume acute hemorrhage within the atrium of the left lateral ventricle. 3. Moderate/severe cerebral white matter chronic small vessel ischemic disease. 4. Redemonstrated small chronic lacunar infarct within the right cerebellar hemisphere. 5. Moderate cerebral atrophy with comparatively mild cerebellar atrophy. 6. Mild paranasal sinus disease at the imaged levels. 7.  Right mastoid effusion. 8. Unchanged 1 cm right parotid nodule suspicious for primary parotid neoplasm. CT cervical spine: 1. No evidence of acute fracture in cervical spine. 2. Cervical spondylosis, as described. Electronically Signed   By: Kellie Simmering DO   On: 08/25/2020 11:22   ECHOCARDIOGRAM COMPLETE  Result Date: 08/26/2020    ECHOCARDIOGRAM REPORT   Patient Name:   Diana [name] Date of Exam: 08/26/2020 Medical Rec #:  [id number]            Height:       66.0 in Accession #:    [id number]           Weight:       189.8 lb Date of Birth:  1933-05-29            BSA:          1.956 m Patient Age:    35 years             BP:           123/92 mmHg Patient Gender: F                    HR:           81 bpm. Exam Location:  Inpatient Procedure: 2D Echo, Cardiac Doppler and Color Doppler Indications:    Murmur  History:        Patient has prior history of Echocardiogram examinations, most                 recent 12/28/2018. Arrythmias:Atrial Fibrillation; Risk                 Factors:Hypertension, Diabetes and Former Smoker.  Sonographer:    Cammy Brochure Referring Phys: [id number] [place]  1. Left ventricular ejection fraction, by estimation, is 70 to 75%. The left ventricle has hyperdynamic function. The left ventricle has no regional wall motion abnormalities. There is mild left ventricular hypertrophy. Left ventricular diastolic function could not be evaluated.  2. Right ventricular systolic function is mildly reduced. The right ventricular size  is normal. There is mildly elevated pulmonary artery systolic pressure.  3. Left atrial size was severely dilated.  4. Right atrial size was mildly dilated.  5. The mitral valve is abnormal. Trivial mitral valve regurgitation.  6. The aortic valve is tricuspid. Mild sclerosis. Aortic valve regurgitation is not visualized. Aortic valve mean gradient measures 8.0 mmHg, DI is 0.50  7. The inferior vena cava is normal in size with greater than 50%  respiratory variability, suggesting right atrial pressure of 3 mmHg. Comparison(s): Changes from prior study are noted. 12/28/2018: LVEF 60-65%, no aortic stenosis. FINDINGS  Left Ventricle: Left ventricular ejection fraction, by estimation, is 70 to 75%. The left ventricle has hyperdynamic function. The left ventricle has no regional wall motion abnormalities. The left ventricular internal cavity size was normal in size. There is mild left ventricular hypertrophy. Abnormal (paradoxical) septal motion, consistent with left bundle branch block. Left ventricular diastolic function could not be evaluated due to atrial fibrillation. Left ventricular diastolic function could not be evaluated. Right Ventricle: The right ventricular size is normal. No increase in right ventricular wall thickness. Right ventricular systolic function is mildly reduced. There is mildly elevated pulmonary artery systolic pressure. The tricuspid regurgitant velocity  is 3.03 m/s, and with an assumed right atrial pressure of 3 mmHg, the estimated right ventricular systolic pressure is 07.3 mmHg. Left Atrium: Left atrial size was severely dilated. Right Atrium: Right atrial size was mildly dilated. Pericardium: There is no evidence of pericardial effusion. Mitral Valve: The mitral valve is abnormal. There is moderate calcification of the anterior and posterior mitral valve leaflet(s). Mild to moderate mitral annular calcification. Trivial mitral valve regurgitation. Tricuspid Valve: The tricuspid valve is grossly normal. Tricuspid valve regurgitation is trivial. Aortic Valve: The aortic valve is tricuspid. Aortic valve regurgitation is not visualized. Mild aortic valve sclerosis is present, with no evidence of aortic valve stenosis. Aortic valve mean gradient measures 8.0 mmHg. Pulmonic Valve: The pulmonic valve was normal in structure. Pulmonic valve regurgitation is not visualized. Aorta: The aortic root and ascending aorta are structurally  normal, with no evidence of dilitation. Venous: The inferior vena cava is normal in size with greater than 50% respiratory variability, suggesting right atrial pressure of 3 mmHg. IAS/Shunts: No atrial level shunt detected by color flow Doppler.  LEFT VENTRICLE PLAX 2D LVIDd:         4.20 cm  Diastology LVIDs:         2.80 cm  LV e' medial:    4.68 cm/s LV PW:         1.30 cm  LV E/e' medial:  28.8 LV IVS:        1.10 cm  LV e' lateral:   5.77 cm/s LVOT diam:     2.00 cm  LV E/e' lateral: 23.4 LV SV:         55 LV SV Index:   28 LVOT Area:     3.14 cm  RIGHT VENTRICLE            IVC RV S prime:     8.38 cm/s  IVC diam: 1.10 cm LEFT ATRIUM              Index       RIGHT ATRIUM           Index LA diam:        5.00 cm  2.56 cm/m  RA Area:     20.30 cm LA Vol (A2C):   88.2 ml  45.09 ml/m RA Volume:   55.80 ml  28.53 ml/m LA Vol (A4C):   100.0 ml 51.13 ml/m LA Biplane Vol: 95.5 ml  48.83 ml/m  AORTIC VALVE AV Area (Vmax):    1.32 cm AV Area (Vmean):   1.45 cm AV Area (VTI):     1.56 cm AV Vmax:           194.00 cm/s AV Vmean:          132.000 cm/s AV VTI:            0.350 m AV Peak Grad:      15.1 mmHg AV Mean Grad:      8.0 mmHg LVOT Vmax:         81.70 cm/s LVOT Vmean:        61.000 cm/s LVOT VTI:          0.174 m LVOT/AV VTI ratio: 0.50  AORTA Ao Root diam: 2.40 cm MITRAL VALVE                TRICUSPID VALVE MV Area (PHT): 3.54 cm     TR Peak grad:   36.7 mmHg MV Decel Time: 214 msec     TR Vmax:        303.00 cm/s MV E velocity: 135.00 cm/s                             SHUNTS                             Systemic VTI:  0.17 m                             Systemic Diam: 2.00 cm Lyman Bishop MD Electronically signed by Lyman Bishop MD Signature Date/Time: 08/26/2020/1:09:56 PM    Final    (Echo, Carotid, EGD, Colonoscopy, ERCP)    Subjective: No complaints  Discharge Exam: Vitals:   08/27/20 1500 08/27/20 1544  BP: (!) 103/56 121/64  Pulse:  (!) 56  Resp:  20  Temp:  98.9 F (37.2 C)  SpO2:  98%    Vitals:   08/27/20 1200 08/27/20 1400 08/27/20 1500 08/27/20 1544  BP: (!) 92/59 (!) 102/45 (!) 103/56 121/64  Pulse: 61   (!) 56  Resp: 20   20  Temp:    98.9 F (37.2 C)  TempSrc:    Oral  SpO2:    98%  Weight:      Height:        General: Pt is alert, awake, not in acute distress Cardiovascular: RRR, S1/S2 +, no rubs, no gallops Respiratory: CTA bilaterally, no wheezing, no rhonchi Abdominal: Soft, NT, ND, bowel sounds + Extremities: no edema, no cyanosis    The results of significant diagnostics from this hospitalization (including imaging, microbiology, ancillary and laboratory) are listed below for reference.     Microbiology: Recent Results (from the past 240 hour(s))  Resp Panel by RT-PCR (Flu A&B, Covid) Nasopharyngeal Swab     Status: None   Collection Time: 08/25/20 12:12 PM   Specimen: Nasopharyngeal Swab; Nasopharyngeal(NP) swabs in vial transport medium  Result Value Ref Range Status   SARS Coronavirus 2 by RT PCR NEGATIVE NEGATIVE Final    Comment: (NOTE) SARS-CoV-2 target nucleic acids are NOT DETECTED.  The SARS-CoV-2 RNA is generally detectable in upper respiratory specimens  during the acute phase of infection. The lowest concentration of SARS-CoV-2 viral copies this assay can detect is 138 copies/mL. A negative result does not preclude SARS-Cov-2 infection and should not be used as the sole basis for treatment or other patient management decisions. A negative result may occur with  improper specimen collection/handling, submission of specimen other than nasopharyngeal swab, presence of viral mutation(s) within the areas targeted by this assay, and inadequate number of viral copies(<138 copies/mL). A negative result must be combined with clinical observations, patient history, and epidemiological information. The expected result is Negative.  Fact Sheet for Patients:  EntrepreneurPulse.com.au  Fact Sheet for Healthcare  Providers:  IncredibleEmployment.be  This test is no t yet approved or cleared by the [place] FDA and  has been authorized for detection and/or diagnosis of SARS-CoV-2 by FDA under an Emergency Use Authorization (EUA). This EUA will remain  in effect (meaning this test can be used) for the duration of the COVID-19 declaration under Section 564(b)(1) of the Act, 21 U.S.C.section 360bbb-3(b)(1), unless the authorization is terminated  or revoked sooner.       Influenza A by PCR NEGATIVE NEGATIVE Final   Influenza B by PCR NEGATIVE NEGATIVE Final    Comment: (NOTE) The Xpert Xpress SARS-CoV-2/FLU/RSV plus assay is intended as an aid in the diagnosis of influenza from Nasopharyngeal swab specimens and should not be used as a sole basis for treatment. Nasal washings and aspirates are unacceptable for Xpert Xpress SARS-CoV-2/FLU/RSV testing.  Fact Sheet for Patients: EntrepreneurPulse.com.au  Fact Sheet for Healthcare Providers: IncredibleEmployment.be  This test is not yet approved or cleared by the [place] FDA and has been authorized for detection and/or diagnosis of SARS-CoV-2 by FDA under an Emergency Use Authorization (EUA). This EUA will remain in effect (meaning this test can be used) for the duration of the COVID-19 declaration under Section 564(b)(1) of the Act, 21 U.S.C. section 360bbb-3(b)(1), unless the authorization is terminated or revoked.  Performed at [facility], [place] [address] [place], [place] [address]      Labs: BNP (last 3 results) No results for input(s): BNP in the last 8760 hours. Basic Metabolic Panel: Recent Labs  Lab 08/25/20 0922  NA 135  K 3.9  CL 100  CO2 26  GLUCOSE 222*  BUN 18  CREATININE 1.24*  CALCIUM 9.1   Liver Function Tests: Recent Labs  Lab 08/25/20 0922  AST 18  ALT 16  ALKPHOS 116  BILITOT 1.5*  PROT 7.0  ALBUMIN 3.2*   No results for  input(s): LIPASE, AMYLASE in the last 168 hours. No results for input(s): AMMONIA in the last 168 hours. CBC: Recent Labs  Lab 08/25/20 0922  WBC 10.5  NEUTROABS 8.8*  HGB 13.7  HCT 43.2  MCV 92.7  PLT 224   Cardiac Enzymes: Recent Labs  Lab 08/25/20 0922  CKTOTAL 30*   BNP: Invalid input(s): POCBNP CBG: Recent Labs  Lab 08/26/20 1813 08/26/20 2025 08/27/20 0632 08/27/20 1148 08/27/20 1608  GLUCAP 143* 131* 175* 248* 170*   D-Dimer No results for input(s): DDIMER in the last 72 hours. Hgb A1c No results for input(s): HGBA1C in the last 72 hours. Lipid Profile No results for input(s): CHOL, HDL, LDLCALC, TRIG, CHOLHDL, LDLDIRECT in the last 72 hours. Thyroid function studies No results for input(s): TSH, T4TOTAL, T3FREE, THYROIDAB in the last 72 hours.  Invalid input(s): FREET3 Anemia work up No results for input(s): VITAMINB12, FOLATE, FERRITIN, TIBC, IRON, RETICCTPCT in the last  72 hours. Urinalysis    Component Value Date/Time   COLORURINE YELLOW 08/25/2020 1604   APPEARANCEUR CLEAR 08/25/2020 1604   LABSPEC 1.011 08/25/2020 1604   PHURINE 7.0 08/25/2020 1604   GLUCOSEU 150 (A) 08/25/2020 1604   HGBUR NEGATIVE 08/25/2020 [place] 08/25/2020 [place] 08/25/2020 1604   PROTEINUR 100 (A) 08/25/2020 1604   UROBILINOGEN 0.2 07/05/2009 0331   NITRITE NEGATIVE 08/25/2020 1604   LEUKOCYTESUR TRACE (A) 08/25/2020 1604   Sepsis Labs Invalid input(s): PROCALCITONIN,  WBC,  LACTICIDVEN Microbiology Recent Results (from the past 240 hour(s))  Resp Panel by RT-PCR (Flu A&B, Covid) Nasopharyngeal Swab     Status: None   Collection Time: 08/25/20 12:12 PM   Specimen: Nasopharyngeal Swab; Nasopharyngeal(NP) swabs in vial transport medium  Result Value Ref Range Status   SARS Coronavirus 2 by RT PCR NEGATIVE NEGATIVE Final    Comment: (NOTE) SARS-CoV-2 target nucleic acids are NOT DETECTED.  The SARS-CoV-2 RNA is generally  detectable in upper respiratory specimens during the acute phase of infection. The lowest concentration of SARS-CoV-2 viral copies this assay can detect is 138 copies/mL. A negative result does not preclude SARS-Cov-2 infection and should not be used as the sole basis for treatment or other patient management decisions. A negative result may occur with  improper specimen collection/handling, submission of specimen other than nasopharyngeal swab, presence of viral mutation(s) within the areas targeted by this assay, and inadequate number of viral copies(<138 copies/mL). A negative result must be combined with clinical observations, patient history, and epidemiological information. The expected result is Negative.  Fact Sheet for Patients:  EntrepreneurPulse.com.au  Fact Sheet for Healthcare Providers:  IncredibleEmployment.be  This test is no t yet approved or cleared by the [place] FDA and  has been authorized for detection and/or diagnosis of SARS-CoV-2 by FDA under an Emergency Use Authorization (EUA). This EUA will remain  in effect (meaning this test can be used) for the duration of the COVID-19 declaration under Section 564(b)(1) of the Act, 21 U.S.C.section 360bbb-3(b)(1), unless the authorization is terminated  or revoked sooner.       Influenza A by PCR NEGATIVE NEGATIVE Final   Influenza B by PCR NEGATIVE NEGATIVE Final    Comment: (NOTE) The Xpert Xpress SARS-CoV-2/FLU/RSV plus assay is intended as an aid in the diagnosis of influenza from Nasopharyngeal swab specimens and should not be used as a sole basis for treatment. Nasal washings and aspirates are unacceptable for Xpert Xpress SARS-CoV-2/FLU/RSV testing.  Fact Sheet for Patients: EntrepreneurPulse.com.au  Fact Sheet for Healthcare Providers: IncredibleEmployment.be  This test is not yet approved or cleared by the [place] FDA  and has been authorized for detection and/or diagnosis of SARS-CoV-2 by FDA under an Emergency Use Authorization (EUA). This EUA will remain in effect (meaning this test can be used) for the duration of the COVID-19 declaration under Section 564(b)(1) of the Act, 21 U.S.C. section 360bbb-3(b)(1), unless the authorization is terminated or revoked.  Performed at [facility], [place] [address], [place], [place] [address]      Time coordinating discharge: Over 40 minutes  SIGNED:   Charlynne Cousins, MD  [facility] 08/27/2020, 4:33 PM Pager   If 7PM-7AM, please contact night-coverage [email or web address] Password [email or web address]

## 2020-08-27 NOTE — Progress Notes (Signed)
Physical Therapy Treatment Patient Details Name: Diana [name] MRN: [id number] DOB: 1933-06-11 Today's Date: 08/27/2020    History of Present Illness 87 y.o. female presents with fall.  Pt's records from ALF and family report mutliple falls.  CT of the head shows small volume acute hemorrhage within the right atrium of the left ventricle, small chronic lacunar infarct on the right.  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  Pneoplasm. CT cervical C-spine: negative. Pt with R dital fibular fx.  PMHx: ambulatory dysfunction with frequent falls, paroxysmal atrial fibrillation on Eliquis, mild dementia, IDDM, chronic kidney disease stage III    PT Comments     Pt pleasantly confused; agreeable to participate in therapy session. Requiring total assist to don right CAM boot at edge of bed. Ambulating x 50 feet with a walker and min guard assist. Presents as a high fall risk based on decreased gait speed and history of falls. Recommend SNF to address deficits and maximize functional mobility.   Follow Up Recommendations  Supervision/Assistance - 24 hour;SNF     Equipment Recommendations  None recommended by PT    Recommendations for Other Services       Precautions / Restrictions Precautions Precautions: Fall;Other (comment) Required Braces or Orthoses: Other Brace Other Brace: R CAM boot Restrictions Weight Bearing Restrictions: Yes RLE Weight Bearing: Weight bearing as tolerated Other Position/Activity Restrictions: WBAT with Cam walker boot.    Mobility  Bed Mobility Overal bed mobility: Needs Assistance Bed Mobility: Supine to Sit;Sit to Supine     Supine to sit: Modified independent (Device/Increase time) Sit to supine: Modified independent (Device/Increase time)        Transfers Overall transfer level: Needs assistance Equipment used: Rolling walker (2 wheeled) Transfers: Sit to/from Stand Sit to Stand: Min guard          General transfer comment: assist to rise and steady  Ambulation/Gait Ambulation/Gait assistance: Min guard Gait Distance (Feet): 50 Feet Assistive device: Rolling walker (2 wheeled) Gait Pattern/deviations: Step-through pattern;Trunk flexed Gait velocity: slower Gait velocity interpretation: <1.8 ft/sec, indicate of risk for recurrent falls General Gait Details: Decreased coordination with CAM boot donned, increased trunk flexion, slow pace. Min guard for Barrister's clerk    Modified Rankin (Stroke Patients Only)       Balance Overall balance assessment: Needs assistance Sitting-balance support: No upper extremity supported;Bilateral upper extremity supported;Feet supported Sitting balance-Leahy Scale: Fair     Standing balance support: Bilateral upper extremity supported Standing balance-Leahy Scale: Poor Standing balance comment: reliant on AD for safety as of this evaluation                            Cognition Arousal/Alertness: Awake/alert Behavior During Therapy: WFL for tasks assessed/performed Overall Cognitive Status: History of cognitive impairments - at baseline                                 General Comments: dementia as baseline. A lot of "I don't know" for answers.      Exercises      General Comments        Pertinent Vitals/Pain Pain Assessment: Faces Faces Pain Scale: No hurt    Home Living  Prior Function            PT Goals (current goals can now be found in the care plan section) Acute Rehab PT Goals Patient Stated Goal: I don't know PT Goal Formulation: Patient unable to participate in goal setting Time For Goal Achievement: 09/02/20 Potential to Achieve Goals: Good Progress towards PT goals: Progressing toward goals    Frequency    Min 2X/week      PT Plan Discharge plan needs to be updated    Co-evaluation               AM-PAC PT "6 Clicks" Mobility   Outcome Measure  Help needed turning from your back to your side while in a flat bed without using bedrails?: None Help needed moving from lying on your back to sitting on the side of a flat bed without using bedrails?: None Help needed moving to and from a bed to a chair (including a wheelchair)?: A Little Help needed standing up from a chair using your arms (e.g., wheelchair or bedside chair)?: A Little Help needed to walk in hospital room?: A Little Help needed climbing 3-5 steps with a railing? : A Little 6 Click Score: 20    End of Session Equipment Utilized During Treatment: Other (comment) (CAM boot) Activity Tolerance: Patient tolerated treatment well Patient left: in bed;with call bell/phone within reach;with bed alarm set Nurse Communication: Mobility status PT Visit Diagnosis: Other abnormalities of gait and mobility (R26.89);Difficulty in walking, not elsewhere classified (R26.2)     Time: 9242-6834 PT Time Calculation (min) (ACUTE ONLY): 22 min  Charges:  $Therapeutic Activity: 8-22 mins                     Wyona Almas, PT, DPT Acute Rehabilitation Services Pager [phone] Office [phone]    Deno Etienne 08/27/2020, 4:39 PM

## 2020-08-27 NOTE — TOC Initial Note (Signed)
Transition of Care [facility]) - Initial/Assessment Note    Patient Details  Name: Diana [name] MRN: [id number] Date of Birth: 1933-05-06  Transition of Care [facility]) CM/SW Contact:    Geralynn Ochs, LCSW Phone Number: 08/27/2020, 11:49 AM  Clinical Narrative:      CSW reached out to [facility] about patient returning or the possibility of transitioning to ALF, and there is a waitlist for ALF placement, so the patient is unable to transition at this time. Patient from independent living, and the staff check on her about every two hours but they are unable to provide 24/7 supervision or assist. Patient did not require that much assist prior to admission. CSW discussed with patient's sister in law, Lelon Frohlich, about concern of patient returning back to independent living and Lelon Frohlich is hopeful for SNF placement for patient. CSW updated MD, and MD has ordered PT to reevaluate the patient. CSW to fax out for SNF placement. Ann preference for [facility], as the patient has been there before.              Expected Discharge Plan: Skilled Nursing Facility Barriers to Discharge: Continued Medical Work up, Ship broker   Patient Goals and CMS Choice Patient states their goals for this hospitalization and ongoing recovery are:: patient unable to participate in goal setting, only oriented to self CMS Medicare.gov Compare Post Acute Care list provided to:: Patient Represenative (must comment) Choice offered to / list presented to : Sibling  Expected Discharge Plan and Services Expected Discharge Plan: [place] Choice: [place] Living arrangements for the past 2 months: [place] Expected Discharge Date: 08/27/20                                    Prior Living Arrangements/Services Living arrangements for the past 2 months: [place] Lives with:: Self Patient language and need for interpreter  reviewed:: No Do you feel safe going back to the place where you live?: Yes      Need for Family Participation in Patient Care: Yes (Comment) Care giver support system in place?: No (comment) Current home services: DME Criminal Activity/Legal Involvement Pertinent to Current Situation/Hospitalization: No - Comment as needed  Activities of Daily Living      Permission Sought/Granted Permission sought to share information with : Facility Sport and exercise psychologist, Family Supports Permission granted to share information with : Yes, Verbal Permission Granted  Share Information with NAME: Lelon Frohlich  Permission granted to share info w AGENCY: SNF  Permission granted to share info w Relationship: Sister in law     Emotional Assessment   Attitude/Demeanor/Rapport: Unable to Assess Affect (typically observed): Unable to Assess Orientation: : Oriented to Self Alcohol / Substance Use: Not Applicable Psych Involvement: No (comment)  Admission diagnosis:  Intracranial hemorrhage ([place]) [I62.9] Fall at nursing home, initial encounter [W19.Merril Abbe, V76.160] Other closed fracture of distal end of right fibula, initial encounter [S82.831A] Patient Active Problem List   Diagnosis Date Noted   Intracranial hemorrhage ([place]) 08/25/2020   Generalized weakness 06/30/2020   CKD (chronic kidney disease) stage 3, GFR 30-59 ml/min ([place]) 06/30/2020   Dyspnea 06/30/2020   Influenza A 06/30/2020   Closed dislocation of left elbow 03/19/2020   Depression, recurrent ([place]) 01/21/2019   Hyperlipidemia associated with type 2 diabetes mellitus ([place]) 73/71/0626   Acute metabolic encephalopathy 94/85/4627   Fall 01/11/2019  Klebsiella UTI (urinary tract infection) 01/11/2019   Acute urinary retention 01/11/2019   Left kidney lesion 01/11/2019   Ulcerative esophagitis 01/11/2019   Persistent atrial fibrillation ([place])    Anemia 01/01/2019   Hiatal hernia    Symptomatic anemia 12/31/2018   Hypoglycemia 12/31/2018    Ribs, multiple fractures 12/31/2018   HTN (hypertension) 05/15/2013   OSA on CPAP 05/15/2013   Hypothyroidism 05/15/2013   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 [place] [address] Phone: [phone] Fax: [phone]     Social Determinants of Health (SDOH) Interventions    Readmission Risk Interventions Readmission Risk Prevention Plan 07/06/2020  Transportation Screening Complete  PCP or Specialist Appt within 5-7 Days Complete  Home Care Screening Complete  Medication Review (RN CM) Complete  Some recent data might be hidden

## 2020-08-28 DIAGNOSIS — I629 Nontraumatic intracranial hemorrhage, unspecified: Secondary | ICD-10-CM

## 2020-08-28 LAB — GLUCOSE, CAPILLARY
Glucose-Capillary: 200 mg/dL — ABNORMAL HIGH (ref 70–99)
Glucose-Capillary: 265 mg/dL — ABNORMAL HIGH (ref 70–99)
Glucose-Capillary: 91 mg/dL (ref 70–99)
Glucose-Capillary: 180 mg/dL — ABNORMAL HIGH (ref 70–99)
Glucose-Capillary: 188 mg/dL — ABNORMAL HIGH (ref 70–99)

## 2020-08-28 MED ORDER — METFORMIN HCL 500 MG PO TABS
1000.0000 mg | ORAL_TABLET | Freq: Every day | ORAL | Status: DC
  Filled 2020-08-28 (×2): qty 2

## 2020-08-28 MED ORDER — INSULIN GLARGINE 100 UNIT/ML ~~LOC~~ SOLN
5.0000 [IU] | Freq: Two times a day (BID) | SUBCUTANEOUS | Status: DC
  Administered 2020-08-28 – 2020-08-29 (×3): 5 [IU] via SUBCUTANEOUS
  Filled 2020-08-28 (×4): qty 0.05

## 2020-08-28 NOTE — NC FL2 (Signed)
[place] LEVEL OF CARE SCREENING TOOL     IDENTIFICATION  Patient Name: Diana [name] Birthdate: [birth year] Sex: female Admission Date (Current Location): 08/25/2020  [facility] and [place] Number:  Herbalist and Address:  [facility], [place] [address], [place], [place] [address]      Provider Number: [id number]  Attending Physician Name and Address:  Charlynne Cousins, MD  Relative Name and Phone Number:       Current Level of Care: Hospital Recommended Level of Care: [place] Prior Approval Number:    Date Approved/Denied:   PASRR Number: [id number]  Discharge Plan: SNF    Current Diagnoses: Patient Active Problem List   Diagnosis Date Noted   Intracranial hemorrhage ([place]) 08/25/2020   Generalized weakness 06/30/2020   CKD (chronic kidney disease) stage 3, GFR 30-59 ml/min ([place]) 06/30/2020   Dyspnea 06/30/2020   Influenza A 06/30/2020   Closed dislocation of left elbow 03/19/2020   Depression, recurrent ([place]) 01/21/2019   Hyperlipidemia associated with type 2 diabetes mellitus ([place]) 09/03/9483   Acute metabolic encephalopathy 46/27/0350   Fall 01/11/2019   Klebsiella UTI (urinary tract infection) 01/11/2019   Acute urinary retention 01/11/2019   Left kidney lesion 01/11/2019   Ulcerative esophagitis 01/11/2019   Persistent atrial fibrillation ([place])    Anemia 01/01/2019   Hiatal hernia    Symptomatic anemia 12/31/2018   Hypoglycemia 12/31/2018   Ribs, multiple fractures 12/31/2018   HTN (hypertension) 05/15/2013   OSA on CPAP 05/15/2013   Hypothyroidism 05/15/2013   Secondary DM with CKD stage 3 and hypertension ([place]) 05/15/2013   Lower extremity edema 05/15/2013   Renal insufficiency 05/15/2013   Morbid obesity ([place]) 05/15/2013   Breast cancer ([place]) 03/18/2011    Orientation RESPIRATION BLADDER Height & Weight     Self  Normal Incontinent Weight: 198 lb 6.6 oz (90 kg) Height:   5\' 6"  (167.6 cm)  BEHAVIORAL SYMPTOMS/MOOD NEUROLOGICAL BOWEL NUTRITION STATUS      Continent Diet (see DC summary)  AMBULATORY STATUS COMMUNICATION OF NEEDS Skin   Limited Assist Verbally Normal                       Personal Care Assistance Level of Assistance  Bathing, Feeding, Dressing Bathing Assistance: Limited assistance Feeding assistance: Limited assistance Dressing Assistance: Limited assistance     Functional Limitations Info  Sight Sight Info: Impaired (blurred)        SPECIAL CARE FACTORS FREQUENCY  PT (By licensed PT), OT (By licensed OT)     PT Frequency: 5x/wk OT Frequency: 5x/wk            Contractures Contractures Info: Not present    Additional Factors Info  Code Status, Allergies, Psychotropic, Insulin Sliding Scale Code Status Info: Full Allergies Info: Codeine Sulfate, Sps (Sodium Polystyrene Sulfonate), Sulfa Antibiotics Psychotropic Info: Prozac 20mg  daily Insulin Sliding Scale Info: see DC summary       Current Medications (08/28/2020):  This is the current hospital active medication list Current Facility-Administered Medications  Medication Dose Route Frequency Provider Last Rate Last Admin   timolol (TIMOPTIC) 0.5 % ophthalmic solution 1 drop  1 drop Both Eyes BID Wynetta Fines T, MD   1 drop at 08/27/20 2228   And   brimonidine (ALPHAGAN) 0.2 % ophthalmic solution 1 drop  1 drop Both Eyes BID Wynetta Fines T, MD   1 drop at 08/27/20 2228   ferrous sulfate tablet  325 mg  325 mg Oral q morning Wynetta Fines T, MD   325 mg at 08/27/20 0837   FLUoxetine (PROZAC) capsule 20 mg  20 mg Oral Daily Wynetta Fines T, MD   20 mg at 08/27/20 1062   hydrALAZINE (APRESOLINE) tablet 25 mg  25 mg Oral Q6H PRN Wynetta Fines T, MD   25 mg at 08/25/20 1824   insulin aspart (novoLOG) injection 0-15 Units  0-15 Units Subcutaneous TID WC Charlynne Cousins, MD   3 Units at 08/28/20 6948   insulin aspart (novoLOG) injection 0-5 Units  0-5 Units Subcutaneous QHS  Charlynne Cousins, MD       insulin aspart (novoLOG) injection 3 Units  3 Units Subcutaneous TID WC Charlynne Cousins, MD   3 Units at 08/27/20 1623   insulin glargine (LANTUS) injection 5 Units  5 Units Subcutaneous BID Charlynne Cousins, MD       levothyroxine (SYNTHROID) tablet 50 mcg  50 mcg Oral Daily Wynetta Fines T, MD   50 mcg at 08/28/20 0651   losartan (COZAAR) tablet 50 mg  50 mg Oral BID Wynetta Fines T, MD   50 mg at 08/27/20 2227   [START ON 08/29/2020] metFORMIN (GLUCOPHAGE) tablet 1,000 mg  1,000 mg Oral Q breakfast Charlynne Cousins, MD       metoprolol tartrate (LOPRESSOR) tablet 12.5 mg  12.5 mg Oral BID Wynetta Fines T, MD   12.5 mg at 08/27/20 2227   multivitamin (PROSIGHT) tablet 1 tablet  1 tablet Oral q morning Wynetta Fines T, MD   1 tablet at 08/27/20 0837   pantoprazole (PROTONIX) EC tablet 40 mg  40 mg Oral BID AC Wynetta Fines T, MD   40 mg at 08/27/20 1624   rosuvastatin (CRESTOR) tablet 10 mg  10 mg Oral QHS Wynetta Fines T, MD   10 mg at 08/27/20 2227   sodium chloride flush (NS) 0.9 % injection 3 mL  3 mL Intravenous Q12H Wynetta Fines T, MD   3 mL at 08/27/20 2229     Discharge Medications: Please see discharge summary for a list of discharge medications.  Relevant Imaging Results:  Relevant Lab Results:   Additional Information SS#: [id number]  Geralynn Ochs, LCSW

## 2020-08-28 NOTE — Plan of Care (Signed)

## 2020-08-28 NOTE — Care Management Obs Status (Signed)
[place] NOTIFICATION   Patient Details  Name: EMANUELLE HAMMERSTROM MRN: [id number] Date of Birth: [birth year]   Medicare Observation Status Notification Given:  Yes    Geralynn Ochs, LCSW 08/28/2020, 4:11 PM

## 2020-08-28 NOTE — Plan of Care (Signed)
  Problem: Education: Goal: Knowledge of General Education information will improve Description: Including pain rating scale, medication(s)/side effects and non-pharmacologic comfort measures Outcome: Progressing   Problem: Health Behavior/Discharge Planning: Goal: Ability to manage health-related needs will improve Outcome: Progressing   Problem: Clinical Measurements: Goal: Ability to maintain clinical measurements within normal limits will improve Outcome: Progressing Goal: Cardiovascular complication will be avoided Outcome: Progressing   Problem: Activity: Goal: Risk for activity intolerance will decrease Outcome: Progressing   Problem: Coping: Goal: Level of anxiety will decrease Outcome: Progressing   Problem: Elimination: Goal: Will not experience complications related to bowel motility Outcome: Progressing Goal: Will not experience complications related to urinary retention Outcome: Progressing   Problem: Pain Managment: Goal: General experience of comfort will improve Outcome: Progressing   Problem: Safety: Goal: Ability to remain free from injury will improve Outcome: Progressing   Problem: Skin Integrity: Goal: Risk for impaired skin integrity will decrease Outcome: Progressing

## 2020-08-28 NOTE — Progress Notes (Signed)
Patient's caregiver Diana [name]  took home clothes and medical alert necklace.   Diana [name], Diana Ringer, RN

## 2020-08-28 NOTE — Care Management CC44 (Signed)
Condition Code 44 Documentation Completed  Patient Details  Name: DELESHA POHLMAN MRN: [id number] Date of Birth: 12/18/1933   Condition Code 44 given:  Yes Patient signature on Condition Code 44 notice:  Yes Documentation of 2 MD's agreement:  Yes Code 44 added to claim:  Yes    Geralynn Ochs, LCSW 08/28/2020, 4:12 PM

## 2020-08-28 NOTE — TOC Progression Note (Signed)
Transition of Care [facility]) - Progression Note    Patient Details  Name: EDMONIA GONSER MRN: [id number] Date of Birth: 22-Mar-1933  Transition of Care [facility]) CM/SW [place], [place] Phone Number: 08/28/2020, 4:12 PM  Clinical Narrative:   CSW spoke with patient's sister in law, Lelon Frohlich, about bed offers. [name] has not responded to referral, so Lelon Frohlich chose [facility] instead. [facility] able to offer a bed for patient. CSW asked for insurance authorization to be started on patient. CSW updated Lelon Frohlich that patient is pending insurance authorization, hopeful for discharge tomorrow. CSW to follow.    Expected Discharge Plan: Skilled Nursing Facility Barriers to Discharge: Continued Medical Work up, Ship broker  Expected Discharge Plan and Services Expected Discharge Plan: [place] Choice: [place] Living arrangements for the past 2 months: [place] Expected Discharge Date: 08/27/20                                     Social Determinants of Health (SDOH) Interventions    Readmission Risk Interventions Readmission Risk Prevention Plan 07/06/2020  Transportation Screening Complete  PCP or Specialist Appt within 5-7 Days Complete  Home Care Screening Complete  Medication Review (RN CM) Complete  Some recent data might be hidden

## 2020-08-28 NOTE — Care Management Important Message (Signed)
Important Message  Patient Details  Name: Diana [name] MRN: [id number] Date of Birth: 1934-03-02   Medicare Important Message Given:  Yes     Orbie Pyo 08/28/2020, 2:15 PM

## 2020-08-28 NOTE — Plan of Care (Signed)
  Problem: Education: Goal: Knowledge of General Education information will improve Description Including pain rating scale, medication(s)/side effects and non-pharmacologic comfort measures Outcome: Progressing   Problem: Health Behavior/Discharge Planning: Goal: Ability to manage health-related needs will improve Outcome: Progressing   

## 2020-08-28 NOTE — Progress Notes (Signed)
[facility] PROGRESS NOTE    Progress Note  Diana [name]  MWU:[id number] DOB: May 08, 1933 DOA: 08/25/2020 PCP: Christain Sacramento, MD     Brief Narrative:   Diana [name] is an 87 y.o. female past medical history significant for ambulatory dysfunction with frequent falls, paroxysmal atrial fibrillation on Eliquis, mild dementia insulin-dependent diabetes mellitus, chronic kidney disease stage III presents with fall.  According to assisted living facility records the patient was found on the floor, according to his sister-in-law she has had several falls this year.    CT of the head shows small volume acute hemorrhage within the right atrium of the left ventricle, cerebral white matter chronic small vessel disease, small chronic lacunar infarct on the right.  Moderate cerebral atrophy.  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 CT cervical C-spine: Showed no evidence of acute fracture.  Patient is a from an independent facility and needed to be placed at an ALF due to physical therapy needs, but there is a waiting list and a left so the patient is unable to transition at this time    Assessment/Plan:   Intracranial hemorrhage (HCC) CT of the head showed acute hemorrhage Eliquis was discontinued. Repeated CT scan showed trace residual intraventricular hemorrhage which is improved. Resume her antihypertensive medication.  Goal blood pressure less than 160/100. Not Candidate for Eliquis in the future. Neurologically she continues to improve significantly. She is will require assisted living facility placement with physical therapy, as she can now go back to independent living facility.  Essential hypertension: Continue Norvasc hydralazine and losartan blood pressure is relatively  controlled.  Chronic atrial fibrillation: Rate controlled, not a candidate for anticoagulation due to frequent falls.  Insulin-dependent diabetes mellitus  uncontrolled A1c is 8.4 Here she is on short acting insulin plus metformin, we will go ahead and increase her metformin we will have to go on low-dose Lantus.  Right distal tibia fracture: X-ray of the ankle showed probable nondisplaced fracture involving the distal fibula and only seen in oblique images.  Extensive soft tissue swelling. Orthopedic physician reviewed the imaging recommended cam boot and weightbearing as tolerated follow-up with them as an outpatient.  Chronic kidney disease stage III: Creatinine appears to be at baseline.  Heart murmur: Echo showed no acute findings.  DVT prophylaxis: scd Family Communication:none Status is: Inpatient  Remains inpatient appropriate because:Hemodynamically unstable  Dispo: The patient is from: Independent living facility              Anticipated d/c is to: ALF              Patient currently is not medically stable to d/c.   Difficult to place patient No      Code Status:     Code Status Orders  (From admission, onward)           Start     Ordered   08/25/20 1354  Full code  Continuous        08/25/20 1356           Code Status History     Date Active Date Inactive Code Status Order ID Comments User Context   07/01/2020 0314 07/06/2020 1917 Full Code [id number]  Chotiner, Yevonne Aline, MD ED   03/21/2020 1438 03/28/2020 2355 Full Code [id number]  Vivien Rota Inpatient   12/31/2018 0316 01/07/2019 2130 Full Code [id number]  [name], MD ED  IV Access:   Peripheral IV   Procedures and diagnostic studies:   CT HEAD WO CONTRAST  Result Date: 08/26/2020 CLINICAL DATA:  87 year old female found down on blood thinners with trace intraventricular hemorrhage. EXAM: CT HEAD WITHOUT CONTRAST TECHNIQUE: Contiguous axial images were obtained from the base of the skull through the vertex without intravenous contrast. COMPARISON:  Head CT 08/25/2020. FINDINGS: Brain: Subtle trace IVH now layering  in the left occipital horn on series 4, image 16. No other intracranial hemorrhage identified. No ventriculomegaly. No midline shift, mass effect, or evidence of intracranial mass lesion. Stable gray-white matter differentiation throughout the brain. Dystrophic basal ganglia calcifications. Patchy and confluent bilateral white matter hypodensity. No cortically based acute infarct identified. Vascular: Calcified atherosclerosis at the skull base. No suspicious intracranial vascular hyperdensity. Skull: Stable.  No fracture identified. Sinuses/Orbits: Stable opacified right mastoid air cells. Right tympanic cavity remains clear. Other Visualized paranasal sinuses and mastoids are stable and well aerated. Other: No orbit or scalp soft tissue injury identified. IMPRESSION: 1. Subtle, trace residual intraventricular hemorrhage now layering in the left occipital horn. Most likely this is posttraumatic. 2. No other acute intracranial abnormality. Chronic small vessel disease. 3. Stable right mastoid effusion. Electronically Signed   By: Genevie Ann M.D.   On: 08/26/2020 11:00   ECHOCARDIOGRAM COMPLETE  Result Date: 08/26/2020    ECHOCARDIOGRAM REPORT   Patient Name:   Diana [name] Date of Exam: 08/26/2020 Medical Rec #:  [id number]            Height:       66.0 in Accession #:    [id number]           Weight:       189.8 lb Date of Birth:  [birth year]            BSA:          1.956 m Patient Age:    36 years             BP:           123/92 mmHg Patient Gender: F                    HR:           81 bpm. Exam Location:  Inpatient Procedure: 2D Echo, Cardiac Doppler and Color Doppler Indications:    Murmur  History:        Patient has prior history of Echocardiogram examinations, most                 recent 12/28/2018. Arrythmias:Atrial Fibrillation; Risk                 Factors:Hypertension, Diabetes and Former Smoker.  Sonographer:    Cammy Brochure Referring Phys: [id number] [place]  1. Left  ventricular ejection fraction, by estimation, is 70 to 75%. The left ventricle has hyperdynamic function. The left ventricle has no regional wall motion abnormalities. There is mild left ventricular hypertrophy. Left ventricular diastolic function could not be evaluated.  2. Right ventricular systolic function is mildly reduced. The right ventricular size is normal. There is mildly elevated pulmonary artery systolic pressure.  3. Left atrial size was severely dilated.  4. Right atrial size was mildly dilated.  5. The mitral valve is abnormal. Trivial mitral valve regurgitation.  6. The aortic valve is tricuspid. Mild sclerosis. Aortic valve regurgitation is not visualized. Aortic valve mean gradient measures 8.0 mmHg, DI is  0.50  7. The inferior vena cava is normal in size with greater than 50% respiratory variability, suggesting right atrial pressure of 3 mmHg. Comparison(s): Changes from prior study are noted. 12/28/2018: LVEF 60-65%, no aortic stenosis. FINDINGS  Left Ventricle: Left ventricular ejection fraction, by estimation, is 70 to 75%. The left ventricle has hyperdynamic function. The left ventricle has no regional wall motion abnormalities. The left ventricular internal cavity size was normal in size. There is mild left ventricular hypertrophy. Abnormal (paradoxical) septal motion, consistent with left bundle branch block. Left ventricular diastolic function could not be evaluated due to atrial fibrillation. Left ventricular diastolic function could not be evaluated. Right Ventricle: The right ventricular size is normal. No increase in right ventricular wall thickness. Right ventricular systolic function is mildly reduced. There is mildly elevated pulmonary artery systolic pressure. The tricuspid regurgitant velocity  is 3.03 m/s, and with an assumed right atrial pressure of 3 mmHg, the estimated right ventricular systolic pressure is 41.2 mmHg. Left Atrium: Left atrial size was severely dilated. Right  Atrium: Right atrial size was mildly dilated. Pericardium: There is no evidence of pericardial effusion. Mitral Valve: The mitral valve is abnormal. There is moderate calcification of the anterior and posterior mitral valve leaflet(s). Mild to moderate mitral annular calcification. Trivial mitral valve regurgitation. Tricuspid Valve: The tricuspid valve is grossly normal. Tricuspid valve regurgitation is trivial. Aortic Valve: The aortic valve is tricuspid. Aortic valve regurgitation is not visualized. Mild aortic valve sclerosis is present, with no evidence of aortic valve stenosis. Aortic valve mean gradient measures 8.0 mmHg. Pulmonic Valve: The pulmonic valve was normal in structure. Pulmonic valve regurgitation is not visualized. Aorta: The aortic root and ascending aorta are structurally normal, with no evidence of dilitation. Venous: The inferior vena cava is normal in size with greater than 50% respiratory variability, suggesting right atrial pressure of 3 mmHg. IAS/Shunts: No atrial level shunt detected by color flow Doppler.  LEFT VENTRICLE PLAX 2D LVIDd:         4.20 cm  Diastology LVIDs:         2.80 cm  LV e' medial:    4.68 cm/s LV PW:         1.30 cm  LV E/e' medial:  28.8 LV IVS:        1.10 cm  LV e' lateral:   5.77 cm/s LVOT diam:     2.00 cm  LV E/e' lateral: 23.4 LV SV:         55 LV SV Index:   28 LVOT Area:     3.14 cm  RIGHT VENTRICLE            IVC RV S prime:     8.38 cm/s  IVC diam: 1.10 cm LEFT ATRIUM              Index       RIGHT ATRIUM           Index LA diam:        5.00 cm  2.56 cm/m  RA Area:     20.30 cm LA Vol (A2C):   88.2 ml  45.09 ml/m RA Volume:   55.80 ml  28.53 ml/m LA Vol (A4C):   100.0 ml 51.13 ml/m LA Biplane Vol: 95.5 ml  48.83 ml/m  AORTIC VALVE AV Area (Vmax):    1.32 cm AV Area (Vmean):   1.45 cm AV Area (VTI):     1.56 cm AV Vmax:  194.00 cm/s AV Vmean:          132.000 cm/s AV VTI:            0.350 m AV Peak Grad:      15.1 mmHg AV Mean Grad:       8.0 mmHg LVOT Vmax:         81.70 cm/s LVOT Vmean:        61.000 cm/s LVOT VTI:          0.174 m LVOT/AV VTI ratio: 0.50  AORTA Ao Root diam: 2.40 cm MITRAL VALVE                TRICUSPID VALVE MV Area (PHT): 3.54 cm     TR Peak grad:   36.7 mmHg MV Decel Time: 214 msec     TR Vmax:        303.00 cm/s MV E velocity: 135.00 cm/s                             SHUNTS                             Systemic VTI:  0.17 m                             Systemic Diam: 2.00 cm Lyman Bishop MD Electronically signed by Lyman Bishop MD Signature Date/Time: 08/26/2020/1:09:56 PM    Final      Medical Consultants:   None.   Subjective:    KYNZLIE HILLEARY no complaints  Objective:    Vitals:   08/28/20 0040 08/28/20 0441 08/28/20 0500 08/28/20 0758  BP: (!) 112/54 140/70  (!) 155/94  Pulse: 64 66  71  Resp: 20 18  20   Temp: 98.6 F (37 C) 98.3 F (36.8 C)  98.4 F (36.9 C)  TempSrc: Oral Oral  Oral  SpO2: 96% 95%  94%  Weight:   90 kg   Height:       SpO2: 94 %   Intake/Output Summary (Last 24 hours) at 08/28/2020 0843 Last data filed at 08/27/2020 2334 Gross per 24 hour  Intake 480 ml  Output 1500 ml  Net -1020 ml    Filed Weights   08/25/20 1225 08/27/20 0500 08/28/20 0500  Weight: 86.1 kg 88.6 kg 90 kg    Exam: General exam: In no acute distress. Respiratory system: Good air movement and clear to auscultation. Cardiovascular system: S1 & S2 heard, RRR. No JVD. Gastrointestinal system: Abdomen is nondistended, soft and nontender.  Extremities: No pedal edema. Skin: No rashes, lesions or ulcers  Data Reviewed:    Labs: Basic Metabolic Panel: Recent Labs  Lab 08/25/20 0922  NA 135  K 3.9  CL 100  CO2 26  GLUCOSE 222*  BUN 18  CREATININE 1.24*  CALCIUM 9.1    GFR Estimated Creatinine Clearance: 37.5 mL/min (A) (by C-G formula based on SCr of 1.24 mg/dL (H)). Liver Function Tests: Recent Labs  Lab 08/25/20 0922  AST 18  ALT 16  ALKPHOS 116  BILITOT 1.5*   PROT 7.0  ALBUMIN 3.2*    No results for input(s): LIPASE, AMYLASE in the last 168 hours. No results for input(s): AMMONIA in the last 168 hours. Coagulation profile No results for input(s): INR, PROTIME in the last 168 hours. COVID-19 Labs  No  results for input(s): DDIMER, FERRITIN, LDH, CRP in the last 72 hours.  Lab Results  Component Value Date   SARSCOV2NAA NEGATIVE 08/25/2020   SARSCOV2NAA NEGATIVE 07/05/2020   [place] NEGATIVE 06/30/2020   [place] NEGATIVE 06/28/2020    CBC: Recent Labs  Lab 08/25/20 0922  WBC 10.5  NEUTROABS 8.8*  HGB 13.7  HCT 43.2  MCV 92.7  PLT 224    Cardiac Enzymes: Recent Labs  Lab 08/25/20 0922  CKTOTAL 30*    BNP (last 3 results) No results for input(s): PROBNP in the last 8760 hours. CBG: Recent Labs  Lab 08/27/20 0632 08/27/20 1148 08/27/20 1608 08/27/20 2015 08/28/20 0618  GLUCAP 175* 248* 170* 119* 200*    D-Dimer: No results for input(s): DDIMER in the last 72 hours. Hgb A1c: Recent Labs    08/26/20 0800  HGBA1C 8.4*   Lipid Profile: No results for input(s): CHOL, HDL, LDLCALC, TRIG, CHOLHDL, LDLDIRECT in the last 72 hours. Thyroid function studies: No results for input(s): TSH, T4TOTAL, T3FREE, THYROIDAB in the last 72 hours.  Invalid input(s): FREET3 Anemia work up: No results for input(s): VITAMINB12, FOLATE, FERRITIN, TIBC, IRON, RETICCTPCT in the last 72 hours. Sepsis Labs: Recent Labs  Lab 08/25/20 0922  WBC 10.5    Microbiology Recent Results (from the past 240 hour(s))  Resp Panel by RT-PCR (Flu A&B, Covid) Nasopharyngeal Swab     Status: None   Collection Time: 08/25/20 12:12 PM   Specimen: Nasopharyngeal Swab; Nasopharyngeal(NP) swabs in vial transport medium  Result Value Ref Range Status   SARS Coronavirus 2 by RT PCR NEGATIVE NEGATIVE Final    Comment: (NOTE) SARS-CoV-2 target nucleic acids are NOT DETECTED.  The SARS-CoV-2 RNA is generally detectable in upper  respiratory specimens during the acute phase of infection. The lowest concentration of SARS-CoV-2 viral copies this assay can detect is 138 copies/mL. A negative result does not preclude SARS-Cov-2 infection and should not be used as the sole basis for treatment or other patient management decisions. A negative result may occur with  improper specimen collection/handling, submission of specimen other than nasopharyngeal swab, presence of viral mutation(s) within the areas targeted by this assay, and inadequate number of viral copies(<138 copies/mL). A negative result must be combined with clinical observations, patient history, and epidemiological information. The expected result is Negative.  Fact Sheet for Patients:  EntrepreneurPulse.com.au  Fact Sheet for Healthcare Providers:  IncredibleEmployment.be  This test is no t yet approved or cleared by the [place] FDA and  has been authorized for detection and/or diagnosis of SARS-CoV-2 by FDA under an Emergency Use Authorization (EUA). This EUA will remain  in effect (meaning this test can be used) for the duration of the COVID-19 declaration under Section 564(b)(1) of the Act, 21 U.S.C.section 360bbb-3(b)(1), unless the authorization is terminated  or revoked sooner.       Influenza A by PCR NEGATIVE NEGATIVE Final   Influenza B by PCR NEGATIVE NEGATIVE Final    Comment: (NOTE) The Xpert Xpress SARS-CoV-2/FLU/RSV plus assay is intended as an aid in the diagnosis of influenza from Nasopharyngeal swab specimens and should not be used as a sole basis for treatment. Nasal washings and aspirates are unacceptable for Xpert Xpress SARS-CoV-2/FLU/RSV testing.  Fact Sheet for Patients: EntrepreneurPulse.com.au  Fact Sheet for Healthcare Providers: IncredibleEmployment.be  This test is not yet approved or cleared by the [place] FDA and has been  authorized for detection and/or diagnosis of SARS-CoV-2 by FDA under an Emergency Use Authorization (EUA).  This EUA will remain in effect (meaning this test can be used) for the duration of the COVID-19 declaration under Section 564(b)(1) of the Act, 21 U.S.C. section 360bbb-3(b)(1), unless the authorization is terminated or revoked.  Performed at [facility], [place] [address], [place], [place] [address]      Medications:    timolol  1 drop Both Eyes BID   And   brimonidine  1 drop Both Eyes BID   ferrous sulfate  325 mg Oral q morning   FLUoxetine  20 mg Oral Daily   insulin aspart  0-15 Units Subcutaneous TID WC   insulin aspart  0-5 Units Subcutaneous QHS   insulin aspart  3 Units Subcutaneous TID WC   levothyroxine  50 mcg Oral Daily   losartan  50 mg Oral BID   metFORMIN  500 mg Oral Q breakfast   metoprolol tartrate  12.5 mg Oral BID   multivitamin  1 tablet Oral q morning   pantoprazole  40 mg Oral BID AC   rosuvastatin  10 mg Oral QHS   sodium chloride flush  3 mL Intravenous Q12H   Continuous Infusions:    LOS: 3 days   Charlynne Cousins  [facility]  08/28/2020, 8:43 AM

## 2020-08-29 DIAGNOSIS — Y92129 Unspecified place in nursing home as the place of occurrence of the external cause: Secondary | ICD-10-CM | POA: Diagnosis not present

## 2020-08-29 DIAGNOSIS — W19XXXA Unspecified fall, initial encounter: Secondary | ICD-10-CM | POA: Diagnosis not present

## 2020-08-29 DIAGNOSIS — I629 Nontraumatic intracranial hemorrhage, unspecified: Secondary | ICD-10-CM | POA: Diagnosis not present

## 2020-08-29 LAB — GLUCOSE, CAPILLARY: Glucose-Capillary: 174 mg/dL — ABNORMAL HIGH (ref 70–99)

## 2020-08-29 LAB — SARS CORONAVIRUS 2 (TAT 6-24 HRS): SARS Coronavirus 2: NEGATIVE

## 2020-08-29 MED ORDER — METFORMIN HCL 500 MG PO TABS: 1000.0000 mg | ORAL_TABLET | Freq: Every day | ORAL | Status: DC

## 2020-08-29 NOTE — Progress Notes (Signed)
Patient is ready for discharge. Report called to [facility] at [facility], discharge instructions placed in packet. [facility] called for transport.

## 2020-08-29 NOTE — Discharge Summary (Signed)
Physician Discharge Summary  Diana [name] GEZ:[id number] DOB: 02-13-1934 DOA: 08/25/2020  PCP: Christain [place], MD  Admit date: 08/25/2020 Discharge date: 08/29/2020  Admitted From: Home Disposition:  SNF  Recommendations for Outpatient Follow-up:  Follow up with PCP in 1-2 weeks Please obtain BMP/CBC in one week   Home Health:Yes Equipment/Devices:no  Discharge Condition:stable CODE STATUS:Full Diet recommendation: Heart Healthy   Brief/Interim Summary: 87 y.o. female past medical history significant for ambulatory dysfunction with frequent falls, paroxysmal atrial fibrillation on Eliquis, mild dementia insulin-dependent diabetes mellitus, chronic kidney disease stage III presents with fall.  According to assisted living facility records the patient was found on the floor, according to his sister-in-law she has had several falls this year.     CT of the head shows small volume acute hemorrhage within the right atrium of the left ventricle, cerebral white matter chronic small vessel disease, small chronic lacunar infarct on the right.  Moderate cerebral atrophy.  And an unchanged 1 cm parotid nodule suspicious for primary parotid neoplasm     CT cervical C-spine: Showed no evidence of acute fracture.  Discharge Diagnoses:  Active Problems:   Intracranial hemorrhage ([place])   Intracranial bleed [facility]) Neurosurgery was consulted recommended conservative management. Eliquis was discontinued. Her antihypertensive medications were resumed her goal blood pressures less than 160/100. She has not candidate in the future for Eliquis.  Essential hypertension: Medications were held on admission they will be resumed as an outpatient no changes made to her medication.  Chronic atrial fibrillation: Rate controlled not a candidate for anticoagulation due to frequent fall.  Insulin-dependent diabetes mellitus type 2: No change made to her regimen.  Right distal tibia fracture: X-ray  of her ankle show probable nondisplaced fracture involving the distal fibula, with extensive soft tissue swelling orthopedic doctor reviewed imaging with orthopedics and recommended a cam boot weightbearing as tolerated.  Chronic kidney disease stage III: Creatinine remained at baseline.  Heart murmur: 2D echo was unremarkable. Discharge Instructions  Discharge Instructions     Diet - low sodium heart healthy   Complete by: As directed    Diet - low sodium heart healthy   Complete by: As directed    Increase activity slowly   Complete by: As directed    Increase activity slowly   Complete by: As directed       Allergies as of 08/29/2020       Reactions   Codeine Sulfate Nausea Only   Sps [sodium Polystyrene Sulfonate] Other (See Comments)   Reaction not cited   Sulfa Antibiotics Other (See Comments)   Reaction not cited        Medication List     STOP taking these medications    apixaban 2.5 MG Tabs tablet Commonly known as: ELIQUIS   ferrous sulfate 325 (65 FE) MG tablet       TAKE these medications    brimonidine-timolol 0.2-0.5 % ophthalmic solution Commonly known as: Combigan Place 1 drop into both eyes every 12 (twelve) hours.   FLUoxetine 20 MG capsule Commonly known as: PROZAC Take 1 capsule (20 mg total) by mouth daily.   insulin glargine 100 UNIT/ML Solostar Pen Commonly known as: LANTUS Inject 7 Units into the skin at bedtime.   levothyroxine 50 MCG tablet Commonly known as: SYNTHROID Take 1 tablet (50 mcg total) by mouth daily.   losartan 50 MG tablet Commonly known as: COZAAR Take 1 tablet (50 mg total) by mouth 2 (two) times daily.   metFORMIN 500  MG tablet Commonly known as: GLUCOPHAGE Take 500 mg by mouth daily with breakfast. What changed: Another medication with the same name was added. Make sure you understand how and when to take each.   metFORMIN 500 MG tablet Commonly known as: GLUCOPHAGE Take 2 tablets (1,000 mg total)  by mouth daily with breakfast. Start taking on: August 30, 2020 What changed: You were already taking a medication with the same name, and this prescription was added. Make sure you understand how and when to take each.   metoprolol tartrate 25 MG tablet Commonly known as: LOPRESSOR Take 0.5 tablets (12.5 mg total) by mouth 2 (two) times daily.   pantoprazole 40 MG tablet Commonly known as: PROTONIX Take 1 tablet (40 mg total) by mouth 2 (two) times daily before a meal.   PRESERVISION AREDS 2 PO Take 1 capsule by mouth every morning.   rosuvastatin 10 MG tablet Commonly known as: CRESTOR Take 10 mg by mouth at bedtime.        Allergies  Allergen Reactions   Codeine Sulfate Nausea Only   Sps [Sodium Polystyrene Sulfonate] Other (See Comments)    Reaction not cited   Sulfa Antibiotics Other (See Comments)    Reaction not cited    Consultations: Neurosurgery   Procedures/Studies: DG Ankle Complete Right  Result Date: 08/25/2020 CLINICAL DATA:  Fall with ankle swelling. EXAM: RIGHT ANKLE - COMPLETE 3+ VIEW COMPARISON:  None. FINDINGS: Soft tissue swelling in the right ankle, particularly along the lateral aspect. The ankle is located but there is widening along the lateral ankle mortise with some tilting of the talus. Oblique image raises concern for a nondisplaced fracture involving the fibula at the level of the ankle joint. Prominent plantar calcaneal spur. IMPRESSION: Probable nondisplaced fracture involving the distal fibula only seen on the oblique image. There is extensive soft tissue swelling along the lateral aspect of the ankle. Widening along the lateral aspect of the ankle mortise as described. Electronically Signed   By: Markus Daft M.D.   On: 08/25/2020 12:48   CT HEAD WO CONTRAST  Result Date: 08/26/2020 CLINICAL DATA:  87 year old female found down on blood thinners with trace intraventricular hemorrhage. EXAM: CT HEAD WITHOUT CONTRAST TECHNIQUE: Contiguous axial  images were obtained from the base of the skull through the vertex without intravenous contrast. COMPARISON:  Head CT 08/25/2020. FINDINGS: Brain: Subtle trace IVH now layering in the left occipital horn on series 4, image 16. No other intracranial hemorrhage identified. No ventriculomegaly. No midline shift, mass effect, or evidence of intracranial mass lesion. Stable gray-white matter differentiation throughout the brain. Dystrophic basal ganglia calcifications. Patchy and confluent bilateral white matter hypodensity. No cortically based acute infarct identified. Vascular: Calcified atherosclerosis at the skull base. No suspicious intracranial vascular hyperdensity. Skull: Stable.  No fracture identified. Sinuses/Orbits: Stable opacified right mastoid air cells. Right tympanic cavity remains clear. Other Visualized paranasal sinuses and mastoids are stable and well aerated. Other: No orbit or scalp soft tissue injury identified. IMPRESSION: 1. Subtle, trace residual intraventricular hemorrhage now layering in the left occipital horn. Most likely this is posttraumatic. 2. No other acute intracranial abnormality. Chronic small vessel disease. 3. Stable right mastoid effusion. Electronically Signed   By: Genevie Ann M.D.   On: 08/26/2020 11:00   CT Head Wo Contrast  Result Date: 08/25/2020 CLINICAL DATA:  Possible head trauma. Additional provided: Patient found down. Dizziness. On blood thinners. Rule out fracture. EXAM: CT HEAD WITHOUT CONTRAST CT CERVICAL SPINE WITHOUT CONTRAST TECHNIQUE:  Multidetector CT imaging of the head and cervical spine was performed following the standard protocol without intravenous contrast. Multiplanar CT image reconstructions of the cervical spine were also generated. COMPARISON:  Prior head CT examinations 07/31/2020 and earlier. CT of the cervical spine 02/07/2020. Thyroid ultrasound 07/10/2020. FINDINGS: CT HEAD FINDINGS Brain: Mildly motion degraded exam. Moderate generalized  cerebral atrophy. A small volume acute hemorrhage within the atrium of the left lateral ventricle (series 4, image 15). Patchy and confluent hypoattenuation within the cerebral white matter, nonspecific but compatible with chronic small vessel ischemic disease. Redemonstrated small chronic lacunar infarct within the right cerebellar hemisphere (series 4, image 8). No demarcated cortical infarct. No extra-axial fluid collection. No evidence of intracranial mass. No midline shift. Vascular: No hyperdense vessel.  Atherosclerotic calcifications. Skull: Normal. Negative for fracture or focal suspicious osseous lesion. Sinuses/Orbits: Visualized orbits show no acute finding. Mild bilateral ethmoid sinus mucosal thickening. Trace left sphenoid sinus mucosal thickening. Other: Right mastoid effusion. Unchanged 1 cm right parotid nodule, suspicious for primary parotid neoplasm. CT CERVICAL SPINE FINDINGS Alignment: No significant spondylolisthesis. Partially imaged thoracic dextrocurvature. Skull base and vertebrae: The basion-dental and atlanto-dental intervals are maintained.No evidence of acute fracture to the cervical spine. Soft tissues and spinal canal: No prevertebral fluid or swelling. No visible canal hematoma. Disc levels: Cervical spondylosis with multilevel disc space narrowing, disc bulges, central disc protrusions, uncovertebral hypertrophy and facet arthrosis. No appreciable high-grade spinal canal stenosis. Multilevel bony neural foraminal narrowing. Upper chest: No consolidation within the imaged lung apices. No visible pneumothorax. Other: Redemonstrated 2 cm calcified right thyroid lobe nodule, by report previously biopsied on 07/10/2020. Correlate with any available pathology. CT head impression #2 called by telephone at the time of interpretation on 08/25/2020 at 11:19 am to provider MARGAUX VENTER , who verbally acknowledged these results. IMPRESSION: CT head: 1. Mildly motion degraded exam. 2.  Small-volume acute hemorrhage within the atrium of the left lateral ventricle. 3. Moderate/severe cerebral white matter chronic small vessel ischemic disease. 4. Redemonstrated small chronic lacunar infarct within the right cerebellar hemisphere. 5. Moderate cerebral atrophy with comparatively mild cerebellar atrophy. 6. Mild paranasal sinus disease at the imaged levels. 7. Right mastoid effusion. 8. Unchanged 1 cm right parotid nodule suspicious for primary parotid neoplasm. CT cervical spine: 1. No evidence of acute fracture in cervical spine. 2. Cervical spondylosis, as described. Electronically Signed   By: Kellie Simmering DO   On: 08/25/2020 11:22   CT HEAD WO CONTRAST  Result Date: 07/31/2020 CLINICAL DATA:  Status post fall. The patient is on anticoagulation therapy. EXAM: CT HEAD WITHOUT CONTRAST TECHNIQUE: Contiguous axial images were obtained from the base of the skull through the vertex without intravenous contrast. COMPARISON:  Head CT 06/28/2020. FINDINGS: Brain: No evidence of acute infarction, hemorrhage, hydrocephalus, extra-axial collection or mass lesion/mass effect. Atrophy and chronic microvascular ischemic change again seen. Vascular: No hyperdense vessel or unexpected calcification. Skull: No fracture or focal lesion. Sinuses/Orbits: Negative. Other: None. IMPRESSION: No acute abnormality. Atrophy and chronic microvascular ischemic change. Electronically Signed   By: Inge Rise M.D.   On: 07/31/2020 16:38   CT Cervical Spine Wo Contrast  Result Date: 08/25/2020 CLINICAL DATA:  Possible head trauma. Additional provided: Patient found down. Dizziness. On blood thinners. Rule out fracture. EXAM: CT HEAD WITHOUT CONTRAST CT CERVICAL SPINE WITHOUT CONTRAST TECHNIQUE: Multidetector CT imaging of the head and cervical spine was performed following the standard protocol without intravenous contrast. Multiplanar CT image reconstructions of the cervical spine were also  generated. COMPARISON:   Prior head CT examinations 07/31/2020 and earlier. CT of the cervical spine 02/07/2020. Thyroid ultrasound 07/10/2020. FINDINGS: CT HEAD FINDINGS Brain: Mildly motion degraded exam. Moderate generalized cerebral atrophy. A small volume acute hemorrhage within the atrium of the left lateral ventricle (series 4, image 15). Patchy and confluent hypoattenuation within the cerebral white matter, nonspecific but compatible with chronic small vessel ischemic disease. Redemonstrated small chronic lacunar infarct within the right cerebellar hemisphere (series 4, image 8). No demarcated cortical infarct. No extra-axial fluid collection. No evidence of intracranial mass. No midline shift. Vascular: No hyperdense vessel.  Atherosclerotic calcifications. Skull: Normal. Negative for fracture or focal suspicious osseous lesion. Sinuses/Orbits: Visualized orbits show no acute finding. Mild bilateral ethmoid sinus mucosal thickening. Trace left sphenoid sinus mucosal thickening. Other: Right mastoid effusion. Unchanged 1 cm right parotid nodule, suspicious for primary parotid neoplasm. CT CERVICAL SPINE FINDINGS Alignment: No significant spondylolisthesis. Partially imaged thoracic dextrocurvature. Skull base and vertebrae: The basion-dental and atlanto-dental intervals are maintained.No evidence of acute fracture to the cervical spine. Soft tissues and spinal canal: No prevertebral fluid or swelling. No visible canal hematoma. Disc levels: Cervical spondylosis with multilevel disc space narrowing, disc bulges, central disc protrusions, uncovertebral hypertrophy and facet arthrosis. No appreciable high-grade spinal canal stenosis. Multilevel bony neural foraminal narrowing. Upper chest: No consolidation within the imaged lung apices. No visible pneumothorax. Other: Redemonstrated 2 cm calcified right thyroid lobe nodule, by report previously biopsied on 07/10/2020. Correlate with any available pathology. CT head impression #2 called  by telephone at the time of interpretation on 08/25/2020 at 11:19 am to provider MARGAUX VENTER , who verbally acknowledged these results. IMPRESSION: CT head: 1. Mildly motion degraded exam. 2. Small-volume acute hemorrhage within the atrium of the left lateral ventricle. 3. Moderate/severe cerebral white matter chronic small vessel ischemic disease. 4. Redemonstrated small chronic lacunar infarct within the right cerebellar hemisphere. 5. Moderate cerebral atrophy with comparatively mild cerebellar atrophy. 6. Mild paranasal sinus disease at the imaged levels. 7. Right mastoid effusion. 8. Unchanged 1 cm right parotid nodule suspicious for primary parotid neoplasm. CT cervical spine: 1. No evidence of acute fracture in cervical spine. 2. Cervical spondylosis, as described. Electronically Signed   By: Kellie Simmering DO   On: 08/25/2020 11:22   ECHOCARDIOGRAM COMPLETE  Result Date: 08/26/2020    ECHOCARDIOGRAM REPORT   Patient Name:   LISSANDRA KEIL Date of Exam: 08/26/2020 Medical Rec #:  [id number]            Height:       66.0 in Accession #:    [id number]           Weight:       189.8 lb Date of Birth:  Jun 30, 1933            BSA:          1.956 m Patient Age:    87 years             BP:           123/92 mmHg Patient Gender: F                    HR:           81 bpm. Exam Location:  Inpatient Procedure: 2D Echo, Cardiac Doppler and Color Doppler Indications:    Murmur  History:        Patient has prior history of Echocardiogram examinations, most  recent 12/28/2018. Arrythmias:Atrial Fibrillation; Risk                 Factors:Hypertension, Diabetes and Former Smoker.  Sonographer:    Cammy Brochure Referring Phys: [id number] [place]  1. Left ventricular ejection fraction, by estimation, is 70 to 75%. The left ventricle has hyperdynamic function. The left ventricle has no regional wall motion abnormalities. There is mild left ventricular hypertrophy. Left ventricular diastolic  function could not be evaluated.  2. Right ventricular systolic function is mildly reduced. The right ventricular size is normal. There is mildly elevated pulmonary artery systolic pressure.  3. Left atrial size was severely dilated.  4. Right atrial size was mildly dilated.  5. The mitral valve is abnormal. Trivial mitral valve regurgitation.  6. The aortic valve is tricuspid. Mild sclerosis. Aortic valve regurgitation is not visualized. Aortic valve mean gradient measures 8.0 mmHg, DI is 0.50  7. The inferior vena cava is normal in size with greater than 50% respiratory variability, suggesting right atrial pressure of 3 mmHg. Comparison(s): Changes from prior study are noted. 12/28/2018: LVEF 60-65%, no aortic stenosis. FINDINGS  Left Ventricle: Left ventricular ejection fraction, by estimation, is 70 to 75%. The left ventricle has hyperdynamic function. The left ventricle has no regional wall motion abnormalities. The left ventricular internal cavity size was normal in size. There is mild left ventricular hypertrophy. Abnormal (paradoxical) septal motion, consistent with left bundle branch block. Left ventricular diastolic function could not be evaluated due to atrial fibrillation. Left ventricular diastolic function could not be evaluated. Right Ventricle: The right ventricular size is normal. No increase in right ventricular wall thickness. Right ventricular systolic function is mildly reduced. There is mildly elevated pulmonary artery systolic pressure. The tricuspid regurgitant velocity  is 3.03 m/s, and with an assumed right atrial pressure of 3 mmHg, the estimated right ventricular systolic pressure is 51.8 mmHg. Left Atrium: Left atrial size was severely dilated. Right Atrium: Right atrial size was mildly dilated. Pericardium: There is no evidence of pericardial effusion. Mitral Valve: The mitral valve is abnormal. There is moderate calcification of the anterior and posterior mitral valve leaflet(s). Mild  to moderate mitral annular calcification. Trivial mitral valve regurgitation. Tricuspid Valve: The tricuspid valve is grossly normal. Tricuspid valve regurgitation is trivial. Aortic Valve: The aortic valve is tricuspid. Aortic valve regurgitation is not visualized. Mild aortic valve sclerosis is present, with no evidence of aortic valve stenosis. Aortic valve mean gradient measures 8.0 mmHg. Pulmonic Valve: The pulmonic valve was normal in structure. Pulmonic valve regurgitation is not visualized. Aorta: The aortic root and ascending aorta are structurally normal, with no evidence of dilitation. Venous: The inferior vena cava is normal in size with greater than 50% respiratory variability, suggesting right atrial pressure of 3 mmHg. IAS/Shunts: No atrial level shunt detected by color flow Doppler.  LEFT VENTRICLE PLAX 2D LVIDd:         4.20 cm  Diastology LVIDs:         2.80 cm  LV e' medial:    4.68 cm/s LV PW:         1.30 cm  LV E/e' medial:  28.8 LV IVS:        1.10 cm  LV e' lateral:   5.77 cm/s LVOT diam:     2.00 cm  LV E/e' lateral: 23.4 LV SV:         55 LV SV Index:   28 LVOT Area:     3.14  cm  RIGHT VENTRICLE            IVC RV S prime:     8.38 cm/s  IVC diam: 1.10 cm LEFT ATRIUM              Index       RIGHT ATRIUM           Index LA diam:        5.00 cm  2.56 cm/m  RA Area:     20.30 cm LA Vol (A2C):   88.2 ml  45.09 ml/m RA Volume:   55.80 ml  28.53 ml/m LA Vol (A4C):   100.0 ml 51.13 ml/m LA Biplane Vol: 95.5 ml  48.83 ml/m  AORTIC VALVE AV Area (Vmax):    1.32 cm AV Area (Vmean):   1.45 cm AV Area (VTI):     1.56 cm AV Vmax:           194.00 cm/s AV Vmean:          132.000 cm/s AV VTI:            0.350 m AV Peak Grad:      15.1 mmHg AV Mean Grad:      8.0 mmHg LVOT Vmax:         81.70 cm/s LVOT Vmean:        61.000 cm/s LVOT VTI:          0.174 m LVOT/AV VTI ratio: 0.50  AORTA Ao Root diam: 2.40 cm MITRAL VALVE                TRICUSPID VALVE MV Area (PHT): 3.54 cm     TR Peak grad:    36.7 mmHg MV Decel Time: 214 msec     TR Vmax:        303.00 cm/s MV E velocity: 135.00 cm/s                             SHUNTS                             Systemic VTI:  0.17 m                             Systemic Diam: 2.00 cm Lyman Bishop MD Electronically signed by Lyman Bishop MD Signature Date/Time: 08/26/2020/1:09:56 PM    Final    (Echo, Carotid, EGD, Colonoscopy, ERCP)    Subjective: No complaints  Discharge Exam: Vitals:   08/29/20 0321 08/29/20 0814  BP: 126/72 130/64  Pulse:  (!) 53  Resp: 20 20  Temp: 98 F (36.7 C) 98.4 F (36.9 C)  SpO2: 96% 97%   Vitals:   08/28/20 2334 08/29/20 0321 08/29/20 0329 08/29/20 0814  BP: 123/62 126/72  130/64  Pulse: 67   (!) 53  Resp: 18 20  20   Temp: 98.6 F (37 C) 98 F (36.7 C)  98.4 F (36.9 C)  TempSrc: Oral Oral  Oral  SpO2: 95% 96%  97%  Weight:   89.4 kg   Height:        General: Pt is alert, awake, not in acute distress Cardiovascular: RRR, S1/S2 +, no rubs, no gallops Respiratory: CTA bilaterally, no wheezing, no rhonchi Abdominal: Soft, NT, ND, bowel sounds + Extremities: no edema, no cyanosis    The results of  significant diagnostics from this hospitalization (including imaging, microbiology, ancillary and laboratory) are listed below for reference.     Microbiology: Recent Results (from the past 240 hour(s))  Resp Panel by RT-PCR (Flu A&B, Covid) Nasopharyngeal Swab     Status: None   Collection Time: 08/25/20 12:12 PM   Specimen: Nasopharyngeal Swab; Nasopharyngeal(NP) swabs in vial transport medium  Result Value Ref Range Status   SARS Coronavirus 2 by RT PCR NEGATIVE NEGATIVE Final    Comment: (NOTE) SARS-CoV-2 target nucleic acids are NOT DETECTED.  The SARS-CoV-2 RNA is generally detectable in upper respiratory specimens during the acute phase of infection. The lowest concentration of SARS-CoV-2 viral copies this assay can detect is 138 copies/mL. A negative result does not preclude  SARS-Cov-2 infection and should not be used as the sole basis for treatment or other patient management decisions. A negative result may occur with  improper specimen collection/handling, submission of specimen other than nasopharyngeal swab, presence of viral mutation(s) within the areas targeted by this assay, and inadequate number of viral copies(<138 copies/mL). A negative result must be combined with clinical observations, patient history, and epidemiological information. The expected result is Negative.  Fact Sheet for Patients:  EntrepreneurPulse.com.au  Fact Sheet for Healthcare Providers:  IncredibleEmployment.be  This test is no t yet approved or cleared by the [place] FDA and  has been authorized for detection and/or diagnosis of SARS-CoV-2 by FDA under an Emergency Use Authorization (EUA). This EUA will remain  in effect (meaning this test can be used) for the duration of the COVID-19 declaration under Section 564(b)(1) of the Act, 21 U.S.C.section 360bbb-3(b)(1), unless the authorization is terminated  or revoked sooner.       Influenza A by PCR NEGATIVE NEGATIVE Final   Influenza B by PCR NEGATIVE NEGATIVE Final    Comment: (NOTE) The Xpert Xpress SARS-CoV-2/FLU/RSV plus assay is intended as an aid in the diagnosis of influenza from Nasopharyngeal swab specimens and should not be used as a sole basis for treatment. Nasal washings and aspirates are unacceptable for Xpert Xpress SARS-CoV-2/FLU/RSV testing.  Fact Sheet for Patients: EntrepreneurPulse.com.au  Fact Sheet for Healthcare Providers: IncredibleEmployment.be  This test is not yet approved or cleared by the [place] FDA and has been authorized for detection and/or diagnosis of SARS-CoV-2 by FDA under an Emergency Use Authorization (EUA). This EUA will remain in effect (meaning this test can be used) for the duration of  the COVID-19 declaration under Section 564(b)(1) of the Act, 21 U.S.C. section 360bbb-3(b)(1), unless the authorization is terminated or revoked.  Performed at [facility], [address], [place], [place] [address]   SARS CORONAVIRUS 2 (TAT 6-24 HRS) Nasopharyngeal Nasopharyngeal Swab     Status: None   Collection Time: 08/28/20  2:48 PM   Specimen: Nasopharyngeal Swab  Result Value Ref Range Status   SARS Coronavirus 2 NEGATIVE NEGATIVE Final    Comment: (NOTE) SARS-CoV-2 target nucleic acids are NOT DETECTED.  The SARS-CoV-2 RNA is generally detectable in upper and lower respiratory specimens during the acute phase of infection. Negative results do not preclude SARS-CoV-2 infection, do not rule out co-infections with other pathogens, and should not be used as the sole basis for treatment or other patient management decisions. Negative results must be combined with clinical observations, patient history, and epidemiological information. The expected result is Negative.  Fact Sheet for Patients: SugarRoll.be  Fact Sheet for Healthcare Providers: https://www.woods-mathews.com/  This test is not yet approved or cleared by  the [employer] and  has been authorized for detection and/or diagnosis of SARS-CoV-2 by FDA under an Emergency Use Authorization (EUA). This EUA will remain  in effect (meaning this test can be used) for the duration of the COVID-19 declaration under Se ction 564(b)(1) of the Act, 21 U.S.C. section 360bbb-3(b)(1), unless the authorization is terminated or revoked sooner.  Performed at [facility], [place] [address] [place], [place] [address]      Labs: BNP (last 3 results) No results for input(s): BNP in the last 8760 hours. Basic Metabolic Panel: Recent Labs  Lab 08/25/20 0922  NA 135  K 3.9  CL 100  CO2 26  GLUCOSE 222*  BUN 18  CREATININE 1.24*  CALCIUM 9.1   Liver Function  Tests: Recent Labs  Lab 08/25/20 0922  AST 18  ALT 16  ALKPHOS 116  BILITOT 1.5*  PROT 7.0  ALBUMIN 3.2*   No results for input(s): LIPASE, AMYLASE in the last 168 hours. No results for input(s): AMMONIA in the last 168 hours. CBC: Recent Labs  Lab 08/25/20 0922  WBC 10.5  NEUTROABS 8.8*  HGB 13.7  HCT 43.2  MCV 92.7  PLT 224   Cardiac Enzymes: Recent Labs  Lab 08/25/20 0922  CKTOTAL 30*   BNP: Invalid input(s): POCBNP CBG: Recent Labs  Lab 08/28/20 1207 08/28/20 1611 08/28/20 1935 08/28/20 2148 08/29/20 0623  GLUCAP 265* 91 180* 188* 174*   D-Dimer No results for input(s): DDIMER in the last 72 hours. Hgb A1c No results for input(s): HGBA1C in the last 72 hours. Lipid Profile No results for input(s): CHOL, HDL, LDLCALC, TRIG, CHOLHDL, LDLDIRECT in the last 72 hours. Thyroid function studies No results for input(s): TSH, T4TOTAL, T3FREE, THYROIDAB in the last 72 hours.  Invalid input(s): FREET3 Anemia work up No results for input(s): VITAMINB12, FOLATE, FERRITIN, TIBC, IRON, RETICCTPCT in the last 72 hours. Urinalysis    Component Value Date/Time   COLORURINE YELLOW 08/25/2020 1604   APPEARANCEUR CLEAR 08/25/2020 1604   LABSPEC 1.011 08/25/2020 1604   PHURINE 7.0 08/25/2020 1604   GLUCOSEU 150 (A) 08/25/2020 1604   HGBUR NEGATIVE 08/25/2020 [place] 08/25/2020 [place] 08/25/2020 1604   PROTEINUR 100 (A) 08/25/2020 1604   UROBILINOGEN 0.2 07/05/2009 0331   NITRITE NEGATIVE 08/25/2020 1604   LEUKOCYTESUR TRACE (A) 08/25/2020 1604   Sepsis Labs Invalid input(s): PROCALCITONIN,  WBC,  LACTICIDVEN Microbiology Recent Results (from the past 240 hour(s))  Resp Panel by RT-PCR (Flu A&B, Covid) Nasopharyngeal Swab     Status: None   Collection Time: 08/25/20 12:12 PM   Specimen: Nasopharyngeal Swab; Nasopharyngeal(NP) swabs in vial transport medium  Result Value Ref Range Status   SARS Coronavirus 2 by RT PCR  NEGATIVE NEGATIVE Final    Comment: (NOTE) SARS-CoV-2 target nucleic acids are NOT DETECTED.  The SARS-CoV-2 RNA is generally detectable in upper respiratory specimens during the acute phase of infection. The lowest concentration of SARS-CoV-2 viral copies this assay can detect is 138 copies/mL. A negative result does not preclude SARS-Cov-2 infection and should not be used as the sole basis for treatment or other patient management decisions. A negative result may occur with  improper specimen collection/handling, submission of specimen other than nasopharyngeal swab, presence of viral mutation(s) within the areas targeted by this assay, and inadequate number of viral copies(<138 copies/mL). A negative result must be combined with clinical observations, patient history, and epidemiological information. The expected result  is Negative.  Fact Sheet for Patients:  EntrepreneurPulse.com.au  Fact Sheet for Healthcare Providers:  IncredibleEmployment.be  This test is no t yet approved or cleared by the [place] FDA and  has been authorized for detection and/or diagnosis of SARS-CoV-2 by FDA under an Emergency Use Authorization (EUA). This EUA will remain  in effect (meaning this test can be used) for the duration of the COVID-19 declaration under Section 564(b)(1) of the Act, 21 U.S.C.section 360bbb-3(b)(1), unless the authorization is terminated  or revoked sooner.       Influenza A by PCR NEGATIVE NEGATIVE Final   Influenza B by PCR NEGATIVE NEGATIVE Final    Comment: (NOTE) The Xpert Xpress SARS-CoV-2/FLU/RSV plus assay is intended as an aid in the diagnosis of influenza from Nasopharyngeal swab specimens and should not be used as a sole basis for treatment. Nasal washings and aspirates are unacceptable for Xpert Xpress SARS-CoV-2/FLU/RSV testing.  Fact Sheet for Patients: EntrepreneurPulse.com.au  Fact Sheet for  Healthcare Providers: IncredibleEmployment.be  This test is not yet approved or cleared by the [place] FDA and has been authorized for detection and/or diagnosis of SARS-CoV-2 by FDA under an Emergency Use Authorization (EUA). This EUA will remain in effect (meaning this test can be used) for the duration of the COVID-19 declaration under Section 564(b)(1) of the Act, 21 U.S.C. section 360bbb-3(b)(1), unless the authorization is terminated or revoked.  Performed at [facility], [place] [address] [place], [place] [address]   SARS CORONAVIRUS 2 (TAT 6-24 HRS) Nasopharyngeal Nasopharyngeal Swab     Status: None   Collection Time: 08/28/20  2:48 PM   Specimen: Nasopharyngeal Swab  Result Value Ref Range Status   SARS Coronavirus 2 NEGATIVE NEGATIVE Final    Comment: (NOTE) SARS-CoV-2 target nucleic acids are NOT DETECTED.  The SARS-CoV-2 RNA is generally detectable in upper and lower respiratory specimens during the acute phase of infection. Negative results do not preclude SARS-CoV-2 infection, do not rule out co-infections with other pathogens, and should not be used as the sole basis for treatment or other patient management decisions. Negative results must be combined with clinical observations, patient history, and epidemiological information. The expected result is Negative.  Fact Sheet for Patients: SugarRoll.be  Fact Sheet for Healthcare Providers: https://www.woods-mathews.com/  This test is not yet approved or cleared by the [place] FDA and  has been authorized for detection and/or diagnosis of SARS-CoV-2 by FDA under an Emergency Use Authorization (EUA). This EUA will remain  in effect (meaning this test can be used) for the duration of the COVID-19 declaration under Se ction 564(b)(1) of the Act, 21 U.S.C. section 360bbb-3(b)(1), unless the authorization is terminated or revoked  sooner.  Performed at [facility], [place] [address] [place], [place] [address]      Time coordinating discharge: Over 40 minutes  SIGNED:   Charlynne Cousins, MD  [facility] 08/29/2020, 9:38 AM Pager   If 7PM-7AM, please contact night-coverage [email or web address] Password [email or web address]

## 2020-08-29 NOTE — Progress Notes (Signed)
[facility] PROGRESS NOTE    Progress Note  Diana [name]  NWG:[id number] DOB: [birth year] DOA: 08/25/2020 PCP: Christain Sacramento, MD     Brief Narrative:   Diana [name] is an 87 y.o. female past medical history significant for ambulatory dysfunction with frequent falls, paroxysmal atrial fibrillation on Eliquis, mild dementia insulin-dependent diabetes mellitus, chronic kidney disease stage III presents with fall.  According to assisted living facility records the patient was found on the floor, according to his sister-in-law she has had several falls this year.   CT of the head shows small volume acute hemorrhage within the right atrium of the left ventricle, cerebral white matter chronic small vessel disease, small chronic lacunar infarct on the right.  Moderate cerebral atrophy.  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 Resume her antihypertensive medication.  Goal blood pressure less than 160/100. Not Candidate for Eliquis in the future.    Patient is a from an independent facility and needed to be placed at an ALF due to physical therapy needs, but there is a waiting list and a left so the patient is unable to transition at this time    Assessment/Plan:   Intracranial hemorrhage [facility]) She is will require assisted living facility placement with physical therapy, as she can now go back to independent living facility. Neurologically she continues to improve significantly.  Essential hypertension: Continue Norvasc hydralazine and losartan blood pressure is relatively  controlled.  Chronic atrial fibrillation: Rate controlled, not a candidate for anticoagulation due to frequent falls.  Insulin-dependent diabetes mellitus uncontrolled A1c is 8.4 Here she is on short acting insulin plus metformin, we will go ahead and increase her metformin we will have to go on low-dose Lantus.  Right distal tibia fracture: X-ray of the  ankle showed probable nondisplaced fracture involving the distal fibula and only seen in oblique images.  Extensive soft tissue swelling. Orthopedic physician reviewed the imaging recommended cam boot and weightbearing as tolerated follow-up with them as an outpatient.  Chronic kidney disease stage III: Creatinine appears to be at baseline.  Heart murmur: Echo showed no acute findings.  DVT prophylaxis: scd Family Communication:none Status is: Inpatient  Remains inpatient appropriate because:Hemodynamically unstable  Dispo: The patient is from: Independent living facility              Anticipated d/c is to: ALF              Patient currently is not medically stable to d/c.   Difficult to place patient No      Code Status:     Code Status Orders  (From admission, onward)           Start     Ordered   08/25/20 1354  Full code  Continuous        08/25/20 1356           Code Status History     Date Active Date Inactive Code Status Order ID Comments User Context   07/01/2020 0314 07/06/2020 1917 Full Code [id number]  Chotiner, Yevonne Aline, MD ED   03/21/2020 1438 03/28/2020 2355 Full Code [id number]  Vivien Rota Inpatient   12/31/2018 0316 01/07/2019 2130 Full Code [id number]  [name], MD ED         IV Access:   Peripheral IV   Procedures and diagnostic studies:   No results found.   Medical Consultants:   None.   Subjective:  Diana [name] no complaints  Objective:    Vitals:   08/28/20 2334 08/29/20 0321 08/29/20 0329 08/29/20 0814  BP: 123/62 126/72  130/64  Pulse: 67   (!) 53  Resp: 18 20  20   Temp: 98.6 F (37 C) 98 F (36.7 C)  98.4 F (36.9 C)  TempSrc: Oral Oral  Oral  SpO2: 95% 96%  97%  Weight:   89.4 kg   Height:       SpO2: 97 %   Intake/Output Summary (Last 24 hours) at 08/29/2020 0916 Last data filed at 08/29/2020 1761 Gross per 24 hour  Intake 240 ml  Output 2750 ml  Net -2510 ml     Filed Weights   08/27/20 0500 08/28/20 0500 08/29/20 0329  Weight: 88.6 kg 90 kg 89.4 kg    Exam: General exam: In no acute distress. Respiratory system: Good air movement and clear to auscultation. Cardiovascular system: S1 & S2 heard, RRR. No JVD. Gastrointestinal system: Abdomen is nondistended, soft and nontender.  Extremities: No pedal edema. Skin: No rashes, lesions or ulcers  Data Reviewed:    Labs: Basic Metabolic Panel: Recent Labs  Lab 08/25/20 0922  NA 135  K 3.9  CL 100  CO2 26  GLUCOSE 222*  BUN 18  CREATININE 1.24*  CALCIUM 9.1    GFR Estimated Creatinine Clearance: 37.3 mL/min (A) (by C-G formula based on SCr of 1.24 mg/dL (H)). Liver Function Tests: Recent Labs  Lab 08/25/20 0922  AST 18  ALT 16  ALKPHOS 116  BILITOT 1.5*  PROT 7.0  ALBUMIN 3.2*    No results for input(s): LIPASE, AMYLASE in the last 168 hours. No results for input(s): AMMONIA in the last 168 hours. Coagulation profile No results for input(s): INR, PROTIME in the last 168 hours. COVID-19 Labs  No results for input(s): DDIMER, FERRITIN, LDH, CRP in the last 72 hours.  Lab Results  Component Value Date   SARSCOV2NAA NEGATIVE 08/28/2020   SARSCOV2NAA NEGATIVE 08/25/2020   [place] NEGATIVE 07/05/2020   [place] NEGATIVE 06/30/2020    CBC: Recent Labs  Lab 08/25/20 0922  WBC 10.5  NEUTROABS 8.8*  HGB 13.7  HCT 43.2  MCV 92.7  PLT 224    Cardiac Enzymes: Recent Labs  Lab 08/25/20 0922  CKTOTAL 30*    BNP (last 3 results) No results for input(s): PROBNP in the last 8760 hours. CBG: Recent Labs  Lab 08/28/20 1207 08/28/20 1611 08/28/20 1935 08/28/20 2148 08/29/20 0623  GLUCAP 265* 91 180* 188* 174*    D-Dimer: No results for input(s): DDIMER in the last 72 hours. Hgb A1c: No results for input(s): HGBA1C in the last 72 hours.  Lipid Profile: No results for input(s): CHOL, HDL, LDLCALC, TRIG, CHOLHDL, LDLDIRECT in the last 72  hours. Thyroid function studies: No results for input(s): TSH, T4TOTAL, T3FREE, THYROIDAB in the last 72 hours.  Invalid input(s): FREET3 Anemia work up: No results for input(s): VITAMINB12, FOLATE, FERRITIN, TIBC, IRON, RETICCTPCT in the last 72 hours. Sepsis Labs: Recent Labs  Lab 08/25/20 0922  WBC 10.5    Microbiology Recent Results (from the past 240 hour(s))  Resp Panel by RT-PCR (Flu A&B, Covid) Nasopharyngeal Swab     Status: None   Collection Time: 08/25/20 12:12 PM   Specimen: Nasopharyngeal Swab; Nasopharyngeal(NP) swabs in vial transport medium  Result Value Ref Range Status   SARS Coronavirus 2 by RT PCR NEGATIVE NEGATIVE Final    Comment: (NOTE) SARS-CoV-2 target nucleic acids are  NOT DETECTED.  The SARS-CoV-2 RNA is generally detectable in upper respiratory specimens during the acute phase of infection. The lowest concentration of SARS-CoV-2 viral copies this assay can detect is 138 copies/mL. A negative result does not preclude SARS-Cov-2 infection and should not be used as the sole basis for treatment or other patient management decisions. A negative result may occur with  improper specimen collection/handling, submission of specimen other than nasopharyngeal swab, presence of viral mutation(s) within the areas targeted by this assay, and inadequate number of viral copies(<138 copies/mL). A negative result must be combined with clinical observations, patient history, and epidemiological information. The expected result is Negative.  Fact Sheet for Patients:  EntrepreneurPulse.com.au  Fact Sheet for Healthcare Providers:  IncredibleEmployment.be  This test is no t yet approved or cleared by the [place] FDA and  has been authorized for detection and/or diagnosis of SARS-CoV-2 by FDA under an Emergency Use Authorization (EUA). This EUA will remain  in effect (meaning this test can be used) for the duration of  the COVID-19 declaration under Section 564(b)(1) of the Act, 21 U.S.C.section 360bbb-3(b)(1), unless the authorization is terminated  or revoked sooner.       Influenza A by PCR NEGATIVE NEGATIVE Final   Influenza B by PCR NEGATIVE NEGATIVE Final    Comment: (NOTE) The Xpert Xpress SARS-CoV-2/FLU/RSV plus assay is intended as an aid in the diagnosis of influenza from Nasopharyngeal swab specimens and should not be used as a sole basis for treatment. Nasal washings and aspirates are unacceptable for Xpert Xpress SARS-CoV-2/FLU/RSV testing.  Fact Sheet for Patients: EntrepreneurPulse.com.au  Fact Sheet for Healthcare Providers: IncredibleEmployment.be  This test is not yet approved or cleared by the [place] FDA and has been authorized for detection and/or diagnosis of SARS-CoV-2 by FDA under an Emergency Use Authorization (EUA). This EUA will remain in effect (meaning this test can be used) for the duration of the COVID-19 declaration under Section 564(b)(1) of the Act, 21 U.S.C. section 360bbb-3(b)(1), unless the authorization is terminated or revoked.  Performed at [facility], [place] [address] [place], [place] [address]   SARS CORONAVIRUS 2 (TAT 6-24 HRS) Nasopharyngeal Nasopharyngeal Swab     Status: None   Collection Time: 08/28/20  2:48 PM   Specimen: Nasopharyngeal Swab  Result Value Ref Range Status   SARS Coronavirus 2 NEGATIVE NEGATIVE Final    Comment: (NOTE) SARS-CoV-2 target nucleic acids are NOT DETECTED.  The SARS-CoV-2 RNA is generally detectable in upper and lower respiratory specimens during the acute phase of infection. Negative results do not preclude SARS-CoV-2 infection, do not rule out co-infections with other pathogens, and should not be used as the sole basis for treatment or other patient management decisions. Negative results must be combined with clinical observations, patient history, and  epidemiological information. The expected result is Negative.  Fact Sheet for Patients: SugarRoll.be  Fact Sheet for Healthcare Providers: https://www.woods-mathews.com/  This test is not yet approved or cleared by the [place] FDA and  has been authorized for detection and/or diagnosis of SARS-CoV-2 by FDA under an Emergency Use Authorization (EUA). This EUA will remain  in effect (meaning this test can be used) for the duration of the COVID-19 declaration under Se ction 564(b)(1) of the Act, 21 U.S.C. section 360bbb-3(b)(1), unless the authorization is terminated or revoked sooner.  Performed at [facility], [place] [address], [place],  [address]      Medications:    timolol  1 drop  Both Eyes BID   And   brimonidine  1 drop Both Eyes BID   ferrous sulfate  325 mg Oral q morning   FLUoxetine  20 mg Oral Daily   insulin aspart  0-15 Units Subcutaneous TID WC   insulin aspart  0-5 Units Subcutaneous QHS   insulin aspart  3 Units Subcutaneous TID WC   insulin glargine  5 Units Subcutaneous BID   levothyroxine  50 mcg Oral Daily   losartan  50 mg Oral BID   metFORMIN  1,000 mg Oral Q breakfast   metoprolol tartrate  12.5 mg Oral BID   multivitamin  1 tablet Oral q morning   pantoprazole  40 mg Oral BID AC   rosuvastatin  10 mg Oral QHS   sodium chloride flush  3 mL Intravenous Q12H   Continuous Infusions:    LOS: 3 days   Charlynne Cousins  [facility]  08/29/2020, 9:16 AM

## 2020-08-29 NOTE — TOC Transition Note (Signed)
Transition of Care [facility] - CM/SW Discharge Note   Patient Details  Name: Diana [name] MRN: [id number] Date of Birth: [birth year]  Transition of Care [facility]) CM/SW Contact:  Joanne Chars, LCSW Phone Number: 08/29/2020, 10:35 AM   Clinical Narrative:   Pt discharging to [facility].  RN call report to [phone].    Insurance auth received: 3 days starting 6/22 with review on 6/24.  [id number].  Plan Auth [id number].      Final next level of care: Skilled Nursing Facility Barriers to Discharge: Barriers Resolved   Patient Goals and CMS Choice Patient states their goals for this hospitalization and ongoing recovery are:: patient unable to participate in goal setting, only oriented to self CMS Medicare.gov Compare Post Acute Care list provided to:: Patient Represenative (must comment) Choice offered to / list presented to : Sibling  Discharge Placement              Patient [facility] bed at: [facility] Patient to be transferred to facility by: [place] Name of family member notified: Lelon Frohlich, sister in law Patient and family notified of of transfer: 08/29/20  Discharge Plan and Services     Post Acute Care Choice: [facility]                               Social Determinants of Health (SDOH) Interventions     Readmission Risk Interventions Readmission Risk Prevention Plan 07/06/2020  Transportation Screening Complete  PCP or Specialist Appt within 5-7 Days Complete  Home Care Screening Complete  Medication Review (RN CM) Complete  Some recent data might be hidden

## 2020-08-29 NOTE — Progress Notes (Signed)
Occupational Therapy Treatment Patient Details Name: ATTALLAH ONTKO MRN: [id number] DOB: May 17, 1933 Today's Date: 08/29/2020    History of present illness 87 y.o. female presents with fall.  Pt's records from ALF and family report mutliple falls.  CT of the head shows small volume acute hemorrhage within the right atrium of the left ventricle, small chronic lacunar infarct on the right.  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  Pneoplasm. CT cervical C-spine: negative. Pt with R dital fibular fx.  PMHx: ambulatory dysfunction with frequent falls, paroxysmal atrial fibrillation on Eliquis, mild dementia, IDDM, chronic kidney disease stage III   OT comments  Patient able to participate with OT this date; needing min guard for transfers, assist with CAM walker, and setup for seated grooming.  Barrier is current cognitive status and ability to understand reasoning for being in the hospital, and comprehension of plan of care.  Agree that given continued falls, patient most likely needs a higher level of care for day to day care and safety.  SNF is being recommended by PT.     Follow Up Recommendations  Supervision/Assistance - 24 hour   and SNF level care.     Equipment Recommendations  None recommended by OT    Recommendations for Other Services      Precautions / Restrictions Precautions Precautions: Fall;Other (comment) Precaution Comments: wear CAM boot for WBAT Required Braces or Orthoses: Other Brace Other Brace: R CAM boot Restrictions Weight Bearing Restrictions: Yes RLE Weight Bearing: Weight bearing as tolerated Other Position/Activity Restrictions: WBAT with Cam walker boot.       Mobility Bed Mobility   Bed Mobility: Supine to Sit     Supine to sit: Modified independent (Device/Increase time);HOB elevated       Patient Response: Cooperative  Transfers Overall transfer level: Needs assistance Equipment used: Rolling  walker (2 wheeled) Transfers: Sit to/from [facility] Sit to Stand: Min guard Stand pivot transfers: Min guard       General transfer comment: safety and blocking of R foot - sliding with CAM on.    Balance   Sitting-balance support: No upper extremity supported;Bilateral upper extremity supported;Feet supported Sitting balance-Leahy Scale: Good     Standing balance support: Bilateral upper extremity supported Standing balance-Leahy Scale: Poor Standing balance comment: reliant on AD for safety as of this evaluation                           ADL either performed or assessed with clinical judgement   ADL                                         General ADL Comments: patient able to complete seated grooming task this date with setup.                       Cognition Arousal/Alertness: Awake/alert Behavior During Therapy: WFL for tasks assessed/performed Overall Cognitive Status: History of cognitive impairments - at baseline                                                            Pertinent  Vitals/ Pain       Pain Assessment: Faces Faces Pain Scale: No hurt Pain Intervention(s): Monitored during session                                                          Frequency  Min 2X/week        Progress Toward Goals  OT Goals(current goals can now be found in the care plan section)  Progress towards OT goals: Progressing toward goals  Acute Rehab OT Goals OT Goal Formulation: Patient unable to participate in goal setting Time For Goal Achievement: 09/09/20 Potential to Achieve Goals: Good  Plan Discharge plan remains appropriate    Co-evaluation                 AM-PAC OT "6 Clicks" Daily Activity     Outcome Measure   Help from another person eating meals?: A Little Help from another person taking care of personal grooming?: A Little Help from  another person toileting, which includes using toliet, bedpan, or urinal?: A Lot   Help from another person to put on and taking off regular upper body clothing?: A Little Help from another person to put on and taking off regular lower body clothing?: A Lot 6 Click Score: 13    End of Session Equipment Utilized During Treatment: Rolling walker;Other (comment) (CAM on R foot)  OT Visit Diagnosis: Unsteadiness on feet (R26.81);Muscle weakness (generalized) (M62.81);Pain;Other symptoms and signs involving cognitive function Pain - Right/Left: Right Pain - part of body: Ankle and joints of foot   Activity Tolerance Patient tolerated treatment well   Patient Left in chair;with call bell/phone within reach;with chair alarm set   Nurse Communication          Time: [phone] OT Time Calculation (min): 16 min  Charges: OT General Charges $OT Visit: 1 Visit OT Treatments $Self Care/Home Management : 8-22 mins  08/29/2020  Rich, OTR/L  Acute Rehabilitation Services  Office:  [place] 08/29/2020, 9:57 AM

## 2020-09-25 DIAGNOSIS — F03A0 Unspecified dementia, mild, without behavioral disturbance, psychotic disturbance, mood disturbance, and anxiety: Secondary | ICD-10-CM | POA: Insufficient documentation

## 2020-11-30 ENCOUNTER — Emergency Department (HOSPITAL_COMMUNITY): Payer: Medicare PPO

## 2020-11-30 ENCOUNTER — Other Ambulatory Visit: Payer: Self-pay

## 2020-11-30 ENCOUNTER — Emergency Department (HOSPITAL_COMMUNITY)
Admission: EM | Admit: 2020-11-30 | Discharge: 2020-11-30 | Disposition: A | Discharge status: Home / Self Care | Payer: Medicare PPO | Attending: Emergency Medicine | Admitting: Emergency Medicine

## 2020-11-30 DIAGNOSIS — E039 Hypothyroidism, unspecified: Secondary | ICD-10-CM | POA: Insufficient documentation

## 2020-11-30 DIAGNOSIS — F0390 Unspecified dementia without behavioral disturbance: Secondary | ICD-10-CM | POA: Insufficient documentation

## 2020-11-30 DIAGNOSIS — Z9013 Acquired absence of bilateral breasts and nipples: Secondary | ICD-10-CM | POA: Insufficient documentation

## 2020-11-30 DIAGNOSIS — I129 Hypertensive chronic kidney disease with stage 1 through stage 4 chronic kidney disease, or unspecified chronic kidney disease: Secondary | ICD-10-CM | POA: Insufficient documentation

## 2020-11-30 DIAGNOSIS — N183 Chronic kidney disease, stage 3 unspecified: Secondary | ICD-10-CM | POA: Diagnosis not present

## 2020-11-30 DIAGNOSIS — N3000 Acute cystitis without hematuria: Secondary | ICD-10-CM | POA: Diagnosis not present

## 2020-11-30 DIAGNOSIS — R82998 Other abnormal findings in urine: Secondary | ICD-10-CM | POA: Insufficient documentation

## 2020-11-30 DIAGNOSIS — E1122 Type 2 diabetes mellitus with diabetic chronic kidney disease: Secondary | ICD-10-CM | POA: Insufficient documentation

## 2020-11-30 DIAGNOSIS — Z87891 Personal history of nicotine dependence: Secondary | ICD-10-CM | POA: Diagnosis not present

## 2020-11-30 DIAGNOSIS — S0083XA Contusion of other part of head, initial encounter: Secondary | ICD-10-CM | POA: Insufficient documentation

## 2020-11-30 DIAGNOSIS — S0993XA Unspecified injury of face, initial encounter: Secondary | ICD-10-CM | POA: Diagnosis present

## 2020-11-30 DIAGNOSIS — Z7984 Long term (current) use of oral hypoglycemic drugs: Secondary | ICD-10-CM | POA: Diagnosis not present

## 2020-11-30 DIAGNOSIS — Z853 Personal history of malignant neoplasm of breast: Secondary | ICD-10-CM | POA: Insufficient documentation

## 2020-11-30 DIAGNOSIS — Z79899 Other long term (current) drug therapy: Secondary | ICD-10-CM | POA: Diagnosis not present

## 2020-11-30 DIAGNOSIS — Z794 Long term (current) use of insulin: Secondary | ICD-10-CM | POA: Insufficient documentation

## 2020-11-30 DIAGNOSIS — W19XXXA Unspecified fall, initial encounter: Secondary | ICD-10-CM | POA: Diagnosis not present

## 2020-11-30 DIAGNOSIS — I10 Essential (primary) hypertension: Secondary | ICD-10-CM

## 2020-11-30 LAB — URINALYSIS, MICROSCOPIC (REFLEX)
RBC / HPF: NONE SEEN RBC/hpf (ref 0–5)
Squamous Epithelial / HPF: NONE SEEN (ref 0–5)

## 2020-11-30 LAB — CBC WITH DIFFERENTIAL/PLATELET
Abs Immature Granulocytes: 0.02 10*3/uL (ref 0.00–0.07)
Basophils Absolute: 0 10*3/uL (ref 0.0–0.1)
Basophils Relative: 1 %
Eosinophils Absolute: 0.3 10*3/uL (ref 0.0–0.5)
Eosinophils Relative: 3 %
HCT: 43.7 % (ref 36.0–46.0)
Hemoglobin: 14.2 g/dL (ref 12.0–15.0)
Immature Granulocytes: 0 %
Lymphocytes Relative: 12 %
Lymphs Abs: 0.9 10*3/uL (ref 0.7–4.0)
MCH: 30 pg (ref 26.0–34.0)
MCHC: 32.5 g/dL (ref 30.0–36.0)
MCV: 92.4 fL (ref 80.0–100.0)
Monocytes Absolute: 0.7 10*3/uL (ref 0.1–1.0)
Monocytes Relative: 9 %
Neutro Abs: 5.7 10*3/uL (ref 1.7–7.7)
Neutrophils Relative %: 75 %
Platelets: 161 10*3/uL (ref 150–400)
RBC: 4.73 MIL/uL (ref 3.87–5.11)
RDW: 13.4 % (ref 11.5–15.5)
WBC: 7.7 10*3/uL (ref 4.0–10.5)
nRBC: 0 % (ref 0.0–0.2)

## 2020-11-30 LAB — BASIC METABOLIC PANEL
Anion gap: 8 (ref 5–15)
BUN: 27 mg/dL — ABNORMAL HIGH (ref 8–23)
CO2: 26 mmol/L (ref 22–32)
Calcium: 8.7 mg/dL — ABNORMAL LOW (ref 8.9–10.3)
Chloride: 103 mmol/L (ref 98–111)
Creatinine, Ser: 1.31 mg/dL — ABNORMAL HIGH (ref 0.44–1.00)
GFR, Estimated: 40 mL/min — ABNORMAL LOW (ref >60)
Glucose, Bld: 249 mg/dL — ABNORMAL HIGH (ref 70–99)
Potassium: 4.5 mmol/L (ref 3.5–5.1)
Sodium: 137 mmol/L (ref 135–145)

## 2020-11-30 LAB — URINALYSIS, ROUTINE W REFLEX MICROSCOPIC
Bilirubin Urine: NEGATIVE
Glucose, UA: NEGATIVE mg/dL
Hgb urine dipstick: NEGATIVE
Ketones, ur: NEGATIVE mg/dL
Leukocytes,Ua: NEGATIVE
Nitrite: POSITIVE — AB
Protein, ur: 100 mg/dL — AB
Specific Gravity, Urine: 1.025 (ref 1.005–1.030)
pH: 7 (ref 5.0–8.0)

## 2020-11-30 MED ORDER — CEPHALEXIN 500 MG PO CAPS: 500.0000 mg | ORAL_CAPSULE | Freq: Three times a day (TID) | ORAL | 0 refills | Status: DC

## 2020-11-30 MED ORDER — SODIUM CHLORIDE 0.9 % IV SOLN
1.0000 g | Freq: Once | INTRAVENOUS | Status: AC
  Administered 2020-11-30: 1 g via INTRAVENOUS
  Filled 2020-11-30: qty 10

## 2020-11-30 MED ORDER — CLONIDINE HCL 0.2 MG PO TABS
0.2000 mg | ORAL_TABLET | Freq: Once | ORAL | Status: AC
  Administered 2020-11-30: 0.2 mg via ORAL
  Filled 2020-11-30: qty 1

## 2020-11-30 NOTE — ED Provider Notes (Signed)
[facility] EMERGENCY DEPARTMENT Provider Note   CSN: [id number] Arrival date & time: 11/30/20  1231     History Chief Complaint  Patient presents with   Lytle Michaels    Diana [name] is a 87 y.o. female.  HPI  87 year old female with past medical history of dementia, alert and oriented to herself at baseline, HTN, HLD, DM presents the emergency department with concern for multiple falls and strong smelling urine.  Patient is from a facility.  They state that since last night she has had 2 falls, unknown nature of the falls, no reported acute injuries from the falls.  They also report that she has been having strong smelling urine but otherwise acting herself in terms of mental status.  No recently reported fever, nausea/vomiting/diarrhea.  Past Medical History:  Diagnosis Date   Anemia    Anemia of decreased vitamin B12 absorption 05/26/2005   Arthritis    Atrial fibrillation ([place])    Breast cancer ([place]) 2001   Cataract    GERD (gastroesophageal reflux disease)    Hiatal hernia    History of tobacco abuse    Hx of colonic polyps    Hyperlipidemia    Hypertension    Hypothyroidism    Memory loss    Obesity    OSA on CPAP 09/2007   AHI 10.6/hr overall, 43.64/hr during REM, lost weight, does not use cpap   Osteopenia    Renal insufficiency    Type 2 diabetes mellitus ([place])     Patient Active Problem List   Diagnosis Date Noted   Intracranial bleed ([place]) 08/28/2020   Intracranial hemorrhage ([place]) 08/25/2020   Generalized weakness 06/30/2020   CKD (chronic kidney disease) stage 3, GFR 30-59 ml/min ([place]) 06/30/2020   Dyspnea 06/30/2020   Influenza A 06/30/2020   Closed dislocation of left elbow 03/19/2020   Depression, recurrent ([place]) 01/21/2019   Hyperlipidemia associated with type 2 diabetes mellitus ([place]) 70/62/3762   Acute metabolic encephalopathy 83/15/1761   Fall 01/11/2019   Klebsiella UTI (urinary tract infection) 01/11/2019   Acute urinary  retention 01/11/2019   Left kidney lesion 01/11/2019   Ulcerative esophagitis 01/11/2019   Persistent atrial fibrillation ([place])    Anemia 01/01/2019   Hiatal hernia    Symptomatic anemia 12/31/2018   Hypoglycemia 12/31/2018   Ribs, multiple fractures 12/31/2018   HTN (hypertension) 05/15/2013   OSA on CPAP 05/15/2013   Hypothyroidism 05/15/2013   Secondary DM with CKD stage 3 and hypertension ([place]) 05/15/2013   Lower extremity edema 05/15/2013   Renal insufficiency 05/15/2013   Morbid obesity ([place]) 05/15/2013   Breast cancer ([place]) 03/18/2011    Past Surgical History:  Procedure Laterality Date   BACK SURGERY  05/30/2009   BIOPSY  01/01/2019   Procedure: BIOPSY;  Surgeon: Milus Banister, MD;  Location: [place];  Service: Endoscopy;;   BOWEL RESECTION  1974   CATARACT EXTRACTION Bilateral    bilateral   CHOLECYSTECTOMY  1974   COLONOSCOPY     ESOPHAGOGASTRODUODENOSCOPY N/A 01/01/2019   Procedure: ESOPHAGOGASTRODUODENOSCOPY (EGD);  Surgeon: Milus Banister, MD;  Location: [facility] ENDOSCOPY;  Service: Endoscopy;  Laterality: N/A;   EXTERNAL FIXATION REMOVAL Left 04/27/2020   Procedure: REMOVAL EXTERNAL FIXATION ARM;  Surgeon: Shona Needles, MD;  Location: [place];  Service: Orthopedics;  Laterality: Left;   MASTECTOMY Bilateral 2001   bilateral sentinel lymph nodes bio   NM MYOCAR PERF WALL MOTION  06/2007   dipyridamole; perfusion defect in inferior myocardium  consistent with diaphragmatic attenuation, remaining myocardium with no ischemia/infarct, EF 73%; normal, low risk scan    ORIF ELBOW FRACTURE Left 03/21/2020   Procedure: OPEN REDUCTION INTERNAL FIXATION (ORIF) ELBOW/OLECRANON FRACTURE;  Surgeon: Shona Needles, MD;  Location: [place];  Service: Orthopedics;  Laterality: Left;   TOTAL ABDOMINAL HYSTERECTOMY  1975   TRANSTHORACIC ECHOCARDIOGRAM  02/2010   AS=>34%, stage 1 diastolic dysfunction; borderline RV enlargement; LA mild-mod dilated; mild mitral annular calcif & mild  MR; mild TR with normal RSVP, AV moderately sclerotics     OB History   No obstetric history on file.     Family History  Problem Relation Age of Onset   Heart attack Mother    Heart attack Father    Colon cancer Neg Hx     Social History   Tobacco Use   Smoking status: Former    Years: 13.00    Types: Cigarettes    Quit date: 05/02/2002    Years since quitting: 18.5   Smokeless tobacco: Never  Vaping Use   Vaping Use: Never used  Substance Use Topics   Alcohol use: No    Alcohol/week: 0.0 standard drinks   Drug use: Never    Home Medications Prior to Admission medications   Medication Sig Start Date End Date Taking? Authorizing Provider  brimonidine-timolol (COMBIGAN) 0.2-0.5 % ophthalmic solution Place 1 drop into both eyes every 12 (twelve) hours. 05/26/20   Medina-Vargas, Monina C, NP  FLUoxetine (PROZAC) 20 MG capsule Take 1 capsule (20 mg total) by mouth daily. 05/26/20   Medina-Vargas, Monina C, NP  insulin glargine (LANTUS) 100 UNIT/ML Solostar Pen Inject 7 Units into the skin at bedtime. 07/06/20   Caren Griffins, MD  levothyroxine (SYNTHROID) 50 MCG tablet Take 1 tablet (50 mcg total) by mouth daily. 05/26/20   Medina-Vargas, Monina C, NP  losartan (COZAAR) 50 MG tablet Take 1 tablet (50 mg total) by mouth 2 (two) times daily. 05/26/20   Medina-Vargas, Monina C, NP  metFORMIN (GLUCOPHAGE) 500 MG tablet Take 500 mg by mouth daily with breakfast.    [provider]  metFORMIN (GLUCOPHAGE) 500 MG tablet Take 2 tablets (1,000 mg total) by mouth daily with breakfast. 08/30/20   Charlynne Cousins, MD  metoprolol tartrate (LOPRESSOR) 25 MG tablet Take 0.5 tablets (12.5 mg total) by mouth 2 (two) times daily. 05/26/20   Medina-Vargas, Monina C, NP  Multiple Vitamins-Minerals (PRESERVISION AREDS 2 PO) Take 1 capsule by mouth every morning.    [provider]  pantoprazole (PROTONIX) 40 MG tablet Take 1 tablet (40 mg total) by mouth 2 (two) times daily  before a meal. 05/26/20   Medina-Vargas, Monina C, NP  rosuvastatin (CRESTOR) 10 MG tablet Take 10 mg by mouth at bedtime.    [provider]    Allergies    Codeine sulfate, Sps [sodium polystyrene sulfonate], and Sulfa antibiotics  Review of Systems   Review of Systems  Unable to perform ROS: Dementia   Physical Exam Updated Vital Signs BP (!) 191/104 (BP Location: Right Arm)   Pulse 67   Temp 97.8 F (36.6 C) (Oral)   Resp 16   LMP  (LMP Unknown)   SpO2 96%   Physical Exam Vitals and nursing note reviewed.  Constitutional:      Appearance: Normal appearance.  HENT:     Head: Normocephalic.     Comments: Bruising on chin    Right Ear: External ear normal.     Left  Ear: External ear normal.     Nose: Nose normal.     Mouth/Throat:     Mouth: Mucous membranes are moist.  Eyes:     Pupils: Pupils are equal, round, and reactive to light.  Cardiovascular:     Rate and Rhythm: Normal rate.  Pulmonary:     Effort: Pulmonary effort is normal. No respiratory distress.  Abdominal:     Palpations: Abdomen is soft.     Tenderness: There is no abdominal tenderness.  Musculoskeletal:        General: No deformity.     Cervical back: No tenderness.  Skin:    General: Skin is warm.     Findings: Bruising present.  Neurological:     Mental Status: She is alert. Mental status is at baseline.    ED Results / Procedures / Treatments   Labs (all labs ordered are listed, but only abnormal results are displayed) Labs Reviewed  CBC WITH DIFFERENTIAL/PLATELET  BASIC METABOLIC PANEL  URINALYSIS, ROUTINE W REFLEX MICROSCOPIC    EKG None  Radiology No results found.  Procedures Procedures   Medications Ordered in ED Medications - No data to display  ED Course  I have reviewed the triage vital signs and the nursing notes.  Pertinent labs & imaging results that were available during my care of the patient were reviewed by me and considered in my medical decision  making (see chart for details).    MDM Rules/Calculators/A&P                           87 year old female with pmhx of dementia, currently in baseline mental status, presented with reported falls and strong smelling urine.  Patient has some bruising of the chest but no other noted deformity or injury from these falls.  No noted syncope/seizures.  Blood work is reassuring, mild baseline AKI, CT of the head, face and neck showed no acute fracture.  We are pending urinalysis.  Patient signed out pending urinalysis and treatment if needed.  Final Clinical Impression(s) / ED Diagnoses Final diagnoses:  None    Rx / DC Orders ED Discharge Orders     None        Lorelle Gibbs, DO 11/30/20 1538

## 2020-11-30 NOTE — ED Notes (Addendum)
Pt ambulated in room with walker with no difficulty

## 2020-11-30 NOTE — ED Provider Notes (Signed)
  Physical Exam  BP (!) 162/94   Pulse (!) 55   Temp 97.8 F (36.6 C) (Oral)   Resp 19   LMP  (LMP Unknown)   SpO2 97%   Physical Exam  ED Course/Procedures     Procedures  MDM  Care assumed at 3 pm.  Patient is here after a fall.  Patient does have a history of recurrent UTIs.  Patient was hypertensive on arrival.  CT head and neck and face were unremarkable.  Signout pending labs and urinalysis  7:00 PM UA showed UTI.  Patient able to ambulate.  Patient's blood pressure down to the 160s now.  Patient is on losartan and metoprolol for blood pressure.  Told her to continue taking her blood pressure medicines and recheck with her doctor at home.  We will also discharge home with Keflex.       Drenda Freeze, MD 11/30/20 1900

## 2020-11-30 NOTE — Discharge Instructions (Addendum)
You have UTI. Take keflex three times daily for 5 days   Stay hydrated   Your blood pressure is elevated. Take your meds as prescribed   See your doctor in a week to recheck blood pressure   Return to ER if you have worse weakness, fever, vomiting

## 2020-11-30 NOTE — ED Triage Notes (Signed)
Pt with hx of dementia here for eval of fall x 2 since last night. Staff also reports strong smell of urine. Pt acting normal per staff-is only A/O x 1 at baseline.

## 2020-12-04 LAB — URINE CULTURE: Culture: >=100000 — AB

## 2020-12-05 ENCOUNTER — Telehealth: Payer: Self-pay

## 2020-12-05 NOTE — Telephone Encounter (Signed)
Post ED Visit - Positive Culture Follow-up: Unsuccessful Patient Follow-up  Culture assessed and recommendations reviewed by:  []  Elenor Quinones, Pharm.D. []  Heide Guile, Pharm.D., BCPS AQ-ID []  Parks Neptune, Pharm.D., BCPS []  Alycia Rossetti, Pharm.D., BCPS []  [place], [place].D., BCPS, AAHIVP []  Legrand Como, Pharm.D., BCPS, AAHIVP []  Wynell Balloon, PharmD []  Vincenza Hews, PharmD, BCPS  Positive urine culture  []  Patient discharged without antimicrobial prescription and treatment is now indicated [x]  Organism is resistant to prescribed ED discharge antimicrobial []  Patient with positive blood cultures   Unable to contact patient, letter will be sent to address on file  Glennon Hamilton 12/05/2020, 11:27 AM

## 2021-01-11 ENCOUNTER — Telehealth: Payer: Self-pay

## 2021-01-11 NOTE — Telephone Encounter (Signed)
Attempted to contact patient to schedule a Palliative Care consult appointment. No answer or voicemail.   

## 2021-01-16 ENCOUNTER — Telehealth: Payer: Self-pay

## 2021-01-16 NOTE — Telephone Encounter (Signed)
Attempted to contact patient to schedule a Palliative Care consult appointment. No answer and no voicemail.

## 2021-03-21 ENCOUNTER — Ambulatory Visit
Admission: RE | Admit: 2021-03-21 | Discharge: 2021-03-21 | Disposition: A | Discharge status: Home / Self Care | Payer: Medicare PPO | Source: Ambulatory Visit | Attending: Otolaryngology | Admitting: Otolaryngology

## 2021-03-21 DIAGNOSIS — E041 Nontoxic single thyroid nodule: Secondary | ICD-10-CM

## 2021-03-29 ENCOUNTER — Other Ambulatory Visit: Payer: Self-pay | Admitting: Otolaryngology

## 2021-03-29 DIAGNOSIS — E041 Nontoxic single thyroid nodule: Secondary | ICD-10-CM

## 2021-04-23 ENCOUNTER — Ambulatory Visit (HOSPITAL_BASED_OUTPATIENT_CLINIC_OR_DEPARTMENT_OTHER): Payer: Medicare PPO | Admitting: Cardiology

## 2021-05-06 ENCOUNTER — Emergency Department (HOSPITAL_COMMUNITY)
Admission: EM | Admit: 2021-05-06 | Discharge: 2021-05-06 | Disposition: A | Discharge status: Home / Self Care | Payer: Medicare PPO | Attending: Emergency Medicine | Admitting: Emergency Medicine

## 2021-05-06 ENCOUNTER — Other Ambulatory Visit: Payer: Self-pay

## 2021-05-06 ENCOUNTER — Emergency Department (HOSPITAL_COMMUNITY): Payer: Medicare PPO

## 2021-05-06 DIAGNOSIS — Z7901 Long term (current) use of anticoagulants: Secondary | ICD-10-CM | POA: Insufficient documentation

## 2021-05-06 DIAGNOSIS — Z794 Long term (current) use of insulin: Secondary | ICD-10-CM | POA: Diagnosis not present

## 2021-05-06 DIAGNOSIS — I10 Essential (primary) hypertension: Secondary | ICD-10-CM | POA: Diagnosis not present

## 2021-05-06 DIAGNOSIS — Z7984 Long term (current) use of oral hypoglycemic drugs: Secondary | ICD-10-CM | POA: Diagnosis not present

## 2021-05-06 DIAGNOSIS — E119 Type 2 diabetes mellitus without complications: Secondary | ICD-10-CM | POA: Diagnosis not present

## 2021-05-06 DIAGNOSIS — S0003XA Contusion of scalp, initial encounter: Secondary | ICD-10-CM | POA: Diagnosis not present

## 2021-05-06 DIAGNOSIS — W19XXXA Unspecified fall, initial encounter: Secondary | ICD-10-CM | POA: Insufficient documentation

## 2021-05-06 DIAGNOSIS — N3000 Acute cystitis without hematuria: Secondary | ICD-10-CM | POA: Insufficient documentation

## 2021-05-06 DIAGNOSIS — W228XXA Striking against or struck by other objects, initial encounter: Secondary | ICD-10-CM | POA: Diagnosis not present

## 2021-05-06 DIAGNOSIS — Z853 Personal history of malignant neoplasm of breast: Secondary | ICD-10-CM | POA: Diagnosis not present

## 2021-05-06 DIAGNOSIS — S0990XA Unspecified injury of head, initial encounter: Secondary | ICD-10-CM | POA: Diagnosis present

## 2021-05-06 LAB — BASIC METABOLIC PANEL
Anion gap: 8 (ref 5–15)
BUN: 19 mg/dL (ref 8–23)
CO2: 27 mmol/L (ref 22–32)
Calcium: 8.4 mg/dL — ABNORMAL LOW (ref 8.9–10.3)
Chloride: 104 mmol/L (ref 98–111)
Creatinine, Ser: 1.26 mg/dL — ABNORMAL HIGH (ref 0.44–1.00)
GFR, Estimated: 42 mL/min — ABNORMAL LOW (ref >60)
Glucose, Bld: 180 mg/dL — ABNORMAL HIGH (ref 70–99)
Potassium: 3.9 mmol/L (ref 3.5–5.1)
Sodium: 139 mmol/L (ref 135–145)

## 2021-05-06 LAB — CBC WITH DIFFERENTIAL/PLATELET
Abs Immature Granulocytes: 0.04 10*3/uL (ref 0.00–0.07)
Basophils Absolute: 0.1 10*3/uL (ref 0.0–0.1)
Basophils Relative: 1 %
Eosinophils Absolute: 0.2 10*3/uL (ref 0.0–0.5)
Eosinophils Relative: 2 %
HCT: 40 % (ref 36.0–46.0)
Hemoglobin: 12.7 g/dL (ref 12.0–15.0)
Immature Granulocytes: 1 %
Lymphocytes Relative: 8 %
Lymphs Abs: 0.6 10*3/uL — ABNORMAL LOW (ref 0.7–4.0)
MCH: 30.8 pg (ref 26.0–34.0)
MCHC: 31.8 g/dL (ref 30.0–36.0)
MCV: 97.1 fL (ref 80.0–100.0)
Monocytes Absolute: 0.5 10*3/uL (ref 0.1–1.0)
Monocytes Relative: 6 %
Neutro Abs: 6.5 10*3/uL (ref 1.7–7.7)
Neutrophils Relative %: 82 %
Platelets: 184 10*3/uL (ref 150–400)
RBC: 4.12 MIL/uL (ref 3.87–5.11)
RDW: 13.6 % (ref 11.5–15.5)
WBC: 7.9 10*3/uL (ref 4.0–10.5)
nRBC: 0 % (ref 0.0–0.2)

## 2021-05-06 LAB — URINALYSIS, ROUTINE W REFLEX MICROSCOPIC
Bilirubin Urine: NEGATIVE
Glucose, UA: NEGATIVE mg/dL
Ketones, ur: NEGATIVE mg/dL
Nitrite: POSITIVE — AB
Protein, ur: 100 mg/dL — AB
Specific Gravity, Urine: 1.02 (ref 1.005–1.030)
pH: 6 (ref 5.0–8.0)

## 2021-05-06 LAB — URINALYSIS, MICROSCOPIC (REFLEX)

## 2021-05-06 MED ORDER — CIPROFLOXACIN HCL 500 MG PO TABS: 500.0000 mg | ORAL_TABLET | Freq: Two times a day (BID) | ORAL | 0 refills | Status: DC

## 2021-05-06 MED ORDER — CEPHALEXIN 250 MG PO CAPS: 500.0000 mg | ORAL_CAPSULE | Freq: Once | ORAL | Status: DC

## 2021-05-06 MED ORDER — CEPHALEXIN 500 MG PO CAPS: 500.0000 mg | ORAL_CAPSULE | Freq: Four times a day (QID) | ORAL | 0 refills | Status: DC

## 2021-05-06 MED ORDER — CIPROFLOXACIN HCL 500 MG PO TABS
500.0000 mg | ORAL_TABLET | Freq: Once | ORAL | Status: AC
  Administered 2021-05-06: 500 mg via ORAL
  Filled 2021-05-06: qty 1

## 2021-05-06 NOTE — Discharge Instructions (Addendum)
Hold Eliquis dose today.

## 2021-05-06 NOTE — ED Notes (Signed)
Trauma Response Nurse Documentation   TATIANNA IBBOTSON is a 88 y.o. female arriving to [facility] ED via EMS  On Eliquis (apixaban) daily. Trauma was activated as a Level 2 by ED Charge RN based on the following trauma criteria Elderly patients > 65 with head trauma on anti-coagulation (excluding ASA). Trauma RN at the bedside on patient arrival. Patient cleared for CT by Dr. Gilford Raid. Patient to CT with team. GCS 15.  History   Past Medical History:  Diagnosis Date   Anemia    Anemia of decreased vitamin B12 absorption 05/26/2005   Arthritis    Atrial fibrillation ([place])    Breast cancer ([place]) 2001   Cataract    GERD (gastroesophageal reflux disease)    Hiatal hernia    History of tobacco abuse    Hx of colonic polyps    Hyperlipidemia    Hypertension    Hypothyroidism    Memory loss    Obesity    OSA on CPAP 09/2007   AHI 10.6/hr overall, 43.64/hr during REM, lost weight, does not use cpap   Osteopenia    Renal insufficiency    Type 2 diabetes mellitus [facility])      Past Surgical History:  Procedure Laterality Date   BACK SURGERY  05/30/2009   BIOPSY  01/01/2019   Procedure: BIOPSY;  Surgeon: Milus Banister, MD;  Location: [place];  Service: Endoscopy;;   BOWEL RESECTION  1974   CATARACT EXTRACTION Bilateral    bilateral   CHOLECYSTECTOMY  1974   COLONOSCOPY     ESOPHAGOGASTRODUODENOSCOPY N/A 01/01/2019   Procedure: ESOPHAGOGASTRODUODENOSCOPY (EGD);  Surgeon: Milus Banister, MD;  Location: [facility] ENDOSCOPY;  Service: Endoscopy;  Laterality: N/A;   EXTERNAL FIXATION REMOVAL Left 04/27/2020   Procedure: REMOVAL EXTERNAL FIXATION ARM;  Surgeon: Shona Needles, MD;  Location: [place];  Service: Orthopedics;  Laterality: Left;   MASTECTOMY Bilateral 2001   bilateral sentinel lymph nodes bio   NM MYOCAR PERF WALL MOTION  06/2007   dipyridamole; perfusion defect in inferior myocardium consistent with diaphragmatic attenuation, remaining myocardium with no ischemia/infarct,  EF 73%; normal, low risk scan    ORIF ELBOW FRACTURE Left 03/21/2020   Procedure: OPEN REDUCTION INTERNAL FIXATION (ORIF) ELBOW/OLECRANON FRACTURE;  Surgeon: Shona Needles, MD;  Location: [place];  Service: Orthopedics;  Laterality: Left;   TOTAL ABDOMINAL HYSTERECTOMY  1975   TRANSTHORACIC ECHOCARDIOGRAM  02/2010   NG=>29%, stage 1 diastolic dysfunction; borderline RV enlargement; LA mild-mod dilated; mild mitral annular calcif & mild MR; mild TR with normal RSVP, AV moderately sclerotics       Initial Focused Assessment (If applicable, or please see trauma documentation): - GCS 15 - Hematoma to back of head - pain to back of head - no LOC - BP slightly elevated 168 / palp with EMS - PERRLA  CT's Completed:   CT Head and CT C-Spine   Interventions:  - 20G PIV to R AC - basic labs - CXR / pelvic XR - CT head and neck - Cleaned pt with NT (soaked in urine)  Plan for disposition:  Other: unsure at this time - awaiting scan results  Consults completed:  none at 0930.  Event Summary: Pt was BIB [facility] as a L2 trauma fall on thinners.  Pt is from [facility] living.  Pt was attempting to go to the bathroom without her walker and was just using the walls and furniture to guide herself when she fell striking the back  of her head on the dresser.  She sustained a hematoma to the back of her head.  No LOC, no dizziness.   MTP Summary (If applicable): n/a  Bedside handoff with ED RN Tori.    Clovis Cao  Trauma Response RN  Please call TRN at [phone] for further assistance.

## 2021-05-06 NOTE — ED Provider Notes (Addendum)
[facility] EMERGENCY DEPARTMENT Provider Note   CSN: [id number] Arrival date & time: 05/06/21  0827     History  Chief Complaint  Patient presents with   Diana [name]    Diana [name] is a 88 y.o. female.  Pt is a 88 yo wf with a hx of htn, hyperlipidemia, breast cancer, gerd, anemia, type 2 dm, afib on Eliquis.  She is supposed to use a walker and fell going to the bathroom this morning.  She hit the back of her head on a dresser.  She denies any pain.  She denies loc.  She is awake and alert now.  Pt was able to ambulate for EMS.      Home Medications Prior to Admission medications   Medication Sig Start Date End Date Taking? Authorizing Provider  ciprofloxacin (CIPRO) 500 MG tablet Take 1 tablet (500 mg total) by mouth 2 (two) times daily. 05/06/21  Yes Isla Pence, MD  brimonidine-timolol (COMBIGAN) 0.2-0.5 % ophthalmic solution Place 1 drop into both eyes every 12 (twelve) hours. 05/26/20   Medina-Vargas, Monina C, NP  FLUoxetine (PROZAC) 20 MG capsule Take 1 capsule (20 mg total) by mouth daily. 05/26/20   Medina-Vargas, Monina C, NP  insulin glargine (LANTUS) 100 UNIT/ML Solostar Pen Inject 7 Units into the skin at bedtime. 07/06/20   Caren Griffins, MD  levothyroxine (SYNTHROID) 50 MCG tablet Take 1 tablet (50 mcg total) by mouth daily. 05/26/20   Medina-Vargas, Monina C, NP  losartan (COZAAR) 50 MG tablet Take 1 tablet (50 mg total) by mouth 2 (two) times daily. 05/26/20   Medina-Vargas, Monina C, NP  metFORMIN (GLUCOPHAGE) 500 MG tablet Take 500 mg by mouth daily with breakfast.    [provider]  metFORMIN (GLUCOPHAGE) 500 MG tablet Take 2 tablets (1,000 mg total) by mouth daily with breakfast. 08/30/20   Charlynne Cousins, MD  metoprolol tartrate (LOPRESSOR) 25 MG tablet Take 0.5 tablets (12.5 mg total) by mouth 2 (two) times daily. 05/26/20   Medina-Vargas, Monina C, NP  Multiple Vitamins-Minerals (PRESERVISION AREDS 2 PO) Take 1 capsule by  mouth every morning.    [provider]  pantoprazole (PROTONIX) 40 MG tablet Take 1 tablet (40 mg total) by mouth 2 (two) times daily before a meal. 05/26/20   Medina-Vargas, Monina C, NP  rosuvastatin (CRESTOR) 10 MG tablet Take 10 mg by mouth at bedtime.    [provider]      Allergies    Codeine sulfate, Sps [sodium polystyrene sulfonate], and Sulfa antibiotics    Review of Systems   Review of Systems  Neurological:  Positive for headaches.  All other systems reviewed and are negative.  Physical Exam Updated Vital Signs BP (!) 185/96    Pulse 73    Temp 97.6 F (36.4 C) (Oral)    Resp 20    LMP  (LMP Unknown)    SpO2 98%  Physical Exam Vitals and nursing note reviewed.  Constitutional:      Appearance: Normal appearance.  HENT:     Head: Normocephalic.      Right Ear: External ear normal.     Left Ear: External ear normal.     Mouth/Throat:     Mouth: Mucous membranes are moist.     Pharynx: Oropharynx is clear.  Eyes:     Extraocular Movements: Extraocular movements intact.     Conjunctiva/sclera: Conjunctivae normal.     Pupils: Pupils are equal, round, and reactive to light.  Cardiovascular:     Rate and Rhythm: Normal rate and regular rhythm.     Pulses: Normal pulses.     Heart sounds: Normal heart sounds.  Pulmonary:     Effort: Pulmonary effort is normal.     Breath sounds: Normal breath sounds.  Abdominal:     General: Abdomen is flat. Bowel sounds are normal.     Palpations: Abdomen is soft.  Musculoskeletal:        General: Normal range of motion.     Cervical back: Normal range of motion and neck supple.  Skin:    General: Skin is warm.     Capillary Refill: Capillary refill takes less than 2 seconds.  Neurological:     General: No focal deficit present.     Mental Status: She is alert and oriented to person, place, and time.  Psychiatric:        Mood and Affect: Mood normal.        Behavior: Behavior normal.    ED Results /  Procedures / Treatments   Labs (all labs ordered are listed, but only abnormal results are displayed) Labs Reviewed  BASIC METABOLIC PANEL - Abnormal; Notable for the following components:      Result Value   Glucose, Bld 180 (*)    Creatinine, Ser 1.26 (*)    Calcium 8.4 (*)    GFR, Estimated 42 (*)    All other components within normal limits  CBC WITH DIFFERENTIAL/PLATELET - Abnormal; Notable for the following components:   Lymphs Abs 0.6 (*)    All other components within normal limits  URINALYSIS, ROUTINE W REFLEX MICROSCOPIC - Abnormal; Notable for the following components:   APPearance CLOUDY (*)    Hgb urine dipstick TRACE (*)    Protein, ur 100 (*)    Nitrite POSITIVE (*)    Leukocytes,Ua TRACE (*)    All other components within normal limits  URINALYSIS, MICROSCOPIC (REFLEX) - Abnormal; Notable for the following components:   Bacteria, UA MANY (*)    All other components within normal limits  URINE CULTURE    EKG None  Radiology CT Head Wo Contrast  Result Date: 05/06/2021 CLINICAL DATA:  Provided history: Head trauma, minor. Neck trauma. Additional history provided: Fall (hitting back of head). EXAM: CT HEAD WITHOUT CONTRAST CT CERVICAL SPINE WITHOUT CONTRAST TECHNIQUE: Multidetector CT imaging of the head and cervical spine was performed following the standard protocol without intravenous contrast. Multiplanar CT image reconstructions of the cervical spine were also generated. RADIATION DOSE REDUCTION: This exam was performed according to the departmental dose-optimization program which includes automated exposure control, adjustment of the mA and/or kV according to patient size and/or use of iterative reconstruction technique. COMPARISON:  Prior head CT examinations 11/30/2020 and earlier. Prior cervical spine CT examinations 11/30/2020 and earlier. FINDINGS: CT HEAD FINDINGS Brain: Moderate cerebral atrophy without appreciable lobar predominance. Comparatively mild  cerebellar atrophy. Moderate patchy and ill-defined hypoattenuation within the cerebral white matter, nonspecific but compatible with chronic small vessel ischemic disease. 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 There is no acute intracranial hemorrhage. No demarcated cortical infarct. No extra-axial fluid collection. No evidence of an intracranial mass. No midline shift. Vascular: No hyperdense vessel.  Atherosclerotic calcifications. Skull: Normal. Negative for fracture or focal lesion. Sinuses/Orbits: Visualized orbits show no acute finding. Mild mucosal thickening within the bilateral ethmoid sinuses. Small fluid level within the left sphenoid sinus. Small fluid level, frothy secretions and background mild mucosal thickening within  the left maxillary sinus. Other: Right mastoid effusion.  Left occipital scalp hematoma. CT CERVICAL SPINE FINDINGS Mildly motion degraded exam. Alignment: No significant spondylolisthesis. Skull base and vertebrae: Trace C5-C6 grade 1 retrolisthesis. Soft tissues and spinal canal: The basion-dental and atlanto-dental intervals are maintained.No evidence of acute fracture to the cervical spine. Disc levels: Cervical spondylosis with multilevel disc space narrowing, disc bulges/central disc protrusions and uncovertebral hypertrophy. Disc space narrowing is greatest at C5-C6 and C6-C7 (moderate to moderately advanced at these levels). No appreciable high-grade spinal canal stenosis. Multilevel bony neural foraminal narrowing. Upper chest: No consolidation within the imaged lung apices. No visible pneumothorax. Other: Redemonstrated 2 cm peripherally calcified nodule within the inferior right thyroid lobe. The thyroid was previously evaluated by ultrasound on 03/21/2021, and by report, this nodule was previously biopsied. IMPRESSION: CT head: 1. No evidence of acute intracranial abnormality. 2. Left occipital scalp hematoma. 3. Moderate chronic small  vessel ischemic changes within the cerebral white matter. 4. 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 5. Moderate generalized cerebral atrophy with comparatively mild cerebellar atrophy. 6. Paranasal sinus disease, as described. Correlate for acute sinusitis. 7. Right mastoid effusion. CT cervical spine: 1. Mildly motion degraded exam. 2. No evidence of acute fracture to the cervical spine. 3. Trace C5-C6 grade 1 retrolisthesis, unchanged. 4. Cervical spondylosis, as outline. Electronically Signed   By: Kellie Simmering D.O.   On: 05/06/2021 09:29   CT Cervical Spine Wo Contrast  Result Date: 05/06/2021 CLINICAL DATA:  Provided history: Head trauma, minor. Neck trauma. Additional history provided: Fall (hitting back of head). EXAM: CT HEAD WITHOUT CONTRAST CT CERVICAL SPINE WITHOUT CONTRAST TECHNIQUE: Multidetector CT imaging of the head and cervical spine was performed following the standard protocol without intravenous contrast. Multiplanar CT image reconstructions of the cervical spine were also generated. RADIATION DOSE REDUCTION: This exam was performed according to the departmental dose-optimization program which includes automated exposure control, adjustment of the mA and/or kV according to patient size and/or use of iterative reconstruction technique. COMPARISON:  Prior head CT examinations 11/30/2020 and earlier. Prior cervical spine CT examinations 11/30/2020 and earlier. FINDINGS: CT HEAD FINDINGS Brain: Moderate cerebral atrophy without appreciable lobar predominance. Comparatively mild cerebellar atrophy. Moderate patchy and ill-defined hypoattenuation within the cerebral white matter, nonspecific but compatible with chronic small vessel ischemic disease. 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 There is no acute intracranial hemorrhage. No demarcated cortical infarct. No extra-axial fluid collection. No evidence of an intracranial  mass. No midline shift. Vascular: No hyperdense vessel.  Atherosclerotic calcifications. Skull: Normal. Negative for fracture or focal lesion. Sinuses/Orbits: Visualized orbits show no acute finding. Mild mucosal thickening within the bilateral ethmoid sinuses. Small fluid level within the left sphenoid sinus. Small fluid level, frothy secretions and background mild mucosal thickening within the left maxillary sinus. Other: Right mastoid effusion.  Left occipital scalp hematoma. CT CERVICAL SPINE FINDINGS Mildly motion degraded exam. Alignment: No significant spondylolisthesis. Skull base and vertebrae: Trace C5-C6 grade 1 retrolisthesis. Soft tissues and spinal canal: The basion-dental and atlanto-dental intervals are maintained.No evidence of acute fracture to the cervical spine. Disc levels: Cervical spondylosis with multilevel disc space narrowing, disc bulges/central disc protrusions and uncovertebral hypertrophy. Disc space narrowing is greatest at C5-C6 and C6-C7 (moderate to moderately advanced at these levels). No appreciable high-grade spinal canal stenosis. Multilevel bony neural foraminal narrowing. Upper chest: No consolidation within the imaged lung apices. No visible pneumothorax. Other: Redemonstrated 2 cm peripherally calcified nodule within the  inferior right thyroid lobe. The thyroid was previously evaluated by ultrasound on 03/21/2021, and by report, this nodule was previously biopsied. IMPRESSION: CT head: 1. No evidence of acute intracranial abnormality. 2. Left occipital scalp hematoma. 3. Moderate chronic small vessel ischemic changes within the cerebral white matter. 4. 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 5. Moderate generalized cerebral atrophy with comparatively mild cerebellar atrophy. 6. Paranasal sinus disease, as described. Correlate for acute sinusitis. 7. Right mastoid effusion. CT cervical spine: 1. Mildly motion degraded exam. 2. No evidence  of acute fracture to the cervical spine. 3. Trace C5-C6 grade 1 retrolisthesis, unchanged. 4. Cervical spondylosis, as outline. Electronically Signed   By: Kellie Simmering D.O.   On: 05/06/2021 09:29   DG Pelvis Portable  Result Date: 05/06/2021 CLINICAL DATA:  fall EXAM: PORTABLE PELVIS 1-2 VIEWS COMPARISON:  None. FINDINGS: Normal alignment. No acute fracture. Osteopenia. The soft tissues are unremarkable. IMPRESSION: No malalignment or acute fracture.  Osteopenia. Electronically Signed   By: Albin Felling M.D.   On: 05/06/2021 09:02   DG Chest Portable 1 View  Result Date: 05/06/2021 CLINICAL DATA:  fall EXAM: PORTABLE CHEST 1 VIEW COMPARISON:  April 2022 FINDINGS: Similar cardiomegaly. Calcifications of the aortic arch. Stable appearance of peripherally calcified structure overlying the right lower neck, appears to be calcified thyroid nodule. No pleural effusion. No pneumothorax. No mass or consolidation. No acute osseous abnormality. Osteopenia. IMPRESSION: No acute findings in the chest.  Other chronic findings as described Electronically Signed   By: Albin Felling M.D.   On: 05/06/2021 08:59    Procedures Procedures    Medications Ordered in ED Medications  ciprofloxacin (CIPRO) tablet 500 mg (has no administration in time range)    ED Course/ Medical Decision Making/ A&P                           Medical Decision Making Amount and/or Complexity of Data Reviewed Labs: ordered. Radiology: ordered.  Risk Prescription drug management.   This patient presents to the ED for concern of fall, this involves an extensive number of treatment options, and is a complaint that carries with it a high risk of complications and morbidity.  The differential diagnosis includes head injury, fracture   Co morbidities that complicate the patient evaluation  Eliquis use   Additional history obtained:  Additional history obtained from epic chart review    Lab Tests:  I Ordered, and  personally interpreted labs.  The pertinent results include:  cbc nl, bmp with glucose at 180 and cr of 1.26.  GFR of 42.  This is chronic   Imaging Studies ordered:  I ordered imaging studies including cxr, pelvis, ct head and c-spine  I independently visualized and interpreted imaging which showed nothing acute I agree with the radiologist interpretation   Cardiac Monitoring:  The patient was maintained on a cardiac monitor.  I personally viewed and interpreted the cardiac monitored which showed an underlying rhythm of: nsr   Medicines ordered and prescription drug management:  Pt did not want anything for pain. I have reviewed the patients home medicines and have made adjustments as needed  Problem List / ED Course:  Cephalohematoma:  CT neg.   Fall:  Pt ambulated in ED with her walker without problems.  However, she is very  unsteady on her feet without her walker.  It is clear how she fell when she was not using it. UTI:  Initially, she was going to be put on keflex, however, the nurse spoke with the caregiver who said keflex did not work.  So, I will send for a culture and put her on cipro.   Reevaluation:  After the interventions noted above, I reevaluated the patient and found that they have :improved   Social Determinants of Health:  Lives at assisted living.  She did not want me to call anyone to let them know what was going on.   Dispostion:  After consideration of the diagnostic results and the patients response to treatment, I feel that the patent would benefit from discharge with outpatient f/u.       Final Clinical Impression(s) / ED Diagnoses Final diagnoses:  Fall, initial encounter  Contusion of scalp, initial encounter  On apixaban therapy  Acute cystitis without hematuria    Rx / DC Orders ED Discharge Orders          Ordered    cephALEXin (KEFLEX) 500 MG capsule  4 times daily,   Status:  Discontinued        05/06/21 1157    ciprofloxacin  (CIPRO) 500 MG tablet  2 times daily        05/06/21 1211              Isla Pence, MD 05/06/21 1158    Isla Pence, MD 05/06/21 1211

## 2021-05-06 NOTE — ED Notes (Signed)
RN updated sheila in chart, caregiver.

## 2021-05-06 NOTE — ED Triage Notes (Signed)
Pt BIB by EMS due to a level 2 trauma fall on thinners. Pt lives at [facility] and was getting up to 8use to the bathroom and fell and hit her head on dresser. Pt is on elliquis. Pt denies LOC. Pt has memory issues at baseline. Pt is axox4. Pt has hematoma to back of head.

## 2021-05-06 NOTE — ED Notes (Signed)
Pt did well walking with a walker, MD notified.

## 2021-05-06 NOTE — Progress Notes (Signed)
Orthopedic Tech Progress Note Patient Details:  Diana [name] May 04, 1933 [id number]  Level 2 trauma   Patient ID: [name], female   DOB: 1934-01-29, 88 y.o.   MRN: [id number]  [name] 05/06/2021, 9:22 AM

## 2021-05-08 LAB — URINE CULTURE: Culture: >=100000 — AB

## 2021-05-09 ENCOUNTER — Telehealth: Payer: Self-pay | Admitting: *Deleted

## 2021-05-09 NOTE — Telephone Encounter (Signed)
Pt caregiver [name]) called regarding diagnostic testing and Rx listed on After Visit Summary (AVS). [name] clarified terminology of pt portable chest x-ray. [name] also reviewed pt current medications and requested update of pt records for future visits.     ? ?

## 2021-05-09 NOTE — Telephone Encounter (Signed)
Post ED Visit - Positive Culture Follow-up ? ?Culture report reviewed by antimicrobial stewardship pharmacist: ?[facility] Team ?[]  Elenor Quinones, Pharm.D. ?[]  Heide Guile, Pharm.D., BCPS AQ-ID ?[]  Parks Neptune, Pharm.D., BCPS ?[]  Alycia Rossetti, Pharm.D., BCPS ?[]  [place], Pharm.D., BCPS, AAHIVP ?[]  Legrand Como, Pharm.D., BCPS, AAHIVP ?[]  Salome Arnt, PharmD, BCPS ?[]  Johnnette Gourd, PharmD, BCPS ?[]  Hughes Better, PharmD, BCPS ?[]  Leeroy Cha, PharmD ?[]  Laqueta Linden, PharmD, BCPS ?[x]  Albertina Parr, PharmD ? ?[facility] Team ?[]  Leodis Sias, PharmD ?[]  Lindell Spar, PharmD ?[]  Royetta Asal, PharmD ?[]  Graylin Shiver, Rph ?[]  Rema Fendt) Glennon Mac, PharmD ?[]  Arlyn Dunning, PharmD ?[]  Netta Cedars, PharmD ?[]  Dia Sitter, PharmD ?[]  Leone Haven, PharmD ?[]  Gretta Arab, PharmD ?[]  Theodis Shove, PharmD ?[]  Peggyann Juba, PharmD ?[]  Reuel Boom, PharmD ? ? ?Positive urine culture ?Treated with Cephalexin, organism sensitive to the same and no further patient follow-up is required at this time. ? ?Ardeen Fillers ?05/09/2021, 9:50 AM ?  ?

## 2021-05-14 ENCOUNTER — Ambulatory Visit (HOSPITAL_BASED_OUTPATIENT_CLINIC_OR_DEPARTMENT_OTHER): Payer: Medicare PPO | Admitting: Cardiology

## 2021-05-14 ENCOUNTER — Encounter (HOSPITAL_BASED_OUTPATIENT_CLINIC_OR_DEPARTMENT_OTHER): Payer: Self-pay | Admitting: Cardiology

## 2021-05-14 ENCOUNTER — Other Ambulatory Visit: Payer: Self-pay

## 2021-05-14 VITALS — BP 128/58 | HR 65 | Ht 66.0 in | Wt 194.5 lb

## 2021-05-14 DIAGNOSIS — D6869 Other thrombophilia: Secondary | ICD-10-CM | POA: Diagnosis not present

## 2021-05-14 DIAGNOSIS — I10 Essential (primary) hypertension: Secondary | ICD-10-CM

## 2021-05-14 DIAGNOSIS — I4821 Permanent atrial fibrillation: Secondary | ICD-10-CM | POA: Insufficient documentation

## 2021-05-14 DIAGNOSIS — E785 Hyperlipidemia, unspecified: Secondary | ICD-10-CM | POA: Diagnosis not present

## 2021-05-14 DIAGNOSIS — E119 Type 2 diabetes mellitus without complications: Secondary | ICD-10-CM

## 2021-05-14 NOTE — Progress Notes (Signed)
Cardiology Office Note:    Date:  05/14/2021   ID:  Diana [name], DOB January 04, 1934, MRN [id number]  PCP:  Christain Sacramento, MD  Cardiologist:  Buford Dresser, MD  Referring MD: Christain Sacramento, MD   CC: follow up  History of Present Illness:    Diana [name] is a 88 y.o. female with a hx of hypertension, OSA on CPAP, type II diabetes, hyperlipidemia, atrial fibrillation who is seen for follow-up. She was initially seen 03/16/20 as a new consult at the request of Christain Sacramento, MD for the evaluation and management of atrial fibrillation. She was seen in the office by Dr. Claiborne Billings in 11/2016.  Today: She is accompanied by her caretaker who acts as her historian. Her caretaker reports she has been falling with no one available. She is often found on the floor. The falls occur sporadically. Her last fall occurred 1 week ago and she was sent to the ER because she hit the back of her head. She typically walks with a walker or with assistance and does well. She did have a small fracture on her L ankle that was able to heal quickly. She was taken off of Eliquis due to the frequent falls. Getting up and walking around causes her to feel dizzy and winded.   She does not feel her Afib episodes  Her caretaker reports she will occasionally have loose stool.   Denies chest pain. No PND, orthopnea, or unexpected weight gain. No syncope.  Past Medical History:  Diagnosis Date   Anemia    Anemia of decreased vitamin B12 absorption 05/26/2005   Arthritis    Atrial fibrillation ([place])    Breast cancer ([place]) 2001   Cataract    GERD (gastroesophageal reflux disease)    Hiatal hernia    History of tobacco abuse    Hx of colonic polyps    Hyperlipidemia    Hypertension    Hypothyroidism    Memory loss    Obesity    OSA on CPAP 09/2007   AHI 10.6/hr overall, 43.64/hr during REM, lost weight, does not use cpap   Osteopenia    Renal insufficiency    Type 2 diabetes mellitus [facility])      Past Surgical History:  Procedure Laterality Date   BACK SURGERY  05/30/2009   BIOPSY  01/01/2019   Procedure: BIOPSY;  Surgeon: Milus Banister, MD;  Location: [place];  Service: Endoscopy;;   BOWEL RESECTION  1974   CATARACT EXTRACTION Bilateral    bilateral   CHOLECYSTECTOMY  1974   COLONOSCOPY     ESOPHAGOGASTRODUODENOSCOPY N/A 01/01/2019   Procedure: ESOPHAGOGASTRODUODENOSCOPY (EGD);  Surgeon: Milus Banister, MD;  Location: [facility] ENDOSCOPY;  Service: Endoscopy;  Laterality: N/A;   EXTERNAL FIXATION REMOVAL Left 04/27/2020   Procedure: REMOVAL EXTERNAL FIXATION ARM;  Surgeon: Shona Needles, MD;  Location: [place];  Service: Orthopedics;  Laterality: Left;   MASTECTOMY Bilateral 2001   bilateral sentinel lymph nodes bio   NM MYOCAR PERF WALL MOTION  06/2007   dipyridamole; perfusion defect in inferior myocardium consistent with diaphragmatic attenuation, remaining myocardium with no ischemia/infarct, EF 73%; normal, low risk scan    ORIF ELBOW FRACTURE Left 03/21/2020   Procedure: OPEN REDUCTION INTERNAL FIXATION (ORIF) ELBOW/OLECRANON FRACTURE;  Surgeon: Shona Needles, MD;  Location: [place];  Service: Orthopedics;  Laterality: Left;   TOTAL ABDOMINAL HYSTERECTOMY  1975   TRANSTHORACIC ECHOCARDIOGRAM  02/2010   OF=>75%, stage 1 diastolic dysfunction; borderline  RV enlargement; LA mild-mod dilated; mild mitral annular calcif & mild MR; mild TR with normal RSVP, AV moderately sclerotics    Current Medications: Current Outpatient Medications on File Prior to Visit  Medication Sig   amLODipine (NORVASC) 5 MG tablet Take 5 mg by mouth daily.   brimonidine-timolol (COMBIGAN) 0.2-0.5 % ophthalmic solution Place 1 drop into both eyes every 12 (twelve) hours.   ferrous sulfate 325 (65 FE) MG EC tablet Take 1 tablet by mouth daily.   FLUoxetine (PROZAC) 20 MG capsule Take 1 capsule (20 mg total) by mouth daily.   insulin glargine (LANTUS) 100 UNIT/ML Solostar Pen Inject 7 Units into  the skin at bedtime. (Patient taking differently: Inject 10 Units into the skin at bedtime.)   levothyroxine (SYNTHROID) 50 MCG tablet Take 1 tablet (50 mcg total) by mouth daily.   losartan (COZAAR) 50 MG tablet Take 1 tablet (50 mg total) by mouth 2 (two) times daily.   metFORMIN (GLUCOPHAGE) 500 MG tablet Take 500 mg by mouth 2 (two) times daily with a meal.   metoprolol tartrate (LOPRESSOR) 25 MG tablet Take 0.5 tablets (12.5 mg total) by mouth 2 (two) times daily.   Multiple Vitamins-Minerals (PRESERVISION AREDS 2 PO) Take 1 capsule by mouth every morning.   pantoprazole (PROTONIX) 40 MG tablet Take 1 tablet (40 mg total) by mouth 2 (two) times daily before a meal.   rosuvastatin (CRESTOR) 10 MG tablet Take 10 mg by mouth at bedtime.   No current facility-administered medications on file prior to visit.     Allergies:   Codeine sulfate, Sps [sodium polystyrene sulfonate], and Sulfa antibiotics   Social History   Tobacco Use   Smoking status: Former    Years: 13.00    Types: Cigarettes    Quit date: 05/02/2002    Years since quitting: 19.0   Smokeless tobacco: Never  Vaping Use   Vaping Use: Never used  Substance Use Topics   Alcohol use: No    Alcohol/week: 0.0 standard drinks   Drug use: Never    Family History: family history includes Heart attack in her father and mother. There is no history of Colon cancer.  ROS:   Please see the history of present illness.  Additional pertinent ROS: (+) Falls (+) Dizziness/lightheadedness (+) Shortness of breath (+) Palpitations (+) Loose stool  EKGs/Labs/Other Studies Reviewed:    The following studies were reviewed today: Echo 08/26/20  1. Left ventricular ejection fraction, by estimation, is 70 to 75%. The  left ventricle has hyperdynamic function. The left ventricle has no  regional wall motion abnormalities. There is mild left ventricular  hypertrophy. Left ventricular diastolic  function could not be evaluated.   2.  Right ventricular systolic function is mildly reduced. The right  ventricular size is normal. There is mildly elevated pulmonary artery  systolic pressure.   3. Left atrial size was severely dilated.   4. Right atrial size was mildly dilated.   5. The mitral valve is abnormal. Trivial mitral valve regurgitation.   6. The aortic valve is tricuspid. Mild sclerosis. Aortic valve  regurgitation is not visualized. Aortic valve mean gradient measures 8.0  mmHg, DI is 0.50   7. The inferior vena cava is normal in size with greater than 50%  respiratory variability, suggesting right atrial pressure of 3 mmHg.   Comparison(s): Changes from prior study are noted. 12/28/2018: LVEF  60-65%, no aortic stenosis.  Carotid US 01/01/19 Summary:  Right Carotid: Velocities in the right ICA  are consistent with a 1-39%  stenosis.   Left Carotid: Velocities in the left ICA are consistent with a 1-39%  stenosis.   Vertebrals: Bilateral vertebral arteries demonstrate antegrade flow.  Echo 12/31/18 1. Left ventricular ejection fraction, by visual estimation, is 60 to  65%. The left ventricle has normal function. Normal left ventricular size.  There is no left ventricular hypertrophy.   2. Left ventricular diastolic function could not be evaluated pattern of  LV diastolic filling.   3. Global right ventricle has normal systolic function.The right  ventricular size is not well visualized. No increase in right ventricular  wall thickness.   4. Left atrial size was normal.   5. Right atrial size was not well visualized.   6. Moderate mitral annular calcification.   7. The mitral valve was not well visualized. Trace mitral valve  regurgitation. No evidence of mitral stenosis.   8. The tricuspid valve is normal in structure. Tricuspid valve  regurgitation was not visualized by color flow Doppler.   9. The aortic valve is normal in structure. Aortic valve regurgitation  was not visualized by color flow  Doppler. Structurally normal aortic  valve, with no evidence of sclerosis or stenosis.  10. The pulmonic valve was not well visualized. Pulmonic valve  regurgitation is not visualized by color flow Doppler.  11. TR signal is inadequate for assessing pulmonary artery systolic  pressure.   EKG:  EKG is personally reviewed.   05/14/21: atrial fibrillation at 65 bpm 03/16/20: atrial fibrillation at 75 bpm  Recent Labs: 07/02/2020: Magnesium 1.7 08/25/2020: ALT 16 05/06/2021: BUN 19; Creatinine, Ser 1.26; Hemoglobin 12.7; Platelets 184; Potassium 3.9; Sodium 139  Recent Lipid Panel    Component Value Date/Time   CHOL 85 04/02/2020 0000   TRIG 58 04/02/2020 0000   HDL 35 04/02/2020 0000   CHOLHDL 2.7 07/05/2009 1005   VLDL 22 07/05/2009 1005   LDLCALC 38 04/02/2020 0000    Physical Exam:    VS:  BP (!) 128/58 (BP Location: Right Arm, Patient Position: Sitting, Cuff Size: Large)    Pulse 65    Ht '5\' 6"'$  (1.676 m)    Wt 194 lb 8 oz (88.2 kg)    LMP  (LMP Unknown)    BMI 31.39 kg/m     Wt Readings from Last 3 Encounters:  05/14/21 194 lb 8 oz (88.2 kg)  08/29/20 197 lb 1.5 oz (89.4 kg)  07/01/20 189 lb 13.1 oz (86.1 kg)    GEN: Well nourished, well developed in no acute distress HEENT: Normal, moist mucous membranes NECK: No JVD CARDIAC: irregularly irregular rhythm, normal S1 and S2, no rubs or gallops. 2/6 systolic murmur. VASCULAR: Radial pulses 2+ bilaterally. RESPIRATORY:  Clear to auscultation without rales, wheezing or rhonchi  ABDOMEN: Soft, non-tender, non-distended MUSCULOSKELETAL: In wheelchair SKIN: Warm and dry, no LE edema NEUROLOGIC:  Alert and oriented x 3. No focal neuro deficits noted. PSYCHIATRIC:  Normal affect    ASSESSMENT:    1. Permanent atrial fibrillation ([place])   2. Primary hypertension   3. Hyperlipidemia, unspecified hyperlipidemia type   4. Secondary hypercoagulable state ([place])   5. Type 2 diabetes mellitus without complication, without long-term  current use of insulin (HCC)     PLAN:    Atrial fibrillation, permanent -she is completely asymptomatic -aim for rate control with metoprolol -normal EF on echo -was on apixaban. Unfortunately has had multiple unwitnessed falls, including some that have resulted in head injury. They have discussed options  and elected to stop anticoagulation. Very difficult situation. High risk of stroke given score, below, but high risk of life threatening injury/bleed with fall -CHA2DS2/VAS Stroke Risk Points= 6   Hypertension: -on losartan, metoprolol, amlodipine -well controlled -follows with Dr. Redmond Pulling  Dyslipidemia Type II diabetes -last A1c 8.4 per [facility] -on rosuvastatin -was not on aspirin due to anticoagulation, with high fall risk will not add aspirin at this time -on metformin  Plan for follow up: 1 year or sooner PRN  Buford Dresser, MD, PhD, [facility]    Medication Adjustments/Labs and Tests Ordered: Current medicines are reviewed at length with the patient today.  Concerns regarding medicines are outlined above.  Orders Placed This Encounter  Procedures   EKG 12-Lead   No orders of the defined types were placed in this encounter.   Patient Instructions  Medication Instructions:  Your Physician recommend you continue on your current medication as directed.    *If you need a refill on your cardiac medications before your next appointment, please call your pharmacy*   Lab Work: None ordered today   Testing/Procedures: None ordered today   Follow-Up: At [facility], you and your health needs are our priority.  As part of our continuing mission to provide you with exceptional heart care, we have created designated Provider Care Teams.  These Care Teams include your primary Cardiologist (physician) and Advanced Practice Providers (APPs -  Physician Assistants and Nurse Practitioners) who all work together to provide you with the care you  need, when you need it.  We recommend signing up for the patient portal called "MyChart".  Sign up information is provided on this After Visit Summary.  MyChart is used to connect with patients for Virtual Visits (Telemedicine).  Patients are able to view lab/test results, encounter notes, upcoming appointments, etc.  Non-urgent messages can be sent to your provider as well.   To learn more about what you can do with MyChart, go to [email or web address].    Your next appointment:   1 year(s)  The format for your next appointment:   In Person  Provider:   Buford Dresser, MD        Wilhemina Bonito as a scribe for Buford Dresser, MD.,have documented all relevant documentation on the behalf of Buford Dresser, MD,as directed by  Buford Dresser, MD while in the presence of Buford Dresser, MD.  I, Buford Dresser, MD, have reviewed all documentation for this visit. The documentation on 05/14/21 for the exam, diagnosis, procedures, and orders are all accurate and complete.   Signed, Buford Dresser, MD PhD 05/14/2021 12:49 PM    [place]

## 2021-05-14 NOTE — Patient Instructions (Signed)

## 2021-07-03 ENCOUNTER — Other Ambulatory Visit: Payer: Self-pay | Admitting: Family Medicine

## 2021-07-03 ENCOUNTER — Other Ambulatory Visit: Payer: Medicare PPO

## 2021-07-03 DIAGNOSIS — R2241 Localized swelling, mass and lump, right lower limb: Secondary | ICD-10-CM

## 2021-08-15 ENCOUNTER — Other Ambulatory Visit (HOSPITAL_COMMUNITY): Payer: Self-pay | Admitting: Surgery

## 2021-08-15 ENCOUNTER — Other Ambulatory Visit: Payer: Self-pay | Admitting: Surgery

## 2021-08-15 DIAGNOSIS — D171 Benign lipomatous neoplasm of skin and subcutaneous tissue of trunk: Secondary | ICD-10-CM

## 2021-08-20 NOTE — Progress Notes (Unsigned)
Arne Cleveland, MD  Riley Lam Ok   US aspiration R groin complex cyst send for cytology  Consider core bx of any  significant soft tissue mass   DDH

## 2021-09-11 ENCOUNTER — Other Ambulatory Visit (HOSPITAL_COMMUNITY): Payer: Self-pay | Admitting: Physician Assistant

## 2021-09-12 ENCOUNTER — Other Ambulatory Visit (HOSPITAL_COMMUNITY): Payer: Self-pay | Admitting: Surgery

## 2021-09-12 ENCOUNTER — Encounter (HOSPITAL_COMMUNITY): Payer: Self-pay

## 2021-09-12 ENCOUNTER — Ambulatory Visit (HOSPITAL_COMMUNITY)
Admission: RE | Admit: 2021-09-12 | Discharge: 2021-09-12 | Disposition: A | Discharge status: Home / Self Care | Payer: Medicare PPO | Source: Ambulatory Visit | Attending: Surgery | Admitting: Surgery

## 2021-09-12 DIAGNOSIS — D171 Benign lipomatous neoplasm of skin and subcutaneous tissue of trunk: Secondary | ICD-10-CM | POA: Diagnosis present

## 2021-09-12 DIAGNOSIS — R2241 Localized swelling, mass and lump, right lower limb: Secondary | ICD-10-CM | POA: Insufficient documentation

## 2021-09-12 DIAGNOSIS — E119 Type 2 diabetes mellitus without complications: Secondary | ICD-10-CM | POA: Diagnosis not present

## 2021-09-12 LAB — GLUCOSE, CAPILLARY: Glucose-Capillary: 104 mg/dL — ABNORMAL HIGH (ref 70–99)

## 2021-09-12 MED ORDER — SODIUM CHLORIDE 0.9 % IV SOLN
INTRAVENOUS | Status: DC
  Administered 2021-09-12: 10 mL/h via INTRAVENOUS

## 2021-09-12 MED ORDER — FENTANYL CITRATE (PF) 100 MCG/2ML IJ SOLN
INTRAMUSCULAR | Status: AC
  Filled 2021-09-12: qty 2

## 2021-09-12 MED ORDER — MIDAZOLAM HCL 2 MG/2ML IJ SOLN
INTRAMUSCULAR | Status: AC | PRN
  Administered 2021-09-12: .5 mg via INTRAVENOUS

## 2021-09-12 MED ORDER — MIDAZOLAM HCL 2 MG/2ML IJ SOLN
INTRAMUSCULAR | Status: AC
  Filled 2021-09-12: qty 2

## 2021-09-12 MED ORDER — FENTANYL CITRATE (PF) 100 MCG/2ML IJ SOLN
INTRAMUSCULAR | Status: AC | PRN
  Administered 2021-09-12: 25 ug via INTRAVENOUS

## 2021-09-12 MED ORDER — LIDOCAINE HCL (PF) 1 % IJ SOLN
INTRAMUSCULAR | Status: AC
  Filled 2021-09-12: qty 30

## 2021-09-12 NOTE — H&P (Signed)
Chief Complaint: Right thigh mass  Referring Physician(s): Boyd  Supervising Physician: Mir, Sharen Heck  Patient Status: [facility] - Out-pt  History of Present Illness: Diana [name] is a 88 y.o. female who was seen by Dr. Bobbye Morton for a right thigh mass.  Per her notes, it has been presented ~1 year.   It is not painful, but is enlarging.   US of the right leg done 07/16/2021 showed=  A 2.9 x 4.0 x 1.9 cm lobular lesion abutting the right common femoral vein within the subcutaneous soft tissues of the medial right thigh with enhanced through transmission. No associated increased vascularity. No color Doppler flow within this cystic structure. Smaller component of the cyst appears to be slightly complex.     She is here today for aspiration of the cyst.   She is NPO. No nausea/vomiting. No Fever/chills. ROS negative.   Past Medical History:  Diagnosis Date   Anemia    Anemia of decreased vitamin B12 absorption 05/26/2005   Arthritis    Atrial fibrillation ([place])    Breast cancer ([place]) 2001   Cataract    GERD (gastroesophageal reflux disease)    Hiatal hernia    History of tobacco abuse    Hx of colonic polyps    Hyperlipidemia    Hypertension    Hypothyroidism    Memory loss    Obesity    OSA on CPAP 09/2007   AHI 10.6/hr overall, 43.64/hr during REM, lost weight, does not use cpap   Osteopenia    Renal insufficiency    Type 2 diabetes mellitus [facility])     Past Surgical History:  Procedure Laterality Date   BACK SURGERY  05/30/2009   BIOPSY  01/01/2019   Procedure: BIOPSY;  Surgeon: Milus Banister, MD;  Location: [place];  Service: Endoscopy;;   BOWEL RESECTION  1974   CATARACT EXTRACTION Bilateral    bilateral   CHOLECYSTECTOMY  1974   COLONOSCOPY     ESOPHAGOGASTRODUODENOSCOPY N/A 01/01/2019   Procedure: ESOPHAGOGASTRODUODENOSCOPY (EGD);  Surgeon: Milus Banister, MD;  Location[facility] ENDOSCOPY;  Service: Endoscopy;  Laterality: N/A;   EXTERNAL  FIXATION REMOVAL Left 04/27/2020   Procedure: REMOVAL EXTERNAL FIXATION ARM;  Surgeon: Shona Needles, MD;  Location: [place];  Service: Orthopedics;  Laterality: Left;   MASTECTOMY Bilateral 2001   bilateral sentinel lymph nodes bio   NM MYOCAR PERF WALL MOTION  06/2007   dipyridamole; perfusion defect in inferior myocardium consistent with diaphragmatic attenuation, remaining myocardium with no ischemia/infarct, EF 73%; normal, low risk scan    ORIF ELBOW FRACTURE Left 03/21/2020   Procedure: OPEN REDUCTION INTERNAL FIXATION (ORIF) ELBOW/OLECRANON FRACTURE;  Surgeon: Shona Needles, MD;  Location: [place];  Service: Orthopedics;  Laterality: Left;   TOTAL ABDOMINAL HYSTERECTOMY  1975   TRANSTHORACIC ECHOCARDIOGRAM  02/2010   WV=>37%, stage 1 diastolic dysfunction; borderline RV enlargement; LA mild-mod dilated; mild mitral annular calcif & mild MR; mild TR with normal RSVP, AV moderately sclerotics    Allergies: Codeine sulfate, Sps [sodium polystyrene sulfonate], and Sulfa antibiotics  Medications: Prior to Admission medications   Medication Sig Start Date End Date Taking? Authorizing Provider  amLODipine (NORVASC) 5 MG tablet Take 5 mg by mouth daily.   Yes [provider]  brimonidine-timolol (COMBIGAN) 0.2-0.5 % ophthalmic solution Place 1 drop into both eyes every 12 (twelve) hours. 05/26/20  Yes Medina-Vargas, Monina C, NP  ferrous sulfate 325 (65 FE) MG EC tablet Take 325 mg  by mouth daily.   Yes [provider]  FLUoxetine (PROZAC) 20 MG capsule Take 1 capsule (20 mg total) by mouth daily. 05/26/20  Yes Medina-Vargas, Monina C, NP  insulin glargine (LANTUS) 100 UNIT/ML Solostar Pen Inject 7 Units into the skin at bedtime. Patient taking differently: Inject 10 Units into the skin at bedtime. 07/06/20  Yes Caren Griffins, MD  levothyroxine (SYNTHROID) 50 MCG tablet Take 1 tablet (50 mcg total) by mouth daily. 05/26/20  Yes Medina-Vargas, Monina C, NP  losartan (COZAAR)  50 MG tablet Take 1 tablet (50 mg total) by mouth 2 (two) times daily. 05/26/20  Yes Medina-Vargas, Monina C, NP  metFORMIN (GLUCOPHAGE) 500 MG tablet Take 500 mg by mouth 2 (two) times daily with a meal.   Yes [provider]  metoprolol tartrate (LOPRESSOR) 25 MG tablet Take 0.5 tablets (12.5 mg total) by mouth 2 (two) times daily. 05/26/20  Yes Medina-Vargas, Monina C, NP  Multiple Vitamins-Minerals (PRESERVISION AREDS 2 PO) Take 1 capsule by mouth daily.   Yes [provider]  pantoprazole (PROTONIX) 40 MG tablet Take 1 tablet (40 mg total) by mouth 2 (two) times daily before a meal. 05/26/20  Yes Medina-Vargas, Monina C, NP  rosuvastatin (CRESTOR) 10 MG tablet Take 10 mg by mouth at bedtime.   Yes [provider]     Family History  Problem Relation Age of Onset   Heart attack Mother    Heart attack Father    Colon cancer Neg Hx     Social History   Socioeconomic History   Marital status: Married    Spouse name: Not on file   Number of children: Not on file   Years of education: Not on file   Highest education level: Not on file  Occupational History   Not on file  Tobacco Use   Smoking status: Former    Years: 13.00    Types: Cigarettes    Quit date: 05/02/2002    Years since quitting: 19.3   Smokeless tobacco: Never  Vaping Use   Vaping Use: Never used  Substance and Sexual Activity   Alcohol use: No    Alcohol/week: 0.0 standard drinks of alcohol   Drug use: Never   Sexual activity: Not Currently    Birth control/protection: Surgical    Comment: Hysterectomy  Other Topics Concern   Not on file  Social History Narrative   Not on file   Social Determinants of Health   Financial Resource Strain: Not on file  Food Insecurity: Not on file  Transportation Needs: Not on file  Physical Activity: Not on file  Stress: Not on file  Social Connections: Not on file     Review of Systems: A 12 point ROS discussed and pertinent positives are  indicated in the HPI above.  All other systems are negative.  Review of Systems  Vital Signs: BP (!) 145/84   Pulse (!) 54   Temp 97.9 F (36.6 C) (Oral)   Resp 16   Ht '5\' 8"'$  (1.727 m)   Wt 194 lb (88 kg)   LMP  (LMP Unknown)   SpO2 94%   BMI 29.50 kg/m   Physical Exam Vitals reviewed.  Constitutional:      Appearance: Normal appearance.  HENT:     Head: Normocephalic and atraumatic.  Eyes:     Extraocular Movements: Extraocular movements intact.  Cardiovascular:     Rate and Rhythm: Normal rate. Rhythm irregular.     Heart  sounds: Murmur heard.  Pulmonary:     Effort: Pulmonary effort is normal. No respiratory distress.     Breath sounds: Normal breath sounds.  Abdominal:     Palpations: Abdomen is soft.  Musculoskeletal:        General: Normal range of motion.     Cervical back: Normal range of motion.  Skin:    General: Skin is warm and dry.  Neurological:     General: No focal deficit present.     Mental Status: She is alert and oriented to person, place, and time.  Psychiatric:        Mood and Affect: Mood normal.        Behavior: Behavior normal.        Thought Content: Thought content normal.        Judgment: Judgment normal.     Imaging: US EXTREMITY RIGHT NON-VASCULAR LIMITED   INDICATION:  \ R22.41 Mass of right thigh  COMPARISON: None.   TECHNIQUE: Grayscale and color sonography of the medial right thigh was performed.   FINDINGS: There is a 2.9 x 4.0 x 1.9 cm lobular lesion abutting the right common femoral vein within the subcutaneous soft tissues of the medial right thigh with enhanced through transmission. No associated increased vascularity. No color Doppler flow within this cystic structure. Smaller component of the cyst appears to be slightly complex.   CONCLUSION:   4 cm avascular and mildly complex cyst abutting the right common femoral vein.  Labs:  CBC: Recent Labs    11/30/20 1336 05/06/21 0855  WBC 7.7 7.9  HGB 14.2 12.7   HCT 43.7 40.0  PLT 161 184    COAGS: No results for input(s): "INR", "APTT" in the last 8760 hours.  BMP: Recent Labs    11/30/20 1336 05/06/21 0855  NA 137 139  K 4.5 3.9  CL 103 104  CO2 26 27  GLUCOSE 249* 180*  BUN 27* 19  CALCIUM 8.7* 8.4*  CREATININE 1.31* 1.26*  GFRNONAA 40* 42*    LIVER FUNCTION TESTS: No results for input(s): "BILITOT", "AST", "ALT", "ALKPHOS", "PROT", "ALBUMIN" in the last 8760 hours.  TUMOR MARKERS: No results for input(s): "AFPTM", "CEA", "CA199", "CHROMGRNA" in the last 8760 hours.  Assessment and Plan:  A 2.9 x 4.0 x 1.9 cm lobular lesion abutting the right common femoral vein within the subcutaneous soft tissues of the medial right thigh = appears cystic.  Will proceed with image guided aspiration today by Dr. Dwaine Gale.  Risks and benefits of aspiration were discussed with the patient including bleeding, infection, damage to adjacent structures, and sepsis.  All of the patient's questions were answered, patient is agreeable to proceed. Consent signed and in chart.  Thank you for allowing our service to participate in Diana [name] 's care.  Electronically Signed: Murrell Redden, PA-C   09/12/2021, 12:40 PM      I spent a total of  15 Minutes in face to face in clinical consultation, greater than 50% of which was counseling/coordinating care for right thigh mass aspiration.

## 2021-09-12 NOTE — Procedures (Signed)
Interventional Radiology Procedure Note  Procedure: US guided aspiration of right thigh cyst  Indication: Right thigh cyst  Findings: Please refer to procedural dictation for full description.  Complications: None  EBL: < 10 mL  Miachel Roux, MD [phone]

## 2021-09-13 LAB — CYTOLOGY - NON PAP

## 2021-10-05 ENCOUNTER — Encounter (HOSPITAL_BASED_OUTPATIENT_CLINIC_OR_DEPARTMENT_OTHER): Payer: Self-pay

## 2021-10-05 ENCOUNTER — Emergency Department (HOSPITAL_BASED_OUTPATIENT_CLINIC_OR_DEPARTMENT_OTHER): Payer: Medicare PPO | Admitting: Radiology

## 2021-10-05 ENCOUNTER — Emergency Department (HOSPITAL_BASED_OUTPATIENT_CLINIC_OR_DEPARTMENT_OTHER): Payer: Medicare PPO

## 2021-10-05 ENCOUNTER — Emergency Department (HOSPITAL_BASED_OUTPATIENT_CLINIC_OR_DEPARTMENT_OTHER)
Admission: EM | Admit: 2021-10-05 | Discharge: 2021-10-05 | Disposition: A | Discharge status: Home / Self Care | Payer: Medicare PPO | Attending: Emergency Medicine | Admitting: Emergency Medicine

## 2021-10-05 ENCOUNTER — Other Ambulatory Visit: Payer: Self-pay

## 2021-10-05 DIAGNOSIS — Z79899 Other long term (current) drug therapy: Secondary | ICD-10-CM | POA: Insufficient documentation

## 2021-10-05 DIAGNOSIS — E1122 Type 2 diabetes mellitus with diabetic chronic kidney disease: Secondary | ICD-10-CM | POA: Insufficient documentation

## 2021-10-05 DIAGNOSIS — R609 Edema, unspecified: Secondary | ICD-10-CM

## 2021-10-05 DIAGNOSIS — N390 Urinary tract infection, site not specified: Secondary | ICD-10-CM | POA: Diagnosis not present

## 2021-10-05 DIAGNOSIS — N183 Chronic kidney disease, stage 3 unspecified: Secondary | ICD-10-CM | POA: Diagnosis not present

## 2021-10-05 DIAGNOSIS — Z7984 Long term (current) use of oral hypoglycemic drugs: Secondary | ICD-10-CM | POA: Diagnosis not present

## 2021-10-05 DIAGNOSIS — R600 Localized edema: Secondary | ICD-10-CM | POA: Diagnosis not present

## 2021-10-05 DIAGNOSIS — R059 Cough, unspecified: Secondary | ICD-10-CM | POA: Diagnosis present

## 2021-10-05 DIAGNOSIS — F0390 Unspecified dementia without behavioral disturbance: Secondary | ICD-10-CM | POA: Insufficient documentation

## 2021-10-05 LAB — CBC
HCT: 38.5 % (ref 36.0–46.0)
Hemoglobin: 12.4 g/dL (ref 12.0–15.0)
MCH: 30 pg (ref 26.0–34.0)
MCHC: 32.2 g/dL (ref 30.0–36.0)
MCV: 93.2 fL (ref 80.0–100.0)
Platelets: 220 10*3/uL (ref 150–400)
RBC: 4.13 MIL/uL (ref 3.87–5.11)
RDW: 14.4 % (ref 11.5–15.5)
WBC: 7.5 10*3/uL (ref 4.0–10.5)
nRBC: 0 % (ref 0.0–0.2)

## 2021-10-05 LAB — TROPONIN I (HIGH SENSITIVITY)
Troponin I (High Sensitivity): 14 ng/L (ref <18)
Troponin I (High Sensitivity): 13 ng/L (ref <18)

## 2021-10-05 LAB — URINALYSIS, ROUTINE W REFLEX MICROSCOPIC
Bilirubin Urine: NEGATIVE
Glucose, UA: NEGATIVE mg/dL
Hgb urine dipstick: NEGATIVE
Ketones, ur: NEGATIVE mg/dL
Nitrite: NEGATIVE
Protein, ur: 100 mg/dL — AB
Specific Gravity, Urine: 1.017 (ref 1.005–1.030)
WBC, UA: >50 WBC/hpf — ABNORMAL HIGH (ref 0–5)
pH: 5.5 (ref 5.0–8.0)

## 2021-10-05 LAB — BRAIN NATRIURETIC PEPTIDE: B Natriuretic Peptide: 523.7 pg/mL — ABNORMAL HIGH (ref 0.0–100.0)

## 2021-10-05 LAB — BASIC METABOLIC PANEL
Anion gap: 14 (ref 5–15)
BUN: 27 mg/dL — ABNORMAL HIGH (ref 8–23)
CO2: 27 mmol/L (ref 22–32)
Calcium: 9.6 mg/dL (ref 8.9–10.3)
Chloride: 101 mmol/L (ref 98–111)
Creatinine, Ser: 1.51 mg/dL — ABNORMAL HIGH (ref 0.44–1.00)
GFR, Estimated: 33 mL/min — ABNORMAL LOW (ref >60)
Glucose, Bld: 104 mg/dL — ABNORMAL HIGH (ref 70–99)
Potassium: 4.3 mmol/L (ref 3.5–5.1)
Sodium: 142 mmol/L (ref 135–145)

## 2021-10-05 MED ORDER — FUROSEMIDE 20 MG PO TABS: 20.0000 mg | ORAL_TABLET | Freq: Every morning | ORAL | 0 refills | Status: DC

## 2021-10-05 MED ORDER — CIPROFLOXACIN HCL 500 MG PO TABS: 500.0000 mg | ORAL_TABLET | Freq: Two times a day (BID) | ORAL | 0 refills | Status: DC

## 2021-10-05 NOTE — Discharge Instructions (Addendum)
Please take antibiotic as prescribed for suspected urinary tract infection.  Please follow-up with your primary care doctor.  Please follow-up with orthopedic surgery regarding the elbow.  Additionally recommend following up with your cardiologist to discuss your symptoms further.  Start taking the Lasix medication in the morning.  This will help with the leg swelling.  It will cause you to urinate more.  If she develops any worsening confusion, shortness of breath, or other new concerning symptom, come back to ER for reassessment.

## 2021-10-05 NOTE — ED Triage Notes (Signed)
Patient here POV from UC.  Endorses UTI Symptoms for 3 Weeks that she has been taking Antibiotics for.   2-3 Falls in the Past Few Weeks due to Weakness.  Patient Possibly more Altered from Baseline per Visitor.   Sent for Evaluation due to Possible PNA as Patient had several XRAY Images taken for Fall  Mild SOB per Patient. Mildly Productive Cough. No Known Fevers.   NAD Noted during Triage. A&Ox4. GCS 15. BIB Wheelchair.

## 2021-10-05 NOTE — ED Provider Notes (Signed)
Patient initially seen by Dr. Roslynn Amble.  Please see his note.  Plan was to follow-up on the patient's BNP.  BNP is slightly elevated.  She does have some vascular congestion on chest x-ray and has been complaining some peripheral edema.  Patient is breathing easily.  She does not have any respiratory difficulty.  She would still like to go home.  We will plan on discharge with antibiotics and small dose of Lasix.  Close outpatient follow-up discussed.   Dorie Rank, MD 10/05/21 [phone]

## 2021-10-05 NOTE — ED Provider Notes (Signed)
[facility] DEPT Provider Note   CSN: [id number] Arrival date & time: 10/05/21  1033     History  Chief Complaint  Patient presents with   Cough    Diana [name] is a 88 y.o. female.  Presents emerged department due to concern for multiple different complaints.  History is obtained from patient as well as from her caregiver/friend at bedside.  The patient states that she had a fall last couple days ago.  She did not pass out, she does not recall exactly how she fell.  She thinks that she may have hit the back of her head.  The caregiver reports that patient had surgery for an elbow dislocation but after surgery the elbow became dislocated and has remained out of place ever since.  Patient currently denies any sort of extremity pain.  The caregiver also endorses that she has had slight cough and has noted some shortness of breath.  Patient currently denies any shortness of breath.  Caregiver reports patient has history of atrial fibrillation but she does not take anticoagulation due to history of frequent falls.  Patient was taken to urgent care earlier today and was sent to ER for further evaluation.  Reviewed last urine culture data, February 2023, resistant to cefazolin, Macrobid, ampicillin, does have sensitivity to ciprofloxacin;   Abnormal  >=100,000 COLONIES/mL ENTEROBACTER CLOACAE  >=100,000 COLONIES/mL MORGANELLA MORGANII   HPI     Home Medications Prior to Admission medications   Medication Sig Start Date End Date Taking? Authorizing Provider  ciprofloxacin (CIPRO) 500 MG tablet Take 1 tablet (500 mg total) by mouth every 12 (twelve) hours for 7 days. 10/05/21 10/12/21 Yes Brittie Whisnant, Ellwood Dense, MD  amLODipine (NORVASC) 5 MG tablet Take 5 mg by mouth daily.    [provider]  brimonidine-timolol (COMBIGAN) 0.2-0.5 % ophthalmic solution Place 1 drop into both eyes every 12 (twelve) hours. 05/26/20   Medina-Vargas, Monina C, NP  ferrous  sulfate 325 (65 FE) MG EC tablet Take 325 mg by mouth daily.    [provider]  FLUoxetine (PROZAC) 20 MG capsule Take 1 capsule (20 mg total) by mouth daily. 05/26/20   Medina-Vargas, Monina C, NP  insulin glargine (LANTUS) 100 UNIT/ML Solostar Pen Inject 7 Units into the skin at bedtime. Patient taking differently: Inject 10 Units into the skin at bedtime. 07/06/20   Caren Griffins, MD  levothyroxine (SYNTHROID) 50 MCG tablet Take 1 tablet (50 mcg total) by mouth daily. 05/26/20   Medina-Vargas, Monina C, NP  losartan (COZAAR) 50 MG tablet Take 1 tablet (50 mg total) by mouth 2 (two) times daily. 05/26/20   Medina-Vargas, Monina C, NP  metFORMIN (GLUCOPHAGE) 500 MG tablet Take 500 mg by mouth 2 (two) times daily with a meal.    [provider]  metoprolol tartrate (LOPRESSOR) 25 MG tablet Take 0.5 tablets (12.5 mg total) by mouth 2 (two) times daily. 05/26/20   Medina-Vargas, Monina C, NP  Multiple Vitamins-Minerals (PRESERVISION AREDS 2 PO) Take 1 capsule by mouth daily.    [provider]  pantoprazole (PROTONIX) 40 MG tablet Take 1 tablet (40 mg total) by mouth 2 (two) times daily before a meal. 05/26/20   Medina-Vargas, Monina C, NP  rosuvastatin (CRESTOR) 10 MG tablet Take 10 mg by mouth at bedtime.    [provider]      Allergies    Codeine sulfate, Sps [sodium polystyrene sulfonate], and Sulfa antibiotics    Review of Systems  Review of Systems  Constitutional:  Negative for chills and fever.  HENT:  Negative for ear pain and sore throat.   Eyes:  Negative for pain and visual disturbance.  Respiratory:  Negative for cough and shortness of breath.   Cardiovascular:  Negative for chest pain and palpitations.  Gastrointestinal:  Negative for abdominal pain and vomiting.  Genitourinary:  Negative for dysuria and hematuria.  Musculoskeletal:  Negative for arthralgias and back pain.  Skin:  Negative for color change and rash.  Neurological:  Negative  for seizures and syncope.  All other systems reviewed and are negative.   Physical Exam Updated Vital Signs BP (!) 154/75   Pulse 73   Temp 98.2 F (36.8 C)   Resp 15   Ht '5\' 8"'$  (1.727 m)   Wt 88 kg   LMP  (LMP Unknown)   SpO2 98%   BMI 29.50 kg/m  Physical Exam Vitals and nursing note reviewed.  Constitutional:      General: She is not in acute distress.    Appearance: She is well-developed.  HENT:     Head: Normocephalic and atraumatic.  Eyes:     Conjunctiva/sclera: Conjunctivae normal.  Cardiovascular:     Rate and Rhythm: Normal rate and regular rhythm.     Heart sounds: No murmur heard. Pulmonary:     Effort: Pulmonary effort is normal. No respiratory distress.     Breath sounds: Normal breath sounds.  Abdominal:     Palpations: Abdomen is soft.     Tenderness: There is no abdominal tenderness.  Musculoskeletal:        General: No swelling.     Cervical back: Neck supple.     Comments: Back: no C, T, L spine TTP, no step off or deformity RUE: no TTP throughout, no deformity, normal joint ROM, radial pulse intact, distal sensation and motor intact LUE: no TTP throughout, no deformity, normal joint ROM, radial pulse intact, distal sensation and motor intact RLE:  no TTP throughout, no deformity, normal joint ROM, distal pulse, sensation and motor intact LLE: no TTP throughout, no deformity, normal joint ROM, distal pulse, sensation and motor intact  Skin:    General: Skin is warm and dry.     Capillary Refill: Capillary refill takes less than 2 seconds.  Neurological:     Mental Status: She is alert.  Psychiatric:        Mood and Affect: Mood normal.     ED Results / Procedures / Treatments   Labs (all labs ordered are listed, but only abnormal results are displayed) Labs Reviewed  BASIC METABOLIC PANEL - Abnormal; Notable for the following components:      Result Value   Glucose, Bld 104 (*)    BUN 27 (*)    Creatinine, Ser 1.51 (*)    GFR, Estimated  33 (*)    All other components within normal limits  URINALYSIS, ROUTINE W REFLEX MICROSCOPIC - Abnormal; Notable for the following components:   APPearance HAZY (*)    Protein, ur 100 (*)    Leukocytes,Ua MODERATE (*)    WBC, UA >50 (*)    Bacteria, UA MANY (*)    All other components within normal limits  BRAIN NATRIURETIC PEPTIDE - Abnormal; Notable for the following components:   B Natriuretic Peptide 523.7 (*)    All other components within normal limits  CBC  TROPONIN I (HIGH SENSITIVITY)  TROPONIN I (HIGH SENSITIVITY)    EKG EKG Interpretation  Date/Time:  Saturday October 05 2021 11:15:20 EDT Ventricular Rate:  73 PR Interval:    QRS Duration: 155 QT Interval:  477 QTC Calculation: 526 R Axis:   270 Text Interpretation: Atrial fibrillation Left bundle branch block Confirmed by Madalyn Rob [phone]) on 10/05/2021 3:41:53 PM  Radiology DG Chest 2 View  Result Date: 10/05/2021 CLINICAL DATA:  Shortness of breath EXAM: CHEST - 2 VIEW COMPARISON:  05/06/2021 FINDINGS: Stable cardiomegaly. Aortic atherosclerosis. Pulmonary vascular congestion. Small bilateral pleural effusions. No focal airspace consolidation. No pneumothorax. IMPRESSION: Cardiomegaly with pulmonary vascular congestion and small bilateral pleural effusions. Electronically Signed   By: Davina Poke D.O.   On: 10/05/2021 14:37   CT Cervical Spine Wo Contrast  Result Date: 10/05/2021 CLINICAL DATA:  Trauma, fall EXAM: CT CERVICAL SPINE WITHOUT CONTRAST TECHNIQUE: Multidetector CT imaging of the cervical spine was performed without intravenous contrast. Multiplanar CT image reconstructions were also generated. RADIATION DOSE REDUCTION: This exam was performed according to the departmental dose-optimization program which includes automated exposure control, adjustment of the mA and/or kV according to patient size and/or use of iterative reconstruction technique. COMPARISON:  05/06/2021 FINDINGS: Alignment:  Alignment of posterior margins of the vertebral bodies is essentially unremarkable. Skull base and vertebrae: No recent fracture is seen. Small smooth marginated calcification posterior to the spinous process of C6 vertebra may suggest a ligament calcification from previous injury. Soft tissues and spinal canal: There is no central spinal stenosis. Disc levels: There is minimal encroachment of right neural foramen at C2-C3 level. There is mild encroachment of right neural foramen at C3-C4 level. Mild to moderate encroachment of neural foramina is seen from C4-C7 levels. Upper chest: Unremarkable. Other: Right lobe of thyroid is larger than left with multiple nodules including 1.9 cm calcified nodule. This finding is stable. Right mastoid effusion is seen. IMPRESSION: No recent fracture is seen in cervical spine. Cervical spondylosis with encroachment of neural foramina as described in the body of the report. Electronically Signed   By: Elmer Picker M.D.   On: 10/05/2021 13:21   CT Head Wo Contrast  Result Date: 10/05/2021 CLINICAL DATA:  Trauma, fall EXAM: CT HEAD WITHOUT CONTRAST TECHNIQUE: Contiguous axial images were obtained from the base of the skull through the vertex without intravenous contrast. RADIATION DOSE REDUCTION: This exam was performed according to the departmental dose-optimization program which includes automated exposure control, adjustment of the mA and/or kV according to patient size and/or use of iterative reconstruction technique. COMPARISON:  05/06/2021 FINDINGS: Brain: No acute intracranial findings are seen. There are no signs of bleeding within the cranium. Symmetric calcifications are seen in basal ganglia. Cortical sulci are prominent. There is decreased density in periventricular and subcortical white matter. Vascular: Scattered arterial calcifications are seen. Skull: Unremarkable. Sinuses/Orbits: Visualized paranasal sinuses show mild mucosal thickening in the ethmoid  sinus. Orbits are unremarkable. There is fluid in some of the right mastoid air cells. Other: No significant interval changes are noted. IMPRESSION: No acute intracranial findings are seen in noncontrast CT brain. Atrophy. Small-vessel disease. Mild chronic ethmoid sinusitis.  Right mastoid effusion. Electronically Signed   By: Elmer Picker M.D.   On: 10/05/2021 13:15   DG Elbow Complete Left  Result Date: 10/05/2021 CLINICAL DATA:  Fall 2-3 days ago with possible dislocation of the left elbow. History of elbow fracture or dislocation approximately year ago status post external fixation. EXAM: LEFT ELBOW - COMPLETE 3+ VIEW COMPARISON:  Left elbow radiograph 02/07/2020. FINDINGS: Anterior dislocation of the left elbow joint. Marked  heterotopic ossification at sites of prior lateral epicondyle and coronoid process fractures. No definite acute fracture. Ghost screw tracts are noted in the mid diaphysis of the ulna and distal left humeral shaft. IMPRESSION: Anterior dislocation of the left elbow. No definite acute fracture though marked heterotopic ossification about the elbow joint obscures fine osseous detail. If there is high clinical concern for fracture, recommend cross-sectional imaging. Electronically Signed   By: Ileana Roup M.D.   On: 10/05/2021 12:03    Procedures Procedures    Medications Ordered in ED Medications - No data to display  ED Course/ Medical Decision Making/ A&P                           Medical Decision Making Amount and/or Complexity of Data Reviewed Labs: ordered. Radiology: ordered.   88 year old lady presenting to the emergency department due to concern for multiple different complaints.  Her caregiver endorsed concerns about falls, last fall couple days ago.  Patient does think that she hit her head.  Check CT of head, C-spine, no acute traumatic process identified.  Had earlier endorsed elbow pain but when I interviewed patient she denied any elbow pain.  She  has dislocation.  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  I recommend that she follow-up with her orthopedic surgeon about this to discuss management further.  She is neurovascularly intact and feel this can be managed on an outpatient basis.  Caregiver endorsed concerns about periodic confusion, agitation.  Currently the patient is at her baseline mental status, she is answering questions appropriately, pleasant, calm.  She does have a urinary tract infection.  No significant electrolyte derangement.  The caregiver noted some concerns about shortness of breath though patient does not have any complaints of shortness of breath at this time.  Her chest x-ray was obtained and demonstrates cardiomegaly with some pulmonary vascular congestion and small bilateral pleural effusions.  She has slight swelling in her legs but no significant pitting edema was appreciated.  Troponin is within normal limits.  Her EKG demonstrates atrial fibrillation, left bundle branch block is noted.  Patient however denies any sort of chest pain.  Have low clinical suspicion for ACS at this time.  Will check BNP as well.  While awaiting BNP, reassessment, signed out to Dr. Tomi Bamberger.   Patient was very adamant that she would like to be discharged regardless of results.  Will defer final plan and disposition to Dr. Tomi Bamberger.   Additional hx obtained from review of last dc summary in June 2022 -  paroxysmal atrial fibrillation, mild dementia insulin-dependent diabetes mellitus, chronic kidney disease stage III; admitted for ICH, eliquis discontinued        Final Clinical Impression(s) / ED Diagnoses Final diagnoses:  Urinary tract infection without hematuria, site unspecified    Rx / DC Orders ED Discharge Orders          Ordered    ciprofloxacin (CIPRO) 500 MG tablet  Every 12 hours        10/05/21 1540              Lucrezia Starch, MD 10/05/21 1549

## 2021-10-05 NOTE — ED Notes (Signed)
Dc instructions reviewed with patient. Patient voiced understanding. Dc with belongings.  °

## 2021-10-12 ENCOUNTER — Other Ambulatory Visit: Payer: Self-pay

## 2021-10-12 ENCOUNTER — Emergency Department (HOSPITAL_BASED_OUTPATIENT_CLINIC_OR_DEPARTMENT_OTHER): Payer: Medicare PPO | Admitting: Radiology

## 2021-10-12 ENCOUNTER — Emergency Department (HOSPITAL_BASED_OUTPATIENT_CLINIC_OR_DEPARTMENT_OTHER)
Admission: EM | Admit: 2021-10-12 | Discharge: 2021-10-12 | Disposition: A | Discharge status: Skilled Nursing Facility | Payer: Medicare PPO | Attending: Emergency Medicine | Admitting: Emergency Medicine

## 2021-10-12 ENCOUNTER — Encounter (HOSPITAL_BASED_OUTPATIENT_CLINIC_OR_DEPARTMENT_OTHER): Payer: Self-pay

## 2021-10-12 DIAGNOSIS — R059 Cough, unspecified: Secondary | ICD-10-CM | POA: Diagnosis present

## 2021-10-12 DIAGNOSIS — Z7984 Long term (current) use of oral hypoglycemic drugs: Secondary | ICD-10-CM | POA: Diagnosis not present

## 2021-10-12 DIAGNOSIS — J181 Lobar pneumonia, unspecified organism: Secondary | ICD-10-CM | POA: Insufficient documentation

## 2021-10-12 DIAGNOSIS — E039 Hypothyroidism, unspecified: Secondary | ICD-10-CM | POA: Insufficient documentation

## 2021-10-12 DIAGNOSIS — Z79899 Other long term (current) drug therapy: Secondary | ICD-10-CM | POA: Diagnosis not present

## 2021-10-12 DIAGNOSIS — I4891 Unspecified atrial fibrillation: Secondary | ICD-10-CM | POA: Insufficient documentation

## 2021-10-12 DIAGNOSIS — Z794 Long term (current) use of insulin: Secondary | ICD-10-CM | POA: Diagnosis not present

## 2021-10-12 DIAGNOSIS — Z7901 Long term (current) use of anticoagulants: Secondary | ICD-10-CM | POA: Diagnosis not present

## 2021-10-12 DIAGNOSIS — F0390 Unspecified dementia without behavioral disturbance: Secondary | ICD-10-CM | POA: Insufficient documentation

## 2021-10-12 DIAGNOSIS — J189 Pneumonia, unspecified organism: Secondary | ICD-10-CM

## 2021-10-12 DIAGNOSIS — E119 Type 2 diabetes mellitus without complications: Secondary | ICD-10-CM | POA: Insufficient documentation

## 2021-10-12 DIAGNOSIS — I10 Essential (primary) hypertension: Secondary | ICD-10-CM | POA: Diagnosis not present

## 2021-10-12 LAB — CBC WITH DIFFERENTIAL/PLATELET
Abs Immature Granulocytes: 0.06 10*3/uL (ref 0.00–0.07)
Basophils Absolute: 0.1 10*3/uL (ref 0.0–0.1)
Basophils Relative: 1 %
Eosinophils Absolute: 0.1 10*3/uL (ref 0.0–0.5)
Eosinophils Relative: 1 %
HCT: 40.8 % (ref 36.0–46.0)
Hemoglobin: 13.3 g/dL (ref 12.0–15.0)
Immature Granulocytes: 1 %
Lymphocytes Relative: 7 %
Lymphs Abs: 0.6 10*3/uL — ABNORMAL LOW (ref 0.7–4.0)
MCH: 30 pg (ref 26.0–34.0)
MCHC: 32.6 g/dL (ref 30.0–36.0)
MCV: 92.1 fL (ref 80.0–100.0)
Monocytes Absolute: 0.4 10*3/uL (ref 0.1–1.0)
Monocytes Relative: 4 %
Neutro Abs: 7.4 10*3/uL (ref 1.7–7.7)
Neutrophils Relative %: 86 %
Platelets: 242 10*3/uL (ref 150–400)
RBC: 4.43 MIL/uL (ref 3.87–5.11)
RDW: 14.4 % (ref 11.5–15.5)
WBC: 8.5 10*3/uL (ref 4.0–10.5)
nRBC: 0 % (ref 0.0–0.2)

## 2021-10-12 LAB — BASIC METABOLIC PANEL
Anion gap: 16 — ABNORMAL HIGH (ref 5–15)
BUN: 27 mg/dL — ABNORMAL HIGH (ref 8–23)
CO2: 25 mmol/L (ref 22–32)
Calcium: 9 mg/dL (ref 8.9–10.3)
Chloride: 98 mmol/L (ref 98–111)
Creatinine, Ser: 1.51 mg/dL — ABNORMAL HIGH (ref 0.44–1.00)
GFR, Estimated: 33 mL/min — ABNORMAL LOW (ref >60)
Glucose, Bld: 186 mg/dL — ABNORMAL HIGH (ref 70–99)
Potassium: 4.1 mmol/L (ref 3.5–5.1)
Sodium: 139 mmol/L (ref 135–145)

## 2021-10-12 MED ORDER — AZITHROMYCIN 250 MG PO TABS: 250.0000 mg | ORAL_TABLET | Freq: Every day | ORAL | 0 refills | Status: DC

## 2021-10-12 MED ORDER — SODIUM CHLORIDE 0.9 % IV SOLN
500.0000 mg | Freq: Once | INTRAVENOUS | Status: DC
  Filled 2021-10-12: qty 5

## 2021-10-12 MED ORDER — CEFDINIR 300 MG PO CAPS: 300.0000 mg | ORAL_CAPSULE | Freq: Two times a day (BID) | ORAL | 0 refills | Status: AC

## 2021-10-12 MED ORDER — SODIUM CHLORIDE 0.9 % IV SOLN
1.0000 g | Freq: Once | INTRAVENOUS | Status: DC
  Filled 2021-10-12: qty 10

## 2021-10-12 NOTE — ED Triage Notes (Addendum)
Pt here POV from [facility]  Pt was seen here a week ago for a UTI, prescribed cipro and lasix.  Per care giver pt has had a cough x2-3 weeks, clear color sputum this morning and eventually became became a dark green/brown color.  NAD, A&O to self and location, disoriented to time and situation, BIB wheelchair   No coughing noted in triage, RR even and unlabored

## 2021-10-12 NOTE — ED Provider Notes (Signed)
[facility] Provider Note   CSN: [id number] Arrival date & time: 10/12/21  1135     History  Chief Complaint  Patient presents with   Cough    Diana [name] is a 88 y.o. female.  HPI     88 year old female with a history of dementia, diabetes, hypothyroidism, hypertension, hypercholesterolemia, atrial fibrillation on anticoagulation, recent treatment for UTI 2 weeks ago who presents with concern for cough  She is seen here a week ago for urinary tract infection, prescribed Cipro and Lasix.  She had a cough for 2 to 3 weeks, clear sputum this morning and eventually become more of a dark green/brown color.  Coughed here and there, a few episodes but today newly spit up sputum green  More swelling in legs last week, has been on lasix which has helped  Coughing to the point of gagging and vomiting Otherwise not complaining of nausea/vomiting  No CP, maybe winded going to bathroom and  back walking with a walker, no other significant dyspnea  99 temp this AM    Home Medications Prior to Admission medications   Medication Sig Start Date End Date Taking? Authorizing Provider  azithromycin (ZITHROMAX) 250 MG tablet Take 1 tablet (250 mg total) by mouth daily. Take first 2 tablets together, then 1 every day until finished. 10/12/21  Yes Gareth Morgan, MD  cefdinir (OMNICEF) 300 MG capsule Take 1 capsule (300 mg total) by mouth 2 (two) times daily for 7 days. 10/12/21 10/19/21 Yes Phallon Haydu, Junie Panning, MD  amLODipine (NORVASC) 5 MG tablet Take 5 mg by mouth daily.    [provider]  brimonidine-timolol (COMBIGAN) 0.2-0.5 % ophthalmic solution Place 1 drop into both eyes every 12 (twelve) hours. 05/26/20   Medina-Vargas, Monina C, NP  ferrous sulfate 325 (65 FE) MG EC tablet Take 325 mg by mouth daily.    [provider]  FLUoxetine (PROZAC) 20 MG capsule Take 1 capsule (20 mg total) by mouth daily. 05/26/20   Medina-Vargas, Monina C,  NP  furosemide (LASIX) 20 MG tablet Take 1 tablet (20 mg total) by mouth in the morning for 7 days. 10/05/21 10/12/21  Dorie Rank, MD  insulin glargine (LANTUS) 100 UNIT/ML Solostar Pen Inject 7 Units into the skin at bedtime. Patient taking differently: Inject 10 Units into the skin at bedtime. 07/06/20   Caren Griffins, MD  levothyroxine (SYNTHROID) 50 MCG tablet Take 1 tablet (50 mcg total) by mouth daily. 05/26/20   Medina-Vargas, Monina C, NP  losartan (COZAAR) 50 MG tablet Take 1 tablet (50 mg total) by mouth 2 (two) times daily. 05/26/20   Medina-Vargas, Monina C, NP  metFORMIN (GLUCOPHAGE) 500 MG tablet Take 500 mg by mouth 2 (two) times daily with a meal.    [provider]  metoprolol tartrate (LOPRESSOR) 25 MG tablet Take 0.5 tablets (12.5 mg total) by mouth 2 (two) times daily. 05/26/20   Medina-Vargas, Monina C, NP  Multiple Vitamins-Minerals (PRESERVISION AREDS 2 PO) Take 1 capsule by mouth daily.    [provider]  pantoprazole (PROTONIX) 40 MG tablet Take 1 tablet (40 mg total) by mouth 2 (two) times daily before a meal. 05/26/20   Medina-Vargas, Monina C, NP  rosuvastatin (CRESTOR) 10 MG tablet Take 10 mg by mouth at bedtime.    [provider]      Allergies    Codeine sulfate, Sps [sodium polystyrene sulfonate], and Sulfa antibiotics    Review of Systems   Review of Systems  Physical Exam Updated Vital Signs BP (!) 159/114 (BP Location: Left Arm)   Pulse 83   Temp 98.2 F (36.8 C) (Oral)   Resp 16   LMP  (LMP Unknown)   SpO2 100%  Physical Exam Vitals and nursing note reviewed.  Constitutional:      General: She is not in acute distress.    Appearance: She is well-developed. She is not diaphoretic.  HENT:     Head: Normocephalic and atraumatic.  Eyes:     Conjunctiva/sclera: Conjunctivae normal.  Cardiovascular:     Rate and Rhythm: Normal rate and regular rhythm.     Heart sounds: Normal heart sounds. No murmur heard.    No friction  rub. No gallop.  Pulmonary:     Effort: Pulmonary effort is normal. No respiratory distress.     Breath sounds: Normal breath sounds. No wheezing or rales.  Abdominal:     General: There is no distension.     Palpations: Abdomen is soft.     Tenderness: There is no abdominal tenderness. There is no guarding.  Musculoskeletal:        General: No tenderness.     Cervical back: Normal range of motion.  Skin:    General: Skin is warm and dry.     Findings: No erythema or rash.  Neurological:     Mental Status: She is alert and oriented to person, place, and time.     ED Results / Procedures / Treatments   Labs (all labs ordered are listed, but only abnormal results are displayed) Labs Reviewed  CBC WITH DIFFERENTIAL/PLATELET - Abnormal; Notable for the following components:      Result Value   Lymphs Abs 0.6 (*)    All other components within normal limits  BASIC METABOLIC PANEL - Abnormal; Notable for the following components:   Glucose, Bld 186 (*)    BUN 27 (*)    Creatinine, Ser 1.51 (*)    GFR, Estimated 33 (*)    Anion gap 16 (*)    All other components within normal limits    EKG EKG Interpretation  Date/Time:  Saturday October 12 2021 14:05:46 EDT Ventricular Rate:  83 PR Interval:    QRS Duration: 155 QT Interval:  462 QTC Calculation: 543 R Axis:   -69 Text Interpretation: Atrial fibrillation Left bundle branch block No significant change since last tracing Confirmed by Gareth Morgan [phone]) on 10/12/2021 10:00:47 PM  Radiology DG Chest 2 View  Result Date: 10/12/2021 CLINICAL DATA:  Cough. EXAM: CHEST - 2 VIEW COMPARISON:  10/05/2021 FINDINGS: Lungs are hyperexpanded. Interstitial markings are diffusely coarsened with chronic features. The cardio pericardial silhouette is enlarged. Subtle airspace disease at the left base could be atelectasis or pneumonia. No substantial pleural effusion. Bones are diffusely demineralized. IMPRESSION: Subtle airspace disease  at the left base could be atelectasis or pneumonia. Hyperexpansion suggests emphysema. Electronically Signed   By: Misty Stanley M.D.   On: 10/12/2021 12:57    Procedures Procedures    Medications Ordered in ED Medications - No data to display  ED Course/ Medical Decision Making/ A&P                             88 year old female with a history of dementia, diabetes, hypothyroidism, hypertension, hypercholesterolemia, atrial fibrillation on anticoagulation, recent treatment for UTI 2 weeks ago who presents with concern for cough.  CXR completed and personally evaluated by me and  radiology shows airspace opacity and left which may be atelectasis, however clinicaly most consistent with pneumonia.  Low suspicion for PE< no signs of pneumothorax, no significant wheezing.    Symptoms and CXR consistent with CAP.  Labs completed and personally evaluated by me show no leukocytosis, no significant change in Cr, normal CO2.  Patient discharged in stable condition with understanding of reasons to return.   No hypoxia, tachypnea, no fever, well appearing, no leukocytosis.   Feel she is appropriate for outpatient treatment of pneumonia. Patient discharged in stable condition with understanding of reasons to return.          Final Clinical Impression(s) / ED Diagnoses Final diagnoses:  Community acquired pneumonia of left lower lobe of lung    Rx / DC Orders ED Discharge Orders          Ordered    cefdinir (OMNICEF) 300 MG capsule  2 times daily        10/12/21 1433    azithromycin (ZITHROMAX) 250 MG tablet  Daily        10/12/21 1433              Gareth Morgan, MD 10/12/21 2238

## 2021-10-20 ENCOUNTER — Emergency Department (HOSPITAL_COMMUNITY)
Admission: EM | Admit: 2021-10-20 | Discharge: 2021-10-20 | Disposition: A | Discharge status: Skilled Nursing Facility | Payer: Medicare PPO | Attending: Emergency Medicine | Admitting: Emergency Medicine

## 2021-10-20 ENCOUNTER — Other Ambulatory Visit: Payer: Self-pay

## 2021-10-20 ENCOUNTER — Emergency Department (HOSPITAL_COMMUNITY): Payer: Medicare PPO

## 2021-10-20 DIAGNOSIS — Z853 Personal history of malignant neoplasm of breast: Secondary | ICD-10-CM | POA: Insufficient documentation

## 2021-10-20 DIAGNOSIS — R0602 Shortness of breath: Secondary | ICD-10-CM | POA: Insufficient documentation

## 2021-10-20 DIAGNOSIS — Z20822 Contact with and (suspected) exposure to covid-19: Secondary | ICD-10-CM | POA: Diagnosis not present

## 2021-10-20 DIAGNOSIS — E119 Type 2 diabetes mellitus without complications: Secondary | ICD-10-CM | POA: Insufficient documentation

## 2021-10-20 DIAGNOSIS — E039 Hypothyroidism, unspecified: Secondary | ICD-10-CM | POA: Insufficient documentation

## 2021-10-20 DIAGNOSIS — I10 Essential (primary) hypertension: Secondary | ICD-10-CM | POA: Insufficient documentation

## 2021-10-20 DIAGNOSIS — R062 Wheezing: Secondary | ICD-10-CM | POA: Insufficient documentation

## 2021-10-20 LAB — CBC WITH DIFFERENTIAL/PLATELET
Abs Immature Granulocytes: 0.04 10*3/uL (ref 0.00–0.07)
Basophils Absolute: 0.1 10*3/uL (ref 0.0–0.1)
Basophils Relative: 1 %
Eosinophils Absolute: 0.3 10*3/uL (ref 0.0–0.5)
Eosinophils Relative: 4 %
HCT: 37.1 % (ref 36.0–46.0)
Hemoglobin: 12.1 g/dL (ref 12.0–15.0)
Immature Granulocytes: 1 %
Lymphocytes Relative: 8 %
Lymphs Abs: 0.7 10*3/uL (ref 0.7–4.0)
MCH: 30.9 pg (ref 26.0–34.0)
MCHC: 32.6 g/dL (ref 30.0–36.0)
MCV: 94.6 fL (ref 80.0–100.0)
Monocytes Absolute: 0.6 10*3/uL (ref 0.1–1.0)
Monocytes Relative: 7 %
Neutro Abs: 6.9 10*3/uL (ref 1.7–7.7)
Neutrophils Relative %: 79 %
Platelets: 238 10*3/uL (ref 150–400)
RBC: 3.92 MIL/uL (ref 3.87–5.11)
RDW: 14.4 % (ref 11.5–15.5)
WBC: 8.6 10*3/uL (ref 4.0–10.5)
nRBC: 0 % (ref 0.0–0.2)

## 2021-10-20 LAB — BRAIN NATRIURETIC PEPTIDE: B Natriuretic Peptide: 585.9 pg/mL — ABNORMAL HIGH (ref 0.0–100.0)

## 2021-10-20 LAB — COMPREHENSIVE METABOLIC PANEL
ALT: 26 U/L (ref 0–44)
AST: 27 U/L (ref 15–41)
Albumin: 3.3 g/dL — ABNORMAL LOW (ref 3.5–5.0)
Alkaline Phosphatase: 57 U/L (ref 38–126)
Anion gap: 8 (ref 5–15)
BUN: 23 mg/dL (ref 8–23)
CO2: 25 mmol/L (ref 22–32)
Calcium: 8.7 mg/dL — ABNORMAL LOW (ref 8.9–10.3)
Chloride: 105 mmol/L (ref 98–111)
Creatinine, Ser: 1.5 mg/dL — ABNORMAL HIGH (ref 0.44–1.00)
GFR, Estimated: 34 mL/min — ABNORMAL LOW (ref >60)
Glucose, Bld: 162 mg/dL — ABNORMAL HIGH (ref 70–99)
Potassium: 5.1 mmol/L (ref 3.5–5.1)
Sodium: 138 mmol/L (ref 135–145)
Total Bilirubin: 1 mg/dL (ref 0.3–1.2)
Total Protein: 6.6 g/dL (ref 6.5–8.1)

## 2021-10-20 LAB — TROPONIN I (HIGH SENSITIVITY)
Troponin I (High Sensitivity): 13 ng/L (ref <18)
Troponin I (High Sensitivity): 13 ng/L (ref <18)

## 2021-10-20 LAB — SARS CORONAVIRUS 2 BY RT PCR: SARS Coronavirus 2 by RT PCR: NEGATIVE

## 2021-10-20 LAB — LIPASE, BLOOD: Lipase: 43 U/L (ref 11–51)

## 2021-10-20 MED ORDER — FUROSEMIDE 20 MG PO TABS: 20.0000 mg | ORAL_TABLET | Freq: Every morning | ORAL | 0 refills | Status: DC

## 2021-10-20 NOTE — ED Provider Notes (Signed)
Emergency Department Provider Note   I have reviewed the triage vital signs and the nursing notes.   HISTORY  Chief Complaint Shortness of Breath   HPI Diana [name] is a 88 y.o. female presents to the ED with SOB symptoms from her SNF today. Per EMS, patient with some wheezing on arrival and given a neb treatment and symptoms have drastically improved. Patient denies any CP. No active SOB at this time. No fever or chills. No abdominal pain or GI symptoms.    Past Medical History:  Diagnosis Date   Anemia    Anemia of decreased vitamin B12 absorption 05/26/2005   Arthritis    Atrial fibrillation ([place])    Breast cancer ([place]) 2001   Cataract    GERD (gastroesophageal reflux disease)    Hiatal hernia    History of tobacco abuse    Hx of colonic polyps    Hyperlipidemia    Hypertension    Hypothyroidism    Memory loss    Obesity    OSA on CPAP 09/2007   AHI 10.6/hr overall, 43.64/hr during REM, lost weight, does not use cpap   Osteopenia    Renal insufficiency    Type 2 diabetes mellitus ([place])     Review of Systems  Constitutional: No fever/chills Cardiovascular: Denies chest pain. Respiratory: Positive shortness of breath. Gastrointestinal: No abdominal pain.  No nausea, no vomiting.  No diarrhea.  No constipation. Musculoskeletal: Negative for back pain. Skin: Negative for rash. Neurological: Negative for headaches, focal weakness or numbness.  ____________________________________________   PHYSICAL EXAM:  VITAL SIGNS: ED Triage Vitals  Enc Vitals Group     BP 10/20/21 0815 (!) 176/93     Pulse Rate 10/20/21 0815 73     Resp 10/20/21 0815 (!) 22     Temp 10/20/21 0851 97.8 F (36.6 C)     Temp Source 10/20/21 0851 Oral     SpO2 10/20/21 0800 100 %   Constitutional: Alert. Well appearing and in no acute distress. Eyes: Conjunctivae are normal.  Head: Atraumatic. Nose: No congestion/rhinnorhea. Mouth/Throat: Mucous membranes are moist.    Neck: No stridor.   Cardiovascular: Normal rate, regular rhythm. Good peripheral circulation. Grossly normal heart sounds.   Respiratory: Normal respiratory effort.  No retractions. Lungs CTAB. Gastrointestinal: Soft and nontender. No distention.  Musculoskeletal: No lower extremity tenderness nor edema. No gross deformities of extremities. Neurologic:  Normal speech and language. No gross focal neurologic deficits are appreciated.  Skin:  Skin is warm, dry and intact. No rash noted.  ____________________________________________   LABS (all labs ordered are listed, but only abnormal results are displayed)  Labs Reviewed  COMPREHENSIVE METABOLIC PANEL - Abnormal; Notable for the following components:      Result Value   Glucose, Bld 162 (*)    Creatinine, Ser 1.50 (*)    Calcium 8.7 (*)    Albumin 3.3 (*)    GFR, Estimated 34 (*)    All other components within normal limits  BRAIN NATRIURETIC PEPTIDE - Abnormal; Notable for the following components:   B Natriuretic Peptide 585.9 (*)    All other components within normal limits  SARS CORONAVIRUS 2 BY RT PCR  CBC WITH DIFFERENTIAL/PLATELET  LIPASE, BLOOD  TROPONIN I (HIGH SENSITIVITY)  TROPONIN I (HIGH SENSITIVITY)   ____________________________________________  RADIOLOGY  DG Chest Portable 1 View  Result Date: 10/20/2021 CLINICAL DATA:  88 year old female with shortness of breath. EXAM: PORTABLE CHEST 1 VIEW COMPARISON:  Chest radiograph C5  23 and earlier. FINDINGS: Portable AP semi upright view at 0830 hours. Chronic cardiomegaly and moderate size gastric hiatal hernia as demonstrated on 2022 CT. Stable cardiac size and mediastinal contours. Visualized tracheal air column is within normal limits. Large underlying lung volumes. No pneumothorax, pulmonary edema, pleural effusion or confluent pulmonary opacity. Chronic right axillary surgical clips. Stable cholecystectomy clips. Negative visible bowel gas. No acute osseous  abnormality identified. IMPRESSION: No acute cardiopulmonary abnormality. Cardiomegaly and moderate sized chronic hiatal hernia. Electronically Signed   By: Genevie Ann M.D.   On: 10/20/2021 08:40    ____________________________________________   PROCEDURES  Procedure(s) performed:   Procedures  None ____________________________________________   INITIAL IMPRESSION / ASSESSMENT AND PLAN / ED COURSE  Pertinent labs & imaging results that were available during my care of the patient were reviewed by me and considered in my medical decision making (see chart for details).   This patient is Presenting for Evaluation of SOB, which does require a range of treatment options, and is a complaint that involves a high risk of morbidity and mortality.  The Differential Diagnoses include Asthma, CHF, CAP, COVID, Flu, bronchitis, COPD, etc.   I did obtain Additional Historical Information from EMS and Caregiver at bedside.  I decided to review pertinent External Data, and in summary ED evaluation on 10/12/21 for cough. .   Clinical Laboratory Tests Ordered, included troponin negative x 2. COVID negative. BNP mildly elevated. CKD without AKI. No anemia.   Radiologic Tests Ordered, included CXR. I independently interpreted the images and agree with radiology interpretation.   Cardiac Monitor Tracing which shows NSR.   Social Determinants of Health Risk patient is not an active smoker.   Medical Decision Making: Summary:  Patient presents to the ED with SOB prior to arrival apparently improved with neb treatment PTA. No wheezing here in the ED. Troponin negative x 2. Mild BNP elevation.   Reevaluation with update and discussion with patient and caregiver. Patient is on RA. Looking very comfortable. Plan for lasix x 5 days with close PCP follow up.   Considered admission but labs are reassuring and no increased WOB or hypoxemia. Plan for diuresis at home and close PCP follow up plan. Discussed  strict ED return precautions.   Disposition: discharge  ____________________________________________  FINAL CLINICAL IMPRESSION(S) / ED DIAGNOSES  Final diagnoses:  SOB (shortness of breath)   Note:  This document was prepared using Dragon voice recognition software and may include unintentional dictation errors.  Nanda Quinton, MD, [facility] Emergency Medicine    Beva Remund, [facility], MD 10/23/21 [phone]

## 2021-10-20 NOTE — Discharge Instructions (Addendum)
You were seen in the emergency room today with shortness of breath.  Your x-ray does not show any pneumonia and I am starting you on 5 more days of Lasix.  Please follow with your primary care physician and your cardiologist.  Return with any new or suddenly worsening symptoms.

## 2021-10-20 NOTE — ED Triage Notes (Signed)
BIB [facility] after staff at [facility] facility called to report pt having increase SHOB. Pt recently diagnosed with pneumonia and finished abx yesterday. Given 5 mg albuterol en route.   HX: CHF

## 2021-10-22 ENCOUNTER — Telehealth (HOSPITAL_BASED_OUTPATIENT_CLINIC_OR_DEPARTMENT_OTHER): Payer: Self-pay | Admitting: Cardiology

## 2021-10-22 NOTE — Telephone Encounter (Signed)
Pt c/o swelling: STAT is pt has developed SOB within 24 hours  If swelling, where is the swelling located?  Both legs  How much weight have you gained and in what time span?  No  Have you gained 3 pounds in a day or 5 pounds in a week? N/A  Do you have a log of your daily weights (if so, list)? No  Are you currently taking a fluid pill?  Yes  Are you currently SOB? Has been coming and going for a while  Have you traveled recently? No   Caller stated patient was recently put back on the furosemide (LASIX) 20 MG tablet by the ER doctor.  Caller stating she gets winded when walking to bathroom.  Caller stated patient has been coughing a lot and had a runny nose.  Patient was tested for COVID and tested negative.

## 2021-10-22 NOTE — Telephone Encounter (Signed)
Call to patient, spoke with Caregiver.  Patient's Caregiver states pt is no longer coughing and not short of breath.  States she is calling for a follow up appt as instructed by patient's PCP and ED physician. Appt scheduled with Dr. Harrell Gave for August 17th at 10:00.  Instructions given to call back if patients has worsening symptoms. Georgana Curio MHA RN CCM

## 2021-10-24 ENCOUNTER — Encounter (HOSPITAL_BASED_OUTPATIENT_CLINIC_OR_DEPARTMENT_OTHER): Payer: Self-pay | Admitting: Cardiology

## 2021-10-24 ENCOUNTER — Ambulatory Visit (HOSPITAL_BASED_OUTPATIENT_CLINIC_OR_DEPARTMENT_OTHER): Payer: Medicare PPO | Admitting: Cardiology

## 2021-10-24 VITALS — BP 100/59 | HR 55 | Ht 68.0 in | Wt 183.5 lb

## 2021-10-24 DIAGNOSIS — E119 Type 2 diabetes mellitus without complications: Secondary | ICD-10-CM

## 2021-10-24 DIAGNOSIS — R001 Bradycardia, unspecified: Secondary | ICD-10-CM

## 2021-10-24 DIAGNOSIS — D6869 Other thrombophilia: Secondary | ICD-10-CM | POA: Diagnosis not present

## 2021-10-24 DIAGNOSIS — I5032 Chronic diastolic (congestive) heart failure: Secondary | ICD-10-CM

## 2021-10-24 DIAGNOSIS — I4821 Permanent atrial fibrillation: Secondary | ICD-10-CM

## 2021-10-24 DIAGNOSIS — Z794 Long term (current) use of insulin: Secondary | ICD-10-CM

## 2021-10-24 DIAGNOSIS — T50905A Adverse effect of unspecified drugs, medicaments and biological substances, initial encounter: Secondary | ICD-10-CM

## 2021-10-24 MED ORDER — FUROSEMIDE 20 MG PO TABS: 20.0000 mg | ORAL_TABLET | Freq: Every day | ORAL | 3 refills | Status: DC | PRN

## 2021-10-24 MED ORDER — METOPROLOL SUCCINATE ER 25 MG PO TB24: 12.5000 mg | ORAL_TABLET | Freq: Every day | ORAL | 3 refills | Status: DC

## 2021-10-24 NOTE — Progress Notes (Signed)
Cardiology Office Note:    Date:  10/24/2021   ID:  Diana [name], DOB Aug 01, 1933, MRN [id number]  PCP:  Percell Belt, DO  Cardiologist:  Buford Dresser, MD  Referring MD: Percell Belt, DO   CC: follow up  History of Present Illness:    Diana [name] is a 88 y.o. female with a hx of hypertension, type II diabetes, hyperlipidemia, atrial fibrillation who is seen for follow-up. She was initially seen 03/16/20 as a new consult at the request of Lazoff, Shawn P, DO for the evaluation and management of atrial fibrillation. She was seen in the office by Dr. Claiborne Billings in 11/2016.  At her last appointment, her caretaker reported she had been falling with no one available. She was often found on the floor. Her last fall occurred 1 week prior and she was sent to the ER because she hit the back of her head. She did have a small fracture on her L ankle that was able to heal quickly. She was taken off of Eliquis due to the frequent falls. Getting up and walking around caused her to feel dizzy and winded.   Today: She is accompanied by her caretaker who acts as her historian. At times her confusion seems to be worse than usual.  Previously while in the ED she had breathing issues and sputum production. At this time, her breathing seems to be okay. However, in the past few days her coughing has resumed, and is unproductive. On exertion she will develop some rhinorrhea. Of note, she is taking pantoprazole and belches frequently.  In the past month or two, she has had 2-3 falls.Prior to that she hadn't fallen for a while.  She has had random episodes of pallor, diaphoresis, and emesis about 3-4 times over the past 2 years.  In clinic today her blood pressure is 100/59. It was noted to be 113/65 at another clinic visit on 10/15/21.  When she does take furosemide this seems to improve her LE edema.  Lately she has had a significantly decreased appetite.  Also she has been struggling  with recurrent UTI's.  She has never used a CPAP.  She denies any palpitations, chest pain, or shortness of breath. No lightheadedness, headaches, syncope, orthopnea, or PND.   Past Medical History:  Diagnosis Date   Anemia    Anemia of decreased vitamin B12 absorption 05/26/2005   Arthritis    Atrial fibrillation ([place])    Breast cancer ([place]) 2001   Cataract    GERD (gastroesophageal reflux disease)    Hiatal hernia    History of tobacco abuse    Hx of colonic polyps    Hyperlipidemia    Hypertension    Hypothyroidism    Memory loss    Obesity    OSA on CPAP 09/2007   AHI 10.6/hr overall, 43.64/hr during REM, lost weight, does not use cpap   Osteopenia    Renal insufficiency    Type 2 diabetes mellitus [facility])     Past Surgical History:  Procedure Laterality Date   BACK SURGERY  05/30/2009   BIOPSY  01/01/2019   Procedure: BIOPSY;  Surgeon: Milus Banister, MD;  Location: [facility];  Service: Endoscopy;;   BOWEL RESECTION  1974   CATARACT EXTRACTION Bilateral    bilateral   CHOLECYSTECTOMY  1974   COLONOSCOPY     ESOPHAGOGASTRODUODENOSCOPY N/A 01/01/2019   Procedure: ESOPHAGOGASTRODUODENOSCOPY (EGD);  Surgeon: Milus Banister, MD;  Location[facility] ENDOSCOPY;  Service: Endoscopy;  Laterality: N/A;   EXTERNAL FIXATION REMOVAL Left 04/27/2020   Procedure: REMOVAL EXTERNAL FIXATION ARM;  Surgeon: Shona Needles, MD;  Location: [facility];  Service: Orthopedics;  Laterality: Left;   MASTECTOMY Bilateral 2001   bilateral sentinel lymph nodes bio   NM MYOCAR PERF WALL MOTION  06/2007   dipyridamole; perfusion defect in inferior myocardium consistent with diaphragmatic attenuation, remaining myocardium with no ischemia/infarct, EF 73%; normal, low risk scan    ORIF ELBOW FRACTURE Left 03/21/2020   Procedure: OPEN REDUCTION INTERNAL FIXATION (ORIF) ELBOW/OLECRANON FRACTURE;  Surgeon: Shona Needles, MD;  Location: [facility];  Service: Orthopedics;  Laterality: Left;   TOTAL ABDOMINAL  HYSTERECTOMY  1975   TRANSTHORACIC ECHOCARDIOGRAM  02/2010   VV=>61%, stage 1 diastolic dysfunction; borderline RV enlargement; LA mild-mod dilated; mild mitral annular calcif & mild MR; mild TR with normal RSVP, AV moderately sclerotics    Current Medications: Current Outpatient Medications on File Prior to Visit  Medication Sig   amLODipine (NORVASC) 5 MG tablet Take 5 mg by mouth daily.   brimonidine-timolol (COMBIGAN) 0.2-0.5 % ophthalmic solution Place 1 drop into both eyes every 12 (twelve) hours.   ferrous sulfate 325 (65 FE) MG EC tablet Take 325 mg by mouth daily.   FLUoxetine (PROZAC) 20 MG capsule Take 1 capsule (20 mg total) by mouth daily.   insulin glargine (LANTUS) 100 UNIT/ML Solostar Pen Inject 10 Units into the skin at bedtime.   levothyroxine (SYNTHROID) 50 MCG tablet Take 1 tablet (50 mcg total) by mouth daily.   losartan (COZAAR) 50 MG tablet Take 1 tablet (50 mg total) by mouth 2 (two) times daily.   metFORMIN (GLUCOPHAGE) 500 MG tablet Take 500 mg by mouth 2 (two) times daily with a meal.   Multiple Vitamins-Minerals (PRESERVISION AREDS 2 PO) Take 1 capsule by mouth daily.   pantoprazole (PROTONIX) 40 MG tablet Take 1 tablet (40 mg total) by mouth 2 (two) times daily before a meal.   rosuvastatin (CRESTOR) 10 MG tablet Take 10 mg by mouth at bedtime.   No current facility-administered medications on file prior to visit.     Allergies:   Codeine sulfate, Sps [sodium polystyrene sulfonate], and Sulfa antibiotics   Social History   Tobacco Use   Smoking status: Former    Years: 13.00    Types: Cigarettes    Quit date: 05/02/2002    Years since quitting: 19.4   Smokeless tobacco: Never  Vaping Use   Vaping Use: Never used  Substance Use Topics   Alcohol use: No    Alcohol/week: 0.0 standard drinks of alcohol   Drug use: Never    Family History: family history includes Heart attack in her father and mother. There is no history of Colon cancer.  ROS:    Please see the history of present illness. (+) Non-productive cough (+) Rhinorrhea (+) Falls (+) Frequent belching All other systems are reviewed and negative.    EKGs/Labs/Other Studies Reviewed:    The following studies were reviewed today:  Echo 08/26/20  1. Left ventricular ejection fraction, by estimation, is 70 to 75%. The  left ventricle has hyperdynamic function. The left ventricle has no  regional wall motion abnormalities. There is mild left ventricular  hypertrophy. Left ventricular diastolic  function could not be evaluated.   2. Right ventricular systolic function is mildly reduced. The right  ventricular size is normal. There is mildly elevated pulmonary artery  systolic pressure.   3. Left atrial size was severely  dilated.   4. Right atrial size was mildly dilated.   5. The mitral valve is abnormal. Trivial mitral valve regurgitation.   6. The aortic valve is tricuspid. Mild sclerosis. Aortic valve  regurgitation is not visualized. Aortic valve mean gradient measures 8.0  mmHg, DI is 0.50   7. The inferior vena cava is normal in size with greater than 50%  respiratory variability, suggesting right atrial pressure of 3 mmHg.   Comparison(s): Changes from prior study are noted. 12/28/2018: LVEF  60-65%, no aortic stenosis.  CT Chest  06/30/2020: FINDINGS: Cardiovascular: Heart size is mildly enlarged. No pericardial effusion. Thoracic aorta is nonaneurysmal. Atherosclerotic calcifications of the aorta and coronary arteries. Main pulmonary trunk measures 3.4 cm in diameter.   Mediastinum/Nodes: No axillary, mediastinal, or hilar lymphadenopathy. Surgical clips present in the bilateral axillary regions. Trachea within normal limits. Moderate-sized hiatal hernia. Esophagus unremarkable.   Lungs/Pleura: Trace right pleural effusion with mild bibasilar dependent atelectasis. Mild bronchial wall thickening. Lungs are otherwise clear. No pneumothorax.   Upper  Abdomen: No acute abnormality.   Musculoskeletal: No acute osseous findings. Thoracic vertebral body heights are maintained. Mild scoliotic curvature. Multilevel degenerative disc disease. Incidental T11 vertebral body hemangioma. Advanced bilateral glenohumeral arthropathy. Bilateral mastectomies. No chest wall lesion.   IMPRESSION: 1. Trace right pleural effusion with mild bibasilar dependent atelectasis. 2. Mild cardiomegaly. 3. Moderate-sized hiatal hernia. 4. Mildly dilated main pulmonary trunk, which can be seen with pulmonary arterial hypertension. 5. Aortic and coronary artery atherosclerosis (ICD10-I70.0).    Carotid US 01/01/19 Summary:  Right Carotid: Velocities in the right ICA are consistent with a 1-39%  stenosis.   Left Carotid: Velocities in the left ICA are consistent with a 1-39%  stenosis.   Vertebrals: Bilateral vertebral arteries demonstrate antegrade flow.  Echo 12/31/18 1. Left ventricular ejection fraction, by visual estimation, is 60 to  65%. The left ventricle has normal function. Normal left ventricular size.  There is no left ventricular hypertrophy.   2. Left ventricular diastolic function could not be evaluated pattern of  LV diastolic filling.   3. Global right ventricle has normal systolic function.The right  ventricular size is not well visualized. No increase in right ventricular  wall thickness.   4. Left atrial size was normal.   5. Right atrial size was not well visualized.   6. Moderate mitral annular calcification.   7. The mitral valve was not well visualized. Trace mitral valve  regurgitation. No evidence of mitral stenosis.   8. The tricuspid valve is normal in structure. Tricuspid valve  regurgitation was not visualized by color flow Doppler.   9. The aortic valve is normal in structure. Aortic valve regurgitation  was not visualized by color flow Doppler. Structurally normal aortic  valve, with no evidence of sclerosis or  stenosis.  10. The pulmonic valve was not well visualized. Pulmonic valve  regurgitation is not visualized by color flow Doppler.  11. TR signal is inadequate for assessing pulmonary artery systolic  pressure.   EKG:  EKG is personally reviewed.   10/24/2021:  atrial fibrillation at 55 bpm, LBBB 05/14/21: atrial fibrillation at 65 bpm 03/16/20: atrial fibrillation at 75 bpm  Recent Labs: 10/20/2021: ALT 26; B Natriuretic Peptide 585.9; BUN 23; Creatinine, Ser 1.50; Hemoglobin 12.1; Platelets 238; Potassium 5.1; Sodium 138   Recent Lipid Panel    Component Value Date/Time   CHOL 85 04/02/2020 0000   TRIG 58 04/02/2020 0000   HDL 35 04/02/2020 0000   CHOLHDL 2.7  07/05/2009 1005   VLDL 22 07/05/2009 1005   LDLCALC 38 04/02/2020 0000    Physical Exam:    VS:  BP (!) 100/59 (BP Location: Right Arm, Patient Position: Sitting, Cuff Size: Large)   Pulse (!) 55   Ht '5\' 8"'$  (1.727 m)   Wt 183 lb 8 oz (83.2 kg)   LMP  (LMP Unknown)   BMI 27.90 kg/m     Wt Readings from Last 3 Encounters:  10/24/21 183 lb 8 oz (83.2 kg)  10/05/21 194 lb 0.1 oz (88 kg)  09/12/21 194 lb (88 kg)    GEN: Well nourished, well developed in no acute distress HEENT: Normal, moist mucous membranes NECK: No JVD CARDIAC: irregularly irregular rhythm, normal S1 and S2, no rubs or gallops. 2/6 systolic murmur. VASCULAR: Radial pulses 2+ bilaterally. RESPIRATORY:  Clear to auscultation without rales, wheezing or rhonchi  ABDOMEN: Soft, non-tender, non-distended MUSCULOSKELETAL: In wheelchair SKIN: Warm and dry, no significant LE edema  NEUROLOGIC:  Alert and oriented x 3. No focal neuro deficits noted. PSYCHIATRIC:  Normal affect    ASSESSMENT:    1. Permanent atrial fibrillation ([place])   2. Chronic diastolic heart failure ([place])   3. Drug-induced sinus bradycardia   4. Secondary hypercoagulable state ([place])   5. Type 2 diabetes mellitus without complication, with long-term current use of insulin ([facility])     PLAN:    Atrial fibrillation, permanent -she is completely asymptomatic -aim for rate control with metoprolol, bradycardia is drug induced and remains 50s-60s. Changing to long acting metoprolol succinate today -normal EF on echo -was on apixaban. See prior discussion. Very difficult situation. High risk of stroke given score, below, but high risk of life threatening injury/bleed with fall. Previously discussed with family, will not pursue anticoagulation per preference -CHA2DS2/VAS Stroke Risk Points= 6   LE edema: on short term lasix, improved today. Will order as needed lasix for edema, weight gain, shortness of breath  Hypertension: -on losartan, metoprolol, amlodipine -well controlled in the office. Elevated at recent ER visit but has historically been well controlled  Dyslipidemia Type II diabetes -last A1c 8.4 per [facility] -on rosuvastatin -was not on aspirin due to anticoagulation, with high fall risk will not add aspirin at this time -on metformin and insulin  Plan for follow up: 3 months or sooner PRN  Buford Dresser, MD, PhD, [facility]    Medication Adjustments/Labs and Tests Ordered: Current medicines are reviewed at length with the patient today.  Concerns regarding medicines are outlined above.   Orders Placed This Encounter  Procedures   EKG 12-Lead   Meds ordered this encounter  Medications   metoprolol succinate (TOPROL-XL) 25 MG 24 hr tablet    Sig: Take 0.5 tablets (12.5 mg total) by mouth daily. Take with or immediately following a meal.    Dispense:  45 tablet    Refill:  3    Replaces metoprolol tartrate   furosemide (LASIX) 20 MG tablet    Sig: Take 1 tablet (20 mg total) by mouth daily as needed (shortness of breath, take for 5 days).    Dispense:  90 tablet    Refill:  3   Patient Instructions  Medication Instructions:  STOP: Metoprolol tartrate 12.5 mg twice a day START: Metoprolol Succinate 12.5 mg daily START:  Lasix 20 mg daily as needed for shortness of breath  *If you need a refill on your cardiac medications before your next appointment, please call your pharmacy*  Lab Work: None ordered today   Testing/Procedures: None ordered today    Follow-Up: At [facility], you and your health needs are our priority.  As part of our continuing mission to provide you with exceptional heart care, we have created designated Provider Care Teams.  These Care Teams include your primary Cardiologist (physician) and Advanced Practice Providers (APPs -  Physician Assistants and Nurse Practitioners) who all work together to provide you with the care you need, when you need it.  We recommend signing up for the patient portal called "MyChart".  Sign up information is provided on this After Visit Summary.  MyChart is used to connect with patients for Virtual Visits (Telemedicine).  Patients are able to view lab/test results, encounter notes, upcoming appointments, etc.  Non-urgent messages can be sent to your provider as well.   To learn more about what you can do with MyChart, go to [email or web address].    Your next appointment:   3 month(s)  The format for your next appointment:   In Person  Provider:   Buford Dresser, MD{         I,Mathew Stumpf,acting as a scribe for Buford Dresser, MD.,have documented all relevant documentation on the behalf of Buford Dresser, MD,as directed by  Buford Dresser, MD while in the presence of Buford Dresser, MD.  I, Madelin Rear, have reviewed all documentation for this visit. The documentation on 10/24/21 for the exam, diagnosis, procedures, and orders are all accurate and complete.   Signed, Buford Dresser, MD PhD 10/24/2021 10:43 AM    [place]

## 2021-10-24 NOTE — Patient Instructions (Addendum)
Medication Instructions:  STOP: Metoprolol tartrate 12.5 mg twice a day START: Metoprolol Succinate 12.5 mg daily START: Lasix 20 mg daily as needed for shortness of breath  *If you need a refill on your cardiac medications before your next appointment, please call your pharmacy*   Lab Work: None ordered today   Testing/Procedures: None ordered today    Follow-Up: At [facility], you and your health needs are our priority.  As part of our continuing mission to provide you with exceptional heart care, we have created designated Provider Care Teams.  These Care Teams include your primary Cardiologist (physician) and Advanced Practice Providers (APPs -  Physician Assistants and Nurse Practitioners) who all work together to provide you with the care you need, when you need it.  We recommend signing up for the patient portal called "MyChart".  Sign up information is provided on this After Visit Summary.  MyChart is used to connect with patients for Virtual Visits (Telemedicine).  Patients are able to view lab/test results, encounter notes, upcoming appointments, etc.  Non-urgent messages can be sent to your provider as well.   To learn more about what you can do with MyChart, go to [email or web address].    Your next appointment:   3 month(s)  The format for your next appointment:   In Person  Provider:   Buford Dresser, MD{

## 2021-11-15 ENCOUNTER — Emergency Department (HOSPITAL_BASED_OUTPATIENT_CLINIC_OR_DEPARTMENT_OTHER)
Admission: EM | Admit: 2021-11-15 | Discharge: 2021-11-16 | Disposition: A | Discharge status: Home / Self Care | Payer: Medicare PPO | Attending: Emergency Medicine | Admitting: Emergency Medicine

## 2021-11-15 ENCOUNTER — Encounter (HOSPITAL_BASED_OUTPATIENT_CLINIC_OR_DEPARTMENT_OTHER): Payer: Self-pay

## 2021-11-15 DIAGNOSIS — Z7984 Long term (current) use of oral hypoglycemic drugs: Secondary | ICD-10-CM | POA: Diagnosis not present

## 2021-11-15 DIAGNOSIS — N390 Urinary tract infection, site not specified: Secondary | ICD-10-CM | POA: Diagnosis not present

## 2021-11-15 DIAGNOSIS — Z79899 Other long term (current) drug therapy: Secondary | ICD-10-CM | POA: Insufficient documentation

## 2021-11-15 DIAGNOSIS — F0390 Unspecified dementia without behavioral disturbance: Secondary | ICD-10-CM | POA: Diagnosis not present

## 2021-11-15 DIAGNOSIS — I10 Essential (primary) hypertension: Secondary | ICD-10-CM | POA: Insufficient documentation

## 2021-11-15 DIAGNOSIS — N3000 Acute cystitis without hematuria: Secondary | ICD-10-CM

## 2021-11-15 DIAGNOSIS — Z794 Long term (current) use of insulin: Secondary | ICD-10-CM | POA: Diagnosis not present

## 2021-11-15 DIAGNOSIS — R300 Dysuria: Secondary | ICD-10-CM | POA: Diagnosis present

## 2021-11-15 NOTE — ED Triage Notes (Signed)
Pt to ED from home with caregiver. Caregiver reports pt had burning and frequent urination. Caregiver also reports urine appears cloudy. Hx of dementia.

## 2021-11-16 ENCOUNTER — Emergency Department (HOSPITAL_BASED_OUTPATIENT_CLINIC_OR_DEPARTMENT_OTHER): Payer: Medicare PPO

## 2021-11-16 LAB — CBC WITH DIFFERENTIAL/PLATELET
Abs Immature Granulocytes: 0.01 10*3/uL (ref 0.00–0.07)
Basophils Absolute: 0 10*3/uL (ref 0.0–0.1)
Basophils Relative: 1 %
Eosinophils Absolute: 0.2 10*3/uL (ref 0.0–0.5)
Eosinophils Relative: 4 %
HCT: 36.2 % (ref 36.0–46.0)
Hemoglobin: 11.6 g/dL — ABNORMAL LOW (ref 12.0–15.0)
Immature Granulocytes: 0 %
Lymphocytes Relative: 11 %
Lymphs Abs: 0.7 10*3/uL (ref 0.7–4.0)
MCH: 30.1 pg (ref 26.0–34.0)
MCHC: 32 g/dL (ref 30.0–36.0)
MCV: 93.8 fL (ref 80.0–100.0)
Monocytes Absolute: 0.9 10*3/uL (ref 0.1–1.0)
Monocytes Relative: 14 %
Neutro Abs: 4.3 10*3/uL (ref 1.7–7.7)
Neutrophils Relative %: 70 %
Platelets: 217 10*3/uL (ref 150–400)
RBC: 3.86 MIL/uL — ABNORMAL LOW (ref 3.87–5.11)
RDW: 14.8 % (ref 11.5–15.5)
WBC: 6.2 10*3/uL (ref 4.0–10.5)
nRBC: 0 % (ref 0.0–0.2)

## 2021-11-16 LAB — BASIC METABOLIC PANEL
Anion gap: 9 (ref 5–15)
BUN: 24 mg/dL — ABNORMAL HIGH (ref 8–23)
CO2: 27 mmol/L (ref 22–32)
Calcium: 9.4 mg/dL (ref 8.9–10.3)
Chloride: 103 mmol/L (ref 98–111)
Creatinine, Ser: 1.59 mg/dL — ABNORMAL HIGH (ref 0.44–1.00)
GFR, Estimated: 31 mL/min — ABNORMAL LOW (ref >60)
Glucose, Bld: 189 mg/dL — ABNORMAL HIGH (ref 70–99)
Potassium: 4.2 mmol/L (ref 3.5–5.1)
Sodium: 139 mmol/L (ref 135–145)

## 2021-11-16 LAB — URINALYSIS, ROUTINE W REFLEX MICROSCOPIC
Bilirubin Urine: NEGATIVE
Glucose, UA: NEGATIVE mg/dL
Ketones, ur: NEGATIVE mg/dL
Nitrite: NEGATIVE
Protein, ur: >300 mg/dL — AB
Specific Gravity, Urine: 1.015 (ref 1.005–1.030)
WBC, UA: >50 WBC/hpf — ABNORMAL HIGH (ref 0–5)
pH: 6 (ref 5.0–8.0)

## 2021-11-16 MED ORDER — CEPHALEXIN 500 MG PO CAPS: 500.0000 mg | ORAL_CAPSULE | Freq: Three times a day (TID) | ORAL | 0 refills | Status: DC

## 2021-11-16 MED ORDER — SODIUM CHLORIDE 0.9 % IV BOLUS
1000.0000 mL | Freq: Once | INTRAVENOUS | Status: AC
Start: 2021-11-16 — End: 2021-11-16
  Administered 2021-11-16: 1000 mL via INTRAVENOUS

## 2021-11-16 MED ORDER — SODIUM CHLORIDE 0.9 % IV SOLN
1.0000 g | Freq: Once | INTRAVENOUS | Status: AC
  Administered 2021-11-16: 1 g via INTRAVENOUS
  Filled 2021-11-16: qty 10

## 2021-11-16 NOTE — ED Provider Notes (Signed)
[facility] EMERGENCY DEPT Provider Note   CSN: [id number] Arrival date & time: 11/15/21  2149     History  Chief Complaint  Patient presents with   Dysuria    Diana [name] is a 88 y.o. female.  Patient is an 88 year old female with past medical history of dementia, hypertension, obstructive sleep apnea, recurrent UTIs.  Patient presenting today with complaints of dysuria and cloudy urine.  This has been worsening over the past 2 days.  She is brought here by her caregiver for this.  Patient denies to me she is experiencing any pain or other symptoms.  The history is provided by the patient.       Home Medications Prior to Admission medications   Medication Sig Start Date End Date Taking? Authorizing Provider  amLODipine (NORVASC) 5 MG tablet Take 5 mg by mouth daily.    [provider]  brimonidine-timolol (COMBIGAN) 0.2-0.5 % ophthalmic solution Place 1 drop into both eyes every 12 (twelve) hours. 05/26/20   Medina-Vargas, Monina C, NP  ferrous sulfate 325 (65 FE) MG EC tablet Take 325 mg by mouth daily.    [provider]  FLUoxetine (PROZAC) 20 MG capsule Take 1 capsule (20 mg total) by mouth daily. 05/26/20   Medina-Vargas, Monina C, NP  furosemide (LASIX) 20 MG tablet Take 1 tablet (20 mg total) by mouth daily as needed (shortness of breath, take for 5 days). 10/24/21 10/19/22  Buford Dresser, MD  insulin glargine (LANTUS) 100 UNIT/ML Solostar Pen Inject 10 Units into the skin at bedtime.    [provider]  levothyroxine (SYNTHROID) 50 MCG tablet Take 1 tablet (50 mcg total) by mouth daily. 05/26/20   Medina-Vargas, Monina C, NP  losartan (COZAAR) 50 MG tablet Take 1 tablet (50 mg total) by mouth 2 (two) times daily. 05/26/20   Medina-Vargas, Monina C, NP  metFORMIN (GLUCOPHAGE) 500 MG tablet Take 500 mg by mouth 2 (two) times daily with a meal.    [provider]  metoprolol succinate (TOPROL-XL) 25 MG 24 hr tablet  Take 0.5 tablets (12.5 mg total) by mouth daily. Take with or immediately following a meal. 10/24/21 10/19/22  Buford Dresser, MD  Multiple Vitamins-Minerals (PRESERVISION AREDS 2 PO) Take 1 capsule by mouth daily.    [provider]  pantoprazole (PROTONIX) 40 MG tablet Take 1 tablet (40 mg total) by mouth 2 (two) times daily before a meal. 05/26/20   Medina-Vargas, Monina C, NP  rosuvastatin (CRESTOR) 10 MG tablet Take 10 mg by mouth at bedtime.    [provider]      Allergies    Codeine sulfate, Sps [sodium polystyrene sulfonate], and Sulfa antibiotics    Review of Systems   Review of Systems  Unable to perform ROS: Dementia    Physical Exam Updated Vital Signs BP 136/73 (BP Location: Right Arm)   Pulse (!) 57   Temp 97.9 F (36.6 C) (Oral)   Resp 20   LMP  (LMP Unknown)   SpO2 96%  Physical Exam Vitals and nursing note reviewed.  Constitutional:      General: She is not in acute distress.    Appearance: She is well-developed. She is not diaphoretic.  HENT:     Head: Normocephalic and atraumatic.  Cardiovascular:     Rate and Rhythm: Normal rate and regular rhythm.     Heart sounds: No murmur heard.    No friction rub. No gallop.  Pulmonary:     Effort: Pulmonary  effort is normal. No respiratory distress.     Breath sounds: Normal breath sounds. No wheezing.  Abdominal:     General: Bowel sounds are normal. There is no distension.     Palpations: Abdomen is soft.     Tenderness: There is no abdominal tenderness.  Musculoskeletal:        General: Normal range of motion.     Cervical back: Normal range of motion and neck supple.  Skin:    General: Skin is warm and dry.  Neurological:     General: No focal deficit present.     Mental Status: She is alert and oriented to person, place, and time.     ED Results / Procedures / Treatments   Labs (all labs ordered are listed, but only abnormal results are displayed) Labs Reviewed   URINALYSIS, ROUTINE W REFLEX MICROSCOPIC - Abnormal; Notable for the following components:      Result Value   APPearance CLOUDY (*)    Hgb urine dipstick MODERATE (*)    Protein, ur >300 (*)    Leukocytes,Ua LARGE (*)    WBC, UA >50 (*)    Bacteria, UA MANY (*)    Non Squamous Epithelial 0-5 (*)    All other components within normal limits  BASIC METABOLIC PANEL - Abnormal; Notable for the following components:   Glucose, Bld 189 (*)    BUN 24 (*)    Creatinine, Ser 1.59 (*)    GFR, Estimated 31 (*)    All other components within normal limits  CBC WITH DIFFERENTIAL/PLATELET - Abnormal; Notable for the following components:   RBC 3.86 (*)    Hemoglobin 11.6 (*)    All other components within normal limits    EKG None  Radiology DG Chest Port 1 View  Result Date: 11/16/2021 CLINICAL DATA:  Cough. EXAM: PORTABLE CHEST 1 VIEW COMPARISON:  Chest radiograph dated 10/20/2021. FINDINGS: There is cardiomegaly with vascular congestion. No focal consolidation, pleural effusion, or pneumothorax. Stable hiatal hernia. Atherosclerotic calcification of the aorta. Osteopenia with degenerative changes of the spine. No acute osseous pathology. IMPRESSION: Cardiomegaly with vascular congestion. Electronically Signed   By: Anner Crete M.D.   On: 11/16/2021 01:23    Procedures Procedures    Medications Ordered in ED Medications  sodium chloride 0.9 % bolus 1,000 mL (0 mLs Intravenous Stopped 11/16/21 0219)  cefTRIAXone (ROCEPHIN) 1 g in sodium chloride 0.9 % 100 mL IVPB (0 g Intravenous Stopped 11/16/21 0248)    ED Course/ Medical Decision Making/ A&P  Patient presenting here with complaints of cloudy urine and burning with urination.  Urinalysis is consistent with a UTI.  Patient given IV Rocephin and seems appropriate for discharge.  She has no white count, no fever, and is nontoxic in appearance.  Caretaker also reports cough recently, but x-ray shows no pneumonia or infiltrate.  It  does have chronic findings of CHF for which I do not feel additional treatment is indicated.  Final Clinical Impression(s) / ED Diagnoses Final diagnoses:  None    Rx / DC Orders ED Discharge Orders     None         Veryl Speak, MD 11/16/21 [phone]

## 2021-11-16 NOTE — Discharge Instructions (Signed)
Begin taking Keflex as prescribed.  Continue other medications as previously prescribed.  Return to the ER if symptoms significantly worsen or change. 

## 2021-11-26 ENCOUNTER — Encounter (HOSPITAL_BASED_OUTPATIENT_CLINIC_OR_DEPARTMENT_OTHER): Payer: Self-pay | Admitting: Cardiology

## 2021-12-10 ENCOUNTER — Telehealth (HOSPITAL_BASED_OUTPATIENT_CLINIC_OR_DEPARTMENT_OTHER): Payer: Self-pay | Admitting: Cardiology

## 2021-12-10 DIAGNOSIS — I4821 Permanent atrial fibrillation: Secondary | ICD-10-CM

## 2021-12-10 MED ORDER — METOPROLOL SUCCINATE ER 25 MG PO TB24: 12.5000 mg | ORAL_TABLET | Freq: Every day | ORAL | 3 refills | Status: DC

## 2021-12-10 NOTE — Telephone Encounter (Signed)
*  STAT* If patient is at the pharmacy, call can be transferred to refill team.   1. Which medications need to be refilled? (please list name of each medication and dose if known)   metoprolol succinate (TOPROL-XL) 25 MG 24 hr tablet  2. Which pharmacy/location (including street and city if local pharmacy) is medication to be sent to?  [place], [place] - [address]  3. Do they need a 30 day or 90 day supply? 90 day  Caller stated patient still has some of this medication.    Caller noted patient will be using [facility] as her pharmacy going forward.

## 2021-12-10 NOTE — Telephone Encounter (Signed)
Rx request sent to pharmacy.  

## 2021-12-16 ENCOUNTER — Emergency Department (HOSPITAL_COMMUNITY): Payer: Medicare PPO

## 2021-12-16 ENCOUNTER — Emergency Department (HOSPITAL_COMMUNITY)
Admission: EM | Admit: 2021-12-16 | Discharge: 2021-12-16 | Disposition: A | Discharge status: Home / Self Care | Payer: Medicare PPO | Attending: Emergency Medicine | Admitting: Emergency Medicine

## 2021-12-16 ENCOUNTER — Encounter (HOSPITAL_COMMUNITY): Payer: Self-pay | Admitting: Emergency Medicine

## 2021-12-16 DIAGNOSIS — E11622 Type 2 diabetes mellitus with other skin ulcer: Secondary | ICD-10-CM | POA: Diagnosis not present

## 2021-12-16 DIAGNOSIS — Z79899 Other long term (current) drug therapy: Secondary | ICD-10-CM | POA: Diagnosis not present

## 2021-12-16 DIAGNOSIS — N3001 Acute cystitis with hematuria: Secondary | ICD-10-CM

## 2021-12-16 DIAGNOSIS — L97919 Non-pressure chronic ulcer of unspecified part of right lower leg with unspecified severity: Secondary | ICD-10-CM | POA: Insufficient documentation

## 2021-12-16 DIAGNOSIS — R944 Abnormal results of kidney function studies: Secondary | ICD-10-CM | POA: Diagnosis not present

## 2021-12-16 DIAGNOSIS — Z7984 Long term (current) use of oral hypoglycemic drugs: Secondary | ICD-10-CM | POA: Diagnosis not present

## 2021-12-16 DIAGNOSIS — K5732 Diverticulitis of large intestine without perforation or abscess without bleeding: Secondary | ICD-10-CM | POA: Diagnosis not present

## 2021-12-16 DIAGNOSIS — F0390 Unspecified dementia without behavioral disturbance: Secondary | ICD-10-CM | POA: Diagnosis not present

## 2021-12-16 DIAGNOSIS — R109 Unspecified abdominal pain: Secondary | ICD-10-CM | POA: Diagnosis present

## 2021-12-16 DIAGNOSIS — I10 Essential (primary) hypertension: Secondary | ICD-10-CM | POA: Diagnosis not present

## 2021-12-16 DIAGNOSIS — Z794 Long term (current) use of insulin: Secondary | ICD-10-CM | POA: Diagnosis not present

## 2021-12-16 DIAGNOSIS — K5792 Diverticulitis of intestine, part unspecified, without perforation or abscess without bleeding: Secondary | ICD-10-CM

## 2021-12-16 LAB — COMPREHENSIVE METABOLIC PANEL
ALT: 17 U/L (ref 0–44)
AST: 21 U/L (ref 15–41)
Albumin: 3.4 g/dL — ABNORMAL LOW (ref 3.5–5.0)
Alkaline Phosphatase: 60 U/L (ref 38–126)
Anion gap: 12 (ref 5–15)
BUN: 29 mg/dL — ABNORMAL HIGH (ref 8–23)
CO2: 24 mmol/L (ref 22–32)
Calcium: 8.6 mg/dL — ABNORMAL LOW (ref 8.9–10.3)
Chloride: 103 mmol/L (ref 98–111)
Creatinine, Ser: 1.52 mg/dL — ABNORMAL HIGH (ref 0.44–1.00)
GFR, Estimated: 33 mL/min — ABNORMAL LOW (ref >60)
Glucose, Bld: 217 mg/dL — ABNORMAL HIGH (ref 70–99)
Potassium: 3.9 mmol/L (ref 3.5–5.1)
Sodium: 139 mmol/L (ref 135–145)
Total Bilirubin: 1 mg/dL (ref 0.3–1.2)
Total Protein: 7 g/dL (ref 6.5–8.1)

## 2021-12-16 LAB — CBC
HCT: 39.7 % (ref 36.0–46.0)
Hemoglobin: 13.4 g/dL (ref 12.0–15.0)
MCH: 30.6 pg (ref 26.0–34.0)
MCHC: 33.8 g/dL (ref 30.0–36.0)
MCV: 90.6 fL (ref 80.0–100.0)
Platelets: 253 10*3/uL (ref 150–400)
RBC: 4.38 MIL/uL (ref 3.87–5.11)
RDW: 14.7 % (ref 11.5–15.5)
WBC: 9.7 10*3/uL (ref 4.0–10.5)
nRBC: 0 % (ref 0.0–0.2)

## 2021-12-16 LAB — URINALYSIS, ROUTINE W REFLEX MICROSCOPIC
Bilirubin Urine: NEGATIVE
Glucose, UA: NEGATIVE mg/dL
Ketones, ur: NEGATIVE mg/dL
Nitrite: NEGATIVE
Protein, ur: 100 mg/dL — AB
Specific Gravity, Urine: 1.015 (ref 1.005–1.030)
pH: 6 (ref 5.0–8.0)

## 2021-12-16 LAB — URINALYSIS, MICROSCOPIC (REFLEX): WBC, UA: >50 WBC/hpf (ref 0–5)

## 2021-12-16 LAB — LIPASE, BLOOD: Lipase: 39 U/L (ref 11–51)

## 2021-12-16 MED ORDER — ONDANSETRON 4 MG PO TBDP: 4.0000 mg | ORAL_TABLET | Freq: Three times a day (TID) | ORAL | 0 refills | Status: DC | PRN

## 2021-12-16 MED ORDER — AMOXICILLIN-POT CLAVULANATE 875-125 MG PO TABS
1.0000 | ORAL_TABLET | Freq: Once | ORAL | Status: AC
  Administered 2021-12-16: 1 via ORAL
  Filled 2021-12-16: qty 1

## 2021-12-16 MED ORDER — AMOXICILLIN-POT CLAVULANATE 875-125 MG PO TABS: 1.0000 | ORAL_TABLET | Freq: Three times a day (TID) | ORAL | 0 refills | Status: AC

## 2021-12-16 MED ORDER — IOHEXOL 350 MG/ML SOLN
100.0000 mL | Freq: Once | INTRAVENOUS | Status: AC | PRN
  Administered 2021-12-16: 60 mL via INTRAVENOUS

## 2021-12-16 NOTE — ED Triage Notes (Addendum)
Patient BIB [facility] from [place] for evaluation of generalized weakness and nausea this morning. EMS also reports facility wanted a wound on left leg evaluated. Patient is alert, history of dementia (is oriented to self and situation), is in no apparent distress at this time.  EMS Vitals BP 160/80 HR 62 CBG 211,

## 2021-12-16 NOTE — ED Provider Notes (Signed)
[facility] EMERGENCY DEPARTMENT Provider Note   CSN: [id number] Arrival date & time: 12/16/21  2585     History  Chief Complaint  Patient presents with   Weakness    Diana [name] is a 88 y.o. female.  Patient with history of hypertension, diabetes, dementia, OSA, frequent UTIs presents today at request of her nursing facility with concern for generalized weakness and nausea. Patient is alert and oriented to self only which is her baseline confirmed by her caregiver present at bedside. She states that the patient is bedbound at baseline but has been somewhat weaker than normal. Also endorses some abdominal pain with nausea. No vomiting. She did have a URI a week ago and was put on doxycycline for same which she is set to complete in a few days. Patients caregiver also endorses concern about a small wound on her right lower leg which was bleeding a few days ago. She denies fevers, chills, chest pain, shortness of breath. States she is having normal bowel movements. She is incontinent, denies any hematuria or dysuria.  Level 5 caveat --- dementia  The history is provided by the patient and a caregiver. No language interpreter was used.  Weakness Associated symptoms: abdominal pain and nausea        Home Medications Prior to Admission medications   Medication Sig Start Date End Date Taking? Authorizing Provider  amLODipine (NORVASC) 5 MG tablet Take 5 mg by mouth daily.    [provider]  brimonidine-timolol (COMBIGAN) 0.2-0.5 % ophthalmic solution Place 1 drop into both eyes every 12 (twelve) hours. 05/26/20   Medina-Vargas, Monina C, NP  cephALEXin (KEFLEX) 500 MG capsule Take 1 capsule (500 mg total) by mouth 3 (three) times daily. 11/16/21   Veryl Speak, MD  ferrous sulfate 325 (65 FE) MG EC tablet Take 325 mg by mouth daily.    [provider]  FLUoxetine (PROZAC) 20 MG capsule Take 1 capsule (20 mg total) by mouth daily. 05/26/20    Medina-Vargas, Monina C, NP  furosemide (LASIX) 20 MG tablet Take 1 tablet (20 mg total) by mouth daily as needed (shortness of breath, take for 5 days). 10/24/21 10/19/22  Buford Dresser, MD  insulin glargine (LANTUS) 100 UNIT/ML Solostar Pen Inject 10 Units into the skin at bedtime.    [provider]  levothyroxine (SYNTHROID) 50 MCG tablet Take 1 tablet (50 mcg total) by mouth daily. 05/26/20   Medina-Vargas, Monina C, NP  losartan (COZAAR) 50 MG tablet Take 1 tablet (50 mg total) by mouth 2 (two) times daily. 05/26/20   Medina-Vargas, Monina C, NP  metFORMIN (GLUCOPHAGE) 500 MG tablet Take 500 mg by mouth 2 (two) times daily with a meal.    [provider]  metoprolol succinate (TOPROL-XL) 25 MG 24 hr tablet Take 0.5 tablets (12.5 mg total) by mouth daily. Take with or immediately following a meal. 12/10/21 12/05/22  Buford Dresser, MD  Multiple Vitamins-Minerals (PRESERVISION AREDS 2 PO) Take 1 capsule by mouth daily.    [provider]  pantoprazole (PROTONIX) 40 MG tablet Take 1 tablet (40 mg total) by mouth 2 (two) times daily before a meal. 05/26/20   Medina-Vargas, Monina C, NP  rosuvastatin (CRESTOR) 10 MG tablet Take 10 mg by mouth at bedtime.    [provider]      Allergies    Codeine sulfate, Sps [sodium polystyrene sulfonate], and Sulfa antibiotics    Review of Systems   Review of Systems  Unable  to perform ROS: Dementia  Gastrointestinal:  Positive for abdominal pain and nausea.  Neurological:  Positive for weakness.  All other systems reviewed and are negative.   Physical Exam Updated Vital Signs BP (!) 177/90 (BP Location: Left Arm)   Pulse 81   Temp 98.2 F (36.8 C)   Resp 18   LMP  (LMP Unknown)   SpO2 96%  Physical Exam Vitals and nursing note reviewed.  Constitutional:      General: She is not in acute distress.    Appearance: Normal appearance. She is normal weight. She is not ill-appearing, toxic-appearing or  diaphoretic.  HENT:     Head: Normocephalic and atraumatic.  Cardiovascular:     Rate and Rhythm: Normal rate and regular rhythm.     Heart sounds: Normal heart sounds.  Pulmonary:     Effort: Pulmonary effort is normal. No respiratory distress.     Breath sounds: Normal breath sounds.  Abdominal:     General: Abdomen is flat.     Palpations: Abdomen is soft.     Tenderness: There is abdominal tenderness. There is no right CVA tenderness, left CVA tenderness, guarding or rebound.     Comments: Mild generalized abdominal tenderness to palpation  Musculoskeletal:        General: Normal range of motion.     Cervical back: Normal range of motion.  Skin:    General: Skin is warm and dry.     Comments: 1 cm diameter pressure wound noted to the lateral RLE. No purulence, erythema, warmth, fluctuance, or induration. DP and PT pulses intact and 2+. ROM intact.  Neurological:     General: No focal deficit present.     Mental Status: She is alert.  Psychiatric:        Mood and Affect: Mood normal.        Behavior: Behavior normal.     ED Results / Procedures / Treatments   Labs (all labs ordered are listed, but only abnormal results are displayed) Labs Reviewed  COMPREHENSIVE METABOLIC PANEL - Abnormal; Notable for the following components:      Result Value   Glucose, Bld 217 (*)    BUN 29 (*)    Creatinine, Ser 1.52 (*)    Calcium 8.6 (*)    Albumin 3.4 (*)    GFR, Estimated 33 (*)    All other components within normal limits  URINALYSIS, ROUTINE W REFLEX MICROSCOPIC - Abnormal; Notable for the following components:   Hgb urine dipstick SMALL (*)    Protein, ur 100 (*)    Leukocytes,Ua MODERATE (*)    All other components within normal limits  URINALYSIS, MICROSCOPIC (REFLEX) - Abnormal; Notable for the following components:   Bacteria, UA MANY (*)    All other components within normal limits  URINE CULTURE  LIPASE, BLOOD  CBC    EKG None  Radiology CT ABDOMEN PELVIS  W CONTRAST  Result Date: 12/16/2021 CLINICAL DATA:  Abdominal pain. EXAM: CT ABDOMEN AND PELVIS WITH CONTRAST TECHNIQUE: Multidetector CT imaging of the abdomen and pelvis was performed using the standard protocol following bolus administration of intravenous contrast. RADIATION DOSE REDUCTION: This exam was performed according to the departmental dose-optimization program which includes automated exposure control, adjustment of the mA and/or kV according to patient size and/or use of iterative reconstruction technique. CONTRAST:  75m OMNIPAQUE IOHEXOL 350 MG/ML SOLN COMPARISON:  Abdomen and pelvis CT 12/31/2018 FINDINGS: Lower chest: Heart is enlarged. Coronary artery calcification is evident. Large  hiatal hernia. Hepatobiliary: No suspicious focal abnormality within the liver parenchyma. Gallbladder is surgically absent. No intrahepatic or extrahepatic biliary dilation. Pancreas: No focal mass lesion. No dilatation of the main duct. No intraparenchymal cyst. No peripancreatic edema. Spleen: No splenomegaly. No focal mass lesion. Adrenals/Urinary Tract: No adrenal nodule or mass. Renal cortical atrophy noted bilaterally. 2.0 cm lesion posterior interpolar right kidney (31/4) has increased in size from 1.2 cm previously. This lesion has attenuation too high to be a simple cyst. Multiple cystic lesions noted left kidney including 3.2 cm upper pole lesion on 20/4 with apparent posterior mural nodularity. This lesion was 3.5 cm previously suggesting benign etiology. 1.8 cm anterior interpolar left renal lesion with apparent central enhancing nodule. Nodular component is more prominent than before. 1.2 cm subcapsular high attenuation lesion identified posterior lower interpolar left kidney on 29/4, increased from 0.5 cm previously. No hydronephrosis. No evidence for hydroureter. The urinary bladder appears normal for the degree of distention. Stomach/Bowel: Large hiatal hernia with 50-75% of the stomach contained in  the chest. Duodenum is normally positioned as is the ligament of Treitz. No small bowel wall thickening. No small bowel dilatation. The terminal ileum is normal. The appendix is not well visualized, but there is no edema or inflammation in the region of the cecum. Diverticular disease is seen diffusely in the colon with right-sided predominance. There is some subtle pericolonic edema/inflammation in the region of the proximal sigmoid segment (image 50/4) suggesting diverticulitis. No perforation or abscess Vascular/Lymphatic: There is moderate atherosclerotic calcification of the abdominal aorta without aneurysm. There is no gastrohepatic or hepatoduodenal ligament lymphadenopathy. No retroperitoneal or mesenteric lymphadenopathy. No pelvic sidewall lymphadenopathy. Reproductive: The uterus is surgically absent. There is no adnexal mass. Other: No intraperitoneal free fluid. Musculoskeletal: Tiny paraumbilical ventral hernia contains only fat. No worrisome lytic or sclerotic osseous abnormality. Old left-sided rib fractures noted. Status post vertebral augmentation at L1. IMPRESSION: 1. Diffuse colonic diverticulosis with right-sided predominance. There is some subtle pericolonic edema/inflammation in the region of the proximal sigmoid segment suggesting diverticulitis. No perforation or abscess. 2. Large hiatal hernia with 50-75% of the stomach contained in the chest. 3. Multiple bilateral renal cystic lesions. Some of these lesions are indeterminate with one particular 1.8 cm interpolar left renal lesion showing apparent internal nodularity. MRI abdomen with and without contrast might provide additional characterization. Repeat CT in 6 months could be used to ensure stability. 4. Tiny paraumbilical ventral hernia contains only fat. 5. Aortic Atherosclerosis (ICD10-I70.0). Electronically Signed   By: Misty Stanley M.D.   On: 12/16/2021 11:09    Procedures Procedures    Medications Ordered in ED Medications   iohexol (OMNIPAQUE) 350 MG/ML injection 100 mL (60 mLs Intravenous Contrast Given 12/16/21 1047)  amoxicillin-clavulanate (AUGMENTIN) 875-125 MG per tablet 1 tablet (1 tablet Oral Given 12/16/21 1520)    ED Course/ Medical Decision Making/ A&P                           Medical Decision Making Amount and/or Complexity of Data Reviewed Labs: ordered. Radiology: ordered.  Risk Prescription drug management.   This patient presents to the ED for concern of abdominal pain, weakness, nausea, this involves an extensive number of treatment options, and is a complaint that carries with it a high risk of complications and morbidity.  Co morbidities that complicate the patient evaluation  Hx hypertension, hyperlipidemia, dementia   Additional history obtained:  Additional history obtained from epic chart  review and patients caregiver present at bedside   Lab Tests:  I Ordered, and personally interpreted labs.  The pertinent results include:  creatinine 1.52 unchanged from previous. UA with moderate leukocytes, >50 WBCs, many bacteria    Imaging Studies ordered:  I ordered imaging studies including CT abdomen pelvis  I independently visualized and interpreted imaging which showed  1. Diffuse colonic diverticulosis with right-sided predominance. There is some subtle pericolonic edema/inflammation in the region of the proximal sigmoid segment suggesting diverticulitis. No perforation or abscess. I agree with the radiologist interpretation   Problem List / ED Course / Critical interventions / Medication management  Diverticulitis and UTI I ordered medication including Augmentin  for infection  Reevaluation of the patient after these medicines showed that the patient stayed the same I have reviewed the patients home medicines and have made adjustments as needed   Social Determinants of Health:  Hx dementia from memory care   Test / Admission - Considered:  Patient presents  today with weakness and nausea with abdominal pain.  She is afebrile, nontoxic-appearing, and in no acute distress with reassuring vital signs.  She is also at her baseline mental status.  Laboratory evaluation benign.  CT imaging does show diverticulitis and UA shows signs for infection.  She is not septic.  She does have a noninfectious pressure injury to her lateral right lower extremity which appears to be healing.  Given the patient's age and comorbid factors, I did consider admission for antibiotics, however patient is able to tolerate p.o. Augmentin which should cover both her UTI and her diverticulitis.  Upon discussion with my attending Dr. Nechama Guard who also personally saw and evaluated the patient, decision was made to discharge the patient with Augmentin and close return precautions.  Also given Zofran for nausea.  I have also given her a referral to GI with a number to call to schedule an appointment for continued evaluation and management of her symptoms.  Patient and caregiver are understanding and amenable with plan, educated on red flag symptoms that would prompt immediate return.  Patient discharged in stable condition.   This is a shared visit with supervising physician Dr. Nechama Guard who has independently evaluated patient & provided guidance in evaluation/management/disposition, in agreement with care    Final Clinical Impression(s) / ED Diagnoses Final diagnoses:  Diverticulitis  Acute cystitis with hematuria    Rx / DC Orders ED Discharge Orders          Ordered    amoxicillin-clavulanate (AUGMENTIN) 875-125 MG tablet  3 times daily        12/16/21 1713    ondansetron (ZOFRAN-ODT) 4 MG disintegrating tablet  Every 8 hours PRN        12/16/21 1713          An After Visit Summary was printed and given to the patient.     Nestor Lewandowsky 12/16/21 1827    Elgie Congo, MD 12/16/21 [phone]

## 2021-12-16 NOTE — Discharge Instructions (Signed)
As we discussed, your work-up in the ER today did show signs of diverticulitis.  I suspect that that is the cause of your symptoms.  You also do appear to have a mild urinary tract infection.  I have given you a prescription for an antibiotic that should cover both the diverticulitis and UTI.  Please take it as prescribed in its entirety for management of your symptoms.  Additionally, I have given you a prescription for Zofran which is an antinausea medication which she can take as prescribed as needed for management of nausea and vomiting.  I have given you a referral to GI with a number to call to schedule an appointment for continued evaluation and management of your symptoms.  I recommend that you call at your earliest convenience.  Return if development of any new or worsening symptoms.

## 2021-12-17 ENCOUNTER — Encounter: Payer: Self-pay | Admitting: Nurse Practitioner

## 2021-12-18 LAB — URINE CULTURE: Culture: >=100000 — AB

## 2021-12-19 ENCOUNTER — Telehealth (HOSPITAL_BASED_OUTPATIENT_CLINIC_OR_DEPARTMENT_OTHER): Payer: Self-pay | Admitting: *Deleted

## 2021-12-19 NOTE — Telephone Encounter (Signed)
Post ED Visit - Positive Culture Follow-up  Culture report reviewed by antimicrobial stewardship pharmacist: [facility] [place] Team '[]'$  Elenor Quinones, Pharm.D. '[]'$  Heide Guile, Pharm.D., BCPS AQ-ID '[]'$  Parks Neptune, Pharm.D., BCPS '[]'$  Alycia Rossetti, Pharm.D., BCPS '[]'$  [place], Pharm.D., BCPS, AAHIVP '[]'$  Legrand Como, Pharm.D., BCPS, AAHIVP '[]'$  Salome Arnt, PharmD, BCPS '[]'$  Johnnette Gourd, PharmD, BCPS '[]'$  Hughes Better, PharmD, BCPS '[]'$  Leeroy Cha, PharmD '[]'$  Laqueta Linden, PharmD, BCPS '[]'$  Albertina Parr, PharmD  [place] Team '[]'$  Leodis Sias, PharmD '[]'$  Lindell Spar, PharmD '[]'$  Royetta Asal, PharmD '[]'$  Graylin Shiver, Rph '[]'$  Rema Fendt) Glennon Mac, PharmD '[]'$  Arlyn Dunning, PharmD '[]'$  Netta Cedars, PharmD '[]'$  Dia Sitter, PharmD '[]'$  Leone Haven, PharmD '[]'$  Gretta Arab, PharmD '[]'$  Theodis Shove, PharmD '[]'$  Peggyann Juba, PharmD '[]'$  Reuel Boom, PharmD   Positive urine culture No UTI symptoms and no further patient follow-up is required at this time. Will Truett Mainland, MD  Harlon Flor Talley 12/19/2021, 10:45 AM

## 2021-12-19 NOTE — Progress Notes (Addendum)
ED Antimicrobial Stewardship Positive Culture Follow Up   Diana [name] is an 88 y.o. female who presented to [facility] from SNF on 12/16/2021 with a chief complaint of generalized weakness, nausea, and abdominal pain. Recently diagnosed with URI and taking doxycycline.   Chief Complaint  Patient presents with   Weakness    Recent Results (from the past 720 hour(s))  Urine Culture     Status: Abnormal   Collection Time: 12/16/21  3:00 PM   Specimen: Urine, Clean Catch  Result Value Ref Range Status   Specimen Description URINE, CLEAN CATCH  Final   Special Requests   Final    NONE Performed at [facility], [address], [place], [place] [address]    Culture >=100,000 COLONIES/mL CITROBACTER FREUNDII (A)  Final   Report Status 12/18/2021 FINAL  Final   Organism ID, Bacteria CITROBACTER FREUNDII (A)  Final      Susceptibility   Citrobacter freundii - MIC*    CEFAZOLIN >=64 RESISTANT Resistant     CEFEPIME 0.5 SENSITIVE Sensitive     CEFTRIAXONE >=64 RESISTANT Resistant     CIPROFLOXACIN <=0.25 SENSITIVE Sensitive     GENTAMICIN <=1 SENSITIVE Sensitive     IMIPENEM 0.5 SENSITIVE Sensitive     NITROFURANTOIN <=16 SENSITIVE Sensitive     TRIMETH/SULFA <=20 SENSITIVE Sensitive     PIP/TAZO 64 INTERMEDIATE Intermediate     * >=100,000 COLONIES/mL CITROBACTER FREUNDII   Patient has dementia at baseline, caregiver denies any urinary symptoms or fevers and reports patient is at baseline mentation. CT scan in ED showed evidence of possible diverticulitis. Patient was discharged on Augmentin.  Patient has a history of abnormal urine analyses and cultures, making it difficult to determine if this is urinary tract infection versus colonization. Recommend calling facility to check if patient is experiencing symptoms.  If she is having symptoms, recommend starting Macrobid 100 mg twice daily for 5 days. If she is not having symptoms, will not recommend any additional  antibiotics.   ED Provider: Joanette Gula, PA-C  Louanne Belton, PharmD, [facility] PGY1 Pharmacy Resident 12/19/2021 10:09 AM

## 2021-12-25 IMAGING — CT CT CERVICAL SPINE W/O CM
3 of 4 series · 12 of 33 positions shown, 14 images · non-contrast
Comparison: Prior head CT examinations 07/31/2020 and earlier. CT
of the cervical spine 02/07/2020. Thyroid ultrasound 07/10/2020.

CLINICAL DATA: Possible head trauma. Additional provided: Patient
found down. Dizziness. On blood thinners. Rule out fracture.

EXAM:
CT HEAD WITHOUT CONTRAST
CT CERVICAL SPINE WITHOUT CONTRAST
TECHNIQUE: Multidetector CT imaging of the head and cervical spine was
performed following the standard protocol without intravenous
contrast. Multiplanar CT image reconstructions of the cervical spine
were also generated.

[Series 5: c_spine 2.0 st · axial · 0.35mm/px · z∈[-278,-144]mm · 4 of 101 slices shown, 5 images]
[im 17/101  soft-tissue]
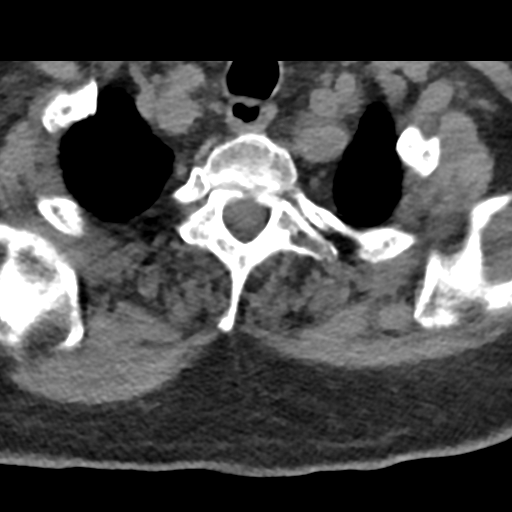
[im 17/101  bone]
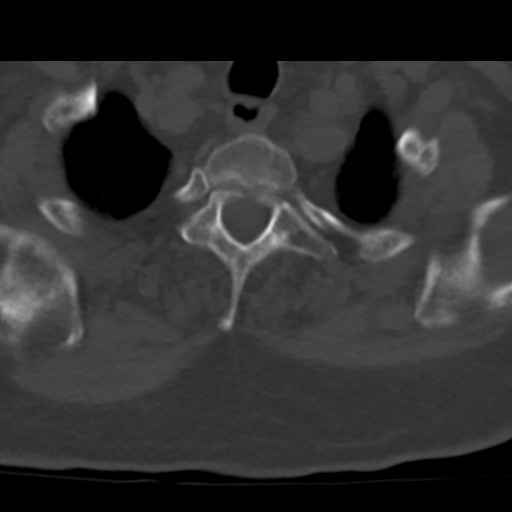
[im 34/101  bone]
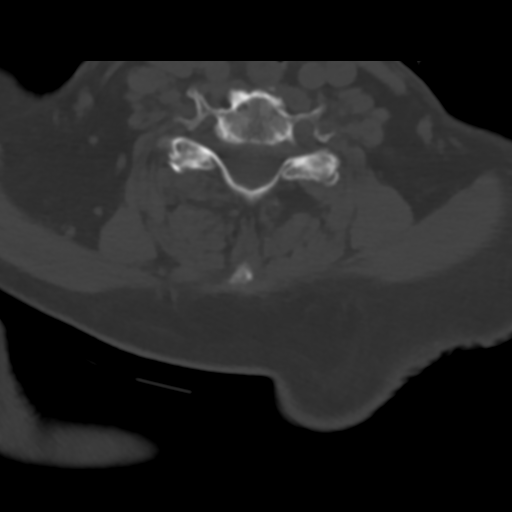
[im 67/101  bone]
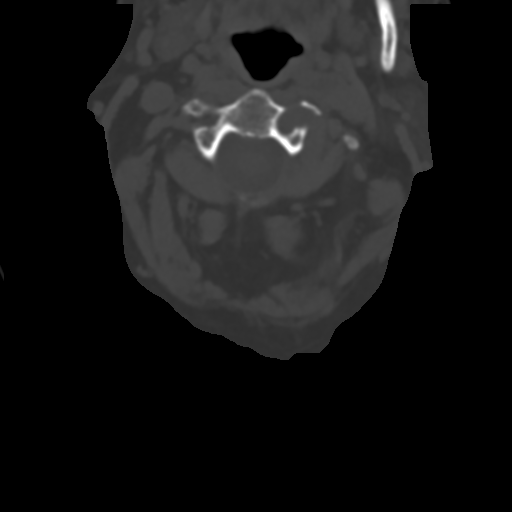
[im 84/101  bone]
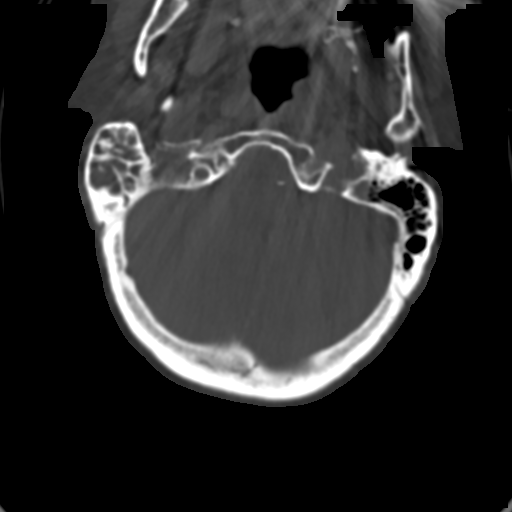

[Series 6: coronal bone · coronal · 0.31mm/px · 3 of 67 slices shown]
[im 14/67  bone]
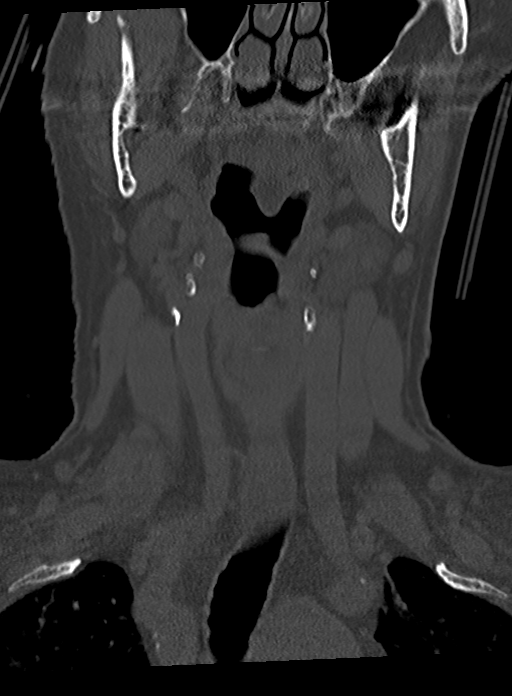
[im 27/67  bone]
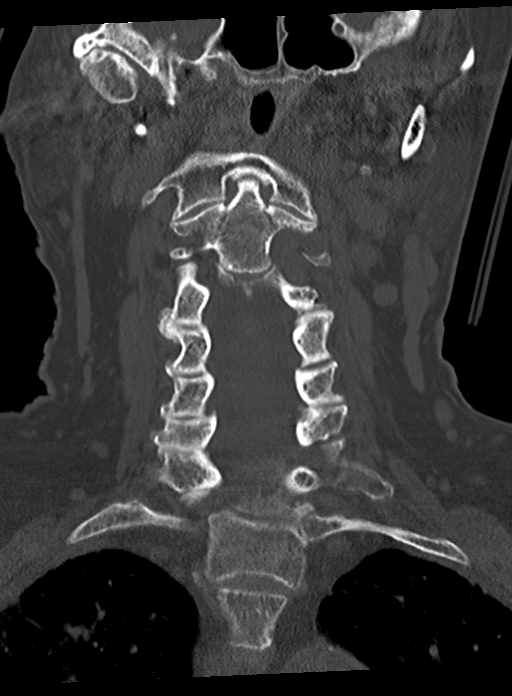
[im 40/67  bone]
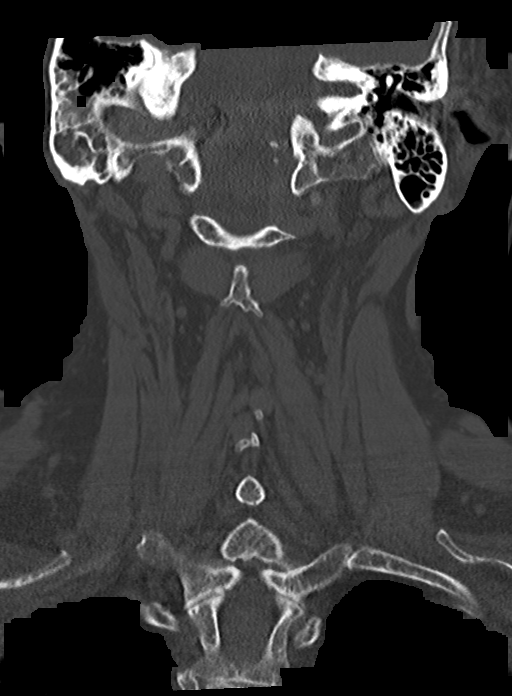

[Series 7: sagittal bone · sagittal · 0.29mm/px · 5 of 61 slices shown, 6 images]
[im 21/61  bone]
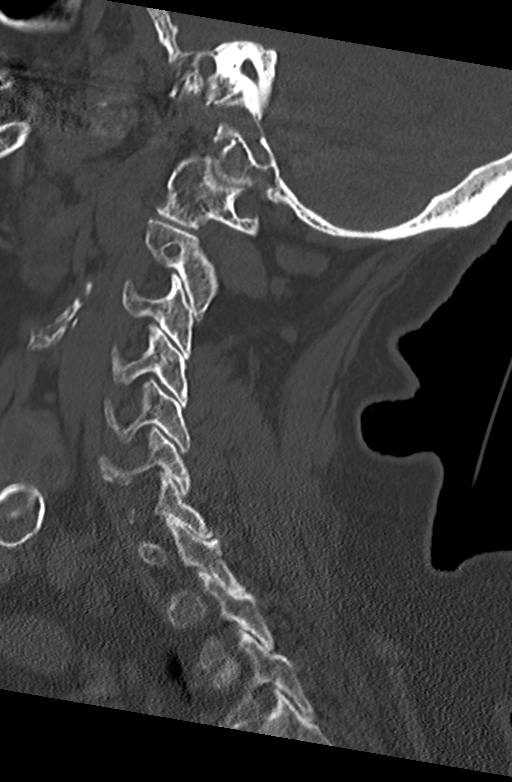
[im 26/61  bone]
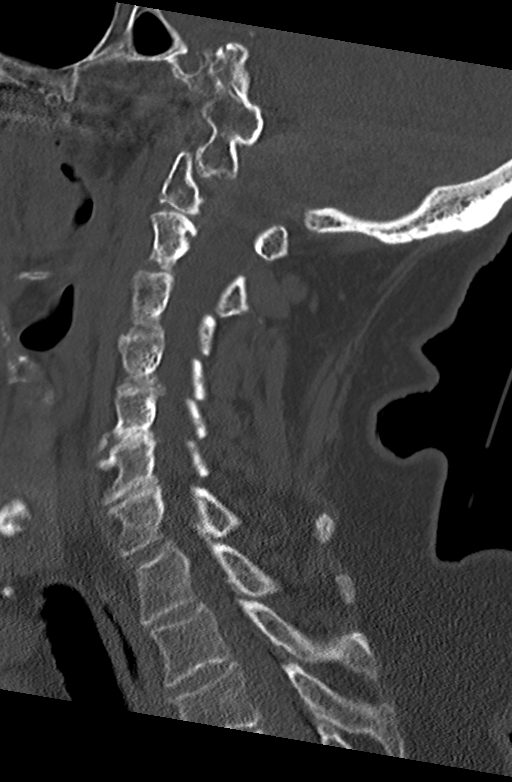
[im 31/61  soft-tissue]
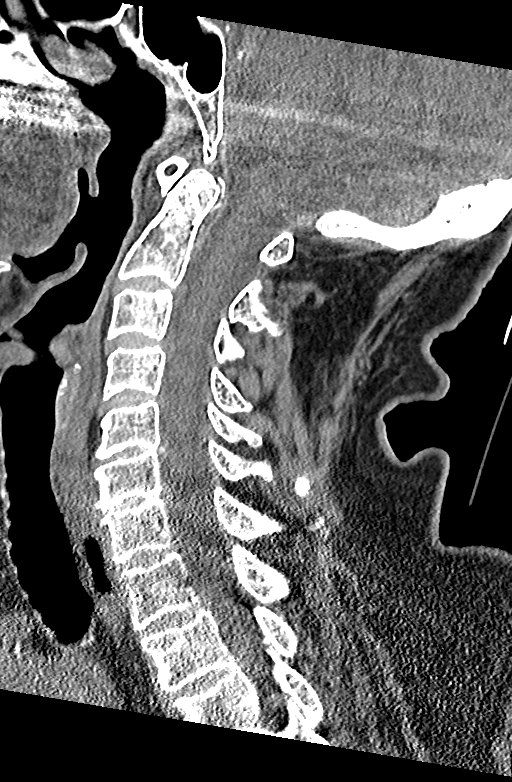
[im 31/61  bone]
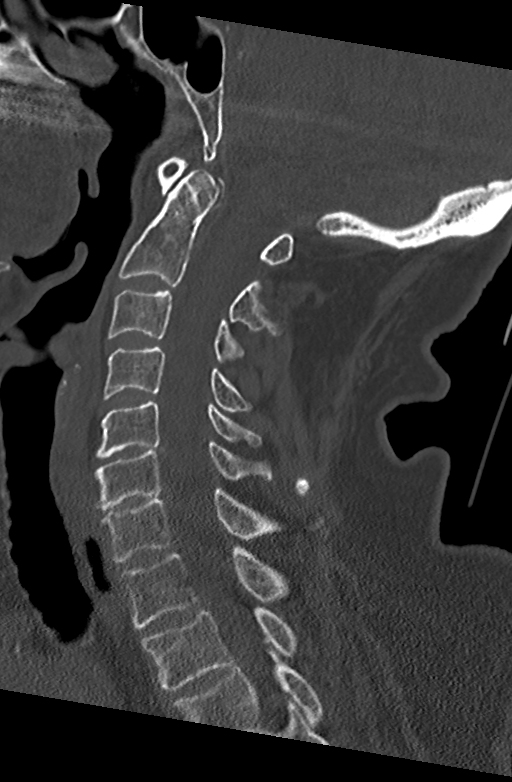
[im 36/61  bone]
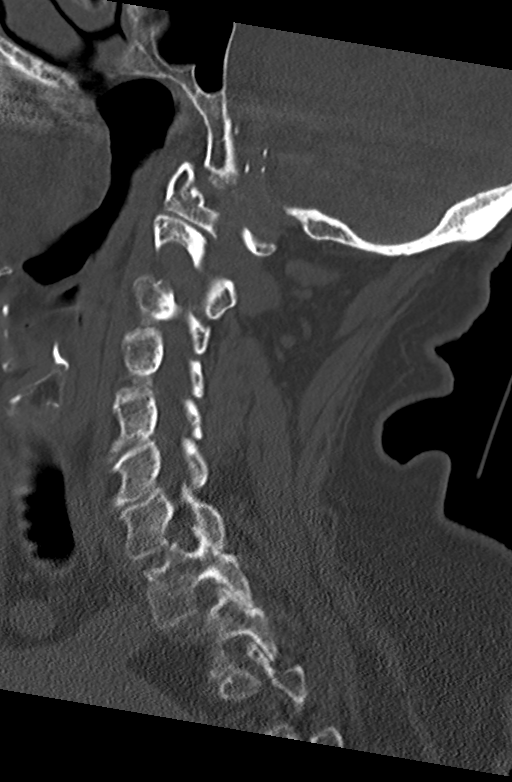
[im 41/61  bone]
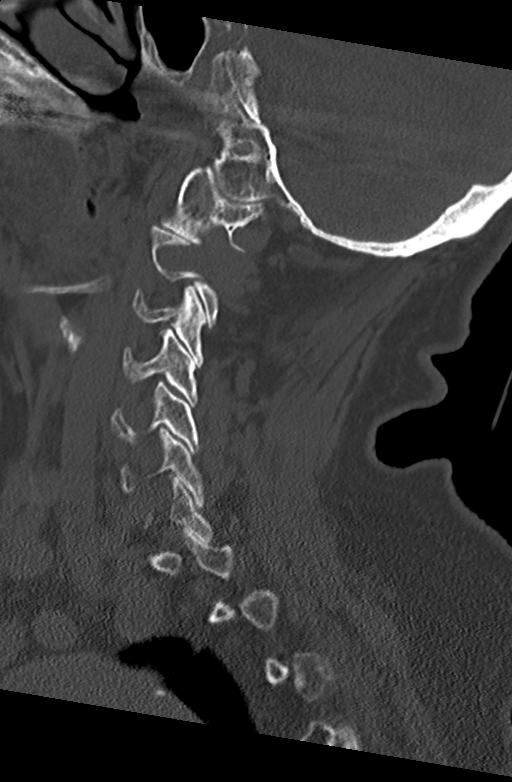

[12 of 33 positions shown; findings below may reference images not displayed]

FINDINGS: CT HEAD FINDINGS

Brain:

Mildly motion degraded exam.

Moderate generalized cerebral atrophy.

A small volume acute hemorrhage within the atrium of the left
lateral ventricle (series 4, image 15).

Patchy and confluent hypoattenuation within the cerebral white
matter, nonspecific but compatible with chronic small vessel
ischemic disease.

Redemonstrated small chronic lacunar infarct within the right
cerebellar hemisphere (series 4, image 8).

No demarcated cortical infarct.

No extra-axial fluid collection.

No evidence of intracranial mass.

No midline shift.

Vascular: No hyperdense vessel.  Atherosclerotic calcifications.

Skull: Normal. Negative for fracture or focal suspicious osseous
lesion.

Sinuses/Orbits: Visualized orbits show no acute finding. Mild
bilateral ethmoid sinus mucosal thickening. Trace left sphenoid
sinus mucosal thickening.

Other: Right mastoid effusion. Unchanged 1 cm right parotid nodule,
suspicious for primary parotid neoplasm.

CT CERVICAL SPINE FINDINGS

Alignment: No significant spondylolisthesis. Partially imaged
thoracic dextrocurvature.

Skull base and vertebrae: The basion-dental and atlanto-dental
intervals are maintained.No evidence of acute fracture to the
cervical spine.

Soft tissues and spinal canal: No prevertebral fluid or swelling. No
visible canal hematoma.

Disc levels: Cervical spondylosis with multilevel disc space
narrowing, disc bulges, central disc protrusions, uncovertebral
hypertrophy and facet arthrosis. No appreciable high-grade spinal
canal stenosis. Multilevel bony neural foraminal narrowing.

Upper chest: No consolidation within the imaged lung apices. No
visible pneumothorax.

Other: Redemonstrated 2 cm calcified right thyroid lobe nodule, by
report previously biopsied on 07/10/2020. Correlate with any
available pathology.

CT head impression #2 called by telephone at the time of
interpretation on 08/25/2020 at [DATE] to provider MONU OVER ,
who verbally acknowledged these results.
IMPRESSION: CT head:

1. Mildly motion degraded exam.
2. Small-volume acute hemorrhage within the atrium of the left
lateral ventricle.
3. Moderate/severe cerebral white matter chronic small vessel
ischemic disease.
4. Redemonstrated small chronic lacunar infarct within the right
cerebellar hemisphere.
5. Moderate cerebral atrophy with comparatively mild cerebellar
atrophy.
6. Mild paranasal sinus disease at the imaged levels.
7. Right mastoid effusion.
8. Unchanged 1 cm right parotid nodule suspicious for primary
parotid neoplasm.

CT cervical spine:

1. No evidence of acute fracture in cervical spine.
2. Cervical spondylosis, as described.

## 2021-12-25 IMAGING — DX DG ANKLE COMPLETE 3+V*R*
1 series · 4 of 4 positions shown · non-contrast
Comparison: None.

CLINICAL DATA: Fall with ankle swelling.

EXAM:
RIGHT ANKLE - COMPLETE 3+ VIEW

[Series 1: ankle · 0.14mm/px · 4 of 4 slices shown]
[im 1/4]
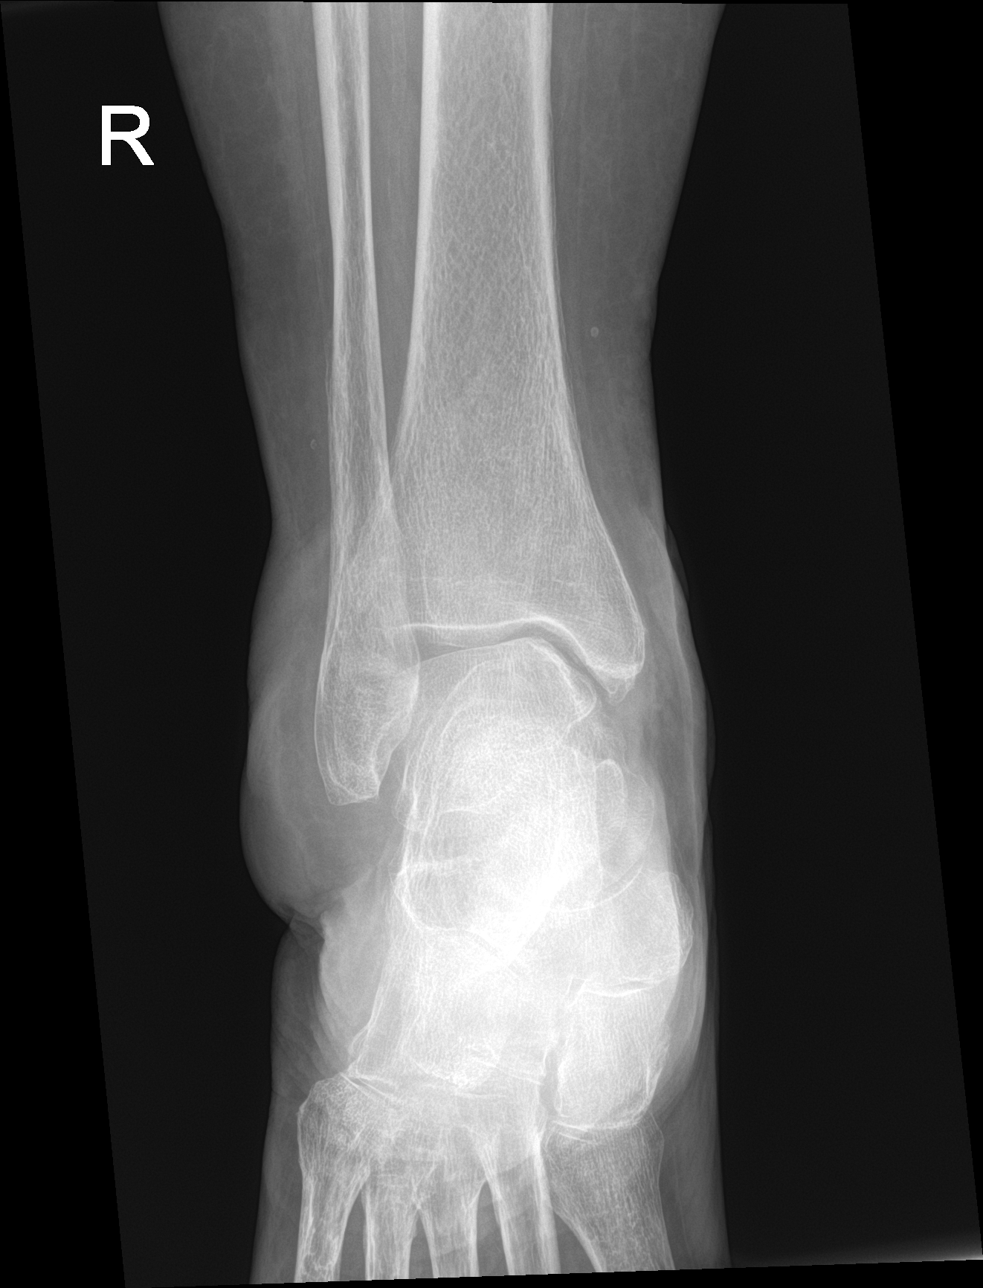
[im 2/4]
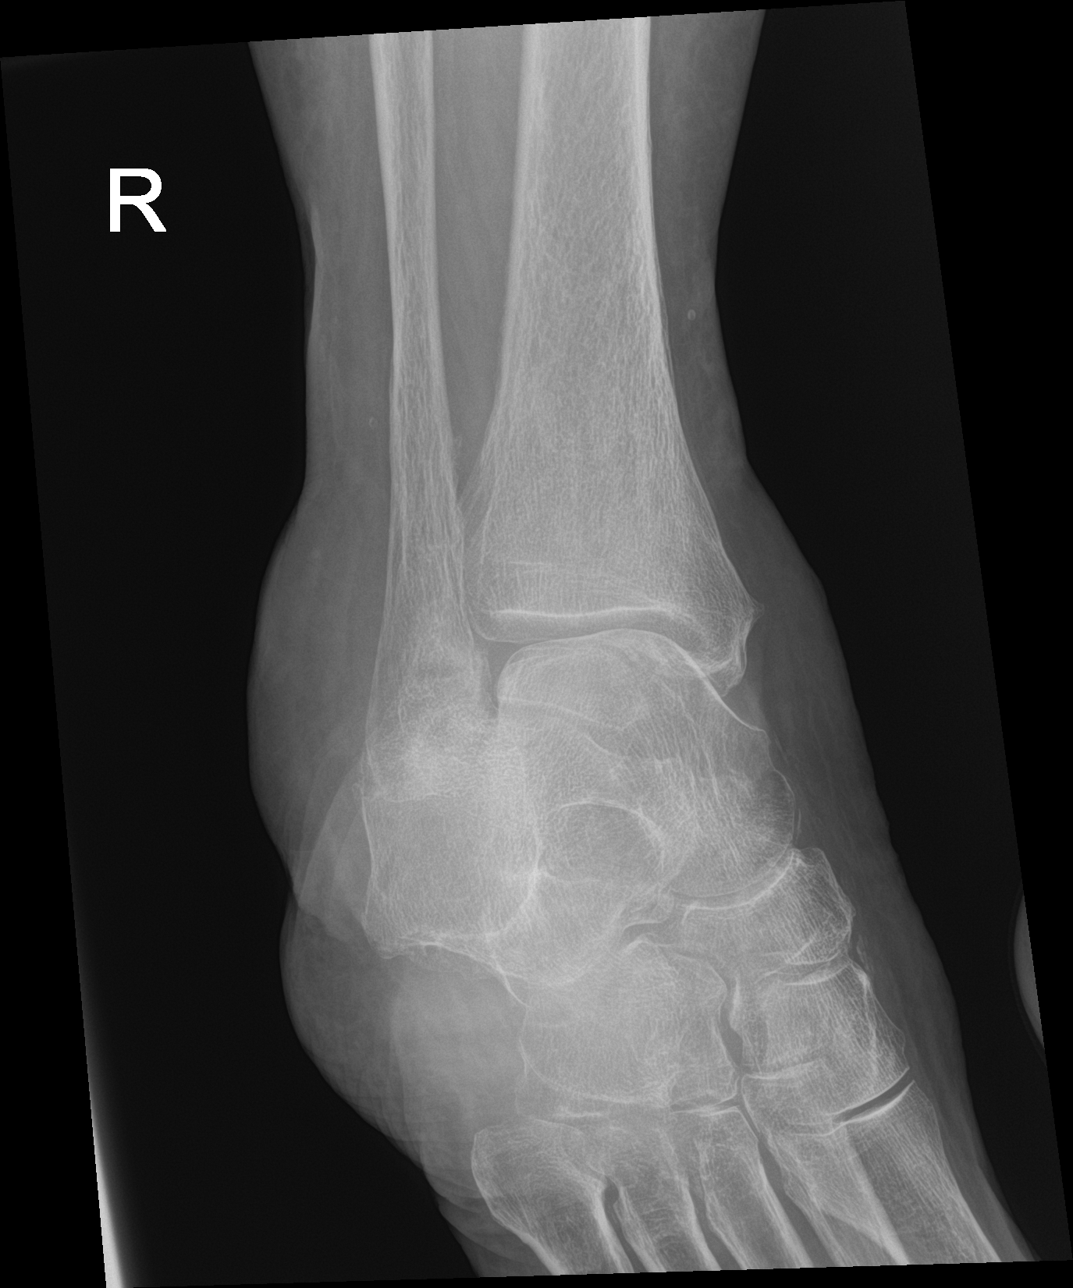
[im 3/4]
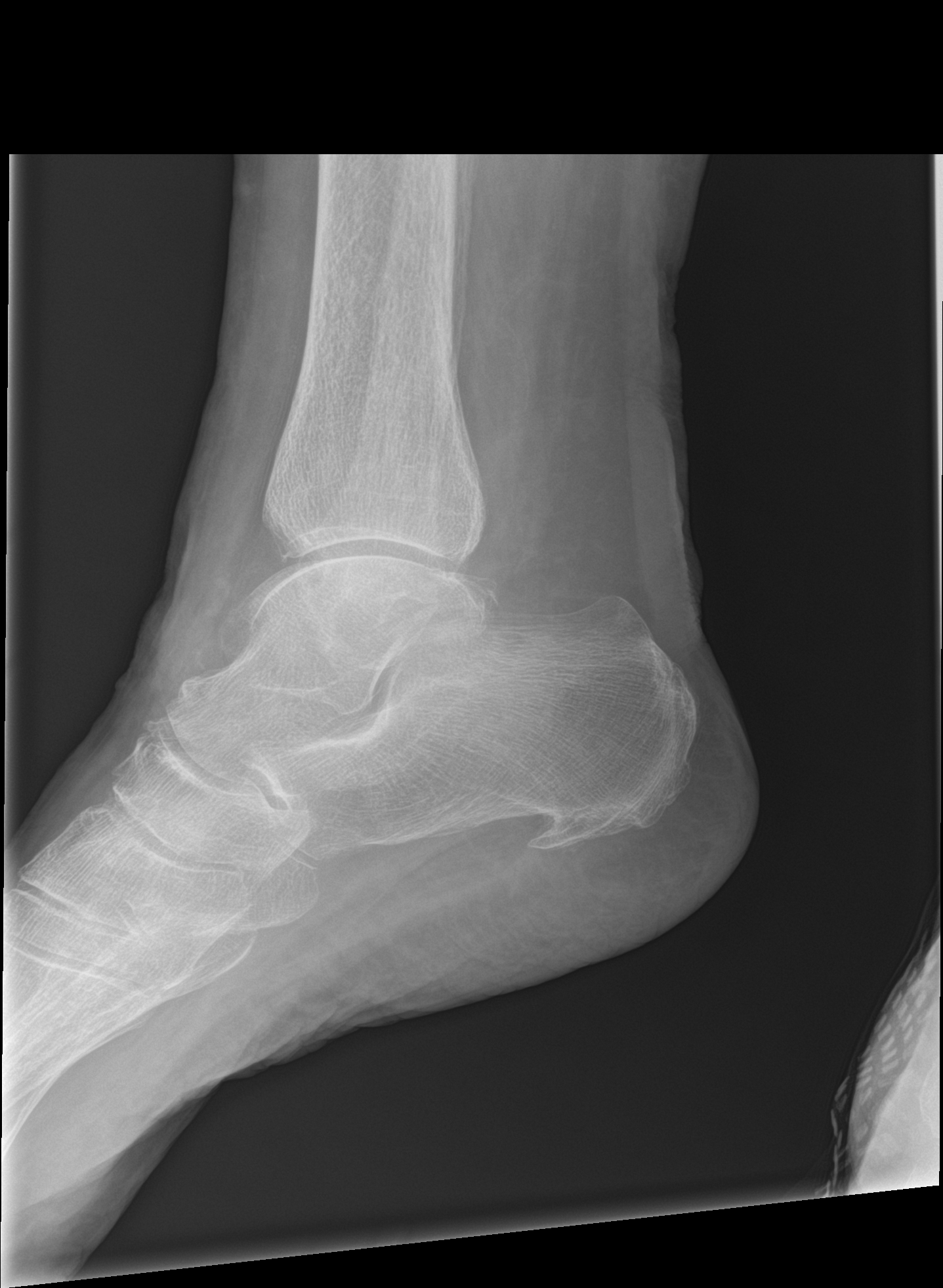
[im 4/4]
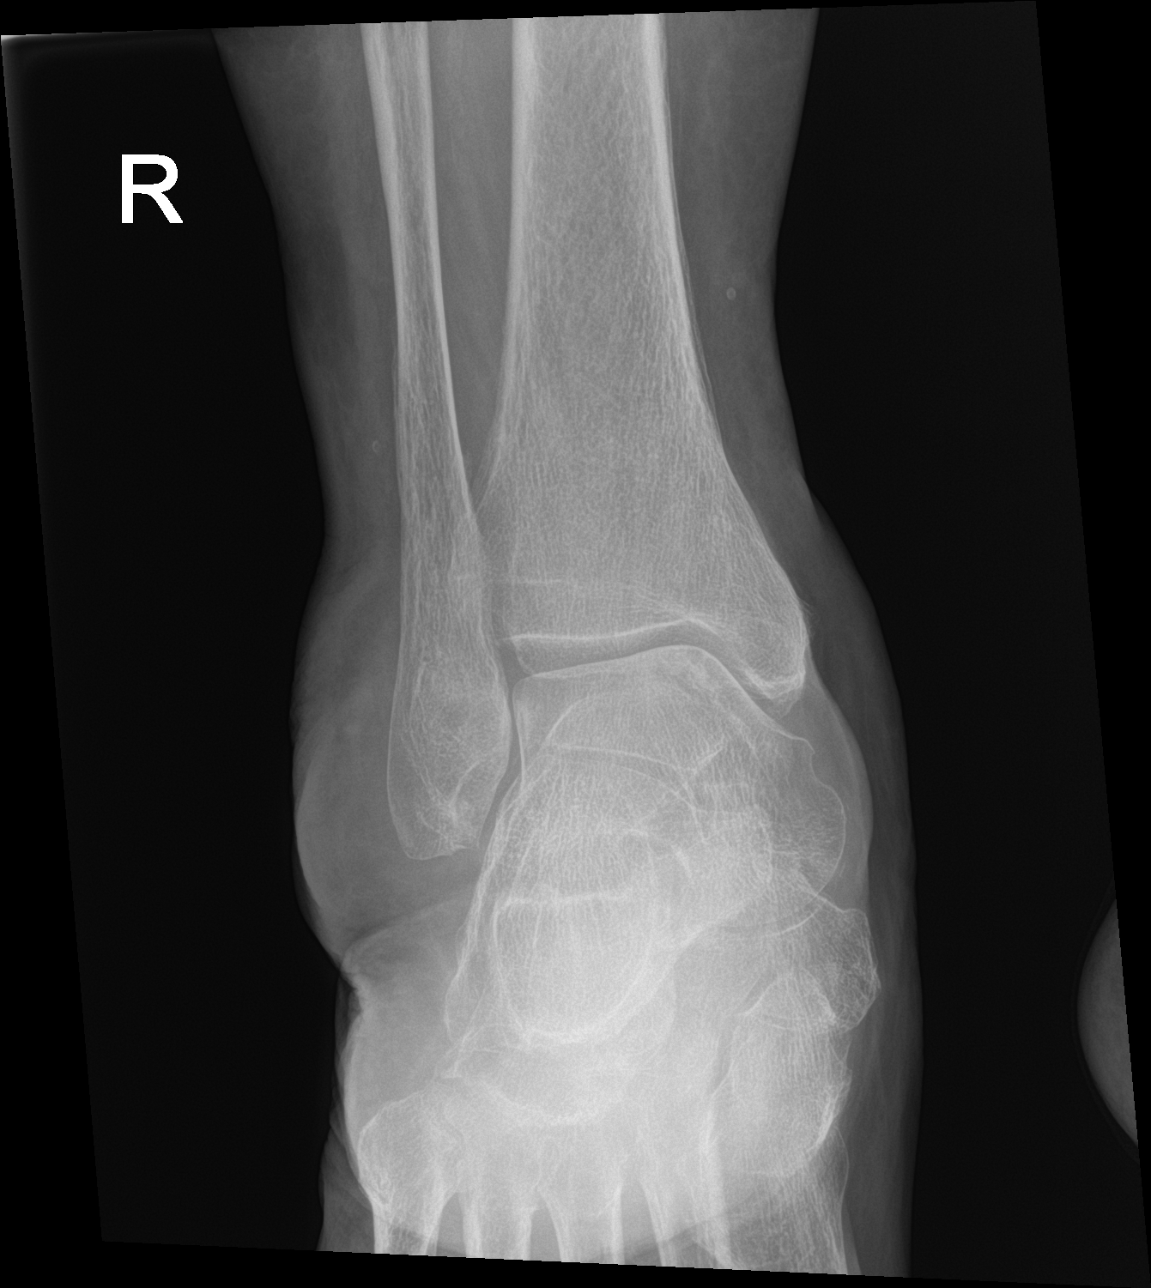

[4 of 4 positions shown; findings below may reference images not displayed]

FINDINGS: Soft tissue swelling in the right ankle, particularly along the
lateral aspect. The ankle is located but there is widening along the
lateral ankle mortise with some tilting of the talus. Oblique image
raises concern for a nondisplaced fracture involving the fibula at
the level of the ankle joint. Prominent plantar calcaneal spur.
IMPRESSION: Probable nondisplaced fracture involving the distal fibula only seen
on the oblique image. There is extensive soft tissue swelling along
the lateral aspect of the ankle.

Widening along the lateral aspect of the ankle mortise as described.

## 2022-01-07 ENCOUNTER — Other Ambulatory Visit: Payer: Self-pay | Admitting: Family Medicine

## 2022-01-07 DIAGNOSIS — M810 Age-related osteoporosis without current pathological fracture: Secondary | ICD-10-CM

## 2022-01-08 DIAGNOSIS — D509 Iron deficiency anemia, unspecified: Secondary | ICD-10-CM | POA: Insufficient documentation

## 2022-01-08 DIAGNOSIS — I5032 Chronic diastolic (congestive) heart failure: Secondary | ICD-10-CM | POA: Insufficient documentation

## 2022-01-08 DIAGNOSIS — R270 Ataxia, unspecified: Secondary | ICD-10-CM | POA: Insufficient documentation

## 2022-01-08 DIAGNOSIS — E785 Hyperlipidemia, unspecified: Secondary | ICD-10-CM | POA: Insufficient documentation

## 2022-01-08 DIAGNOSIS — K219 Gastro-esophageal reflux disease without esophagitis: Secondary | ICD-10-CM | POA: Insufficient documentation

## 2022-01-08 DIAGNOSIS — Z853 Personal history of malignant neoplasm of breast: Secondary | ICD-10-CM | POA: Insufficient documentation

## 2022-01-21 ENCOUNTER — Ambulatory Visit: Payer: Medicare PPO | Admitting: Nurse Practitioner

## 2022-01-21 ENCOUNTER — Encounter: Payer: Self-pay | Admitting: Nurse Practitioner

## 2022-01-21 ENCOUNTER — Other Ambulatory Visit: Payer: Medicare PPO

## 2022-01-21 VITALS — BP 120/76 | HR 64 | Ht 68.0 in | Wt 176.0 lb

## 2022-01-21 DIAGNOSIS — K449 Diaphragmatic hernia without obstruction or gangrene: Secondary | ICD-10-CM

## 2022-01-21 DIAGNOSIS — K5792 Diverticulitis of intestine, part unspecified, without perforation or abscess without bleeding: Secondary | ICD-10-CM | POA: Diagnosis not present

## 2022-01-21 DIAGNOSIS — R197 Diarrhea, unspecified: Secondary | ICD-10-CM

## 2022-01-21 NOTE — Patient Instructions (Signed)
_______________________________________________________  If you are age 86 or older, your body mass index should be between 23-30. Your Body mass index is 26.76 kg/m. If this is out of the aforementioned range listed, please consider follow up with your Primary Care Provider.  If you are age 70 or younger, your body mass index should be between 19-25. Your Body mass index is 26.76 kg/m. If this is out of the aformentioned range listed, please consider follow up with your Primary Care Provider.   Your provider has requested that you go to the basement level for lab work before leaving today. Press "B" on the elevator. The lab is located at the first door on the left as you exit the elevator.  Continue Pantoprazole twice daily.  Please call if you have recurrent lower abdominal pain .   The [facility] providers would like to encourage you to use [facility] to communicate with providers for non-urgent requests or questions.  Due to long hold times on the telephone, sending your provider a message by [facility] may be a faster and more efficient way to get a response.  Please allow 48 business hours for a response.  Please remember that this is for non-urgent requests.   It was a pleasure to see you today!  Thank you for trusting me with your gastrointestinal care!

## 2022-01-21 NOTE — Progress Notes (Signed)
Chief Complaint:  ED follow up   Assessment &  Plan   # 88 yo female with recent uncomplicated sigmoid diverticulitis on CT scan in ED. Completed course of Augmentin.  Has dementia, obtaining history is difficult but no complaints of abdominal pain per caregiver. Non-tender on exam.  Caregiver will let us know if patient seems to have recurrent abdominal pain.    # Large hiatal hernia with 50-70% of stomach in her chest on CT scan. Seems to be asymptomatic at this point Continue BID Pantoprazole.   # Chronic loose stool. Given duration of at least 6 months an infectious etiology seems unlikely but needs to be ruled out,  especially given frequent antibiotics use for UTIs.  Stool for lactoferrin, C-diff, giardia ag Will need to call [facility] with results. Her number is in the chart.   # History of colon polyps. Multiple adenomatous colon polyps, including a tubulovillous adenoma were removed at time of last colonoscopy in 2015.  Caregiver doesn't think patient would be able to undergo a surveillance colonoscopy She wouldn't have anyone to help her prep at the assisted living facility.  I called sister-in-law "[name]" to get her thoughts.  [name] agreed that there is no one available to help her prep .  Furthermore she doesn't want to subject patient to anesthesia. She understands that recurrent colon polyps/colon cancer is always possible.   # GERD / large hiatal hernia with large portion of stomach in chest on CT scan   # Recurrent UTIs  # Multiple bilateral renal cystic lesions on CT scan in ED early October.  Some of the lesions are indeterminate with one in particular showing nodularity.  Evaluation per PCP  # Dementia. Not oriented to time or place.   HPI   88 yo female previously known to Dr.  Olevia Perches but since seen in hospital in Oct 2020 by Dr. [name]. She has a past medical history of large hiatal hernia, colon polyps, diverticulosis, OSA, HTN, hyperlipidemia,  hypothyroidism, obesity, CKD, bil breast cancer, bil mastectomy 2001 , cholecystectomy 1974, bowel resection 1974, no details on this surgery.  See PMH /PSH for additional history    Diana [name]  is here with her caregiver [name] Her sister is POA but not here today. Patient was in ED on 10/9 for abdominal pain. CT scan showed uncomplicated diverticulitis. UA suggested UTI. Sent home with Augmentin which she completed. Caregiver tells me patient has been having frequent loose stool for a few months. Caregiver has taken her to PCP who apparently didn't think it was related to metformin. Sounds like no stool studies have been done. She does have some normal, formed stools but not very often.    Previous GI Evaluation   Colonoscopy May 2015 - fair prep - There was moderate diverticulosis noted throughout the entire examined colon -Eight semi-pedunculated and sessile polyps measuring 3-15 mm in size were found throughout the entire examined colon; polypectomy was performed with cold forceps, with a cold snare and using snare cautery   Labs:     Latest Ref Rng & Units 12/16/2021    8:55 AM 11/16/2021   12:46 AM 10/20/2021    8:12 AM  CBC  WBC 4.0 - 10.5 K/uL 9.7  6.2  8.6   Hemoglobin 12.0 - 15.0 g/dL 13.4  11.6  12.1   Hematocrit 36.0 - 46.0 % 39.7  36.2  37.1   Platelets 150 - 400 K/uL 253  217  238  Latest Ref Rng & Units 12/16/2021    8:55 AM 10/20/2021    8:12 AM 08/25/2020    9:22 AM  Hepatic Function  Total Protein 6.5 - 8.1 g/dL 7.0  6.6  7.0   Albumin 3.5 - 5.0 g/dL 3.4  3.3  3.2   AST 15 - 41 U/L _0 ALT 0 - 44 U/L _1 Alk Phosphatase 38 - 126 U/L 60  57  116   Total Bilirubin 0.3 - 1.2 mg/dL 1.0  1.0  1.5      Past Medical History:  Diagnosis Date   Anemia    Anemia of decreased vitamin B12 absorption 05/26/2005   Arthritis    Atrial fibrillation ([place])    Breast cancer ([place]) 2001   Cataract    GERD (gastroesophageal reflux disease)    Hiatal  hernia    History of tobacco abuse    Hx of colonic polyps    Hyperlipidemia    Hypertension    Hypothyroidism    Memory loss    Obesity    OSA on CPAP 09/2007   AHI 10.6/hr overall, 43.64/hr during REM, lost weight, does not use cpap   Osteopenia    Renal insufficiency    Type 2 diabetes mellitus ([place])     Past Surgical History:  Procedure Laterality Date   BACK SURGERY  05/30/2009   BIOPSY  01/01/2019   Procedure: BIOPSY;  Surgeon: Milus Banister, MD;  Location: [place];  Service: Endoscopy;;   BOWEL RESECTION  1974   CATARACT EXTRACTION Bilateral    bilateral   CHOLECYSTECTOMY  1974   COLONOSCOPY     ESOPHAGOGASTRODUODENOSCOPY N/A 01/01/2019   Procedure: ESOPHAGOGASTRODUODENOSCOPY (EGD);  Surgeon: Milus Banister, MD;  Location: [facility] ENDOSCOPY;  Service: Endoscopy;  Laterality: N/A;   EXTERNAL FIXATION REMOVAL Left 04/27/2020   Procedure: REMOVAL EXTERNAL FIXATION ARM;  Surgeon: Shona Needles, MD;  Location: [place];  Service: Orthopedics;  Laterality: Left;   MASTECTOMY Bilateral 2001   bilateral sentinel lymph nodes bio   NM MYOCAR PERF WALL MOTION  06/2007   dipyridamole; perfusion defect in inferior myocardium consistent with diaphragmatic attenuation, remaining myocardium with no ischemia/infarct, EF 73%; normal, low risk scan    ORIF ELBOW FRACTURE Left 03/21/2020   Procedure: OPEN REDUCTION INTERNAL FIXATION (ORIF) ELBOW/OLECRANON FRACTURE;  Surgeon: Shona Needles, MD;  Location: [place] [place];  Service: Orthopedics;  Laterality: Left;   TOTAL ABDOMINAL HYSTERECTOMY  1975   TRANSTHORACIC ECHOCARDIOGRAM  02/2010   JT=>70%, stage 1 diastolic dysfunction; borderline RV enlargement; LA mild-mod dilated; mild mitral annular calcif & mild MR; mild TR with normal RSVP, AV moderately sclerotics    Current Medications, Allergies, Family History and Social History were reviewed in [employer] record.     Current Outpatient Medications  Medication Sig  Dispense Refill   amLODipine (NORVASC) 5 MG tablet Take 5 mg by mouth daily.     brimonidine-timolol (COMBIGAN) 0.2-0.5 % ophthalmic solution Place 1 drop into both eyes every 12 (twelve) hours. 5 mL 0   cephALEXin (KEFLEX) 500 MG capsule Take 1 capsule (500 mg total) by mouth 3 (three) times daily. 21 capsule 0   ferrous sulfate 325 (65 FE) MG EC tablet Take 325 mg by mouth daily.     FLUoxetine (PROZAC) 20 MG capsule Take 1 capsule (20 mg total) by mouth daily. 30 capsule 0   furosemide (LASIX) 20 MG  tablet Take 1 tablet (20 mg total) by mouth daily as needed (shortness of breath, take for 5 days). 90 tablet 3   insulin glargine (LANTUS) 100 UNIT/ML Solostar Pen Inject 10 Units into the skin at bedtime.     levothyroxine (SYNTHROID) 50 MCG tablet Take 1 tablet (50 mcg total) by mouth daily. 30 tablet 0   losartan (COZAAR) 50 MG tablet Take 1 tablet (50 mg total) by mouth 2 (two) times daily. 60 tablet 0   metFORMIN (GLUCOPHAGE) 500 MG tablet Take 500 mg by mouth 2 (two) times daily with a meal.     metoprolol succinate (TOPROL-XL) 25 MG 24 hr tablet Take 0.5 tablets (12.5 mg total) by mouth daily. Take with or immediately following a meal. 45 tablet 3   Multiple Vitamins-Minerals (PRESERVISION AREDS 2 PO) Take 1 capsule by mouth daily.     ondansetron (ZOFRAN-ODT) 4 MG disintegrating tablet Take 1 tablet (4 mg total) by mouth every 8 (eight) hours as needed for nausea or vomiting. 20 tablet 0   pantoprazole (PROTONIX) 40 MG tablet Take 1 tablet (40 mg total) by mouth 2 (two) times daily before a meal. 60 tablet 0   rosuvastatin (CRESTOR) 10 MG tablet Take 10 mg by mouth at bedtime.     No current facility-administered medications for this visit.    Review of Systems: Not really obtainable with dementia. She is not oriented to place or time today  Physical Exam  Wt Readings from Last 3 Encounters:  10/24/21 183 lb 8 oz (83.2 kg)  10/05/21 194 lb 0.1 oz (88 kg)  09/12/21 194 lb (88 kg)     LMP  (LMP Unknown)  Constitutional:  Generally well appearing female in wheelchair. Psychiatric: Pleasant. Quiet EENT: Pupils normal.  Conjunctivae are normal. No scleral icterus. Neck supple.  Cardiovascular: Normal rate, regular rhythm. Murmur present Pulmonary/chest: Effort normal and breath sounds normal. No wheezing, rales or rhonchi. Abdominal: Limited exam in wheelchair.  Abdomen soft, nondistended, nontender. Bowel sounds active throughout. There are no masses palpable. No hepatomegaly. Neurological: Alert and oriented to person place and time. Skin: Skin is warm and dry. No rashes noted.  Tye Savoy, NP  01/21/2022, 8:40 AM

## 2022-01-23 ENCOUNTER — Encounter (HOSPITAL_BASED_OUTPATIENT_CLINIC_OR_DEPARTMENT_OTHER): Payer: Self-pay | Admitting: Cardiology

## 2022-01-23 ENCOUNTER — Ambulatory Visit (HOSPITAL_BASED_OUTPATIENT_CLINIC_OR_DEPARTMENT_OTHER): Payer: Medicare PPO | Admitting: Cardiology

## 2022-01-23 VITALS — BP 138/74 | HR 70 | Ht 68.0 in | Wt 178.0 lb

## 2022-01-23 DIAGNOSIS — I4821 Permanent atrial fibrillation: Secondary | ICD-10-CM

## 2022-01-23 DIAGNOSIS — D6869 Other thrombophilia: Secondary | ICD-10-CM | POA: Diagnosis not present

## 2022-01-23 DIAGNOSIS — R600 Localized edema: Secondary | ICD-10-CM | POA: Diagnosis not present

## 2022-01-23 DIAGNOSIS — I5032 Chronic diastolic (congestive) heart failure: Secondary | ICD-10-CM | POA: Diagnosis not present

## 2022-01-23 NOTE — Progress Notes (Signed)
Cardiology Office Note:    Date:  01/26/2022   ID:  Diana [name], DOB 05/29/1933, MRN [id number]  PCP:  Percell Belt, DO  Cardiologist:  Buford Dresser, MD  Referring MD: Percell Belt, DO   CC: follow up  History of Present Illness:    Diana [name] is a 88 y.o. female with a hx of hypertension, type II diabetes, hyperlipidemia, atrial fibrillation who is seen for follow-up. She was initially seen 03/16/20 as a new consult at the request of Lazoff, Shawn P, DO for the evaluation and management of atrial fibrillation. She was seen in the office by Dr. Claiborne Billings in 11/2016.  Previously, her caretaker reported she had been falling with no one available. She was often found on the floor. She was sent to the ER 04/2021 because she hit the back of her head. She did have a small fracture on her L ankle that was able to heal quickly. She was taken off of Eliquis due to the frequent falls. Getting up and walking around caused her to feel dizzy and winded.   At her last visit she had 2-3 falls in the prior two months. She had random episodes of pallor, diaphoresis, and emesis about 3-4 times over 2 years. Her swelling improved with furosemide. She was also struggling with recurrent UTIs. Her Lasix was changed to prn.   She was most recently seen in the ED 12/16/2021 for abdominal pain. CT scan showed uncomplicated diverticulitis. Her UA was suggestive of UTI. She was discharged with Augmentin and prn Zofran.  Today: She is accompanied by her caretaker who acts as her historian. At times her confusion seems to be worse than usual.  Since her last visit they deny any falls.   Sometimes her LE swelling is "iffy." Her caretaker notes that she is not usually given her furosemide as it is listed for shortness of breath.    Her caretaker is not aware of any complaints regarding palpitations.  At times she wakes up in the night with a cough. After a short time she will return to  sleep. Her caretaker is not sure if PND is occurring.  She denies any chest pain, or shortness of breath. No lightheadedness, headaches, syncope, orthopnea.   Past Medical History:  Diagnosis Date   Anemia    Anemia of decreased vitamin B12 absorption 05/26/2005   Arthritis    Atrial fibrillation ([employer])    Breast cancer ([place]) 2001   Cataract    GERD (gastroesophageal reflux disease)    Hiatal hernia    History of tobacco abuse    Hx of colonic polyps    Hyperlipidemia    Hypertension    Hypothyroidism    Memory loss    Obesity    OSA on CPAP 09/2007   AHI 10.6/hr overall, 43.64/hr during REM, lost weight, does not use cpap   Osteopenia    Renal insufficiency    Type 2 diabetes mellitus [facility])     Past Surgical History:  Procedure Laterality Date   BACK SURGERY  05/30/2009   BIOPSY  01/01/2019   Procedure: BIOPSY;  Surgeon: Milus Banister, MD;  Location: [place];  Service: Endoscopy;;   BOWEL RESECTION  1974   CATARACT EXTRACTION Bilateral    bilateral   CHOLECYSTECTOMY  1974   COLONOSCOPY     ESOPHAGOGASTRODUODENOSCOPY N/A 01/01/2019   Procedure: ESOPHAGOGASTRODUODENOSCOPY (EGD);  Surgeon: Milus Banister, MD;  Location: [facility] ENDOSCOPY;  Service: Endoscopy;  Laterality: N/A;  EXTERNAL FIXATION REMOVAL Left 04/27/2020   Procedure: REMOVAL EXTERNAL FIXATION ARM;  Surgeon: Shona Needles, MD;  Location: [place];  Service: Orthopedics;  Laterality: Left;   MASTECTOMY Bilateral 2001   bilateral sentinel lymph nodes bio   NM MYOCAR PERF WALL MOTION  06/2007   dipyridamole; perfusion defect in inferior myocardium consistent with diaphragmatic attenuation, remaining myocardium with no ischemia/infarct, EF 73%; normal, low risk scan    ORIF ELBOW FRACTURE Left 03/21/2020   Procedure: OPEN REDUCTION INTERNAL FIXATION (ORIF) ELBOW/OLECRANON FRACTURE;  Surgeon: Shona Needles, MD;  Location: [place];  Service: Orthopedics;  Laterality: Left;   TOTAL ABDOMINAL HYSTERECTOMY  1975    TRANSTHORACIC ECHOCARDIOGRAM  02/2010   DI=>26%, stage 1 diastolic dysfunction; borderline RV enlargement; LA mild-mod dilated; mild mitral annular calcif & mild MR; mild TR with normal RSVP, AV moderately sclerotics    Current Medications: Current Outpatient Medications on File Prior to Visit  Medication Sig   amLODipine (NORVASC) 5 MG tablet Take 5 mg by mouth daily.   brimonidine-timolol (COMBIGAN) 0.2-0.5 % ophthalmic solution Place 1 drop into both eyes every 12 (twelve) hours.   Cholecalciferol (VITAMIN D) 50 MCG (2000 UT) tablet Take 2,000 Units by mouth daily.   Cranberry-Vitamin C-Probiotic (AZO CRANBERRY PO) Take 500 mg by mouth 2 (two) times daily.   ferrous sulfate 325 (65 FE) MG EC tablet Take 325 mg by mouth daily.   FLUoxetine (PROZAC) 40 MG capsule Take 1 capsule by mouth daily.   furosemide (LASIX) 20 MG tablet Take 1 tablet (20 mg total) by mouth daily as needed (shortness of breath, take for 5 days).   insulin glargine (LANTUS) 100 UNIT/ML injection Inject 8 Units into the skin at bedtime.   levothyroxine (SYNTHROID) 75 MCG tablet Take 1 tablet by mouth daily.   losartan (COZAAR) 50 MG tablet Take 1 tablet (50 mg total) by mouth 2 (two) times daily.   metFORMIN (GLUCOPHAGE) 500 MG tablet Take 500 mg by mouth 2 (two) times daily with a meal.   metoprolol succinate (TOPROL-XL) 25 MG 24 hr tablet Take 0.5 tablets (12.5 mg total) by mouth daily. Take with or immediately following a meal.   Multiple Vitamins-Minerals (PRESERVISION AREDS 2 PO) Take 1 capsule by mouth daily.   ondansetron (ZOFRAN-ODT) 4 MG disintegrating tablet Take 1 tablet (4 mg total) by mouth every 8 (eight) hours as needed for nausea or vomiting.   pantoprazole (PROTONIX) 40 MG tablet Take 1 tablet (40 mg total) by mouth 2 (two) times daily before a meal.   rosuvastatin (CRESTOR) 10 MG tablet Take 10 mg by mouth at bedtime.   No current facility-administered medications on file prior to visit.      Allergies:   Codeine sulfate, Sps [sodium polystyrene sulfonate], and Sulfa antibiotics   Social History   Tobacco Use   Smoking status: Former    Years: 13.00    Types: Cigarettes    Quit date: 05/02/2002    Years since quitting: 19.7   Smokeless tobacco: Never  Vaping Use   Vaping Use: Never used  Substance Use Topics   Alcohol use: No    Alcohol/week: 0.0 standard drinks of alcohol   Drug use: Never    Family History: family history includes Heart attack in her father and mother. There is no history of Colon cancer.  ROS:   Please see the history of present illness. (+) Bilateral LE edema All other systems are reviewed and negative.   EKGs/Labs/Other Studies Reviewed:  The following studies were reviewed today:  Echo 08/26/20  1. Left ventricular ejection fraction, by estimation, is 70 to 75%. The  left ventricle has hyperdynamic function. The left ventricle has no  regional wall motion abnormalities. There is mild left ventricular  hypertrophy. Left ventricular diastolic  function could not be evaluated.   2. Right ventricular systolic function is mildly reduced. The right  ventricular size is normal. There is mildly elevated pulmonary artery  systolic pressure.   3. Left atrial size was severely dilated.   4. Right atrial size was mildly dilated.   5. The mitral valve is abnormal. Trivial mitral valve regurgitation.   6. The aortic valve is tricuspid. Mild sclerosis. Aortic valve  regurgitation is not visualized. Aortic valve mean gradient measures 8.0  mmHg, DI is 0.50   7. The inferior vena cava is normal in size with greater than 50%  respiratory variability, suggesting right atrial pressure of 3 mmHg.   Comparison(s): Changes from prior study are noted. 12/28/2018: LVEF  60-65%, no aortic stenosis.  CT Chest  06/30/2020: FINDINGS: Cardiovascular: Heart size is mildly enlarged. No pericardial effusion. Thoracic aorta is nonaneurysmal.  Atherosclerotic calcifications of the aorta and coronary arteries. Main pulmonary trunk measures 3.4 cm in diameter.   Mediastinum/Nodes: No axillary, mediastinal, or hilar lymphadenopathy. Surgical clips present in the bilateral axillary regions. Trachea within normal limits. Moderate-sized hiatal hernia. Esophagus unremarkable.   Lungs/Pleura: Trace right pleural effusion with mild bibasilar dependent atelectasis. Mild bronchial wall thickening. Lungs are otherwise clear. No pneumothorax.   Upper Abdomen: No acute abnormality.   Musculoskeletal: No acute osseous findings. Thoracic vertebral body heights are maintained. Mild scoliotic curvature. Multilevel degenerative disc disease. Incidental T11 vertebral body hemangioma. Advanced bilateral glenohumeral arthropathy. Bilateral mastectomies. No chest wall lesion.   IMPRESSION: 1. Trace right pleural effusion with mild bibasilar dependent atelectasis. 2. Mild cardiomegaly. 3. Moderate-sized hiatal hernia. 4. Mildly dilated main pulmonary trunk, which can be seen with pulmonary arterial hypertension. 5. Aortic and coronary artery atherosclerosis (ICD10-I70.0).    Carotid US 01/01/19 Summary:  Right Carotid: Velocities in the right ICA are consistent with a 1-39%  stenosis.   Left Carotid: Velocities in the left ICA are consistent with a 1-39%  stenosis.   Vertebrals: Bilateral vertebral arteries demonstrate antegrade flow.  Echo 12/31/18 1. Left ventricular ejection fraction, by visual estimation, is 60 to  65%. The left ventricle has normal function. Normal left ventricular size.  There is no left ventricular hypertrophy.   2. Left ventricular diastolic function could not be evaluated pattern of  LV diastolic filling.   3. Global right ventricle has normal systolic function.The right  ventricular size is not well visualized. No increase in right ventricular  wall thickness.   4. Left atrial size was normal.   5.  Right atrial size was not well visualized.   6. Moderate mitral annular calcification.   7. The mitral valve was not well visualized. Trace mitral valve  regurgitation. No evidence of mitral stenosis.   8. The tricuspid valve is normal in structure. Tricuspid valve  regurgitation was not visualized by color flow Doppler.   9. The aortic valve is normal in structure. Aortic valve regurgitation  was not visualized by color flow Doppler. Structurally normal aortic  valve, with no evidence of sclerosis or stenosis.  10. The pulmonic valve was not well visualized. Pulmonic valve  regurgitation is not visualized by color flow Doppler.  11. TR signal is inadequate for assessing pulmonary artery systolic  pressure.   EKG:  EKG is personally reviewed.   01/23/2022:  EKG was not ordered. 10/24/2021:  atrial fibrillation at 55 bpm, LBBB 05/14/21: atrial fibrillation at 65 bpm 03/16/20: atrial fibrillation at 75 bpm  Recent Labs: 10/20/2021: B Natriuretic Peptide 585.9 12/16/2021: ALT 17; BUN 29; Creatinine, Ser 1.52; Hemoglobin 13.4; Platelets 253; Potassium 3.9; Sodium 139   Recent Lipid Panel    Component Value Date/Time   CHOL 85 04/02/2020 0000   TRIG 58 04/02/2020 0000   HDL 35 04/02/2020 0000   CHOLHDL 2.7 07/05/2009 1005   VLDL 22 07/05/2009 1005   LDLCALC 38 04/02/2020 0000    Physical Exam:    VS:  BP 138/74   Pulse 70   Ht '5\' 8"'$  (1.727 m)   Wt 178 lb (80.7 kg)   LMP  (LMP Unknown)   BMI 27.06 kg/m     Wt Readings from Last 3 Encounters:  01/23/22 178 lb (80.7 kg)  01/21/22 176 lb (79.8 kg)  10/24/21 183 lb 8 oz (83.2 kg)    GEN: Well nourished, well developed in no acute distress HEENT: Normal, moist mucous membranes NECK: No JVD CARDIAC: irregularly irregular rhythm, normal S1 and S2, no rubs or gallops. 2/6 systolic murmur. VASCULAR: Radial pulses 2+ bilaterally. RESPIRATORY:  Clear to auscultation without rales, wheezing or rhonchi  ABDOMEN: Soft, non-tender,  non-distended MUSCULOSKELETAL: In wheelchair SKIN: Warm and dry, + LE edema R>L consistent with venous insufficiency.  NEUROLOGIC:  Alert and oriented x 3. No focal neuro deficits noted. PSYCHIATRIC:  Normal affect    ASSESSMENT:    1. Permanent atrial fibrillation ([place])   2. Chronic diastolic heart failure ([place])   3. Secondary hypercoagulable state ([place])   4. Bilateral leg edema     PLAN:    Atrial fibrillation, permanent -she is completely asymptomatic -aim for rate control with metoprolol, bradycardia is drug induced and remains 50s-60s -normal EF on echo -was on apixaban. See prior discussion. Very difficult situation. High risk of stroke given score, below, but high risk of life threatening injury/bleed with fall. Previously discussed with family, will not pursue anticoagulation per preference -CHA2DS2/VAS Stroke Risk Points= 6   LE edema: most consistent with venous insufficiency -has PRN lasix -aim for elevation, does not tolerate compression well  Hypertension: -on losartan, metoprolol, amlodipine -well controlled in the office generally  Dyslipidemia Type II diabetes -on rosuvastatin -was not on aspirin due to anticoagulation, with high fall risk will not add aspirin at this time -on metformin and insulin  Plan for follow up: 6 months or sooner as needed.  Buford Dresser, MD, PhD, [facility]    Medication Adjustments/Labs and Tests Ordered: Current medicines are reviewed at length with the patient today.  Concerns regarding medicines are outlined above.   No orders of the defined types were placed in this encounter.  No orders of the defined types were placed in this encounter.  Patient Instructions  Medication Instructions:  Your physician recommends that you continue on your current medications as directed. Please refer to the Current Medication list given to you today.   *If you need a refill on your cardiac medications  before your next appointment, please call your pharmacy*  Lab Work: NONE  Testing/Procedures: NONE  Follow-Up: At [facility], you and your health needs are our priority.  As part of our continuing mission to provide you with exceptional heart care, we have created designated Provider Care Teams.  These Care  Teams include your primary Cardiologist (physician) and Advanced Practice Providers (APPs -  Physician Assistants and Nurse Practitioners) who all work together to provide you with the care you need, when you need it.  We recommend signing up for the patient portal called "MyChart".  Sign up information is provided on this After Visit Summary.  MyChart is used to connect with patients for Virtual Visits (Telemedicine).  Patients are able to view lab/test results, encounter notes, upcoming appointments, etc.  Non-urgent messages can be sent to your provider as well.   To learn more about what you can do with MyChart, go to [email or web address].    Your next appointment:   6 month(s)  The format for your next appointment:   In Person  Provider:   Buford Dresser, MD           [facility] Stumpf,acting as a scribe for Buford Dresser, MD.,have documented all relevant documentation on the behalf of Buford Dresser, MD,as directed by  Buford Dresser, MD while in the presence of Buford Dresser, MD.  I, Buford Dresser, MD, have reviewed all documentation for this visit. The documentation on 01/26/22 for the exam, diagnosis, procedures, and orders are all accurate and complete.   Signed, Buford Dresser, MD PhD 01/26/2022 11:28 PM    [place]

## 2022-01-23 NOTE — Patient Instructions (Signed)
Medication Instructions:  Your physician recommends that you continue on your current medications as directed. Please refer to the Current Medication list given to you today.   *If you need a refill on your cardiac medications before your next appointment, please call your pharmacy*  Lab Work: NONE  Testing/Procedures: NONE   Follow-Up: At [facility], you and your health needs are our priority.  As part of our continuing mission to provide you with exceptional heart care, we have created designated Provider Care Teams.  These Care Teams include your primary Cardiologist (physician) and Advanced Practice Providers (APPs -  Physician Assistants and Nurse Practitioners) who all work together to provide you with the care you need, when you need it.  We recommend signing up for the patient portal called "MyChart".  Sign up information is provided on this After Visit Summary.  MyChart is used to connect with patients for Virtual Visits (Telemedicine).  Patients are able to view lab/test results, encounter notes, upcoming appointments, etc.  Non-urgent messages can be sent to your provider as well.   To learn more about what you can do with MyChart, go to [email or web address].    Your next appointment:   6 month(s)  The format for your next appointment:   In Person  Provider:   Bridgette Christopher, MD             

## 2022-01-26 ENCOUNTER — Encounter (HOSPITAL_BASED_OUTPATIENT_CLINIC_OR_DEPARTMENT_OTHER): Payer: Self-pay | Admitting: Cardiology

## 2022-03-15 ENCOUNTER — Emergency Department (HOSPITAL_COMMUNITY): Payer: Medicare PPO

## 2022-03-15 ENCOUNTER — Encounter (HOSPITAL_COMMUNITY): Payer: Self-pay

## 2022-03-15 ENCOUNTER — Emergency Department (HOSPITAL_COMMUNITY)
Admission: EM | Admit: 2022-03-15 | Discharge: 2022-03-15 | Disposition: A | Discharge status: Home / Self Care | Payer: Medicare PPO | Attending: Emergency Medicine | Admitting: Emergency Medicine

## 2022-03-15 ENCOUNTER — Other Ambulatory Visit: Payer: Self-pay

## 2022-03-15 DIAGNOSIS — Z794 Long term (current) use of insulin: Secondary | ICD-10-CM | POA: Diagnosis not present

## 2022-03-15 DIAGNOSIS — W19XXXD Unspecified fall, subsequent encounter: Secondary | ICD-10-CM

## 2022-03-15 DIAGNOSIS — E1122 Type 2 diabetes mellitus with diabetic chronic kidney disease: Secondary | ICD-10-CM | POA: Insufficient documentation

## 2022-03-15 DIAGNOSIS — Z7984 Long term (current) use of oral hypoglycemic drugs: Secondary | ICD-10-CM | POA: Diagnosis not present

## 2022-03-15 DIAGNOSIS — S82831A Other fracture of upper and lower end of right fibula, initial encounter for closed fracture: Secondary | ICD-10-CM | POA: Insufficient documentation

## 2022-03-15 DIAGNOSIS — W19XXXA Unspecified fall, initial encounter: Secondary | ICD-10-CM | POA: Diagnosis not present

## 2022-03-15 DIAGNOSIS — I129 Hypertensive chronic kidney disease with stage 1 through stage 4 chronic kidney disease, or unspecified chronic kidney disease: Secondary | ICD-10-CM | POA: Diagnosis not present

## 2022-03-15 DIAGNOSIS — M19071 Primary osteoarthritis, right ankle and foot: Secondary | ICD-10-CM | POA: Diagnosis not present

## 2022-03-15 DIAGNOSIS — F0390 Unspecified dementia without behavioral disturbance: Secondary | ICD-10-CM | POA: Insufficient documentation

## 2022-03-15 DIAGNOSIS — Z79899 Other long term (current) drug therapy: Secondary | ICD-10-CM | POA: Diagnosis not present

## 2022-03-15 DIAGNOSIS — N183 Chronic kidney disease, stage 3 unspecified: Secondary | ICD-10-CM | POA: Insufficient documentation

## 2022-03-15 DIAGNOSIS — N3000 Acute cystitis without hematuria: Secondary | ICD-10-CM | POA: Insufficient documentation

## 2022-03-15 DIAGNOSIS — S99911A Unspecified injury of right ankle, initial encounter: Secondary | ICD-10-CM | POA: Diagnosis present

## 2022-03-15 LAB — COMPREHENSIVE METABOLIC PANEL
ALT: 24 U/L (ref 0–44)
AST: 26 U/L (ref 15–41)
Albumin: 3.1 g/dL — ABNORMAL LOW (ref 3.5–5.0)
Alkaline Phosphatase: 58 U/L (ref 38–126)
Anion gap: 11 (ref 5–15)
BUN: 30 mg/dL — ABNORMAL HIGH (ref 8–23)
CO2: 23 mmol/L (ref 22–32)
Calcium: 8.3 mg/dL — ABNORMAL LOW (ref 8.9–10.3)
Chloride: 103 mmol/L (ref 98–111)
Creatinine, Ser: 1.69 mg/dL — ABNORMAL HIGH (ref 0.44–1.00)
GFR, Estimated: 29 mL/min — ABNORMAL LOW (ref >60)
Glucose, Bld: 185 mg/dL — ABNORMAL HIGH (ref 70–99)
Potassium: 4.4 mmol/L (ref 3.5–5.1)
Sodium: 137 mmol/L (ref 135–145)
Total Bilirubin: 1.2 mg/dL (ref 0.3–1.2)
Total Protein: 6.5 g/dL (ref 6.5–8.1)

## 2022-03-15 LAB — URINALYSIS, ROUTINE W REFLEX MICROSCOPIC
Bilirubin Urine: NEGATIVE
Glucose, UA: NEGATIVE mg/dL
Hgb urine dipstick: NEGATIVE
Ketones, ur: NEGATIVE mg/dL
Nitrite: NEGATIVE
Protein, ur: 100 mg/dL — AB
Specific Gravity, Urine: 1.016 (ref 1.005–1.030)
WBC, UA: >50 WBC/hpf — ABNORMAL HIGH (ref 0–5)
pH: 7 (ref 5.0–8.0)

## 2022-03-15 LAB — CBC WITH DIFFERENTIAL/PLATELET
Abs Immature Granulocytes: 0.03 10*3/uL (ref 0.00–0.07)
Basophils Absolute: 0 10*3/uL (ref 0.0–0.1)
Basophils Relative: 0 %
Eosinophils Absolute: 0.1 10*3/uL (ref 0.0–0.5)
Eosinophils Relative: 1 %
HCT: 35.5 % — ABNORMAL LOW (ref 36.0–46.0)
Hemoglobin: 11.8 g/dL — ABNORMAL LOW (ref 12.0–15.0)
Immature Granulocytes: 0 %
Lymphocytes Relative: 7 %
Lymphs Abs: 0.6 10*3/uL — ABNORMAL LOW (ref 0.7–4.0)
MCH: 31.1 pg (ref 26.0–34.0)
MCHC: 33.2 g/dL (ref 30.0–36.0)
MCV: 93.4 fL (ref 80.0–100.0)
Monocytes Absolute: 1 10*3/uL (ref 0.1–1.0)
Monocytes Relative: 11 %
Neutro Abs: 7.3 10*3/uL (ref 1.7–7.7)
Neutrophils Relative %: 81 %
Platelets: 178 10*3/uL (ref 150–400)
RBC: 3.8 MIL/uL — ABNORMAL LOW (ref 3.87–5.11)
RDW: 14.8 % (ref 11.5–15.5)
WBC: 9 10*3/uL (ref 4.0–10.5)
nRBC: 0 % (ref 0.0–0.2)

## 2022-03-15 LAB — TROPONIN I (HIGH SENSITIVITY)
Troponin I (High Sensitivity): 18 ng/L — ABNORMAL HIGH (ref <18)
Troponin I (High Sensitivity): 14 ng/L (ref <18)

## 2022-03-15 LAB — CK: Total CK: 131 U/L (ref 38–234)

## 2022-03-15 LAB — CBG MONITORING, ED: Glucose-Capillary: 187 mg/dL — ABNORMAL HIGH (ref 70–99)

## 2022-03-15 MED ORDER — HYDROCODONE-ACETAMINOPHEN 5-325 MG PO TABS: 1.0000 | ORAL_TABLET | Freq: Once | ORAL | Status: DC

## 2022-03-15 MED ORDER — CEPHALEXIN 500 MG PO CAPS: 500.0000 mg | ORAL_CAPSULE | Freq: Four times a day (QID) | ORAL | 0 refills | Status: AC

## 2022-03-15 NOTE — ED Provider Notes (Signed)
[facility] EMERGENCY DEPARTMENT Provider Note   CSN: [id number] Arrival date & time: 03/15/22  0931     History  Chief Complaint  Patient presents with   Diana [name]    Diana [name] is a 89 y.o. female. With past medical history of type 2 diabetes, hypertension, dementia, persistent atrial fibrillation, CKD III who presents to the emergency department with fall.  The history is provided by a caregiver of the patient. She is from [facility] which is independent living. States that she has a caregiver that comes every morning to get her dressed, breakfast, meds.  Yesterday morning, the caregiver states that they arrived to find her on the ground. It is unclear how she fell or if she hit her head.  States that EMS was called and she was placed back in her chair.  Today she states that she arrived and the patient seemed to be acting more confused than baseline.  She also noticed swelling to the right foot and ankle and that the patient was hesitant to put weight down on this.  The caregiver at bedside has been away for 1 week and there has been other caregivers taking care of the patient in her absence.  The patient is denying pain.   Fall       Home Medications Prior to Admission medications   Medication Sig Start Date End Date Taking? Authorizing Provider  amLODipine (NORVASC) 5 MG tablet Take 5 mg by mouth in the morning.   Yes [provider]  brimonidine-timolol (COMBIGAN) 0.2-0.5 % ophthalmic solution Place 1 drop into both eyes every 12 (twelve) hours. 05/26/20  Yes Medina-Vargas, Monina C, NP  cephALEXin (KEFLEX) 250 MG capsule Take 250 mg by mouth at bedtime. Prophylactic.   Yes [provider]  cephALEXin (KEFLEX) 500 MG capsule Take 1 capsule (500 mg total) by mouth 4 (four) times daily for 5 days. 03/15/22 03/20/22 Yes Mickie Hillier, PA-C  Cholecalciferol (VITAMIN D) 50 MCG (2000 UT) tablet Take 2,000 Units by mouth in the morning.   Yes  [provider]  Cranberry 500 MG CAPS Take 500 mg by mouth 2 (two) times daily.   Yes [provider]  ferrous sulfate 325 (65 FE) MG EC tablet Take 325 mg by mouth in the morning.   Yes [provider]  FLUoxetine (PROZAC) 40 MG capsule Take 1 capsule by mouth in the morning. 01/07/22  Yes [provider]  levothyroxine (SYNTHROID) 75 MCG tablet Take 1 tablet by mouth in the morning. 01/14/22  Yes [provider]  losartan (COZAAR) 50 MG tablet Take 1 tablet (50 mg total) by mouth 2 (two) times daily. 05/26/20  Yes Medina-Vargas, Monina C, NP  metFORMIN (GLUCOPHAGE) 500 MG tablet Take 500 mg by mouth 2 (two) times daily.   Yes [provider]  metoprolol succinate (TOPROL-XL) 25 MG 24 hr tablet Take 0.5 tablets (12.5 mg total) by mouth daily. Take with or immediately following a meal. Patient taking differently: Take 12.5 mg by mouth in the morning. 12/10/21 12/05/22 Yes Buford Dresser, MD  Multiple Vitamins-Minerals (PRESERVISION AREDS 2 PO) Take 1 capsule by mouth in the morning.   Yes [provider]  pantoprazole (PROTONIX) 40 MG tablet Take 1 tablet (40 mg total) by mouth 2 (two) times daily before a meal. 05/26/20  Yes Medina-Vargas, Monina C, NP  rosuvastatin (CRESTOR) 10 MG tablet Take 10 mg by mouth at bedtime.   Yes [provider]  furosemide (  LASIX) 20 MG tablet Take 1 tablet (20 mg total) by mouth daily as needed (shortness of breath, take for 5 days). Patient not taking: Reported on 03/15/2022 10/24/21 10/19/22  Buford Dresser, MD  insulin glargine (LANTUS) 100 UNIT/ML injection Inject 8 Units into the skin at bedtime. Patient not taking: Reported on 03/15/2022    [provider]  ondansetron (ZOFRAN-ODT) 4 MG disintegrating tablet Take 1 tablet (4 mg total) by mouth every 8 (eight) hours as needed for nausea or vomiting. Patient not taking: Reported on 03/15/2022 12/16/21   Smoot, Leary Roca, PA-C       Allergies    Codeine sulfate, Sps [sodium polystyrene sulfonate], and Sulfa antibiotics    Review of Systems   Review of Systems  Musculoskeletal:  Positive for arthralgias, gait problem and joint swelling.  Psychiatric/Behavioral:  Positive for confusion.   All other systems reviewed and are negative.   Physical Exam Updated Vital Signs BP (!) 147/76   Pulse 70   Temp 98 F (36.7 C)   Resp 17   LMP  (LMP Unknown)   SpO2 99%  Physical Exam Vitals and nursing note reviewed.  Constitutional:      General: She is not in acute distress.    Appearance: Normal appearance. She is obese. She is not ill-appearing.  HENT:     Head: Normocephalic and atraumatic.     Mouth/Throat:     Pharynx: Oropharynx is clear.  Eyes:     General: No scleral icterus.    Extraocular Movements: Extraocular movements intact.     Pupils: Pupils are equal, round, and reactive to light.  Cardiovascular:     Rate and Rhythm: Normal rate and regular rhythm.     Pulses: Normal pulses.     Heart sounds: No murmur heard. Pulmonary:     Effort: Pulmonary effort is normal. No respiratory distress.     Breath sounds: Normal breath sounds.  Abdominal:     General: Bowel sounds are normal. There is no distension.     Palpations: Abdomen is soft.     Tenderness: There is no abdominal tenderness.  Musculoskeletal:        General: Swelling, tenderness and signs of injury present.     Cervical back: Normal range of motion and neck supple. No tenderness.     Comments: No chest tenderness or crepitus to palpation. Hips bilaterally with full ROM, no TTP.  Bilaterally knees with full ROM. No swelling. No tenderness. Rolled with no c-t-l spine ttp, no step offs or deformities Right ankle and foot is ecchymotic, swollen. DP pulse palpable. TTP.    Skin:    General: Skin is warm and dry.     Capillary Refill: Capillary refill takes less than 2 seconds.  Neurological:     General: No focal deficit present.      Mental Status: She is alert.     ED Results / Procedures / Treatments   Labs (all labs ordered are listed, but only abnormal results are displayed) Labs Reviewed  COMPREHENSIVE METABOLIC PANEL - Abnormal; Notable for the following components:      Result Value   Glucose, Bld 185 (*)    BUN 30 (*)    Creatinine, Ser 1.69 (*)    Calcium 8.3 (*)    Albumin 3.1 (*)    GFR, Estimated 29 (*)    All other components within normal limits  CBC WITH DIFFERENTIAL/PLATELET - Abnormal; Notable for the following components:   RBC 3.80 (*)  Hemoglobin 11.8 (*)    HCT 35.5 (*)    Lymphs Abs 0.6 (*)    All other components within normal limits  URINALYSIS, ROUTINE W REFLEX MICROSCOPIC - Abnormal; Notable for the following components:   APPearance HAZY (*)    Protein, ur 100 (*)    Leukocytes,Ua LARGE (*)    WBC, UA >50 (*)    Bacteria, UA MANY (*)    All other components within normal limits  CBG MONITORING, ED - Abnormal; Notable for the following components:   Glucose-Capillary 187 (*)    All other components within normal limits  TROPONIN I (HIGH SENSITIVITY) - Abnormal; Notable for the following components:   Troponin I (High Sensitivity) 18 (*)    All other components within normal limits  CK  TROPONIN I (HIGH SENSITIVITY)    EKG None  Radiology CT Head Wo Contrast  Result Date: 03/15/2022 CLINICAL DATA:  Unwitnessed fall. EXAM: CT HEAD WITHOUT CONTRAST CT CERVICAL SPINE WITHOUT CONTRAST TECHNIQUE: Multidetector CT imaging of the head and cervical spine was performed following the standard protocol without intravenous contrast. Multiplanar CT image reconstructions of the cervical spine were also generated. RADIATION DOSE REDUCTION: This exam was performed according to the departmental dose-optimization program which includes automated exposure control, adjustment of the mA and/or kV according to patient size and/or use of iterative reconstruction technique. COMPARISON:   10/05/2021. FINDINGS: CT HEAD FINDINGS Brain: No evidence of acute infarction, hemorrhage, hydrocephalus, extra-axial collection or mass lesion/mass effect. Remote lacunar infarct is noted in the right basal ganglia There is moderate diffuse low-attenuation within the subcortical and periventricular white matter compatible with chronic microvascular disease. Prominence of sulci and ventricles. Vascular: No hyperdense vessel or unexpected calcification. Skull: Normal. Negative for fracture or focal lesion. Sinuses/Orbits: The paranasal sinuses are clear. Right mastoid air cell effusion appears similar to the previous exam. Other: None. CT CERVICAL SPINE FINDINGS Alignment: Normal. Skull base and vertebrae: No acute fracture. No primary bone lesion or focal pathologic process. Soft tissues and spinal canal: No prevertebral fluid or swelling. No visible canal hematoma. Disc levels: Multilevel disc space narrowing and endplate spurring. Most severe at C5-6 and C6-7. Upper chest: Negative. Other: None IMPRESSION: 1. No acute intracranial abnormality. 2. Chronic small vessel ischemic disease and brain atrophy. 3. No evidence for cervical spine fracture or subluxation. 4. Cervical degenerative disc disease. Electronically Signed   By: Kerby Moors M.D.   On: 03/15/2022 12:12   CT Cervical Spine Wo Contrast  Result Date: 03/15/2022 CLINICAL DATA:  Unwitnessed fall. EXAM: CT HEAD WITHOUT CONTRAST CT CERVICAL SPINE WITHOUT CONTRAST TECHNIQUE: Multidetector CT imaging of the head and cervical spine was performed following the standard protocol without intravenous contrast. Multiplanar CT image reconstructions of the cervical spine were also generated. RADIATION DOSE REDUCTION: This exam was performed according to the departmental dose-optimization program which includes automated exposure control, adjustment of the mA and/or kV according to patient size and/or use of iterative reconstruction technique. COMPARISON:   10/05/2021. FINDINGS: CT HEAD FINDINGS Brain: No evidence of acute infarction, hemorrhage, hydrocephalus, extra-axial collection or mass lesion/mass effect. Remote lacunar infarct is noted in the right basal ganglia There is moderate diffuse low-attenuation within the subcortical and periventricular white matter compatible with chronic microvascular disease. Prominence of sulci and ventricles. Vascular: No hyperdense vessel or unexpected calcification. Skull: Normal. Negative for fracture or focal lesion. Sinuses/Orbits: The paranasal sinuses are clear. Right mastoid air cell effusion appears similar to the previous exam. Other: None. CT CERVICAL  SPINE FINDINGS Alignment: Normal. Skull base and vertebrae: No acute fracture. No primary bone lesion or focal pathologic process. Soft tissues and spinal canal: No prevertebral fluid or swelling. No visible canal hematoma. Disc levels: Multilevel disc space narrowing and endplate spurring. Most severe at C5-6 and C6-7. Upper chest: Negative. Other: None IMPRESSION: 1. No acute intracranial abnormality. 2. Chronic small vessel ischemic disease and brain atrophy. 3. No evidence for cervical spine fracture or subluxation. 4. Cervical degenerative disc disease. Electronically Signed   By: Kerby Moors M.D.   On: 03/15/2022 12:12   DG Ankle Complete Right  Result Date: 03/15/2022 CLINICAL DATA:  Fall. EXAM: RIGHT ANKLE - COMPLETE 3+ VIEW COMPARISON:  None Available. FINDINGS: Lucency over the fibula on the lateral view, not seen on the other two views. Prominent soft tissue swelling. Generalized osteopenia. IMPRESSION: 1. Equivocal for nondisplaced distal fibular fracture on the lateral view, consider follow-up radiographs versus CT. 2. Prominent soft tissue swelling. Electronically Signed   By: Jorje Guild M.D.   On: 03/15/2022 10:26   DG Foot Complete Right  Result Date: 03/15/2022 CLINICAL DATA:  Fall, foot and ankle pain EXAM: RIGHT FOOT COMPLETE - 3+ VIEW  COMPARISON:  Concurrently obtained radiographs of the ankle FINDINGS: Extensive soft tissue swelling about the ankle. No acute fracture is visualized. Degenerative changes are present in the midfoot at the talonavicular joint, as well as in the interphalangeal joint of the great toe. The bones appear demineralized. IMPRESSION: 1. No evidence of acute fracture. 2. Extensive soft tissue swelling about the ankle. 3. 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 Electronically Signed   By: Jacqulynn Cadet M.D.   On: 03/15/2022 10:25    Procedures Procedures   Medications Ordered in ED Medications - No data to display  ED Course/ Medical Decision Making/ A&P                           Medical Decision Making Amount and/or Complexity of Data Reviewed Labs: ordered. Radiology: ordered.  Risk Prescription drug management.  Initial Impression and Ddx 89 year old female who presents to the emergency department from independent living with fall and increased confusion per caregiver.  She is overall well-appearing, nonseptic and nontoxic in appearance.  She has dementia at baseline.  She does have significant edema and ecchymosis over the right foot and ankle.  Will obtain CT head and C-spine as this was a unwitnessed fall.  Labs, urine, EKG, plain film of the right foot and ankle. Patient PMH that increases complexity of ED encounter: Dementia, type 2 diabetes, hypertension, persistent atrial fibrillation, CKD  Interpretation of Diagnostics I independent reviewed and interpreted the labs as followed: CMP with stable creatinine, no electrolyte dysfunction.  CBC without acute anemia.  CK within normal limits.  Troponin negative.  UA consistent with UTI.  - I independently visualized the following imaging with scope of interpretation limited to determining acute life threatening conditions related to emergency care: CT head and C-spine,  which revealed no acute abnormalities.  Plain film of the right ankle shows a nondisplaced fibular fracture  Patient Reassessment and Ultimate Disposition/Management On my reassessment the patient is stable.  No acute changes while here in the emergency department. CT head and C-spine are negative for acute injuries or infarct or bleed. EKG without new ischemia or infarction, troponin x 1 is negative.  Doubt ACS as a component of her fall.  Unknown downtime but CK is less than normal limits. CMP without acute electrolyte derangement and, AKI, transaminitis. CBC without acute anemia. Glucose is normal UA does show UTI.  Urine culture was sent. Plain film of the right ankle shows an equivocal nondisplaced distal fracture of the fibula.  She was placed in a cam boot.  Uses a walker at baseline.  There is a caregiver at bedside.  I discussed the findings with her.  Discussed that she can bear weight and use her walker with the cam boot.  Additionally will prescribe her Keflex over the next week for UTI.  Likely think that her change in confusion is related to acute UTI.  She does have frequent falls.  I do not find any acute reason for her fall at this time.  Will discharge back to [place].  I talked with the caregiver.  The patient receives visit from caregiver at facility about every 2 hours throughout the day.  Discussed that this is appropriate.  Will have her return precautions for worsening symptoms, repeated follow-up, worsening confusion.  Otherwise feel that she is safe for discharge at this time.  I discussed this case with my attending physician who cosigned this note including patient's presenting symptoms, physical exam, and planned diagnostics and interventions. Attending physician stated agreement with plan or made changes to plan which were implemented.   Attending physician assessed patient at bedside.    The patient has been appropriately medically screened and/or stabilized in  the ED. I have low suspicion for any other emergent medical condition which would require further screening, evaluation or treatment in the ED or require inpatient management. At time of discharge the patient is hemodynamically stable and in no acute distress. I have discussed work-up results and diagnosis with patient and answered all questions. Patient is agreeable with discharge plan. We discussed strict return precautions for returning to the emergency department and they verbalized understanding.      Patient management required discussion with the following services or consulting groups:  None  Complexity of Problems Addressed Acute complicated illness or Injury  Additional Data Reviewed and Analyzed Further history obtained from: Further history from spouse/family member, Past medical history and medications listed in the EMR, Prior ED visit notes, Recent discharge summary, Care Everywhere, and Prior labs/imaging results  Patient Encounter Risk Assessment Prescriptions and Consideration of hospitalization  Final Clinical Impression(s) / ED Diagnoses Final diagnoses:  Fall, subsequent encounter  Acute cystitis without hematuria  Closed fracture of distal end of right fibula, unspecified fracture morphology, initial encounter    Rx / DC Orders ED Discharge Orders          Ordered    cephALEXin (KEFLEX) 500 MG capsule  4 times daily        03/15/22 1435              Mickie Hillier, PA-C 03/15/22 1442    Pattricia Boss, MD 03/15/22 1626

## 2022-03-15 NOTE — ED Triage Notes (Signed)
Pt BIB[facility]EMS from [facility] for unwitnessed fall yesterday. Unknown head strike and unknown blood thinners. Pt has no memory of the fall. Swelling to the right knee and right ankle. Pt unable to inform writer where the pain is located.   No shortening or rotation noted  Hx dementia pt acting at baseline  Taken off insulin last week.  CBG 228 HR 88

## 2022-03-15 NOTE — Progress Notes (Signed)
Orthopedic Tech Progress Note Patient Details:  Diana [name] 09-06-1933 [id number]  CAM boot placed on RLE. Caregiver is also at bedside and understands how to remove/apply/adjust boot.  Ortho Devices Type of Ortho Device: CAM walker Ortho Device/Splint Location: RLE Ortho Device/Splint Interventions: Ordered, Application, Adjustment   Post Interventions Patient Tolerated: Well Instructions Provided: Care of device, Adjustment of device  Jahari Wiginton Jeri Modena 03/15/2022, 1:44 PM

## 2022-03-15 NOTE — ED Provider Notes (Signed)
89 yo female ho dementia in independent living found on floor yesterday, today with increased swelling to right ankle and does not appear at baseline mental status. Plan evaluation here with ct head and neck pending Nondisplace fibular fx Labs pending.     Diana Boss, MD 03/15/22 [phone]

## 2022-03-15 NOTE — Discharge Instructions (Addendum)
You were seen in the emergency department today for a fall.  You do have a UTI.  We are placing you on increased dose of Keflex that you will take 4 times a day over the next 5 days.  Additionally have a fracture of the lower leg on the right side.  We placed you in a boot.  He can bear weight and use your walker still.  We will have you follow-up with an orthopedic in the next 2 weeks.  I placed her information in your discharge paperwork.  Please call schedule follow-up appointment.  Please return to the emergency department for any worsening symptoms.

## 2022-03-20 LAB — URINE CULTURE: Culture: >=100000 — AB

## 2022-03-21 ENCOUNTER — Telehealth (HOSPITAL_BASED_OUTPATIENT_CLINIC_OR_DEPARTMENT_OTHER): Payer: Self-pay

## 2022-03-21 NOTE — Progress Notes (Signed)
ED Antimicrobial Stewardship Positive Culture Follow Up   Diana [name] is an 89 y.o. female who presented to [facility] on 03/15/2022 with a chief complaint of  Chief Complaint  Patient presents with   Fall    Recent Results (from the past 24 hour(s))  Urine Culture     Status: Abnormal   Collection Time: 03/15/22  2:41 PM   Specimen: Urine, Clean Catch  Result Value Ref Range Status   Specimen Description URINE, CLEAN CATCH  Final   Special Requests   Final    NONE Performed at [facility], [address] [place], [place] [address]    Culture (A)  Final    >=100,000 COLONIES/mL MORGANELLA MORGANII 50,000 COLONIES/mL CITROBACTER BRAAKII    Report Status 03/20/2022 FINAL  Final   Organism ID, Bacteria MORGANELLA MORGANII (A)  Final   Organism ID, Bacteria CITROBACTER BRAAKII (A)  Final      Susceptibility   Citrobacter braakii - MIC*    CEFAZOLIN >=64 RESISTANT Resistant     CEFEPIME 0.5 SENSITIVE Sensitive     CEFTRIAXONE >=64 RESISTANT Resistant     CIPROFLOXACIN <=0.25 SENSITIVE Sensitive     GENTAMICIN <=1 SENSITIVE Sensitive     IMIPENEM 2 SENSITIVE Sensitive     NITROFURANTOIN >=512 RESISTANT Resistant     TRIMETH/SULFA <=20 SENSITIVE Sensitive     PIP/TAZO 64 INTERMEDIATE Intermediate     * 50,000 COLONIES/mL CITROBACTER BRAAKII   Morganella morganii - MIC*    AMPICILLIN >=32 RESISTANT Resistant     CEFAZOLIN >=64 RESISTANT Resistant     CIPROFLOXACIN <=0.25 SENSITIVE Sensitive     GENTAMICIN <=1 SENSITIVE Sensitive     IMIPENEM 2 SENSITIVE Sensitive     NITROFURANTOIN 128 RESISTANT Resistant     TRIMETH/SULFA <=20 SENSITIVE Sensitive     AMPICILLIN/SULBACTAM >=32 RESISTANT Resistant     PIP/TAZO 64 INTERMEDIATE Intermediate     * >=100,000 COLONIES/mL MORGANELLA MORGANII    '[x]'$  Treated with cephalexin, organism resistant to prescribed antimicrobial  New antibiotic prescription: ciprofloxacin '500mg'$  by mouth every 24 hours x 3 days  ED  Provider: Dion Saucier, PA-C   Kaleen Mask, PharmD, BCPS 03/21/2022, 8:45 AM Clinical Pharmacist Monday - Friday phone -  [phone] Saturday - Sunday phone - [phone]

## 2022-03-21 NOTE — Telephone Encounter (Signed)
Post ED Visit - Positive Culture Follow-up: Unsuccessful Patient Follow-up  Culture assessed and recommendations reviewed by:  '[]'$  Elenor Quinones, Pharm.D. '[]'$  Heide Guile, Pharm.D., BCPS AQ-ID '[x]'$  Luisa Hart, Pharm.D., BCPS '[]'$  Alycia Rossetti, Pharm.D., BCPS '[]'$  [place], [place].D., BCPS, AAHIVP '[]'$  Legrand Como, Pharm.D., BCPS, AAHIVP '[]'$  Wynell Balloon, PharmD '[]'$  Vincenza Hews, PharmD, BCPS  Positive urine culture  '[]'$  Patient discharged without antimicrobial prescription and treatment is now indicated '[x]'$  Organism is resistant to prescribed ED discharge antimicrobial '[]'$  Patient with positive blood cultures   Left message with [facility] for call back about urine cx.   Plan Stop Cephalexin, Start Ciprofloxacin 500 mg po daily for 3 days per ED provider Dion Saucier, PA-C   [place], [place] 03/21/2022, 2:06 PM

## 2022-03-21 NOTE — Telephone Encounter (Signed)
Post ED Visit - Positive Culture Follow-up: Unsuccessful Patient Follow-up  Culture assessed and recommendations reviewed by:  '[]'$  Elenor Quinones, Pharm.D. '[]'$  Heide Guile, Pharm.D., BCPS AQ-ID '[]'$  Parks Neptune, Pharm.D., BCPS '[]'$  Alycia Rossetti, Pharm.D., BCPS '[]'$  [place], Pharm.D., BCPS, AAHIVP '[]'$  Legrand Como, Pharm.D., BCPS, AAHIVP '[]'$  Wynell Balloon, PharmD '[]'$  Vincenza Hews, PharmD, BCPS  Positive urine culture  '[]'$  Patient discharged without antimicrobial prescription and treatment is now indicated '[x]'$  Organism is resistant to prescribed ED discharge antimicrobial '[]'$  Patient with positive blood cultures  Faxed urine culture results to [facility] with medication changes  . Plan Stop Cephalexin, Start Ciprofloxacin 500 mg po daily for 3 days per ED provider Dion Saucier, PA-C    Unable to contact patient after 3 attempts, letter will be sent to address on file  Glennon Hamilton 03/21/2022, 2:11 PM

## 2022-05-16 ENCOUNTER — Emergency Department (HOSPITAL_COMMUNITY)
Admission: EM | Admit: 2022-05-16 | Discharge: 2022-05-17 | Disposition: A | Discharge status: Home / Self Care | Payer: Medicare PPO | Attending: Emergency Medicine | Admitting: Emergency Medicine

## 2022-05-16 ENCOUNTER — Emergency Department (HOSPITAL_COMMUNITY): Payer: Medicare PPO

## 2022-05-16 ENCOUNTER — Other Ambulatory Visit: Payer: Self-pay

## 2022-05-16 DIAGNOSIS — K529 Noninfective gastroenteritis and colitis, unspecified: Secondary | ICD-10-CM

## 2022-05-16 DIAGNOSIS — K625 Hemorrhage of anus and rectum: Secondary | ICD-10-CM | POA: Diagnosis present

## 2022-05-16 DIAGNOSIS — E039 Hypothyroidism, unspecified: Secondary | ICD-10-CM | POA: Insufficient documentation

## 2022-05-16 DIAGNOSIS — Z79899 Other long term (current) drug therapy: Secondary | ICD-10-CM | POA: Diagnosis not present

## 2022-05-16 DIAGNOSIS — Z794 Long term (current) use of insulin: Secondary | ICD-10-CM | POA: Diagnosis not present

## 2022-05-16 DIAGNOSIS — E119 Type 2 diabetes mellitus without complications: Secondary | ICD-10-CM | POA: Insufficient documentation

## 2022-05-16 DIAGNOSIS — F0390 Unspecified dementia without behavioral disturbance: Secondary | ICD-10-CM | POA: Insufficient documentation

## 2022-05-16 DIAGNOSIS — R197 Diarrhea, unspecified: Secondary | ICD-10-CM

## 2022-05-16 DIAGNOSIS — Z79890 Hormone replacement therapy (postmenopausal): Secondary | ICD-10-CM | POA: Insufficient documentation

## 2022-05-16 DIAGNOSIS — I10 Essential (primary) hypertension: Secondary | ICD-10-CM | POA: Diagnosis not present

## 2022-05-16 DIAGNOSIS — Z7984 Long term (current) use of oral hypoglycemic drugs: Secondary | ICD-10-CM | POA: Insufficient documentation

## 2022-05-16 LAB — I-STAT CHEM 8, ED
BUN: 29 mg/dL — ABNORMAL HIGH (ref 8–23)
Calcium, Ion: 1.17 mmol/L (ref 1.15–1.40)
Chloride: 108 mmol/L (ref 98–111)
Creatinine, Ser: 1.9 mg/dL — ABNORMAL HIGH (ref 0.44–1.00)
Glucose, Bld: 150 mg/dL — ABNORMAL HIGH (ref 70–99)
HCT: 35 % — ABNORMAL LOW (ref 36.0–46.0)
Hemoglobin: 11.9 g/dL — ABNORMAL LOW (ref 12.0–15.0)
Potassium: 3.3 mmol/L — ABNORMAL LOW (ref 3.5–5.1)
Sodium: 142 mmol/L (ref 135–145)
TCO2: 21 mmol/L — ABNORMAL LOW (ref 22–32)

## 2022-05-16 LAB — CBC WITH DIFFERENTIAL/PLATELET
Abs Immature Granulocytes: 0.01 10*3/uL (ref 0.00–0.07)
Basophils Absolute: 0 10*3/uL (ref 0.0–0.1)
Basophils Relative: 1 %
Eosinophils Absolute: 0.3 10*3/uL (ref 0.0–0.5)
Eosinophils Relative: 4 %
HCT: 35.5 % — ABNORMAL LOW (ref 36.0–46.0)
Hemoglobin: 11.4 g/dL — ABNORMAL LOW (ref 12.0–15.0)
Immature Granulocytes: 0 %
Lymphocytes Relative: 14 %
Lymphs Abs: 0.9 10*3/uL (ref 0.7–4.0)
MCH: 30.5 pg (ref 26.0–34.0)
MCHC: 32.1 g/dL (ref 30.0–36.0)
MCV: 94.9 fL (ref 80.0–100.0)
Monocytes Absolute: 1 10*3/uL (ref 0.1–1.0)
Monocytes Relative: 15 %
Neutro Abs: 4.4 10*3/uL (ref 1.7–7.7)
Neutrophils Relative %: 66 %
Platelets: 211 10*3/uL (ref 150–400)
RBC: 3.74 MIL/uL — ABNORMAL LOW (ref 3.87–5.11)
RDW: 15.8 % — ABNORMAL HIGH (ref 11.5–15.5)
WBC: 6.6 10*3/uL (ref 4.0–10.5)
nRBC: 0 % (ref 0.0–0.2)

## 2022-05-16 LAB — COMPREHENSIVE METABOLIC PANEL
ALT: 13 U/L (ref 0–44)
AST: 16 U/L (ref 15–41)
Albumin: 2.4 g/dL — ABNORMAL LOW (ref 3.5–5.0)
Alkaline Phosphatase: 54 U/L (ref 38–126)
Anion gap: 5 (ref 5–15)
BUN: 28 mg/dL — ABNORMAL HIGH (ref 8–23)
CO2: 24 mmol/L (ref 22–32)
Calcium: 8.6 mg/dL — ABNORMAL LOW (ref 8.9–10.3)
Chloride: 110 mmol/L (ref 98–111)
Creatinine, Ser: 1.74 mg/dL — ABNORMAL HIGH (ref 0.44–1.00)
GFR, Estimated: 28 mL/min — ABNORMAL LOW (ref >60)
Glucose, Bld: 153 mg/dL — ABNORMAL HIGH (ref 70–99)
Potassium: 3.3 mmol/L — ABNORMAL LOW (ref 3.5–5.1)
Sodium: 139 mmol/L (ref 135–145)
Total Bilirubin: 0.9 mg/dL (ref 0.3–1.2)
Total Protein: 5.9 g/dL — ABNORMAL LOW (ref 6.5–8.1)

## 2022-05-16 LAB — POC OCCULT BLOOD, ED: Fecal Occult Bld: POSITIVE — AB

## 2022-05-16 MED ORDER — SODIUM CHLORIDE 0.9 % IV SOLN
2.0000 g | Freq: Once | INTRAVENOUS | Status: AC
  Administered 2022-05-16: 2 g via INTRAVENOUS
  Filled 2022-05-16: qty 20

## 2022-05-16 MED ORDER — LACTATED RINGERS IV BOLUS
1000.0000 mL | Freq: Once | INTRAVENOUS | Status: AC
  Administered 2022-05-16: 1000 mL via INTRAVENOUS

## 2022-05-16 MED ORDER — AMOXICILLIN-POT CLAVULANATE 875-125 MG PO TABS: 1.0000 | ORAL_TABLET | Freq: Two times a day (BID) | ORAL | 0 refills | Status: DC

## 2022-05-16 MED ORDER — METRONIDAZOLE 500 MG/100ML IV SOLN
500.0000 mg | Freq: Once | INTRAVENOUS | Status: AC
  Administered 2022-05-16: 500 mg via INTRAVENOUS
  Filled 2022-05-16: qty 100

## 2022-05-16 MED ORDER — LACTATED RINGERS IV BOLUS: 1000.0000 mL | Freq: Once | INTRAVENOUS | Status: DC

## 2022-05-16 NOTE — ED Notes (Signed)
This RN provided peri-care, changed patient's brief and applied new linens.

## 2022-05-16 NOTE — Discharge Instructions (Addendum)
Lab work today looks about the same as her baseline but her CAT scan showed that she has some inflammation of her colon which is most likely the cause of the diarrhea.  She was given a dose of antibiotics here and a prescription for antibiotics for the next 10 days that she takes 1 tablet twice a day.  She does need to see her doctor on Tuesday as planned to have her blood counts rechecked.  If over the weekend she starts having significant and some amount of blood in her stool, high fever, vomiting, change in mental status return to the emergency room.  While she is on the antibiotic she should hold her nighttime dose of Keflex until she completes the antibiotic.

## 2022-05-16 NOTE — ED Triage Notes (Addendum)
Pt BIB EMS from SNF. Per EMS, pt has had diarrhea for 5 days with bloody stool for 3 of those days. Pt c/o lower abd tenderness/pain upon palpation. Pt has dementia @ baseline.

## 2022-05-16 NOTE — ED Provider Notes (Signed)
[facility] Provider Note   CSN: [id number] Arrival date & time: 05/16/22  [id number]     History  Chief Complaint  Patient presents with   Rectal Bleeding    Diana [name] is a 89 y.o. female.  Patient is an 89 year old female with a history of dementia, hypertension, hyperlipidemia, anemia, hypothyroidism, GERD, diabetes, renal insufficiency, atrial fibrillation who does not take anticoagulation presenting today with concern for GI bleeding.  EMS reported that patient has had loose stool for the last 5 days and the last 3 days the facility has noticed blood in the stool.  Patient is also complaining of some abdominal tenderness with palpation.  There has been no vomiting, cough, shortness of breath.  Patient's caregiver arrived and reports that for the last several months patient has had waxing and waning good days and bad days.  Diarrhea has been intermittent and she did see her PCP and 2 months ago was discontinued on her metformin to see if that would help.  She is also in the last 2 weeks started a new antidepressant.  She has not been eating well for 6 months but there is been no acute changes in the last few days.  Sometimes patient will get up and get out of bed and other days that she is in bed all day long.  Mentation has been the same per her caregiver.  The history is provided by the EMS personnel, the patient and medical records. The history is limited by the absence of a caregiver.  Rectal Bleeding      Home Medications Prior to Admission medications   Medication Sig Start Date End Date Taking? Authorizing Provider  amoxicillin-clavulanate (AUGMENTIN) 875-125 MG tablet Take 1 tablet by mouth every 12 (twelve) hours. 05/16/22  Yes Solimar Maiden, Loree Fee, MD  amLODipine (NORVASC) 5 MG tablet Take 5 mg by mouth in the morning.    [provider]  brimonidine-timolol (COMBIGAN) 0.2-0.5 % ophthalmic solution Place 1 drop into both  eyes every 12 (twelve) hours. 05/26/20   Medina-Vargas, Monina C, NP  cephALEXin (KEFLEX) 250 MG capsule Take 250 mg by mouth at bedtime. Prophylactic.    [provider]  Cholecalciferol (VITAMIN D) 50 MCG (2000 UT) tablet Take 2,000 Units by mouth in the morning.    [provider]  Cranberry 500 MG CAPS Take 500 mg by mouth 2 (two) times daily.    [provider]  ferrous sulfate 325 (65 FE) MG EC tablet Take 325 mg by mouth in the morning.    [provider]  FLUoxetine (PROZAC) 40 MG capsule Take 1 capsule by mouth in the morning. 01/07/22   [provider]  furosemide (LASIX) 20 MG tablet Take 1 tablet (20 mg total) by mouth daily as needed (shortness of breath, take for 5 days). Patient not taking: Reported on 03/15/2022 10/24/21 10/19/22  Buford Dresser, MD  insulin glargine (LANTUS) 100 UNIT/ML injection Inject 8 Units into the skin at bedtime. Patient not taking: Reported on 03/15/2022    [provider]  levothyroxine (SYNTHROID) 75 MCG tablet Take 1 tablet by mouth in the morning. 01/14/22   [provider]  losartan (COZAAR) 50 MG tablet Take 1 tablet (50 mg total) by mouth 2 (two) times daily. 05/26/20   Medina-Vargas, Monina C, NP  metFORMIN (GLUCOPHAGE) 500 MG tablet Take 500 mg by mouth 2 (two) times daily.    [provider]  metoprolol succinate (TOPROL-XL) 25 MG  24 hr tablet Take 0.5 tablets (12.5 mg total) by mouth daily. Take with or immediately following a meal. Patient taking differently: Take 12.5 mg by mouth in the morning. 12/10/21 12/05/22  Buford Dresser, MD  Multiple Vitamins-Minerals (PRESERVISION AREDS 2 PO) Take 1 capsule by mouth in the morning.    [provider]  ondansetron (ZOFRAN-ODT) 4 MG disintegrating tablet Take 1 tablet (4 mg total) by mouth every 8 (eight) hours as needed for nausea or vomiting. Patient not taking: Reported on 03/15/2022 12/16/21   Smoot, Leary Roca, PA-C   pantoprazole (PROTONIX) 40 MG tablet Take 1 tablet (40 mg total) by mouth 2 (two) times daily before a meal. 05/26/20   Medina-Vargas, Monina C, NP  rosuvastatin (CRESTOR) 10 MG tablet Take 10 mg by mouth at bedtime.    [provider]      Allergies    Codeine sulfate, Sps [sodium polystyrene sulfonate], and Sulfa antibiotics    Review of Systems   Review of Systems  Gastrointestinal:  Positive for hematochezia.    Physical Exam Updated Vital Signs BP 124/68   Pulse 81   Temp 98.1 F (36.7 C) (Oral)   Resp 18   LMP  (LMP Unknown)   SpO2 96%  Physical Exam Vitals and nursing note reviewed.  Constitutional:      General: She is not in acute distress.    Appearance: She is well-developed.     Comments: Patient is awake alert smiling on exam  HENT:     Head: Normocephalic and atraumatic.  Eyes:     Pupils: Pupils are equal, round, and reactive to light.  Cardiovascular:     Rate and Rhythm: Normal rate and regular rhythm.     Heart sounds: Normal heart sounds. No murmur heard.    No friction rub.  Pulmonary:     Effort: Pulmonary effort is normal.     Breath sounds: Normal breath sounds. No wheezing or rales.  Abdominal:     General: Bowel sounds are normal. There is no distension.     Palpations: Abdomen is soft.     Tenderness: There is abdominal tenderness in the suprapubic area. There is no guarding or rebound.  Genitourinary:    Comments: No hemorrhoids noted.  Stool is a black-brown color with no obvious blood Musculoskeletal:        General: No tenderness. Normal range of motion.     Comments: No edema  Skin:    General: Skin is warm and dry.     Findings: No rash.  Neurological:     Mental Status: She is alert. Mental status is at baseline.     Cranial Nerves: No cranial nerve deficit.     Comments: Moving all extremities without difficulty  Psychiatric:        Behavior: Behavior normal.     ED Results / Procedures / Treatments   Labs (all  labs ordered are listed, but only abnormal results are displayed) Labs Reviewed  CBC WITH DIFFERENTIAL/PLATELET - Abnormal; Notable for the following components:      Result Value   RBC 3.74 (*)    Hemoglobin 11.4 (*)    HCT 35.5 (*)    RDW 15.8 (*)    All other components within normal limits  COMPREHENSIVE METABOLIC PANEL - Abnormal; Notable for the following components:   Potassium 3.3 (*)    Glucose, Bld 153 (*)    BUN 28 (*)    Creatinine, Ser 1.74 (*)  Calcium 8.6 (*)    Total Protein 5.9 (*)    Albumin 2.4 (*)    GFR, Estimated 28 (*)    All other components within normal limits  POC OCCULT BLOOD, ED - Abnormal; Notable for the following components:   Fecal Occult Bld POSITIVE (*)    All other components within normal limits  I-STAT CHEM 8, ED - Abnormal; Notable for the following components:   Potassium 3.3 (*)    BUN 29 (*)    Creatinine, Ser 1.90 (*)    Glucose, Bld 150 (*)    TCO2 21 (*)    Hemoglobin 11.9 (*)    HCT 35.0 (*)    All other components within normal limits  URINALYSIS, ROUTINE W REFLEX MICROSCOPIC    EKG EKG Interpretation  Date/Time:  Friday May 16 2022 18:36:48 EST Ventricular Rate:  79 PR Interval:  179 QRS Duration: 97 QT Interval:  588 QTC Calculation: 675 R Axis:   -68 Text Interpretation: Sinus rhythm Left anterior fascicular block Abnormal R-wave progression, late transition Repol abnrm suggests ischemia, diffuse leads Prolonged QT interval Atrial fibrillation RESOLVED SINCE PREVIOUS Confirmed by Blanchie Dessert [phone]) on 05/16/2022 6:42:19 PM  Radiology CT ABDOMEN PELVIS WO CONTRAST  Result Date: 05/16/2022 CLINICAL DATA:  Abdominal pain, acute, nonlocalized EXAM: CT ABDOMEN AND PELVIS WITHOUT CONTRAST TECHNIQUE: Multidetector CT imaging of the abdomen and pelvis was performed following the standard protocol without IV contrast. RADIATION DOSE REDUCTION: This exam was performed according to the departmental dose-optimization  program which includes automated exposure control, adjustment of the mA and/or kV according to patient size and/or use of iterative reconstruction technique. COMPARISON:  CT abdomen pelvis 12/16/2021 FINDINGS: Lower chest: Bilateral trace pleural effusions. Large hiatal hernia containing the majority of the stomach. Hepatobiliary: No focal liver abnormality. Status post cholecystectomy. No biliary dilatation. Pancreas: Diffusely atrophic. No focal lesion. Otherwise normal pancreatic contour. No surrounding inflammatory changes. No main pancreatic ductal dilatation. Spleen: Normal in size without focal abnormality. Adrenals/Urinary Tract: No adrenal nodule bilaterally. No nephrolithiasis and no hydronephrosis. Bilateral renal cortical scarring. Fluid density lesions likely represent simple renal cysts. Some of these contain punctate mural calcifications-considered minimally complex. Simple or minimally complex renal cysts, in the absence of clinically indicated signs/symptoms, require no independent follow-up. Subcentimeter hypodensities are too small to characterize-no further evaluation. A 1 cm left renal hypodense nodule with a density of 64 Hounsfield units (3:35). No ureterolithiasis or hydroureter. The urinary bladder is unremarkable. Stomach/Bowel: Stomach is within normal limits. No evidence of bowel wall thickening or dilatation. Colonic diverticulosis with rectosigmoid pericolonic fat stranding. The appendix is not definitely identified with no inflammatory changes in the right lower quadrant to suggest acute appendicitis. Vascular/Lymphatic: No abdominal aorta or iliac aneurysm. Severe atherosclerotic plaque of the aorta and its branches. No abdominal, pelvic, or inguinal lymphadenopathy. Reproductive: Status post hysterectomy. No adnexal masses. Other: No intraperitoneal free fluid. No intraperitoneal free gas. No organized fluid collection. Musculoskeletal: No abdominal wall hernia or abnormality.  Tiny  umbilical hernia. No suspicious lytic or blastic osseous lesions. No acute displaced fracture. Diffusely decreased bone density. L3 vertebral body hemangioma. T11 vertebral body hemangioma. Marked vertebral body height loss of the L1 level status post kyphoplasty. Associated 1 cm retropulsion into the central canal leading to at least moderate central canal stenosis. Multilevel degenerative changes of the spine. Old healed sacral fracture at the S3 level. IMPRESSION: 1. Colonic diverticulosis with rectosigmoid pericolonic fat stranding. No focal inflammatory finding. Findings more suggestive of sigmoid  colitis and proctitis versus less likely colonic diverticulitis. Markedly limited evaluation on this noncontrast study. Recommend colonoscopy status post treatment and status post complete resolution of inflammatory changes to exclude an underlying lesion. 2. Large hiatal hernia. 3. Indeterminate 1 cm left renal lesion. When the patient is clinically stable and able to follow directions and hold their breath (preferably as an outpatient) further evaluation with dedicated abdominal MRI renal protocol should be considered. 4. Marked vertebral body height loss of the L1 level status post kyphoplasty. Associated 1 cm retropulsion into the central canal leading to at least moderate central canal stenosis. 5. Diffusely decreased bone density. 6.  Aortic Atherosclerosis (ICD10-I70.0). Electronically Signed   By: Iven Finn M.D.   On: 05/16/2022 21:39    Procedures Procedures    Medications Ordered in ED Medications  lactated ringers bolus 1,000 mL (0 mLs Intravenous Hold 05/16/22 2300)  cefTRIAXone (ROCEPHIN) 2 g in sodium chloride 0.9 % 100 mL IVPB (0 g Intravenous Stopped 05/16/22 2352)    And  metroNIDAZOLE (FLAGYL) IVPB 500 mg (0 mg Intravenous Stopped 05/16/22 2352)  lactated ringers bolus 1,000 mL (0 mLs Intravenous Stopped 05/16/22 2352)    ED Course/ Medical Decision Making/ A&P                              Medical Decision Making Amount and/or Complexity of Data Reviewed Independent Historian: caregiver and EMS External Data Reviewed: notes. Labs: ordered. Decision-making details documented in ED Course. Radiology: ordered and independent interpretation performed. Decision-making details documented in ED Course. ECG/medicine tests: ordered and independent interpretation performed. Decision-making details documented in ED Course.  Risk Prescription drug management.   Pt with multiple medical problems and comorbidities and presenting today with a complaint that caries a high risk for morbidity and mortality.  Here today due to concern for GI bleeding.  Patient has had diarrhea for the last 5 days.  She does not take any anticoagulation.  On exam patient does have suprapubic pain on exam.  Vital signs are stable.  Rectal exam without evidence of hemorrhoids today.  Patient does have a prior history of diverticulitis and concern for diverticulosis causing lower GI bleed versus other causes for bleed such as AVM or PUD.  Concern for constipation and proctitis as well.  Also abdominal pain could be caused by UTI.  I independently interpreted patient's labs and EKG.  EKG today with a sinus rhythm without acute findings.  A-fib has resolved from prior.  I-STAT Chem-8 with a creatinine of 1.7 today which appears patient's baseline is 1.5, CBC with stable hemoglobin of 11.4 which is unchanged and normal white count.  Hemoccult was positive.  CT to further evaluate.   I have independently visualized and interpreted pt's images today.  CT with no evidence of renal stones or appendicitis.  Radiology reports patient has colonic diverticulosis with rectosigmoid pericolonic fat stranding suggestive for sigmoid colitis and proctitis versus possible colonic diverticulitis.  Also patient has a large hiatal hernia and indeterminate 1 cm left renal lesion which needs outpatient follow-up.  Spoke with the caregiver and  the patient about all of the findings.  At this time patient is hemodynamically stable and well-appearing.  Feel that she is stable for discharge home.  Did give strict return precautions and she has a follow-up with her doctor on Tuesday and can get repeat blood work to ensure no change in hemoglobin levels.  However on exam here  there is no frank blood on her rectal exam and she does not take any anticoagulation.  Patient given Rocephin and Flagyl here and a prescription of Augmentin.  They are comfortable with this plan.  She has a caregiver that comes in every day and then frequent checks by staff at the independent living facility where she lives.          Final Clinical Impression(s) / ED Diagnoses Final diagnoses:  Colitis  Diarrhea, unspecified type    Rx / DC Orders ED Discharge Orders          Ordered    amoxicillin-clavulanate (AUGMENTIN) 875-125 MG tablet  Every 12 hours        05/16/22 2352              Blanchie Dessert, MD 05/17/22 0000

## 2022-05-17 NOTE — ED Notes (Signed)
[facility] called to transport patient to [facility].

## 2022-05-17 NOTE — ED Notes (Signed)
Pt rolled and cleaned.

## 2022-06-10 ENCOUNTER — Encounter (HOSPITAL_COMMUNITY): Payer: Self-pay

## 2022-06-10 ENCOUNTER — Emergency Department (HOSPITAL_COMMUNITY)
Admission: EM | Admit: 2022-06-10 | Discharge: 2022-06-10 | Disposition: A | Discharge status: Home / Self Care | Payer: Medicare PPO | Attending: Emergency Medicine | Admitting: Emergency Medicine

## 2022-06-10 ENCOUNTER — Other Ambulatory Visit: Payer: Self-pay

## 2022-06-10 DIAGNOSIS — R300 Dysuria: Secondary | ICD-10-CM | POA: Diagnosis present

## 2022-06-10 DIAGNOSIS — N3090 Cystitis, unspecified without hematuria: Secondary | ICD-10-CM | POA: Diagnosis not present

## 2022-06-10 DIAGNOSIS — Z794 Long term (current) use of insulin: Secondary | ICD-10-CM | POA: Insufficient documentation

## 2022-06-10 DIAGNOSIS — E119 Type 2 diabetes mellitus without complications: Secondary | ICD-10-CM | POA: Insufficient documentation

## 2022-06-10 DIAGNOSIS — Z7984 Long term (current) use of oral hypoglycemic drugs: Secondary | ICD-10-CM | POA: Insufficient documentation

## 2022-06-10 LAB — URINALYSIS, ROUTINE W REFLEX MICROSCOPIC
Bilirubin Urine: NEGATIVE
Glucose, UA: NEGATIVE mg/dL
Hgb urine dipstick: NEGATIVE
Ketones, ur: NEGATIVE mg/dL
Nitrite: NEGATIVE
Protein, ur: 100 mg/dL — AB
Specific Gravity, Urine: 1.014 (ref 1.005–1.030)
WBC, UA: >50 WBC/hpf (ref 0–5)
pH: 5 (ref 5.0–8.0)

## 2022-06-10 MED ORDER — CIPROFLOXACIN HCL 500 MG PO TABS: 500.0000 mg | ORAL_TABLET | Freq: Two times a day (BID) | ORAL | 0 refills | Status: DC

## 2022-06-10 NOTE — ED Provider Notes (Signed)
[facility] Provider Note   CSN: [id number] Arrival date & time: 06/10/22  1111     History {Add pertinent medical, surgical, social history, OB history to HPI:1} Chief Complaint  Patient presents with   Dysuria    Diana [name] is a 89 y.o. female.  Patient has a history of thyroid disease and diabetes.  She is complaining of dysuria   Dysuria      Home Medications Prior to Admission medications   Medication Sig Start Date End Date Taking? Authorizing Provider  ciprofloxacin (CIPRO) 500 MG tablet Take 1 tablet (500 mg total) by mouth 2 (two) times daily. One po bid x 7 days 06/10/22  Yes Milton Ferguson, MD  amLODipine (NORVASC) 5 MG tablet Take 5 mg by mouth in the morning.    [provider]  amoxicillin-clavulanate (AUGMENTIN) 875-125 MG tablet Take 1 tablet by mouth every 12 (twelve) hours. 05/16/22   Blanchie Dessert, MD  brimonidine-timolol (COMBIGAN) 0.2-0.5 % ophthalmic solution Place 1 drop into both eyes every 12 (twelve) hours. 05/26/20   Medina-Vargas, Monina C, NP  cephALEXin (KEFLEX) 250 MG capsule Take 250 mg by mouth at bedtime. Prophylactic.    [provider]  Cholecalciferol (VITAMIN D) 50 MCG (2000 UT) tablet Take 2,000 Units by mouth in the morning.    [provider]  Cranberry 500 MG CAPS Take 500 mg by mouth 2 (two) times daily.    [provider]  ferrous sulfate 325 (65 FE) MG EC tablet Take 325 mg by mouth in the morning.    [provider]  FLUoxetine (PROZAC) 40 MG capsule Take 1 capsule by mouth in the morning. 01/07/22   [provider]  furosemide (LASIX) 20 MG tablet Take 1 tablet (20 mg total) by mouth daily as needed (shortness of breath, take for 5 days). Patient not taking: Reported on 03/15/2022 10/24/21 10/19/22  Buford Dresser, MD  insulin glargine (LANTUS) 100 UNIT/ML injection Inject 8 Units into the skin at bedtime. Patient not  taking: Reported on 03/15/2022    [provider]  levothyroxine (SYNTHROID) 75 MCG tablet Take 1 tablet by mouth in the morning. 01/14/22   [provider]  losartan (COZAAR) 50 MG tablet Take 1 tablet (50 mg total) by mouth 2 (two) times daily. 05/26/20   Medina-Vargas, Monina C, NP  metFORMIN (GLUCOPHAGE) 500 MG tablet Take 500 mg by mouth 2 (two) times daily.    [provider]  metoprolol succinate (TOPROL-XL) 25 MG 24 hr tablet Take 0.5 tablets (12.5 mg total) by mouth daily. Take with or immediately following a meal. Patient taking differently: Take 12.5 mg by mouth in the morning. 12/10/21 12/05/22  Buford Dresser, MD  Multiple Vitamins-Minerals (PRESERVISION AREDS 2 PO) Take 1 capsule by mouth in the morning.    [provider]  ondansetron (ZOFRAN-ODT) 4 MG disintegrating tablet Take 1 tablet (4 mg total) by mouth every 8 (eight) hours as needed for nausea or vomiting. Patient not taking: Reported on 03/15/2022 12/16/21   Smoot, Leary Roca, PA-C  pantoprazole (PROTONIX) 40 MG tablet Take 1 tablet (40 mg total) by mouth 2 (two) times daily before a meal. 05/26/20   Medina-Vargas, Monina C, NP  rosuvastatin (CRESTOR) 10 MG tablet Take 10 mg by mouth at bedtime.    [provider]      Allergies    Codeine sulfate, Sps [sodium polystyrene sulfonate], and Sulfa antibiotics    Review of Systems  Review of Systems  Genitourinary:  Positive for dysuria.    Physical Exam Updated Vital Signs BP (!) 151/69   Pulse (!) 56   Temp (!) 97.5 F (36.4 C) (Oral)   Resp 17   Ht 5\' 8"  (1.727 m)   Wt 71.7 kg   LMP  (LMP Unknown)   SpO2 99%   BMI 24.02 kg/m  Physical Exam  ED Results / Procedures / Treatments   Labs (all labs ordered are listed, but only abnormal results are displayed) Labs Reviewed  URINALYSIS, ROUTINE W REFLEX MICROSCOPIC - Abnormal; Notable for the following components:      Result Value   Color, Urine AMBER (*)     APPearance TURBID (*)    Protein, ur 100 (*)    Leukocytes,Ua MODERATE (*)    Bacteria, UA RARE (*)    All other components within normal limits    EKG None  Radiology No results found.  Procedures Procedures  {Document cardiac monitor, telemetry assessment procedure when appropriate:1}  Medications Ordered in ED Medications - No data to display  ED Course/ Medical Decision Making/ A&P   {   Click here for ABCD2, HEART and other calculatorsREFRESH Note before signing :1}                          Medical Decision Making Amount and/or Complexity of Data Reviewed Labs: ordered.  Risk Prescription drug management.   Patient with an UTI.  She will be prescribed Cipro and follow-up with her PCP  {Document critical care time when appropriate:1} {Document review of labs and clinical decision tools ie heart score, Chads2Vasc2 etc:1}  {Document your independent review of radiology images, and any outside records:1} {Document your discussion with family members, caretakers, and with consultants:1} {Document social determinants of health affecting pt's care:1} {Document your decision making why or why not admission, treatments were needed:1} Final Clinical Impression(s) / ED Diagnoses Final diagnoses:  Cystitis    Rx / DC Orders ED Discharge Orders          Ordered    ciprofloxacin (CIPRO) 500 MG tablet  2 times daily        06/10/22 1402

## 2022-06-10 NOTE — Discharge Instructions (Signed)
Follow-up with your family doctor next week for recheck or get seen sooner for problems

## 2022-06-10 NOTE — ED Triage Notes (Signed)
Pt arrived POV with daughter who thinks she might have a UTI. Pt stated she had burning when she used the bathroom this morning and then the daughter states she was confused last night.

## 2022-06-28 ENCOUNTER — Other Ambulatory Visit: Payer: Self-pay

## 2022-06-28 ENCOUNTER — Encounter (HOSPITAL_COMMUNITY): Payer: Self-pay

## 2022-06-28 ENCOUNTER — Emergency Department (HOSPITAL_COMMUNITY): Payer: Medicare PPO

## 2022-06-28 ENCOUNTER — Inpatient Hospital Stay (HOSPITAL_COMMUNITY)
Admission: EM | Admit: 2022-06-28 | Discharge: 2022-07-03 | DRG: 372 | Disposition: A | Discharge status: Skilled Nursing Facility | Payer: Medicare PPO | Attending: Internal Medicine | Admitting: Internal Medicine

## 2022-06-28 DIAGNOSIS — Z87891 Personal history of nicotine dependence: Secondary | ICD-10-CM

## 2022-06-28 DIAGNOSIS — Z8719 Personal history of other diseases of the digestive system: Secondary | ICD-10-CM

## 2022-06-28 DIAGNOSIS — Z885 Allergy status to narcotic agent status: Secondary | ICD-10-CM

## 2022-06-28 DIAGNOSIS — Z9013 Acquired absence of bilateral breasts and nipples: Secondary | ICD-10-CM

## 2022-06-28 DIAGNOSIS — Z853 Personal history of malignant neoplasm of breast: Secondary | ICD-10-CM

## 2022-06-28 DIAGNOSIS — Z9842 Cataract extraction status, left eye: Secondary | ICD-10-CM

## 2022-06-28 DIAGNOSIS — K5792 Diverticulitis of intestine, part unspecified, without perforation or abscess without bleeding: Secondary | ICD-10-CM

## 2022-06-28 DIAGNOSIS — R413 Other amnesia: Secondary | ICD-10-CM | POA: Diagnosis present

## 2022-06-28 DIAGNOSIS — E162 Hypoglycemia, unspecified: Secondary | ICD-10-CM | POA: Diagnosis present

## 2022-06-28 DIAGNOSIS — F32A Depression, unspecified: Secondary | ICD-10-CM | POA: Diagnosis present

## 2022-06-28 DIAGNOSIS — I4891 Unspecified atrial fibrillation: Secondary | ICD-10-CM | POA: Diagnosis present

## 2022-06-28 DIAGNOSIS — N184 Chronic kidney disease, stage 4 (severe): Secondary | ICD-10-CM | POA: Diagnosis present

## 2022-06-28 DIAGNOSIS — E785 Hyperlipidemia, unspecified: Secondary | ICD-10-CM | POA: Diagnosis present

## 2022-06-28 DIAGNOSIS — T3695XA Adverse effect of unspecified systemic antibiotic, initial encounter: Secondary | ICD-10-CM | POA: Diagnosis present

## 2022-06-28 DIAGNOSIS — G4733 Obstructive sleep apnea (adult) (pediatric): Secondary | ICD-10-CM | POA: Diagnosis present

## 2022-06-28 DIAGNOSIS — Z9049 Acquired absence of other specified parts of digestive tract: Secondary | ICD-10-CM

## 2022-06-28 DIAGNOSIS — E11649 Type 2 diabetes mellitus with hypoglycemia without coma: Secondary | ICD-10-CM | POA: Diagnosis present

## 2022-06-28 DIAGNOSIS — E039 Hypothyroidism, unspecified: Secondary | ICD-10-CM | POA: Diagnosis present

## 2022-06-28 DIAGNOSIS — Z9841 Cataract extraction status, right eye: Secondary | ICD-10-CM

## 2022-06-28 DIAGNOSIS — Z90710 Acquired absence of both cervix and uterus: Secondary | ICD-10-CM

## 2022-06-28 DIAGNOSIS — K219 Gastro-esophageal reflux disease without esophagitis: Secondary | ICD-10-CM | POA: Diagnosis present

## 2022-06-28 DIAGNOSIS — Z7984 Long term (current) use of oral hypoglycemic drugs: Secondary | ICD-10-CM

## 2022-06-28 DIAGNOSIS — Z79890 Hormone replacement therapy (postmenopausal): Secondary | ICD-10-CM

## 2022-06-28 DIAGNOSIS — K5732 Diverticulitis of large intestine without perforation or abscess without bleeding: Secondary | ICD-10-CM | POA: Diagnosis present

## 2022-06-28 DIAGNOSIS — F0394 Unspecified dementia, unspecified severity, with anxiety: Secondary | ICD-10-CM | POA: Diagnosis present

## 2022-06-28 DIAGNOSIS — M8580 Other specified disorders of bone density and structure, unspecified site: Secondary | ICD-10-CM | POA: Diagnosis present

## 2022-06-28 DIAGNOSIS — E1122 Type 2 diabetes mellitus with diabetic chronic kidney disease: Secondary | ICD-10-CM | POA: Diagnosis present

## 2022-06-28 DIAGNOSIS — I2510 Atherosclerotic heart disease of native coronary artery without angina pectoris: Secondary | ICD-10-CM | POA: Diagnosis present

## 2022-06-28 DIAGNOSIS — D509 Iron deficiency anemia, unspecified: Secondary | ICD-10-CM | POA: Diagnosis present

## 2022-06-28 DIAGNOSIS — Z8249 Family history of ischemic heart disease and other diseases of the circulatory system: Secondary | ICD-10-CM

## 2022-06-28 DIAGNOSIS — A0472 Enterocolitis due to Clostridium difficile, not specified as recurrent: Principal | ICD-10-CM | POA: Diagnosis present

## 2022-06-28 DIAGNOSIS — E860 Dehydration: Secondary | ICD-10-CM | POA: Diagnosis present

## 2022-06-28 DIAGNOSIS — I493 Ventricular premature depolarization: Secondary | ICD-10-CM | POA: Diagnosis present

## 2022-06-28 DIAGNOSIS — Z7409 Other reduced mobility: Secondary | ICD-10-CM | POA: Diagnosis present

## 2022-06-28 DIAGNOSIS — F0393 Unspecified dementia, unspecified severity, with mood disturbance: Secondary | ICD-10-CM | POA: Diagnosis present

## 2022-06-28 DIAGNOSIS — Z79899 Other long term (current) drug therapy: Secondary | ICD-10-CM

## 2022-06-28 DIAGNOSIS — I129 Hypertensive chronic kidney disease with stage 1 through stage 4 chronic kidney disease, or unspecified chronic kidney disease: Secondary | ICD-10-CM | POA: Diagnosis present

## 2022-06-28 DIAGNOSIS — Z87440 Personal history of urinary (tract) infections: Secondary | ICD-10-CM

## 2022-06-28 DIAGNOSIS — Z882 Allergy status to sulfonamides status: Secondary | ICD-10-CM

## 2022-06-28 DIAGNOSIS — Z794 Long term (current) use of insulin: Secondary | ICD-10-CM

## 2022-06-28 DIAGNOSIS — E876 Hypokalemia: Secondary | ICD-10-CM | POA: Insufficient documentation

## 2022-06-28 DIAGNOSIS — N183 Chronic kidney disease, stage 3 unspecified: Secondary | ICD-10-CM | POA: Diagnosis present

## 2022-06-28 LAB — CBC WITH DIFFERENTIAL/PLATELET
Abs Immature Granulocytes: 0.01 10*3/uL (ref 0.00–0.07)
Basophils Absolute: 0 10*3/uL (ref 0.0–0.1)
Basophils Relative: 1 %
Eosinophils Absolute: 0.3 10*3/uL (ref 0.0–0.5)
Eosinophils Relative: 4 %
HCT: 29.4 % — ABNORMAL LOW (ref 36.0–46.0)
Hemoglobin: 9.1 g/dL — ABNORMAL LOW (ref 12.0–15.0)
Immature Granulocytes: 0 %
Lymphocytes Relative: 13 %
Lymphs Abs: 0.7 10*3/uL (ref 0.7–4.0)
MCH: 29.4 pg (ref 26.0–34.0)
MCHC: 31 g/dL (ref 30.0–36.0)
MCV: 95.1 fL (ref 80.0–100.0)
Monocytes Absolute: 0.8 10*3/uL (ref 0.1–1.0)
Monocytes Relative: 14 %
Neutro Abs: 4 10*3/uL (ref 1.7–7.7)
Neutrophils Relative %: 68 %
Platelets: 251 10*3/uL (ref 150–400)
RBC: 3.09 MIL/uL — ABNORMAL LOW (ref 3.87–5.11)
RDW: 15.3 % (ref 11.5–15.5)
WBC: 5.8 10*3/uL (ref 4.0–10.5)
nRBC: 0 % (ref 0.0–0.2)

## 2022-06-28 LAB — LIPASE, BLOOD: Lipase: 29 U/L (ref 11–51)

## 2022-06-28 LAB — COMPREHENSIVE METABOLIC PANEL
ALT: 14 U/L (ref 0–44)
AST: 15 U/L (ref 15–41)
Albumin: 2.3 g/dL — ABNORMAL LOW (ref 3.5–5.0)
Alkaline Phosphatase: 52 U/L (ref 38–126)
Anion gap: 8 (ref 5–15)
BUN: 27 mg/dL — ABNORMAL HIGH (ref 8–23)
CO2: 19 mmol/L — ABNORMAL LOW (ref 22–32)
Calcium: 8.4 mg/dL — ABNORMAL LOW (ref 8.9–10.3)
Chloride: 115 mmol/L — ABNORMAL HIGH (ref 98–111)
Creatinine, Ser: 1.7 mg/dL — ABNORMAL HIGH (ref 0.44–1.00)
GFR, Estimated: 29 mL/min — ABNORMAL LOW (ref >60)
Glucose, Bld: 142 mg/dL — ABNORMAL HIGH (ref 70–99)
Potassium: 3.1 mmol/L — ABNORMAL LOW (ref 3.5–5.1)
Sodium: 142 mmol/L (ref 135–145)
Total Bilirubin: 0.4 mg/dL (ref 0.3–1.2)
Total Protein: 5.3 g/dL — ABNORMAL LOW (ref 6.5–8.1)

## 2022-06-28 LAB — GLUCOSE, CAPILLARY: Glucose-Capillary: 147 mg/dL — ABNORMAL HIGH (ref 70–99)

## 2022-06-28 LAB — C DIFFICILE QUICK SCREEN W PCR REFLEX
C Diff antigen: POSITIVE — AB
C Diff interpretation: DETECTED
C Diff toxin: POSITIVE — AB

## 2022-06-28 MED ORDER — BRIMONIDINE TARTRATE-TIMOLOL 0.2-0.5 % OP SOLN: 1.0000 [drp] | Freq: Two times a day (BID) | OPHTHALMIC | Status: DC

## 2022-06-28 MED ORDER — POTASSIUM CHLORIDE CRYS ER 20 MEQ PO TBCR
40.0000 meq | EXTENDED_RELEASE_TABLET | Freq: Once | ORAL | Status: AC
  Administered 2022-06-28: 40 meq via ORAL
  Filled 2022-06-28: qty 2

## 2022-06-28 MED ORDER — VANCOMYCIN HCL 125 MG PO CAPS
125.0000 mg | ORAL_CAPSULE | Freq: Four times a day (QID) | ORAL | Status: DC
  Administered 2022-06-28 – 2022-07-03 (×20): 125 mg via ORAL
  Filled 2022-06-28 (×23): qty 1

## 2022-06-28 MED ORDER — BRIMONIDINE TARTRATE 0.2 % OP SOLN
1.0000 [drp] | Freq: Two times a day (BID) | OPHTHALMIC | Status: DC
  Administered 2022-06-28 – 2022-07-03 (×11): 1 [drp] via OPHTHALMIC
  Filled 2022-06-28: qty 5

## 2022-06-28 MED ORDER — FLUOXETINE HCL 20 MG PO CAPS
20.0000 mg | ORAL_CAPSULE | Freq: Every morning | ORAL | Status: DC
  Administered 2022-06-29 – 2022-06-30 (×2): 20 mg via ORAL
  Filled 2022-06-28 (×3): qty 1

## 2022-06-28 MED ORDER — POTASSIUM CHLORIDE CRYS ER 20 MEQ PO TBCR
40.0000 meq | EXTENDED_RELEASE_TABLET | ORAL | Status: AC
  Administered 2022-06-28: 40 meq via ORAL
  Filled 2022-06-28: qty 2

## 2022-06-28 MED ORDER — METOPROLOL SUCCINATE ER 25 MG PO TB24
12.5000 mg | ORAL_TABLET | Freq: Every morning | ORAL | Status: DC
  Administered 2022-06-29 – 2022-07-03 (×5): 12.5 mg via ORAL
  Filled 2022-06-28 (×5): qty 1

## 2022-06-28 MED ORDER — TIMOLOL MALEATE 0.5 % OP SOLN
1.0000 [drp] | Freq: Two times a day (BID) | OPHTHALMIC | Status: DC
  Administered 2022-06-28 – 2022-07-03 (×11): 1 [drp] via OPHTHALMIC
  Filled 2022-06-28: qty 5

## 2022-06-28 MED ORDER — AMLODIPINE BESYLATE 5 MG PO TABS: 5.0000 mg | ORAL_TABLET | Freq: Every morning | ORAL | Status: DC

## 2022-06-28 MED ORDER — INSULIN ASPART 100 UNIT/ML IJ SOLN
0.0000 [IU] | Freq: Three times a day (TID) | INTRAMUSCULAR | Status: DC
  Administered 2022-06-29 (×2): 1 [IU] via SUBCUTANEOUS
  Administered 2022-06-30: 3 [IU] via SUBCUTANEOUS
  Administered 2022-07-01 – 2022-07-02 (×2): 1 [IU] via SUBCUTANEOUS
  Administered 2022-07-02: 2 [IU] via SUBCUTANEOUS
  Administered 2022-07-02: 1 [IU] via SUBCUTANEOUS

## 2022-06-28 MED ORDER — HYDROMORPHONE HCL 1 MG/ML IJ SOLN: 0.5000 mg | INTRAMUSCULAR | Status: DC | PRN

## 2022-06-28 MED ORDER — ONDANSETRON HCL 4 MG/2ML IJ SOLN
4.0000 mg | Freq: Four times a day (QID) | INTRAMUSCULAR | Status: DC | PRN
  Filled 2022-06-28: qty 2

## 2022-06-28 MED ORDER — HYDRALAZINE HCL 20 MG/ML IJ SOLN: 5.0000 mg | Freq: Four times a day (QID) | INTRAMUSCULAR | Status: DC | PRN

## 2022-06-28 MED ORDER — ROSUVASTATIN CALCIUM 5 MG PO TABS
10.0000 mg | ORAL_TABLET | Freq: Every day | ORAL | Status: DC
  Administered 2022-06-28 – 2022-07-03 (×6): 10 mg via ORAL
  Filled 2022-06-28 (×6): qty 2

## 2022-06-28 MED ORDER — PANTOPRAZOLE SODIUM 40 MG PO TBEC
40.0000 mg | DELAYED_RELEASE_TABLET | Freq: Two times a day (BID) | ORAL | Status: DC
  Administered 2022-06-29 – 2022-07-03 (×9): 40 mg via ORAL
  Filled 2022-06-28 (×9): qty 1

## 2022-06-28 MED ORDER — LACTATED RINGERS IV BOLUS
1000.0000 mL | Freq: Once | INTRAVENOUS | Status: AC
  Administered 2022-06-28: 1000 mL via INTRAVENOUS

## 2022-06-28 MED ORDER — LACTATED RINGERS IV SOLN: INTRAVENOUS | Status: DC

## 2022-06-28 MED ORDER — LEVOTHYROXINE SODIUM 75 MCG PO TABS
75.0000 ug | ORAL_TABLET | Freq: Every morning | ORAL | Status: DC
  Administered 2022-06-29 – 2022-07-03 (×5): 75 ug via ORAL
  Filled 2022-06-28 (×5): qty 1

## 2022-06-28 MED ORDER — SODIUM CHLORIDE 0.9 % IV SOLN: INTRAVENOUS | Status: DC

## 2022-06-28 MED ORDER — ONDANSETRON HCL 4 MG PO TABS
4.0000 mg | ORAL_TABLET | Freq: Four times a day (QID) | ORAL | Status: DC | PRN
  Administered 2022-07-01: 4 mg via ORAL
  Filled 2022-06-28: qty 1

## 2022-06-28 MED ORDER — FERROUS SULFATE 325 (65 FE) MG PO TABS
325.0000 mg | ORAL_TABLET | Freq: Every morning | ORAL | Status: DC
  Administered 2022-06-29 – 2022-07-03 (×5): 325 mg via ORAL
  Filled 2022-06-28 (×5): qty 1

## 2022-06-28 NOTE — ED Notes (Signed)
ED TO INPATIENT HANDOFF REPORT  ED Nurse Name and Phone #: [phone]  S Name/Age/Gender Diana [name] 89 y.o. female Room/Bed: 027C/027C  Code Status   Code Status: Full Code  Home/SNF/Other Skilled nursing facility Patient oriented to: self and place Is this baseline? Yes   Triage Complete: Triage complete  Chief Complaint Colitis due to Clostridium difficile [A04.72]  Triage Note Called by facility for diarrhea x 3  seen by personal caregiver and what was reported to EMS as a "Speck of blood" agitation and dementia at baseline alert to name only  Caregiver here estimates about 3 pudding like stools every 12 hr shit   Allergies Allergies  Allergen Reactions   Codeine Sulfate Nausea Only   Sps [Sodium Polystyrene Sulfonate] Other (See Comments)    Unknown reaction   Sulfa Antibiotics Other (See Comments)    Unknown reaction    Level of Care/Admitting Diagnosis ED Disposition     ED Disposition  Admit   Condition  --   Comment  Hospital Area: [facility] [[id number]]  Level of Care: Telemetry Medical [104]  May place patient in observation at [facility] or [facility] if equivalent level of care is available:: No  Covid Evaluation: Asymptomatic - no recent exposure (last 10 days) testing not required  Diagnosis: Colitis due to Clostridium difficile [119147]  Admitting Physician: Emeline General [[id number]]  Attending Physician: Emeline General [[id number]]          B Medical/Surgery History Past Medical History:  Diagnosis Date   Anemia    Anemia of decreased vitamin B12 absorption 05/26/2005   Arthritis    Atrial fibrillation    Breast cancer 2001   Cataract    GERD (gastroesophageal reflux disease)    Hiatal hernia    History of tobacco abuse    Hx of colonic polyps    Hyperlipidemia    Hypertension    Hypothyroidism    Memory loss    Obesity    OSA on CPAP 09/2007   AHI 10.6/hr overall, 43.64/hr during REM, lost weight, does not use  cpap   Osteopenia    Renal insufficiency    Type 2 diabetes mellitus    Past Surgical History:  Procedure Laterality Date   BACK SURGERY  05/30/2009   BIOPSY  01/01/2019   Procedure: BIOPSY;  Surgeon: Rachael Fee, MD;  Location: [facility] ENDOSCOPY;  Service: Endoscopy;;   BOWEL RESECTION  1974   CATARACT EXTRACTION Bilateral    bilateral   CHOLECYSTECTOMY  1974   COLONOSCOPY     ESOPHAGOGASTRODUODENOSCOPY N/A 01/01/2019   Procedure: ESOPHAGOGASTRODUODENOSCOPY (EGD);  Surgeon: Rachael Fee, MD;  Location: [facility] ENDOSCOPY;  Service: Endoscopy;  Laterality: N/A;   EXTERNAL FIXATION REMOVAL Left 04/27/2020   Procedure: REMOVAL EXTERNAL FIXATION ARM;  Surgeon: Roby Lofts, MD;  Location: [facility] OR;  Service: Orthopedics;  Laterality: Left;   MASTECTOMY Bilateral 2001   bilateral sentinel lymph nodes bio   NM MYOCAR PERF WALL MOTION  06/2007   dipyridamole; perfusion defect in inferior myocardium consistent with diaphragmatic attenuation, remaining myocardium with no ischemia/infarct, EF 73%; normal, low risk scan    ORIF ELBOW FRACTURE Left 03/21/2020   Procedure: OPEN REDUCTION INTERNAL FIXATION (ORIF) ELBOW/OLECRANON FRACTURE;  Surgeon: Roby Lofts, MD;  Location: [facility] OR;  Service: Orthopedics;  Laterality: Left;   TOTAL ABDOMINAL HYSTERECTOMY  1975   TRANSTHORACIC ECHOCARDIOGRAM  02/2010   EF=>55%, stage 1 diastolic dysfunction; borderline RV enlargement; LA mild-mod  dilated; mild mitral annular calcif & mild MR; mild TR with normal RSVP, AV moderately sclerotics     A IV Location/Drains/Wounds Patient Lines/Drains/Airways Status     Active Line/Drains/Airways     Name Placement date Placement time Site Days   Peripheral IV 06/28/22 20 G Right Antecubital 06/28/22  1334  Antecubital  less than 1   Wound / Incision (Open or Dehisced) 09/12/21 Puncture Thigh Right;Upper 09/12/21  1328  Thigh  289            Intake/Output Last 24 hours No intake or output data in the 24 hours  ending 06/28/22 1902  Labs/Imaging Results for orders placed or performed during the hospital encounter of 06/28/22 (from the past 48 hour(s))  CBC with Differential     Status: Abnormal   Collection Time: 06/28/22  1:41 PM  Result Value Ref Range   WBC 5.8 4.0 - 10.5 K/uL   RBC 3.09 (L) 3.87 - 5.11 MIL/uL   Hemoglobin 9.1 (L) 12.0 - 15.0 g/dL   HCT 13.0 (L) 86.5 - 78.4 %   MCV 95.1 80.0 - 100.0 fL   MCH 29.4 26.0 - 34.0 pg   MCHC 31.0 30.0 - 36.0 g/dL   RDW 69.6 29.5 - 28.4 %   Platelets 251 150 - 400 K/uL   nRBC 0.0 0.0 - 0.2 %   Neutrophils Relative % 68 %   Neutro Abs 4.0 1.7 - 7.7 K/uL   Lymphocytes Relative 13 %   Lymphs Abs 0.7 0.7 - 4.0 K/uL   Monocytes Relative 14 %   Monocytes Absolute 0.8 0.1 - 1.0 K/uL   Eosinophils Relative 4 %   Eosinophils Absolute 0.3 0.0 - 0.5 K/uL   Basophils Relative 1 %   Basophils Absolute 0.0 0.0 - 0.1 K/uL   Immature Granulocytes 0 %   Abs Immature Granulocytes 0.01 0.00 - 0.07 K/uL    Comment: Performed at [facility], [address] [place], [place] [address]  Comprehensive metabolic panel     Status: Abnormal   Collection Time: 06/28/22  1:41 PM  Result Value Ref Range   Sodium 142 135 - 145 mmol/L   Potassium 3.1 (L) 3.5 - 5.1 mmol/L   Chloride 115 (H) 98 - 111 mmol/L   CO2 19 (L) 22 - 32 mmol/L   Glucose, Bld 142 (H) 70 - 99 mg/dL    Comment: Glucose reference range applies only to samples taken after fasting for at least 8 hours.   BUN 27 (H) 8 - 23 mg/dL   Creatinine, Ser 0.10 (H) 0.44 - 1.00 mg/dL   Calcium 8.4 (L) 8.9 - 10.3 mg/dL   Total Protein 5.3 (L) 6.5 - 8.1 g/dL   Albumin 2.3 (L) 3.5 - 5.0 g/dL   AST 15 15 - 41 U/L   ALT 14 0 - 44 U/L   Alkaline Phosphatase 52 38 - 126 U/L   Total Bilirubin 0.4 0.3 - 1.2 mg/dL   GFR, Estimated 29 (L) >60 mL/min    Comment: (NOTE) Calculated using the CKD-EPI Creatinine Equation (2021)    Anion gap 8 5 - 15    Comment: Performed at [facility], [address], [place], [place] [address]  Lipase, blood     Status: None   Collection Time: 06/28/22  1:41 PM  Result Value Ref Range   Lipase 29 11 - 51 U/L    Comment: Performed at [facility], [address] [place], [place] [address]  C Difficile Quick Screen w PCR reflex     Status: Abnormal   Collection Time: 06/28/22  5:01 PM   Specimen: STOOL  Result Value Ref Range   C Diff antigen POSITIVE (A) NEGATIVE   C Diff toxin POSITIVE (A) NEGATIVE   C Diff interpretation Toxin producing C. difficile detected.     Comment: CRITICAL RESULT CALLED TO, READ BACK BY AND VERIFIED WITH:  Rolley Sims, RN 06/28/22 1737 A. LAFRANCE Performed at [facility], [address], [place], [place] [address]    CT ABDOMEN PELVIS WO CONTRAST  Result Date: 06/28/2022 CLINICAL DATA:  Right lower quadrant abdominal pain. EXAM: CT ABDOMEN AND PELVIS WITHOUT CONTRAST TECHNIQUE: Multidetector CT imaging of the abdomen and pelvis was performed following the standard protocol without IV contrast. RADIATION DOSE REDUCTION: This exam was performed according to the departmental dose-optimization program which includes automated exposure control, adjustment of the mA and/or kV according to patient size and/or use of iterative reconstruction technique. COMPARISON:  CT abdomen pelvis dated 05/16/2022. FINDINGS: Lower chest: There is a moderate right and small left pleural effusion with associated atelectasis. The heart is mildly enlarged. There is a large hiatal hernia. Hepatobiliary: No focal liver abnormality is seen. Status post cholecystectomy. No biliary dilatation. Pancreas: Unremarkable. No pancreatic ductal dilatation or surrounding inflammatory changes. Spleen: Normal in size without focal abnormality. Adrenals/Urinary Tract: Adrenal glands are unremarkable. There is a 3 mm calculus in the left kidney which is likely nonobstructive. No renal calculi is seen on the right. Left renal cysts are redemonstrated. No imaging  follow-up is recommended for this finding. Both kidneys are atrophic. Bladder is unremarkable. Stomach/Bowel: Stomach is within normal limits. The appendix is not definitely identified, however no pericecal inflammatory changes are noted to suggest acute appendicitis. There is colonic diverticulosis with wall thickening and associated fat stranding involving the descending and sigmoid colon, similar to prior exam. No definite abscess formation or fistula formation. No evidence of bowel obstruction. Vascular/Lymphatic: Aortic atherosclerosis. No enlarged abdominal or pelvic lymph nodes. Reproductive: Status post hysterectomy. No adnexal masses. Other: There is a small fat containing paraumbilical hernia. No significant free intraperitoneal fluid. Musculoskeletal: Severe degenerative changes are seen in the spine. Vertebroplasty changes at L1 are redemonstrated with 10 mm retropulsion into the central canal at this level with moderate to severe central canal stenosis, unchanged. IMPRESSION: 1. Acute uncomplicated diverticulitis of the descending and sigmoid colon, similar to prior. 2. Moderate right and small left pleural effusions with associated atelectasis. Aortic Atherosclerosis (ICD10-I70.0). Electronically Signed   By: Romona Curls M.D.   On: 06/28/2022 16:56    Pending Labs Unresulted Labs (From admission, onward)     Start     Ordered   06/29/22 0500  CBC  Tomorrow morning,   R        06/28/22 1834   06/29/22 0500  Basic metabolic panel  Tomorrow morning,   R        06/28/22 1834   06/28/22 2200  Hemoglobin and hematocrit, blood  Once-Timed,   TIMED        06/28/22 1855   06/28/22 1839  Hemoglobin A1c  Once,   R       Comments: To assess prior glycemic control    06/28/22 1838   06/28/22 1838  Magnesium  Add-on,   AD        06/28/22 1837   06/28/22 1838  Phosphorus  Add-on,   AD  06/28/22 1837   06/28/22 1835  Ferritin  Add-on,   AD        06/28/22 1834   06/28/22 1835   Reticulocytes  Add-on,   AD        06/28/22 1834   06/28/22 1834  Iron and TIBC  Add-on,   AD        06/28/22 1834   06/28/22 1322  Gastrointestinal Panel by PCR , Stool  (Gastrointestinal Panel by PCR, Stool                                                                                                                                                     **Does Not include CLOSTRIDIUM DIFFICILE testing. **If CDIFF testing is needed, place order from the "C Difficile Testing" order set.**)  Once,   URGENT        06/28/22 1321   06/28/22 1247  Urinalysis, w/ Reflex to Culture (Infection Suspected) -Urine, Clean Catch  Once,   URGENT       Question:  Specimen Source  Answer:  Urine, Clean Catch   06/28/22 1258            Vitals/Pain Today's Vitals   06/28/22 1300 06/28/22 1315 06/28/22 1400 06/28/22 1430  BP: 124/60 108/63 118/63 134/68  Pulse:      Resp: (!) 21 19 17 18   Temp:      TempSrc:      SpO2:      Weight:      Height:        Isolation Precautions Enteric precautions (UV disinfection)  Medications Medications  vancomycin (VANCOCIN) capsule 125 mg (has no administration in time range)  lactated ringers infusion (has no administration in time range)  metoprolol succinate (TOPROL-XL) 24 hr tablet 12.5 mg (has no administration in time range)  rosuvastatin (CRESTOR) tablet 10 mg (has no administration in time range)  FLUoxetine (PROZAC) capsule 20 mg (has no administration in time range)  levothyroxine (SYNTHROID) tablet 75 mcg (has no administration in time range)  pantoprazole (PROTONIX) EC tablet 40 mg (has no administration in time range)  ferrous sulfate tablet 325 mg (has no administration in time range)  brimonidine-timolol (COMBIGAN) 0.2-0.5 % ophthalmic solution 1 drop (has no administration in time range)  potassium chloride SA (KLOR-CON M) CR tablet 40 mEq (has no administration in time range)  HYDROmorphone (DILAUDID) injection 0.5-1 mg (has no administration  in time range)  ondansetron (ZOFRAN) tablet 4 mg (has no administration in time range)    Or  ondansetron (ZOFRAN) injection 4 mg (has no administration in time range)  hydrALAZINE (APRESOLINE) injection 5 mg (has no administration in time range)  insulin aspart (novoLOG) injection 0-9 Units (has no administration in time range)  lactated ringers bolus 1,000 mL (1,000 mLs Intravenous Bolus 06/28/22 1335)  potassium chloride SA (  KLOR-CON M) CR tablet 40 mEq (40 mEq Oral Given 06/28/22 1550)    Mobility Unsure      Focused Assessments Loose stools x 5 days incontinent    R Recommendations: See Admitting Provider Note  Report given to:   Additional Notes: lives in an independent facility with private CNA

## 2022-06-28 NOTE — ED Provider Notes (Signed)
[place] EMERGENCY DEPARTMENT AT [facility] Provider Note   CSN: [id number] Arrival date & time: 06/28/22  1218     History  Chief Complaint  Patient presents with   Diarrhea    Diana [name] is a 89 y.o. female.  Patient is an 89 year old female with a history of dementia, hypertension, hyperlipidemia, anemia, hypothyroidism, GERD, diabetes, renal insufficiency, atrial fibrillation who does not take anticoagulation presenting today with diarrhea.  The patient is here with her caretaker who states that Diana [name] has been having at least 4 loose stools for the past 5 days.  Diana [name] states that Diana [name] has not had any black or bloody stools.  Diana [name] states that Diana [name] has had associated lower abdominal pain.  Diana [name] denies any fevers, nausea or vomiting.  Diana [name] states that Diana [name] did recently take antibiotics for a UTI.  Her caretaker is unsure if Diana [name] has completed that prescription.  The history is provided by a caregiver.  Diarrhea      Home Medications Prior to Admission medications   Medication Sig Start Date End Date Taking? Authorizing Provider  amLODipine (NORVASC) 5 MG tablet Take 5 mg by mouth in the morning.    [provider]  amoxicillin-clavulanate (AUGMENTIN) 875-125 MG tablet Take 1 tablet by mouth every 12 (twelve) hours. 05/16/22   Gwyneth Sprout, MD  brimonidine-timolol (COMBIGAN) 0.2-0.5 % ophthalmic solution Place 1 drop into both eyes every 12 (twelve) hours. 05/26/20   Medina-Vargas, Monina C, NP  cephALEXin (KEFLEX) 250 MG capsule Take 250 mg by mouth at bedtime. Prophylactic.    [provider]  Cholecalciferol (VITAMIN D) 50 MCG (2000 UT) tablet Take 2,000 Units by mouth in the morning.    [provider]  ciprofloxacin (CIPRO) 500 MG tablet Take 1 tablet (500 mg total) by mouth 2 (two) times daily. One po bid x 7 days 06/10/22   Bethann Berkshire, MD  Cranberry 500 MG CAPS Take 500 mg by mouth 2 (two) times daily.    [provider]   ferrous sulfate 325 (65 FE) MG EC tablet Take 325 mg by mouth in the morning.    [provider]  FLUoxetine (PROZAC) 40 MG capsule Take 1 capsule by mouth in the morning. 01/07/22   [provider]  furosemide (LASIX) 20 MG tablet Take 1 tablet (20 mg total) by mouth daily as needed (shortness of breath, take for 5 days). Patient not taking: Reported on 03/15/2022 10/24/21 10/19/22  Jodelle Red, MD  insulin glargine (LANTUS) 100 UNIT/ML injection Inject 8 Units into the skin at bedtime. Patient not taking: Reported on 03/15/2022    [provider]  levothyroxine (SYNTHROID) 75 MCG tablet Take 1 tablet by mouth in the morning. 01/14/22   [provider]  losartan (COZAAR) 50 MG tablet Take 1 tablet (50 mg total) by mouth 2 (two) times daily. 05/26/20   Medina-Vargas, Monina C, NP  metFORMIN (GLUCOPHAGE) 500 MG tablet Take 500 mg by mouth 2 (two) times daily.    [provider]  metoprolol succinate (TOPROL-XL) 25 MG 24 hr tablet Take 0.5 tablets (12.5 mg total) by mouth daily. Take with or immediately following a meal. Patient taking differently: Take 12.5 mg by mouth in the morning. 12/10/21 12/05/22  Jodelle Red, MD  Multiple Vitamins-Minerals (PRESERVISION AREDS 2 PO) Take 1 capsule by mouth in the morning.    [provider]  ondansetron (ZOFRAN-ODT) 4 MG disintegrating tablet Take 1 tablet (4 mg total) by mouth every 8 (  eight) hours as needed for nausea or vomiting. Patient not taking: Reported on 03/15/2022 12/16/21   Smoot, Shawn Route, PA-C  pantoprazole (PROTONIX) 40 MG tablet Take 1 tablet (40 mg total) by mouth 2 (two) times daily before a meal. 05/26/20   Medina-Vargas, Monina C, NP  rosuvastatin (CRESTOR) 10 MG tablet Take 10 mg by mouth at bedtime.    [provider]      Allergies    Codeine sulfate, Sps [sodium polystyrene sulfonate], and Sulfa antibiotics    Review of Systems   Review of Systems   Gastrointestinal:  Positive for diarrhea.    Physical Exam Updated Vital Signs BP 134/68   Pulse 71   Temp 98.6 F (37 C) (Oral)   Resp 18   Ht  (1.727 m)   Wt 65.8 kg   LMP  (LMP Unknown)   SpO2 96%   BMI 22.05 kg/m  Physical Exam Vitals and nursing note reviewed.  Constitutional:      General: Diana [name] is not in acute distress.    Comments: Drowsy, arousable to verbal stimulation  HENT:     Head: Normocephalic and atraumatic.     Nose: Nose normal.     Mouth/Throat:     Mouth: Mucous membranes are moist.     Pharynx: Oropharynx is clear.  Eyes:     Extraocular Movements: Extraocular movements intact.     Conjunctiva/sclera: Conjunctivae normal.  Cardiovascular:     Rate and Rhythm: Normal rate and regular rhythm.     Heart sounds: Normal heart sounds.  Pulmonary:     Effort: Pulmonary effort is normal.     Breath sounds: Normal breath sounds.  Abdominal:     General: Abdomen is flat.     Palpations: Abdomen is soft.     Tenderness: There is abdominal tenderness (RLQ). There is guarding. There is no rebound.  Genitourinary:    Rectum: Guaiac stool: Brown watery stool in diaper.  Musculoskeletal:        General: Normal range of motion.     Cervical back: Normal range of motion and neck supple.  Skin:    General: Skin is warm and dry.  Neurological:     General: No focal deficit present.     Mental Status: Diana [name] is alert. Mental status is at baseline.  Psychiatric:        Mood and Affect: Mood normal.        Behavior: Behavior normal.     ED Results / Procedures / Treatments   Labs (all labs ordered are listed, but only abnormal results are displayed) Labs Reviewed  CBC WITH DIFFERENTIAL/PLATELET - Abnormal; Notable for the following components:      Result Value   RBC 3.09 (*)    Hemoglobin 9.1 (*)    HCT 29.4 (*)    All other components within normal limits  COMPREHENSIVE METABOLIC PANEL - Abnormal; Notable for the following components:   Potassium  3.1 (*)    Chloride 115 (*)    CO2 19 (*)    Glucose, Bld 142 (*)    BUN 27 (*)    Creatinine, Ser 1.70 (*)    Calcium 8.4 (*)    Total Protein 5.3 (*)    Albumin 2.3 (*)    GFR, Estimated 29 (*)    All other components within normal limits  C DIFFICILE QUICK SCREEN W PCR REFLEX    GASTROINTESTINAL PANEL BY PCR, STOOL (REPLACES STOOL CULTURE)  LIPASE, BLOOD  URINALYSIS, W/ REFLEX TO CULTURE (INFECTION SUSPECTED)  CBG MONITORING, ED  POC OCCULT BLOOD, ED    EKG None  Radiology No results found.  Procedures Procedures    Medications Ordered in ED Medications  potassium chloride SA (KLOR-CON M) CR tablet 40 mEq (has no administration in time range)  lactated ringers bolus 1,000 mL (1,000 mLs Intravenous Bolus 06/28/22 1335)    ED Course/ Medical Decision Making/ A&P Clinical Course as of 06/28/22 1544  Sat Jun 28, 2022  1417 Mild dehydration on labs and potassium will be repleted. Hgb 9.1 from baseline of 11. Hemoccult will be performed. [VK]  1544 Patient signed out to Dr. Posey Rea pending CT and stool studies for disposition. [VK]    Clinical Course User Index [VK] Rexford Maus, DO                             Medical Decision Making This patient presents to the ED with chief complaint(s) of diarrhea, abd pain with pertinent past medical history of dementia, HTN, HLD, anemia, GERD, DM, a fib not on [facility] which further complicates the presenting complaint. The complaint involves an extensive differential diagnosis and also carries with it a high risk of complications and morbidity.    The differential diagnosis includes colitis, gastroenteritis, dehydration, C. difficile, other infectious diarrhea, appendicitis or other intra-abdominal infection, anemia, GI bleed  Additional history obtained: Additional history obtained from caregiver Records reviewed Care Everywhere/External Records and Primary Care Documents  ED Course and Reassessment: On patient's arrival to  the emergency department Diana [name] was drowsy but arousable to verbal stimulation and at her neurologic baseline per her caregiver.  The patient did have right lower quadrant tenderness, concerning for appendicitis or other intra-abdominal infection and will have CT abdomen and pelvis performed.  Diana [name] will be started on IV fluids for concern for dehydration and will have labs performed to evaluate for cause of her diarrhea.  Diana [name] will be closely reassessed.  Independent labs interpretation:  The following labs were independently interpreted: Mild hypokalemia and mildly low bicarb likely with mild dehydration, creatinine at baseline  Independent visualization of imaging: - Pending     Amount and/or Complexity of Data Reviewed Labs: ordered. Radiology: ordered.  Risk Prescription drug management.          Final Clinical Impression(s) / ED Diagnoses Final diagnoses:  None    Rx / DC Orders ED Discharge Orders     None         Rexford Maus, DO 06/28/22 1544

## 2022-06-28 NOTE — ED Triage Notes (Signed)
Caregiver here estimates about 3 pudding like stools every 12 hr shit

## 2022-06-28 NOTE — ED Notes (Signed)
Large loose stool cleaned and changed linens

## 2022-06-28 NOTE — ED Notes (Signed)
Patient transported to CT 

## 2022-06-28 NOTE — ED Provider Notes (Signed)
  Physical Exam  BP 134/68   Pulse 71   Temp 98.6 F (37 C) (Oral)   Resp 18   Ht  (1.727 m)   Wt 65.8 kg   LMP  (LMP Unknown)   SpO2 96%   BMI 22.05 kg/m   Physical Exam Vitals and nursing note reviewed.  Constitutional:      General: She is not in acute distress.    Appearance: She is well-developed.  HENT:     Head: Normocephalic and atraumatic.  Eyes:     Conjunctiva/sclera: Conjunctivae normal.  Cardiovascular:     Rate and Rhythm: Normal rate and regular rhythm.     Heart sounds: No murmur heard. Pulmonary:     Effort: Pulmonary effort is normal. No respiratory distress.  Musculoskeletal:        General: No swelling.     Cervical back: Neck supple.  Skin:    General: Skin is warm and dry.     Capillary Refill: Capillary refill takes less than 2 seconds.  Neurological:     Mental Status: She is alert.  Psychiatric:        Mood and Affect: Mood normal.     Procedures  Procedures  ED Course / MDM   Clinical Course as of 06/28/22 1812  Sat Jun 28, 2022  1417 Mild dehydration on labs and potassium will be repleted. Hgb 9.1 from baseline of 11. Hemoccult will be performed. [VK]  1544 Patient signed out to Dr. Posey Rea pending CT and stool studies for disposition. [VK]    Clinical Course User Index [VK] Rexford Maus, DO   Medical Decision Making Amount and/or Complexity of Data Reviewed Labs: ordered. Radiology: ordered.  Risk Prescription drug management.   Patient received in handoff.  Persistent diarrhea, difficulty tolerating p.o.  New anemia.  Pending CT and stool studies.  CT with uncomplicated diverticulitis and stool studies positive for C. difficile.  Oral vancomycin started.  With difficulty tolerating p.o., multiple intra-abdominal infections and new anemia, patient would benefit from rehydration in the inpatient setting.  Patient admitted.       Glendora Score, MD 06/28/22 [phone]

## 2022-06-28 NOTE — H&P (Addendum)
History and Physical    Diana [name] ZOX:[id number] DOB: 02-24-1934 DOA: 06/28/2022  PCP: Mattie Marlin, DO (Confirm with patient/family/NH records and if not entered, this has to be entered at [facility] point of entry) Patient coming from: Home  I have personally briefly reviewed patient's old medical records in [facility]  Chief Complaint: Abdominal pain, diarrhea  HPI: Diana [name] is a 89 y.o. female with medical history significant of recurrent UTIs on prophylactic antibiotics, CKD stage IV, chronic iron deficiency anemia, HTN, IDDM, hypothyroidism, memory loss, OSA on CPAP, brought in by caregiver for evaluation of persistent diarrhea.  Patient received several rounds of p.o. antibiotics since January this year to treat virus infections including colitis in January and March, recurrent UTI 3 weeks ago and a more recent fungal infection with fluconazole completed about 1 week ago.  And patient has been started on prophylactic Keflex for frequent UTIs recently as well.  5 days ago, patient started to have abdominal cramping pain and diarrhea, initially was loose then became more watery for the last 3 days, no fever or chills.  Abdominal pain has been periumbilical, denies any tenesmus.  Denies any nauseous vomiting but patient has been having significant anorexia and did not eat or drink much for the last 3+ days became dehydrated and very weak.  ED Course: Patient was found afebrile, none tachycardia, blood pressure borderline low, nonhypoxic.  CT abdomen pelvis showed acute uncomplicated diverticulitis of the descending and sigmoid colon. Similar to the previous study in March.  Stool study positive for C. difficile toxin and antigen.  Blood work showed WBC 5.8, hemoglobin 9.9 compared to baseline 11.9 about 1 month ago.  BUN 27, creatinine 1.7 compatible with baseline.  Potassium 3.1 glucose 142.  Infection disease was consulted in the ED recommended single antibiotic  treatment with p.o. vancomycin.  Review of Systems: As per HPI otherwise 14 point review of systems negative.    Past Medical History:  Diagnosis Date   Anemia    Anemia of decreased vitamin B12 absorption 05/26/2005   Arthritis    Atrial fibrillation    Breast cancer 2001   Cataract    GERD (gastroesophageal reflux disease)    Hiatal hernia    History of tobacco abuse    Hx of colonic polyps    Hyperlipidemia    Hypertension    Hypothyroidism    Memory loss    Obesity    OSA on CPAP 09/2007   AHI 10.6/hr overall, 43.64/hr during REM, lost weight, does not use cpap   Osteopenia    Renal insufficiency    Type 2 diabetes mellitus     Past Surgical History:  Procedure Laterality Date   BACK SURGERY  05/30/2009   BIOPSY  01/01/2019   Procedure: BIOPSY;  Surgeon: Rachael Fee, MD;  Location: [facility] ENDOSCOPY;  Service: Endoscopy;;   BOWEL RESECTION  1974   CATARACT EXTRACTION Bilateral    bilateral   CHOLECYSTECTOMY  1974   COLONOSCOPY     ESOPHAGOGASTRODUODENOSCOPY N/A 01/01/2019   Procedure: ESOPHAGOGASTRODUODENOSCOPY (EGD);  Surgeon: Rachael Fee, MD;  Location: [facility] ENDOSCOPY;  Service: Endoscopy;  Laterality: N/A;   EXTERNAL FIXATION REMOVAL Left 04/27/2020   Procedure: REMOVAL EXTERNAL FIXATION ARM;  Surgeon: Roby Lofts, MD;  Location: [facility] OR;  Service: Orthopedics;  Laterality: Left;   MASTECTOMY Bilateral 2001   bilateral sentinel lymph nodes bio   NM MYOCAR PERF WALL MOTION  06/2007   dipyridamole; perfusion defect  in inferior myocardium consistent with diaphragmatic attenuation, remaining myocardium with no ischemia/infarct, EF 73%; normal, low risk scan    ORIF ELBOW FRACTURE Left 03/21/2020   Procedure: OPEN REDUCTION INTERNAL FIXATION (ORIF) ELBOW/OLECRANON FRACTURE;  Surgeon: Roby Lofts, MD;  Location: [facility] OR;  Service: Orthopedics;  Laterality: Left;   TOTAL ABDOMINAL HYSTERECTOMY  1975   TRANSTHORACIC ECHOCARDIOGRAM  02/2010   EF=>55%, stage 1  diastolic dysfunction; borderline RV enlargement; LA mild-mod dilated; mild mitral annular calcif & mild MR; mild TR with normal RSVP, AV moderately sclerotics     reports that she quit smoking about 20 years ago. Her smoking use included cigarettes. She has never used smokeless tobacco. She reports that she does not drink alcohol and does not use drugs.  Allergies  Allergen Reactions   Codeine Sulfate Nausea Only   Sps [Sodium Polystyrene Sulfonate] Other (See Comments)    Unknown reaction   Sulfa Antibiotics Other (See Comments)    Unknown reaction    Family History  Problem Relation Age of Onset   Heart attack Mother    Heart attack Father    Colon cancer Neg Hx      Prior to Admission medications   Medication Sig Start Date End Date Taking? Authorizing Provider  amLODipine (NORVASC) 5 MG tablet Take 5 mg by mouth in the morning.    [provider]  amoxicillin-clavulanate (AUGMENTIN) 875-125 MG tablet Take 1 tablet by mouth every 12 (twelve) hours. 05/16/22   Gwyneth Sprout, MD  brimonidine-timolol (COMBIGAN) 0.2-0.5 % ophthalmic solution Place 1 drop into both eyes every 12 (twelve) hours. 05/26/20   Medina-Vargas, Monina C, NP  cephALEXin (KEFLEX) 250 MG capsule Take 250 mg by mouth at bedtime. Prophylactic.    [provider]  Cholecalciferol (VITAMIN D) 50 MCG (2000 UT) tablet Take 2,000 Units by mouth in the morning.    [provider]  ciprofloxacin (CIPRO) 500 MG tablet Take 1 tablet (500 mg total) by mouth 2 (two) times daily. One po bid x 7 days 06/10/22   Bethann Berkshire, MD  Cranberry 500 MG CAPS Take 500 mg by mouth 2 (two) times daily.    [provider]  ferrous sulfate 325 (65 FE) MG EC tablet Take 325 mg by mouth in the morning.    [provider]  FLUoxetine (PROZAC) 40 MG capsule Take 1 capsule by mouth in the morning. 01/07/22   [provider]  furosemide (LASIX) 20 MG tablet Take 1 tablet (20 mg total) by  mouth daily as needed (shortness of breath, take for 5 days). Patient not taking: Reported on 03/15/2022 10/24/21 10/19/22  Jodelle Red, MD  insulin glargine (LANTUS) 100 UNIT/ML injection Inject 8 Units into the skin at bedtime. Patient not taking: Reported on 03/15/2022    [provider]  levothyroxine (SYNTHROID) 75 MCG tablet Take 1 tablet by mouth in the morning. 01/14/22   [provider]  losartan (COZAAR) 50 MG tablet Take 1 tablet (50 mg total) by mouth 2 (two) times daily. 05/26/20   Medina-Vargas, Monina C, NP  metFORMIN (GLUCOPHAGE) 500 MG tablet Take 500 mg by mouth 2 (two) times daily.    [provider]  metoprolol succinate (TOPROL-XL) 25 MG 24 hr tablet Take 0.5 tablets (12.5 mg total) by mouth daily. Take with or immediately following a meal. Patient taking differently: Take 12.5 mg by mouth in the morning. 12/10/21 12/05/22  Jodelle Red, MD  Multiple Vitamins-Minerals (PRESERVISION AREDS 2 PO) Take 1 capsule  by mouth in the morning.    [provider]  ondansetron (ZOFRAN-ODT) 4 MG disintegrating tablet Take 1 tablet (4 mg total) by mouth every 8 (eight) hours as needed for nausea or vomiting. Patient not taking: Reported on 03/15/2022 12/16/21   Smoot, Shawn Route, PA-C  pantoprazole (PROTONIX) 40 MG tablet Take 1 tablet (40 mg total) by mouth 2 (two) times daily before a meal. 05/26/20   Medina-Vargas, Monina C, NP  rosuvastatin (CRESTOR) 10 MG tablet Take 10 mg by mouth at bedtime.    [provider]    Physical Exam: Vitals:   06/28/22 1300 06/28/22 1315 06/28/22 1400 06/28/22 1430  BP: 124/60 108/63 118/63 134/68  Pulse:      Resp: (!) Temp:      TempSrc:      SpO2:      Weight:      Height:        Constitutional: NAD, calm, comfortable Vitals:   06/28/22 1300 06/28/22 1315 06/28/22 1400 06/28/22 1430  BP: 124/60 108/63 118/63 134/68  Pulse:      Resp: (!) Temp:      TempSrc:       SpO2:      Weight:      Height:       Eyes: PERRL, lids and conjunctivae normal ENMT: Mucous membranes are dry. Posterior pharynx clear of any exudate or lesions.Normal dentition.  Neck: normal, supple, no masses, no thyromegaly Respiratory: clear to auscultation bilaterally, no wheezing, no crackles. Normal respiratory effort. No accessory muscle use.  Cardiovascular: Regular rate and rhythm, no murmurs / rubs / gallops. No extremity edema. 2+ pedal pulses. No carotid bruits.  Abdomen: mild tenderness on periumbilical area, no rebound no guarding, no masses palpated. No hepatosplenomegaly. Bowel sounds positive.  Musculoskeletal: no clubbing / cyanosis. No joint deformity upper and lower extremities. Good ROM, no contractures. Normal muscle tone.  Skin: no rashes, lesions, ulcers. No induration Neurologic: CN 2-12 grossly intact. Sensation intact, DTR normal. Strength 5/5 in all 4.  Psychiatric: Normal judgment and insight. Alert and oriented x 3. Normal mood.    Labs on Admission: I have personally reviewed following labs and imaging studies  CBC: Recent Labs  Lab 06/28/22 1341  WBC 5.8  NEUTROABS 4.0  HGB 9.1*  HCT 29.4*  MCV 95.1  PLT 251   Basic Metabolic Panel: Recent Labs  Lab 06/28/22 1341  NA 142  K 3.1*  CL 115*  CO2 19*  GLUCOSE 142*  BUN 27*  CREATININE 1.70*  CALCIUM 8.4*   GFR: Estimated Creatinine Clearance: 24 mL/min (A) (by C-G formula based on SCr of 1.7 mg/dL (H)). Liver Function Tests: Recent Labs  Lab 06/28/22 1341  AST 15  ALT 14  ALKPHOS 52  BILITOT 0.4  PROT 5.3*  ALBUMIN 2.3*   Recent Labs  Lab 06/28/22 1341  LIPASE 29   No results for input(s): "AMMONIA" in the last 168 hours. Coagulation Profile: No results for input(s): "INR", "PROTIME" in the last 168 hours. Cardiac Enzymes: No results for input(s): "CKTOTAL", "CKMB", "CKMBINDEX", "TROPONINI" in the last 168 hours. BNP (last 3 results) No results for input(s): "PROBNP"  in the last 8760 hours. HbA1C: No results for input(s): "HGBA1C" in the last 72 hours. CBG: No results for input(s): "GLUCAP" in the last 168 hours. Lipid Profile: No results for input(s): "CHOL", "HDL", "LDLCALC", "TRIG", "CHOLHDL", "LDLDIRECT" in the last 72 hours. Thyroid Function Tests:  No results for input(s): "TSH", "T4TOTAL", "FREET4", "T3FREE", "THYROIDAB" in the last 72 hours. Anemia Panel: No results for input(s): "VITAMINB12", "FOLATE", "FERRITIN", "TIBC", "IRON", "RETICCTPCT" in the last 72 hours. Urine analysis:    Component Value Date/Time   COLORURINE AMBER (A) 06/10/2022 1147   APPEARANCEUR TURBID (A) 06/10/2022 1147   LABSPEC 1.014 06/10/2022 1147   PHURINE 5.0 06/10/2022 1147   GLUCOSEU NEGATIVE 06/10/2022 1147   HGBUR NEGATIVE 06/10/2022 1147   BILIRUBINUR NEGATIVE 06/10/2022 1147   KETONESUR NEGATIVE 06/10/2022 1147   PROTEINUR 100 (A) 06/10/2022 1147   UROBILINOGEN 0.2 07/05/2009 0331   NITRITE NEGATIVE 06/10/2022 1147   LEUKOCYTESUR MODERATE (A) 06/10/2022 1147    Radiological Exams on Admission: CT ABDOMEN PELVIS WO CONTRAST  Result Date: 06/28/2022 CLINICAL DATA:  Right lower quadrant abdominal pain. EXAM: CT ABDOMEN AND PELVIS WITHOUT CONTRAST TECHNIQUE: Multidetector CT imaging of the abdomen and pelvis was performed following the standard protocol without IV contrast. RADIATION DOSE REDUCTION: This exam was performed according to the departmental dose-optimization program which includes automated exposure control, adjustment of the mA and/or kV according to patient size and/or use of iterative reconstruction technique. COMPARISON:  CT abdomen pelvis dated 05/16/2022. FINDINGS: Lower chest: There is a moderate right and small left pleural effusion with associated atelectasis. The heart is mildly enlarged. There is a large hiatal hernia. Hepatobiliary: No focal liver abnormality is seen. Status post cholecystectomy. No biliary dilatation. Pancreas:  Unremarkable. No pancreatic ductal dilatation or surrounding inflammatory changes. Spleen: Normal in size without focal abnormality. Adrenals/Urinary Tract: Adrenal glands are unremarkable. There is a 3 mm calculus in the left kidney which is likely nonobstructive. No renal calculi is seen on the right. Left renal cysts are redemonstrated. No imaging follow-up is recommended for this finding. Both kidneys are atrophic. Bladder is unremarkable. Stomach/Bowel: Stomach is within normal limits. The appendix is not definitely identified, however no pericecal inflammatory changes are noted to suggest acute appendicitis. There is colonic diverticulosis with wall thickening and associated fat stranding involving the descending and sigmoid colon, similar to prior exam. No definite abscess formation or fistula formation. No evidence of bowel obstruction. Vascular/Lymphatic: Aortic atherosclerosis. No enlarged abdominal or pelvic lymph nodes. Reproductive: Status post hysterectomy. No adnexal masses. Other: There is a small fat containing paraumbilical hernia. No significant free intraperitoneal fluid. Musculoskeletal: Severe degenerative changes are seen in the spine. Vertebroplasty changes at L1 are redemonstrated with 10 mm retropulsion into the central canal at this level with moderate to severe central canal stenosis, unchanged. IMPRESSION: 1. Acute uncomplicated diverticulitis of the descending and sigmoid colon, similar to prior. 2. Moderate right and small left pleural effusions with associated atelectasis. Aortic Atherosclerosis (ICD10-I70.0). Electronically Signed   By: Romona Curls M.D.   On: 06/28/2022 16:56    EKG: Pending  Assessment/Plan Principal Problem:   Colitis due to Clostridium difficile Active Problems:   Hypoglycemia   CKD (chronic kidney disease) stage 3, GFR 30-59 ml/min   C. difficile colitis   Iron deficiency anemia   Hypokalemia  (please populate well all problems here in Problem  List. (For example, if patient is on BP meds at home and you resume or decide to hold them, it is a problem that needs to be her. Same for CAD, COPD, HLD and so on)  C. difficile colitis -calculated ATLAS score=5 with a cure rate of 76% and mortality of 8.7% -As per recommended by ID, will continue PO Vanco Q6H, expect 10 days course -Recommend outpatient GI f/u  in 4-6 weeks for possible colonoscope  Acute recurrent diverticulitis -As per ID, this more likely represent part of the c. Diff infection and no additional ABX indicated rather than PO Vanc  Severe volume contraction and dehydration  -2/2 GI loss from c diff colitis -IVF,  -Hold off some of her BP meds including diuresis of ARB and Lasix  Hypokalemia -PO replacement -Recheck level in AM -Mg and Phos pending  Acute on chronic iron deficiency anemia -likely also related to acute c diff colitis and diverticulitis -No active bleeding in the ED -Recheck H/H tonight and in AM -Transfuse for hemodynamic instablitiy -Iron level sent  Frequent PVCs -Probably related to acute hypokalemia -PO replacement -Tele monitoring -Mg pending  HTN Hold of home BP meds -Start PRN Hydralazine -Continue metoprolol  IDDM -SSI  CKD stage IV -Volume contracted and Creatine level stable -Hold off BP meds.  Hypothyroid -Continue Synthroid  OSA -CPAP HS   DVT prophylaxis: SCD Code Status: Full code Family Communication: Caregiver at bedside Disposition Plan: Expect less than 2 midnight hospital stay Consults called: ID Admission status: Tele obs   Emeline General MD [facility] Pager [phone]  06/28/2022, 6:38 PM

## 2022-06-28 NOTE — ED Triage Notes (Signed)
Called by facility for diarrhea x 3  seen by personal caregiver and what was reported to EMS as a "Speck of blood" agitation and dementia at baseline alert to name only

## 2022-06-29 DIAGNOSIS — G4733 Obstructive sleep apnea (adult) (pediatric): Secondary | ICD-10-CM | POA: Diagnosis present

## 2022-06-29 DIAGNOSIS — F0393 Unspecified dementia, unspecified severity, with mood disturbance: Secondary | ICD-10-CM | POA: Diagnosis present

## 2022-06-29 DIAGNOSIS — A0472 Enterocolitis due to Clostridium difficile, not specified as recurrent: Secondary | ICD-10-CM | POA: Diagnosis not present

## 2022-06-29 DIAGNOSIS — E039 Hypothyroidism, unspecified: Secondary | ICD-10-CM | POA: Diagnosis present

## 2022-06-29 DIAGNOSIS — E785 Hyperlipidemia, unspecified: Secondary | ICD-10-CM | POA: Diagnosis present

## 2022-06-29 DIAGNOSIS — Z794 Long term (current) use of insulin: Secondary | ICD-10-CM | POA: Diagnosis not present

## 2022-06-29 DIAGNOSIS — N184 Chronic kidney disease, stage 4 (severe): Secondary | ICD-10-CM | POA: Diagnosis present

## 2022-06-29 DIAGNOSIS — K219 Gastro-esophageal reflux disease without esophagitis: Secondary | ICD-10-CM | POA: Diagnosis present

## 2022-06-29 DIAGNOSIS — I4891 Unspecified atrial fibrillation: Secondary | ICD-10-CM | POA: Diagnosis present

## 2022-06-29 DIAGNOSIS — R413 Other amnesia: Secondary | ICD-10-CM | POA: Diagnosis present

## 2022-06-29 DIAGNOSIS — D509 Iron deficiency anemia, unspecified: Secondary | ICD-10-CM | POA: Diagnosis present

## 2022-06-29 DIAGNOSIS — K5732 Diverticulitis of large intestine without perforation or abscess without bleeding: Secondary | ICD-10-CM | POA: Diagnosis present

## 2022-06-29 DIAGNOSIS — I493 Ventricular premature depolarization: Secondary | ICD-10-CM | POA: Diagnosis present

## 2022-06-29 DIAGNOSIS — K5792 Diverticulitis of intestine, part unspecified, without perforation or abscess without bleeding: Secondary | ICD-10-CM | POA: Diagnosis present

## 2022-06-29 DIAGNOSIS — T3695XA Adverse effect of unspecified systemic antibiotic, initial encounter: Secondary | ICD-10-CM | POA: Diagnosis present

## 2022-06-29 DIAGNOSIS — E876 Hypokalemia: Secondary | ICD-10-CM | POA: Diagnosis present

## 2022-06-29 DIAGNOSIS — I2510 Atherosclerotic heart disease of native coronary artery without angina pectoris: Secondary | ICD-10-CM | POA: Diagnosis present

## 2022-06-29 DIAGNOSIS — M8580 Other specified disorders of bone density and structure, unspecified site: Secondary | ICD-10-CM | POA: Diagnosis present

## 2022-06-29 DIAGNOSIS — E1122 Type 2 diabetes mellitus with diabetic chronic kidney disease: Secondary | ICD-10-CM | POA: Diagnosis present

## 2022-06-29 DIAGNOSIS — I129 Hypertensive chronic kidney disease with stage 1 through stage 4 chronic kidney disease, or unspecified chronic kidney disease: Secondary | ICD-10-CM | POA: Diagnosis present

## 2022-06-29 DIAGNOSIS — F32A Depression, unspecified: Secondary | ICD-10-CM | POA: Diagnosis present

## 2022-06-29 DIAGNOSIS — E11649 Type 2 diabetes mellitus with hypoglycemia without coma: Secondary | ICD-10-CM | POA: Diagnosis present

## 2022-06-29 DIAGNOSIS — E860 Dehydration: Secondary | ICD-10-CM | POA: Diagnosis present

## 2022-06-29 DIAGNOSIS — Z7409 Other reduced mobility: Secondary | ICD-10-CM | POA: Diagnosis present

## 2022-06-29 DIAGNOSIS — F0394 Unspecified dementia, unspecified severity, with anxiety: Secondary | ICD-10-CM | POA: Diagnosis present

## 2022-06-29 LAB — GLUCOSE, CAPILLARY
Glucose-Capillary: 121 mg/dL — ABNORMAL HIGH (ref 70–99)
Glucose-Capillary: 78 mg/dL (ref 70–99)
Glucose-Capillary: 98 mg/dL (ref 70–99)
Glucose-Capillary: 141 mg/dL — ABNORMAL HIGH (ref 70–99)
Glucose-Capillary: 142 mg/dL — ABNORMAL HIGH (ref 70–99)

## 2022-06-29 LAB — CBC
HCT: 28.1 % — ABNORMAL LOW (ref 36.0–46.0)
Hemoglobin: 8.7 g/dL — ABNORMAL LOW (ref 12.0–15.0)
MCH: 29.3 pg (ref 26.0–34.0)
MCHC: 31 g/dL (ref 30.0–36.0)
MCV: 94.6 fL (ref 80.0–100.0)
Platelets: 244 10*3/uL (ref 150–400)
RBC: 2.97 MIL/uL — ABNORMAL LOW (ref 3.87–5.11)
RDW: 15.4 % (ref 11.5–15.5)
WBC: 4.2 10*3/uL (ref 4.0–10.5)
nRBC: 0 % (ref 0.0–0.2)

## 2022-06-29 LAB — URINALYSIS, W/ REFLEX TO CULTURE (INFECTION SUSPECTED)
Bilirubin Urine: NEGATIVE
Glucose, UA: NEGATIVE mg/dL
Hgb urine dipstick: NEGATIVE
Ketones, ur: NEGATIVE mg/dL
Nitrite: NEGATIVE
Protein, ur: 100 mg/dL — AB
Specific Gravity, Urine: 1.013 (ref 1.005–1.030)
WBC, UA: >50 WBC/hpf (ref 0–5)
pH: 7 (ref 5.0–8.0)

## 2022-06-29 LAB — BASIC METABOLIC PANEL
Anion gap: 8 (ref 5–15)
BUN: 25 mg/dL — ABNORMAL HIGH (ref 8–23)
CO2: 17 mmol/L — ABNORMAL LOW (ref 22–32)
Calcium: 8.3 mg/dL — ABNORMAL LOW (ref 8.9–10.3)
Chloride: 115 mmol/L — ABNORMAL HIGH (ref 98–111)
Creatinine, Ser: 1.65 mg/dL — ABNORMAL HIGH (ref 0.44–1.00)
GFR, Estimated: 30 mL/min — ABNORMAL LOW (ref >60)
Glucose, Bld: 100 mg/dL — ABNORMAL HIGH (ref 70–99)
Potassium: 4.3 mmol/L (ref 3.5–5.1)
Sodium: 140 mmol/L (ref 135–145)

## 2022-06-29 LAB — IRON AND TIBC
Iron: 20 ug/dL — ABNORMAL LOW (ref 28–170)
Saturation Ratios: 12 % (ref 10.4–31.8)
TIBC: 165 ug/dL — ABNORMAL LOW (ref 250–450)
UIBC: 145 ug/dL

## 2022-06-29 LAB — MAGNESIUM: Magnesium: 2 mg/dL (ref 1.7–2.4)

## 2022-06-29 LAB — GASTROINTESTINAL PANEL BY PCR, STOOL (REPLACES STOOL CULTURE)

## 2022-06-29 LAB — RETICULOCYTES
Immature Retic Fract: 22.9 % — ABNORMAL HIGH (ref 2.3–15.9)
RBC.: 2.91 MIL/uL — ABNORMAL LOW (ref 3.87–5.11)
Retic Count, Absolute: 55 10*3/uL (ref 19.0–186.0)
Retic Ct Pct: 1.9 % (ref 0.4–3.1)

## 2022-06-29 LAB — URINE CULTURE

## 2022-06-29 LAB — HEMOGLOBIN A1C
Hgb A1c MFr Bld: 6.2 % — ABNORMAL HIGH (ref 4.8–5.6)
Mean Plasma Glucose: 131.24 mg/dL

## 2022-06-29 LAB — PHOSPHORUS: Phosphorus: 3.6 mg/dL (ref 2.5–4.6)

## 2022-06-29 LAB — HEMOGLOBIN AND HEMATOCRIT, BLOOD
HCT: 28.2 % — ABNORMAL LOW (ref 36.0–46.0)
Hemoglobin: 8.7 g/dL — ABNORMAL LOW (ref 12.0–15.0)

## 2022-06-29 LAB — FERRITIN: Ferritin: 130 ng/mL (ref 11–307)

## 2022-06-29 NOTE — Progress Notes (Signed)
Per discussion with RN, Pt is too confused for CPAP @ this time. PRN order. No unit in room. No distress noted on RA. RT to follow.

## 2022-06-29 NOTE — Progress Notes (Signed)
PROGRESS NOTE  Diana [name]  DOB: [birth year]  PCP: Diana Marlin, DO WUJ:[id number]  DOA: 06/28/2022  LOS: 0 days  Hospital Day: 2  Brief narrative: Diana [name] is a 89 y.o. female with PMH significant for dementia, DM2, HTN, HLD, OSA on CPAP, CKD 4, chronic anemia,  hypothyroidism, recurrent UTIs on prophylactic antibiotics.   For the last 2 years, she has been living at [facility] independent living facility 4/20, patient was sent to the ED after she had 3 episodes of diarrhea and a speck of blood  Since January, patient has had several rounds of antimicrobials for infections including UTI, fungal infection as well as colitis.  Most recent course of antibiotics with 3 weeks ago for UTI.  She was recently started on prophylactic Keflex.  Frequent UTIs. 5 days ago, patient started to have abdominal cramping pain and diarrhea.  Initially stool was loose but became watery 3 days ago.  No fever or chills.  No nausea or vomiting but has poor oral intake.    In the ED, patient was afebrile, hemodynamically stable Labs with WC count normal, hemoglobin 9.1, platelet 251, potassium low at 3.1, BUN/creatinine 27/1.7 Urinalysis showed cloudy amber-colored urine with moderate leukocytes, many bacteria Stool assay with C. difficile antigen and toxin positive GI panel pending. CT abdomen pelvis showed acute uncomplicated diverticulitis of the descending and sigmoid colon, colonic diverticulosis. EDP discussed with infectious disease and was started on oral vancomycin Admitted to [facility].  Subjective: Patient was seen and examined this morning.  Pleasant elderly Caucasian female.  Lying down in bed.  Caregiver at bedside.  Patient is not in distress.  She is talking fluently but oriented to self only.  This is her baseline per caregiver. Chart reviewed Remains afebrile and hemodynamically stable  Assessment and plan: C. difficile colitis Acute uncomplicated diverticulitis of  descending and sigmoid colon Presented with abdominal pain, watery diarrhea in the setting of recent frequent use of antibiotics C. difficile antigen and toxin positive. CT abdomen with acute uncomplicated diverticulitis in the descending and sigmoid colon Currently on oral vancomycin only per ID recommendation. No fever.  WBC count normal. Continue to monitor thyroid frequency Recent Labs  Lab 06/28/22 1341 06/29/22 1015  WBC 5.8 4.2   Dehydration CKD 3B Dehydration secondary to nausea vomiting and poor oral intake Creatinine remains in baseline range. Currently on IV hydration.  ARB and Lasix on hold. Recent Labs    10/05/21 1126 10/12/21 1331 10/20/21 0812 11/16/21 0046 12/16/21 0855 03/15/22 1117 05/16/22 1908 05/16/22 1914 06/28/22 1341 06/29/22 1015  BUN 27* 27* 23 24* 29* 30* 28* 29* 27* 25*  CREATININE 1.51* 1.51* 1.50* 1.59* 1.52* 1.69* 1.74* 1.90* 1.70* 1.65*   Hypokalemia Potassium low at 3.1 on admission.  Replacement was given.   Recent Labs  Lab 06/28/22 1341 06/29/22 1015  K 3.1* 4.3  MG  --  2.0  PHOS  --  3.6   Essential hypertension PTA on Toprol 12.5 mg daily, amlodipine 5 mg daily, losartan 50 mg twice daily, Lasix 20 mg daily as needed Currently continued on Toprol. Others on hold. IV hydralazine as needed.  Continue to monitor blood pressure and resume accordingly.   Acute on chronic iron deficiency anemia Hemoglobin at baseline over 11.  Presented with a low hemoglobin 9.1.  There was mention of speck of blood in his stool.  Not an ideal candidate for endoscopic evaluation while going through infectious colitis.  No active bleeding.  Continue to  monitor hemoglobin.  Continue Protonix twice daily, iron supplement Recent Labs    03/15/22 1117 05/16/22 1908 05/16/22 1914 06/28/22 1341 06/29/22 1015  HGB 11.8* 11.4* 11.9* 9.1* 8.7*  8.7*  MCV 93.4 94.9  --  95.1 94.6  FERRITIN  --   --   --   --  130  TIBC  --   --   --   --  165*   IRON  --   --   --   --  20*  RETICCTPCT  --   --   --   --  1.9   Recurrent UTIs No active UTI at this time.  On prophylactic Keflex.  Currently on hold because of C. difficile colitis.  Type 2 diabetes mellitus A1c 8.4 on 08/26/2020 PTA on Lantus 18 units nightly Currently on sliding scale insulin with Accu-Cheks only. Recent Labs  Lab 06/28/22 2117 06/29/22 0739 06/29/22 1126 06/29/22 1340  GLUCAP 147* 121* 78 98   HLD Crestor 10 mg nightly  Hypothyroidism Continue Synthroid   OSA CPAP nightly  Anxiety/depression Prozac   Mobility: Encourage ambulation.  May need PT eval.  Goals of care   Code Status: Full Code     DVT prophylaxis:  SCDs Start: 06/28/22 1837   Antimicrobials: Oral vancomycin Fluid: On LR at 100 Consultants: ID called from ED Family Communication: Caregiver at bedside  Status: Inpatient Level of care:  Telemetry Medical   Needs to continue in-hospital care:  Ongoing diarrhea  Patient from: Independent living facility Anticipated d/c to: Pending clinical course, pending PT eval,   Diet:  Diet Order             Diet heart healthy/carb modified Room service appropriate? Yes; Fluid consistency: Thin  Diet effective now                   Scheduled Meds:  brimonidine  1 drop Both Eyes BID   And   timolol  1 drop Both Eyes BID   ferrous sulfate  325 mg Oral q AM   FLUoxetine  20 mg Oral q AM   insulin aspart  0-9 Units Subcutaneous TID WC   levothyroxine  75 mcg Oral q AM   metoprolol succinate  12.5 mg Oral q AM   pantoprazole  40 mg Oral BID AC   rosuvastatin  10 mg Oral QHS   vancomycin  125 mg Oral QID    PRN meds: hydrALAZINE, HYDROmorphone (DILAUDID) injection, ondansetron **OR** ondansetron (ZOFRAN) IV   Infusions:   lactated ringers      Antimicrobials: Anti-infectives (From admission, onward)    Start     Dose/Rate Route Frequency Ordered Stop   06/28/22 1815  vancomycin (VANCOCIN) capsule 125 mg         125 mg Oral 4 times daily 06/28/22 1813 07/08/22 1759       Nutritional status:  Body mass index is 25.01 kg/m.          Objective: Vitals:   06/29/22 0457 06/29/22 0742  BP: 118/80 (!) 162/147  Pulse: 78 70  Resp: 16 15  Temp: 98.2 F (36.8 C) 98.3 F (36.8 C)  SpO2: 100% 100%   No intake or output data in the 24 hours ending 06/29/22 1428 Filed Weights   06/28/22 1230 06/28/22 2046  Weight: 65.8 kg 74.6 kg   Weight change:  Body mass index is 25.01 kg/m.   Physical Exam: General exam: Pleasant, elderly Caucasian female. Skin: No rashes,  lesions or ulcers. HEENT: Atraumatic, normocephalic, no obvious bleeding Lungs: Clear to auscultation bilaterally CVS: Regular rate and rhythm, no murmur GI/Abd soft nontender, nondistended, bowel sounds in CNS: Alert, awake, oriented to self only at baseline Psychiatry: mood appropriate Extremities: No pedal edema, no calf tenderness  Data Review: I have personally reviewed the laboratory data and studies available.  F/u labs  Unresulted Labs (From admission, onward)     Start     Ordered   06/30/22 0500  CBC with Differential/Platelet  Tomorrow morning,   R       Question:  Specimen collection method  Answer:  Lab=Lab collect   06/29/22 1428   06/30/22 0500  Basic metabolic panel  Tomorrow morning,   R       Question:  Specimen collection method  Answer:  Lab=Lab collect   06/29/22 1428   06/28/22 1322  Gastrointestinal Panel by PCR , Stool  (Gastrointestinal Panel by PCR, Stool                                                                                                                                                     **Does Not include CLOSTRIDIUM DIFFICILE testing. **If CDIFF testing is needed, place order from the "C Difficile Testing" order set.**)  Once,   URGENT        06/28/22 1321            Total time spent in review of labs and imaging, patient evaluation, formulation of plan, documentation and  communication with family: 55 minutes  Signed, Diana Glass, MD [facility] 06/29/2022 .

## 2022-06-29 NOTE — Progress Notes (Signed)
Patient refused however not oriented enough to answer questions regarding cpap. Discussed with nurse at length and decided to ask family in the morning if she wears one at home. Patient is in no distress and resting comfortably on RA satting 100%.

## 2022-06-29 NOTE — Progress Notes (Signed)
Patient will need a central line put in in order to get labs due to BUE restrictions.

## 2022-06-29 NOTE — Evaluation (Addendum)
Physical Therapy Evaluation Patient Details Name: Diana [name] MRN: [id number] DOB: 03-09-1934 Today's Date: 06/29/2022  History of Present Illness  89 y/o F admitted to [facility] on 4/20 for for abdominal pain and diarrhea. CT abdomen of pelvis showed acute uncomplicated diverticulitis. PMHx: recurrent UTIs on prophylactic antibiotics, CKD stage IV, chronic iron deficiency anemia, HTN, IDDM, hypothyroidism, memory loss, OSA on CPAP  Clinical Impression  Pt presents today with impaired balance and generalized weakness, limited by cognition but with baseline deficits. Per pt's caregiver, she resides at independent living, with caregiver visiting every day, however pt without full time care as staff only check on pt intermittently when caregiver not there. Caregiver reports at baseline, pt ambulates brief distances to the bathroom with the RW but it depends on the day. Caregiver provides transportation but reports often feeling unsure if she will be able to get pt to the car with a recent decrease in strength and mobility. Pt able to perform bed mobility with close guard for balance, with intermittent posterior LOB while seated EOB. Attempted transfer and sit<>stand with RW but pt declining, which caregiver reports happens occasionally. Caregiver reports pt needs more care than is provided at her current setting. Acute PT will attempt to follow up with pt to try and progress mobility and strength and balance during admission, recommend subacute PT if pt is participatory with therapy services to progress mobility and decrease caregiver burden. If pt is limited by cognition and unable to participate, pt may benefit from long term care. Acute PT will continue to follow as appropriate.        Recommendations for follow up therapy are one component of a multi-disciplinary discharge planning process, led by the attending physician.  Recommendations may be updated based on patient status, additional functional  criteria and insurance authorization.  Follow Up Recommendations Can patient physically be transported by private vehicle: No     Assistance Recommended at Discharge Frequent or constant Supervision/Assistance  Patient can return home with the following  A little help with walking and/or transfers;Assist for transportation;Assistance with cooking/housework;A little help with bathing/dressing/bathroom    Equipment Recommendations None recommended by PT  Recommendations for Other Services       Functional Status Assessment Patient has had a recent decline in their functional status and/or demonstrates limited ability to make significant improvements in function in a reasonable and predictable amount of time     Precautions / Restrictions Precautions Precautions: Fall;Other (comment) Precaution Comments: enteric Restrictions Weight Bearing Restrictions: No      Mobility  Bed Mobility Overal bed mobility: Needs Assistance Bed Mobility: Supine to Sit, Sit to Supine     Supine to sit: Min guard, HOB elevated Sit to supine: Min guard, HOB elevated   General bed mobility comments: close guard for safety and balance with line mangement provided, increased cueing for pivoting to EOB    Transfers                   General transfer comment: attempted but pt declining    Ambulation/Gait               General Gait Details: pt declining  Stairs            Wheelchair Mobility    Modified Rankin (Stroke Patients Only)       Balance Overall balance assessment: Needs assistance Sitting-balance support: Bilateral upper extremity supported, Feet supported Sitting balance-Leahy Scale: Fair Sitting balance - Comments: close guard and intermittent minA  when pt reaching or lifting legs to prevent posterior LOB Postural control: Posterior lean                                   Pertinent Vitals/Pain Pain Assessment Pain Assessment: Faces Faces  Pain Scale: Hurts a little bit Pain Location: generalized Pain Descriptors / Indicators: Aching Pain Intervention(s): Limited activity within patient's tolerance, Monitored during session, Repositioned    Home Living Family/patient expects to be discharged to:: Private residence Living Arrangements: Alone Available Help at Discharge: Available PRN/intermittently;Personal care attendant Type of Home: Independent living facility Home Access: Level entry       Home Layout: One level Home Equipment: Agricultural consultant (2 wheels);BSC/3in1;Shower seat;Wheelchair - manual Additional Comments: pt from [facility] in independent living, has a caregiver who comes by everyday but is not available 24/7, reports staff check on pt intermittently when caregiver not there but no full time care. Asked about memory care but caregiver reporting she was not on that side    Prior Function Prior Level of Function : Needs assist             Mobility Comments: on good days, pt ambulates with RW to bathroom and brief distances, caregiver provides transportation       Hand Dominance   Dominant Hand: Right    Extremity/Trunk Assessment   Upper Extremity Assessment Upper Extremity Assessment: Generalized weakness    Lower Extremity Assessment Lower Extremity Assessment: Generalized weakness    Cervical / Trunk Assessment Cervical / Trunk Assessment: Kyphotic  Communication   Communication: No difficulties  Cognition Arousal/Alertness: Awake/alert Behavior During Therapy: Flat affect Overall Cognitive Status: History of cognitive impairments - at baseline                                 General Comments: history of dementia, pt oriented to self and her birth month and year, recognizes caregiver who reports that is her baseline recently        General Comments General comments (skin integrity, edema, etc.): caregiver entering at end of session and providing history and set-up     Exercises     Assessment/Plan    PT Assessment Patient needs continued PT services  PT Problem List Decreased strength;Decreased activity tolerance;Decreased balance;Decreased mobility;Decreased cognition;Decreased knowledge of use of DME;Decreased knowledge of precautions;Decreased safety awareness       PT Treatment Interventions DME instruction;Gait training;Functional mobility training;Therapeutic activities;Therapeutic exercise;Balance training;Neuromuscular re-education;Patient/family education    PT Goals (Current goals can be found in the Care Plan section)  Acute Rehab PT Goals Patient Stated Goal: get rest PT Goal Formulation: With family Time For Goal Achievement: 07/13/22 Potential to Achieve Goals: Fair    Frequency Min 2X/week     Co-evaluation               AM-PAC PT "6 Clicks" Mobility  Outcome Measure Help needed turning from your back to your side while in a flat bed without using bedrails?: A Little Help needed moving from lying on your back to sitting on the side of a flat bed without using bedrails?: A Little Help needed moving to and from a bed to a chair (including a wheelchair)?: A Lot Help needed standing up from a chair using your arms (e.g., wheelchair or bedside chair)?: A Lot Help needed to walk in hospital room?: Total Help  needed climbing 3-5 steps with a railing? : Total 6 Click Score: 12    End of Session   Activity Tolerance: Patient limited by fatigue Patient left: in bed;with call bell/phone within reach;with bed alarm set;with family/visitor present Nurse Communication: Mobility status PT Visit Diagnosis: Muscle weakness (generalized) (M62.81);Other abnormalities of gait and mobility (R26.89)    Time: 4098-1191 PT Time Calculation (min) (ACUTE ONLY): 21 min   Charges:   PT Evaluation $PT Eval Moderate Complexity: 1 Mod          Lindalou Hose, PT DPT Acute Rehabilitation Services Office [phone]   Leonie Man 06/29/2022, 1:17 PM

## 2022-06-29 NOTE — Progress Notes (Addendum)
Patient had a double masectomy done in 2021. Both BUE are restricted, patient cannot get any labs or peripheral IV put in. Patient has not received any IVF and has not had any labs done. Provider on call is aware.

## 2022-06-30 DIAGNOSIS — A0472 Enterocolitis due to Clostridium difficile, not specified as recurrent: Secondary | ICD-10-CM | POA: Diagnosis not present

## 2022-06-30 LAB — GLUCOSE, CAPILLARY
Glucose-Capillary: 116 mg/dL — ABNORMAL HIGH (ref 70–99)
Glucose-Capillary: 209 mg/dL — ABNORMAL HIGH (ref 70–99)
Glucose-Capillary: 92 mg/dL (ref 70–99)
Glucose-Capillary: 80 mg/dL (ref 70–99)
Glucose-Capillary: 155 mg/dL — ABNORMAL HIGH (ref 70–99)

## 2022-06-30 LAB — OCCULT BLOOD, POC DEVICE: Fecal Occult Bld: POSITIVE — AB

## 2022-06-30 LAB — CBC WITH DIFFERENTIAL/PLATELET
Abs Immature Granulocytes: 0.02 10*3/uL (ref 0.00–0.07)
Basophils Absolute: 0 10*3/uL (ref 0.0–0.1)
Basophils Relative: 1 %
Eosinophils Absolute: 0.3 10*3/uL (ref 0.0–0.5)
Eosinophils Relative: 7 %
HCT: 27.3 % — ABNORMAL LOW (ref 36.0–46.0)
Hemoglobin: 8.4 g/dL — ABNORMAL LOW (ref 12.0–15.0)
Immature Granulocytes: 0 %
Lymphocytes Relative: 21 %
Lymphs Abs: 0.9 10*3/uL (ref 0.7–4.0)
MCH: 29.4 pg (ref 26.0–34.0)
MCHC: 30.8 g/dL (ref 30.0–36.0)
MCV: 95.5 fL (ref 80.0–100.0)
Monocytes Absolute: 0.6 10*3/uL (ref 0.1–1.0)
Monocytes Relative: 13 %
Neutro Abs: 2.7 10*3/uL (ref 1.7–7.7)
Neutrophils Relative %: 58 %
Platelets: 243 10*3/uL (ref 150–400)
RBC: 2.86 MIL/uL — ABNORMAL LOW (ref 3.87–5.11)
RDW: 15.4 % (ref 11.5–15.5)
WBC: 4.6 10*3/uL (ref 4.0–10.5)
nRBC: 0 % (ref 0.0–0.2)

## 2022-06-30 LAB — BASIC METABOLIC PANEL
Anion gap: 6 (ref 5–15)
BUN: 30 mg/dL — ABNORMAL HIGH (ref 8–23)
CO2: 18 mmol/L — ABNORMAL LOW (ref 22–32)
Calcium: 8 mg/dL — ABNORMAL LOW (ref 8.9–10.3)
Chloride: 113 mmol/L — ABNORMAL HIGH (ref 98–111)
Creatinine, Ser: 1.99 mg/dL — ABNORMAL HIGH (ref 0.44–1.00)
GFR, Estimated: 24 mL/min — ABNORMAL LOW (ref >60)
Glucose, Bld: 146 mg/dL — ABNORMAL HIGH (ref 70–99)
Potassium: 4.5 mmol/L (ref 3.5–5.1)
Sodium: 137 mmol/L (ref 135–145)

## 2022-06-30 MED ORDER — INSULIN GLARGINE-YFGN 100 UNIT/ML ~~LOC~~ SOLN
10.0000 [IU] | Freq: Every day | SUBCUTANEOUS | Status: DC
  Administered 2022-06-30 – 2022-07-03 (×4): 10 [IU] via SUBCUTANEOUS
  Filled 2022-06-30 (×5): qty 0.1

## 2022-06-30 NOTE — Progress Notes (Signed)
PROGRESS NOTE  Diana [name]  DOB: 18-Jan-1934  PCP: Mattie Marlin, DO ZOX:[id number]  DOA: 06/28/2022  LOS: 1 day  Hospital Day: 3  Brief narrative: Diana [name] is a 89 y.o. female with PMH significant for dementia, DM2, HTN, HLD, OSA on CPAP, CKD 4, chronic anemia,  hypothyroidism, recurrent UTIs on prophylactic antibiotics.   For the last 2 years, she has been living at [facility] independent living facility 4/20, patient was sent to the ED after she had 3 episodes of diarrhea and a speck of blood  Since January, patient has had several rounds of antimicrobials for infections including UTI, fungal infection as well as colitis.  Most recent course of antibiotics with 3 weeks ago for UTI.  She was recently started on prophylactic Keflex.  Frequent UTIs. 5 days ago, patient started to have abdominal cramping pain and diarrhea.  Initially stool was loose but became watery 3 days ago.  No fever or chills.  No nausea or vomiting but has poor oral intake.    In the ED, patient was afebrile, hemodynamically stable Labs with WC count normal, hemoglobin 9.1, platelet 251, potassium low at 3.1, BUN/creatinine 27/1.7 Urinalysis showed cloudy amber-colored urine with moderate leukocytes, many bacteria Stool assay with C. difficile antigen and toxin positive GI panel pending. CT abdomen pelvis showed acute uncomplicated diverticulitis of the descending and sigmoid colon, colonic diverticulosis. EDP discussed with infectious disease and was started on oral vancomycin Admitted to [facility].  Subjective: Patient was seen and examined this morning. Pleasant elderly Caucasian female.  Mental status improving.  Slow to respond but able to have conversation.  Has dementia and currently at baseline.  Assessment and plan: C. difficile colitis Acute uncomplicated diverticulitis of descending and sigmoid colon Presented with abdominal pain, watery diarrhea in the setting of recent frequent  use of antibiotics C. difficile antigen and toxin positive. CT abdomen with acute uncomplicated diverticulitis in the descending and sigmoid colon EDP discussed with ID on admission.  Recommended 10-day course of oral vancomycin. No fever.  WBC count normal. Diarrhea has improved.  No BM in the last 24 hours. Recent Labs  Lab 06/28/22 1341 06/29/22 1015 06/30/22 0319  WBC 5.8 4.2 4.6    Dehydration CKD 3B Dehydration secondary to nausea vomiting and poor oral intake Creatinine remains in baseline range. Adequately hydrated.  Currently on IV fluid.  Encourage oral hydration.  ARB and Lasix on hold. Recent Labs    10/12/21 1331 10/20/21 0812 11/16/21 0046 12/16/21 0855 03/15/22 1117 05/16/22 1908 05/16/22 1914 06/28/22 1341 06/29/22 1015 06/30/22 0319  BUN 27* 23 24* 29* 30* 28* 29* 27* 25* 30*  CREATININE 1.51* 1.50* 1.59* 1.52* 1.69* 1.74* 1.90* 1.70* 1.65* 1.99*     Essential hypertension PTA on Toprol 12.5 mg daily, amlodipine 5 mg daily, losartan 50 mg twice daily, Lasix 20 mg daily as needed Currently continued on Toprol. Others on hold. IV hydralazine as needed.  Continue to monitor blood pressure and resume accordingly.   Acute on chronic iron deficiency anemia Hemoglobin at baseline over 11.  Presented with a low hemoglobin 9.1.  There was mention of speck of blood in his stool.  Not an ideal candidate for endoscopic evaluation while going through infectious colitis.  No active bleeding.  Continue to monitor hemoglobin.  Continue Protonix twice daily, iron supplement Recent Labs    05/16/22 1908 05/16/22 1914 06/28/22 1341 06/29/22 1015 06/30/22 0319  HGB 11.4* 11.9* 9.1* 8.7*  8.7* 8.4*  MCV  94.9  --  95.1 94.6 95.5  FERRITIN  --   --   --  130  --   TIBC  --   --   --  165*  --   IRON  --   --   --  20*  --   RETICCTPCT  --   --   --  1.9  --     Recurrent UTIs No active UTI at this time.  On prophylactic Keflex.  Currently on hold because of C.  difficile colitis.  Type 2 diabetes mellitus A1c 8.4 on 08/26/2020 PTA on Lantus 18 units nightly Currently on sliding scale insulin with Accu-Cheks only.  Blood sugar level running elevated.  Resume Lantus at 10 units today. Recent Labs  Lab 06/29/22 1340 06/29/22 1613 06/29/22 2040 06/30/22 0730 06/30/22 1208  GLUCAP 98 141* 142* 116* 209*    HLD Crestor 10 mg nightly  Hypothyroidism Continue Synthroid   OSA CPAP nightly  Anxiety/depression Prozac  Impaired mobility PT eval obtained.  SNF recommended.  Goals of care   Code Status: Full Code     DVT prophylaxis:  SCDs Start: 06/28/22 1837   Antimicrobials: Oral vancomycin Fluid: Not on IV fluid Consultants: None currently Family Communication: Caregiver at bedside  Status: Inpatient Level of care:  Telemetry Medical   Patient from: Independent living facility Anticipated d/c to: SNF recommended by PT.  Diarrhea improved.  Medically stable   Diet:  Diet Order             Diet heart healthy/carb modified Room service appropriate? Yes; Fluid consistency: Thin  Diet effective now                   Scheduled Meds:  brimonidine  1 drop Both Eyes BID   And   timolol  1 drop Both Eyes BID   ferrous sulfate  325 mg Oral q AM   FLUoxetine  20 mg Oral q AM   insulin aspart  0-9 Units Subcutaneous TID WC   levothyroxine  75 mcg Oral q AM   metoprolol succinate  12.5 mg Oral q AM   pantoprazole  40 mg Oral BID AC   rosuvastatin  10 mg Oral QHS   vancomycin  125 mg Oral QID    PRN meds: hydrALAZINE, HYDROmorphone (DILAUDID) injection, ondansetron **OR** ondansetron (ZOFRAN) IV   Infusions:   lactated ringers      Antimicrobials: Anti-infectives (From admission, onward)    Start     Dose/Rate Route Frequency Ordered Stop   06/28/22 1815  vancomycin (VANCOCIN) capsule 125 mg        125 mg Oral 4 times daily 06/28/22 1813 07/08/22 1759       Nutritional status:  Body mass index is 25.01  kg/m.          Objective: Vitals:   06/30/22 0507 06/30/22 0731  BP: 131/69 (!) 127/59  Pulse: (!) 56 (!) 52  Resp: 17 18  Temp: 97.8 F (36.6 C) 98.2 F (36.8 C)  SpO2: 99% 98%   No intake or output data in the 24 hours ending 06/30/22 1242 Filed Weights   06/28/22 1230 06/28/22 2046  Weight: 65.8 kg 74.6 kg   Weight change:  Body mass index is 25.01 kg/m.   Physical Exam: General exam: Pleasant, elderly Caucasian female.  Not in pain Skin: No rashes, lesions or ulcers. HEENT: Atraumatic, normocephalic, no obvious bleeding Lungs: Clear to auscultation bilaterally CVS: Regular rate and rhythm,  no murmur GI/Abd soft nontender, nondistended, bowel sounds in CNS: Alert, awake, oriented to place and person.  Able to have a conversation.  Demented at baseline. Psychiatry: mood appropriate Extremities: No pedal edema, no calf tenderness  Data Review: I have personally reviewed the laboratory data and studies available.  F/u labs  Unresulted Labs (From admission, onward)    None       Total time spent in review of labs and imaging, patient evaluation, formulation of plan, documentation and communication with family: 45 minutes  Signed, Lorin Glass, MD [facility] 06/30/2022 .

## 2022-06-30 NOTE — NC FL2 (Signed)
[place] MEDICAID FL2 LEVEL OF CARE FORM     IDENTIFICATION  Patient Name: Diana [name] Birthdate: [birth year] Sex: female Admission Date (Current Location): 06/28/2022  [facility] and [place] Number:  Producer, television/film/video and Address:  [facility], [address] [place], [place] [address]      Provider Number: [id number]  Attending Physician Name and Address:  Lorin Glass, MD  Relative Name and Phone Number:       Current Level of Care: Hospital Recommended Level of Care: Skilled Nursing Facility Prior Approval Number:    Date Approved/Denied:   PASRR Number: [id number]  Discharge Plan: SNF    Current Diagnoses: Patient Active Problem List   Diagnosis Date Noted   C. difficile colitis 06/28/2022   Iron deficiency anemia 06/28/2022   Hypokalemia 06/28/2022   Colitis due to Clostridium difficile 06/28/2022   Permanent atrial fibrillation 05/14/2021   Intracranial bleed 08/28/2020   Intracranial hemorrhage 08/25/2020   Generalized weakness 06/30/2020   CKD (chronic kidney disease) stage 3, GFR 30-59 ml/min 06/30/2020   Dyspnea 06/30/2020   Influenza A 06/30/2020   Closed dislocation of left elbow 03/19/2020   Depression, recurrent 01/21/2019   Hyperlipidemia associated with type 2 diabetes mellitus 01/21/2019   Acute metabolic encephalopathy 01/11/2019   Fall 01/11/2019   Klebsiella UTI (urinary tract infection) 01/11/2019   Acute urinary retention 01/11/2019   Left kidney lesion 01/11/2019   Ulcerative esophagitis 01/11/2019   Persistent atrial fibrillation    Anemia 01/01/2019   Hiatal hernia    Symptomatic anemia 12/31/2018   Hypoglycemia 12/31/2018   Ribs, multiple fractures 12/31/2018   Primary hypertension 05/15/2013   OSA on CPAP 05/15/2013   Hypothyroidism 05/15/2013   Secondary DM with CKD stage 3 and hypertension 05/15/2013   Lower extremity edema 05/15/2013   Renal insufficiency 05/15/2013   Morbid obesity  05/15/2013   Breast cancer (HCC) 03/18/2011    Orientation RESPIRATION BLADDER Height & Weight     Self  Normal Incontinent Weight: 164 lb 7.4 oz (74.6 kg) (bed reset and empty of extra materials before wt taken) Height:   (172.7 cm)  BEHAVIORAL SYMPTOMS/MOOD NEUROLOGICAL BOWEL NUTRITION STATUS      Incontinent Diet (see discharge summary)  AMBULATORY STATUS COMMUNICATION OF NEEDS Skin   Limited Assist (declining) Verbally Normal                       Personal Care Assistance Level of Assistance  Bathing, Feeding, Dressing Bathing Assistance: Limited assistance Feeding assistance: Independent Dressing Assistance: Limited assistance     Functional Limitations Info  Sight, Hearing, Speech Sight Info: Impaired Hearing Info: Adequate Speech Info: Adequate    SPECIAL CARE FACTORS FREQUENCY  PT (By licensed PT), OT (By licensed OT)     PT Frequency: 5x/week OT Frequency: 5x/week            Contractures Contractures Info: Not present    Additional Factors Info  Code Status, Allergies, Isolation Precautions, Insulin Sliding Scale Code Status Info: FULL Allergies Info: Codeine Sulfate  Sps (Sodium Polystyrene Sulfonate)  Sulfa Antibiotics   Insulin Sliding Scale Info: see discharge summary Isolation Precautions Info: Enteric precautions (UV disinfection)     Current Medications (06/30/2022):  This is the current hospital active medication list Current Facility-Administered Medications  Medication Dose Route Frequency Provider Last Rate Last Admin   brimonidine (ALPHAGAN) 0.2 % ophthalmic solution 1 drop  1 drop Both Eyes BID Mikey College  T, MD   1 drop at 06/30/22 1049   And   timolol (TIMOPTIC) 0.5 % ophthalmic solution 1 drop  1 drop Both Eyes BID Mikey College T, MD   1 drop at 06/30/22 1049   ferrous sulfate tablet 325 mg  325 mg Oral q AM Emeline General, MD   325 mg at 06/30/22 0834   FLUoxetine (PROZAC) capsule 20 mg  20 mg Oral q AM Mikey College T, MD   20  mg at 06/30/22 0834   hydrALAZINE (APRESOLINE) injection 5 mg  5 mg Intravenous Q6H PRN Mikey College T, MD       HYDROmorphone (DILAUDID) injection 0.5-1 mg  0.5-1 mg Intravenous Q2H PRN Mikey College T, MD       insulin aspart (novoLOG) injection 0-9 Units  0-9 Units Subcutaneous TID WC Emeline General, MD   3 Units at 06/30/22 1236   insulin glargine-yfgn (SEMGLEE) injection 10 Units  10 Units Subcutaneous Daily Dahal, Melina Schools, MD   10 Units at 06/30/22 1354   lactated ringers infusion   Intravenous Continuous Kommor, Madison, MD       levothyroxine (SYNTHROID) tablet 75 mcg  75 mcg Oral q AM Emeline General, MD   75 mcg at 06/30/22 0522   metoprolol succinate (TOPROL-XL) 24 hr tablet 12.5 mg  12.5 mg Oral q AM Mikey College T, MD   12.5 mg at 06/30/22 0833   ondansetron (ZOFRAN) tablet 4 mg  4 mg Oral Q6H PRN Mikey College T, MD       Or   ondansetron [facility]) injection 4 mg  4 mg Intravenous Q6H PRN Mikey College T, MD       pantoprazole (PROTONIX) EC tablet 40 mg  40 mg Oral BID AC Mikey College T, MD   40 mg at 06/30/22 0981   rosuvastatin (CRESTOR) tablet 10 mg  10 mg Oral QHS Mikey College T, MD   10 mg at 06/29/22 2108   vancomycin (VANCOCIN) capsule 125 mg  125 mg Oral QID Kommor, Wyn Forster, MD   125 mg at 06/30/22 1353     Discharge Medications: Please see discharge summary for a list of discharge medications.  Relevant Imaging Results:  Relevant Lab Results:   Additional Information SSN:[id number]  Diana [name] A [place], [place]

## 2022-07-01 DIAGNOSIS — A0472 Enterocolitis due to Clostridium difficile, not specified as recurrent: Secondary | ICD-10-CM | POA: Diagnosis not present

## 2022-07-01 LAB — GLUCOSE, CAPILLARY
Glucose-Capillary: 84 mg/dL (ref 70–99)
Glucose-Capillary: 120 mg/dL — ABNORMAL HIGH (ref 70–99)
Glucose-Capillary: 135 mg/dL — ABNORMAL HIGH (ref 70–99)
Glucose-Capillary: 129 mg/dL — ABNORMAL HIGH (ref 70–99)

## 2022-07-01 LAB — URINE CULTURE: Culture: >=100000 — AB

## 2022-07-01 NOTE — Progress Notes (Signed)
PROGRESS NOTE  Diana [name]  DOB: 02-06-1934  PCP: Mattie Marlin, DO ZOX:[id number]  DOA: 06/28/2022  LOS: 2 days  Hospital Day: 4  Brief narrative: Diana [name] is a 89 y.o. female with PMH significant for dementia, DM2, HTN, HLD, OSA on CPAP, CKD 4, chronic anemia,  hypothyroidism, recurrent UTIs on prophylactic antibiotics.   For the last 2 years, she has been living at [facility] independent living facility 4/20, patient was sent to the ED after she had 3 episodes of diarrhea and a speck of blood  Since January, patient has had several rounds of antimicrobials for infections including UTI, fungal infection as well as colitis.  Most recent course of antibiotics with 3 weeks ago for UTI.  She was recently started on prophylactic Keflex.  Frequent UTIs. 5 days ago, patient started to have abdominal cramping pain and diarrhea.  Initially stool was loose but became watery 3 days ago.  No fever or chills.  No nausea or vomiting but has poor oral intake.    In the ED, patient was afebrile, hemodynamically stable Labs with WC count normal, hemoglobin 9.1, platelet 251, potassium low at 3.1, BUN/creatinine 27/1.7 Urinalysis showed cloudy amber-colored urine with moderate leukocytes, many bacteria Stool assay with C. difficile antigen and toxin positive GI panel pending. CT abdomen pelvis showed acute uncomplicated diverticulitis of the descending and sigmoid colon, colonic diverticulosis. EDP discussed with infectious disease and was started on oral vancomycin Admitted to [facility].  Subjective: Patient was seen and examined this morning. Lying in bed.  Not in distress.  No new symptoms.  Caregiver at bedside. Medically stable.  Pending SNF  Assessment and plan: C. difficile colitis Acute uncomplicated diverticulitis of descending and sigmoid colon Presented with abdominal pain, watery diarrhea in the setting of recent frequent use of antibiotics C. difficile antigen and  toxin positive. CT abdomen with acute uncomplicated diverticulitis in the descending and sigmoid colon EDP discussed with ID on admission.  Recommended 10-day course of oral vancomycin. No fever.  WBC count normal. Diarrhea has improved.  No BM in the last 24 hours.  Watch for progression to constipation Recent Labs  Lab 06/28/22 1341 06/29/22 1015 06/30/22 0319  WBC 5.8 4.2 4.6    Dehydration CKD 3B Dehydration secondary to nausea vomiting and poor oral intake Creatinine remains in baseline range. Adequately hydrated.  Currently on IV fluid.  Encourage oral hydration.  ARB and Lasix on hold. Recent Labs    10/12/21 1331 10/20/21 0812 11/16/21 0046 12/16/21 0855 03/15/22 1117 05/16/22 1908 05/16/22 1914 06/28/22 1341 06/29/22 1015 06/30/22 0319  BUN 27* 23 24* 29* 30* 28* 29* 27* 25* 30*  CREATININE 1.51* 1.50* 1.59* 1.52* 1.69* 1.74* 1.90* 1.70* 1.65* 1.99*    Essential hypertension PTA on Toprol 12.5 mg daily, amlodipine 5 mg daily, losartan 50 mg twice daily, Lasix 20 mg daily as needed Currently continued on Toprol. Others on hold. IV hydralazine as needed.  Continue to monitor blood pressure and resume accordingly.   Acute on chronic iron deficiency anemia Hemoglobin at baseline over 11.  Presented with a low hemoglobin 9.1.  There was mention of speck of blood in his stool.  Not an ideal candidate for endoscopic evaluation while going through infectious colitis.  No active bleeding.  Continue to monitor hemoglobin.  Continue Protonix twice daily, iron supplement Recent Labs    05/16/22 1908 05/16/22 1914 06/28/22 1341 06/29/22 1015 06/30/22 0319  HGB 11.4* 11.9* 9.1* 8.7*  8.7* 8.4*  MCV 94.9  --  95.1 94.6 95.5  FERRITIN  --   --   --  130  --   TIBC  --   --   --  165*  --   IRON  --   --   --  20*  --   RETICCTPCT  --   --   --  1.9  --     Recurrent UTIs No active UTI at this time.  On prophylactic Keflex.  Currently on hold because of C.  difficile colitis.  Type 2 diabetes mellitus A1c 8.4 on 08/26/2020 PTA on Lantus 18 units nightly Currently on Semglee 10 units daily along with sliding scale insulin with Accu-Cheks only.  Recent Labs  Lab 06/30/22 1540 06/30/22 1714 06/30/22 2056 07/01/22 0726 07/01/22 1206  GLUCAP 92 80 155* 84 120*    HLD Crestor 10 mg nightly  Hypothyroidism Continue Synthroid   OSA CPAP nightly  Anxiety/depression Prozac  Impaired mobility PT eval obtained.  SNF recommended.  Goals of care   Code Status: Full Code     DVT prophylaxis:  SCDs Start: 06/28/22 1837   Antimicrobials: Oral vancomycin Fluid: Not on IV fluid Consultants: None currently Family Communication: Caregiver at bedside  Status: Inpatient Level of care:  Telemetry Medical   Patient from: Independent living facility Anticipated d/c to: SNF recommended by PT.  Diarrhea improved.  Medically stable pending SNF.   Diet:  Diet Order             Diet heart healthy/carb modified Room service appropriate? Yes; Fluid consistency: Thin  Diet effective now                   Scheduled Meds:  brimonidine  1 drop Both Eyes BID   And   timolol  1 drop Both Eyes BID   ferrous sulfate  325 mg Oral q AM   insulin aspart  0-9 Units Subcutaneous TID WC   insulin glargine-yfgn  10 Units Subcutaneous Daily   levothyroxine  75 mcg Oral q AM   metoprolol succinate  12.5 mg Oral q AM   pantoprazole  40 mg Oral BID AC   rosuvastatin  10 mg Oral QHS   vancomycin  125 mg Oral QID    PRN meds: hydrALAZINE, HYDROmorphone (DILAUDID) injection, ondansetron **OR** ondansetron (ZOFRAN) IV   Infusions:   lactated ringers      Antimicrobials: Anti-infectives (From admission, onward)    Start     Dose/Rate Route Frequency Ordered Stop   06/28/22 1815  vancomycin (VANCOCIN) capsule 125 mg        125 mg Oral 4 times daily 06/28/22 1813 07/08/22 1759       Nutritional status:  Body mass index is 25.01  kg/m.          Objective: Vitals:   07/01/22 0445 07/01/22 0724  BP: 117/66 (!) 155/56  Pulse: (!) 57 (!) 58  Resp: 16   Temp: 97.6 F (36.4 C) 97.7 F (36.5 C)  SpO2: 99% 100%    Intake/Output Summary (Last 24 hours) at 07/01/2022 1236 Last data filed at 06/30/2022 2200 Gross per 24 hour  Intake 120 ml  Output --  Net 120 ml   Filed Weights   06/28/22 1230 06/28/22 2046  Weight: 65.8 kg 74.6 kg   Weight change:  Body mass index is 25.01 kg/m.   Physical Exam: General exam: Pleasant, elderly Caucasian female.  Not in pain Skin: No rashes, lesions or  ulcers. HEENT: Atraumatic, normocephalic, no obvious bleeding Lungs: Clear to auscultation bilaterally CVS: Regular rate and rhythm, no murmur GI/Abd soft nontender, nondistended, bowel sounds in CNS: Open eyes and follow commands, oriented to place and person.  Able to have a conversation.  Demented at baseline. Psychiatry: mood appropriate Extremities: No pedal edema, no calf tenderness  Data Review: I have personally reviewed the laboratory data and studies available.  F/u labs  Unresulted Labs (From admission, onward)     Start     Ordered   07/02/22 0500  Basic metabolic panel  Tomorrow morning,   R       Question:  Specimen collection method  Answer:  Lab=Lab collect   07/01/22 0753   07/02/22 0500  CBC with Differential/Platelet  Tomorrow morning,   R       Question:  Specimen collection method  Answer:  Lab=Lab collect   07/01/22 0753            Total time spent in review of labs and imaging, patient evaluation, formulation of plan, documentation and communication with family: 35 minutes  Signed, Lorin Glass, MD [facility] 07/01/2022 .

## 2022-07-01 NOTE — TOC Initial Note (Signed)
Transition of Care [facility]) - Initial/Assessment Note    Patient Details  Name: Diana [name] MRN: [id number] Date of Birth: [birth year]  Transition of Care [facility]) CM/SW Contact:    Janae Bridgeman, RN Phone Number: 07/01/2022, 2:07 PM  Clinical Narrative:                 Cm met with the patient at the bedside and no family present in the room at this time.  The patient was awake and pleasantly confused and unable to make decisions regarding SNF placement.  I spoke with Kiva, MSW with TOC and she will follow up with the POA regarding SNF placement.  The patient was living at [facility] with caregiver available but not at this time at the bedside.  Expected Discharge Plan: Skilled Nursing Facility Barriers to Discharge: Continued Medical Work up   Patient Goals and CMS Choice Patient states their goals for this hospitalization and ongoing recovery are:: to go home - (patient has dementia) CMS Medicare.gov Compare Post Acute Care list provided to:: Patient Choice offered to / list presented to : Patient      Expected Discharge Plan and Services                                              Prior Living Arrangements/Services                       Activities of Daily Living Home Assistive Devices/Equipment: Environmental consultant (specify type), Wheelchair, Hospital bed ADL Screening (condition at time of admission) Patient's cognitive ability adequate to safely complete daily activities?: Yes Is the patient deaf or have difficulty hearing?: No Does the patient have difficulty seeing, even when wearing glasses/contacts?: No Does the patient have difficulty concentrating, remembering, or making decisions?: Yes Patient able to express need for assistance with ADLs?: Yes Does the patient have difficulty dressing or bathing?: Yes Independently performs ADLs?: No Communication: Independent Dressing (OT): Needs assistance Is this a change from baseline?:  Pre-admission baseline Grooming: Needs assistance Is this a change from baseline?: Pre-admission baseline Feeding: Independent Bathing: Needs assistance Is this a change from baseline?: Pre-admission baseline Toileting: Needs assistance Is this a change from baseline?: Pre-admission baseline In/Out Bed: Needs assistance Is this a change from baseline?: Pre-admission baseline Walks in Home: Needs assistance Is this a change from baseline?: Pre-admission baseline Does the patient have difficulty walking or climbing stairs?: Yes Weakness of Legs: Both Weakness of Arms/Hands: Both (dislocated L elbow)  Permission Sought/Granted                  Emotional Assessment              Admission diagnosis:  Diverticulitis [K57.92] Colitis due to Clostridium difficile [A04.72] C. difficile colitis [A04.72] Patient Active Problem List   Diagnosis Date Noted   C. difficile colitis 06/28/2022   Iron deficiency anemia 06/28/2022   Hypokalemia 06/28/2022   Colitis due to Clostridium difficile 06/28/2022   Permanent atrial fibrillation 05/14/2021   Intracranial bleed 08/28/2020   Intracranial hemorrhage 08/25/2020   Generalized weakness 06/30/2020   CKD (chronic kidney disease) stage 3, GFR 30-59 ml/min 06/30/2020   Dyspnea 06/30/2020   Influenza A 06/30/2020   Closed dislocation of left elbow 03/19/2020   Depression, recurrent 01/21/2019   Hyperlipidemia associated with type 2 diabetes mellitus 01/21/2019  Acute metabolic encephalopathy 01/11/2019   Fall 01/11/2019   Klebsiella UTI (urinary tract infection) 01/11/2019   Acute urinary retention 01/11/2019   Left kidney lesion 01/11/2019   Ulcerative esophagitis 01/11/2019   Persistent atrial fibrillation    Anemia 01/01/2019   Hiatal hernia    Symptomatic anemia 12/31/2018   Hypoglycemia 12/31/2018   Ribs, multiple fractures 12/31/2018   Primary hypertension 05/15/2013   OSA on CPAP 05/15/2013   Hypothyroidism  05/15/2013   Secondary DM with CKD stage 3 and hypertension 05/15/2013   Lower extremity edema 05/15/2013   Renal insufficiency 05/15/2013   Morbid obesity 05/15/2013   Breast cancer (HCC) 03/18/2011   PCP:  Mattie Marlin, DO Pharmacy:   [facility] DR[facility] STORE [phone] Ginette Otto, [place] - [address] AT [facility] [address] [place] [address] Phone: [phone] Fax: [phone]  [id number] - [place], [place] - [address] AT [facility] [address] [place] [address] Phone: [phone] Fax: [phone]  [facility] - [place], [place] - [address] [place] [address] Phone: [phone] Fax: [phone]     Social Determinants of Health (SDOH) Social History: SDOH Screenings   Tobacco Use: Medium Risk (06/28/2022)   SDOH Interventions:     Readmission Risk Interventions    07/06/2020   12:17 PM  Readmission Risk Prevention Plan  Transportation Screening Complete  PCP or Specialist Appt within 5-7 Days Complete  Home Care Screening Complete  Medication Review (RN CM) Complete

## 2022-07-01 NOTE — TOC Initial Note (Signed)
Transition of Care [facility]) - Initial/Assessment Note    Patient Details  Name: Diana [name] MRN: [id number] Date of Birth: March 31, 1933  Transition of Care [facility]) CM/SW Contact:    Gregoire Bennis A [place], Theresia Majors Phone Number: 07/01/2022, 5:05 PM  Clinical Narrative:                 TOC had phone conversation with pt's POA Ann Callicott. She stated that she is agreeable with provider recommendation for SNF. She said pt has gone to [facility] in the past and has preference for their facility as well as [facility] or [facility]. SNF workup completed.   TOC will continue to follow.   Expected Discharge Plan: Skilled Nursing Facility Barriers to Discharge: SNF Pending bed offer   Patient Goals and CMS Choice Patient states their goals for this hospitalization and ongoing recovery are:: Pt's POA stated pt should receive care at [facility] CMS Medicare.gov Compare Post Acute Care list provided to:: Patient Choice offered to / list presented to : Patient      Expected Discharge Plan and Services       Living arrangements for the past 2 months: Independent Living Facility ([facility])                                      Prior Living Arrangements/Services Living arrangements for the past 2 months: Independent Living Facility ([facility]) Lives with:: Self, Other (Comment) (pt has caregiver)          Need for Family Participation in Patient Care: No (Comment) Care giver support system in place?: Yes (comment)      Activities of Daily Living Home Assistive Devices/Equipment: Environmental consultant (specify type), Wheelchair, Hospital bed ADL Screening (condition at time of admission) Patient's cognitive ability adequate to safely complete daily activities?: Yes Is the patient deaf or have difficulty hearing?: No Does the patient have difficulty seeing, even when wearing glasses/contacts?: No Does the patient have difficulty concentrating, remembering, or making decisions?: Yes Patient  able to express need for assistance with ADLs?: Yes Does the patient have difficulty dressing or bathing?: Yes Independently performs ADLs?: No Communication: Independent Dressing (OT): Needs assistance Is this a change from baseline?: Pre-admission baseline Grooming: Needs assistance Is this a change from baseline?: Pre-admission baseline Feeding: Independent Bathing: Needs assistance Is this a change from baseline?: Pre-admission baseline Toileting: Needs assistance Is this a change from baseline?: Pre-admission baseline In/Out Bed: Needs assistance Is this a change from baseline?: Pre-admission baseline Walks in Home: Needs assistance Is this a change from baseline?: Pre-admission baseline Does the patient have difficulty walking or climbing stairs?: Yes Weakness of Legs: Both Weakness of Arms/Hands: Both (dislocated L elbow)  Permission Sought/Granted                  Emotional Assessment Appearance:: Appears stated age Attitude/Demeanor/Rapport: Lethargic Affect (typically observed): Quiet Orientation: : Oriented to Self Alcohol / Substance Use: Not Applicable Psych Involvement: No (comment)  Admission diagnosis:  Diverticulitis [K57.92] Colitis due to Clostridium difficile [A04.72] C. difficile colitis [A04.72] Patient Active Problem List   Diagnosis Date Noted   C. difficile colitis 06/28/2022   Iron deficiency anemia 06/28/2022   Hypokalemia 06/28/2022   Colitis due to Clostridium difficile 06/28/2022   Permanent atrial fibrillation 05/14/2021   Intracranial bleed 08/28/2020   Intracranial hemorrhage 08/25/2020   Generalized weakness 06/30/2020   CKD (chronic kidney disease) stage  3, GFR 30-59 ml/min 06/30/2020   Dyspnea 06/30/2020   Influenza A 06/30/2020   Closed dislocation of left elbow 03/19/2020   Depression, recurrent 01/21/2019   Hyperlipidemia associated with type 2 diabetes mellitus 01/21/2019   Acute metabolic encephalopathy 01/11/2019    Fall 01/11/2019   Klebsiella UTI (urinary tract infection) 01/11/2019   Acute urinary retention 01/11/2019   Left kidney lesion 01/11/2019   Ulcerative esophagitis 01/11/2019   Persistent atrial fibrillation    Anemia 01/01/2019   Hiatal hernia    Symptomatic anemia 12/31/2018   Hypoglycemia 12/31/2018   Ribs, multiple fractures 12/31/2018   Primary hypertension 05/15/2013   OSA on CPAP 05/15/2013   Hypothyroidism 05/15/2013   Secondary DM with CKD stage 3 and hypertension 05/15/2013   Lower extremity edema 05/15/2013   Renal insufficiency 05/15/2013   Morbid obesity 05/15/2013   Breast cancer (HCC) 03/18/2011   PCP:  Mattie Marlin, DO Pharmacy:   [facility]ORE [phone] Ginette Otto, [place] - [address] AT [facility] OF [address] [place] [address] Phone: [phone] Fax: [phone]  [id number] - [place], [place] - [address] AT [facility] [address] [place] [address] Phone: [phone] Fax: [phone]  [facility] - [place], [place] - [address] [place] [address] Phone: [phone] Fax: [phone]     Social Determinants of Health (SDOH) Social History: SDOH Screenings   Tobacco Use: Medium Risk (06/28/2022)   SDOH Interventions:     Readmission Risk Interventions    07/06/2020   12:17 PM  Readmission Risk Prevention Plan  Transportation Screening Complete  PCP or Specialist Appt within 5-7 Days Complete  Home Care Screening Complete  Medication Review (RN CM) Complete

## 2022-07-02 DIAGNOSIS — A0472 Enterocolitis due to Clostridium difficile, not specified as recurrent: Secondary | ICD-10-CM | POA: Diagnosis not present

## 2022-07-02 LAB — CBC WITH DIFFERENTIAL/PLATELET
Abs Immature Granulocytes: 0.01 10*3/uL (ref 0.00–0.07)
Basophils Absolute: 0.1 10*3/uL (ref 0.0–0.1)
Basophils Relative: 1 %
Eosinophils Absolute: 0.4 10*3/uL (ref 0.0–0.5)
Eosinophils Relative: 6 %
HCT: 31.7 % — ABNORMAL LOW (ref 36.0–46.0)
Hemoglobin: 9.9 g/dL — ABNORMAL LOW (ref 12.0–15.0)
Immature Granulocytes: 0 %
Lymphocytes Relative: 10 %
Lymphs Abs: 0.7 10*3/uL (ref 0.7–4.0)
MCH: 29 pg (ref 26.0–34.0)
MCHC: 31.2 g/dL (ref 30.0–36.0)
MCV: 93 fL (ref 80.0–100.0)
Monocytes Absolute: 0.7 10*3/uL (ref 0.1–1.0)
Monocytes Relative: 11 %
Neutro Abs: 4.8 10*3/uL (ref 1.7–7.7)
Neutrophils Relative %: 72 %
Platelets: 285 10*3/uL (ref 150–400)
RBC: 3.41 MIL/uL — ABNORMAL LOW (ref 3.87–5.11)
RDW: 15 % (ref 11.5–15.5)
WBC: 6.7 10*3/uL (ref 4.0–10.5)
nRBC: 0 % (ref 0.0–0.2)

## 2022-07-02 LAB — BASIC METABOLIC PANEL
Anion gap: 6 (ref 5–15)
BUN: 32 mg/dL — ABNORMAL HIGH (ref 8–23)
CO2: 21 mmol/L — ABNORMAL LOW (ref 22–32)
Calcium: 9.1 mg/dL (ref 8.9–10.3)
Chloride: 110 mmol/L (ref 98–111)
Creatinine, Ser: 1.93 mg/dL — ABNORMAL HIGH (ref 0.44–1.00)
GFR, Estimated: 25 mL/min — ABNORMAL LOW (ref >60)
Glucose, Bld: 152 mg/dL — ABNORMAL HIGH (ref 70–99)
Potassium: 4.9 mmol/L (ref 3.5–5.1)
Sodium: 137 mmol/L (ref 135–145)

## 2022-07-02 LAB — GLUCOSE, CAPILLARY
Glucose-Capillary: 133 mg/dL — ABNORMAL HIGH (ref 70–99)
Glucose-Capillary: 142 mg/dL — ABNORMAL HIGH (ref 70–99)
Glucose-Capillary: 181 mg/dL — ABNORMAL HIGH (ref 70–99)
Glucose-Capillary: 127 mg/dL — ABNORMAL HIGH (ref 70–99)

## 2022-07-02 MED ORDER — AMLODIPINE BESYLATE 5 MG PO TABS
5.0000 mg | ORAL_TABLET | Freq: Every day | ORAL | Status: DC
  Administered 2022-07-03: 5 mg via ORAL
  Filled 2022-07-02: qty 1

## 2022-07-02 MED ORDER — LOSARTAN POTASSIUM 50 MG PO TABS
50.0000 mg | ORAL_TABLET | Freq: Two times a day (BID) | ORAL | Status: DC
  Administered 2022-07-02 – 2022-07-03 (×4): 50 mg via ORAL
  Filled 2022-07-02 (×4): qty 1

## 2022-07-02 NOTE — Progress Notes (Signed)
Physical Therapy Treatment Patient Details Name: Diana [name] MRN: [name] Today's Date: 07/02/2022   History of Present Illness 89 y/o F admitted to [facility] on 4/20 for for abdominal pain and diarrhea. CT abdomen of pelvis showed acute uncomplicated diverticulitis. PMHx: recurrent UTIs on prophylactic antibiotics, CKD stage IV, chronic iron deficiency anemia, HTN, IDDM, hypothyroidism, memory loss, OSA on CPAP    PT Comments    Pt pleasant and agreeable to therapy. Pt received soiled in stool and urine, pt unaware. Pt minA for transfer to EOB and modA for step pvt to chair with RW. Encourage pt to ambulate further however declined. Pt dependent for hygiene. Pt requires 24/7 assist as pt at increase fall risk and has impaired cognition and is unable to care for self properly or safely. Aware pt was was in ALF however at this time pt requiring increased level of care and would benefit from long term care unless ALF can provide 24/7 assist. Acute PT to cont to follow.    Recommendations for follow up therapy are one component of a multi-disciplinary discharge planning process, led by the attending physician.  Recommendations may be updated based on patient status, additional functional criteria and insurance authorization.  Follow Up Recommendations  Can patient physically be transported by private vehicle: Yes    Assistance Recommended at Discharge Frequent or constant Supervision/Assistance  Patient can return home with the following A little help with walking and/or transfers;Assist for transportation;Assistance with cooking/housework;A little help with bathing/dressing/bathroom   Equipment Recommendations  None recommended by PT    Recommendations for Other Services       Precautions / Restrictions Precautions Precautions: Fall;Other (comment) Precaution Comments: enteric Restrictions Weight Bearing Restrictions: No     Mobility  Bed Mobility Overal bed  mobility: Needs Assistance Bed Mobility: Supine to Sit     Supine to sit: HOB elevated, Min assist     General bed mobility comments: max directional verbal cues, minA for LE management over soiled linens, pt used bed rail, increased time    Transfers Overall transfer level: Needs assistance Equipment used: Rolling walker (2 wheels) Transfers: Sit to/from Stand, Bed to chair/wheelchair/BSC Sit to Stand: Min assist   Step pivot transfers: Min assist       General transfer comment: minA to power up, verbal cues for safe hand placement, minA for walker management during step pvt to chair, had pt attempt sit to stand from chair x2, pt required incresaed assist stating "oh i just can't do anymore"    Ambulation/Gait               General Gait Details: limited to step pvt to chair   Stairs             Wheelchair Mobility    Modified Rankin (Stroke Patients Only)       Balance Overall balance assessment: Needs assistance Sitting-balance support: Feet supported, No upper extremity supported Sitting balance-Leahy Scale: Good     Standing balance support: During functional activity, Reliant on assistive device for balance, Bilateral upper extremity supported Standing balance-Leahy Scale: Poor Standing balance comment: pt stood x 1 min for hygiene s/p urinary and stool incontinence, pt dependent for hygiene                            Cognition Arousal/Alertness: Awake/alert Behavior During Therapy: WFL for tasks assessed/performed Overall Cognitive Status: History of cognitive impairments - at baseline  General Comments: h/o dementia, oriented to self and place however then later in session pt asked where this place was and what bed she slept in last night, pt unaware she was soiled and that she had urinated and had BM        Exercises      General Comments General comments (skin integrity, edema,  etc.): pt soiled upon entering, pt dependent for hygiene and unaware she was wet      Pertinent Vitals/Pain Pain Assessment Pain Assessment: Faces Faces Pain Scale: No hurt    Home Living                          Prior Function            PT Goals (current goals can now be found in the care plan section) Progress towards PT goals: Progressing toward goals    Frequency    Min 2X/week      PT Plan Current plan remains appropriate    Co-evaluation              AM-PAC PT "6 Clicks" Mobility   Outcome Measure  Help needed turning from your back to your side while in a flat bed without using bedrails?: A Little Help needed moving from lying on your back to sitting on the side of a flat bed without using bedrails?: A Little Help needed moving to and from a bed to a chair (including a wheelchair)?: A Lot Help needed standing up from a chair using your arms (e.g., wheelchair or bedside chair)?: A Lot Help needed to walk in hospital room?: A Lot Help needed climbing 3-5 steps with a railing? : A Lot 6 Click Score: 14    End of Session Equipment Utilized During Treatment: Gait belt Activity Tolerance: Patient tolerated treatment well Patient left: in chair;with chair alarm set;with call bell/phone within reach Nurse Communication: Mobility status (pt was soiled) PT Visit Diagnosis: Muscle weakness (generalized) (M62.81);Other abnormalities of gait and mobility (R26.89)     Time: 1610-9604 PT Time Calculation (min) (ACUTE ONLY): 18 min  Charges:  $Therapeutic Activity: 8-22 mins                     Lewis Shock, PT, DPT Acute Rehabilitation Services Secure chat preferred Office #: [phone]    Iona Hansen 07/02/2022, 2:08 PM

## 2022-07-02 NOTE — Progress Notes (Signed)
PROGRESS NOTE  Diana [name]  DOB: 07-05-1933  PCP: Mattie Marlin, DO JYN:[id number]  DOA: 06/28/2022  LOS: 3 days  Hospital Day: 5  Brief narrative: Diana [name] is a 89 y.o. female with PMH significant for dementia, DM2, HTN, HLD, OSA on CPAP, CKD 4, chronic anemia,  hypothyroidism, recurrent UTIs on prophylactic antibiotics.   For the last 2 years, she has been living at [facility] independent living facility 4/20, patient was sent to the ED after she had 3 episodes of diarrhea and a speck of blood  Since January, patient has had several rounds of antimicrobials for infections including UTI, fungal infection as well as colitis.  Most recent course of antibiotics with 3 weeks ago for UTI.  She was recently started on prophylactic Keflex.  Frequent UTIs. 5 days ago, patient started to have abdominal cramping pain and diarrhea.  Initially stool was loose but became watery 3 days ago.  No fever or chills.  No nausea or vomiting but has poor oral intake.    In the ED, patient was afebrile, hemodynamically stable Labs with WC count normal, hemoglobin 9.1, platelet 251, potassium low at 3.1, BUN/creatinine 27/1.7 Urinalysis showed cloudy amber-colored urine with moderate leukocytes, many bacteria Stool assay with C. difficile antigen and toxin positive GI panel pending. CT abdomen pelvis showed acute uncomplicated diverticulitis of the descending and sigmoid colon, colonic diverticulosis. EDP discussed with infectious disease and was started on oral vancomycin Admitted to [facility].  Subjective: Patient was seen and examined this morning. Sitting up in recliner.  Not in distress.  Alert, awake, able to answer orientation questions.  Eating okay.  Labs this morning stable.  Assessment and plan: C. difficile colitis Acute uncomplicated diverticulitis of descending and sigmoid colon Presented with abdominal pain, watery diarrhea in the setting of recent frequent use of  antibiotics C. difficile antigen and toxin positive. CT abdomen with acute uncomplicated diverticulitis in the descending and sigmoid colon EDP discussed with ID on admission.  Recommended 10-day course of oral vancomycin. No fever.  WBC count normal. Diarrhea has improved.   Recent Labs  Lab 06/28/22 1341 06/29/22 1015 06/30/22 0319 07/02/22 0418  WBC 5.8 4.2 4.6 6.7   Dehydration CKD 3B Dehydration secondary to nausea vomiting and poor oral intake Creatinine remains in baseline range. Adequately hydrated.  Currently on IV fluid.  Encourage oral hydration.  ARB and Lasix on hold. Recent Labs    10/20/21 0812 11/16/21 0046 12/16/21 0855 03/15/22 1117 05/16/22 1908 05/16/22 1914 06/28/22 1341 06/29/22 1015 06/30/22 0319 07/02/22 0418  BUN 23 24* 29* 30* 28* 29* 27* 25* 30* 32*  CREATININE 1.50* 1.59* 1.52* 1.69* 1.74* 1.90* 1.70* 1.65* 1.99* 1.93*   Essential hypertension PTA on Toprol 12.5 mg daily, amlodipine 5 mg daily, losartan 50 mg twice daily, Lasix 20 mg daily as needed Currently continued on Toprol. Others on hold.  Blood pressure running elevated today.  Resume amlodipine and losartan.  Continue IV hydralazine as needed.  Continue to monitor blood pressure and resume accordingly.   Acute on chronic iron deficiency anemia Hemoglobin at baseline over 11.  Presented with a low hemoglobin 9.1.  There was mention of speck of blood in his stool.  Not an ideal candidate for endoscopic evaluation while going through infectious colitis.  No active bleeding.  Continue to monitor hemoglobin.  Continue Protonix twice daily, iron supplement Recent Labs    05/16/22 1914 06/28/22 1341 06/29/22 1015 06/30/22 0319 07/02/22 0418  HGB 11.9* 9.1*  8.7*  8.7* 8.4* 9.9*  MCV  --  95.1 94.6 95.5 93.0  FERRITIN  --   --  130  --   --   TIBC  --   --  165*  --   --   IRON  --   --  20*  --   --   RETICCTPCT  --   --  1.9  --   --    Recurrent UTIs No active UTI at this  time.  On prophylactic Keflex.  Currently on hold because of C. difficile colitis.  Type 2 diabetes mellitus A1c 8.4 on 08/26/2020 PTA on Lantus 8 units nightly Currently on Semglee 10 units daily along with sliding scale insulin with Accu-Cheks only.  Recent Labs  Lab 07/01/22 1206 07/01/22 1628 07/01/22 1941 07/02/22 0801 07/02/22 1220  GLUCAP 120* 135* 129* 133* 142*   HLD Crestor 10 mg nightly  Hypothyroidism Continue Synthroid   OSA CPAP nightly  Anxiety/depression Prozac  Impaired mobility PT eval obtained.  SNF recommended.  Goals of care   Code Status: Full Code     DVT prophylaxis:  SCDs Start: 06/28/22 1837   Antimicrobials: Oral vancomycin Fluid: Not on IV fluid Consultants: None currently Family Communication: Caregiver at bedside  Status: Inpatient Level of care:  Telemetry Medical   Patient from: Independent living facility Anticipated d/c to: SNF recommended by PT.  Diarrhea improved.  Medically stable pending SNF.   Diet:  Diet Order             Diet heart healthy/carb modified Room service appropriate? No; Fluid consistency: Thin  Diet effective now                   Scheduled Meds:  [START ON 07/03/2022] amLODipine  5 mg Oral Daily   brimonidine  1 drop Both Eyes BID   And   timolol  1 drop Both Eyes BID   ferrous sulfate  325 mg Oral q AM   insulin aspart  0-9 Units Subcutaneous TID WC   insulin glargine-yfgn  10 Units Subcutaneous Daily   levothyroxine  75 mcg Oral q AM   losartan  50 mg Oral BID   metoprolol succinate  12.5 mg Oral q AM   pantoprazole  40 mg Oral BID AC   rosuvastatin  10 mg Oral QHS   vancomycin  125 mg Oral QID    PRN meds: hydrALAZINE, HYDROmorphone (DILAUDID) injection, ondansetron **OR** ondansetron (ZOFRAN) IV   Infusions:   lactated ringers      Antimicrobials: Anti-infectives (From admission, onward)    Start     Dose/Rate Route Frequency Ordered Stop   06/28/22 1815  vancomycin  (VANCOCIN) capsule 125 mg        125 mg Oral 4 times daily 06/28/22 1813 07/08/22 1759       Nutritional status:  Body mass index is 25.01 kg/m.          Objective: Vitals:   07/02/22 0801 07/02/22 1219  BP: (!) 127/106 (!) 148/83  Pulse: 68 (!) 55  Resp: 18 18  Temp: 98 F (36.7 C) 98.1 F (36.7 C)  SpO2: 97% 100%    Intake/Output Summary (Last 24 hours) at 07/02/2022 1406 Last data filed at 07/01/2022 2300 Gross per 24 hour  Intake --  Output 1 ml  Net -1 ml   Filed Weights   06/28/22 1230 06/28/22 2046  Weight: 65.8 kg 74.6 kg   Weight change:  Body mass  index is 25.01 kg/m.   Physical Exam: General exam: Pleasant, elderly Caucasian female.  Not in pain Skin: No rashes, lesions or ulcers. HEENT: Atraumatic, normocephalic, no obvious bleeding Lungs: Clear to auscultation bilaterally CVS: Regular rate and rhythm, no murmur GI/Abd soft nontender, nondistended, bowel sounds in CNS: Open eyes and follow commands, oriented to place and person.  Able to have a conversation.  Demented at baseline. Psychiatry: mood appropriate Extremities: No pedal edema, no calf tenderness  Data Review: I have personally reviewed the laboratory data and studies available.  F/u labs  Unresulted Labs (From admission, onward)    None       Total time spent in review of labs and imaging, patient evaluation, formulation of plan, documentation and communication with family: 35 minutes  Signed, Lorin Glass, MD [facility] 07/02/2022 .

## 2022-07-03 DIAGNOSIS — A0472 Enterocolitis due to Clostridium difficile, not specified as recurrent: Secondary | ICD-10-CM | POA: Diagnosis not present

## 2022-07-03 LAB — GLUCOSE, CAPILLARY
Glucose-Capillary: 112 mg/dL — ABNORMAL HIGH (ref 70–99)
Glucose-Capillary: 94 mg/dL (ref 70–99)
Glucose-Capillary: 71 mg/dL (ref 70–99)

## 2022-07-03 MED ORDER — INSULIN ASPART 100 UNIT/ML IJ SOLN: 0.0000 [IU] | Freq: Three times a day (TID) | INTRAMUSCULAR | 11 refills | Status: DC

## 2022-07-03 MED ORDER — VANCOMYCIN HCL 125 MG PO CAPS: 125.0000 mg | ORAL_CAPSULE | Freq: Four times a day (QID) | ORAL | Status: AC

## 2022-07-03 NOTE — Progress Notes (Signed)
Report called @ [phone] and given to Nurse Burna Mortimer.

## 2022-07-03 NOTE — TOC Progression Note (Signed)
Transition of Care [facility]) - Progression Note    Patient Details  Name: Diana [name] MRN: [id number] Date of Birth: 22-May-1933  Transition of Care [facility]) CM/SW Contact  Zackarey Holleman A [place], [place] Phone Number: 07/03/2022, 11:05 AM  Clinical Narrative:     Patient authorization approved for [facility]  Auth ID: [id number] Reference ID: [id number]  CSW will update provider and family regarding discharge next steps.   TOC will continue to follow.  Expected Discharge Plan: Skilled Nursing Facility Barriers to Discharge: SNF Pending bed offer  Expected Discharge Plan and Services       Living arrangements for the past 2 months: Independent Living Facility ([facility])                                       Social Determinants of Health (SDOH) Interventions SDOH Screenings   Tobacco Use: Medium Risk (06/28/2022)    Readmission Risk Interventions    07/06/2020   12:17 PM  Readmission Risk Prevention Plan  Transportation Screening Complete  PCP or Specialist Appt within 5-7 Days Complete  Home Care Screening Complete  Medication Review (RN CM) Complete

## 2022-07-03 NOTE — TOC Transition Note (Signed)
Transition of Care [facility]) - CM/SW Discharge Note   Patient Details  Name: Diana [name] MRN: [id number] Date of Birth: 07-29-1933  Transition of Care [facility]) CM/SW Contact:  Gia Lusher A [place], [place]resia Majors Phone Number: 07/03/2022, 4:48 PM   Clinical Narrative:     Patient will DC to: [facility])  Anticipated DC date: 07/03/22  Family notified: Briant Cedar  Transport by: Sharin Mons      Per MD patient ready for DC to . RN, patient, patient's family, and facility notified of DC. Discharge Summary and FL2 sent to facility. RN to call report prior to discharge ( Room # [phone], [phone]). DC packet on chart. Ambulance transport requested for patient.     CSW will sign off for now as social work intervention is no longer needed. Please consult [place] again if new needs arise.   Final next level of care: Skilled Nursing Facility Barriers to Discharge: Barriers Resolved   Patient Goals and CMS Choice CMS Medicare.gov Compare Post Acute Care list provided to:: Patient Choice offered to / list presented to : Patient  Discharge Placement                Patient chooses bed at: [facility] Patient to be transferred to facility by: [facility] Name of family member notified: Briant Cedar Patient and family notified of of transfer: 07/03/22  Discharge Plan and Services Additional resources added to the After Visit Summary for                                       Social Determinants of Health (SDOH) Interventions SDOH Screenings   Tobacco Use: Medium Risk (06/28/2022)     Readmission Risk Interventions    07/06/2020   12:17 PM  Readmission Risk Prevention Plan  Transportation Screening Complete  PCP or Specialist Appt within 5-7 Days Complete  Home Care Screening Complete  Medication Review (RN CM) Complete

## 2022-07-03 NOTE — Discharge Summary (Signed)
Physician Discharge Summary  Diana [name] AVW:[id number] DOB: March 15, 1933 DOA: 06/28/2022  PCP: Diana Marlin, DO  Admit date: 06/28/2022 Discharge date: 07/03/2022  Admitted From: Independent living facility Discharge disposition: Skilled nursing facility   Brief narrative: Diana [name] is a 89 y.o. female with PMH significant for dementia, DM2, HTN, HLD, OSA on CPAP, CKD 4, chronic anemia,  hypothyroidism, recurrent UTIs on prophylactic antibiotics.   For the last 2 years, she has been living at [facility] independent living facility 4/20, patient was sent to the ED after she had 3 episodes of diarrhea and a speck of blood  Since January, patient has had several rounds of antimicrobials for infections including UTI, fungal infection as well as colitis.  Most recent course of antibiotics with 3 weeks ago for UTI.  She was recently started on prophylactic Keflex.  Frequent UTIs. 5 days ago, patient started to have abdominal cramping pain and diarrhea.  Initially stool was loose but became watery 3 days ago.  No fever or chills.  No nausea or vomiting but has poor oral intake.    In the ED, patient was afebrile, hemodynamically stable Labs with WC count normal, hemoglobin 9.1, platelet 251, potassium low at 3.1, BUN/creatinine 27/1.7 Urinalysis showed cloudy amber-colored urine with moderate leukocytes, many bacteria Stool assay with C. difficile antigen and toxin positive GI panel pending. CT abdomen pelvis showed acute uncomplicated diverticulitis of the descending and sigmoid colon, colonic diverticulosis. EDP discussed with infectious disease and was started on oral vancomycin Admitted to [facility].  Subjective: Patient was seen and examined this morning.  Not in distress.  No diarrhea.  Caregiver at bedside.  Assessment and plan: C. difficile colitis Acute uncomplicated diverticulitis of descending and sigmoid colon Presented with abdominal pain, watery diarrhea in  the setting of recent frequent use of antibiotics C. difficile antigen and toxin positive. CT abdomen with acute uncomplicated diverticulitis in the descending and sigmoid colon EDP discussed with ID on admission.  Recommended 10-day course of oral vancomycin. No fever.  WBC count normal. Diarrhea improved with oral vancomycin.  Adequately hydrated.  Continue oral vancomycin for 5 more days to complete 10-day course. Recent Labs  Lab 06/28/22 1341 06/29/22 1015 06/30/22 0319 07/02/22 0418  WBC 5.8 4.2 4.6 6.7   Dehydration CKD 3B Dehydration secondary to nausea vomiting and poor oral intake Creatinine remains in baseline range. Adequately hydrated.  Currently on IV fluid.  Encourage oral hydration.   Recent Labs    10/20/21 0812 11/16/21 0046 12/16/21 0855 03/15/22 1117 05/16/22 1908 05/16/22 1914 06/28/22 1341 06/29/22 1015 06/30/22 0319 07/02/22 0418  BUN 23 24* 29* 30* 28* 29* 27* 25* 30* 32*  CREATININE 1.50* 1.59* 1.52* 1.69* 1.74* 1.90* 1.70* 1.65* 1.99* 1.93*   Essential hypertension PTA on Toprol 12.5 mg daily, amlodipine 5 mg daily, losartan 50 mg twice daily, Lasix 20 mg daily as needed Currently continued on Toprol, amlodipine and losartan.  Resume Lasix as needed as before.   Acute on chronic iron deficiency anemia Hemoglobin at baseline over 11.  Presented with a low hemoglobin 9.1.  There was mention of speck of blood in his stool.  Not an ideal candidate for endoscopic evaluation while going through infectious colitis.  No active bleeding.  Continue to monitor hemoglobin.  Continue Protonix twice daily, iron supplement Recent Labs    05/16/22 1914 06/28/22 1341 06/29/22 1015 06/30/22 0319 07/02/22 0418  HGB 11.9* 9.1* 8.7*  8.7* 8.4* 9.9*  MCV  --  95.1 94.6 95.5 93.0  FERRITIN  --   --  130  --   --   TIBC  --   --  165*  --   --   IRON  --   --  20*  --   --   RETICCTPCT  --   --  1.9  --   --    Recurrent UTIs No active UTI at this time.   On prophylactic Keflex.  Currently on hold because of C. difficile colitis.  I would not resume it at discharge.  Type 2 diabetes mellitus A1c 8.4 on 08/26/2020 PTA on Lantus 8 units nightly with sliding Clinsol.  Resume the same. Recent Labs  Lab 07/02/22 1220 07/02/22 1555 07/02/22 2014 07/03/22 0731 07/03/22 1137  GLUCAP 142* 181* 127* 112* 94   HLD Crestor 10 mg nightly  Hypothyroidism Continue Synthroid   OSA CPAP nightly  Anxiety/depression Prozac  Impaired mobility PT eval obtained.  SNF recommended.  Goals of care   Code Status: Full Code   Wounds:  - Wound / Incision (Open or Dehisced) 09/12/21 Puncture Thigh Right;Upper (Active)  Date First Assessed/Time First Assessed: 09/12/21 1328   Wound Type: Puncture  Location: Thigh  Location Orientation: Right;Upper  Present on Admission: No    Assessments 09/12/2021  1:29 PM  Dressing Type Other (Comment)  Dressing Changed New  Dressing Status Clean, Dry, Intact  Dressing Change Frequency PRN  Site / Wound Assessment Clean;Dry  Peri-wound Assessment Intact  Drainage Amount None  Treatment Cleansed     No associated orders.    Discharge Exam:   Vitals:   07/02/22 1554 07/02/22 1933 07/03/22 0427 07/03/22 0730  BP: (!) 147/73 (!) 186/84 (!) 150/62 139/64  Pulse: 64 69 (!) 54 62  Resp: Temp: 98.5 F (36.9 C) (!) 97.4 F (36.3 C) 98.2 F (36.8 C)   TempSrc:  Oral    SpO2: 96% 97% 97% 96%  Weight:      Height:        Body mass index is 25.01 kg/m.   General exam: Pleasant, elderly Caucasian female.  Not in pain Skin: No rashes, lesions or ulcers. HEENT: Atraumatic, normocephalic, no obvious bleeding Lungs: Clear to auscultation bilaterally CVS: Regular rate and rhythm, no murmur GI/Abd soft nontender, nondistended, bowel sound present. CNS: Open eyes and follow commands, oriented to place and person.  Able to have a conversation.  Demented at baseline. Psychiatry: mood  appropriate Extremities: No pedal edema, no calf tenderness  Follow ups:    Follow-up Information     [name], Diana P, DO Follow up.   Specialty: Family Medicine Contact information: [address] [place] [address] [phone]                 Discharge Instructions:   Discharge Instructions     Call MD for:  difficulty breathing, headache or visual disturbances   Complete by: As directed    Call MD for:  extreme fatigue   Complete by: As directed    Call MD for:  hives   Complete by: As directed    Call MD for:  persistant dizziness or light-headedness   Complete by: As directed    Call MD for:  persistant nausea and vomiting   Complete by: As directed    Call MD for:  severe uncontrolled pain   Complete by: As directed    Call MD for:  temperature >100.4  Complete by: As directed    Diet general   Complete by: As directed    Discharge instructions   Complete by: As directed    General discharge instructions: Follow with Primary MD [name], Diana P, DO in 7 days  Please request your PCP  to go over your hospital tests, procedures, radiology results at the follow up. Please get your medicines reviewed and adjusted.  Your PCP may decide to repeat certain labs or tests as needed. Do not drive, operate heavy machinery, perform activities at heights, swimming or participation in water activities or provide baby sitting services if your were admitted for syncope or siezures until you have seen by Primary MD or a Neurologist and advised to do so again. [place] Controlled Substance Reporting System database was reviewed. Do not drive, operate heavy machinery, perform activities at heights, swim, participate in water activities or provide baby-sitting services while on medications for pain, sleep and mood until your outpatient physician has reevaluated you and advised to do so again.  You are strongly recommended to comply with the dose, frequency and duration  of prescribed medications. Activity: As tolerated with Full fall precautions use walker/cane & assistance as needed Avoid using any recreational substances like cigarette, tobacco, alcohol, or non-prescribed drug. If you experience worsening of your admission symptoms, develop shortness of breath, life threatening emergency, suicidal or homicidal thoughts you must seek medical attention immediately by calling 911 or calling your MD immediately  if symptoms less severe. You must read complete instructions/literature along with all the possible adverse reactions/side effects for all the medicines you take and that have been prescribed to you. Take any new medicine only after you have completely understood and accepted all the possible adverse reactions/side effects.  Wear Seat belts while driving. You were cared for by a hospitalist during your hospital stay. If you have any questions about your discharge medications or the care you received while you were in the hospital after you are discharged, you can call the unit and ask to speak with the hospitalist or the covering physician. Once you are discharged, your primary care physician will handle any further medical issues. Please note that NO REFILLS for any discharge medications will be authorized once you are discharged, as it is imperative that you return to your primary care physician (or establish a relationship with a primary care physician if you do not have one).   Increase activity slowly   Complete by: As directed        Discharge Medications:   Allergies as of 07/03/2022       Reactions   Codeine Sulfate Nausea Only   Sps [sodium Polystyrene Sulfonate] Other (See Comments)   Unknown reaction   Sulfa Antibiotics Other (See Comments)   Unknown reaction        Medication List     STOP taking these medications    amoxicillin-clavulanate 875-125 MG tablet Commonly known as: AUGMENTIN   cephALEXin 250 MG capsule Commonly known  as: KEFLEX   ciprofloxacin 500 MG tablet Commonly known as: Cipro   FLUoxetine 40 MG capsule Commonly known as: PROZAC   metFORMIN 500 MG tablet Commonly known as: GLUCOPHAGE   mirtazapine 7.5 MG tablet Commonly known as: REMERON       TAKE these medications    amLODipine 5 MG tablet Commonly known as: NORVASC Take 5 mg by mouth in the morning.   AZO CRANBERRY PO Take 1 tablet by mouth in the morning and at bedtime.  brimonidine-timolol 0.2-0.5 % ophthalmic solution Commonly known as: Combigan Place 1 drop into both eyes every 12 (twelve) hours.   ferrous sulfate 325 (65 FE) MG EC tablet Take 325 mg by mouth in the morning.   furosemide 20 MG tablet Commonly known as: LASIX Take 1 tablet (20 mg total) by mouth daily as needed (shortness of breath, take for 5 days). What changed: reasons to take this   insulin aspart 100 UNIT/ML injection Commonly known as: novoLOG Inject 0-9 Units into the skin 3 (three) times daily with meals.   insulin glargine 100 UNIT/ML injection Commonly known as: LANTUS Inject 8 Units into the skin at bedtime.   levothyroxine 75 MCG tablet Commonly known as: SYNTHROID Take 1 tablet by mouth in the morning.   losartan 50 MG tablet Commonly known as: COZAAR Take 1 tablet (50 mg total) by mouth 2 (two) times daily.   metoprolol succinate 25 MG 24 hr tablet Commonly known as: TOPROL-XL Take 0.5 tablets (12.5 mg total) by mouth daily. Take with or immediately following a meal. What changed:  when to take this additional instructions   pantoprazole 40 MG tablet Commonly known as: PROTONIX Take 1 tablet (40 mg total) by mouth 2 (two) times daily before a meal.   PRESERVISION AREDS 2 PO Take 1 capsule by mouth in the morning.   rosuvastatin 10 MG tablet Commonly known as: CRESTOR Take 10 mg by mouth at bedtime.   vancomycin 125 MG capsule Commonly known as: VANCOCIN Take 1 capsule (125 mg total) by mouth 4 (four) times daily  for 5 days.   Vitamin D 50 MCG (2000 UT) tablet Take 2,000 Units by mouth in the morning.         The results of significant diagnostics from this hospitalization (including imaging, microbiology, ancillary and laboratory) are listed below for reference.    Procedures and Diagnostic Studies:   CT ABDOMEN PELVIS WO CONTRAST  Result Date: 06/28/2022 CLINICAL DATA:  Right lower quadrant abdominal pain. EXAM: CT ABDOMEN AND PELVIS WITHOUT CONTRAST TECHNIQUE: Multidetector CT imaging of the abdomen and pelvis was performed following the standard protocol without IV contrast. RADIATION DOSE REDUCTION: This exam was performed according to the departmental dose-optimization program which includes automated exposure control, adjustment of the mA and/or kV according to patient size and/or use of iterative reconstruction technique. COMPARISON:  CT abdomen pelvis dated 05/16/2022. FINDINGS: Lower chest: There is a moderate right and small left pleural effusion with associated atelectasis. The heart is mildly enlarged. There is a large hiatal hernia. Hepatobiliary: No focal liver abnormality is seen. Status post cholecystectomy. No biliary dilatation. Pancreas: Unremarkable. No pancreatic ductal dilatation or surrounding inflammatory changes. Spleen: Normal in size without focal abnormality. Adrenals/Urinary Tract: Adrenal glands are unremarkable. There is a 3 mm calculus in the left kidney which is likely nonobstructive. No renal calculi is seen on the right. Left renal cysts are redemonstrated. No imaging follow-up is recommended for this finding. Both kidneys are atrophic. Bladder is unremarkable. Stomach/Bowel: Stomach is within normal limits. The appendix is not definitely identified, however no pericecal inflammatory changes are noted to suggest acute appendicitis. There is colonic diverticulosis with wall thickening and associated fat stranding involving the descending and sigmoid colon, similar to prior  exam. No definite abscess formation or fistula formation. No evidence of bowel obstruction. Vascular/Lymphatic: Aortic atherosclerosis. No enlarged abdominal or pelvic lymph nodes. Reproductive: Status post hysterectomy. No adnexal masses. Other: There is a small fat containing paraumbilical hernia. No significant free  intraperitoneal fluid. Musculoskeletal: Severe degenerative changes are seen in the spine. Vertebroplasty changes at L1 are redemonstrated with 10 mm retropulsion into the central canal at this level with moderate to severe central canal stenosis, unchanged. IMPRESSION: 1. Acute uncomplicated diverticulitis of the descending and sigmoid colon, similar to prior. 2. Moderate right and small left pleural effusions with associated atelectasis. Aortic Atherosclerosis (ICD10-I70.0). Electronically Signed   By: Romona Curls M.D.   On: 06/28/2022 16:56     Labs:   Basic Metabolic Panel: Recent Labs  Lab 06/28/22 1341 06/29/22 1015 06/30/22 0319 07/02/22 0418  NA 142 140 137 137  K 3.1* 4.3 4.5 4.9  CL 115* 115* 113* 110  CO2 19* 17* 18* 21*  GLUCOSE 142* 100* 146* 152*  BUN 27* 25* 30* 32*  CREATININE 1.70* 1.65* 1.99* 1.93*  CALCIUM 8.4* 8.3* 8.0* 9.1  MG  --  2.0  --   --   PHOS  --  3.6  --   --    GFR Estimated Creatinine Clearance: 21.1 mL/min (A) (by C-G formula based on SCr of 1.93 mg/dL (H)). Liver Function Tests: Recent Labs  Lab 06/28/22 1341  AST 15  ALT 14  ALKPHOS 52  BILITOT 0.4  PROT 5.3*  ALBUMIN 2.3*   Recent Labs  Lab 06/28/22 1341  LIPASE 29   No results for input(s): "AMMONIA" in the last 168 hours. Coagulation profile No results for input(s): "INR", "PROTIME" in the last 168 hours.  CBC: Recent Labs  Lab 06/28/22 1341 06/29/22 1015 06/30/22 0319 07/02/22 0418  WBC 5.8 4.2 4.6 6.7  NEUTROABS 4.0  --  2.7 4.8  HGB 9.1* 8.7*  8.7* 8.4* 9.9*  HCT 29.4* 28.2*  28.1* 27.3* 31.7*  MCV 95.1 94.6 95.5 93.0  PLT 251 244 243 285    Cardiac Enzymes: No results for input(s): "CKTOTAL", "CKMB", "CKMBINDEX", "TROPONINI" in the last 168 hours. BNP: Invalid input(s): "POCBNP" CBG: Recent Labs  Lab 07/02/22 1220 07/02/22 1555 07/02/22 2014 07/03/22 0731 07/03/22 1137  GLUCAP 142* 181* 127* 112* 94   D-Dimer No results for input(s): "DDIMER" in the last 72 hours. Hgb A1c No results for input(s): "HGBA1C" in the last 72 hours. Lipid Profile No results for input(s): "CHOL", "HDL", "LDLCALC", "TRIG", "CHOLHDL", "LDLDIRECT" in the last 72 hours. Thyroid function studies No results for input(s): "TSH", "T4TOTAL", "T3FREE", "THYROIDAB" in the last 72 hours.  Invalid input(s): "FREET3" Anemia work up No results for input(s): "VITAMINB12", "FOLATE", "FERRITIN", "TIBC", "IRON", "RETICCTPCT" in the last 72 hours. Microbiology Recent Results (from the past 240 hour(s))  C Difficile Quick Screen w PCR reflex     Status: Abnormal   Collection Time: 06/28/22  5:01 PM   Specimen: STOOL  Result Value Ref Range Status   C Diff antigen POSITIVE (A) NEGATIVE Final   C Diff toxin POSITIVE (A) NEGATIVE Final   C Diff interpretation Toxin producing C. difficile detected.  Final    Comment: CRITICAL RESULT CALLED TO, READ BACK BY AND VERIFIED WITH:  Rolley Sims, RN 06/28/22 1737 A. LAFRANCE Performed at [facility], [address] [place], [place] [address]   Gastrointestinal Panel by PCR , Stool     Status: None   Collection Time: 06/28/22  5:02 PM   Specimen: STOOL  Result Value Ref Range Status   Campylobacter species NOT DETECTED NOT DETECTED Final   Plesimonas shigelloides NOT DETECTED NOT DETECTED Final   Salmonella species NOT DETECTED NOT DETECTED Final  Yersinia enterocolitica NOT DETECTED NOT DETECTED Final   Vibrio species NOT DETECTED NOT DETECTED Final   Vibrio cholerae NOT DETECTED NOT DETECTED Final   Enteroaggregative E coli (EAEC) NOT DETECTED NOT DETECTED Final   Enteropathogenic E coli (EPEC)  NOT DETECTED NOT DETECTED Final   Enterotoxigenic E coli (ETEC) NOT DETECTED NOT DETECTED Final   Shiga like toxin producing E coli (STEC) NOT DETECTED NOT DETECTED Final   Shigella/Enteroinvasive E coli (EIEC) NOT DETECTED NOT DETECTED Final   Cryptosporidium NOT DETECTED NOT DETECTED Final   Cyclospora cayetanensis NOT DETECTED NOT DETECTED Final   Entamoeba histolytica NOT DETECTED NOT DETECTED Final   Giardia lamblia NOT DETECTED NOT DETECTED Final   Adenovirus F40/41 NOT DETECTED NOT DETECTED Final   Astrovirus NOT DETECTED NOT DETECTED Final   Norovirus GI/GII NOT DETECTED NOT DETECTED Final   Rotavirus A NOT DETECTED NOT DETECTED Final   Sapovirus (I, II, IV, and V) NOT DETECTED NOT DETECTED Final    Comment: Performed at [facility], [address], [place], [place] [address]  Urine Culture     Status: Abnormal   Collection Time: 06/29/22  4:50 AM   Specimen: Urine, Random  Result Value Ref Range Status   Specimen Description URINE, RANDOM  Final   Special Requests   Final    URINE, CLEAN CATCH Performed at [facility], [address], [place], [place] [address]    Culture >=100,000 COLONIES/mL ENTEROCOCCUS FAECALIS (A)  Final   Report Status 07/01/2022 FINAL  Final   Organism ID, Bacteria ENTEROCOCCUS FAECALIS (A)  Final      Susceptibility   Enterococcus faecalis - MIC*    AMPICILLIN <=2 SENSITIVE Sensitive     NITROFURANTOIN <=16 SENSITIVE Sensitive     VANCOMYCIN 1 SENSITIVE Sensitive     * >=100,000 COLONIES/mL ENTEROCOCCUS FAECALIS    Time coordinating discharge: 35 minutes  Signed: Lawanna Cecere  [facility] 07/03/2022, 11:50 AM  .

## 2022-07-08 ENCOUNTER — Encounter: Payer: Self-pay | Admitting: Neurology

## 2022-07-08 ENCOUNTER — Other Ambulatory Visit: Payer: Medicare PPO

## 2022-07-10 ENCOUNTER — Encounter: Payer: Self-pay | Admitting: Neurology

## 2022-07-10 ENCOUNTER — Ambulatory Visit: Payer: Medicare PPO | Admitting: Neurology

## 2022-07-10 VITALS — BP 153/75 | Wt 159.0 lb

## 2022-07-10 DIAGNOSIS — G301 Alzheimer's disease with late onset: Secondary | ICD-10-CM | POA: Diagnosis not present

## 2022-07-10 DIAGNOSIS — F02C0 Dementia in other diseases classified elsewhere, severe, without behavioral disturbance, psychotic disturbance, mood disturbance, and anxiety: Secondary | ICD-10-CM | POA: Diagnosis not present

## 2022-07-10 MED ORDER — MEMANTINE HCL 10 MG PO TABS: 10.0000 mg | ORAL_TABLET | Freq: Two times a day (BID) | ORAL | 11 refills | Status: AC

## 2022-07-10 NOTE — Progress Notes (Signed)
[facility]  PATIENT: Diana [name] DOB: March 02, 1934  REQUESTING CLINICIAN: Lazoff, Shawn P, DO HISTORY FROM: Caregiver/Paper referral  REASON FOR VISIT: Dementia    HISTORICAL  CHIEF COMPLAINT:  Chief Complaint  Patient presents with   New Patient (Initial Visit)    Rm 12, with caregiver Shelia, new pt. dementia    HISTORY OF PRESENT ILLNESS:  This is a 89 year old woman past medical history of dementia, diabetes, hypertension hyperlipidemia, obstructive sleep apnea, CKD who was referred by PCP for ?evaluation/management for dementia She is accompanied by her caregiver Velna Hatchet who has been working with patient since November 2021.  Caregiver started working with patient in 2021 due to memory loss/dementia.  She has noticed since that time her memory has been getting worse.  Initially she was living at home, and needed additional help at some point she went to [facility] independent living facility. She was recently admitted to the hospital for Cdiff colitis and on discharge, sent to the [facility] but in the Assisted living facility. Caregiver reports that at the facility,  she needs help with her food, help with the clothes, help with her shower, looks like she needs total care. She is fully dependent in all ADLs. Review of meds list did not include medications for dementia such as Aricept or Namenda.    Functional status: Dependent in all ADLs and IADLs Patient lives in an assisted living facility. Cooking: no Cleaning: no Shopping: no Bathing: no Toileting: no Driving: no  Ever left the stove on by accident?: n/a Forget how to use items around the house?: yes Getting lost going to familiar places?: yes Forgetting loved ones names?: yes Word finding difficulty? yes Sleep: ok   OTHER MEDICAL CONDITIONS: Dementia, DM2, HTN, HLD, OSA, CKD   REVIEW OF SYSTEMS: Full 14 system review of systems performed and negative with exception of: Unable  to fully obtain due to mental status.   ALLERGIES: Allergies  Allergen Reactions   Codeine Sulfate Nausea Only   Sps [Sodium Polystyrene Sulfonate] Other (See Comments)    Unknown reaction   Sulfa Antibiotics Other (See Comments)    Unknown reaction    HOME MEDICATIONS: Outpatient Medications Prior to Visit  Medication Sig Dispense Refill   amLODipine (NORVASC) 5 MG tablet Take 5 mg by mouth in the morning.     brimonidine-timolol (COMBIGAN) 0.2-0.5 % ophthalmic solution Place 1 drop into both eyes every 12 (twelve) hours. 5 mL 0   Cholecalciferol (VITAMIN D) 50 MCG (2000 UT) tablet Take 2,000 Units by mouth in the morning.     Cranberry-Vitamin C-Probiotic (AZO CRANBERRY PO) Take 1 tablet by mouth in the morning and at bedtime.     ferrous sulfate 325 (65 FE) MG EC tablet Take 325 mg by mouth in the morning.     furosemide (LASIX) 20 MG tablet Take 1 tablet (20 mg total) by mouth daily as needed (shortness of breath, take for 5 days). (Patient taking differently: Take 20 mg by mouth daily as needed for fluid or edema.) 90 tablet 3   insulin aspart (NOVOLOG) 100 UNIT/ML injection Inject 0-9 Units into the skin 3 (three) times daily with meals. 10 mL 11   insulin glargine (LANTUS) 100 UNIT/ML injection Inject 8 Units into the skin at bedtime.     LACTOBACILLUS PO Take by mouth daily.     levothyroxine (SYNTHROID) 75 MCG tablet Take 1 tablet by mouth in the morning.     losartan (COZAAR) 50 MG  tablet Take 1 tablet (50 mg total) by mouth 2 (two) times daily. 60 tablet 0   metoprolol succinate (TOPROL-XL) 25 MG 24 hr tablet Take 0.5 tablets (12.5 mg total) by mouth daily. Take with or immediately following a meal. (Patient taking differently: Take 12.5 mg by mouth in the morning.) 45 tablet 3   Multiple Vitamins-Minerals (PRESERVISION AREDS 2 PO) Take 1 capsule by mouth in the morning.     rosuvastatin (CRESTOR) 10 MG tablet Take 10 mg by mouth at bedtime.     mirtazapine (REMERON) 7.5 MG  tablet Take 7.5 mg by mouth at bedtime. (Patient not taking: Reported on 07/10/2022)     pantoprazole (PROTONIX) 40 MG tablet Take 1 tablet (40 mg total) by mouth 2 (two) times daily before a meal. (Patient taking differently: Take 40 mg by mouth as directed. One tablet every other day) 60 tablet 0   vancomycin (VANCOCIN) 125 MG capsule Take 1 capsule (125 mg total) by mouth 4 (four) times daily for 5 days.     No facility-administered medications prior to visit.    PAST MEDICAL HISTORY: Past Medical History:  Diagnosis Date   Anemia    Anemia of decreased vitamin B12 absorption 05/26/2005   Arthritis    Atrial fib/flutter, transient (HCC)    Atrial fibrillation (HCC)    Breast cancer (HCC) 2001   Cataract    GERD (gastroesophageal reflux disease)    Hiatal hernia    History of tobacco abuse    Hx of colonic polyps    Hyperlipidemia    Hypertension    Hypothyroidism    Memory loss    Obesity    OSA on CPAP 09/2007   AHI 10.6/hr overall, 43.64/hr during REM, lost weight, does not use cpap   Osteopenia    Osteoporosis 05/31/2015   Renal insufficiency    Type 2 diabetes mellitus (HCC)     PAST SURGICAL HISTORY: Past Surgical History:  Procedure Laterality Date   BACK SURGERY  05/30/2009   BIOPSY  01/01/2019   Procedure: BIOPSY;  Surgeon: Rachael Fee, MD;  Location: [facility] ENDOSCOPY;  Service: Endoscopy;;   BOWEL RESECTION  1974   CATARACT EXTRACTION Bilateral    bilateral   CHOLECYSTECTOMY  1974   COLONOSCOPY     ESOPHAGOGASTRODUODENOSCOPY N/A 01/01/2019   Procedure: ESOPHAGOGASTRODUODENOSCOPY (EGD);  Surgeon: Rachael Fee, MD;  Location: [facility] ENDOSCOPY;  Service: Endoscopy;  Laterality: N/A;   EXTERNAL FIXATION REMOVAL Left 04/27/2020   Procedure: REMOVAL EXTERNAL FIXATION ARM;  Surgeon: Roby Lofts, MD;  Location: [facility] OR;  Service: Orthopedics;  Laterality: Left;   MASTECTOMY Bilateral 2001   bilateral sentinel lymph nodes bio   NM MYOCAR PERF WALL MOTION  06/2007    dipyridamole; perfusion defect in inferior myocardium consistent with diaphragmatic attenuation, remaining myocardium with no ischemia/infarct, EF 73%; normal, low risk scan    ORIF ELBOW FRACTURE Left 03/21/2020   Procedure: OPEN REDUCTION INTERNAL FIXATION (ORIF) ELBOW/OLECRANON FRACTURE;  Surgeon: Roby Lofts, MD;  Location: [facility] OR;  Service: Orthopedics;  Laterality: Left;   TOTAL ABDOMINAL HYSTERECTOMY  1975   TRANSTHORACIC ECHOCARDIOGRAM  02/2010   EF=>55%, stage 1 diastolic dysfunction; borderline RV enlargement; LA mild-mod dilated; mild mitral annular calcif & mild MR; mild TR with normal RSVP, AV moderately sclerotics    FAMILY HISTORY: Family History  Problem Relation Age of Onset   Heart attack Mother    Heart attack Father    Colon cancer Neg Hx  SOCIAL HISTORY: Social History   Socioeconomic History   Marital status: Widowed    Spouse name: Not on file   Number of children: Not on file   Years of education: Not on file   Highest education level: Not on file  Occupational History   Not on file  Tobacco Use   Smoking status: Former    Years: 13    Types: Cigarettes    Quit date: 05/02/2002    Years since quitting: 20.2   Smokeless tobacco: Never  Vaping Use   Vaping Use: Never used  Substance and Sexual Activity   Alcohol use: No    Alcohol/week: 0.0 standard drinks of alcohol   Drug use: Never   Sexual activity: Not Currently    Birth control/protection: Surgical    Comment: Hysterectomy  Other Topics Concern   Not on file  Social History Narrative   Lives alone but currently at rehab facility    Social Determinants of Health   Financial Resource Strain: Not on file  Food Insecurity: Not on file  Transportation Needs: Not on file  Physical Activity: Not on file  Stress: Not on file  Social Connections: Not on file  Intimate Partner Violence: Not on file     PHYSICAL EXAM  GENERAL EXAM/CONSTITUTIONAL: Vitals:  Vitals:   07/10/22 1028   BP: (!) 153/75  Weight: 159 lb (72.1 kg)   Body mass index is 24.18 kg/m. Wt Readings from Last 3 Encounters:  07/10/22 159 lb (72.1 kg)  06/28/22 164 lb 7.4 oz (74.6 kg)  06/10/22 158 lb (71.7 kg)   Patient is in no distress; well developed, nourished and groomed; neck is supple  MUSCULOSKELETAL: Gait, strength, tone, movements noted in Neurologic exam below  NEUROLOGIC: MENTAL STATUS:      No data to display            07/10/2022   10:39 AM  Montreal Cognitive Assessment   Visuospatial/ Executive (0/5) 0  Naming (0/3) 3  Attention: Read list of digits (0/2) 1  Attention: Read list of letters (0/1) 0  Attention: Serial 7 subtraction starting at 100 (0/3) 0  Language: Repeat phrase (0/2) 1  Language : Fluency (0/1) 0  Abstraction (0/2) 1  Delayed Recall (0/5) 0  Orientation (0/6) 0  Total 6     CRANIAL NERVE:  2nd, 3rd, 4th, 6th- visual fields full to confrontation 5th - facial sensation symmetric 7th - facial strength symmetric 8th - hearing intact 9th - palate elevates symmetrically, uvula midline 11th - shoulder shrug symmetric 12th - tongue protrusion midline  MOTOR:  normal bulk and tone, at least antigravity.   SENSORY:  normal and symmetric to light touch  COORDINATION:  finger-nose-finger, fine finger movements normal  GAIT/STATION:  Deferred, in a wheelchair      DIAGNOSTIC DATA (LABS, IMAGING, TESTING) - I reviewed patient records, labs, notes, testing and imaging myself where available.  Lab Results  Component Value Date   WBC 6.7 07/02/2022   HGB 9.9 (L) 07/02/2022   HCT 31.7 (L) 07/02/2022   MCV 93.0 07/02/2022   PLT 285 07/02/2022      Component Value Date/Time   NA 137 07/02/2022 0418   NA 136 (A) 04/02/2020 0000   K 4.9 07/02/2022 0418   CL 110 07/02/2022 0418   CO2 21 (L) 07/02/2022 0418   GLUCOSE 152 (H) 07/02/2022 0418   BUN 32 (H) 07/02/2022 0418   BUN 23 (A) 04/02/2020 0000   CREATININE 1.93 (  H) 07/02/2022 0418    CALCIUM 9.1 07/02/2022 0418   PROT 5.3 (L) 06/28/2022 1341   ALBUMIN 2.3 (L) 06/28/2022 1341   AST 15 06/28/2022 1341   ALT 14 06/28/2022 1341   ALKPHOS 52 06/28/2022 1341   BILITOT 0.4 06/28/2022 1341   GFRNONAA 25 (L) 07/02/2022 0418   GFRAA 55.11 04/02/2020 0000   Lab Results  Component Value Date   CHOL 85 04/02/2020   HDL 35 04/02/2020   LDLCALC 38 04/02/2020   TRIG 58 04/02/2020   CHOLHDL 2.7 07/05/2009   Lab Results  Component Value Date   HGBA1C 6.2 (H) 06/29/2022   Lab Results  Component Value Date   VITAMINB12 1,040 (H) 12/31/2018   Lab Results  Component Value Date   TSH 1.80 04/02/2020    Head CT 03/15/2022 1. No acute intracranial abnormality. 2. Chronic small vessel ischemic disease and brain atrophy. 3. No evidence for cervical spine fracture or subluxation. 4. Cervical degenerative disc disease.    ASSESSMENT AND PLAN  89 y.o. year old female with history of dementia, diabetes, hypertension hyperlipidemia, obstructive sleep apnea, CKD who was referred by PCP for ?evaluation/management for dementia. Patient has an established diagnosis of dementia, 6/30 on the [place] indicative of severe impairment.  We can add Namenda 10 mg twice daily but unclear benefit at the at this time but we will definitely recommend patient to be admitted to a nursing home with memory unit.  Please continue to follow with PCP return as needed.   1. Severe late onset Alzheimer's dementia without behavioral disturbance, psychotic disturbance, mood disturbance, or anxiety (HCC)     Patient Instructions  Continue current medications  Consider adding Namenda 10 mg twice daily  Consider admitted patient to a nursing home with memory unit.  Continue to follow up with PCP   No orders of the defined types were placed in this encounter.   Meds ordered this encounter  Medications   memantine (NAMENDA) 10 MG tablet    Sig: Take 1 tablet (10 mg total) by mouth 2 (two) times daily.     Dispense:  60 tablet    Refill:  11    Return if symptoms worsen or fail to improve.    Windell Norfolk, MD 07/10/2022, 12:05 PM  [facility] [address], [address] [place], [place] [address] [phone]

## 2022-07-10 NOTE — Patient Instructions (Signed)
Continue current medications  Consider adding Namenda 10 mg twice daily  Consider admitted patient to a nursing home with memory unit.  Continue to follow up with PCP

## 2022-07-15 ENCOUNTER — Ambulatory Visit: Payer: Medicare PPO | Admitting: Nurse Practitioner

## 2022-07-15 ENCOUNTER — Encounter: Payer: Self-pay | Admitting: Nurse Practitioner

## 2022-07-15 ENCOUNTER — Other Ambulatory Visit: Payer: Medicare PPO

## 2022-07-15 VITALS — BP 120/60 | HR 64 | Ht 68.0 in | Wt 159.0 lb

## 2022-07-15 DIAGNOSIS — A0472 Enterocolitis due to Clostridium difficile, not specified as recurrent: Secondary | ICD-10-CM | POA: Diagnosis not present

## 2022-07-15 NOTE — Patient Instructions (Addendum)
_______________________________________________________  If your blood pressure at your visit was 140/90 or greater, please contact your primary care physician to follow up on this. _______________________________________________________  If you are age 87 or older, your body mass index should be between 23-30. Your Body mass index is 24.18 kg/m. If this is out of the aforementioned range listed, please consider follow up with your Primary Care Provider. ________________________________________________________  The [facility] providers would like to encourage you to use [facility] to communicate with providers for non-urgent requests or questions.  Due to long hold times on the telephone, sending your provider a message by [facility] may be a faster and more efficient way to get a response.  Please allow 48 business hours for a response.  Please remember that this is for non-urgent requests.  _______________________________________________________  Please return stool kit as directed.   Thank you for entrusting me with your care and choosing [facility].  Willette Cluster, NP

## 2022-07-15 NOTE — Progress Notes (Signed)
Assessment and Plan   Primary NW:GNFAOZH Nandigam, MD  Brief Narrative:  89 y.o. yo female with a past medical history consisting of, but not limited to large hiatal hernia, chronic loose stool, colon polyps, diverticulitis, dementia, OSA on CPAP, CKD 4, Afib   #  Abnormal CT scans.  Scan in October 2023 suggested left sided diverticulitis. Repeat CT scan on 05/16/22 suggested left sided colitis and repeat non-contrast scan on 4/20 suggested left sided diverticulitis again. Further complicating the picture,  she did test positive for C-diff in late April.  She may have had diverticulitis and then may in fact have had C. difficile colitis.  Underlying colon neoplasm is also possible given her history of advanced polyps.  - She isn't a candidate for colonoscopy given advanced age. multiple comorbidities and dementia.  This was already decided at her last visit here in November 2023 -Patient is at risk for recurrent C. difficile infection given her frequent use of antibiotics for UTIs.  I discussed this with her caregiver at [facility] independent living.  In the future should she develop recurrent diarrhea then repeat C. difficile testing is indicated.  I put a standing order in epic so that the caregiver can take a specimen cup back to patient's home  # History of colon polyps. Multiple adenomatous colon polyps, including a tubulovillous adenoma were removed at time of last colonoscopy in 2015.  At time of last visit here in November I called her sister-in-law "[name]" to get her thoughts.  [name] didn't want to subject patient to anesthesia. She understands that recurrent colon polyps/colon cancer is always possible. Caregiver present today doesn't think patient would be able to prep bowels even if colonoscopy were pursued.   # Dementia  # Renal lesion, indeterminate on non-contrast CT scan.   History of Present Illness   Chief complaint:  recent C-diff / colitis on CT scan   Patient was  last seen 01/21/2022 for follow-up on acute diverticulitis, chronic loose stool and history of colon polyps. Refer to that office note for details.  Colonoscopy was discussed with her caregiver as well as her sister-in-law all over the telephone.  Patient has dementia, unable to make her own decisions.  It was decided that colonoscopy would not be pursued  Interval History:  Patient went to ED  on 05/16/22 for diarrhea with blood. Non-contrast CT scan  showed acute sigmoid colitis and proctitis. Other findings included a large hiatal hernia and a 1 cm left renal lesion.  She was discharged from the ED with Augmentin .  Patient returned to ED on 4/20 with crampy diarrhea. Non-contrast CT scan repeated and showed acute uncomplicated diverticulitis of the descending and sigmoid colon.  Stool studies were positive for C. Difficile which is not surprising as she had been on multiple courses of antibiotics for frequent UTIs. Started on antibiotics. Discharged to SNF with 5 days left of Vancomycin.   Patient is here with her caregiver from Independent part of [place] though patient is currently in SNF. Caregiver Velna Hatchet tells me this appointment was made prior to patient's recent hospital admission.   Patient unable to provide history. She is reportedly having "mushy " stools.      Previous GI Endoscopies / Labs / Imaging  May 2015 colonoscopy  -ENDOSCOPIC IMPRESSION: 1. There was moderate diverticulosis noted throughout the entire examined colon 2. Eight semi-pedunculated and sessile polyps measuring 3-15 mm in size were found throughout the entire examined colon; polypectomy was performed with  cold forceps, with a cold snare and using snare cautery       Latest Ref Rng & Units 06/28/2022    1:41 PM 05/16/2022    7:08 PM 03/15/2022   11:17 AM  Hepatic Function  Total Protein 6.5 - 8.1 g/dL 5.3  5.9  6.5   Albumin 3.5 - 5.0 g/dL 2.3  2.4  3.1   AST 15 - 41 U/L 15  16  26    ALT 0 - 44 U/L 14  13  24     Alk Phosphatase 38 - 126 U/L 52  54  58   Total Bilirubin 0.3 - 1.2 mg/dL 0.4  0.9  1.2        Latest Ref Rng & Units 07/02/2022    4:18 AM 06/30/2022    3:19 AM 06/29/2022   10:15 AM  CBC  WBC 4.0 - 10.5 K/uL 6.7  4.6  4.2   Hemoglobin 12.0 - 15.0 g/dL 9.9  8.4  8.7    8.7   Hematocrit 36.0 - 46.0 % 31.7  27.3  28.1    28.2   Platelets 150 - 400 K/uL 285  243  244    05/16/22 non-contrast CT scan  IMPRESSION: 1. Colonic diverticulosis with rectosigmoid pericolonic fat stranding. No focal inflammatory finding. Findings more suggestive of sigmoid colitis and proctitis versus less likely colonic diverticulitis. Markedly limited evaluation on this noncontrast study. Recommend colonoscopy status post treatment and status post complete resolution of inflammatory changes to exclude an underlying lesion. 2. Large hiatal hernia. 3. Indeterminate 1 cm left renal lesion. When the patient is clinically stable and able to follow directions and hold their breath (preferably as an outpatient) further evaluation with dedicated abdominal MRI renal protocol should be considered. 4. Marked vertebral body height loss of the L1 level status post kyphoplasty. Associated 1 cm retropulsion into the central canal leading to at least moderate central canal stenosis. 5. Diffusely decreased bone density. 6.  Aortic Atherosclerosis (ICD10-I70.0)  06/28/22 noncontrast CT scan IMPRESSION: 1. Acute uncomplicated diverticulitis of the descending and sigmoid colon, similar to prior. 2. Moderate right and small left pleural effusions with associated atelectasis.  Past Medical History:  Diagnosis Date   Anemia    Anemia of decreased vitamin B12 absorption 05/26/2005   Arthritis    Atrial fib/flutter, transient (HCC)    Atrial fibrillation (HCC)    Breast cancer (HCC) 2001   Cataract    GERD (gastroesophageal reflux disease)    Hiatal hernia    History of tobacco abuse    Hx of colonic polyps     Hyperlipidemia    Hypertension    Hypothyroidism    Memory loss    Obesity    OSA on CPAP 09/2007   AHI 10.6/hr overall, 43.64/hr during REM, lost weight, does not use cpap   Osteopenia    Osteoporosis 05/31/2015   Renal insufficiency    Type 2 diabetes mellitus [facility]     Past Surgical History:  Procedure Laterality Date   BACK SURGERY  05/30/2009   BIOPSY  01/01/2019   Procedure: BIOPSY;  Surgeon: Rachael Fee, MD;  Location: [facility] ENDOSCOPY;  Service: Endoscopy;;   BOWEL RESECTION  1974   CATARACT EXTRACTION Bilateral    bilateral   CHOLECYSTECTOMY  1974   COLONOSCOPY     ESOPHAGOGASTRODUODENOSCOPY N/A 01/01/2019   Procedure: ESOPHAGOGASTRODUODENOSCOPY (EGD);  Surgeon: Rachael Fee, MD;  Location: [facility] ENDOSCOPY;  Service: Endoscopy;  Laterality: N/A;   EXTERNAL FIXATION  REMOVAL Left 04/27/2020   Procedure: REMOVAL EXTERNAL FIXATION ARM;  Surgeon: Roby Lofts, MD;  Location: [facility] OR;  Service: Orthopedics;  Laterality: Left;   MASTECTOMY Bilateral 2001   bilateral sentinel lymph nodes bio   NM MYOCAR PERF WALL MOTION  06/2007   dipyridamole; perfusion defect in inferior myocardium consistent with diaphragmatic attenuation, remaining myocardium with no ischemia/infarct, EF 73%; normal, low risk scan    ORIF ELBOW FRACTURE Left 03/21/2020   Procedure: OPEN REDUCTION INTERNAL FIXATION (ORIF) ELBOW/OLECRANON FRACTURE;  Surgeon: Roby Lofts, MD;  Location: [facility] OR;  Service: Orthopedics;  Laterality: Left;   TOTAL ABDOMINAL HYSTERECTOMY  1975   TRANSTHORACIC ECHOCARDIOGRAM  02/2010   EF=>55%, stage 1 diastolic dysfunction; borderline RV enlargement; LA mild-mod dilated; mild mitral annular calcif & mild MR; mild TR with normal RSVP, AV moderately sclerotics    Current Medications, Allergies, Family History and Social History were reviewed in [employer] record.     Current Outpatient Medications  Medication Sig Dispense Refill   amLODipine (NORVASC) 5  MG tablet Take 5 mg by mouth in the morning.     brimonidine-timolol (COMBIGAN) 0.2-0.5 % ophthalmic solution Place 1 drop into both eyes every 12 (twelve) hours. 5 mL 0   Cholecalciferol (VITAMIN D) 50 MCG (2000 UT) tablet Take 2,000 Units by mouth in the morning.     Cranberry-Vitamin C-Probiotic (AZO CRANBERRY PO) Take 1 tablet by mouth in the morning and at bedtime.     ferrous sulfate 325 (65 FE) MG EC tablet Take 325 mg by mouth in the morning.     furosemide (LASIX) 20 MG tablet Take 1 tablet (20 mg total) by mouth daily as needed (shortness of breath, take for 5 days). (Patient taking differently: Take 20 mg by mouth daily as needed for fluid or edema.) 90 tablet 3   insulin aspart (NOVOLOG) 100 UNIT/ML injection Inject 0-9 Units into the skin 3 (three) times daily with meals. 10 mL 11   insulin glargine (LANTUS) 100 UNIT/ML injection Inject 8 Units into the skin at bedtime.     LACTOBACILLUS PO Take by mouth daily.     levothyroxine (SYNTHROID) 75 MCG tablet Take 1 tablet by mouth in the morning.     losartan (COZAAR) 50 MG tablet Take 1 tablet (50 mg total) by mouth 2 (two) times daily. 60 tablet 0   memantine (NAMENDA) 10 MG tablet Take 1 tablet (10 mg total) by mouth 2 (two) times daily. 60 tablet 11   metoprolol succinate (TOPROL-XL) 25 MG 24 hr tablet Take 0.5 tablets (12.5 mg total) by mouth daily. Take with or immediately following a meal. (Patient taking differently: Take 12.5 mg by mouth in the morning.) 45 tablet 3   mirtazapine (REMERON) 7.5 MG tablet Take 7.5 mg by mouth at bedtime. (Patient not taking: Reported on 07/10/2022)     Multiple Vitamins-Minerals (PRESERVISION AREDS 2 PO) Take 1 capsule by mouth in the morning.     pantoprazole (PROTONIX) 40 MG tablet Take 1 tablet (40 mg total) by mouth 2 (two) times daily before a meal. (Patient taking differently: Take 40 mg by mouth as directed. One tablet every other day) 60 tablet 0   rosuvastatin (CRESTOR) 10 MG tablet Take 10 mg  by mouth at bedtime.     No current facility-administered medications for this visit.    Review of Systems: Unable to obtain   Physical Exam  Wt Readings from Last 3 Encounters:  07/10/22 159 lb (  72.1 kg)  06/28/22 164 lb 7.4 oz (74.6 kg)  06/10/22 158 lb (71.7 kg)   BP 120/60, HR 64 LMP  (LMP Unknown)  Constitutional:  in wheelchair. In no acute distress. Psychiatric: flat affect. Rarely answers questions other than " I don't know" EENT: Pupils normal.  Conjunctivae are normal. No scleral icterus. Neck supple.  Cardiovascular: Normal rate, regular rhythm.  Pulmonary/chest: Effort normal and breath sounds normal. No wheezing, rales or rhonchi. Abdominal: Limited exam in wheelchair. Abdomen is protuberant, soft, nontender. Bowel sounds active throughout.   Willette Cluster, NP  07/15/2022, 8:33 AM  I spent 30 minutes total reviewing records, obtaining history, performing exam, counseling patient and documenting visit / findings.   Cc:  Lazoff, Shawn P, DO

## 2022-07-17 ENCOUNTER — Ambulatory Visit (HOSPITAL_BASED_OUTPATIENT_CLINIC_OR_DEPARTMENT_OTHER): Payer: Medicare PPO | Admitting: Cardiology

## 2022-07-17 ENCOUNTER — Encounter (HOSPITAL_BASED_OUTPATIENT_CLINIC_OR_DEPARTMENT_OTHER): Payer: Self-pay | Admitting: Cardiology

## 2022-07-17 VITALS — BP 142/80 | HR 57 | Ht 68.0 in

## 2022-07-17 DIAGNOSIS — R600 Localized edema: Secondary | ICD-10-CM | POA: Diagnosis not present

## 2022-07-17 DIAGNOSIS — I5032 Chronic diastolic (congestive) heart failure: Secondary | ICD-10-CM | POA: Diagnosis not present

## 2022-07-17 DIAGNOSIS — D6869 Other thrombophilia: Secondary | ICD-10-CM | POA: Diagnosis not present

## 2022-07-17 DIAGNOSIS — I10 Essential (primary) hypertension: Secondary | ICD-10-CM

## 2022-07-17 DIAGNOSIS — I4821 Permanent atrial fibrillation: Secondary | ICD-10-CM | POA: Diagnosis not present

## 2022-07-17 NOTE — Progress Notes (Signed)
Cardiology Office Note:    Date:  07/17/2022   ID:  Diana [name], DOB [birth year], MRN [id number]  PCP:  Mattie Marlin, DO  Cardiologist:  Jodelle Red, MD  Referring MD: Mattie Marlin, DO   CC: follow up  History of Present Illness:    Diana [name] is a 89 y.o. female with a hx of hypertension, type II diabetes, hyperlipidemia, atrial fibrillation who is seen for follow-up. She was initially seen 03/16/20 as a new consult at the request of Lazoff, Shawn P, DO for the evaluation and management of atrial fibrillation. She was seen in the office by Dr. Tresa Endo in 11/2016.  Currently living at [facility] independent living facility.   Previously, her caretaker reported she had been falling with no one available. She was often found on the floor. She was sent to the ER 04/2021 because she hit the back of her head. She did have a small fracture on her L ankle that was able to heal quickly. She was taken off of Eliquis due to the frequent falls. Getting up and walking around caused her to feel dizzy and winded.   At her visit 10/2021 she had 2-3 falls in the prior two months. She had random episodes of pallor, diaphoresis, and emesis about 3-4 times over 2 years. Her swelling improved with furosemide. She was also struggling with recurrent UTIs. Her Lasix was changed to prn.   She was seen in the ED 12/16/2021 for abdominal pain. CT scan showed uncomplicated diverticulitis. Her UA was suggestive of UTI. She was discharged with Augmentin and prn Zofran.  At her last visit, she denied recent falls or palpitations. Sometimes her LE swelling was "iffy." Her caretaker noted that she was not usually given her furosemide as it was listed for shortness of breath. At times she woke up with a nocturnal cough. Her caretaker was not sure if this was PND. She was not on aspirin due to anticoagulation, with high fall risk we deferred aspirin at that time.  Recently she was admitted to the  hospital 06/28/2022 after presenting to the ED after 3 episodes of diarrhea and some hematochezia. Labs with WC count normal, hemoglobin 9.1, platelet 251, potassium low at 3.1, BUN/creatinine 27/1.7. Urinalysis showed moderate leukocytes, many bacteria. Stool assay with C. difficile antigen and toxin positive. EDP discussed with infectious disease and she was started on oral vancomycin.  Today, she is accompanied by her caretaker who acts as her historian. At times her confusion seems to be worse than usual. Her caretaker believes that she has declined mentally.  She has recently struggled with recurrent UTI's and has completed multiple courses of antibiotics. We reviewed her recent hospital admission. Her caretaker denies any recent hematochezia. She is now in rehab, and is reportedly on a new antibiotic.    Lately her blood pressure has been stable. In clinic today her BP is 142/80.  There is concern regarding a possible wound on her heel. They have tried to keep her legs elevated.  No recent falls. She is now sleeping in a hospital bed.   She denies any palpitations, chest pain, shortness of breath, lightheadedness, headaches, syncope, orthopnea, or PND.   Past Medical History:  Diagnosis Date   Anemia    Anemia of decreased vitamin B12 absorption 05/26/2005   Arthritis    Atrial fib/flutter, transient (HCC)    Atrial fibrillation (HCC)    Breast cancer (HCC) 2001   Cataract    GERD (gastroesophageal  reflux disease)    Hiatal hernia    History of tobacco abuse    Hx of colonic polyps    Hyperlipidemia    Hypertension    Hypothyroidism    Memory loss    Obesity    OSA on CPAP 09/2007   AHI 10.6/hr overall, 43.64/hr during REM, lost weight, does not use cpap   Osteopenia    Osteoporosis 05/31/2015   Renal insufficiency    Type 2 diabetes mellitus [facility])     Past Surgical History:  Procedure Laterality Date   BACK SURGERY  05/30/2009   BIOPSY  01/01/2019   Procedure:  BIOPSY;  Surgeon: Rachael Fee, MD;  Location: [facility] ENDOSCOPY;  Service: Endoscopy;;   BOWEL RESECTION  1974   CATARACT EXTRACTION Bilateral    bilateral   CHOLECYSTECTOMY  1974   COLONOSCOPY     ESOPHAGOGASTRODUODENOSCOPY N/A 01/01/2019   Procedure: ESOPHAGOGASTRODUODENOSCOPY (EGD);  Surgeon: Rachael Fee, MD;  Location: [facility] ENDOSCOPY;  Service: Endoscopy;  Laterality: N/A;   EXTERNAL FIXATION REMOVAL Left 04/27/2020   Procedure: REMOVAL EXTERNAL FIXATION ARM;  Surgeon: Roby Lofts, MD;  Location: [facility] OR;  Service: Orthopedics;  Laterality: Left;   MASTECTOMY Bilateral 2001   bilateral sentinel lymph nodes bio   NM MYOCAR PERF WALL MOTION  06/2007   dipyridamole; perfusion defect in inferior myocardium consistent with diaphragmatic attenuation, remaining myocardium with no ischemia/infarct, EF 73%; normal, low risk scan    ORIF ELBOW FRACTURE Left 03/21/2020   Procedure: OPEN REDUCTION INTERNAL FIXATION (ORIF) ELBOW/OLECRANON FRACTURE;  Surgeon: Roby Lofts, MD;  Location: [facility] OR;  Service: Orthopedics;  Laterality: Left;   TOTAL ABDOMINAL HYSTERECTOMY  1975   TRANSTHORACIC ECHOCARDIOGRAM  02/2010   EF=>55%, stage 1 diastolic dysfunction; borderline RV enlargement; LA mild-mod dilated; mild mitral annular calcif & mild MR; mild TR with normal RSVP, AV moderately sclerotics    Current Medications: Current Outpatient Medications on File Prior to Visit  Medication Sig   amLODipine (NORVASC) 5 MG tablet Take 5 mg by mouth in the morning.   brimonidine-timolol (COMBIGAN) 0.2-0.5 % ophthalmic solution Place 1 drop into both eyes every 12 (twelve) hours.   Cholecalciferol (VITAMIN D) 50 MCG (2000 UT) tablet Take 2,000 Units by mouth in the morning.   Cranberry-Vitamin C-Probiotic (AZO CRANBERRY PO) Take 1 tablet by mouth in the morning and at bedtime.   doxycycline (VIBRAMYCIN) 100 MG capsule Take 100 mg by mouth 2 (two) times daily.   ferrous sulfate 325 (65 FE) MG EC tablet Take 325  mg by mouth in the morning.   furosemide (LASIX) 20 MG tablet Take 1 tablet (20 mg total) by mouth daily as needed (shortness of breath, take for 5 days). (Patient taking differently: Take 20 mg by mouth daily as needed for fluid or edema.)   insulin aspart (NOVOLOG) 100 UNIT/ML injection Inject 0-9 Units into the skin 3 (three) times daily with meals.   insulin glargine (LANTUS) 100 UNIT/ML injection Inject 8 Units into the skin at bedtime.   LACTOBACILLUS PO Take by mouth daily.   levothyroxine (SYNTHROID) 75 MCG tablet Take 1 tablet by mouth in the morning.   losartan (COZAAR) 50 MG tablet Take 1 tablet (50 mg total) by mouth 2 (two) times daily.   memantine (NAMENDA) 10 MG tablet Take 1 tablet (10 mg total) by mouth 2 (two) times daily.   metoprolol succinate (TOPROL-XL) 25 MG 24 hr tablet Take 0.5 tablets (12.5 mg total) by mouth daily. Take  with or immediately following a meal. (Patient taking differently: Take 12.5 mg by mouth in the morning.)   Multiple Vitamins-Minerals (PRESERVISION AREDS 2 PO) Take 1 capsule by mouth in the morning.   pantoprazole (PROTONIX) 40 MG tablet Take 1 tablet (40 mg total) by mouth 2 (two) times daily before a meal. (Patient taking differently: Take 40 mg by mouth as directed. One tablet every other day)   rosuvastatin (CRESTOR) 10 MG tablet Take 10 mg by mouth at bedtime.   No current facility-administered medications on file prior to visit.     Allergies:   Codeine sulfate, Sps [sodium polystyrene sulfonate], and Sulfa antibiotics   Social History   Tobacco Use   Smoking status: Former    Years: 13    Types: Cigarettes    Quit date: 05/02/2002    Years since quitting: 20.2   Smokeless tobacco: Never  Vaping Use   Vaping Use: Never used  Substance Use Topics   Alcohol use: No    Alcohol/week: 0.0 standard drinks of alcohol   Drug use: Never    Family History: family history includes Heart attack in her father and mother. There is no history of  Colon cancer.  ROS:   Please see the history of present illness. All other systems are reviewed and negative.   EKGs/Labs/Other Studies Reviewed:    The following studies were reviewed today:  Echo 08/26/20  1. Left ventricular ejection fraction, by estimation, is 70 to 75%. The  left ventricle has hyperdynamic function. The left ventricle has no  regional wall motion abnormalities. There is mild left ventricular  hypertrophy. Left ventricular diastolic  function could not be evaluated.   2. Right ventricular systolic function is mildly reduced. The right  ventricular size is normal. There is mildly elevated pulmonary artery  systolic pressure.   3. Left atrial size was severely dilated.   4. Right atrial size was mildly dilated.   5. The mitral valve is abnormal. Trivial mitral valve regurgitation.   6. The aortic valve is tricuspid. Mild sclerosis. Aortic valve  regurgitation is not visualized. Aortic valve mean gradient measures 8.0  mmHg, DI is 0.50   7. The inferior vena cava is normal in size with greater than 50%  respiratory variability, suggesting right atrial pressure of 3 mmHg.   Comparison(s): Changes from prior study are noted. 12/28/2018: LVEF  60-65%, no aortic stenosis.  CT Chest  06/30/2020: FINDINGS: Cardiovascular: Heart size is mildly enlarged. No pericardial effusion. Thoracic aorta is nonaneurysmal. Atherosclerotic calcifications of the aorta and coronary arteries. Main pulmonary trunk measures 3.4 cm in diameter.   Mediastinum/Nodes: No axillary, mediastinal, or hilar lymphadenopathy. Surgical clips present in the bilateral axillary regions. Trachea within normal limits. Moderate-sized hiatal hernia. Esophagus unremarkable.   Lungs/Pleura: Trace right pleural effusion with mild bibasilar dependent atelectasis. Mild bronchial wall thickening. Lungs are otherwise clear. No pneumothorax.   Upper Abdomen: No acute abnormality.   Musculoskeletal: No  acute osseous findings. Thoracic vertebral body heights are maintained. Mild scoliotic curvature. Multilevel degenerative disc disease. Incidental T11 vertebral body hemangioma. Advanced bilateral glenohumeral arthropathy. Bilateral mastectomies. No chest wall lesion.   IMPRESSION: 1. Trace right pleural effusion with mild bibasilar dependent atelectasis. 2. Mild cardiomegaly. 3. Moderate-sized hiatal hernia. 4. Mildly dilated main pulmonary trunk, which can be seen with pulmonary arterial hypertension. 5. Aortic and coronary artery atherosclerosis (ICD10-I70.0).    Carotid US 01/01/19 Summary:  Right Carotid: Velocities in the right ICA are consistent with a 1-39%  stenosis.   Left Carotid: Velocities in the left ICA are consistent with a 1-39%  stenosis.   Vertebrals: Bilateral vertebral arteries demonstrate antegrade flow.  Echo 12/31/18 1. Left ventricular ejection fraction, by visual estimation, is 60 to  65%. The left ventricle has normal function. Normal left ventricular size.  There is no left ventricular hypertrophy.   2. Left ventricular diastolic function could not be evaluated pattern of  LV diastolic filling.   3. Global right ventricle has normal systolic function.The right  ventricular size is not well visualized. No increase in right ventricular  wall thickness.   4. Left atrial size was normal.   5. Right atrial size was not well visualized.   6. Moderate mitral annular calcification.   7. The mitral valve was not well visualized. Trace mitral valve  regurgitation. No evidence of mitral stenosis.   8. The tricuspid valve is normal in structure. Tricuspid valve  regurgitation was not visualized by color flow Doppler.   9. The aortic valve is normal in structure. Aortic valve regurgitation  was not visualized by color flow Doppler. Structurally normal aortic  valve, with no evidence of sclerosis or stenosis.  10. The pulmonic valve was not well visualized.  Pulmonic valve  regurgitation is not visualized by color flow Doppler.  11. TR signal is inadequate for assessing pulmonary artery systolic  pressure.   EKG:  EKG is personally reviewed.   07/17/2022:  EKG was not ordered. 01/23/2022:  EKG was not ordered. 10/24/2021:  atrial fibrillation at 55 bpm, LBBB 05/14/21: atrial fibrillation at 65 bpm 03/16/20: atrial fibrillation at 75 bpm  Recent Labs: 10/20/2021: B Natriuretic Peptide 585.9 06/28/2022: ALT 14 06/29/2022: Magnesium 2.0 07/02/2022: BUN 32; Creatinine, Ser 1.93; Hemoglobin 9.9; Platelets 285; Potassium 4.9; Sodium 137   Recent Lipid Panel    Component Value Date/Time   CHOL 85 04/02/2020 0000   TRIG 58 04/02/2020 0000   HDL 35 04/02/2020 0000   CHOLHDL 2.7 07/05/2009 1005   VLDL 22 07/05/2009 1005   LDLCALC 38 04/02/2020 0000    Physical Exam:    VS:  BP (!) 142/80   Pulse (!) 57   Ht 5\' 8"  (1.727 m)   LMP  (LMP Unknown)   SpO2 99%   BMI 24.18 kg/m     Wt Readings from Last 3 Encounters:  07/15/22 159 lb (72.1 kg)  07/10/22 159 lb (72.1 kg)  06/28/22 164 lb 7.4 oz (74.6 kg)    GEN: Well nourished, well developed in no acute distress HEENT: Normal, moist mucous membranes NECK: No JVD CARDIAC: irregularly irregular rhythm, normal S1 and S2, no rubs or gallops. 2/6 systolic murmur. VASCULAR: Radial pulses 2+ bilaterally. RESPIRATORY:  Clear to auscultation without rales, wheezing or rhonchi  ABDOMEN: Soft, non-tender, non-distended MUSCULOSKELETAL: In wheelchair SKIN: Warm and dry, 1+ LE edema R>L consistent with venous insufficiency.  NEUROLOGIC:  Alert and oriented x 3. No focal neuro deficits noted. PSYCHIATRIC:  Normal affect    ASSESSMENT:    1. Permanent atrial fibrillation (HCC)   2. Chronic diastolic heart failure (HCC)   3. Secondary hypercoagulable state (HCC)   4. Bilateral leg edema   5. Primary hypertension     PLAN:    Atrial fibrillation, permanent -she is completely asymptomatic -aim for  rate control with metoprolol, bradycardia is drug induced and remains 50s-60s -normal EF on echo -was on apixaban. See prior discussion. Very difficult situation. High risk of stroke given score, below, but high risk of  life threatening injury/bleed with fall. Previously discussed with family, will not pursue anticoagulation per preference -CHA2DS2/VAS Stroke Risk Points= 6   LE edema: most consistent with venous insufficiency -has PRN lasix -aim for elevation, does not tolerate compression well  Hypertension: -on losartan, metoprolol, amlodipine -well controlled in the office generally  Dyslipidemia Type II diabetes -on rosuvastatin -was not on aspirin due to anticoagulation, with high fall risk will not add aspirin at this time -on metformin and insulin  Plan for follow up: 1 year, or sooner as needed.  Jodelle Red, MD, PhD, [facility]  [facility]    I,Mathew Stumpf,acting as a scribe for Jodelle Red, MD.,have documented all relevant documentation on the behalf of Jodelle Red, MD,as directed by  Jodelle Red, MD while in the presence of Jodelle Red, MD.  I, Jodelle Red, MD, have reviewed all documentation for this visit. The documentation on 07/17/22 for the exam, diagnosis, procedures, and orders are all accurate and complete.   Signed, Jodelle Red, MD PhD 07/17/2022     [facility]

## 2022-07-17 NOTE — Patient Instructions (Addendum)
Medication Instructions:  Your physician recommends that you continue on your current medications as directed. Please refer to the Current Medication list given to you today.  *If you need a refill on your cardiac medications before your next appointment, please call your pharmacy*  Lab Work: NONE  Testing/Procedures: NONE  Follow-Up: At [facility], you and your health needs are our priority.  As part of our continuing mission to provide you with exceptional heart care, we have created designated Provider Care Teams.  These Care Teams include your primary Cardiologist (physician) and Advanced Practice Providers (APPs -  Physician Assistants and Nurse Practitioners) who all work together to provide you with the care you need, when you need it.  We recommend signing up for the patient portal called "MyChart".  Sign up information is provided on this After Visit Summary.  MyChart is used to connect with patients for Virtual Visits (Telemedicine).  Patients are able to view lab/test results, encounter notes, upcoming appointments, etc.  Non-urgent messages can be sent to your provider as well.   To learn more about what you can do with MyChart, go to [email or web address].    Your next appointment:   12 month(s)  The format for your next appointment:   In Person  Provider:   Bridgette Christopher, MD     

## 2022-07-23 NOTE — Progress Notes (Signed)
Reviewed and agree with documentation and assessment and plan. K. Veena Ervin Hensley , MD   

## 2022-08-05 ENCOUNTER — Ambulatory Visit (HOSPITAL_BASED_OUTPATIENT_CLINIC_OR_DEPARTMENT_OTHER): Payer: Medicare PPO | Admitting: Cardiology

## 2022-09-01 ENCOUNTER — Encounter (HOSPITAL_BASED_OUTPATIENT_CLINIC_OR_DEPARTMENT_OTHER): Payer: Self-pay | Admitting: Cardiology

## 2022-11-18 ENCOUNTER — Telehealth (HOSPITAL_BASED_OUTPATIENT_CLINIC_OR_DEPARTMENT_OTHER): Payer: Self-pay | Admitting: Cardiology

## 2022-11-18 NOTE — Telephone Encounter (Signed)
Returned call to [employer] Dealer) okay per dpr, call cannot completed at this time will reattempt contact.

## 2022-11-18 NOTE — Telephone Encounter (Signed)
Pt c/o swelling/edema: STAT if pt has developed SOB within 24 hours  If swelling, where is the swelling located?   All over body  How much weight have you gained and in what time span?   Yes   Have you gained 2 pounds in a day or 5 pounds in a week?   Unknown  Do you have a log of your daily weights (if so, list)?   No, taken once a month  Are you currently taking a fluid pill?  No  Are you currently SOB?  Sometimes   Have you traveled recently in a car or plane for an extended period of time?  No  Caller Velna Hatchet) stated patient has gained 8 more since the beginning of the month.  Caller stated patient stopped taking Lasix.  Caller wants to know next steps.

## 2022-11-19 NOTE — Telephone Encounter (Signed)
2nd call attempt to Caregiver Shelia,   Caregiver states that she is having fluid build up for a while. Has been getting lasix for about a week and it didn't do much, she does not know the dose. Notes fluid all over, she states gained about 8 pounds in the last month. They only weigh patient once per month. Caregiver does not know NP name who is controlling meds.   [facility] through [facility] is where patient is staying. Contacted them with request to speak to someone on care team for patient who can provider a better update.   Spoke with Aurther Loft, patient is not on Lasix at this time due to chronic kidney disease. NP saw her yesterday and is planning to change medications, but nurse is not sure what they are going to change.  She also has a UTI.   NP will be back tomorrow, left number for NP to give [place] a call when she returns.

## 2022-11-20 MED ORDER — TORSEMIDE 20 MG PO TABS: 20.0000 mg | ORAL_TABLET | Freq: Two times a day (BID) | ORAL | Status: DC

## 2022-11-20 NOTE — Telephone Encounter (Signed)
NP of PCP by the name of Ms. Cox called nurse back and is requesting a callback at [phone]. Please advise

## 2022-11-20 NOTE — Addendum Note (Signed)
Addended by: Marlene Lard on: 11/20/2022 12:44 PM   Modules accepted: Orders

## 2022-11-20 NOTE — Telephone Encounter (Signed)
Returned call to NP,   She states she talked to sitter about issues presenting. Having lots of issues with edema, kidney issues. Was started on a short course of lasix, with no improvement. Was changed to torsemide about 2 weeks ago, 20mg  daily, with some improvement. May increase her to 20 BID, or 40QD. Has been a balancing act with her kidneys. Last significant fluid build about 2 months. No to any other symptoms. NP will continue to monitor and update [place], NP states she is in a good place right now but if something changes they will call us, wants her to schedule for January.  [employer] as she is transportation for patient to appointment, scheduled for January.

## 2023-03-19 ENCOUNTER — Ambulatory Visit (HOSPITAL_BASED_OUTPATIENT_CLINIC_OR_DEPARTMENT_OTHER): Payer: Medicare PPO | Admitting: Cardiology

## 2023-03-19 ENCOUNTER — Encounter (HOSPITAL_BASED_OUTPATIENT_CLINIC_OR_DEPARTMENT_OTHER): Payer: Self-pay | Admitting: Cardiology

## 2023-03-19 ENCOUNTER — Other Ambulatory Visit (HOSPITAL_BASED_OUTPATIENT_CLINIC_OR_DEPARTMENT_OTHER): Payer: Self-pay | Admitting: Cardiology

## 2023-03-19 VITALS — BP 102/52 | HR 46 | Ht 67.0 in

## 2023-03-19 DIAGNOSIS — I5032 Chronic diastolic (congestive) heart failure: Secondary | ICD-10-CM

## 2023-03-19 DIAGNOSIS — I4821 Permanent atrial fibrillation: Secondary | ICD-10-CM

## 2023-03-19 DIAGNOSIS — R600 Localized edema: Secondary | ICD-10-CM | POA: Diagnosis not present

## 2023-03-19 DIAGNOSIS — R001 Bradycardia, unspecified: Secondary | ICD-10-CM

## 2023-03-19 DIAGNOSIS — D6869 Other thrombophilia: Secondary | ICD-10-CM

## 2023-03-19 DIAGNOSIS — T50905D Adverse effect of unspecified drugs, medicaments and biological substances, subsequent encounter: Secondary | ICD-10-CM

## 2023-03-19 MED ORDER — TORSEMIDE 20 MG PO TABS
40.0000 mg | ORAL_TABLET | Freq: Every day | ORAL | Status: DC
Start: 2023-03-19 — End: 2023-03-20

## 2023-03-19 NOTE — Progress Notes (Signed)
 Cardiology Office Note:  .   Date:  03/19/2023  ID:  Diana [name], DOB April 25, 1933, MRN [id number] PCP: Macarthur Elouise SQUIBB, DO  [facility] Providers Cardiologist:  Shelda Bruckner, MD {  History of Present Illness: .   Diana [name] is a 89 y.o. female with a hx of hypertension, type II diabetes, hyperlipidemia, atrial fibrillation who is seen for follow-up. She was initially seen 03/16/20 as a new consult at the request of Lazoff, Shawn P, DO for the evaluation and management of atrial fibrillation. She was seen in the office by Dr. Burnard in 11/2016.   Today: Here with cargeiver today. Recently had teeth pulled. We don't have recent weights, but fluid doing much better on daily torsemide . Renal function being followed at her facility. Per her MAR, she is taking 40 mg once a day, not 20 mg BID as we have listed.  Initial BP low today. No symptoms. Reviewed med list. Amlodipine  stopped by the facility. We have her listed as 12.5 mg metoprolol  daily, but per the [facility] she is receiving 25 mg daily.  Has palliative care coming intermittently as well to check in.  No bleeding issues or recent falls.  ROS: Denies chest pain, shortness of breath at rest or with normal exertion. No PND, orthopnea, change in LE edema or unexpected weight gain. No syncope or palpitations. ROS otherwise negative except as noted.   Studies Reviewed: SABRA    EKG:       Physical Exam:   VS:  BP (!) 102/52   Pulse (!) 46   Ht 5' 7 (1.702 m)   LMP  (LMP Unknown)   SpO2 92%   BMI 24.90 kg/m    Wt Readings from Last 3 Encounters:  07/15/22 159 lb (72.1 kg)  07/10/22 159 lb (72.1 kg)  06/28/22 164 lb 7.4 oz (74.6 kg)    GEN: Well nourished, well developed in no acute distress HEENT: Normal, moist mucous membranes NECK: No JVD CARDIAC: irregularly irregular rhythm, normal S1 and S2, no rubs or gallops. 2/6 systolic murmur. VASCULAR: Radial and DP pulses 2+ bilaterally. No carotid  bruits RESPIRATORY:  Clear to auscultation without rales, wheezing or rhonchi  ABDOMEN: Soft, non-tender, non-distended MUSCULOSKELETAL:  in wheelchair SKIN: Warm and dry, bilateral TED hose in place, mild bilateral NEUROLOGIC:  Alert, interactive. No focal neuro deficits noted. PSYCHIATRIC:  Normal affect    ASSESSMENT AND PLAN: .    Atrial fibrillation, permanent -she is completely asymptomatic -aim for rate control with metoprolol , bradycardia is drug induced and remains 50s-60s -normal EF on echo -was on apixaban . See prior discussion. Very difficult situation. High risk of stroke given score, below, but high risk of life threatening injury/bleed with fall. Previously discussed with family, will not pursue anticoagulation per preference -CHA2DS2/VAS Stroke Risk Points= 6    LE edema: most consistent with venous insufficiency -doing well on torsemide  40 mg daily (based on her facility [facility]) -aim for elevation, has TED hose on today   Hypertension: -on losartan , metoprolol --we have her listed as 12.5 mg metoprolol  dose, but BUT says she is getting 25 mg daily. HR 46 today. Would recommend decreasing to 12.5 mg metoprolol  succinate -low today, recommended monitoring. If remains <110 systolic or <60 diastolic after metoprolol  decreased, would decrease losartan  to 25 mg twice a day.   Dyslipidemia Type II diabetes -on rosuvastatin  -was not on aspirin  due to anticoagulation, with high fall risk will not add aspirin  at this time -on metformin  and  insulin    Plan for follow up: 6 mos, or sooner as needed.  Signed, Shelda Bruckner, MD   Shelda Bruckner, MD, PhD, [facility]  [facility]  [place]  [facility] at [facility] at [facility] [address] [place], [place] [address] [phone]

## 2023-03-19 NOTE — Patient Instructions (Addendum)
 Medication Instructions:  Your physician recommends that you continue on your current medications as directed. Please refer to the Current Medication list given to you today.  *If you need a refill on your cardiac medications before your next appointment, please call your pharmacy*  Lab Work: NONE  Testing/Procedures: NONE  Follow-Up: At [facility], you and your health needs are our priority.  As part of our continuing mission to provide you with exceptional heart care, we have created designated Provider Care Teams.  These Care Teams include your primary Cardiologist (physician) and Advanced Practice Providers (APPs -  Physician Assistants and Nurse Practitioners) who all work together to provide you with the care you need, when you need it.  We recommend signing up for the patient portal called MyChart.  Sign up information is provided on this After Visit Summary.  MyChart is used to connect with patients for Virtual Visits (Telemedicine).  Patients are able to view lab/test results, encounter notes, upcoming appointments, etc.  Non-urgent messages can be sent to your provider as well.   To learn more about what you can do with MyChart, go to [email or web address].    Your next appointment:   6 month(s)  The format for your next appointment:   In Person  Provider:   Shelda Bruckner, MD      Other Instructions We have her listed as 12.5 mg metoprolol  dose, but BUT says she is getting 25 mg daily. HR 46 today. Would recommend decreasing to 12.5 mg metoprolol  succinate. Her blood pressure is low today, recommended monitoring. If remains <110 systolic or <60 diastolic after metoprolol  decreased, would decrease losartan  to 25 mg twice a day.

## 2023-03-20 ENCOUNTER — Other Ambulatory Visit: Payer: Self-pay | Admitting: *Deleted

## 2023-03-20 DIAGNOSIS — I5032 Chronic diastolic (congestive) heart failure: Secondary | ICD-10-CM

## 2023-03-20 MED ORDER — TORSEMIDE 20 MG PO TABS
40.0000 mg | ORAL_TABLET | Freq: Every day | ORAL | 0 refills | Status: AC
Start: 2023-03-20 — End: 2023-11-24

## 2023-05-05 ENCOUNTER — Emergency Department (HOSPITAL_COMMUNITY): Payer: Medicare PPO

## 2023-05-05 ENCOUNTER — Encounter (HOSPITAL_COMMUNITY): Payer: Self-pay

## 2023-05-05 ENCOUNTER — Emergency Department (HOSPITAL_COMMUNITY)
Admission: EM | Admit: 2023-05-05 | Discharge: 2023-05-05 | Disposition: A | Discharge status: Home / Self Care | Payer: Medicare PPO | Attending: Emergency Medicine | Admitting: Emergency Medicine

## 2023-05-05 ENCOUNTER — Other Ambulatory Visit: Payer: Self-pay

## 2023-05-05 DIAGNOSIS — F0390 Unspecified dementia without behavioral disturbance: Secondary | ICD-10-CM | POA: Insufficient documentation

## 2023-05-05 DIAGNOSIS — I509 Heart failure, unspecified: Secondary | ICD-10-CM | POA: Insufficient documentation

## 2023-05-05 DIAGNOSIS — S91301A Unspecified open wound, right foot, initial encounter: Secondary | ICD-10-CM | POA: Diagnosis present

## 2023-05-05 DIAGNOSIS — X58XXXA Exposure to other specified factors, initial encounter: Secondary | ICD-10-CM | POA: Diagnosis not present

## 2023-05-05 DIAGNOSIS — Z794 Long term (current) use of insulin: Secondary | ICD-10-CM | POA: Diagnosis not present

## 2023-05-05 LAB — BASIC METABOLIC PANEL
Anion gap: 12 (ref 5–15)
BUN: 39 mg/dL — ABNORMAL HIGH (ref 8–23)
CO2: 28 mmol/L (ref 22–32)
Calcium: 8.9 mg/dL (ref 8.9–10.3)
Chloride: 101 mmol/L (ref 98–111)
Creatinine, Ser: 2.09 mg/dL — ABNORMAL HIGH (ref 0.44–1.00)
GFR, Estimated: 23 mL/min — ABNORMAL LOW (ref >60)
Glucose, Bld: 149 mg/dL — ABNORMAL HIGH (ref 70–99)
Potassium: 4.3 mmol/L (ref 3.5–5.1)
Sodium: 141 mmol/L (ref 135–145)

## 2023-05-05 LAB — CBC WITH DIFFERENTIAL/PLATELET
Abs Immature Granulocytes: 0.02 10*3/uL (ref 0.00–0.07)
Basophils Absolute: 0 10*3/uL (ref 0.0–0.1)
Basophils Relative: 1 %
Eosinophils Absolute: 0.3 10*3/uL (ref 0.0–0.5)
Eosinophils Relative: 4 %
HCT: 35.4 % — ABNORMAL LOW (ref 36.0–46.0)
Hemoglobin: 11.2 g/dL — ABNORMAL LOW (ref 12.0–15.0)
Immature Granulocytes: 0 %
Lymphocytes Relative: 13 %
Lymphs Abs: 0.9 10*3/uL (ref 0.7–4.0)
MCH: 30.4 pg (ref 26.0–34.0)
MCHC: 31.6 g/dL (ref 30.0–36.0)
MCV: 95.9 fL (ref 80.0–100.0)
Monocytes Absolute: 0.7 10*3/uL (ref 0.1–1.0)
Monocytes Relative: 11 %
Neutro Abs: 4.6 10*3/uL (ref 1.7–7.7)
Neutrophils Relative %: 71 %
Platelets: 176 10*3/uL (ref 150–400)
RBC: 3.69 MIL/uL — ABNORMAL LOW (ref 3.87–5.11)
RDW: 14.3 % (ref 11.5–15.5)
WBC: 6.5 10*3/uL (ref 4.0–10.5)
nRBC: 0 % (ref 0.0–0.2)

## 2023-05-05 LAB — BRAIN NATRIURETIC PEPTIDE: B Natriuretic Peptide: 416.4 pg/mL — ABNORMAL HIGH (ref 0.0–100.0)

## 2023-05-05 NOTE — Discharge Instructions (Addendum)
 Clean wound and bandage twice a day. Clean with gentle soap and water,  Watch carefully for any sign of infection.   Please place pt in a heel protector boot to prevent further injury.  Follow up with primary care for recheck in 1 week.

## 2023-05-05 NOTE — ED Provider Triage Note (Signed)
 Emergency Medicine Provider Triage Evaluation Note  Diana [name] , a 89 y.o. female  was evaluated in triage.  Pt complains of blister on the right heel.  Also per EMS has increased swelling in the foot as well.  History of dementia.  Review of Systems  Positive: See above Negative: See above  Physical Exam  LMP  (LMP Unknown)  Gen:   Awake, no distress   Resp:  Normal effort  MSK:   Moves extremities without difficulty  Other:  No appreciable wound on the right heel.  Edema noted in both lower extremities.  Medical Decision Making  Medically screening exam initiated at 10:02 AM.  Appropriate orders placed.  Diana [name] was informed that the remainder of the evaluation will be completed by another provider, this initial triage assessment does not replace that evaluation, and the importance of remaining in the ED until their evaluation is complete.  See above   Gareth Eagle, PA-C 05/05/23 1003

## 2023-05-05 NOTE — ED Notes (Signed)
 Patient caregiver, Velna Hatchet, is present in ED and received report. Velna Hatchet stated she will take the patient back to facility.

## 2023-05-05 NOTE — ED Notes (Signed)
 Right foot covered with mepilex, dry gauze, and an ace wrap.

## 2023-05-05 NOTE — ED Provider Notes (Signed)
 [place] EMERGENCY DEPARTMENT AT [facility] Provider Note   CSN: [id number] Arrival date & time: 05/05/23  7829     History  Chief Complaint  Patient presents with  . Wound Infection    Diana [name] is a 89 y.o. female.  Patient sent here from the facility where she resides due to a wound on her right heel.  Patient is here with a care provider.  She is concerned wound may have come from patient's shoes.  Patient minimally ambulates mostly sits in a wheelchair.  Patient has not had any known injury to her heel.  No fever no chills.  Patient does have swelling to both of her lower extremities.  Patient has a past medical history of heart failure and A-fib.  Patient has not had her medications today.  Patient has not had any shortness of breath.  The history is provided by a caregiver. No language interpreter was used.       Home Medications Prior to Admission medications   Medication Sig Start Date End Date Taking? Authorizing Provider  brimonidine-timolol (COMBIGAN) 0.2-0.5 % ophthalmic solution Place 1 drop into both eyes every 12 (twelve) hours. 05/26/20   Medina-Vargas, Monina C, NP  Cholecalciferol (VITAMIN D) 50 MCG (2000 UT) tablet Take 2,000 Units by mouth in the morning.    [provider]  Cranberry-Vitamin C-Probiotic (AZO CRANBERRY PO) Take 1 tablet by mouth in the morning and at bedtime.    [provider]  diphenhydramine-acetaminophen (TYLENOL PM) 25-500 MG TABS tablet Take 1 tablet by mouth as needed.    [provider]  divalproex (DEPAKOTE SPRINKLE) 125 MG capsule Take 125 mg by mouth 2 (two) times daily. 02/20/23   [provider]  doxycycline (VIBRAMYCIN) 100 MG capsule Take 100 mg by mouth 2 (two) times daily. 07/11/22   [provider]  ferrous sulfate 325 (65 FE) MG EC tablet Take 325 mg by mouth in the morning.    [provider]  insulin glargine (LANTUS) 100 UNIT/ML injection Inject 8  Units into the skin at bedtime.    [provider]  LACTOBACILLUS PO Take by mouth daily.    [provider]  levothyroxine (SYNTHROID) 75 MCG tablet Take 1 tablet by mouth in the morning. 01/14/22   [provider]  losartan (COZAAR) 50 MG tablet Take 1 tablet (50 mg total) by mouth 2 (two) times daily. 05/26/20   Medina-Vargas, Monina C, NP  memantine (NAMENDA) 10 MG tablet Take 1 tablet (10 mg total) by mouth 2 (two) times daily. Patient taking differently: Take 5 mg by mouth 2 (two) times daily. 07/10/22 07/05/23  Windell Norfolk, MD  metoprolol succinate (TOPROL-XL) 25 MG 24 hr tablet Take 0.5 tablets (12.5 mg total) by mouth daily. Take with or immediately following a meal. Patient taking differently: Take 25 mg by mouth in the morning. 12/10/21 03/19/23  Jodelle Red, MD  Multiple Vitamins-Minerals (PRESERVISION AREDS 2 PO) Take 1 capsule by mouth in the morning.    [provider]  pantoprazole (PROTONIX) 40 MG tablet Take 1 tablet (40 mg total) by mouth 2 (two) times daily before a meal. Patient taking differently: Take 40 mg by mouth as directed. One tablet every other day 05/26/20   Medina-Vargas, Monina C, NP  phenazopyridine (PYRIDIUM) 200 MG tablet Take 200 mg by mouth daily. 01/02/23   [provider]  rosuvastatin (CRESTOR) 10 MG tablet Take 10 mg by mouth at bedtime.    [provider]  torsemide (DEMADEX) 20 MG tablet Take 2 tablets (40 mg total) by mouth daily. 03/20/23 06/18/23  Jodelle Red, MD  traMADol (ULTRAM) 50 MG tablet Take 50 mg by mouth every 6 (six) hours as needed for moderate pain (pain score 4-6) or severe pain (pain score 7-10). 03/17/23   [provider]      Allergies    Codeine sulfate, Sps [sodium polystyrene sulfonate], and Sulfa antibiotics    Review of Systems   Review of Systems  Constitutional:  Negative for fever.  Respiratory:  Negative for shortness of breath.   All other  systems reviewed and are negative.   Physical Exam Updated Vital Signs BP 138/83   Pulse 90   Temp 98.2 F (36.8 C)   Resp 18   LMP  (LMP Unknown)   SpO2 98%  Physical Exam Vitals and nursing note reviewed.  Constitutional:      Appearance: She is well-developed.  HENT:     Head: Normocephalic.  Cardiovascular:     Rate and Rhythm: Normal rate.  Pulmonary:     Effort: Pulmonary effort is normal.  Abdominal:     General: There is no distension.  Musculoskeletal:     Cervical back: Normal range of motion.     Comments: 3 cm blistered area right heel, no erythema no drainage, edema bilateral lower extremities.  Neurovascular neurosensory intact.  Skin:    General: Skin is warm.  Neurological:     General: No focal deficit present.     Mental Status: She is alert and oriented to person, place, and time.    ED Results / Procedures / Treatments   Labs (all labs ordered are listed, but only abnormal results are displayed) Labs Reviewed  BASIC METABOLIC PANEL - Abnormal; Notable for the following components:      Result Value   Glucose, Bld 149 (*)    BUN 39 (*)    Creatinine, Ser 2.09 (*)    GFR, Estimated 23 (*)    All other components within normal limits  CBC WITH DIFFERENTIAL/PLATELET - Abnormal; Notable for the following components:   RBC 3.69 (*)    Hemoglobin 11.2 (*)    HCT 35.4 (*)    All other components within normal limits  BRAIN NATRIURETIC PEPTIDE - Abnormal; Notable for the following components:   B Natriuretic Peptide 416.4 (*)    All other components within normal limits    EKG None  Radiology DG Chest 2 View Result Date: 05/05/2023 CLINICAL DATA:  Lower extremity edema.  Right heel blister. EXAM: CHEST - 2 VIEW COMPARISON:  Radiographs 11/16/2021 and 10/20/2021. Chest CT 06/30/2020. Abdominal CT 06/28/2022. FINDINGS: The heart size and mediastinal contours are stable with mild cardiomegaly, aortic atherosclerosis and a moderate size hiatal  hernia. Stable peripherally calcified right thyroid lesion. Mild vascular congestion and small bilateral pleural effusions. No overt pulmonary edema, confluent airspace disease or pneumothorax. Grossly stable degenerative changes in the spine status post L1 spinal augmentation. Previous bilateral breast surgery and cholecystectomy. IMPRESSION: 1. Mild vascular congestion and small bilateral pleural effusions. No overt pulmonary edema or confluent airspace disease. 2. Moderate size hiatal hernia. Electronically Signed   By: Carey Bullocks M.D.   On: 05/05/2023 11:46    Procedures Procedures    Medications Ordered in ED Medications - No data to display  ED Course/ Medical Decision Making/ A&P  Medical Decision Making Patient sent to the emergency department for evaluation of a wound on her right heel.  Amount and/or Complexity of Data Reviewed Labs: ordered. Decision-making details documented in ED Course.    Details: Labs ordered reviewed and interpreted.  Bun and creat at baseline.  No elevation of wbc count   Risk Risk Details: Pt has a pressure wound on right heel.  Wound cleaned and bandaged.  Pt will need wound care and boot to prevent pressure injury             Final Clinical Impression(s) / ED Diagnoses Final diagnoses:  Open wound of right heel, initial encounter    Rx / DC Orders ED Discharge Orders     None     An After Visit Summary was printed and given to the patient.     Elson Areas, PA-C 05/05/23 2140    Virgina Norfolk, DO 05/05/23 2256

## 2023-05-05 NOTE — ED Triage Notes (Signed)
 Pt here from nursing home for blister on rigth foot heel that busted open, increased pain. Increased edema in foot. Unsure how long blister has been there. Pt has hx of dementia.

## 2023-05-20 ENCOUNTER — Encounter (HOSPITAL_COMMUNITY): Payer: Self-pay

## 2023-05-20 ENCOUNTER — Emergency Department (HOSPITAL_COMMUNITY)

## 2023-05-20 ENCOUNTER — Other Ambulatory Visit: Payer: Self-pay

## 2023-05-20 ENCOUNTER — Emergency Department (HOSPITAL_COMMUNITY)
Admission: EM | Admit: 2023-05-20 | Discharge: 2023-05-20 | Disposition: A | Discharge status: Home / Self Care | Attending: Emergency Medicine | Admitting: Emergency Medicine

## 2023-05-20 DIAGNOSIS — R001 Bradycardia, unspecified: Secondary | ICD-10-CM | POA: Insufficient documentation

## 2023-05-20 DIAGNOSIS — E860 Dehydration: Secondary | ICD-10-CM | POA: Diagnosis not present

## 2023-05-20 LAB — COMPREHENSIVE METABOLIC PANEL
ALT: 22 U/L (ref 0–44)
AST: 22 U/L (ref 15–41)
Albumin: 2.4 g/dL — ABNORMAL LOW (ref 3.5–5.0)
Alkaline Phosphatase: 71 U/L (ref 38–126)
Anion gap: 13 (ref 5–15)
BUN: 51 mg/dL — ABNORMAL HIGH (ref 8–23)
CO2: 23 mmol/L (ref 22–32)
Calcium: 7.8 mg/dL — ABNORMAL LOW (ref 8.9–10.3)
Chloride: 103 mmol/L (ref 98–111)
Creatinine, Ser: 2.4 mg/dL — ABNORMAL HIGH (ref 0.44–1.00)
GFR, Estimated: 19 mL/min — ABNORMAL LOW (ref >60)
Glucose, Bld: 189 mg/dL — ABNORMAL HIGH (ref 70–99)
Potassium: 4.2 mmol/L (ref 3.5–5.1)
Sodium: 139 mmol/L (ref 135–145)
Total Bilirubin: 0.5 mg/dL (ref 0.0–1.2)
Total Protein: 5.7 g/dL — ABNORMAL LOW (ref 6.5–8.1)

## 2023-05-20 LAB — CBC WITH DIFFERENTIAL/PLATELET
Abs Immature Granulocytes: 0.04 10*3/uL (ref 0.00–0.07)
Basophils Absolute: 0 10*3/uL (ref 0.0–0.1)
Basophils Relative: 1 %
Eosinophils Absolute: 0.2 10*3/uL (ref 0.0–0.5)
Eosinophils Relative: 2 %
HCT: 33.7 % — ABNORMAL LOW (ref 36.0–46.0)
Hemoglobin: 10.8 g/dL — ABNORMAL LOW (ref 12.0–15.0)
Immature Granulocytes: 1 %
Lymphocytes Relative: 15 %
Lymphs Abs: 1 10*3/uL (ref 0.7–4.0)
MCH: 30.9 pg (ref 26.0–34.0)
MCHC: 32 g/dL (ref 30.0–36.0)
MCV: 96.3 fL (ref 80.0–100.0)
Monocytes Absolute: 0.9 10*3/uL (ref 0.1–1.0)
Monocytes Relative: 13 %
Neutro Abs: 4.8 10*3/uL (ref 1.7–7.7)
Neutrophils Relative %: 68 %
Platelets: 219 10*3/uL (ref 150–400)
RBC: 3.5 MIL/uL — ABNORMAL LOW (ref 3.87–5.11)
RDW: 14.2 % (ref 11.5–15.5)
WBC: 7 10*3/uL (ref 4.0–10.5)
nRBC: 0 % (ref 0.0–0.2)

## 2023-05-20 LAB — T4, FREE: Free T4: 1.03 ng/dL (ref 0.61–1.12)

## 2023-05-20 LAB — MAGNESIUM: Magnesium: 2 mg/dL (ref 1.7–2.4)

## 2023-05-20 LAB — TROPONIN I (HIGH SENSITIVITY): Troponin I (High Sensitivity): 20 ng/L — ABNORMAL HIGH (ref <18)

## 2023-05-20 LAB — TSH: TSH: 3.65 u[IU]/mL (ref 0.350–4.500)

## 2023-05-20 NOTE — Discharge Instructions (Addendum)
 The cardiologist recommended stopping your metoprolol.  Please eat and drink as well as you can for the next few days.  Please follow-up with your family doctor and cardiologist in the office.  Please return for worsening symptoms.

## 2023-05-20 NOTE — ED Notes (Signed)
 X-ray at bedside

## 2023-05-20 NOTE — ED Triage Notes (Signed)
 Pt BIB [facility] from [facility] facility d/t staff reporting she had been very lethargic the past few days. Upon EMS arrival she was presenting as encoded & was hypotensive & bradycardic. Her BP was 86/43 & pulse ranging from 30-50 bpm, 12 L good, showing some PVCs. Did receive 550cc NS via 18g Rt AC PIV & her BP was then 102/56 & pulse of 88 bpm. Also has a 22g Lt hand PIV, CBG 268 & A/Ox3 at baseline.

## 2023-05-20 NOTE — ED Provider Notes (Addendum)
 [place] EMERGENCY DEPARTMENT AT [facility] Provider Note   CSN: [id number] Arrival date & time: 05/20/23  1155     History  Chief Complaint  Patient presents with   Bradycardia   Hypotension   Fatigue    Diana [name] is a 89 y.o. female.  89 yo F with a chief complaints of fatigue.  This has been going on for a couple weeks.  She was bit more fatigued today than normal and so EMS was called.  Found to be hypotensive and bradycardic.  Had some improvement with IV fluids en route.  Patient denies any specific complaint now.  States that she has felt a little bit fatigued over the past few weeks.  Denies any new medications denies cough congestion or fever denies nausea vomiting or diarrhea.        Home Medications Prior to Admission medications   Medication Sig Start Date End Date Taking? Authorizing Provider  brimonidine-timolol (COMBIGAN) 0.2-0.5 % ophthalmic solution Place 1 drop into both eyes every 12 (twelve) hours. 05/26/20   Medina-Vargas, Monina C, NP  Cholecalciferol (VITAMIN D) 50 MCG (2000 UT) tablet Take 2,000 Units by mouth in the morning.    [provider]  Cranberry-Vitamin C-Probiotic (AZO CRANBERRY PO) Take 1 tablet by mouth in the morning and at bedtime.    [provider]  diphenhydramine-acetaminophen (TYLENOL PM) 25-500 MG TABS tablet Take 1 tablet by mouth as needed.    [provider]  divalproex (DEPAKOTE SPRINKLE) 125 MG capsule Take 125 mg by mouth 2 (two) times daily. 02/20/23   [provider]  doxycycline (VIBRAMYCIN) 100 MG capsule Take 100 mg by mouth 2 (two) times daily. 07/11/22   [provider]  ferrous sulfate 325 (65 FE) MG EC tablet Take 325 mg by mouth in the morning.    [provider]  insulin glargine (LANTUS) 100 UNIT/ML injection Inject 8 Units into the skin at bedtime.    [provider]  LACTOBACILLUS PO Take by mouth daily.    [provider]   levothyroxine (SYNTHROID) 75 MCG tablet Take 1 tablet by mouth in the morning. 01/14/22   [provider]  losartan (COZAAR) 50 MG tablet Take 1 tablet (50 mg total) by mouth 2 (two) times daily. 05/26/20   Medina-Vargas, Monina C, NP  memantine (NAMENDA) 10 MG tablet Take 1 tablet (10 mg total) by mouth 2 (two) times daily. Patient taking differently: Take 5 mg by mouth 2 (two) times daily. 07/10/22 07/05/23  Windell Norfolk, MD  Multiple Vitamins-Minerals (PRESERVISION AREDS 2 PO) Take 1 capsule by mouth in the morning.    [provider]  pantoprazole (PROTONIX) 40 MG tablet Take 1 tablet (40 mg total) by mouth 2 (two) times daily before a meal. Patient taking differently: Take 40 mg by mouth as directed. One tablet every other day 05/26/20   Medina-Vargas, Monina C, NP  phenazopyridine (PYRIDIUM) 200 MG tablet Take 200 mg by mouth daily. 01/02/23   [provider]  rosuvastatin (CRESTOR) 10 MG tablet Take 10 mg by mouth at bedtime.    [provider]  torsemide (DEMADEX) 20 MG tablet Take 2 tablets (40 mg total) by mouth daily. 03/20/23 06/18/23  Jodelle Red, MD  traMADol (ULTRAM) 50 MG tablet Take 50 mg by mouth every 6 (six) hours as needed for moderate pain (pain score 4-6) or severe pain (pain score 7-10). 03/17/23   [provider]      Allergies  Codeine sulfate, Sps [sodium polystyrene sulfonate], and Sulfa antibiotics    Review of Systems   Review of Systems  Physical Exam Updated Vital Signs BP 127/67   Pulse 65   Temp 97.8 F (36.6 C) (Oral)   Resp (!) 24   Ht 5\' 7"  (1.702 m)   Wt 77.1 kg   LMP  (LMP Unknown)   SpO2 98%   BMI 26.63 kg/m  Physical Exam Vitals and nursing note reviewed.  Constitutional:      General: She is not in acute distress.    Appearance: She is well-developed. She is not diaphoretic.  HENT:     Head: Normocephalic and atraumatic.  Eyes:     Pupils: Pupils are equal, round, and reactive to  light.  Cardiovascular:     Rate and Rhythm: Bradycardia present. Rhythm irregular.     Heart sounds: No murmur heard.    No friction rub. No gallop.  Pulmonary:     Effort: Pulmonary effort is normal.     Breath sounds: No wheezing or rales.  Abdominal:     General: There is no distension.     Palpations: Abdomen is soft.     Tenderness: There is no abdominal tenderness.  Musculoskeletal:        General: No tenderness.     Cervical back: Normal range of motion and neck supple.  Skin:    General: Skin is warm and dry.  Neurological:     Mental Status: She is alert and oriented to person, place, and time.  Psychiatric:        Behavior: Behavior normal.     ED Results / Procedures / Treatments   Labs (all labs ordered are listed, but only abnormal results are displayed) Labs Reviewed  CBC WITH DIFFERENTIAL/PLATELET - Abnormal; Notable for the following components:      Result Value   RBC 3.50 (*)    Hemoglobin 10.8 (*)    HCT 33.7 (*)    All other components within normal limits  COMPREHENSIVE METABOLIC PANEL - Abnormal; Notable for the following components:   Glucose, Bld 189 (*)    BUN 51 (*)    Creatinine, Ser 2.40 (*)    Calcium 7.8 (*)    Total Protein 5.7 (*)    Albumin 2.4 (*)    GFR, Estimated 19 (*)    All other components within normal limits  TROPONIN I (HIGH SENSITIVITY) - Abnormal; Notable for the following components:   Troponin I (High Sensitivity) 20 (*)    All other components within normal limits  MAGNESIUM  TSH  T4, FREE  URINALYSIS, ROUTINE W REFLEX MICROSCOPIC    EKG EKG Interpretation Date/Time:  Wednesday May 20 2023 12:01:57 EDT Ventricular Rate:  49 PR Interval:    QRS Duration:  156 QT Interval:  526 QTC Calculation: 517 R Axis:   -85  Text Interpretation: Atrial fibrillation Left bundle branch block Since last tracing rate slower Confirmed by Melene Plan [phone]) on 05/20/2023 12:31:17 PM  Radiology DG Chest Port 1 View Result  Date: 05/20/2023 CLINICAL DATA:  fatigue. EXAM: PORTABLE CHEST 1 VIEW COMPARISON:  05/05/2023. FINDINGS: Bilateral lung fields are clear. Bilateral costophrenic angles are clear. Stable cardio-mediastinal silhouette. No acute osseous abnormalities. The soft tissues are within normal limits. IMPRESSION: No active disease. Electronically Signed   By: Jules Schick M.D.   On: 05/20/2023 14:29    Procedures .Critical Care  Performed by: Melene Plan, DO Authorized by: Melene Plan, DO  Critical care provider statement:    Critical care time (minutes):  35   Critical care time was exclusive of:  Separately billable procedures and treating other patients   Critical care was time spent personally by me on the following activities:  Development of treatment plan with patient or surrogate, discussions with consultants, evaluation of patient's response to treatment, examination of patient, ordering and review of laboratory studies, ordering and review of radiographic studies, ordering and performing treatments and interventions, pulse oximetry, re-evaluation of patient's condition and review of old charts   Care discussed with: admitting provider       Medications Ordered in ED Medications - No data to display  ED Course/ Medical Decision Making/ A&P                                 Medical Decision Making Amount and/or Complexity of Data Reviewed Labs: ordered. Radiology: ordered.   89 yo F with a chief complaints of bradycardia and hypotension.  Had been a bit lightheaded and fatigued over the past few weeks.  EMS found him heart rate in the 30s to 50s and blood pressure in the 80s.  She had improvement of both with IV fluids.  Will obtain a laboratory evaluation here.  Thyroid studies.  Bolus of IV fluids.  Reassess.  Thyroid studies normal.  Renal function is mildly worse than baseline.  No acute anemia no significant electrolyte abnormalities.  Reassessed the patient and is feeling much  better than earlier.  Family is here and provides further history.  States that she continues look well when she left, the hospital but now feel like she is much better.  Will attempt to ambulate here.  On my record review it looks like the patient has had trouble with A-fib and has been bradycardic at her most recent visit.  She also was noted to be hypotensive.  They had documented that they wanted her to half her metoprolol and may need to stop some of her blood pressure medications if her blood pressure remained low.  I did discuss case with Dr. Herbie Baltimore, cardiology.  He thought the best course of action would be to stop the metoprolol.  Patient is continuing to feel well.  We have not yet gotten a chance to ambulate her.  The family would prefer to take her home now if they could.  2:38 PM:  I have discussed the diagnosis/risks/treatment options with the patient and family.  Evaluation and diagnostic testing in the emergency department does not suggest an emergent condition requiring admission or immediate intervention beyond what has been performed at this time.  They will follow up with Cards, PCP. We also discussed returning to the ED immediately if new or worsening sx occur. We discussed the sx which are most concerning (e.g., sudden worsening pain, fever, inability to tolerate by mouth) that necessitate immediate return. Medications administered to the patient during their visit and any new prescriptions provided to the patient are listed below.  Medications given during this visit Medications - No data to display   The patient appears reasonably screen and/or stabilized for discharge and I doubt any other medical condition or other [facility] requiring further screening, evaluation, or treatment in the ED at this time prior to discharge.          Final Clinical Impression(s) / ED Diagnoses Final diagnoses:  Bradycardia  Dehydration    Rx / DC Orders  ED Discharge Orders     None          Melene Plan, DO 05/20/23 1438    Melene Plan, DO 05/20/23 1438

## 2023-06-01 ENCOUNTER — Other Ambulatory Visit: Payer: Self-pay | Admitting: Adult Health

## 2023-06-01 DIAGNOSIS — D49512 Neoplasm of unspecified behavior of left kidney: Secondary | ICD-10-CM

## 2023-11-24 ENCOUNTER — Ambulatory Visit (HOSPITAL_BASED_OUTPATIENT_CLINIC_OR_DEPARTMENT_OTHER): Admitting: Family

## 2023-11-24 ENCOUNTER — Encounter (HOSPITAL_BASED_OUTPATIENT_CLINIC_OR_DEPARTMENT_OTHER): Payer: Self-pay | Admitting: Family

## 2023-11-24 VITALS — BP 120/70 | HR 71 | Resp 17 | Ht 67.0 in | Wt 189.0 lb

## 2023-11-24 DIAGNOSIS — I10 Essential (primary) hypertension: Secondary | ICD-10-CM | POA: Diagnosis not present

## 2023-11-24 DIAGNOSIS — I5032 Chronic diastolic (congestive) heart failure: Secondary | ICD-10-CM

## 2023-11-24 DIAGNOSIS — I4821 Permanent atrial fibrillation: Secondary | ICD-10-CM | POA: Diagnosis not present

## 2023-11-24 NOTE — Progress Notes (Signed)
 Cardiology Office Note   Date:  11/29/2023  ID:  Diana [name], DOB [birth year], MRN [id number] PCP: [facility]  [facility] Providers Cardiologist:  Shelda Bruckner, MD     History of Present Illness Diana [name] is a 89 y.o. female with hx of HTN, DM2, HLD, atrial fibrillation, diastolic heart failure.   Amlodipine  previously stopped by facility for hypotension. Followed by palliative care intermittently. Anticoagulation previously deferred due to high risk of fall.   ED visit 05/20/23 with bradycardia with fatigue. Improved with IVF. Thyroid  studies normal, renal function worse than baseline, no acute anemia. Her Metoprolol  was discontinued.   Presents today for follow up. Visit today assisted by caretaker as well. One episode of shortness of breath. She is overall sedentary and predominantly sits in her wheelchair during the day. She reports no chest pain, pressure, tightness. Her facility recently increased her Torsemide  to 40mg  daily with improvement in her LE edema.   ROS: Please see the history of present illness.    All other systems reviewed and are negative.   Studies Reviewed      Cardiac Studies & Procedures   ______________________________________________________________________________________________   STRESS TESTS  NM MYOCAR MULTI W/SPECT W 07/08/2007   ECHOCARDIOGRAM  ECHOCARDIOGRAM COMPLETE 08/26/2020  Narrative ECHOCARDIOGRAM REPORT    Patient Name:   Diana [name] Date of Exam: 08/26/2020 Medical Rec #:  [id number]            Height:       66.0 in Accession #:    [id number]           Weight:       189.8 lb Date of Birth:  12-19-1933            BSA:          1.956 m Patient Age:    87 years             BP:           123/92 mmHg Patient Gender: F                    HR:           81 bpm. Exam Location:  Inpatient  Procedure: 2D Echo, Cardiac Doppler and Color Doppler  Indications:    Murmur  History:         Patient has prior history of Echocardiogram examinations, most recent 12/28/2018. Arrythmias:Atrial Fibrillation; Risk Factors:Hypertension, Diabetes and Former Smoker.  Sonographer:    Norleen Damien Daring Referring Phys: [id number] CORT ONEIDA MANA  IMPRESSIONS   1. Left ventricular ejection fraction, by estimation, is 70 to 75%. The left ventricle has hyperdynamic function. The left ventricle has no regional wall motion abnormalities. There is mild left ventricular hypertrophy. Left ventricular diastolic function could not be evaluated. 2. Right ventricular systolic function is mildly reduced. The right ventricular size is normal. There is mildly elevated pulmonary artery systolic pressure. 3. Left atrial size was severely dilated. 4. Right atrial size was mildly dilated. 5. The mitral valve is abnormal. Trivial mitral valve regurgitation. 6. The aortic valve is tricuspid. Mild sclerosis. Aortic valve regurgitation is not visualized. Aortic valve mean gradient measures 8.0 mmHg, DI is 0.50 7. The inferior vena cava is normal in size with greater than 50% respiratory variability, suggesting right atrial pressure of 3 mmHg.  Comparison(s): Changes from prior study are noted. 12/28/2018: LVEF 60-65%, no aortic stenosis.  FINDINGS Left Ventricle: Left ventricular ejection fraction,  by estimation, is 70 to 75%. The left ventricle has hyperdynamic function. The left ventricle has no regional wall motion abnormalities. The left ventricular internal cavity size was normal in size. There is mild left ventricular hypertrophy. Abnormal (paradoxical) septal motion, consistent with left bundle branch block. Left ventricular diastolic function could not be evaluated due to atrial fibrillation. Left ventricular diastolic function could not be evaluated.  Right Ventricle: The right ventricular size is normal. No increase in right ventricular wall thickness. Right ventricular systolic function is mildly  reduced. There is mildly elevated pulmonary artery systolic pressure. The tricuspid regurgitant velocity is 3.03 m/s, and with an assumed right atrial pressure of 3 mmHg, the estimated right ventricular systolic pressure is 39.7 mmHg.  Left Atrium: Left atrial size was severely dilated.  Right Atrium: Right atrial size was mildly dilated.  Pericardium: There is no evidence of pericardial effusion.  Mitral Valve: The mitral valve is abnormal. There is moderate calcification of the anterior and posterior mitral valve leaflet(s). Mild to moderate mitral annular calcification. Trivial mitral valve regurgitation.  Tricuspid Valve: The tricuspid valve is grossly normal. Tricuspid valve regurgitation is trivial.  Aortic Valve: The aortic valve is tricuspid. Aortic valve regurgitation is not visualized. Mild aortic valve sclerosis is present, with no evidence of aortic valve stenosis. Aortic valve mean gradient measures 8.0 mmHg.  Pulmonic Valve: The pulmonic valve was normal in structure. Pulmonic valve regurgitation is not visualized.  Aorta: The aortic root and ascending aorta are structurally normal, with no evidence of dilitation.  Venous: The inferior vena cava is normal in size with greater than 50% respiratory variability, suggesting right atrial pressure of 3 mmHg.  IAS/Shunts: No atrial level shunt detected by color flow Doppler.   LEFT VENTRICLE PLAX 2D LVIDd:         4.20 cm  Diastology LVIDs:         2.80 cm  LV e' medial:    4.68 cm/s LV PW:         1.30 cm  LV E/e' medial:  28.8 LV IVS:        1.10 cm  LV e' lateral:   5.77 cm/s LVOT diam:     2.00 cm  LV E/e' lateral: 23.4 LV SV:         55 LV SV Index:   28 LVOT Area:     3.14 cm   RIGHT VENTRICLE            IVC RV S prime:     8.38 cm/s  IVC diam: 1.10 cm  LEFT ATRIUM              Index       RIGHT ATRIUM           Index LA diam:        5.00 cm  2.56 cm/m  RA Area:     20.30 cm LA Vol (A2C):   88.2 ml  45.09  ml/m RA Volume:   55.80 ml  28.53 ml/m LA Vol (A4C):   100.0 ml 51.13 ml/m LA Biplane Vol: 95.5 ml  48.83 ml/m AORTIC VALVE AV Area (Vmax):    1.32 cm AV Area (Vmean):   1.45 cm AV Area (VTI):     1.56 cm AV Vmax:           194.00 cm/s AV Vmean:          132.000 cm/s AV VTI:  0.350 m AV Peak Grad:      15.1 mmHg AV Mean Grad:      8.0 mmHg LVOT Vmax:         81.70 cm/s LVOT Vmean:        61.000 cm/s LVOT VTI:          0.174 m LVOT/AV VTI ratio: 0.50  AORTA Ao Root diam: 2.40 cm  MITRAL VALVE                TRICUSPID VALVE MV Area (PHT): 3.54 cm     TR Peak grad:   36.7 mmHg MV Decel Time: 214 msec     TR Vmax:        303.00 cm/s MV E velocity: 135.00 cm/s SHUNTS Systemic VTI:  0.17 m Systemic Diam: 2.00 cm  Vinie Maxcy MD Electronically signed by Vinie Maxcy MD Signature Date/Time: 08/26/2020/1:09:56 PM    Final          ______________________________________________________________________________________________      Risk Assessment/Calculations  CHA2DS2-VASc Score = 6   This indicates a 9.7% annual risk of stroke. The patient's score is based upon: CHF History: 1 HTN History: 1 Diabetes History: 1 Stroke History: 0 Vascular Disease History: 0 Age Score: 2 Gender Score: 1           Physical Exam VS:  BP 120/70 (BP Location: Right Arm, Patient Position: Sitting, Cuff Size: Large)   Pulse 71   Resp 17   Ht 5' 7 (1.702 m)   Wt 189 lb (85.7 kg)   LMP  (LMP Unknown)   SpO2 94%   BMI 29.60 kg/m        Wt Readings from Last 3 Encounters:  11/24/23 189 lb (85.7 kg)  05/20/23 170 lb (77.1 kg)  07/15/22 159 lb (72.1 kg)    GEN: Well nourished, well developed in no acute distress NECK: No JVD; No carotid bruits CARDIAC: IRIR, no murmurs, rubs, gallops RESPIRATORY:  Clear to auscultation without rales, wheezing or rhonchi  ABDOMEN: Soft, non-tender, non-distended EXTREMITIES:  1+ pretibial edema; No deformity   ASSESSMENT  AND PLAN  HFpEF - recent improvement in LE edema after SNF increased Torsemide  to 40mg  daily. Additional GDMT includes Losartan  50mg  daily. Leg elevation, knee high compression stockings recommended.  Will request SNF fax us  most recent labs.  Atrial fibrillation - patient and family have previously deferred OAC due to risk of falls. No AV nodal blocking therapy due to prior bradycardia.  HTN - BP well controlled. Continue current antihypertensive regimen losartan  50mg  daily, torsemide  40mg  daily.  HLD - Continue Rosuvastatin  10mg  daily.     Dispo: follow up in 6 mos  Signed, Reche GORMAN Finder, NP

## 2023-11-24 NOTE — Patient Instructions (Signed)
 Medication Instructions:  Continue your current medications *If you need a refill on your cardiac medications before your next appointment, please call your pharmacy*  Lab Work: We have asked [facility] to fax your most recent labs to our office at [phone]  Follow-Up: At [facility], you and your health needs are our priority.  As part of our continuing mission to provide you with exceptional heart care, our providers are all part of one team.  This team includes your primary Cardiologist (physician) and Advanced Practice Providers or APPs (Physician Assistants and Nurse Practitioners) who all work together to provide you with the care you need, when you need it.  Your next appointment:   6 month(s)  Provider:   Shelda Bruckner, MD or Reche Finder, NP    We recommend signing up for the patient portal called MyChart.  Sign up information is provided on this After Visit Summary.  MyChart is used to connect with patients for Virtual Visits (Telemedicine).  Patients are able to view lab/test results, encounter notes, upcoming appointments, etc.  Non-urgent messages can be sent to your provider as well.   To learn more about what you can do with MyChart, go to [email or web address].   Other Instructions  Recommend knee high compression stockings during the day. Recommend elevating lower extremities as much as possible.

## 2023-12-22 ENCOUNTER — Ambulatory Visit: Admitting: Podiatry

## 2023-12-22 DIAGNOSIS — M79672 Pain in left foot: Secondary | ICD-10-CM

## 2023-12-22 DIAGNOSIS — M79671 Pain in right foot: Secondary | ICD-10-CM | POA: Diagnosis not present

## 2023-12-22 DIAGNOSIS — L600 Ingrowing nail: Secondary | ICD-10-CM | POA: Diagnosis not present

## 2023-12-22 DIAGNOSIS — B351 Tinea unguium: Secondary | ICD-10-CM | POA: Diagnosis not present

## 2023-12-22 NOTE — Progress Notes (Signed)
 Patient presents for evaluation and treatment of tenderness and some redness around nails feet.  She went home to wash it off so the hallux nails are very thick and are probably ingrown.  Other nails also get painful with walking and wearing shoes.  Has had swelling for a long time.   Physical exam:  General appearance: Alert, pleasant, and in no acute distress.  Vascular: Pedal pulses: DP 2/4 B/L, PT 0/4 B/L.  Moderate to severe edema lower legs bilaterally.  Capillary refill time immediate bilaterally  Neurologic:    Dermatologic:  Nails thickened, disfigured, discolored 1-5 BL with subungual debris.  Redness and hypertrophic nail folds along nail folds bilaterally but no signs of drainage or infection.  Nail was again evaluated stroke-type with no hair growth lower extremities and areas of hyperpigmentation.  Musculoskeletal:  Hammertoes 2 through 5 bilaterally.  Mild hallux valgus deformity.  Normal muscle strength lower extremity bilaterally   Diagnosis: 1. Painful onychomycotic nails 1 through 5 bilaterally. 2. Pain toes 1 through 5 bilaterally.  Plan: -New patient office visit Level 3 for evaluation and management.  Modifier 25. -Discussed the ingrown hallux nails that give her problems.  We just keep these debrided.  I do not think she would be a good candidate for matrixectomy. -Debrided onychomycotic nails 1 through 5 bilaterally.  Sharply debrided nails with nail clipper and reduced with a power bur.  Return 3 months [facility]

## 2024-03-22 ENCOUNTER — Ambulatory Visit: Admitting: Podiatry

## 2024-04-05 ENCOUNTER — Ambulatory Visit: Admitting: Podiatry

## 2024-06-07 ENCOUNTER — Ambulatory Visit (HOSPITAL_BASED_OUTPATIENT_CLINIC_OR_DEPARTMENT_OTHER): Admitting: Cardiology
